# Patient Record
Sex: Male | Born: 1940 | Race: White | Hispanic: No | Marital: Married | Smoking: Former smoker
Health system: Southern US, Community
[De-identification: ages and names within clinical notes are randomized; demographics above are authoritative.]

## PROBLEM LIST (undated history)

## (undated) DIAGNOSIS — J330 Polyp of nasal cavity: Secondary | ICD-10-CM

## (undated) DIAGNOSIS — F419 Anxiety disorder, unspecified: Secondary | ICD-10-CM

## (undated) DIAGNOSIS — E785 Hyperlipidemia, unspecified: Secondary | ICD-10-CM

## (undated) DIAGNOSIS — J449 Chronic obstructive pulmonary disease, unspecified: Secondary | ICD-10-CM

## (undated) DIAGNOSIS — I2510 Atherosclerotic heart disease of native coronary artery without angina pectoris: Secondary | ICD-10-CM

## (undated) DIAGNOSIS — I739 Peripheral vascular disease, unspecified: Secondary | ICD-10-CM

## (undated) DIAGNOSIS — K219 Gastro-esophageal reflux disease without esophagitis: Secondary | ICD-10-CM

## (undated) DIAGNOSIS — I499 Cardiac arrhythmia, unspecified: Secondary | ICD-10-CM

## (undated) DIAGNOSIS — B029 Zoster without complications: Secondary | ICD-10-CM

## (undated) DIAGNOSIS — Z860101 Personal history of adenomatous and serrated colon polyps: Secondary | ICD-10-CM

## (undated) DIAGNOSIS — D509 Iron deficiency anemia, unspecified: Secondary | ICD-10-CM

## (undated) DIAGNOSIS — Z86010 Personal history of colonic polyps: Secondary | ICD-10-CM

## (undated) DIAGNOSIS — I6529 Occlusion and stenosis of unspecified carotid artery: Secondary | ICD-10-CM

## (undated) DIAGNOSIS — Z9981 Dependence on supplemental oxygen: Secondary | ICD-10-CM

## (undated) DIAGNOSIS — K922 Gastrointestinal hemorrhage, unspecified: Secondary | ICD-10-CM

## (undated) DIAGNOSIS — J920 Pleural plaque with presence of asbestos: Secondary | ICD-10-CM

## (undated) DIAGNOSIS — Z952 Presence of prosthetic heart valve: Secondary | ICD-10-CM

## (undated) DIAGNOSIS — Z87891 Personal history of nicotine dependence: Secondary | ICD-10-CM

## (undated) DIAGNOSIS — M1990 Unspecified osteoarthritis, unspecified site: Secondary | ICD-10-CM

## (undated) DIAGNOSIS — Z9289 Personal history of other medical treatment: Secondary | ICD-10-CM

## (undated) DIAGNOSIS — K5521 Angiodysplasia of colon with hemorrhage: Secondary | ICD-10-CM

## (undated) DIAGNOSIS — I10 Essential (primary) hypertension: Secondary | ICD-10-CM

## (undated) DIAGNOSIS — L719 Rosacea, unspecified: Secondary | ICD-10-CM

## (undated) HISTORY — DX: Polyp of nasal cavity: J33.0

## (undated) HISTORY — DX: Gastrointestinal hemorrhage, unspecified: K92.2

## (undated) HISTORY — DX: Peripheral vascular disease, unspecified: I73.9

## (undated) HISTORY — PX: CARDIAC CATHETERIZATION: SHX172

## (undated) HISTORY — DX: Gastro-esophageal reflux disease without esophagitis: K21.9

## (undated) HISTORY — DX: Chronic obstructive pulmonary disease, unspecified: J44.9

## (undated) HISTORY — DX: Zoster without complications: B02.9

## (undated) HISTORY — PX: TONSILLECTOMY: SUR1361

## (undated) HISTORY — DX: Occlusion and stenosis of unspecified carotid artery: I65.29

## (undated) HISTORY — DX: Cardiac arrhythmia, unspecified: I49.9

## (undated) HISTORY — DX: Unspecified osteoarthritis, unspecified site: M19.90

## (undated) HISTORY — DX: Essential (primary) hypertension: I10

## (undated) HISTORY — DX: Hyperlipidemia, unspecified: E78.5

## (undated) HISTORY — DX: Angiodysplasia of colon with hemorrhage: K55.21

## (undated) HISTORY — DX: Rosacea, unspecified: L71.9

## (undated) HISTORY — DX: Pleural plaque with presence of asbestos: J92.0

## (undated) HISTORY — DX: Iron deficiency anemia, unspecified: D50.9

## (undated) HISTORY — DX: Personal history of nicotine dependence: Z87.891

---

## 1998-08-02 ENCOUNTER — Ambulatory Visit (HOSPITAL_BASED_OUTPATIENT_CLINIC_OR_DEPARTMENT_OTHER): Admission: RE | Admit: 1998-08-02 | Discharge: 1998-08-02 | Payer: Self-pay | Admitting: Urology

## 1999-10-24 ENCOUNTER — Ambulatory Visit (HOSPITAL_COMMUNITY): Admission: RE | Admit: 1999-10-24 | Discharge: 1999-10-24 | Payer: Self-pay | Admitting: Cardiology

## 1999-12-05 ENCOUNTER — Ambulatory Visit (HOSPITAL_COMMUNITY): Admission: RE | Admit: 1999-12-05 | Discharge: 1999-12-05 | Payer: Self-pay | Admitting: *Deleted

## 1999-12-05 ENCOUNTER — Encounter (INDEPENDENT_AMBULATORY_CARE_PROVIDER_SITE_OTHER): Payer: Self-pay | Admitting: *Deleted

## 1999-12-05 ENCOUNTER — Encounter (INDEPENDENT_AMBULATORY_CARE_PROVIDER_SITE_OTHER): Payer: Self-pay

## 2001-11-15 ENCOUNTER — Ambulatory Visit: Admission: RE | Admit: 2001-11-15 | Discharge: 2001-11-15 | Payer: Self-pay | Admitting: Family Medicine

## 2002-06-09 ENCOUNTER — Ambulatory Visit (HOSPITAL_BASED_OUTPATIENT_CLINIC_OR_DEPARTMENT_OTHER): Admission: RE | Admit: 2002-06-09 | Discharge: 2002-06-09 | Payer: Self-pay | Admitting: *Deleted

## 2002-11-24 ENCOUNTER — Encounter: Payer: Self-pay | Admitting: Family Medicine

## 2002-11-24 ENCOUNTER — Encounter: Admission: RE | Admit: 2002-11-24 | Discharge: 2002-11-24 | Payer: Self-pay | Admitting: Family Medicine

## 2003-02-22 ENCOUNTER — Encounter (INDEPENDENT_AMBULATORY_CARE_PROVIDER_SITE_OTHER): Payer: Self-pay | Admitting: Specialist

## 2003-02-22 ENCOUNTER — Encounter (INDEPENDENT_AMBULATORY_CARE_PROVIDER_SITE_OTHER): Payer: Self-pay | Admitting: *Deleted

## 2003-02-22 ENCOUNTER — Ambulatory Visit (HOSPITAL_COMMUNITY): Admission: RE | Admit: 2003-02-22 | Discharge: 2003-02-22 | Payer: Self-pay | Admitting: *Deleted

## 2003-03-24 HISTORY — PX: ILIAC ARTERY STENT: SHX1786

## 2003-07-25 ENCOUNTER — Ambulatory Visit (HOSPITAL_COMMUNITY): Admission: RE | Admit: 2003-07-25 | Discharge: 2003-07-25 | Payer: Self-pay | Admitting: Vascular Surgery

## 2004-12-26 ENCOUNTER — Encounter: Admission: RE | Admit: 2004-12-26 | Discharge: 2004-12-26 | Payer: Self-pay | Admitting: Family Medicine

## 2005-11-27 ENCOUNTER — Ambulatory Visit: Payer: Self-pay | Admitting: Family Medicine

## 2005-12-04 ENCOUNTER — Encounter: Admission: RE | Admit: 2005-12-04 | Discharge: 2005-12-04 | Payer: Self-pay | Admitting: Family Medicine

## 2005-12-11 ENCOUNTER — Ambulatory Visit: Payer: Self-pay | Admitting: Family Medicine

## 2005-12-21 ENCOUNTER — Encounter: Admission: RE | Admit: 2005-12-21 | Discharge: 2005-12-21 | Payer: Self-pay | Admitting: Family Medicine

## 2006-02-26 ENCOUNTER — Ambulatory Visit: Payer: Self-pay | Admitting: Family Medicine

## 2006-02-26 LAB — CONVERTED CEMR LAB
ALT: 41 units/L — ABNORMAL HIGH (ref 0–40)
AST: 25 units/L (ref 0–37)
BUN: 10 mg/dL (ref 6–23)
CO2: 27 meq/L (ref 19–32)
Calcium: 9.8 mg/dL (ref 8.4–10.5)
Chloride: 101 meq/L (ref 96–112)
Chol/HDL Ratio, serum: 2.8
Cholesterol: 130 mg/dL (ref 0–200)
Creatinine, Ser: 0.8 mg/dL (ref 0.4–1.5)
GFR calc non Af Amer: 102 mL/min
Glomerular Filtration Rate, Af Am: 124 mL/min/{1.73_m2}
Glucose, Bld: 105 mg/dL — ABNORMAL HIGH (ref 70–99)
HDL: 46.1 mg/dL (ref >39.0)
LDL Cholesterol: 64 mg/dL (ref 0–99)
Potassium: 4.2 meq/L (ref 3.5–5.1)
Sodium: 135 meq/L (ref 135–145)
Triglyceride fasting, serum: 102 mg/dL (ref 0–149)
VLDL: 20 mg/dL (ref 0–40)

## 2006-04-16 ENCOUNTER — Ambulatory Visit: Payer: Self-pay | Admitting: Family Medicine

## 2006-04-16 LAB — CONVERTED CEMR LAB
HCT: 44.6 % (ref 39.0–52.0)
Hemoglobin: 15.4 g/dL (ref 13.0–17.0)
INR: 1 (ref 0.9–2.0)
MCHC: 34.4 g/dL (ref 30.0–36.0)
MCV: 95.2 fL (ref 78.0–100.0)
Platelets: 276 10*3/uL (ref 150–400)
Prothrombin Time: 12.5 s (ref 10.0–14.0)
RBC: 4.69 M/uL (ref 4.22–5.81)
RDW: 12.1 % (ref 11.5–14.6)
WBC: 9.1 10*3/uL (ref 4.5–10.5)

## 2006-05-25 ENCOUNTER — Ambulatory Visit: Payer: Self-pay | Admitting: Family Medicine

## 2006-06-02 ENCOUNTER — Ambulatory Visit: Payer: Self-pay | Admitting: Family Medicine

## 2006-06-08 ENCOUNTER — Ambulatory Visit: Payer: Self-pay | Admitting: Family Medicine

## 2006-06-25 ENCOUNTER — Encounter: Admission: RE | Admit: 2006-06-25 | Discharge: 2006-06-25 | Payer: Self-pay | Admitting: Family Medicine

## 2006-08-11 DIAGNOSIS — L719 Rosacea, unspecified: Secondary | ICD-10-CM

## 2006-08-11 DIAGNOSIS — J449 Chronic obstructive pulmonary disease, unspecified: Secondary | ICD-10-CM

## 2006-08-11 DIAGNOSIS — F528 Other sexual dysfunction not due to a substance or known physiological condition: Secondary | ICD-10-CM

## 2006-08-11 DIAGNOSIS — Z8719 Personal history of other diseases of the digestive system: Secondary | ICD-10-CM

## 2006-08-11 DIAGNOSIS — D126 Benign neoplasm of colon, unspecified: Secondary | ICD-10-CM

## 2006-08-11 DIAGNOSIS — F17210 Nicotine dependence, cigarettes, uncomplicated: Secondary | ICD-10-CM

## 2006-08-11 DIAGNOSIS — I10 Essential (primary) hypertension: Secondary | ICD-10-CM

## 2006-08-11 DIAGNOSIS — E785 Hyperlipidemia, unspecified: Secondary | ICD-10-CM | POA: Insufficient documentation

## 2006-08-11 HISTORY — DX: Chronic obstructive pulmonary disease, unspecified: J44.9

## 2006-10-01 ENCOUNTER — Telehealth (INDEPENDENT_AMBULATORY_CARE_PROVIDER_SITE_OTHER): Payer: Self-pay | Admitting: *Deleted

## 2006-10-01 ENCOUNTER — Ambulatory Visit: Payer: Self-pay | Admitting: Family Medicine

## 2006-10-01 LAB — CONVERTED CEMR LAB
ALT: 37 units/L (ref 0–53)
AST: 23 units/L (ref 0–37)
BUN: 9 mg/dL (ref 6–23)
CO2: 28 meq/L (ref 19–32)
Calcium: 10.1 mg/dL (ref 8.4–10.5)
Chloride: 99 meq/L (ref 96–112)
Cholesterol: 137 mg/dL (ref 0–200)
Creatinine, Ser: 0.6 mg/dL (ref 0.4–1.5)
GFR calc Af Amer: 172 mL/min
GFR calc non Af Amer: 142 mL/min
Glucose, Bld: 97 mg/dL (ref 70–99)
HDL: 55.1 mg/dL (ref >39.0)
LDL Cholesterol: 57 mg/dL (ref 0–99)
PSA: 0.92 ng/mL (ref 0.10–4.00)
Potassium: 4.2 meq/L (ref 3.5–5.1)
Sodium: 134 meq/L — ABNORMAL LOW (ref 135–145)
Total CHOL/HDL Ratio: 2.5
Triglycerides: 125 mg/dL (ref 0–149)
VLDL: 25 mg/dL (ref 0–40)

## 2006-11-05 ENCOUNTER — Ambulatory Visit: Payer: Self-pay | Admitting: Family Medicine

## 2006-12-17 ENCOUNTER — Ambulatory Visit: Payer: Self-pay | Admitting: Family Medicine

## 2007-01-28 ENCOUNTER — Telehealth (INDEPENDENT_AMBULATORY_CARE_PROVIDER_SITE_OTHER): Payer: Self-pay | Admitting: *Deleted

## 2007-01-28 ENCOUNTER — Encounter (INDEPENDENT_AMBULATORY_CARE_PROVIDER_SITE_OTHER): Payer: Self-pay | Admitting: *Deleted

## 2007-01-28 ENCOUNTER — Ambulatory Visit: Payer: Self-pay | Admitting: Family Medicine

## 2007-01-28 LAB — CONVERTED CEMR LAB
BUN: 13 mg/dL (ref 6–23)
CO2: 28 meq/L (ref 19–32)
Calcium: 9.7 mg/dL (ref 8.4–10.5)
Chloride: 98 meq/L (ref 96–112)
Creatinine, Ser: 0.7 mg/dL (ref 0.4–1.5)
GFR calc Af Amer: 144 mL/min
GFR calc non Af Amer: 119 mL/min
Glucose, Bld: 95 mg/dL (ref 70–99)
Potassium: 3.9 meq/L (ref 3.5–5.1)
Sodium: 133 meq/L — ABNORMAL LOW (ref 135–145)

## 2007-06-03 ENCOUNTER — Ambulatory Visit: Payer: Self-pay | Admitting: Family Medicine

## 2007-06-03 LAB — CONVERTED CEMR LAB
ALT: 38 units/L (ref 0–53)
AST: 25 units/L (ref 0–37)
BUN: 9 mg/dL (ref 6–23)
CO2: 27 meq/L (ref 19–32)
Calcium: 9.9 mg/dL (ref 8.4–10.5)
Chloride: 104 meq/L (ref 96–112)
Cholesterol: 126 mg/dL (ref 0–200)
Creatinine, Ser: 0.7 mg/dL (ref 0.4–1.5)
GFR calc Af Amer: 144 mL/min
GFR calc non Af Amer: 119 mL/min
Glucose, Bld: 94 mg/dL (ref 70–99)
HDL: 46.2 mg/dL (ref >39.0)
LDL Cholesterol: 62 mg/dL (ref 0–99)
Potassium: 4.4 meq/L (ref 3.5–5.1)
Sodium: 137 meq/L (ref 135–145)
Total CHOL/HDL Ratio: 2.7
Triglycerides: 90 mg/dL (ref 0–149)
VLDL: 18 mg/dL (ref 0–40)

## 2007-06-06 ENCOUNTER — Encounter (INDEPENDENT_AMBULATORY_CARE_PROVIDER_SITE_OTHER): Payer: Self-pay | Admitting: *Deleted

## 2007-06-22 ENCOUNTER — Ambulatory Visit: Payer: Self-pay | Admitting: Family Medicine

## 2007-06-22 DIAGNOSIS — M542 Cervicalgia: Secondary | ICD-10-CM

## 2007-06-24 ENCOUNTER — Encounter: Admission: RE | Admit: 2007-06-24 | Discharge: 2007-06-24 | Payer: Self-pay | Admitting: Family Medicine

## 2007-06-24 DIAGNOSIS — I7789 Other specified disorders of arteries and arterioles: Secondary | ICD-10-CM

## 2007-06-27 ENCOUNTER — Telehealth (INDEPENDENT_AMBULATORY_CARE_PROVIDER_SITE_OTHER): Payer: Self-pay | Admitting: *Deleted

## 2007-06-27 ENCOUNTER — Encounter (INDEPENDENT_AMBULATORY_CARE_PROVIDER_SITE_OTHER): Payer: Self-pay | Admitting: *Deleted

## 2007-07-04 ENCOUNTER — Telehealth (INDEPENDENT_AMBULATORY_CARE_PROVIDER_SITE_OTHER): Payer: Self-pay | Admitting: *Deleted

## 2007-07-06 ENCOUNTER — Ambulatory Visit: Payer: Self-pay

## 2007-07-06 ENCOUNTER — Encounter: Payer: Self-pay | Admitting: Family Medicine

## 2007-07-12 ENCOUNTER — Encounter: Payer: Self-pay | Admitting: Family Medicine

## 2007-07-14 ENCOUNTER — Encounter (INDEPENDENT_AMBULATORY_CARE_PROVIDER_SITE_OTHER): Payer: Self-pay | Admitting: *Deleted

## 2007-07-25 ENCOUNTER — Telehealth (INDEPENDENT_AMBULATORY_CARE_PROVIDER_SITE_OTHER): Payer: Self-pay | Admitting: *Deleted

## 2007-07-25 DIAGNOSIS — J330 Polyp of nasal cavity: Secondary | ICD-10-CM | POA: Insufficient documentation

## 2007-08-01 ENCOUNTER — Encounter: Payer: Self-pay | Admitting: Family Medicine

## 2007-08-17 ENCOUNTER — Encounter: Payer: Self-pay | Admitting: Family Medicine

## 2007-11-24 ENCOUNTER — Encounter: Payer: Self-pay | Admitting: Family Medicine

## 2007-11-25 ENCOUNTER — Ambulatory Visit: Payer: Self-pay | Admitting: Family Medicine

## 2007-11-28 LAB — CONVERTED CEMR LAB
ALT: 38 units/L (ref 0–53)
AST: 23 units/L (ref 0–37)
Albumin: 3.8 g/dL (ref 3.5–5.2)
Alkaline Phosphatase: 62 units/L (ref 39–117)
BUN: 10 mg/dL (ref 6–23)
Bilirubin, Direct: 0.1 mg/dL (ref 0.0–0.3)
CO2: 28 meq/L (ref 19–32)
Calcium: 9.6 mg/dL (ref 8.4–10.5)
Chloride: 105 meq/L (ref 96–112)
Cholesterol: 118 mg/dL (ref 0–200)
Creatinine, Ser: 0.7 mg/dL (ref 0.4–1.5)
GFR calc Af Amer: 144 mL/min
GFR calc non Af Amer: 119 mL/min
Glucose, Bld: 81 mg/dL (ref 70–99)
HDL: 43.4 mg/dL (ref >39.0)
LDL Cholesterol: 60 mg/dL (ref 0–99)
Potassium: 4.3 meq/L (ref 3.5–5.1)
Sodium: 136 meq/L (ref 135–145)
Total Bilirubin: 0.7 mg/dL (ref 0.3–1.2)
Total CHOL/HDL Ratio: 2.7
Total Protein: 7.4 g/dL (ref 6.0–8.3)
Triglycerides: 74 mg/dL (ref 0–149)
VLDL: 15 mg/dL (ref 0–40)

## 2007-11-29 ENCOUNTER — Encounter (INDEPENDENT_AMBULATORY_CARE_PROVIDER_SITE_OTHER): Payer: Self-pay | Admitting: *Deleted

## 2007-12-02 ENCOUNTER — Telehealth: Payer: Self-pay | Admitting: Family Medicine

## 2007-12-14 ENCOUNTER — Ambulatory Visit: Payer: Self-pay | Admitting: Family Medicine

## 2008-03-23 DIAGNOSIS — K922 Gastrointestinal hemorrhage, unspecified: Secondary | ICD-10-CM

## 2008-03-23 HISTORY — DX: Gastrointestinal hemorrhage, unspecified: K92.2

## 2008-05-25 ENCOUNTER — Ambulatory Visit: Payer: Self-pay | Admitting: Family Medicine

## 2008-05-25 ENCOUNTER — Encounter (INDEPENDENT_AMBULATORY_CARE_PROVIDER_SITE_OTHER): Payer: Self-pay | Admitting: *Deleted

## 2008-05-25 DIAGNOSIS — R0989 Other specified symptoms and signs involving the circulatory and respiratory systems: Secondary | ICD-10-CM

## 2008-06-01 ENCOUNTER — Telehealth (INDEPENDENT_AMBULATORY_CARE_PROVIDER_SITE_OTHER): Payer: Self-pay | Admitting: *Deleted

## 2008-06-01 ENCOUNTER — Encounter: Admission: RE | Admit: 2008-06-01 | Discharge: 2008-06-01 | Payer: Self-pay | Admitting: Family Medicine

## 2008-06-15 ENCOUNTER — Telehealth (INDEPENDENT_AMBULATORY_CARE_PROVIDER_SITE_OTHER): Payer: Self-pay | Admitting: *Deleted

## 2008-06-16 LAB — CONVERTED CEMR LAB
ALT: 40 units/L (ref 0–53)
AST: 23 units/L (ref 0–37)
Albumin: 3.9 g/dL (ref 3.5–5.2)
Alkaline Phosphatase: 68 units/L (ref 39–117)
BUN: 13 mg/dL (ref 6–23)
Basophils Absolute: 0 10*3/uL (ref 0.0–0.1)
Basophils Relative: 0.1 % (ref 0.0–3.0)
Bilirubin, Direct: 0.1 mg/dL (ref 0.0–0.3)
CO2: 27 meq/L (ref 19–32)
Calcium: 9.7 mg/dL (ref 8.4–10.5)
Chloride: 101 meq/L (ref 96–112)
Cholesterol: 126 mg/dL (ref 0–200)
Creatinine, Ser: 0.6 mg/dL (ref 0.4–1.5)
Eosinophils Absolute: 0.3 10*3/uL (ref 0.0–0.7)
Eosinophils Relative: 3.2 % (ref 0.0–5.0)
GFR calc Af Amer: 172 mL/min
GFR calc non Af Amer: 142 mL/min
Glucose, Bld: 89 mg/dL (ref 70–99)
HCT: 35.2 % — ABNORMAL LOW (ref 39.0–52.0)
HDL: 49.2 mg/dL (ref >39.0)
Hemoglobin: 11.4 g/dL — ABNORMAL LOW (ref 13.0–17.0)
LDL Cholesterol: 61 mg/dL (ref 0–99)
Lymphocytes Relative: 14.9 % (ref 12.0–46.0)
MCHC: 32.3 g/dL (ref 30.0–36.0)
MCV: 78.9 fL (ref 78.0–100.0)
Monocytes Absolute: 1.2 10*3/uL — ABNORMAL HIGH (ref 0.1–1.0)
Monocytes Relative: 11.9 % (ref 3.0–12.0)
Neutro Abs: 7.1 10*3/uL (ref 1.4–7.7)
Neutrophils Relative %: 69.9 % (ref 43.0–77.0)
PSA: 1.03 ng/mL (ref 0.10–4.00)
Platelets: 329 10*3/uL (ref 150–400)
Potassium: 4.4 meq/L (ref 3.5–5.1)
RBC: 4.46 M/uL (ref 4.22–5.81)
RDW: 18.3 % — ABNORMAL HIGH (ref 11.5–14.6)
Sodium: 135 meq/L (ref 135–145)
Total Bilirubin: 0.8 mg/dL (ref 0.3–1.2)
Total CHOL/HDL Ratio: 2.6
Total Protein: 7.6 g/dL (ref 6.0–8.3)
Triglycerides: 78 mg/dL (ref 0–149)
VLDL: 16 mg/dL (ref 0–40)
WBC: 10.1 10*3/uL (ref 4.5–10.5)

## 2008-06-19 ENCOUNTER — Encounter (INDEPENDENT_AMBULATORY_CARE_PROVIDER_SITE_OTHER): Payer: Self-pay | Admitting: *Deleted

## 2008-07-04 ENCOUNTER — Encounter: Payer: Self-pay | Admitting: Family Medicine

## 2008-07-04 ENCOUNTER — Ambulatory Visit: Payer: Self-pay

## 2008-07-12 ENCOUNTER — Encounter (INDEPENDENT_AMBULATORY_CARE_PROVIDER_SITE_OTHER): Payer: Self-pay | Admitting: *Deleted

## 2008-09-21 ENCOUNTER — Ambulatory Visit: Payer: Self-pay | Admitting: Family Medicine

## 2008-09-26 ENCOUNTER — Telehealth (INDEPENDENT_AMBULATORY_CARE_PROVIDER_SITE_OTHER): Payer: Self-pay | Admitting: *Deleted

## 2008-09-28 ENCOUNTER — Encounter: Payer: Self-pay | Admitting: Family Medicine

## 2008-09-30 LAB — CONVERTED CEMR LAB
ALT: 35 units/L (ref 0–53)
AST: 27 units/L (ref 0–37)
Albumin: 3.8 g/dL (ref 3.5–5.2)
Alkaline Phosphatase: 53 units/L (ref 39–117)
Basophils Absolute: 0 10*3/uL (ref 0.0–0.1)
Basophils Relative: 0 % (ref 0.0–3.0)
Bilirubin, Direct: 0 mg/dL (ref 0.0–0.3)
Cholesterol: 149 mg/dL (ref 0–200)
Eosinophils Absolute: 0.5 10*3/uL (ref 0.0–0.7)
Eosinophils Relative: 6.3 % — ABNORMAL HIGH (ref 0.0–5.0)
HCT: 36.1 % — ABNORMAL LOW (ref 39.0–52.0)
HDL: 49.4 mg/dL (ref >39.00)
Hemoglobin: 11.6 g/dL — ABNORMAL LOW (ref 13.0–17.0)
Iron: 18 ug/dL — ABNORMAL LOW (ref 42–165)
LDL Cholesterol: 81 mg/dL (ref 0–99)
Lymphocytes Relative: 17.2 % (ref 12.0–46.0)
Lymphs Abs: 1.5 10*3/uL (ref 0.7–4.0)
MCHC: 32 g/dL (ref 30.0–36.0)
MCV: 81.3 fL (ref 78.0–100.0)
Monocytes Absolute: 1.1 10*3/uL — ABNORMAL HIGH (ref 0.1–1.0)
Monocytes Relative: 13 % — ABNORMAL HIGH (ref 3.0–12.0)
Neutro Abs: 5.4 10*3/uL (ref 1.4–7.7)
Neutrophils Relative %: 63.5 % (ref 43.0–77.0)
Platelets: 318 10*3/uL (ref 150.0–400.0)
RBC: 4.44 M/uL (ref 4.22–5.81)
RDW: 16.9 % — ABNORMAL HIGH (ref 11.5–14.6)
Saturation Ratios: 3 % — ABNORMAL LOW (ref 20.0–50.0)
Total Bilirubin: 0.6 mg/dL (ref 0.3–1.2)
Total CHOL/HDL Ratio: 3
Total Protein: 7.8 g/dL (ref 6.0–8.3)
Transferrin: 424.6 mg/dL — ABNORMAL HIGH (ref 212.0–360.0)
Triglycerides: 91 mg/dL (ref 0.0–149.0)
VLDL: 18.2 mg/dL (ref 0.0–40.0)
Vitamin B-12: 411 pg/mL (ref 211–911)
WBC: 8.5 10*3/uL (ref 4.5–10.5)

## 2008-10-02 ENCOUNTER — Encounter (INDEPENDENT_AMBULATORY_CARE_PROVIDER_SITE_OTHER): Payer: Self-pay | Admitting: *Deleted

## 2008-10-09 ENCOUNTER — Encounter: Payer: Self-pay | Admitting: Family Medicine

## 2008-10-09 ENCOUNTER — Encounter (INDEPENDENT_AMBULATORY_CARE_PROVIDER_SITE_OTHER): Payer: Self-pay | Admitting: *Deleted

## 2008-10-09 ENCOUNTER — Ambulatory Visit (HOSPITAL_COMMUNITY): Admission: RE | Admit: 2008-10-09 | Discharge: 2008-10-09 | Payer: Self-pay | Admitting: *Deleted

## 2008-11-30 ENCOUNTER — Telehealth: Payer: Self-pay | Admitting: Family Medicine

## 2008-11-30 ENCOUNTER — Ambulatory Visit: Payer: Self-pay | Admitting: Family Medicine

## 2008-11-30 DIAGNOSIS — K219 Gastro-esophageal reflux disease without esophagitis: Secondary | ICD-10-CM

## 2008-11-30 HISTORY — DX: Gastro-esophageal reflux disease without esophagitis: K21.9

## 2008-12-01 ENCOUNTER — Telehealth: Payer: Self-pay | Admitting: Family Medicine

## 2008-12-01 ENCOUNTER — Ambulatory Visit: Payer: Self-pay | Admitting: Family Medicine

## 2008-12-01 DIAGNOSIS — D649 Anemia, unspecified: Secondary | ICD-10-CM

## 2008-12-03 ENCOUNTER — Ambulatory Visit: Payer: Self-pay | Admitting: Internal Medicine

## 2008-12-03 ENCOUNTER — Inpatient Hospital Stay (HOSPITAL_COMMUNITY): Admission: AD | Admit: 2008-12-03 | Discharge: 2008-12-06 | Payer: Self-pay | Admitting: Family Medicine

## 2008-12-03 ENCOUNTER — Ambulatory Visit: Payer: Self-pay | Admitting: Family Medicine

## 2008-12-03 ENCOUNTER — Encounter (INDEPENDENT_AMBULATORY_CARE_PROVIDER_SITE_OTHER): Payer: Self-pay | Admitting: *Deleted

## 2008-12-03 ENCOUNTER — Ambulatory Visit: Payer: Self-pay | Admitting: Cardiovascular Disease

## 2008-12-03 DIAGNOSIS — D500 Iron deficiency anemia secondary to blood loss (chronic): Secondary | ICD-10-CM

## 2008-12-03 DIAGNOSIS — K922 Gastrointestinal hemorrhage, unspecified: Secondary | ICD-10-CM

## 2008-12-05 ENCOUNTER — Encounter: Payer: Self-pay | Admitting: Internal Medicine

## 2008-12-07 ENCOUNTER — Encounter: Payer: Self-pay | Admitting: Internal Medicine

## 2008-12-14 ENCOUNTER — Ambulatory Visit: Payer: Self-pay | Admitting: Family Medicine

## 2008-12-18 ENCOUNTER — Telehealth: Payer: Self-pay | Admitting: Family Medicine

## 2008-12-18 LAB — CONVERTED CEMR LAB
Basophils Absolute: 0 10*3/uL (ref 0.0–0.1)
Basophils Relative: 0.1 % (ref 0.0–3.0)
Eosinophils Absolute: 0.4 10*3/uL (ref 0.0–0.7)
Eosinophils Relative: 4.3 % (ref 0.0–5.0)
HCT: 37.4 % — ABNORMAL LOW (ref 39.0–52.0)
Hemoglobin: 12.1 g/dL — ABNORMAL LOW (ref 13.0–17.0)
Lymphocytes Relative: 12.6 % (ref 12.0–46.0)
Lymphs Abs: 1.1 10*3/uL (ref 0.7–4.0)
MCHC: 32.3 g/dL (ref 30.0–36.0)
MCV: 82 fL (ref 78.0–100.0)
Monocytes Absolute: 1 10*3/uL (ref 0.1–1.0)
Monocytes Relative: 11.3 % (ref 3.0–12.0)
Neutro Abs: 6.1 10*3/uL (ref 1.4–7.7)
Neutrophils Relative %: 71.7 % (ref 43.0–77.0)
Platelets: 417 10*3/uL — ABNORMAL HIGH (ref 150.0–400.0)
RBC: 4.56 M/uL (ref 4.22–5.81)
RDW: 20.4 % — ABNORMAL HIGH (ref 11.5–14.6)
WBC: 8.6 10*3/uL (ref 4.5–10.5)

## 2008-12-21 ENCOUNTER — Ambulatory Visit: Payer: Self-pay | Admitting: Family Medicine

## 2008-12-26 LAB — CONVERTED CEMR LAB
Basophils Absolute: 0 10*3/uL (ref 0.0–0.1)
Basophils Relative: 0.1 % (ref 0.0–3.0)
Eosinophils Absolute: 0.5 10*3/uL (ref 0.0–0.7)
Eosinophils Relative: 5.5 % — ABNORMAL HIGH (ref 0.0–5.0)
HCT: 36.8 % — ABNORMAL LOW (ref 39.0–52.0)
Hemoglobin: 12 g/dL — ABNORMAL LOW (ref 13.0–17.0)
Lymphocytes Relative: 14 % (ref 12.0–46.0)
Lymphs Abs: 1.4 10*3/uL (ref 0.7–4.0)
MCHC: 32.8 g/dL (ref 30.0–36.0)
MCV: 84 fL (ref 78.0–100.0)
Monocytes Absolute: 0.9 10*3/uL (ref 0.1–1.0)
Monocytes Relative: 9.1 % (ref 3.0–12.0)
Neutro Abs: 7.1 10*3/uL (ref 1.4–7.7)
Neutrophils Relative %: 71.3 % (ref 43.0–77.0)
Platelets: 466 10*3/uL — ABNORMAL HIGH (ref 150.0–400.0)
RBC: 4.38 M/uL (ref 4.22–5.81)
RDW: 21.8 % — ABNORMAL HIGH (ref 11.5–14.6)
WBC: 9.9 10*3/uL (ref 4.5–10.5)

## 2009-01-04 ENCOUNTER — Ambulatory Visit: Payer: Self-pay | Admitting: Family Medicine

## 2009-01-04 LAB — CONVERTED CEMR LAB
Basophils Absolute: 0 10*3/uL (ref 0.0–0.1)
Basophils Relative: 0.1 % (ref 0.0–3.0)
Eosinophils Absolute: 0.6 10*3/uL (ref 0.0–0.7)
Eosinophils Relative: 7.9 % — ABNORMAL HIGH (ref 0.0–5.0)
HCT: 38.4 % — ABNORMAL LOW (ref 39.0–52.0)
Hemoglobin: 12.7 g/dL — ABNORMAL LOW (ref 13.0–17.0)
Lymphocytes Relative: 14 % (ref 12.0–46.0)
Lymphs Abs: 1.1 10*3/uL (ref 0.7–4.0)
MCHC: 33.1 g/dL (ref 30.0–36.0)
MCV: 87.5 fL (ref 78.0–100.0)
Monocytes Absolute: 0.7 10*3/uL (ref 0.1–1.0)
Monocytes Relative: 9 % (ref 3.0–12.0)
Neutro Abs: 5.6 10*3/uL (ref 1.4–7.7)
Neutrophils Relative %: 69 % (ref 43.0–77.0)
Platelets: 199 10*3/uL (ref 150.0–400.0)
RBC: 4.39 M/uL (ref 4.22–5.81)
RDW: 25.1 % — ABNORMAL HIGH (ref 11.5–14.6)
WBC: 8 10*3/uL (ref 4.5–10.5)

## 2009-01-18 ENCOUNTER — Ambulatory Visit: Payer: Self-pay | Admitting: Family Medicine

## 2009-01-21 ENCOUNTER — Encounter (INDEPENDENT_AMBULATORY_CARE_PROVIDER_SITE_OTHER): Payer: Self-pay | Admitting: *Deleted

## 2009-01-21 LAB — CONVERTED CEMR LAB
Basophils Absolute: 0 10*3/uL (ref 0.0–0.1)
Basophils Relative: 0 % (ref 0.0–3.0)
Eosinophils Absolute: 0.6 10*3/uL (ref 0.0–0.7)
Eosinophils Relative: 6 % — ABNORMAL HIGH (ref 0.0–5.0)
HCT: 38.6 % — ABNORMAL LOW (ref 39.0–52.0)
Hemoglobin: 13.3 g/dL (ref 13.0–17.0)
Lymphocytes Relative: 13 % (ref 12.0–46.0)
Lymphs Abs: 1.3 10*3/uL (ref 0.7–4.0)
MCHC: 34.5 g/dL (ref 30.0–36.0)
MCV: 91.2 fL (ref 78.0–100.0)
Monocytes Absolute: 0.9 10*3/uL (ref 0.1–1.0)
Monocytes Relative: 9.1 % (ref 3.0–12.0)
Neutro Abs: 7.2 10*3/uL (ref 1.4–7.7)
Neutrophils Relative %: 71.9 % (ref 43.0–77.0)
Platelets: 300 10*3/uL (ref 150.0–400.0)
RBC: 4.23 M/uL (ref 4.22–5.81)
RDW: 25 % — ABNORMAL HIGH (ref 11.5–14.6)
WBC: 10 10*3/uL (ref 4.5–10.5)

## 2009-01-25 ENCOUNTER — Ambulatory Visit: Payer: Self-pay | Admitting: Internal Medicine

## 2009-01-25 DIAGNOSIS — K31819 Angiodysplasia of stomach and duodenum without bleeding: Secondary | ICD-10-CM | POA: Insufficient documentation

## 2009-01-25 DIAGNOSIS — Z860100 Personal history of colon polyps, unspecified: Secondary | ICD-10-CM | POA: Insufficient documentation

## 2009-01-25 DIAGNOSIS — D509 Iron deficiency anemia, unspecified: Secondary | ICD-10-CM | POA: Insufficient documentation

## 2009-01-25 DIAGNOSIS — K5520 Angiodysplasia of colon without hemorrhage: Secondary | ICD-10-CM | POA: Insufficient documentation

## 2009-01-25 HISTORY — DX: Iron deficiency anemia, unspecified: D50.9

## 2009-02-22 ENCOUNTER — Ambulatory Visit: Payer: Self-pay | Admitting: Family Medicine

## 2009-02-25 LAB — CONVERTED CEMR LAB
Basophils Absolute: 0 10*3/uL (ref 0.0–0.1)
Basophils Relative: 0.1 % (ref 0.0–3.0)
Eosinophils Absolute: 0.5 10*3/uL (ref 0.0–0.7)
Eosinophils Relative: 4.9 % (ref 0.0–5.0)
HCT: 41.5 % (ref 39.0–52.0)
Hemoglobin: 13.9 g/dL (ref 13.0–17.0)
Lymphocytes Relative: 13.8 % (ref 12.0–46.0)
Lymphs Abs: 1.3 10*3/uL (ref 0.7–4.0)
MCHC: 33.6 g/dL (ref 30.0–36.0)
MCV: 97.3 fL (ref 78.0–100.0)
Monocytes Absolute: 1 10*3/uL (ref 0.1–1.0)
Monocytes Relative: 10.4 % (ref 3.0–12.0)
Neutro Abs: 6.8 10*3/uL (ref 1.4–7.7)
Neutrophils Relative %: 70.8 % (ref 43.0–77.0)
Platelets: 278 10*3/uL (ref 150.0–400.0)
RBC: 4.27 M/uL (ref 4.22–5.81)
RDW: 17.2 % — ABNORMAL HIGH (ref 11.5–14.6)
WBC: 9.6 10*3/uL (ref 4.5–10.5)

## 2009-04-05 ENCOUNTER — Encounter (INDEPENDENT_AMBULATORY_CARE_PROVIDER_SITE_OTHER): Payer: Self-pay | Admitting: *Deleted

## 2009-04-25 ENCOUNTER — Encounter (INDEPENDENT_AMBULATORY_CARE_PROVIDER_SITE_OTHER): Payer: Self-pay | Admitting: *Deleted

## 2009-04-26 ENCOUNTER — Ambulatory Visit: Payer: Self-pay | Admitting: Internal Medicine

## 2009-05-03 ENCOUNTER — Ambulatory Visit: Payer: Self-pay | Admitting: Internal Medicine

## 2009-05-03 LAB — HM COLONOSCOPY

## 2009-05-31 ENCOUNTER — Ambulatory Visit: Payer: Self-pay | Admitting: Family Medicine

## 2009-06-10 LAB — CONVERTED CEMR LAB
ALT: 51 units/L (ref 0–53)
AST: 32 units/L (ref 0–37)
Albumin: 4.2 g/dL (ref 3.5–5.2)
Alkaline Phosphatase: 64 units/L (ref 39–117)
BUN: 10 mg/dL (ref 6–23)
Basophils Absolute: 0 10*3/uL (ref 0.0–0.1)
Basophils Relative: 0.1 % (ref 0.0–3.0)
Bilirubin, Direct: 0.1 mg/dL (ref 0.0–0.3)
CO2: 28 meq/L (ref 19–32)
Calcium: 9.9 mg/dL (ref 8.4–10.5)
Chloride: 103 meq/L (ref 96–112)
Cholesterol: 128 mg/dL (ref 0–200)
Creatinine, Ser: 0.6 mg/dL (ref 0.4–1.5)
Eosinophils Absolute: 0.4 10*3/uL (ref 0.0–0.7)
Eosinophils Relative: 4.2 % (ref 0.0–5.0)
Ferritin: 61.6 ng/mL (ref 22.0–322.0)
Folate: >20 ng/mL
GFR calc non Af Amer: 141.25 mL/min (ref >60)
Glucose, Bld: 92 mg/dL (ref 70–99)
HCT: 44.7 % (ref 39.0–52.0)
HDL: 56.3 mg/dL (ref >39.00)
Hemoglobin: 15.2 g/dL (ref 13.0–17.0)
Iron: 122 ug/dL (ref 42–165)
LDL Cholesterol: 47 mg/dL (ref 0–99)
Lymphocytes Relative: 12.4 % (ref 12.0–46.0)
Lymphs Abs: 1.2 10*3/uL (ref 0.7–4.0)
MCHC: 33.9 g/dL (ref 30.0–36.0)
MCV: 99.1 fL (ref 78.0–100.0)
Monocytes Absolute: 1.1 10*3/uL — ABNORMAL HIGH (ref 0.1–1.0)
Monocytes Relative: 11.6 % (ref 3.0–12.0)
Neutro Abs: 6.7 10*3/uL (ref 1.4–7.7)
Neutrophils Relative %: 71.7 % (ref 43.0–77.0)
Platelets: 246 10*3/uL (ref 150.0–400.0)
Potassium: 4.4 meq/L (ref 3.5–5.1)
RBC: 4.52 M/uL (ref 4.22–5.81)
RDW: 12.5 % (ref 11.5–14.6)
Saturation Ratios: 27 % (ref 20.0–50.0)
Sodium: 134 meq/L — ABNORMAL LOW (ref 135–145)
Total Bilirubin: 0.7 mg/dL (ref 0.3–1.2)
Total CHOL/HDL Ratio: 2
Total Protein: 8.1 g/dL (ref 6.0–8.3)
Transferrin: 322.6 mg/dL (ref 212.0–360.0)
Triglycerides: 125 mg/dL (ref 0.0–149.0)
VLDL: 25 mg/dL (ref 0.0–40.0)
Vitamin B-12: 363 pg/mL (ref 211–911)
WBC: 9.4 10*3/uL (ref 4.5–10.5)

## 2009-07-18 ENCOUNTER — Encounter: Payer: Self-pay | Admitting: Family Medicine

## 2009-07-19 ENCOUNTER — Ambulatory Visit: Payer: Self-pay

## 2009-07-19 ENCOUNTER — Encounter: Payer: Self-pay | Admitting: Family Medicine

## 2009-07-23 ENCOUNTER — Telehealth (INDEPENDENT_AMBULATORY_CARE_PROVIDER_SITE_OTHER): Payer: Self-pay | Admitting: *Deleted

## 2009-11-29 ENCOUNTER — Ambulatory Visit: Payer: Self-pay | Admitting: Family Medicine

## 2009-12-05 LAB — CONVERTED CEMR LAB
ALT: 50 units/L (ref 0–53)
AST: 30 units/L (ref 0–37)
Albumin: 4.3 g/dL (ref 3.5–5.2)
Alkaline Phosphatase: 59 units/L (ref 39–117)
BUN: 12 mg/dL (ref 6–23)
Basophils Absolute: 0.1 10*3/uL (ref 0.0–0.1)
Basophils Relative: 0.6 % (ref 0.0–3.0)
Bilirubin, Direct: 0.1 mg/dL (ref 0.0–0.3)
CO2: 25 meq/L (ref 19–32)
Calcium: 10.5 mg/dL (ref 8.4–10.5)
Chloride: 100 meq/L (ref 96–112)
Cholesterol: 139 mg/dL (ref 0–200)
Creatinine, Ser: 0.6 mg/dL (ref 0.4–1.5)
Eosinophils Absolute: 0.4 10*3/uL (ref 0.0–0.7)
Eosinophils Relative: 3.2 % (ref 0.0–5.0)
Ferritin: 32 ng/mL (ref 22.0–322.0)
Folate: >20 ng/mL
GFR calc non Af Amer: 152.73 mL/min (ref >60)
Glucose, Bld: 85 mg/dL (ref 70–99)
HCT: 44 % (ref 39.0–52.0)
HDL: 47.6 mg/dL (ref >39.00)
Hemoglobin: 15.1 g/dL (ref 13.0–17.0)
Iron: 64 ug/dL (ref 42–165)
LDL Cholesterol: 67 mg/dL (ref 0–99)
Lymphocytes Relative: 11.8 % — ABNORMAL LOW (ref 12.0–46.0)
Lymphs Abs: 1.4 10*3/uL (ref 0.7–4.0)
MCHC: 34.3 g/dL (ref 30.0–36.0)
MCV: 99.7 fL (ref 78.0–100.0)
Monocytes Absolute: 1.2 10*3/uL — ABNORMAL HIGH (ref 0.1–1.0)
Monocytes Relative: 10.2 % (ref 3.0–12.0)
Neutro Abs: 8.8 10*3/uL — ABNORMAL HIGH (ref 1.4–7.7)
Neutrophils Relative %: 74.2 % (ref 43.0–77.0)
PSA: 0.97 ng/mL (ref 0.10–4.00)
Platelets: 273 10*3/uL (ref 150.0–400.0)
Potassium: 4.6 meq/L (ref 3.5–5.1)
RBC: 4.42 M/uL (ref 4.22–5.81)
RDW: 13.2 % (ref 11.5–14.6)
Saturation Ratios: 12.8 % — ABNORMAL LOW (ref 20.0–50.0)
Sodium: 135 meq/L (ref 135–145)
Total Bilirubin: 0.5 mg/dL (ref 0.3–1.2)
Total CHOL/HDL Ratio: 3
Total Protein: 7.4 g/dL (ref 6.0–8.3)
Transferrin: 355.8 mg/dL (ref 212.0–360.0)
Triglycerides: 124 mg/dL (ref 0.0–149.0)
VLDL: 24.8 mg/dL (ref 0.0–40.0)
Vitamin B-12: 467 pg/mL (ref 211–911)
WBC: 11.8 10*3/uL — ABNORMAL HIGH (ref 4.5–10.5)

## 2010-01-17 ENCOUNTER — Encounter: Payer: Self-pay | Admitting: Family Medicine

## 2010-01-17 ENCOUNTER — Telehealth (INDEPENDENT_AMBULATORY_CARE_PROVIDER_SITE_OTHER): Payer: Self-pay | Admitting: *Deleted

## 2010-01-17 ENCOUNTER — Ambulatory Visit: Payer: Self-pay | Admitting: Family Medicine

## 2010-01-17 ENCOUNTER — Encounter (INDEPENDENT_AMBULATORY_CARE_PROVIDER_SITE_OTHER): Payer: Self-pay | Admitting: *Deleted

## 2010-01-17 DIAGNOSIS — D7289 Other specified disorders of white blood cells: Secondary | ICD-10-CM

## 2010-01-17 DIAGNOSIS — K921 Melena: Secondary | ICD-10-CM

## 2010-01-20 LAB — CONVERTED CEMR LAB
Basophils Absolute: 0.1 10*3/uL (ref 0.0–0.1)
Eosinophils Absolute: 0.3 10*3/uL (ref 0.0–0.7)
Eosinophils Relative: 3.1 % (ref 0.0–5.0)
Hemoglobin: 15.3 g/dL (ref 13.0–17.0)
Lymphs Abs: 1.2 10*3/uL (ref 0.7–4.0)
Monocytes Absolute: 1.1 10*3/uL — ABNORMAL HIGH (ref 0.1–1.0)
Monocytes Relative: 11.5 % (ref 3.0–12.0)
Neutrophils Relative %: 72.9 % (ref 43.0–77.0)
Platelets: 253 10*3/uL (ref 150.0–400.0)
RBC: 4.4 M/uL (ref 4.22–5.81)
RDW: 13.7 % (ref 11.5–14.6)
WBC: 9.9 10*3/uL (ref 4.5–10.5)

## 2010-01-24 ENCOUNTER — Ambulatory Visit: Payer: Self-pay

## 2010-01-24 ENCOUNTER — Encounter: Payer: Self-pay | Admitting: Family Medicine

## 2010-02-27 ENCOUNTER — Telehealth (INDEPENDENT_AMBULATORY_CARE_PROVIDER_SITE_OTHER): Payer: Self-pay | Admitting: *Deleted

## 2010-03-11 ENCOUNTER — Encounter (INDEPENDENT_AMBULATORY_CARE_PROVIDER_SITE_OTHER): Payer: Self-pay | Admitting: *Deleted

## 2010-03-14 ENCOUNTER — Ambulatory Visit: Payer: Self-pay | Admitting: Internal Medicine

## 2010-03-14 ENCOUNTER — Telehealth: Payer: Self-pay | Admitting: Internal Medicine

## 2010-03-21 ENCOUNTER — Ambulatory Visit: Payer: Self-pay | Admitting: Internal Medicine

## 2010-03-23 HISTORY — PX: ESOPHAGOGASTRODUODENOSCOPY: SHX1529

## 2010-04-20 LAB — CONVERTED CEMR LAB
ALT: 32 units/L (ref 0–53)
AST: 29 units/L (ref 0–37)
Albumin: 3.9 g/dL (ref 3.5–5.2)
Alkaline Phosphatase: 54 units/L (ref 39–117)
BUN: 10 mg/dL (ref 6–23)
Basophils Absolute: 0 10*3/uL (ref 0.0–0.1)
Basophils Relative: 0.1 % (ref 0.0–3.0)
Bilirubin Urine: NEGATIVE
Bilirubin, Direct: 0 mg/dL (ref 0.0–0.3)
Blood in Urine, dipstick: NEGATIVE
CO2: 24 meq/L (ref 19–32)
Calcium: 9.5 mg/dL (ref 8.4–10.5)
Chloride: 105 meq/L (ref 96–112)
Cholesterol: 112 mg/dL (ref 0–200)
Creatinine, Ser: 0.7 mg/dL (ref 0.4–1.5)
Eosinophils Absolute: 0.2 10*3/uL (ref 0.0–0.7)
Eosinophils Relative: 2.2 % (ref 0.0–5.0)
GFR calc non Af Amer: 118.4 mL/min (ref >60)
Glucose, Bld: 107 mg/dL — ABNORMAL HIGH (ref 70–99)
Glucose, Urine, Semiquant: NEGATIVE
HCT: 21.7 % — CL (ref 39.0–52.0)
HDL: 39.8 mg/dL (ref >39.00)
Hemoglobin: 7 g/dL — CL (ref 13.0–17.0)
Ketones, urine, test strip: NEGATIVE
LDL Cholesterol: 54 mg/dL (ref 0–99)
Lymphocytes Relative: 11.2 % — ABNORMAL LOW (ref 12.0–46.0)
Lymphs Abs: 1.2 10*3/uL (ref 0.7–4.0)
MCHC: 32.4 g/dL (ref 30.0–36.0)
MCV: 74.5 fL — ABNORMAL LOW (ref 78.0–100.0)
Monocytes Absolute: 1.1 10*3/uL — ABNORMAL HIGH (ref 0.1–1.0)
Monocytes Relative: 10.7 % (ref 3.0–12.0)
Neutro Abs: 7.8 10*3/uL — ABNORMAL HIGH (ref 1.4–7.7)
Neutrophils Relative %: 75.8 % (ref 43.0–77.0)
Nitrite: NEGATIVE
PSA: 0.9 ng/mL (ref 0.10–4.00)
Platelets: 381 10*3/uL (ref 150.0–400.0)
Potassium: 4.3 meq/L (ref 3.5–5.1)
Protein, U semiquant: NEGATIVE
RBC: 2.91 M/uL — ABNORMAL LOW (ref 4.22–5.81)
RDW: 16.6 % — ABNORMAL HIGH (ref 11.5–14.6)
Sodium: 135 meq/L (ref 135–145)
Specific Gravity, Urine: <1.005
Total Bilirubin: 0.8 mg/dL (ref 0.3–1.2)
Total CHOL/HDL Ratio: 3
Total Protein: 7.3 g/dL (ref 6.0–8.3)
Triglycerides: 89 mg/dL (ref 0.0–149.0)
Urobilinogen, UA: NEGATIVE
VLDL: 17.8 mg/dL (ref 0.0–40.0)
WBC Urine, dipstick: NEGATIVE
WBC: 10.3 10*3/uL (ref 4.5–10.5)
pH: 7.5

## 2010-04-22 NOTE — Assessment & Plan Note (Signed)
Summary: 6 month ov//ph   Vital Signs:  Patient profile:   70 year old male Weight:      134.4 pounds Pulse rate:   88 / minute BP sitting:   110 / 62  (left arm) Cuff size:   regular  Vitals Entered By: Shonna Chock (May 31, 2009 8:04 AM) CC: 6 Month follow-up, refill all meds (local pharmacy) Comments REVIEWED MED LIST, PATIENT AGREED DOSE AND INSTRUCTION CORRECT    History of Present Illness:  Hypertension follow-up      This is a 70 year old man who presents for Hypertension follow-up.  The patient denies lightheadedness, urinary frequency, headaches, edema, impotence, rash, and fatigue.  The patient denies the following associated symptoms: chest pain, chest pressure, exercise intolerance, dyspnea, palpitations, syncope, leg edema, and pedal edema.  Compliance with medications (by patient report) has been near 100%.  The patient reports that dietary compliance has been good.  The patient reports exercising occasionally.  Adjunctive measures currently used by the patient include salt restriction.    Hyperlipidemia follow-up      The patient also presents for Hyperlipidemia follow-up.  The patient denies muscle aches, GI upset, abdominal pain, flushing, itching, constipation, diarrhea, and fatigue.  The patient denies the following symptoms: chest pain/pressure, exercise intolerance, dypsnea, palpitations, syncope, and pedal edema.  Compliance with medications (by patient report) has been near 100%.  Dietary compliance has been good.  The patient reports exercising occasionally.  Adjunctive measures currently used by the patient include fiber and ASA.    Pt had colonoscopy in February and is off dexilant now---repeat 1 year. Pt also knows he needs to quit smoking.  Preventive Screening-Counseling & Management  Alcohol-Tobacco     Alcohol drinks/day: 2     Alcohol type: beer     Smoking Status: current     Smoking Cessation Counseling: yes     Smoke Cessation Stage: ready  Packs/Day: 1.0     Year Started: 1957  Caffeine-Diet-Exercise     Caffeine use/day: 2     Does Patient Exercise: no  Current Medications (verified): 1)  Advair Diskus 250-50 Mcg/dose  Misc (Fluticasone-Salmeterol) .... Take One Puff Twice Daily 2)  Lisinopril-Hydrochlorothiazide 20-12.5 Mg  Tabs (Lisinopril-Hydrochlorothiazide) .... Take One Tablet Daily 3)  Amlodipine Besylate 10 Mg  Tabs (Amlodipine Besylate) .... Take One Tablet Daily 4)  Lipitor 40 Mg  Tabs (Atorvastatin Calcium) .... Take One Tablet Daily 5)  Baby Aspirin 81 Mg  Chew (Aspirin) .... One Tablet By Mouth Once Daily 6)  Viagra 100 Mg  Tabs (Sildenafil Citrate) .Marland Kitchen.. 1 By Mouth Once Daily Prn 7)  Centrum Silver  Tabs (Multiple Vitamins-Minerals) .... Once Daily 8)  Iron 325 (65 Fe) Mg Tabs (Ferrous Sulfate) .Marland Kitchen.. 1 By Mouth Three Times A Day 9)  Vitamin C 500 Mg Tabs (Ascorbic Acid) .Marland Kitchen.. 1 By Mouth Two Times A Day  Allergies (verified): No Known Drug Allergies  Past History:  Past medical, surgical, family and social histories (including risk factors) reviewed for relevance to current acute and chronic problems.  Past Medical History: Reviewed history from 01/25/2009 and no changes required. 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 iliac stent  colon surgery  Family History: Reviewed history from 01/25/2009 and no changes required. M--Lung disease Family History Uterine cancer F--colitis No FH of Colon Cancer:  Social History: Reviewed  history from 01/25/2009 and no changes required. Occupation: Nurse, adult Married with 4 children Current Smoker: 2ppd Alcohol use-yes: beer Drug use-no Illicit Drug Use - no  Review of Systems      See HPI  Physical Exam  General:  Well-developed,well-nourished,in no acute distress; alert,appropriate and cooperative throughout examination Lungs:  Normal respiratory effort, chest expands symmetrically. Lungs are clear to auscultation, no crackles or wheezes. Heart:  Grade  2 /6 systolic ejection murmur.   Extremities:  No clubbing, cyanosis, edema, or deformity noted with normal full range of motion of all joints.   Psych:  Oriented X3 and normally interactive.     Impression & Recommendations:  Problem # 1:  ANEMIA-IRON DEFICIENCY (ICD-280.9)  His updated medication list for this problem includes:    Iron 325 (65 Fe) Mg Tabs (Ferrous sulfate) .Marland Kitchen... 1 by mouth three times a day  Orders: Venipuncture ([address]) TLB-Lipid Panel (80061-LIPID) TLB-BMP (Basic Metabolic Panel-BMET) (80048-METABOL) TLB-Hepatic/Liver Function Pnl (80076-HEPATIC) TLB-IBC Pnl (Iron/FE;Transferrin) (83550-IBC) TLB-CBC Platelet - w/Differential (85025-CBCD) TLB-B12 + Folate Pnl (60454_09811-B14/NWG) TLB-Ferritin (82728-FER)  Problem # 2:  TOBACCO ABUSE, HX OF (ICD-V15.82)  Orders: Tobacco use cessation intermediate 3-10 minutes ([address])  Problem # 3:  HYPERTENSION (ICD-401.9)  His updated medication list for this problem includes:    Lisinopril-hydrochlorothiazide 20-12.5 Mg Tabs (Lisinopril-hydrochlorothiazide) .Marland Kitchen... Take one tablet daily    Amlodipine Besylate 10 Mg Tabs (Amlodipine besylate) .Marland Kitchen... Take one tablet daily  Orders: Venipuncture (30865) TLB-Lipid Panel (80061-LIPID) TLB-BMP (Basic Metabolic Panel-BMET) (80048-METABOL) TLB-Hepatic/Liver Function Pnl (80076-HEPATIC) TLB-IBC Pnl (Iron/FE;Transferrin) (83550-IBC) TLB-CBC Platelet - w/Differential (85025-CBCD) TLB-B12 +  Folate Pnl (78469_62952-W41/LKG) TLB-Ferritin (82728-FER) Tobacco use cessation intermediate 3-10 minutes (99406)  BP today: 110/62 Prior BP: 128/74 (01/25/2009)  Labs Reviewed: K+: 4.3 (11/30/2008) Creat: : 0.7 (11/30/2008)   Chol: 112 (11/30/2008)   HDL: 39.80 (11/30/2008)   LDL: 54 (11/30/2008)   TG: 89.0 (11/30/2008)  Problem # 4:  HYPERLIPIDEMIA (ICD-272.4)  His updated medication list for this problem includes:    Lipitor 40 Mg Tabs (Atorvastatin calcium) .Marland Kitchen... Take one tablet daily  Orders: Venipuncture (40102) TLB-Lipid Panel (80061-LIPID) TLB-BMP (Basic Metabolic Panel-BMET) (80048-METABOL) TLB-Hepatic/Liver Function Pnl (80076-HEPATIC) TLB-IBC Pnl (Iron/FE;Transferrin) (83550-IBC) TLB-CBC Platelet - w/Differential (85025-CBCD) TLB-B12 + Folate Pnl (72536_64403-K74/QVZ) TLB-Ferritin (82728-FER) Tobacco use cessation intermediate 3-10 minutes (56387)  Labs Reviewed: SGOT: 29 (11/30/2008)   SGPT: 32 (11/30/2008)   HDL:39.80 (11/30/2008), 49.40 (09/21/2008)  LDL:54 (11/30/2008), 81 (09/21/2008)  Chol:112 (11/30/2008), 149 (09/21/2008)  Trig:89.0 (11/30/2008), 91.0 (09/21/2008)  Complete Medication List: 1)  Advair Diskus 250-50 Mcg/dose Misc (Fluticasone-salmeterol) .... Take one puff twice daily 2)  Lisinopril-hydrochlorothiazide 20-12.5 Mg Tabs (Lisinopril-hydrochlorothiazide) .... Take one tablet daily 3)  Amlodipine Besylate 10 Mg Tabs (Amlodipine besylate) .... Take one tablet daily 4)  Lipitor 40 Mg Tabs (Atorvastatin calcium) .... Take one tablet daily 5)  Baby Aspirin 81 Mg Chew (Aspirin) .... One tablet by mouth once daily 6)  Viagra 100 Mg Tabs (Sildenafil citrate) .Marland Kitchen.. 1 by mouth once daily prn 7)  Centrum Silver Tabs (Multiple vitamins-minerals) .... Once daily 8)  Iron 325 (65 Fe) Mg Tabs (Ferrous sulfate) .Marland Kitchen.. 1 by mouth three times a day 9)  Vitamin C 500 Mg Tabs (Ascorbic acid) .Marland Kitchen.. 1 by mouth two times a day 10)  Chantix Continuing Month Pak 1 Mg Tabs  (Varenicline tartrate) .Marland KitchenMarland KitchenMarland Kitchen  As directed Prescriptions: CHANTIX CONTINUING MONTH PAK 1 MG TABS (VARENICLINE TARTRATE) as directed  #1 x 3   Entered and Authorized by:   Loreen Freud DO   Signed by:   Loreen Freud DO on 05/31/2009   Method used:   Print then Give to Patient   RxID:   [id number] LIPITOR 40 MG  TABS (ATORVASTATIN CALCIUM) TAKE ONE TABLET DAILY  #30 Tablet x 5   Entered and Authorized by:   Loreen Freud DO   Signed by:   Loreen Freud DO on 05/31/2009   Method used:   Electronically to        [id number]* (retail)       [address]       [place], [place]  [address]       Ph: [phone] or [phone]       Fax: [phone]   RxID:   [id number] AMLODIPINE BESYLATE 10 MG  TABS (AMLODIPINE BESYLATE) TAKE ONE TABLET DAILY  #30 Tablet x 5   Entered and Authorized by:   Loreen Freud DO   Signed by:   Loreen Freud DO on 05/31/2009   Method used:   Electronically to        [id number]* (retail)       [address]       [place], [place]  [address]       Ph: [phone] or [phone]       Fax: [phone]   RxID:   [id number] LISINOPRIL-HYDROCHLOROTHIAZIDE 20-12.5 MG  TABS (LISINOPRIL-HYDROCHLOROTHIAZIDE) TAKE ONE TABLET DAILY  #30 Tablet x 5   Entered and Authorized by:   Loreen Freud DO   Signed by:   Loreen Freud DO on 05/31/2009   Method used:   Electronically to        [id number]* (retail)       [address]       [place], [place]  [address]       Ph: [phone] or [phone]       Fax: [phone]   RxID:   [id number] ADVAIR DISKUS 250-50 MCG/DOSE  MISC (FLUTICASONE-SALMETEROL) TAKE ONE PUFF TWICE DAILY  #1 x 11   Entered and Authorized by:   Loreen Freud DO   Signed by:   Loreen Freud DO on 05/31/2009   Method used:   Electronically to        [id number]* (retail)       [address]        [place], [place]  [address]       Ph: [phone] or [phone]       Fax: [phone]   RxID:   [id number]   Last Colonoscopy:  DONE (05/03/2009 12:44:00 PM) Colonoscopy Result Date:  05/03/2009 Colonoscopy Result:  polyp- repeat 1 year  Appended Document: Orders Update     Clinical Lists Changes  Orders: Added new Service order of Prescription Created Electronically [phone]) - Signed

## 2010-04-22 NOTE — Progress Notes (Signed)
Summary: sample request   Phone Note Refill Request Message from:  Patient  Refills Requested: Medication #1:  VIAGRA 100 MG  TABS 1 by mouth once daily prn Pt wife called stating that insurance wanted $60 for 4 pills. Pt wife would like to know if pt can have some samples of meds or is there a way that he can receive a free supply. Last OV 01-17-10. Pls advise...........Marland KitchenFelecia Deloach CMA  February 27, 2010 3:31 PM    Follow-up for Phone Call        I don't believe there is pt assistance for viagra.  He can have  1 sample pack if we have any. Follow-up by: Loreen Freud DO,  February 27, 2010 4:44 PM  Additional Follow-up for Phone Call Additional follow up Details #1::        tried calling pt.Marland KitchenMarland KitchenNo ans, will try later.... Almeta Monas CMA Duncan Dull)  February 27, 2010 4:56 PM     Additional Follow-up for Phone Call Additional follow up Details #2::    samples left at check in. Mssg left on vm for pt to pick up..... Almeta Monas CMA Duncan Dull)  February 28, 2010 8:20 AM

## 2010-04-22 NOTE — Procedures (Signed)
Summary: Colonoscopy  Patient: [name] Note: All result statuses are Final unless otherwise noted.  Tests: (1) Colonoscopy (COL)   COL Colonoscopy           DONE     [facility]     [address]     [place], [place]  [address]           COLONOSCOPY PROCEDURE REPORT           PATIENT:  Ronald Bell, Ronald Bell  MR#:  [id number]     BIRTHDATE:  Mar 05, 1941, 68 yrs. old  GENDER:  male           ENDOSCOPIST:  Wilhemina Bonito. Eda Keys, MD     Referred by:  Office           PROCEDURE DATE:  05/03/2009     PROCEDURES:  1.Colonoscopy with snare polypectomy,     2. Colonoscopy with submucosal     injection           ASA CLASS:  Class II     INDICATIONS:  excision of known colonic polyps (bx proven adenoma     just distal to ICV 10-10)           MEDICATIONS:   Fentanyl 75 mcg IV, Versed 7 mg IV, Benadryl 25 mg     IV           DESCRIPTION OF PROCEDURE:   After the risks benefits and     alternatives of the procedure were thoroughly explained, informed     consent was obtained.  Digital rectal exam was performed and     revealed no abnormalities.   The LB [id number] endoscope     was introduced through the anus and advanced to the cecum, which     was identified by both the appendix and ileocecal valve, without     limitations.Time to cecum = 2:45 min.  The quality of the prep was     good, using MoviPrep.  The instrument was then slowly withdrawn     (time = 25:12 min) as the colon was fully examined.     <<PROCEDUREIMAGES>>           FINDINGS:  A 12mm sessile polyp (previously bx  and showing     adenoma) was found in the ascending colon just distal to the ICV.     The polyp was raised with normal sailine submucosal injection then     subsequently snared, then cauterized with monopolar cautery. The     polyp was difficult to remove (despite raising and stiff     minisnare) as it was linear and raod along the apex of a large     fold. The polyp was piecemealed the  significantly     cauterized.Retrieval of small fragments was unsuccessful.  This     was otherwise a normal examination of the colon.   Retroflexed     views in the rectum revealed no abnormalities.    The scope was     then withdrawn from the patient and the procedure completed.           COMPLICATIONS:  None           ENDOSCOPIC IMPRESSION:     1) Sessile polyp in the proximal ascending colon removed (see     above)     2) Otherwise normal examination           RECOMMENDATIONS:  1) Follow up colonoscopy in 1 year           ______________________________     Wilhemina Bonito. Eda Keys, MD           CC:  Lelon Perla, DO; The Patient           n.     eSIGNED:   Isebella Upshur N. Eda Keys at 05/03/2009 01:01 PM           Janine Ores, [id number]  Note: An exclamation mark (!) indicates a result that was not dispersed into the flowsheet. Document Creation Date: 05/03/2009 1:01 PM _______________________________________________________________________  (1) Order result status: Final Collection or observation date-time: 05/03/2009 12:44 Requested date-time:  Receipt date-time:  Reported date-time:  Referring Physician:   Ordering Physician: Fransico Setters [phone]) Specimen Source:  Source: Launa Grill Order Number: [id number] Lab site:

## 2010-04-22 NOTE — Letter (Signed)
Summary: Previsit letter  [facility]  [address] [place], [place] [address]   Phone: [phone]  Fax: [phone]       04/05/2009 MRN: [id number]  Ronald Bell [address] [place], [place]  [address]  Dear [name],  Welcome to the Gastroenterology Division at [facility].    You are scheduled to see a nurse for your pre-procedure visit on 04-26-09 at 8:30a.m. on the 3rd floor at [facility], [address]  We ask that you try to arrive at our office 15 minutes prior to your appointment time to allow for check-in.  Your nurse visit will consist of discussing your medical and surgical history, your immediate family medical history, and your medications.    Please bring a complete list of all your medications or, if you prefer, bring the medication bottles and we will list them.  We will need to be aware of both prescribed and over the counter drugs.  We will need to know exact dosage information as well.  If you are on blood thinners (Coumadin, Plavix, Aggrenox, Ticlid, etc.) please call our office today/prior to your appointment, as we need to consult with your physician about holding your medication.   Please be prepared to read and sign documents such as consent forms, a financial agreement, and acknowledgement forms.  If necessary, and with your consent, a friend or relative is welcome to sit-in on the nurse visit with you.  Please bring your insurance card so that we may make a copy of it.  If your insurance requires a referral to see a specialist, please bring your referral form from your primary care physician.  No co-pay is required for this nurse visit.     If you cannot keep your appointment, please call [phone] to cancel or reschedule prior to your appointment date.  This allows [place] the opportunity to schedule an appointment for another patient in need of care.    Thank you for choosing [facility] for your medical  needs.  We appreciate the opportunity to care for you.  Please visit [place] at our website  to learn more about our practice.                     Sincerely.                                                                                                                   The Gastroenterology Division

## 2010-04-22 NOTE — Miscellaneous (Signed)
Summary: SCREENING COLON--CH.  Clinical Lists Changes  Medications: Added new medication of MOVIPREP 100 GM  SOLR (PEG-KCL-NACL-NASULF-NA ASC-C) As directed. - Signed Rx of MOVIPREP 100 GM  SOLR (PEG-KCL-NACL-NASULF-NA ASC-C) As directed.;  #1 x 1;  Signed;  Entered by: Clide Cliff RN;  Authorized by: Hilarie Fredrickson MD;  Method used: Electronically to [id number]*, [address] [place], [place], [place]  [address], Ph: [phone] or [phone], Fax: [phone] Observations: Added new observation of ALLERGY REV: Done (04/26/2009 8:13)    Prescriptions: MOVIPREP 100 GM  SOLR (PEG-KCL-NACL-NASULF-NA ASC-C) As directed.  #1 x 1   Entered by:   Clide Cliff RN   Authorized by:   Hilarie Fredrickson MD   Signed by:   Clide Cliff RN on 04/26/2009   Method used:   Electronically to        [id number]* (retail)       [address]       [place], [place]  [address]       Ph: [phone] or [phone]       Fax: [phone]   RxID:   [id number]

## 2010-04-22 NOTE — Letter (Signed)
Summary: Previsit letter  [facility]  [address] [place], [place] [address]   Phone: [phone]  Fax: [phone]       01/17/2010 MRN: [id number]  Ronald Bell [address] [place], [place]  [address]  Dear Ronald Bell,  Welcome to the Gastroenterology Division at [facility].    You are scheduled to see a nurse for your pre-procedure visit on 03/14/10 at 8 am on the 3rd floor at [facility], [address].  We ask that you try to arrive at our office 15 minutes prior to your appointment time to allow for check-in.  Your nurse visit will consist of discussing your medical and surgical history, your immediate family medical history, and your medications.    Please bring a complete list of all your medications or, if you prefer, bring the medication bottles and we will list them.  We will need to be aware of both prescribed and over the counter drugs.  We will need to know exact dosage information as well.  If you are on blood thinners (Coumadin, Plavix, Aggrenox, Ticlid, etc.) please call our office today/prior to your appointment, as we need to consult with your physician about holding your medication.   Please be prepared to read and sign documents such as consent forms, a financial agreement, and acknowledgement forms.  If necessary, and with your consent, a friend or relative is welcome to sit-in on the nurse visit with you.  Please bring your insurance card so that we may make a copy of it.  If your insurance requires a referral to see a specialist, please bring your referral form from your primary care physician.  No co-pay is required for this nurse visit.     If you cannot keep your appointment, please call [phone] to cancel or reschedule prior to your appointment date.  This allows [place] the opportunity to schedule an appointment for another patient in need of care.    Thank you for choosing [facility] for your medical  needs.  We appreciate the opportunity to care for you.  Please visit [place] at our website  to learn more about our practice.                     Sincerely.                                                                                                                   The Gastroenterology Division

## 2010-04-22 NOTE — Assessment & Plan Note (Signed)
Summary: 6 month roa//lch   Vital Signs:  Patient profile:   70 year old male Height:      63.5 inches Weight:      137.6 pounds BMI:     24.08 Temp:     98.1 degrees F oral Pulse rate:   84 / minute Pulse rhythm:   regular BP sitting:   126 / 72  (left arm)  Vitals Entered By: Almeta Monas CMA Duncan Dull) (November 29, 2009 9:20 AM) CC: 6 MO F/U Pt has not taken Chantix yet   History of Present Illness:  Hyperlipidemia follow-up      This is a 70 year old man who presents for Hyperlipidemia follow-up.  The patient denies muscle aches, GI upset, abdominal pain, flushing, itching, constipation, diarrhea, and fatigue.  The patient denies the following symptoms: chest pain/pressure, exercise intolerance, dypsnea, palpitations, syncope, and pedal edema.  Compliance with medications (by patient report) has been near 100%.  Dietary compliance has been good.  Adjunctive measures currently used by the patient include ASA.    Hypertension follow-up      The patient also presents for Hypertension follow-up.  The patient denies lightheadedness, urinary frequency, headaches, edema, impotence, rash, and fatigue.  The patient denies the following associated symptoms: chest pain, chest pressure, exercise intolerance, dyspnea, palpitations, syncope, leg edema, and pedal edema.  Compliance with medications (by patient report) has been near 100%.  The patient reports that dietary compliance has been good.  Adjunctive measures currently used by the patient include salt restriction.    Preventive Screening-Counseling & Management  Alcohol-Tobacco     Alcohol drinks/day: 2     Alcohol type: beer     Smoking Status: current     Smoking Cessation Counseling: yes     Smoke Cessation Stage: ready     Packs/Day: 1.5     Year Started: 1957  Caffeine-Diet-Exercise     Caffeine use/day: 2     Does Patient Exercise: no  Current Medications (verified): 1)  Advair Diskus 250-50 Mcg/dose  Misc  (Fluticasone-Salmeterol) .... Take One Puff Twice Daily 2)  Lisinopril-Hydrochlorothiazide 20-12.5 Mg  Tabs (Lisinopril-Hydrochlorothiazide) .... Take One Tablet Daily 3)  Amlodipine Besylate 10 Mg  Tabs (Amlodipine Besylate) .... Take One Tablet Daily 4)  Lipitor 40 Mg  Tabs (Atorvastatin Calcium) .... Take One Tablet Daily 5)  Baby Aspirin 81 Mg  Chew (Aspirin) .... One Tablet By Mouth Once Daily 6)  Viagra 100 Mg  Tabs (Sildenafil Citrate) .Marland Kitchen.. 1 By Mouth Once Daily Prn 7)  Centrum Silver  Tabs (Multiple Vitamins-Minerals) .... Once Daily 8)  Iron 325 (65 Fe) Mg Tabs (Ferrous Sulfate) .Marland Kitchen.. 1 By Mouth Two Times A Day 9)  Vitamin C 250 Mg Tabs (Ascorbic Acid) .Marland Kitchen.. 1 By Mouth Two Times A Day 10)  Chantix Continuing Month Pak 1 Mg Tabs (Varenicline Tartrate) .... As Directed  Allergies (verified): No Known Drug Allergies  Past History:  Past medical, surgical, family and social histories (including risk factors) reviewed for relevance to current acute and chronic problems.  Past Medical History: Reviewed history from 01/25/2009 and no changes required. 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 iliac stent colon  surgery  Family History: Reviewed history from 01/25/2009 and no changes required. M--Lung disease Family History Uterine cancer F--colitis No FH of Colon Cancer:  Social History: Reviewed history from 01/25/2009 and no changes required. Occupation: Nurse, adult Married with 4 children Current  Smoker: 2ppd Alcohol use-yes: beer Drug use-no Illicit Drug Use - no Packs/Day:  1.5  Review of Systems      See HPI  Physical Exam  General:  Well-developed,well-nourished,in no acute distress; alert,appropriate and cooperative throughout examination Lungs:  Normal respiratory effort, chest expands symmetrically. Lungs are clear to auscultation, no crackles or wheezes. Heart:  normal rate and no murmur.   Extremities:  No clubbing, cyanosis, edema, or deformity noted with normal full range of motion of all joints.   Psych:  Oriented X3 and normally interactive.     Impression & Recommendations:  Problem # 1:  COPD (ICD-496)  His updated medication list for this problem includes:    Advair Diskus 250-50 Mcg/dose Misc (Fluticasone-salmeterol) .Marland Kitchen... Take one puff twice daily  Vaccines Reviewed: Pneumovax: Pneumovax (11/30/2008)   Flu Vax: Fluvax Non-MCR (12/14/2008)  Problem # 2:  HYPERTENSION (ICD-401.9)  His updated medication list for this problem includes:    Lisinopril-hydrochlorothiazide 20-12.5 Mg Tabs (Lisinopril-hydrochlorothiazide) .Marland Kitchen... Take one tablet daily    Amlodipine Besylate 10 Mg Tabs (Amlodipine besylate) .Marland Kitchen... Take one tablet daily  Orders: Venipuncture (11914) TLB-Lipid Panel (80061-LIPID) TLB-BMP (Basic Metabolic Panel-BMET) (80048-METABOL) TLB-CBC Platelet - w/Differential (85025-CBCD) TLB-B12 + Folate Pnl (78295_62130-Q65/HQI) TLB-Hepatic/Liver Function Pnl (80076-HEPATIC) TLB-IBC Pnl (Iron/FE;Transferrin) (83550-IBC) TLB-Ferritin (82728-FER) Specimen Handling (69629)  BP today: 126/72 Prior BP: 110/62 (05/31/2009)  Labs Reviewed: K+: 4.4 (05/31/2009) Creat: : 0.6 (05/31/2009)   Chol: 128 (05/31/2009)   HDL: 56.30 (05/31/2009)   LDL: 47 (05/31/2009)   TG: 125.0 (05/31/2009)  Problem # 3:  HYPERLIPIDEMIA (ICD-272.4)  His updated medication list for this problem includes:    Lipitor 40 Mg Tabs (Atorvastatin calcium) .Marland Kitchen... Take one tablet  daily  Orders: Venipuncture (52841) TLB-Lipid Panel (80061-LIPID) TLB-BMP (Basic Metabolic Panel-BMET) (80048-METABOL) TLB-CBC Platelet - w/Differential (85025-CBCD) TLB-B12 + Folate Pnl (32440_10272-Z36/UYQ) TLB-Hepatic/Liver Function Pnl (80076-HEPATIC) TLB-IBC Pnl (Iron/FE;Transferrin) (83550-IBC) TLB-Ferritin (82728-FER) Specimen Handling (03474)  Labs Reviewed: SGOT: 32 (05/31/2009)   SGPT: 51 (05/31/2009)   HDL:56.30 (05/31/2009), 39.80 (11/30/2008)  LDL:47 (05/31/2009), 54 (11/30/2008)  Chol:128 (05/31/2009), 112 (11/30/2008)  Trig:125.0 (05/31/2009), 89.0 (11/30/2008)  Problem # 4:  ANEMIA-IRON DEFICIENCY (ICD-280.9)  His updated medication list for this problem includes:    Iron 325 (65 Fe) Mg Tabs (Ferrous sulfate) .Marland Kitchen... 1 by mouth two times a day  Orders: Venipuncture (25956) TLB-Lipid Panel (80061-LIPID) TLB-BMP (Basic Metabolic Panel-BMET) (80048-METABOL) TLB-CBC Platelet - w/Differential (85025-CBCD) TLB-B12 + Folate Pnl (38756_43329-J18/ACZ) TLB-Hepatic/Liver Function Pnl (80076-HEPATIC) TLB-IBC Pnl (Iron/FE;Transferrin) (83550-IBC) TLB-Ferritin (82728-FER)  Hgb: 15.2 (05/31/2009)   Hct: 44.7 (05/31/2009)   Platelets: 246.0 (05/31/2009) RBC: 4.52 (05/31/2009)   RDW: 12.5 (05/31/2009)   WBC: 9.4 (05/31/2009) MCV: 99.1 (05/31/2009)   MCHC: 33.9 (05/31/2009) Ferritin: 61.6 (05/31/2009) Iron: 122 (05/31/2009)   % Sat: 27.0 (05/31/2009) B12: 363 (05/31/2009)   Folate: >20.0 ng/mL (05/31/2009)     Problem # 5:  TOBACCO ABUSE, HX OF (ICD-V15.82) pt given samples of nicorette mini lozenges and patch to consider pt s Orders: Venipuncture (66063) TLB-Lipid Panel (80061-LIPID) TLB-BMP (Basic Metabolic Panel-BMET) (80048-METABOL) TLB-CBC Platelet - w/Differential (85025-CBCD) TLB-B12 + Folate Pnl (01601_09323-F57/DUK) TLB-Hepatic/Liver Function Pnl (80076-HEPATIC) TLB-IBC Pnl (Iron/FE;Transferrin) (83550-IBC) TLB-Ferritin (82728-FER) Tobacco use cessation  intermediate 3-10 minutes (02542)  Complete Medication List: 1)  Advair  Diskus 250-50 Mcg/dose Misc (Fluticasone-salmeterol) .... Take one puff twice daily 2)  Lisinopril-hydrochlorothiazide 20-12.5 Mg Tabs (Lisinopril-hydrochlorothiazide) .... Take one tablet daily 3)  Amlodipine Besylate 10 Mg Tabs (Amlodipine besylate) .... Take one tablet daily 4)  Lipitor 40 Mg Tabs (Atorvastatin calcium) .... Take one tablet daily 5)  Baby Aspirin 81 Mg Chew (Aspirin) .... One tablet by mouth once daily 6)  Viagra 100 Mg Tabs (Sildenafil citrate) .Marland Kitchen.. 1 by mouth once daily prn 7)  Centrum Silver Tabs (Multiple vitamins-minerals) .... Once daily 8)  Iron 325 (65 Fe) Mg Tabs (Ferrous sulfate) .Marland Kitchen.. 1 by mouth two times a day 9)  Vitamin C 250 Mg Tabs (ascorbic Acid)  .Marland Kitchen.. 1 by mouth two times a day 10)  Chantix Continuing Month Pak 1 Mg Tabs (Varenicline tartrate) .... As directed  Other Orders: TLB-PSA (Prostate Specific Antigen) (84153-PSA)  Patient Instructions: 1)  return for physical 2)  Tobacco is very bad for your health and your loved ones ! You should stop smoking !  3)  Stop smoking tips: Choose a quit date. Cut down before the quit date. Decide what you will do as a substitute when you feel the urge to smoke(gum, toothpick, exercise).  Prescriptions: VIAGRA 100 MG  TABS (SILDENAFIL CITRATE) 1 by mouth once daily prn  #10 x 3   Entered and Authorized by:   Loreen Freud DO   Signed by:   Loreen Freud DO on 11/29/2009   Method used:   Electronically to        [id number] (retail)       [address]       [place], [place]  [address]       Ph: [phone] or [phone]       Fax: [phone]   RxID:   [id number] LIPITOR 40 MG  TABS (ATORVASTATIN CALCIUM) TAKE ONE TABLET DAILY  #30 Tablet x 5   Entered and Authorized by:   Loreen Freud DO   Signed by:   Loreen Freud DO on 11/29/2009   Method used:   Electronically to        [facility] [id number]* (retail)       [address]       [place], [place]  [address]       Ph: [phone] or [phone]       Fax: [phone]   RxID:   [id number] AMLODIPINE BESYLATE 10 MG  TABS (AMLODIPINE BESYLATE) TAKE ONE TABLET DAILY  #30 Tablet x 5   Entered and Authorized by:   Loreen Freud DO   Signed by:   Loreen Freud DO on 11/29/2009   Method used:   Electronically to        [facility]  [id number] (retail)       [address]       [place], [place]  [address]       Ph: [phone] or [phone]       Fax: [phone]   RxID:   [id number] LISINOPRIL-HYDROCHLOROTHIAZIDE 20-12.5 MG  TABS (LISINOPRIL-HYDROCHLOROTHIAZIDE) TAKE ONE TABLET DAILY  #30 Tablet x 5   Entered and Authorized by:   Loreen Freud DO   Signed by:   Loreen Freud DO on 11/29/2009   Method used:   Electronically to  [facility]  [address]* (retail)       [address]       [place], [place]  [address]       Ph: [phone] or [phone]       Fax: [phone]   RxID:   [id number] ADVAIR DISKUS 250-50 MCG/DOSE  MISC (FLUTICASONE-SALMETEROL) TAKE ONE PUFF TWICE DAILY  #1 x 5   Entered and Authorized by:   Loreen Freud DO   Signed by:   Loreen Freud DO on 11/29/2009   Method used:   Electronically to        [facility]* (retail)       [address]       [place], [place]  [address]       Ph: [phone] or [phone]       Fax: [phone]   RxID:   [id number]   Appended Document: 6 month roa//lch  Laboratory Results   Urine Tests   Date/Time Reported: November 29, 2009 10:21 AM   Routine Urinalysis   Color: yellow Appearance: Clear Glucose: negative   (Normal Range: Negative) Bilirubin: negative   (Normal Range: Negative) Ketone: negative   (Normal Range: Negative) Spec. Gravity: <1.005   (Normal Range: 1.003-1.035) Blood: negative    (Normal Range: Negative) pH: 7.0   (Normal Range: 5.0-8.0) Protein: negative   (Normal Range: Negative) Urobilinogen: negative   (Normal Range: 0-1) Nitrite: negative   (Normal Range: Negative) Leukocyte Esterace: negative   (Normal Range: Negative)    Comments: Floydene Flock  November 29, 2009 10:21 AM

## 2010-04-22 NOTE — Miscellaneous (Signed)
Summary: Orders Update  Clinical Lists Changes  Orders: Added new Test order of Carotid Duplex (Carotid Duplex) - Signed 

## 2010-04-22 NOTE — Letter (Signed)
Summary: Moviprep Instructions  [facility]  [address]   [place], [place] [address]   Phone: [phone]  Fax: [phone]       Ronald Bell    01-29-1941    MRN: [id number]        Procedure Day /Date: 05-03-09 Friday     Arrival Time: 10:30 a.m.      Procedure Time: 11:30 a.m.     Location of Procedure:                     x  [facility] (4th Floor)                        PREPARATION FOR COLONOSCOPY WITH MOVIPREP   Starting 5 days prior to your procedure 04-28-09 do not eat nuts, seeds, popcorn, corn, beans, peas,  salads, or any raw vegetables.  Do not take any fiber supplements (e.g. Metamucil, Citrucel, and Benefiber).  THE DAY BEFORE YOUR PROCEDURE         DATE:  05-02-09   DAY: Thursday  1.  Drink clear liquids the entire day-NO SOLID FOOD  2.  Do not drink anything colored red or purple.  Avoid juices with pulp.  No orange juice.  3.  Drink at least 64 oz. (8 glasses) of fluid/clear liquids during the day to prevent dehydration and help the prep work efficiently.  CLEAR LIQUIDS INCLUDE: Water Jello Ice Popsicles Tea (sugar ok, no milk/cream) Powdered fruit flavored drinks Coffee (sugar ok, no milk/cream) Gatorade Juice: apple, white grape, white cranberry  Lemonade Clear bullion, consomm, broth Carbonated beverages (any kind) Strained chicken noodle soup Hard Candy                             4.  In the morning, mix first dose of MoviPrep solution:    Empty 1 Pouch A and 1 Pouch B into the disposable container    Add lukewarm drinking water to the top line of the container. Mix to dissolve    Refrigerate (mixed solution should be used within 24 hrs)  5.  Begin drinking the prep at 5:00 p.m. The MoviPrep container is divided by 4 marks.   Every 15 minutes drink the solution down to the next mark (approximately 8 oz) until the full liter is complete.   6.  Follow completed prep with 16 oz of clear liquid of your  choice (Nothing red or purple).  Continue to drink clear liquids until bedtime.  7.  Before going to bed, mix second dose of MoviPrep solution:    Empty 1 Pouch A and 1 Pouch B into the disposable container    Add lukewarm drinking water to the top line of the container. Mix to dissolve    Refrigerate  THE DAY OF YOUR PROCEDURE      DATE:  05-03-09  DAY:  Friday  : Beginning at 6:30 a.m. (5 hours before procedure):         1. Every 15 minutes, drink the solution down to the next mark (approx 8 oz) until the full liter is complete.  2. Follow completed prep with 16 oz. of clear liquid of your choice.    3. You may drink clear liquids until  9:30 a.m.  (2 HOURS BEFORE PROCEDURE).   MEDICATION INSTRUCTIONS  Unless otherwise instructed, you should take regular prescription medications with a small sip of water  as early as possible the morning of your procedure.         OTHER INSTRUCTIONS  You will need a responsible adult at least 72 years of age to accompany you and drive you home.   This person must remain in the waiting room during your procedure.  Wear loose fitting clothing that is easily removed.  Leave jewelry and other valuables at home.  However, you may wish to bring a book to read or  an iPod/MP3 player to listen to music as you wait for your procedure to start.  Remove all body piercing jewelry and leave at home.  Total time from sign-in until discharge is approximately 2-3 hours.  You should go home directly after your procedure and rest.  You can resume normal activities the  day after your procedure.  The day of your procedure you should not:   Drive   Make legal decisions   Operate machinery   Drink alcohol   Return to work  You will receive specific instructions about eating, activities and medications before you leave.    The above instructions have been reviewed and explained to me by   Clide Cliff, RN_____________________    I  fully understand and can verbalize these instructions _____________________________ Date _________

## 2010-04-22 NOTE — Progress Notes (Signed)
Summary: Schedule colon  ---- Converted from flag ---- ---- 01/17/2010 10:28 AM, Hilarie Fredrickson MD wrote: Alexia Freestone, patinet was to have f/u colonoscopy 04-2010. However, Dr. Laury Axon found heme + stool. Please set him up for colonoscopy in [facility] at his nearest convenience. Movi prep.;  stop iron one week prior. thanks ------------------------------  Appended Document: Schedule colon left message on machine to call back   Appended Document: Schedule colon pt aware and colon and previsit scheduled   Clinical Lists Changes  Problems: Added new problem of HEMOCCULT POSITIVE STOOL (ICD-578.1)
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
## 2010-04-22 NOTE — Progress Notes (Signed)
Summary: Carotid Results   Phone Note Outgoing Call   Call placed by: Army Fossa CMA,  Jul 23, 2009 8:44 AM Summary of Call: Spoke with male and pts home, Left message for him to call us back:  stable right carotid disease moderate L CAD-----repeat 6 months Signed by Loreen Freud DO on 07/22/2009 at 5:18 PM  Follow-up for Phone Call        Pt is aware. Army Fossa CMA  Jul 23, 2009 4:00 PM

## 2010-04-24 NOTE — Miscellaneous (Signed)
Summary: [facility] PV  Clinical Lists Changes  Observations: Added new observation of NKA: T (03/14/2010 7:48)

## 2010-04-24 NOTE — Miscellaneous (Signed)
Summary: Moviprep RX  Clinical Lists Changes  Medications: Added new medication of MOVIPREP 100 GM  SOLR (PEG-KCL-NACL-NASULF-NA ASC-C) As per prep instructions. - Signed Rx of MOVIPREP 100 GM  SOLR (PEG-KCL-NACL-NASULF-NA ASC-C) As per prep instructions.;  #1 x 0;  Signed;  Entered by: Durwin Glaze RN;  Authorized by: Hilarie Fredrickson MD;  Method used: Electronically to [id number] [address], [place], [place]  [address], Ph: [phone], Fax: [phone]    Prescriptions: MOVIPREP 100 GM  SOLR (PEG-KCL-NACL-NASULF-NA ASC-C) As per prep instructions.  #1 x 0   Entered by:   Durwin Glaze RN   Authorized by:   Hilarie Fredrickson MD   Signed by:   Durwin Glaze RN on 03/14/2010   Method used:   Electronically to        [facility] [id number] (retail)       [address] [place], [place]  [address]       Ph: [phone]       Fax: [phone]   RxID:   [id number]

## 2010-04-24 NOTE — Progress Notes (Signed)
Summary: Rescheduled COL to ECL   ---- Converted from flag ---- ---- 03/14/2010 12:56 PM, Hilarie Fredrickson MD wrote: yes. Thank you!  ---- 03/14/2010 11:29 AM, Durwin Glaze RN wrote: Dr. Marina Goodell,  While you were out of the office, I did a previsit for Mr. Ronald Bell for a 1 year COLONOSCOPY  recall (polyps). The patient mentioned that Dr. Virginia Rochester recently sent him a letter recommending an EGD recall due to Barrett's. He requested to reschedule his colonoscopy for an ECL. I gathered and printed Dr. Wende Neighbors procedure and pathology reports to verify this and rescheduled him. I also copied the letter from Dr. Wende Neighbors office. I told the patient that I would notify you of this information and contact him with any changes and he thanked [place]. Is this okay? Thanks, Dr. Marina Goodell! ------------------------------

## 2010-04-24 NOTE — Letter (Signed)
Summary: [facility] Instructions  [facility]  [address] [place], [place] [address]   Phone: [phone]  Fax: [phone]       Ronald Bell    Oct 09, 1940    MRN: [id number]        Procedure Day Dorna Bloom:  Farrell Ours 05/02/10     Arrival Time:  7:30am     Procedure Time:  8:00am     Location of Procedure:                    [facility] Pares _  [facility] (4th Floor)                       PREPARATION FOR COLONOSCOPY WITH MOVIPREP   Starting 5 days prior to your procedure  SUNDAY 02/05  do not eat nuts, seeds, popcorn, corn, beans, peas,  salads, or any raw vegetables.  Do not take any fiber supplements (e.g. Metamucil, Citrucel, and Benefiber).  THE DAY BEFORE YOUR PROCEDURE         DATE: THURSDAY  12/29  1.  Drink clear liquids the entire day-NO SOLID FOOD  2.  Do not drink anything colored red or purple.  Avoid juices with pulp.  No orange juice.  3.  Drink at least 64 oz. (8 glasses) of fluid/clear liquids during the day to prevent dehydration and help the prep work efficiently.  CLEAR LIQUIDS INCLUDE: Water Jello Ice Popsicles Tea (sugar ok, no milk/cream) Powdered fruit flavored drinks Coffee (sugar ok, no milk/cream) Gatorade Juice: apple, white grape, white cranberry  Lemonade Clear bullion, consomm, broth Carbonated beverages (any kind) Strained chicken noodle soup Hard Candy                             4.  In the morning, mix first dose of MoviPrep solution:    Empty 1 Pouch A and 1 Pouch B into the disposable container    Add lukewarm drinking water to the top line of the container. Mix to dissolve    Refrigerate (mixed solution should be used within 24 hrs)  5.  Begin drinking the prep at 5:00 p.m. The MoviPrep container is divided by 4 marks.   Every 15 minutes drink the solution down to the next mark (approximately 8 oz) until the full liter is complete.   6.  Follow completed prep with 16 oz of clear liquid of your choice  (Nothing red or purple).  Continue to drink clear liquids until bedtime.  7.  Before going to bed, mix second dose of MoviPrep solution:    Empty 1 Pouch A and 1 Pouch B into the disposable container    Add lukewarm drinking water to the top line of the container. Mix to dissolve    Refrigerate  THE DAY OF YOUR PROCEDURE      DATE:  FRIDAY  12/30  Beginning at  3:00 a.m. (5 hours before procedure):         1. Every 15 minutes, drink the solution down to the next mark (approx 8 oz) until the full liter is complete.  2. Follow completed prep with 16 oz. of clear liquid of your choice.    3. You may drink clear liquids until 7:30am  (2 HOURS BEFORE PROCEDURE).   MEDICATION INSTRUCTIONS  Unless otherwise instructed, you should take regular prescription medications with a small sip of water   as early as  possible the morning of your procedure.   Additional medication instructions:  DO NOT TAKE LISINOPRIL/HCTZ ON MORNING OF PROCEDURE.         OTHER INSTRUCTIONS  You will need a responsible adult at least 72 years of age to accompany you and drive you home.   This person must remain in the waiting room during your procedure.  Wear loose fitting clothing that is easily removed.  Leave jewelry and other valuables at home.  However, you may wish to bring a book to read or  an iPod/MP3 player to listen to music as you wait for your procedure to start.  Remove all body piercing jewelry and leave at home.  Total time from sign-in until discharge is approximately 2-3 hours.  You should go home directly after your procedure and rest.  You can resume normal activities the  day after your procedure.  The day of your procedure you should not:   Drive   Make legal decisions   Operate machinery   Drink alcohol   Return to work  You will receive specific instructions about eating, activities and medications before you leave.    The above instructions have been reviewed  and explained to me by   Durwin Glaze RN  March 14, 2010 8:28 AM    I fully understand and can verbalize these instructions _____________________________ Date _________

## 2010-05-02 ENCOUNTER — Other Ambulatory Visit: Payer: Self-pay | Admitting: Internal Medicine

## 2010-05-02 ENCOUNTER — Encounter: Payer: Self-pay | Admitting: Internal Medicine

## 2010-05-02 ENCOUNTER — Encounter (AMBULATORY_SURGERY_CENTER): Payer: BC Managed Care – PPO | Admitting: Internal Medicine

## 2010-05-02 DIAGNOSIS — Z1211 Encounter for screening for malignant neoplasm of colon: Secondary | ICD-10-CM

## 2010-05-02 DIAGNOSIS — Z86010 Personal history of colonic polyps: Secondary | ICD-10-CM

## 2010-05-02 DIAGNOSIS — K5520 Angiodysplasia of colon without hemorrhage: Secondary | ICD-10-CM

## 2010-05-02 DIAGNOSIS — D126 Benign neoplasm of colon, unspecified: Secondary | ICD-10-CM

## 2010-05-02 DIAGNOSIS — K219 Gastro-esophageal reflux disease without esophagitis: Secondary | ICD-10-CM

## 2010-05-02 DIAGNOSIS — K2980 Duodenitis without bleeding: Secondary | ICD-10-CM

## 2010-05-05 LAB — HELICOBACTER PYLORI SCREEN-BIOPSY: UREASE: NEGATIVE

## 2010-05-06 ENCOUNTER — Encounter: Payer: Self-pay | Admitting: Internal Medicine

## 2010-05-08 NOTE — Procedures (Addendum)
Summary: Upper Endoscopy  Patient: Ronald Bell Note: All result statuses are Final unless otherwise noted.  Tests: (1) Upper Endoscopy (EGD)   EGD Upper Endoscopy       DONE     [facility]     [address]     [place], [place]  [address]           ENDOSCOPY PROCEDURE REPORT           PATIENT:  Ronald Bell, Ronald Bell  MR#:  [id number]     BIRTHDATE:  13-Apr-1940, 69 yrs. old  GENDER:  male           ENDOSCOPIST:  Wilhemina Bonito. Eda Keys, MD     Referred by:  Surveillance Program Recall,           PROCEDURE DATE:  05/02/2010     PROCEDURE:  EGD with biopsy, [address]     ASA CLASS:  Class II     INDICATIONS:  h/o Barrett's Esophagus ; Dr Virginia Rochester recall           MEDICATIONS:   There was residual sedation effect present from     prior procedure., Versed 1 mg IV     TOPICAL ANESTHETIC:  Exactacain Spray           DESCRIPTION OF PROCEDURE:   After the risks benefits and     alternatives of the procedure were thoroughly explained, informed     consent was obtained.  The [facility] [id number] endoscope was     introduced through the mouth and advanced to the second portion of     the duodenum, without limitations.  The instrument was slowly     withdrawn as the mucosa was fully examined.     <<PROCEDUREIMAGES>>           The esophagus and gastroesophageal junction were completely normal     in appearance. NO ENDOSCOPIC EVIDENCE OF BARRETT'S. The stomach     was entered and closely examined. The antrum, angularis, and     lesser curvature were well visualized, including a retroflexed     view of the cardia and fundus. The stomach wall was normally     distensable. The scope passed easily through the pylorus into the     duodenum.  Duodenitis was found in the duodenal bulb . CLO BX     taken    Retroflexed views revealed no abnormalities.    The scope     was then withdrawn from the patient and the procedure completed.           COMPLICATIONS:  None           ENDOSCOPIC  IMPRESSION:     1) Normal esophagus - NO BARRETTS     2) Normal stomach     3) Duodenitis in the duodenal bulb duodenum     4) GERD           RECOMMENDATIONS:     1) Rx CLO if positive     2) Prilosec OTC 20 mg daily           ______________________________     Wilhemina Bonito. Eda Keys, MD           CC:  Lelon Perla, DO; The Patient           n.     eSIGNED:   John N. Eda Keys at 05/02/2010 09:47 AM  Ronald Bell, Ronald Bell, [id number]  Note: An exclamation mark (!) indicates a result that was not dispersed into the flowsheet. Document Creation Date: 05/02/2010 9:47 AM _______________________________________________________________________  (1) Order result status: Final Collection or observation date-time: 05/02/2010 09:26 Requested date-time:  Receipt date-time:  Reported date-time:  Referring Physician:   Ordering Physician: Fransico Setters [phone]) Specimen Source:  Source: Launa Grill Order Number: [id number] Lab site:

## 2010-05-08 NOTE — Miscellaneous (Signed)
Summary: [facility]est  Clinical Lists Changes  Orders: Added new Test order of TLB-H Pylori Screen Gastric Biopsy (83013-CLOTEST) - Signed 

## 2010-05-14 NOTE — Letter (Signed)
Summary: Patient Notice- Polyp Results  [facility]  [address] [place], [place] [address]   Phone: [phone]  Fax: [phone]        May 06, 2010 MRN: [id number]    EWAN GRAU [address] [place], [place]  [address]    Dear Mr. Falter,  I am pleased to inform you that the colon polyp(s) removed during your recent colonoscopy was (were) found to be benign (no cancer detected) upon pathologic examination.  I recommend you have a repeat colonoscopy examination in ONE year to look for recurrent polyps, as having colon polyps increases your risk for having recurrent polyps or even colon cancer in the future.  Should you develop new or worsening symptoms of abdominal pain, bowel habit changes or bleeding from the rectum or bowels, please schedule an evaluation with either your primary care physician or with me.   Additional information/recommendations:  __ No further action with gastroenterology is needed at this time. Please      follow-up with your primary care physician for your other healthcare      needs.  Please call us if you are having persistent problems or have questions about your condition that have not been fully answered at this time.  Sincerely,  Hilarie Fredrickson MD  This letter has been electronically signed by your physician.  Appended Document: Patient Notice- Polyp Results Letter mailed  Appended Document: Patient Notice- Polyp Results Letter mailed

## 2010-05-14 NOTE — Procedures (Signed)
Summary: Colonoscopy   Colonoscopy  Procedure date:  05/02/2010  Findings:      Location:  [facility].   OLONOSCOPY PROCEDURE REPORT  PATIENT:  Ronald Bell, Ronald Bell  MR#:  [id number] BIRTHDATE:   08/10/1940, 69 yrs. old   GENDER:   male ENDOSCOPIST:   Wilhemina Bonito. Eda Keys, MD REF. BY: Surveillance Program Recall, PROCEDURE DATE:  05/02/2010 PROCEDURE:  Colonoscopy with snare polypectomy x 1                                 EXTENDED SERVICE FOR TECHNICAL DIFFICULTY AND TIME (>30MIN) ASA CLASS:   Class II INDICATIONS: history of pre-cancerous (adenomatous) colon polyps, surveillance and high-risk screening ;mult prior colonoscopies; last 2011 w/ piecemeal large polyp MEDICATIONS:    Fentanyl 75 mcg IV, Versed 7 mg IV  DESCRIPTION OF PROCEDURE:   After the risks benefits and alternatives of the procedure were thoroughly explained, informed consent was obtained.  No rectal exam performed. The LB 180AL [id number] endoscope was introduced through the anus and advanced to the cecum, which was identified by both the appendix and ileocecal valve, without limitations. Time to cecum = 2:28 min.  The quality of the prep was good, using Movi prep.  The instrument was then slowly withdrawn (time = 32:31 min) as the colon was fully examined. <<PROCEDUREIMAGES>>          <<OLD IMAGES>>  FINDINGS:  A 2cm linear sessile polyp was found at the distal ileocecal valve. This was residual from prior exam. Polyp was snared without cautery and removed piecemeal. This was technically difficult given configuration and location. Felt to have complete resection.Retrieval was successful. 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   Retroflexed views in the rectum revealed no abnormalities.    The scope was then withdrawn from the patient and the procedure completed.  COMPLICATIONS:   None ENDOSCOPIC IMPRESSION:  1) Sessile  polyp at the ileocecal valve - removed piecemeal 2) Cecal AVM  ) Otherwise nl colonoscopy   RECOMMENDATIONS:  1) Repeat Colonoscopy in 1 year.  _______________________________ Wilhemina Bonito. Eda Keys, MD  CC: Lelon Perla, DO;  The Patient    Appended Document: Colonoscopy recall ONE yr     Procedures Next Due Date:    Colonoscopy: 04/2011

## 2010-05-28 ENCOUNTER — Telehealth (INDEPENDENT_AMBULATORY_CARE_PROVIDER_SITE_OTHER): Payer: Self-pay | Admitting: *Deleted

## 2010-06-03 NOTE — Progress Notes (Signed)
Summary: refill  Phone Note Refill Request Message from:  Fax from Pharmacy on May 28, 2010 4:42 PM  Refills Requested: Medication #1:  LISINOPRIL-HYDROCHLOROTHIAZIDE 20-12.5 MG  TABS TAKE ONE TABLET DAILY  Medication #2:  AMLODIPINE BESYLATE 10 MG  TABS TAKE ONE TABLET DAILY  Medication #3:  ADVAIR DISKUS 250-50 MCG/DOSE  MISC TAKE ONE PUFF TWICE DAILY [facility] - fax [phone]  Initial call taken by: Okey Regal [place],  May 28, 2010 4:43 PM    Prescriptions: AMLODIPINE BESYLATE 10 MG  TABS (AMLODIPINE BESYLATE) TAKE ONE TABLET DAILY  #30 Tablet x 1   Entered by:   Almeta Monas CMA (AAMA)   Authorized by:   Loreen Freud DO   Signed by:   Almeta Monas CMA (AAMA) on 05/29/2010   Method used:   Faxed to ...       [facility] (retail)       [address]       [address]       [place], [place]  [address]  [place]       Ph: [phone]       Fax: [phone]   RxID:   [id number] ADVAIR DISKUS 250-50 MCG/DOSE  MISC (FLUTICASONE-SALMETEROL) TAKE ONE PUFF TWICE DAILY  #1 x 1   Entered by:   Almeta Monas CMA (AAMA)   Authorized by:   Loreen Freud DO   Signed by:   Almeta Monas CMA (AAMA) on 05/29/2010   Method used:   Faxed to ...       [facility] (retail)       [address]       [address]       [place], [place]  [address]  [place]       Ph: [phone]       Fax: [phone]   RxID:   [id number] LISINOPRIL-HYDROCHLOROTHIAZIDE 20-12.5 MG  TABS (LISINOPRIL-HYDROCHLOROTHIAZIDE) TAKE ONE TABLET DAILY  #30 Tablet x 1   Entered by:   Almeta Monas CMA (AAMA)   Authorized by:   Loreen Freud DO   Signed by:   Almeta Monas CMA (AAMA) on 05/29/2010   Method used:   Faxed to ...       [facility] (retail)       [address]       [address]       [place], [place]  [address]  [place]       Ph: [phone]       Fax: [phone]   RxID:   [id number]

## 2010-06-27 LAB — CBC
HCT: 34 % — ABNORMAL LOW (ref 39.0–52.0)
HCT: 20.6 % — ABNORMAL LOW (ref 39.0–52.0)
HCT: 25.7 % — ABNORMAL LOW (ref 39.0–52.0)
HCT: 35.4 % — ABNORMAL LOW (ref 39.0–52.0)
Hemoglobin: 11.5 g/dL — ABNORMAL LOW (ref 13.0–17.0)
Hemoglobin: 6.6 g/dL — CL (ref 13.0–17.0)
Hemoglobin: 8.5 g/dL — ABNORMAL LOW (ref 13.0–17.0)
MCHC: 32.4 g/dL (ref 30.0–36.0)
MCV: 81.7 fL (ref 78.0–100.0)
MCV: 74.4 fL — ABNORMAL LOW (ref 78.0–100.0)
MCV: 77.9 fL — ABNORMAL LOW (ref 78.0–100.0)
Platelets: 294 10*3/uL (ref 150–400)
Platelets: 363 10*3/uL (ref 150–400)
RBC: 4.16 MIL/uL — ABNORMAL LOW (ref 4.22–5.81)
RBC: 2.78 MIL/uL — ABNORMAL LOW (ref 4.22–5.81)
RBC: 4.33 MIL/uL (ref 4.22–5.81)
RDW: 20 % — ABNORMAL HIGH (ref 11.5–15.5)
RDW: 20.5 % — ABNORMAL HIGH (ref 11.5–15.5)
WBC: 10.6 10*3/uL — ABNORMAL HIGH (ref 4.0–10.5)
WBC: 12.1 10*3/uL — ABNORMAL HIGH (ref 4.0–10.5)

## 2010-06-27 LAB — HEPATIC FUNCTION PANEL
ALT: 22 U/L (ref 0–53)
Alkaline Phosphatase: 63 U/L (ref 39–117)
Bilirubin, Direct: <0.1 mg/dL (ref 0.0–0.3)
Total Bilirubin: 0.3 mg/dL (ref 0.3–1.2)

## 2010-06-27 LAB — BASIC METABOLIC PANEL
BUN: 12 mg/dL (ref 6–23)
CO2: 22 mEq/L (ref 19–32)
Chloride: 101 mEq/L (ref 96–112)
GFR calc Af Amer: >60 mL/min (ref >60)
GFR calc non Af Amer: >60 mL/min (ref >60)
Glucose, Bld: 205 mg/dL — ABNORMAL HIGH (ref 70–99)
Potassium: 3.5 mEq/L (ref 3.5–5.1)
Potassium: 4.4 mEq/L (ref 3.5–5.1)
Sodium: 133 mEq/L — ABNORMAL LOW (ref 135–145)
Sodium: 137 mEq/L (ref 135–145)

## 2010-06-27 LAB — PHOSPHORUS: Phosphorus: 4 mg/dL (ref 2.3–4.6)

## 2010-06-27 LAB — CARDIAC PANEL(CRET KIN+CKTOT+MB+TROPI)
CK, MB: 2 ng/mL (ref 0.3–4.0)
Relative Index: INVALID (ref 0.0–2.5)
Relative Index: INVALID (ref 0.0–2.5)
Total CK: 72 U/L (ref 7–232)
Total CK: 78 U/L (ref 7–232)
Troponin I: 0.02 ng/mL (ref 0.00–0.06)

## 2010-06-27 LAB — CROSSMATCH

## 2010-07-16 ENCOUNTER — Other Ambulatory Visit: Payer: Self-pay

## 2010-07-16 MED ORDER — ATORVASTATIN CALCIUM 40 MG PO TABS: 40.0000 mg | ORAL_TABLET | Freq: Every day | ORAL | Status: DC

## 2010-07-18 ENCOUNTER — Ambulatory Visit: Payer: PRIVATE HEALTH INSURANCE | Admitting: Family Medicine

## 2010-07-21 ENCOUNTER — Encounter: Payer: Self-pay | Admitting: Family Medicine

## 2010-07-25 ENCOUNTER — Encounter: Payer: Self-pay | Admitting: Family Medicine

## 2010-07-25 ENCOUNTER — Other Ambulatory Visit: Payer: Self-pay | Admitting: Cardiology

## 2010-07-25 ENCOUNTER — Ambulatory Visit (INDEPENDENT_AMBULATORY_CARE_PROVIDER_SITE_OTHER): Payer: BC Managed Care – PPO | Admitting: Family Medicine

## 2010-07-25 VITALS — BP 126/68 | HR 84 | Wt 137.0 lb

## 2010-07-25 DIAGNOSIS — E785 Hyperlipidemia, unspecified: Secondary | ICD-10-CM

## 2010-07-25 DIAGNOSIS — F528 Other sexual dysfunction not due to a substance or known physiological condition: Secondary | ICD-10-CM

## 2010-07-25 DIAGNOSIS — D649 Anemia, unspecified: Secondary | ICD-10-CM

## 2010-07-25 DIAGNOSIS — I6529 Occlusion and stenosis of unspecified carotid artery: Secondary | ICD-10-CM

## 2010-07-25 DIAGNOSIS — Z125 Encounter for screening for malignant neoplasm of prostate: Secondary | ICD-10-CM

## 2010-07-25 DIAGNOSIS — D509 Iron deficiency anemia, unspecified: Secondary | ICD-10-CM

## 2010-07-25 DIAGNOSIS — Z0000 Encounter for general adult medical examination without abnormal findings: Secondary | ICD-10-CM

## 2010-07-25 DIAGNOSIS — J449 Chronic obstructive pulmonary disease, unspecified: Secondary | ICD-10-CM

## 2010-07-25 DIAGNOSIS — N529 Male erectile dysfunction, unspecified: Secondary | ICD-10-CM

## 2010-07-25 DIAGNOSIS — I10 Essential (primary) hypertension: Secondary | ICD-10-CM

## 2010-07-25 LAB — CBC WITH DIFFERENTIAL/PLATELET
Basophils Absolute: 0 10*3/uL (ref 0.0–0.1)
Basophils Relative: 0.3 % (ref 0.0–3.0)
Eosinophils Absolute: 0.3 10*3/uL (ref 0.0–0.7)
Lymphocytes Relative: 12 % (ref 12.0–46.0)
MCHC: 33.4 g/dL (ref 30.0–36.0)
Neutrophils Relative %: 73 % (ref 43.0–77.0)
Platelets: 285 10*3/uL (ref 150.0–400.0)
RBC: 3.41 Mil/uL — ABNORMAL LOW (ref 4.22–5.81)

## 2010-07-25 LAB — BASIC METABOLIC PANEL
CO2: 28 mEq/L (ref 19–32)
Chloride: 99 mEq/L (ref 96–112)
Creatinine, Ser: 0.6 mg/dL (ref 0.4–1.5)
Potassium: 4.7 mEq/L (ref 3.5–5.1)

## 2010-07-25 LAB — LIPID PANEL
Cholesterol: 134 mg/dL (ref 0–200)
LDL Cholesterol: 65 mg/dL (ref 0–99)
VLDL: 19.8 mg/dL (ref 0.0–40.0)

## 2010-07-25 LAB — POCT URINALYSIS DIPSTICK
Bilirubin, UA: NEGATIVE
Ketones, UA: NEGATIVE
Leukocytes, UA: NEGATIVE
Protein, UA: NEGATIVE
Spec Grav, UA: 1.02

## 2010-07-25 LAB — PSA: PSA: 1.23 ng/mL (ref 0.10–4.00)

## 2010-07-25 LAB — HEPATIC FUNCTION PANEL
ALT: 45 U/L (ref 0–53)
AST: 28 U/L (ref 0–37)
Alkaline Phosphatase: 55 U/L (ref 39–117)
Bilirubin, Direct: 0 mg/dL (ref 0.0–0.3)
Total Bilirubin: 0.6 mg/dL (ref 0.3–1.2)

## 2010-07-25 MED ORDER — IRON 325 (65 FE) MG PO TABS: 1.0000 | ORAL_TABLET | Freq: Two times a day (BID) | ORAL | Status: DC

## 2010-07-25 MED ORDER — LISINOPRIL-HYDROCHLOROTHIAZIDE 20-12.5 MG PO TABS: 1.0000 | ORAL_TABLET | Freq: Every day | ORAL | Status: DC

## 2010-07-25 MED ORDER — FLUTICASONE-SALMETEROL 250-50 MCG/DOSE IN AEPB: 1.0000 | INHALATION_SPRAY | Freq: Two times a day (BID) | RESPIRATORY_TRACT | Status: DC

## 2010-07-25 MED ORDER — ATORVASTATIN CALCIUM 40 MG PO TABS: 40.0000 mg | ORAL_TABLET | Freq: Every day | ORAL | Status: DC

## 2010-07-25 MED ORDER — AMLODIPINE BESYLATE 10 MG PO TABS: 10.0000 mg | ORAL_TABLET | Freq: Every day | ORAL | Status: DC

## 2010-07-25 NOTE — Patient Instructions (Signed)

## 2010-07-25 NOTE — Progress Notes (Signed)
  Subjective:    Patient ID: Ronald Bell, male    DOB: 03/08/1941, 70 y.o.   MRN: [id number]  HPI Pt here for f/u cholesterol and HTN.  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  No other complaints.   Review of Systems as above   Objective:   Physical Exam  Constitutional: He is oriented to person, place, and time. He appears well-developed and well-nourished.  Neck: Normal range of motion. Neck supple. Carotid bruit is not present (b/l).  Cardiovascular: Normal rate, regular rhythm and normal heart sounds.   Pulmonary/Chest: Effort normal and breath sounds normal. No respiratory distress. He has no wheezes.  Musculoskeletal: He exhibits no edema.  Lymphadenopathy:    He has no cervical adenopathy.  Neurological: He is alert and oriented to person, place, and time.  Psychiatric: He has a normal mood and affect. His behavior is normal.          Assessment & Plan:

## 2010-07-28 ENCOUNTER — Ambulatory Visit (INDEPENDENT_AMBULATORY_CARE_PROVIDER_SITE_OTHER): Payer: BC Managed Care – PPO | Admitting: Cardiology

## 2010-07-28 DIAGNOSIS — G458 Other transient cerebral ischemic attacks and related syndromes: Secondary | ICD-10-CM

## 2010-07-28 DIAGNOSIS — I6529 Occlusion and stenosis of unspecified carotid artery: Secondary | ICD-10-CM

## 2010-07-30 ENCOUNTER — Telehealth: Payer: Self-pay

## 2010-07-30 ENCOUNTER — Encounter: Payer: Self-pay | Admitting: Family Medicine

## 2010-07-30 DIAGNOSIS — I771 Stricture of artery: Secondary | ICD-10-CM

## 2010-07-30 NOTE — Telephone Encounter (Signed)
Spoke with Clarice and left a message for the patient to give me a call back     KP

## 2010-07-30 NOTE — Telephone Encounter (Signed)
Message copied by Almeta Monas on Wed Jul 30, 2010  2:35 PM ------      Message from: Loreen Freud      Created: Mon Jul 28, 2010 10:58 PM       Is pt taking iron?  He is anemic again.

## 2010-08-04 NOTE — Progress Notes (Signed)
  Subjective:    Patient ID: Ronald Bell, male    DOB: [birth year], 70 y.o.   MRN: [id number]  HPI    Review of Systems     Objective:   Physical Exam  Constitutional: He is oriented to person, place, and time. He appears well-developed and well-nourished.  Cardiovascular: Normal rate and regular rhythm.   Pulmonary/Chest: Effort normal and breath sounds normal.  Musculoskeletal: He exhibits no edema.  Neurological: He is alert and oriented to person, place, and time.  Psychiatric: He has a normal mood and affect. His behavior is normal.          Assessment & Plan:

## 2010-08-04 NOTE — Telephone Encounter (Signed)
hbg low and pt is taking daily iron----  Check guiac cards Recheck cbcd end this week --beg next week

## 2010-08-04 NOTE — Assessment & Plan Note (Signed)
Check labs con't meds 

## 2010-08-04 NOTE — Telephone Encounter (Signed)
Carotid results---Worsening L subclavian art stenosis----refer to [facility]--- left message with wife to call back     KP

## 2010-08-04 NOTE — Telephone Encounter (Signed)
Pt states that he is currently taking 500 mg of iron daily.  Pt aware Referral put in.Marland Kitchen

## 2010-08-04 NOTE — Assessment & Plan Note (Signed)
Check labs con't iron  

## 2010-08-05 NOTE — Op Note (Signed)
NAME:  Ronald Bell, Ronald Bell NO.:  [id number]   MEDICAL RECORD NO.:  [id number]          PATIENT TYPE:  AMB   LOCATION:  ENDO                         FACILITY:  [facility]   PHYSICIAN:  Georgiana Spinner, M.D.    DATE OF BIRTH:  11-27-1940   DATE OF PROCEDURE:  DATE OF DISCHARGE:                               OPERATIVE REPORT   PROCEDURE:  Upper endoscopy.   INDICATIONS:  Abdominal discomfort, GERD.   ANESTHESIA:  Fentanyl 75 mcg, Versed 9 mg.   PROCEDURE:  With the patient mildly sedated in the left lateral  decubitus position the Pentax videoscopic endoscope was inserted in the  mouth and passed under direct vision through the esophagus which  appeared normal, into the stomach fundus, body, antrum appeared  relatively normal at first glance.  The duodenal bulb showed changes of  inflammation consistent with duodenitis, moderate and the second portion  of the duodenum appeared normal.  From this point, the endoscope was  slowly withdrawn taking circumferential views of the duodenal mucosa  until the endoscope had been pulled back into the stomach, placed in  retroflexion to view the stomach from below.  The endoscope was then  straightened and withdrawn taking circumferential views of the remaining  gastric and esophageal mucosa, stopping to take biopsies along the way.  One from the prepyloric area where the mucosa was somewhat prominent at  approximately 5 o'clock and also some from the proximal stomach.  The  endoscope was withdrawn.  The patient's vital signs and pulse oximeter  remained stable.  The patient tolerated the procedure well without  apparent complication.   FINDINGS:  Prominence of the prepyloric area at 5 o'clock, duodenitis  and biopsies taken for H. pylori.  Await biopsy reports.  The patient  will call me for results and follow-up with me as needed as an  outpatient.  Proceed to colonoscopy.           ______________________________  Georgiana Spinner,  M.D.     GMO/MEDQ  D:  10/09/2008  T:  10/10/2008  Job:  [id number]

## 2010-08-05 NOTE — Assessment & Plan Note (Signed)
[facility]                                 ON-CALL NOTE   Ronald Bell, Ronald Bell                    MRN:          [id number]  DATE:07/01/2007                            DOB:          1941-01-31    PHONE NUMBER:  [phone]   PRIMARY:  Dr. Laury Axon   SUBJECTIVE:  Clarice, his wife, is calling today stating that he is  having neck pain and needs a refill of Percocet.   ASSESSMENT AND PLAN:  Politely told the patient that we do not refill  narcotics after hours.  She was very upset and hung up phone during our  conversation.     Kerby Nora, MD  Electronically Signed    AB/MedQ  DD: 07/01/2007  DT: 07/01/2007  Job #: [id number]

## 2010-08-05 NOTE — Op Note (Signed)
NAME:  Ronald Bell, Ronald Bell NO.:  [id number]   MEDICAL RECORD NO.:  [id number]          PATIENT TYPE:  AMB   LOCATION:  ENDO                         FACILITY:  [facility]   PHYSICIAN:  Georgiana Spinner, M.D.    DATE OF BIRTH:  Aug 24, 1940   DATE OF PROCEDURE:  10/09/2008  DATE OF DISCHARGE:                               OPERATIVE REPORT   PROCEDURE:  Colonoscopy.   INDICATIONS:  Colon cancer screening.   ANESTHESIA:  None further given.   With the patient mildly sedated in the left lateral decubitus position a  rectal exam was performed which was unremarkable.  Subsequently, the  Pentax videoscopic pediatric colonoscope was inserted in the rectum,  passed under direct vision with pressure applied to the abdomen.  We  reached the cecum identified by the base of cecum and ileocecal valve.  There was a large AVM in the cecum which was photographed.  From this  point, the colonoscope was then slowly withdrawn taking circumferential  views of colonic mucosa stopping only in the transverse colon were polyp  was seen, photographed, and removed using hot biopsy forceps technique  setting of 20/150 blended current.  We next stopped in the descending  colon a short distance away where a second polyp was seen.  It too was  photographed and it too was removed using hot biopsy forceps technique.  The endoscope was withdrawn all the way to the rectum which appeared  normal on direct and retroflexed view.  The endoscope was straightened  and withdrawn.  The patient's vital signs and pulse oximeter remained  stable.  The patient tolerated the procedure well without apparent  complication.   FINDINGS:  Two small polyps as noted.  Prep was slightly suboptimal, and  it was difficult to suction the clear yellowish material as it tended to  pull, and when we suctioned, we suctioned mucosa but no gross lesions  seen otherwise.   PLAN:  Await biopsy report.  The patient will call me for results  and  follow-up with me as needed as an outpatient.           ______________________________  Georgiana Spinner, M.D.     GMO/MEDQ  D:  10/09/2008  T:  10/10/2008  Job:  [id number]   cc:   Lelon Perla, DO  [address] [place], [place] [address]

## 2010-08-05 NOTE — Telephone Encounter (Signed)
Spoke with Clarice (ok per Hippa) and she voiced understanding.Marland KitchenMarland KitchenMarland KitchenStool cards mailed  And appt was already scheduled per patient   KP

## 2010-08-08 NOTE — Procedures (Signed)
[place]  Patient:    Ronald Bell, Ronald Bell                    MRN: [id number] Proc. Date: 12/05/99 Adm. Date:  [id number] Attending:  Sabino Gasser CC:         [name]. [name], M.D.   Procedure Report  PROCEDURE:  Colonoscopy.  INDICATION:  Hemoccult positivity.  ANESTHESIA:  Demerol 80 mg and Versed 7 mg given intravenously in divided dose.  DESCRIPTION OF PROCEDURE:  With the patient mildly sedated in the left lateral decubitus position, a rectal examination was performed which was unremarkable. Subsequently, the Olympus videoscopic colonoscope was inserted in the rectum and passed under direct vision to the cecum.  The cecum identified by ileocecal valve and appendiceal orifice both of which were photographed.  From this point, the colonoscope was slowly withdrawn taking circumferential views of the entire colonic mucosa, stopping only at 20 cm from the anal verge.  At which point, two small polyps were seen.  They were flat, probably 5 mm in diameter, photographed, and removed using hot biopsy forceps technique setting with a 20/20 blended current.  The endoscope was then pulled back to the rectum which appeared normal on direct view and on retroflex view showed small polyps in the colon which were removed using hot biopsies forceps techniques.  No other pathology was noted.  The endoscope was straightened and withdrawn.  The patients vital signs and pulse oximeter remained stable.  The patient tolerated the procedure well without apparent complications.  FINDINGS:  Polyps at 20 cm and in the rectum seen only on retroflex view. Otherwise unremarkable colonoscopic examination to the cecum.  PLAN:  Endoscopy to evaluate further.  See clinical note for details. DD:  12/05/99 TD:  12/06/99 Job: [id number]

## 2010-08-08 NOTE — Op Note (Signed)
NAME:  Ronald Bell, Ronald Bell                     ACCOUNT NO.:  [id number]   MEDICAL RECORD NO.:  [id number]                   PATIENT TYPE:  AMB   LOCATION:  ENDO                                 FACILITY:  [facility]   PHYSICIAN:  Georgiana Spinner, M.D.                 DATE OF BIRTH:  1941-03-21   DATE OF PROCEDURE:  DATE OF DISCHARGE:                                 OPERATIVE REPORT   PROCEDURE:  Colonoscopy.   INDICATIONS:  Colon polyps.   ANESTHESIA:  Demerol 30, Versed 3 mg.   PROCEDURE:  With the patient mildly sedated in the left lateral decubitus  position, the Olympus video-scopic colposcope was inserted into the rectum  and passed under direct vision after a normal rectal exam to the cecum,  identified by ileocecal valve.  In the base of the cecum, where there was an  AVM seen and photographed only.  From this point, the colonoscope was slowly  withdrawn, taking circumferential views of colonic mucosa, stopping in the  rectum, which appeared normal on direct view and showed hemorrhoids on  retroflex view.  The colonoscope was straightened and withdrawn.  The  patient's vital signs and pulse oximeter remained stable.  The patient  tolerated the procedure without apparent complications.   FINDINGS:  AVM of cecum, photographed only.  Internal hemorrhoids, otherwise  an unremarkable colonoscopic examination.   PLAN:  See endoscopy note for further details of followup.                                               Georgiana Spinner, M.D.    GMO/MEDQ  D:  02/22/2003  T:  02/22/2003  Job:  [id number]

## 2010-08-08 NOTE — Op Note (Signed)
NAME:  Ronald Bell, Ronald Bell                     ACCOUNT NO.:  [id number]   MEDICAL RECORD NO.:  [id number]                   PATIENT TYPE:  AMB   LOCATION:  ENDO                                 FACILITY:  [facility]   PHYSICIAN:  Georgiana Spinner, M.D.                 DATE OF BIRTH:  08-10-1940   DATE OF PROCEDURE:  DATE OF DISCHARGE:                                 OPERATIVE REPORT   ADDENDUM:  I meant to Courtesy Copy the endoscopy and colonoscopy that were  just done and dictated and to Dr. Leanne Chang.                                               Georgiana Spinner, M.D.    GMO/MEDQ  D:  02/22/2003  T:  02/22/2003  Job:  [id number]

## 2010-08-08 NOTE — Op Note (Signed)
NAME:  Ronald Bell, Ronald Bell                     ACCOUNT NO.:  [id number]   MEDICAL RECORD NO.:  [id number]                   PATIENT TYPE:  AMB   LOCATION:  ENDO                                 FACILITY:  [facility]   PHYSICIAN:  Georgiana Spinner, M.D.                 DATE OF BIRTH:  [birth year]   DATE OF PROCEDURE:  DATE OF DISCHARGE:                                 OPERATIVE REPORT   PROCEDURE:  Upper endoscopy.   INDICATIONS:  GERD.   ANESTHESIA:  Demerol 70, Versed 7 mg.   PROCEDURE:  With the patient mildly sedated in the left lateral decubitus  position, the Olympus video-scopic endoscope was inserted into the mouth and  passed under direct vision through the esophagus, and the distal esophagus  was photographed and biopsied.  This may be a normal variant or Barrett.  We  entered into the stomach - fundus, body, antrum, duodenal bulb, second  portion of the duodenum all appeared normal.  From this point, the endoscope  was slowly withdrawn, taking circumferential views of the duodenal mucosa  until the endoscope had pulled back into the stomach and placed in  retroflexion to view the stomach from below.  The endoscope was straightened  and withdrawn taking circumferential views of the remaining gastric and  esophageal mucosa.  The patient's vital signs and pulse oximeter remained  stable.  The patient tolerated the procedure without apparent complications.   FINDINGS:  Area of distal esophagus biopsied, await biopsy report.  The  patient will call me for results and follow up with me as an outpatient.  Proceed to colonoscopy.                                               Georgiana Spinner, M.D.    GMO/MEDQ  D:  02/22/2003  T:  02/22/2003  Job:  [id number]

## 2010-08-08 NOTE — Cardiovascular Report (Signed)
[place]. [facility]  Patient:    Ronald Bell, Ronald Bell                    MRN: [id number] Proc. Date: 10/24/99 Adm. Date:  [id number] Attending:  Corliss Marcus CC:         Francisca December, M.D.             [name] [name], M.D.             Cardiac Catheterization Laboratory                        Cardiac Catheterization  PROCEDURES PERFORMED: 1. Left heart catheterization. 2. Coronary angiography. 3. Left ventriculogram. 4. Ascending aortogram. 5. Distal aortogram. 6. Selective renal arteriography. 7. Selective left common iliac arteriography.  INDICATIONS:  Mr. Ronald Bell is a 70 year old man who is referred for evaluation of abnormal ECG.  This prompted an exercise Cardiolite, which revealed partially reversible inferior defect, consistent with a previous infarction with peri-infarctional ischemia.  The patient is generally asymptomatic with respect to the cardiovascular system, denying both claudication and angina pectoris.  He states his legs will tire quickly.  On examination he has been found to have left carotid and supraclavicular bruit, abdominal bruit and bilateral femoral bruits.  He is also hypertensive. Therefore, assessment will be made for the degree of his peripheral vascular disease as well.  PROCEDURAL NOTE:  A 5-French catheter sheath was inserted percutaneously into the right femoral artery utilizing an anterior approach over a guiding J-wire.  A 110 cm pigtail catheter was used to perform measurements of pressure in the ascending aorta and left ventricle, as well as left ventriculography, ascending aortography and mid abdominal aortography.  Then 45 cc was injected at 13 cc/sec for the left ventriculogram; 45 cc at 16 cc/sec for the ascending aortogram, and 39 cc at 18 cc/sec for the abdominal aortogram.  Then 5-French #4 left and right Judkins catheters were used to visualize both coronary arteries in multiple LAO and RAO  projections.  The right Judkins catheter was used to measure pressures across a mild stenosis in the left subclavian, as well as perform selective hand injection left renal artery angiography, and measure pressures.  Finally, the 5-French catheter sheath was exchanged over a long guiding J-wire for a 6-French catheter sheath.  The 6-French LIMA catheter was used to visualize the left common iliac artery by hand injection.  At the completion of the procedure the catheter and sheath was removed and hemostasis was achieved by utilization of the Perclose percutaneous closure method.  There was good hemostasis.  The patient was then transferred to the recovery area in stable condition with intact distal pulses.  FLUOROSCOPY TIME:  11.4 minutes.  TOTAL CONTRAST UTILIZED:  250 cc Omnipaque.  HEMODYNAMICS: 1. Systemic arterial pressure:  170/50 with mean 72 mmHg.  There was no    systolic gradient across the aortic valve. 2. Left ventricular end-diastolic pressure:  10 mmHg pre- and    post-ventriculogram.  ANGIOGRAPHY:  VENTRICULOGRAM:  The left ventriculogram demonstrated normal global systolic function, with a calculated ejection fraction utilizing a single-plane cineangiographic method of 60%.  There was left and right coronary calcification seen.  There was no mitral regurgitation.  There were no regional wall motion abnormalities.  CORONARY FINDINGS:  There was a right-dominant coronary system present. 1. LEFT MAIN CORONARY:  Was normal. 2. LEFT ANTERIOR DESCENDING ARTERY (and its branches):  Had diffuse mid  vessel stenosis of at least 40%.  There was heavy calcification seen in    the mid portion.  There was no subtotal or critical stenoses seen.  A    single, large diagonal branch arose and is free of significant obstruction.    The ongoing LAD is relatively small and barely reaches the apex. 3. LEFT CIRCUMFLEX ARTERY (and its branches):  Minimally diseased; again,    there  is some calcification seen in this vessel.  It gives rise to a small    first and second marginal.  The third marginal is of moderate size.  The    fourth marginal is large and is on the true obtuse margin.  These vessels    contain no significant obstructions. 4. RIGHT CORONARY ARTERY (and its branches):  Minimally diseased.  The vessel    contains a mid portion stenosis of about 30-40%.  There is also a    proximal tubular 30% stenosis at the mid and proximal segments.  The vessel    gives rise to a large posterior descending artery and a large    posterolateral segment, as well as posterolateral branch.  No significant    obstructions are seen within these vessels.  ASCENDING AORTOGRAM:  Reveals heavy calcification at the origin of the innominate artery, the left carotid and the left subclavian.  There are 30% stenoses at the origin of the innominate, and at the left common carotid.  The subclavian has a 30-40% stenosis at its origin.  The aorta itself is not enlarged, but atherosclerotic changes can be seen throughout the wall of the vessel.  The distal aorta, again, is diffusely diseased and irregular; with significant atherosclerotic changes throughout.  It gives rise to both iliacs; which are also diffusely diseased, but without clear significant obstruction.  There did appear to be a significant obstruction of the left common iliac, but this was not borne out of selective angiography.  The right renal artery was widely patent.  The left renal artery had a 50% stenosis by quantitative analysis.  There was a 25 mm gradient across this stenosis.  Selective left common iliac injection revealed a 40% proximal stenosis.  The internal iliac is small and highly diseased, but without clear significant obstruction.  The right internal iliac does appear to have a 50-60% stenosis, just before the initiation of the femoral artery.  Also, the right internal iliac artery is completely  occluded, and is fed by collateral flow.  A large  collateral arises from the distal aorta.  FINAL IMPRESSION: 1. Diffuse atherosclerosis, moderately severe. 2. Intact left ventricular size, as well as systolic function.    Ejection fraction of 60%. 3. Diffuse calcific coronary atherosclerosis, without significant    physiologic obstruction. 4. Left subclavian, innominate and left common carotid with 30% stenoses. 5. There is 50% left renal artery stenosis.  Borderline renal or vascular    hypertension (controlled on single drug therapy). 6. Diffuse distal atherosclerosis, with borderline significant obstruction    in both iliac arteries. 7. Completely occluded right internal iliac artery.  PLAN/RECOMMENDATIONS:  At this point further endovascular treatment is not indicated.  The patient needs risk factor modification and is advised strongly to discontinue tobacco abuse.  He also should have his lipids treated aggressively to an LDL less than 100, and preferably at around 70.  Will enroll in our lipid clinic for follow up at this goal.  Would recommend nicotine patches and Zyban for treatment of nicotine addiction. DD:  10/24/99 TD:  10/24/99 Job: [id number]

## 2010-08-08 NOTE — Assessment & Plan Note (Signed)
[facility]                        [place] OFFICE NOTE   [place] Ronald Bell                  MRN:          [id number]  DATE:04/16/2006                            DOB:          04-Sep-1940    REASON FOR VISIT:  Nose-bleed.   Ronald Bell is a 70 year old male, who reports that he has had three  episodes of nose-bleeds.  One occurred spontaneously at work and two  other episodes have been at night.  He states that, when the fan is  blowing on him overnight, he has noted nose-bleeds.  He denies any pain,  congestion or any other areas of bleeding.  Denies any easy bruising.  The patient does take an 81 mg aspirin.  Last episode of nose-bleed was  this morning.   MEDICATIONS:  1. Advair 250/50.  2. Lisinopril/hydrochlorothiazide 25/12.5.  3. Amlodipine 10 mg.  4. Lipitor 40 mg.  5. Multivitamin.  6. Aspirin 81 mg.   OBJECTIVE:  Weight 135.8, pulse 72, blood pressure 142/68.  GENERAL:  We have a pleasant male, in no acute distress, answers  questions appropriately.  HEENT:  Auditory canal was unremarkable.  Tympanic membrane was clear.  Nasal mucosa was significant for being dry.  There was dry blood in the  left nostril.  No active bleeding site was noted.  Oropharynx was  unremarkable.   IMPRESSION:  The patient is a 70 year old with epistaxis.   PLAN:  1. Discussed with patient that the most likely reason for the      epistaxis is dry mucous membrane.  Recommended saline nasal spray      two or three times a day and in the evening.  Additionally      recommended a humidifier in his bedroom.  Also recommended the      patient to avoid direct exposure of dry, hot air blown on his face.  2. Advised patient, if he has recurrence, he needs to call us for      further recommendation.  At that point, we will refer to ENT for      possible cauterization.  3. I will obtain a CBC as well as a PT and INR.   The patient expressed  understanding and will follow up as scheduled for  his routine blood pressure and cholesterol check.     Leanne Chang, M.D.  Electronically Signed   LA/MedQ  DD: 04/16/2006  DT: 04/16/2006  Job #: [id number]

## 2010-08-08 NOTE — H&P (Signed)
. [facility]  Patient:    Ronald Bell                    MRN: [id number] Adm. Date:  [id number] Attending:  Corliss Marcus Dictator:   Rozell Searing, P.A. CC:         Dr. Louellen Bell, Ronald Bell  Ronald Bell, M.D.   History and Physical  PRIMARY CARE Ronald Bell:  Dr. Louellen Bell, Ronald Bell.  DATE OF BIRTH:  April 20, 1940.  HISTORY OF PRESENT ILLNESS:  Ronald Bell is a very pleasant 70 year old male with a long, heavy history of tobacco use, moderately heavy ETOH use, and hypertension under treatment for three to four years.  He is referred for screening stress Cardiolyte secondary to abnormal EKG by primary care revealing atrial fibrillation.  His follow-up Cardiolyte revealed ejection fraction decreased at 42% with global hypokinesis and findings suspicious with inferior wall ischemia.  He maintained sinus rhythm throughout the course of the test.  The patient specifically denies problems with chest pain nor tightness, just episodic "twinges" lasting only a few seconds in left anterior chest and are not accompanied by other symptoms.  He does have baseline DOE with mowing about half the lawn, which has been present over the last few years, and which he attributes to his tobacco use.  No other specific complaints.  He is asymptomatic with his paroxysmal atrial fibrillation.  Because of baseline history of PAF as well as evidence of possible inferior wall ischemia, the patient was counseled to undergo and has accepted plans for cardiac catheterization with possible intervention if indicated and able.  CARDIAC RISK FACTORS:  Age, positive family history of CAD, male sex, long and heavy use of tobacco smoking.  PAST MEDICAL HISTORY: 1. Hypertension for three to four years. 2. Tobacco use/COPD. 3. OB-positive stool by guaiac, follow up with Ronald Bell in the near future for    a possible colonoscopy. 4.  Paroxysmal versus lone atrial fibrillation discovered serendipitously    during CPX.  The patient denies a history of diabetes mellitus, cancer, nor thyroid disease.  Negative arthritis.  PAST SURGICAL HISTORY: 1. Circumcision 1-1/2 years ago. 2. History of tonsillectomy as a child.  ALLERGIES:  No known drug allergies.  Okay with seafood, shellfish, and iodine products.  MEDICATIONS: 1. Norvasc 10 mg p.o. q.d. (increased from 5 mg q.d. about two months    earlier). 2. Aspirin, enteric-coated, 81 mg a day. 3. Centrum once daily. 4. Vitamin C alternating with vitamin E every other day.  HABITS/SOCIAL HISTORY:  Tobacco:  Smokes two packs per day for 40 years. ETOH:  Drinks three beers every evening and about two six-packs on the weekend.  Caffeine:  Five cups of coffee a day.  The patient works at [employer] as an Art gallery manager; the company makes Office [employer].  He is married for seven years (second marriage), has four children by prior marriage, one of whom (daughter) died of a heart attack at age 37.  His two surviving daughters and son are alive and well.  FAMILY HISTORY:  Father deceased at age 47 of kidney problems.  Mother deceased at age 62 of emphysema.  One brother with hypertension.  One sister okay.  REVIEW OF SYSTEMS:  Wears glasses.  Has upper and lower full false teeth. Denies problems with dysphagia to food or fluid.  Negative hearing problems. Does wear eyeglasses.  Denies dysuria nor hematuria.  Negative constipation, diarrhea, nor  black or red blood p.r.  Was noted with guaiac-positive stool at primary cares office and is scheduled for follow-up GI evaluation by Ronald Bell. Negative pedal edema.  No orthopnea, PND, nor pedal edema.  PHYSICAL EXAMINATION:  VITAL SIGNS:  Blood pressure is 148/76 with heart rate 97 and regular, respiratory rate 16, temperature 97.0.  Height 5 feet 4 inches, weight 132 pounds.  GENERAL:  He is a slender 70 year old male in no  apparent distress, well-groomed and pleasantly conversive.  His wife accompanies him today.  HEENT:  Brisk bilateral carotid upstroke.  Right carotid bruit as well as right supraclavicular bruit.  No bruits appreciated left side.  CHEST:  Lung sounds clear with equal bilateral excursions.  Negative CVA tenderness.  CARDIAC:  Regular rate and rhythm with question of soft gallop.  Normal S1 and S2.  No rub.  ABDOMEN:  Soft, nontender.  There are normal bowel sounds.  Does have bilateral lower quadrant bruit as well as bilateral femoral bruit.  No masses nor organomegaly appreciated.  No tenderness nor guarding to palpation.  EXTREMITIES:  +2/4 bilateral radial, femoral, right and left dorsalis pedis, right posterior tibial.  Unable to appreciate left posterior tibial pulse. Negative pedal edema.  NEUROLOGIC:  Cranial nerves II-XII grossly intact. Alert and oriented x 3.  GENITAL/RECTAL:  Deferred.  LABORATORY DATA:  Sodium 138, potassium 4.2, chloride 97, CO2 of 30, BUN of 15, creatinine 0.9, and glucose 130.  Hemoglobin is 16.9 with WBC of 10.7 and platelets of 287.  Protime is 11.8 with INR of 1.02 and PTT of 30.  Cardiolyte from October 16, 1999 (myocardial perfusion scan) revealed global hypokinesis with an EF of 42%.  No regional wall motion abnormalities.  Suggestion of inferior wall ischemia.  Chest x-ray from October 03, 1999, revealed COPD, no active disease.  EKG from date of Cardiolyte revealed NSR at 91 beats per minute with inferior/lateral ST depression/flattening.  IMPRESSION: 1. History of paroxysmal/lone atrial fibrillation and follow-up myocardial    perfusion scan suggestive of inferior ischemia in this 70 year old male    with a long history of tobacco use and moderately heavy ETOH use.  His    daughter did die of a heart attack at age 14.  His clinical exam is    suggestive of peripheral vascular disease with right carotid bruit as well     as suggestion of renal  and femoral peripheral vascular disease by positive    bruits. 2. Tobacco use.  Interested in smoking cessation.  Has tried Zyban in the    past with some limited success. 3. Hypertension.  Good control on current medical regimen.  PLAN: 1. Patient has been counseled to undergo and accepted plans for coronary    angiography with possible percutaneous intervention if indicated and able.    The risks, contraindications, complications, benefits, and alternatives of    the procedure were discussed in detail with patient and his wife.  Mr.    Whilden indicates his questions and concerns have been addressed and is    agreeable to  proceed. DD:  10/24/99 TD:  10/24/99 Job: [id number]

## 2010-08-08 NOTE — Op Note (Signed)
NAME:  CHIN, WACHTER                     ACCOUNT NO.:  [id number]   MEDICAL RECORD NO.:  [id number]                   PATIENT TYPE:  OIB   LOCATION:  2899                                 FACILITY:  [facility]   PHYSICIAN:  Larina Earthly, M.D.                 DATE OF BIRTH:  [birth year]   DATE OF PROCEDURE:  07/25/2003  DATE OF DISCHARGE:                                 OPERATIVE REPORT   PREOPERATIVE DIAGNOSIS:  Limiting claudication of left leg.   POSTOPERATIVE DIAGNOSIS:  Limiting claudication of left leg.   PROCEDURES:  1. Aortogram with bilateral lower extremity runoff.  2. Left common iliac artery angioplasty and Genesis 7 x 18 mm stent     placement.   SURGEON:  Larina Earthly, M.D.   ANESTHESIA:  1% lidocaine local.   COMPLICATIONS:  None.   DISPOSITION:  To holding area stable.   PROCEDURE IN DETAIL:  The patient was taken to the peripheral vascular  catheterization lab and placed in the supine position, where the area of  both groins was prepped and draped in the usual sterile fashion.  The  patient had a diminished left femoral pulse.  This was identified with the  Doppler.  Using local anesthesia and a single wall puncture, the right  common femoral artery was entered and a guidewire was passed up to the iliac  system.  A Wholey wire would not pass the mid-iliac area.  A 5 French sheath  was passed over the wire and a hand injection was undertaken.  This revealed  subtotal occlusion of the common iliac artery on the left.  The [facility] wire  was removed and an angled Glidewire was used to manipulate through the  severe stenosis and into the suprarenal aorta.  The patient was given 4000  units of intravenous heparin.  The pigtail catheter was then passed over the  guidewire and the guidewire was removed.  AP projection was undertaken, and  this revealed irregular aortoiliac segments bilaterally with no evidence of  flow-limiting stenosis in the aorta.  Renal arteries  were single and widely  patent bilaterally.  The pigtail catheter was then pulled down to the area  above the aortic bifurcation, and again AP projection was undertaken.  Again  this revealed moderate stenosis of the right iliac system.  There was a very  large lumbar collateral on the right that filled the right hypogastric  artery, and this gave collaterals to the left iliac system due to the  subtotal occlusion of the left common iliac artery.  Runoff was then  obtained with bolus chase technique.  The right leg had a patent superficial  femoral artery throughout its course with minimal irregularity.  The  popliteal artery was widely patent, and there was normal three-vessel runoff  on the right.  A decision was made to proceed with angioplasty and stenting  of the common femoral artery lesion on  the left.  The 5 French sheath was  exchanged for a long 6 [place] sheath.  The 6 French sheath was passed to the  level of stenosis.  Next a Genesis 7 x 18 mm stent was positioned at the  level of the focal stenosis.  This was dilated to 10 atmospheres.  Repeat  injection revealed no persistent stenosis through this area with excellent  results.  Runoff was then obtained through the left femoral sheath on the  left.  This again revealed a widely patent superficial femoral artery on the  left with some irregularity in the proximal thigh.  The popliteal artery was  widely patent and again there was three-vessel runoff on the left.  The  patient tolerated the procedure without immediate complication, the sheath  was pulled, and the patient was planned to be discharged later on the  afternoon of the procedure.   FINDINGS:  1. Moderate irregularity in the aortoiliac segments bilaterally.  2. 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  3. Patent superficial femoral artery, popliteal artery, and three-vessel     runoff bilaterally.  4. Successful Palmaz  Genesis balloon angioplasty and stenting of left common     iliac artery stenosis.                                               Larina Earthly, M.D.    TFE/MEDQ  D:  07/25/2003  T:  07/25/2003  Job:  [id number]

## 2010-08-08 NOTE — Procedures (Signed)
[place]. [facility]  Patient:    Ronald Bell, Ronald Bell                    MRN: [id number] Proc. Date: 12/05/99 Adm. Date:  [id number] Attending:  Sabino Gasser CC:         Dr. _______________   Procedure Report  PROCEDURE:  Upper endoscopy with biopsy.  INDICATIONS:  Hemoccult positive stools.  ANESTHESIA:  Versed 1 mg.  PROCEDURE:  With patient mildly sedated in the left lateral decubitus position, the Olympus videoscopic endoscope was inserted in the mouth and passed under direct vision through the esophagus.  Distal esophagus was approached and showed areas of Barretts esophagus with short segments of islands of reddish tissue proximal to the gastroesophageal junction.  These were photographed at first.  We then entered into the stomach.  Fundus and body appeared normal and were photographed.  The antrum, however had a patch of what appeared to be intestinal metaplasia type tissue seen just adjacent to the pylorus.  This was photographed and then biopsied.  We entered into the duodenal bulb and diffuse duodenitis was noted as manifested by erythematous, edematous folds in the duodenal bulb.  These were photographed.  We advanced to the second portion of duodenum, which appeared normal and was photographed. From this point, the endoscope was slowly withdrawn, taking circumferential views of the entire duodenal mucosa until the endoscope had been pulled back into the stomach, placed on retroflexion to view the stomach from below, which appeared normal.  The endoscope was then straightened and pulled back from distal to proximal stomach taking circumferential views of the entire gastric and subsequently esophageal mucosa, which otherwise appeared normal.  Biopsies were taken off the distal esophagus to rule out Barretts.  Patients vital signs and pulse oximeter remained stable.  The patient tolerated the procedure well without apparent  complications.  FINDINGS:  Changes of probable Barretts esophagus with short segments of Barretts seen, photographed and biopsied.  Duodenitis with what appeared to be intestinal metaplasia possibly consistent with H. pylori.  Will await biopsy report and treat according to results.  Will have patient follow up with me as an outpatient. DD:  12/05/99 TD:  12/06/99 Job: [id number]

## 2010-08-15 ENCOUNTER — Other Ambulatory Visit (INDEPENDENT_AMBULATORY_CARE_PROVIDER_SITE_OTHER): Payer: BC Managed Care – PPO

## 2010-08-15 ENCOUNTER — Other Ambulatory Visit: Payer: Self-pay

## 2010-08-15 DIAGNOSIS — Z1211 Encounter for screening for malignant neoplasm of colon: Secondary | ICD-10-CM

## 2010-08-15 DIAGNOSIS — I10 Essential (primary) hypertension: Secondary | ICD-10-CM

## 2010-08-15 LAB — HEMOCCULT GUIAC POC 1CARD (OFFICE)
Card #2 Fecal Occult Blod, POC: POSITIVE
Card #3 Fecal Occult Blood, POC: POSITIVE
Fecal Occult Blood, POC: POSITIVE

## 2010-08-15 MED ORDER — LISINOPRIL-HYDROCHLOROTHIAZIDE 20-12.5 MG PO TABS: 1.0000 | ORAL_TABLET | Freq: Every day | ORAL | Status: DC

## 2010-08-19 ENCOUNTER — Encounter: Payer: BC Managed Care – PPO | Admitting: Vascular Surgery

## 2010-08-20 ENCOUNTER — Telehealth: Payer: Self-pay | Admitting: *Deleted

## 2010-08-20 NOTE — Telephone Encounter (Signed)
Discuss with patient  

## 2010-08-20 NOTE — Telephone Encounter (Addendum)
Left message to call office   Message copied by [facility] on Wed Aug 20, 2010  9:22 AM ------      Message from: Lelon Perla      Created: Tue Aug 19, 2010 10:08 AM       Need f/u GI.---  Heme + stool and anemia

## 2010-08-25 ENCOUNTER — Telehealth: Payer: Self-pay | Admitting: Internal Medicine

## 2010-08-25 DIAGNOSIS — K921 Melena: Secondary | ICD-10-CM

## 2010-08-25 DIAGNOSIS — D509 Iron deficiency anemia, unspecified: Secondary | ICD-10-CM

## 2010-08-25 DIAGNOSIS — D649 Anemia, unspecified: Secondary | ICD-10-CM

## 2010-08-25 DIAGNOSIS — E538 Deficiency of other specified B group vitamins: Secondary | ICD-10-CM

## 2010-08-25 NOTE — Telephone Encounter (Signed)
Pt states that he went to see his medical doctor 2 weeks ago and he had blood in his stool again. Pt states he had a colon in Feb. Pt wants to know what he needs to do know. Please advise.

## 2010-08-26 ENCOUNTER — Encounter (INDEPENDENT_AMBULATORY_CARE_PROVIDER_SITE_OTHER): Payer: BC Managed Care – PPO | Admitting: Vascular Surgery

## 2010-08-26 DIAGNOSIS — I771 Stricture of artery: Secondary | ICD-10-CM

## 2010-08-26 NOTE — Telephone Encounter (Signed)
Pt aware of Dr. Lamar Sprinkles recommendations. Pt scheduled to see Dr. Marina Goodell 7/16@10am . Pt states he will come this Friday 08/29/10 for labs. He will also go to the lab prior to the 7/16 appt. Pt verbalized understanding.

## 2010-08-26 NOTE — Telephone Encounter (Signed)
1. Make sure that he is on iron bid 2. Check ferritin, B12, and flote level now 3. Office visit with me in 6 weeks. Repeat cbc just prior

## 2010-08-27 NOTE — Consult Note (Signed)
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  He is well-known to me from a prior left common iliac artery angioplasty and stenting in 2005.  Fortunately, he has had a very good durable result this procedure and continues to have no recurrent claudication.  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  His most recent  carotid follow up on 07/28/2010 suggested left subclavian occlusion versus severe stenosis. His duplex criteria for his internal carotid arteries was predicted 40%- 59% stenoses bilaterally.  He specifically denies any amaurosis fugax, transient attack or stroke.  He does not have any dizziness.  He does report some fatigue in his left arm with a great deal of use but also has what sounds like either cervical disk disease or arthritis in his shoulder causing some numbness as well.  He has undergone prior cardiac catheterization but does not have any history of heart failure or myocardial infarction. He has hypertension, elevated cholesterol and COPD.  SOCIAL HISTORY:  He is married with 4 children.  He does smoke 1-1/2 packs of cigarettes per day and does have 2-3 cans of beer per day.  FAMILY HISTORY:  Positive for atherosclerosis in a daughter.  REVIEW OF SYSTEMS:  No weight loss or gain, weighs 137 pounds.  He is 5 feet 3 inches tall. CARDIAC:  Shortness of breath with exertion. GI:  Black stools secondary to iron. MUSCULOSKELETAL:  Arthritis. Review of systems otherwise negative.  PHYSICAL EXAMINATION:  A well-developed, well-nourished white male appearing stated age in no acute stress.  Blood pressure is 162/56 right arm, 73/43 left arm, heart rate is 84, respirations 18, oxygen saturation is 95% in room air.  HEENT:  Normal.  He has a  harsh right and soft left carotid bruit.  I do not palpate a radial or ulnar pulse on the right.  He has 2-3+ radial pulse on the right and absent on the left.  Heart is regular rate and rhythm.  He does have palpable posterior tibial pulses bilaterally.  Abdomen:  Soft, nontender.  No masses noted.  Musculoskeletal:  No major deformity or cyanosis. Neurologic:  No focal weakness or paresthesias.  Skin:  Without ulcers or rashes.  I did have his duplex for evaluation and discussed the significance of his carotid occlusive disease with him.  We did do a carotid Duplex ourselves back in 2007.  This showed no differential in arm pressures. The mid did show occlusive signal in the vertebral artery on the left. I discussed the significance of all this with the patient.  I  have explained that we would recommend every 6 month carotid duplex as being done in the [place] office to rule out any progressive occlusion in his carotid arteries. I also explained the significance of his second subclavian occlusive disease . Since he does not have any significance of symptoms with this I would not recommend any treatment.  He understands and will notify us should he develop any new difficulties.    Larina Earthly, M.D. Electronically Signed  TFE/MEDQ  D:  08/26/2010  T:  08/27/2010  Job:  [id number]  cc:   Lelon Perla, DO

## 2010-08-29 ENCOUNTER — Telehealth: Payer: Self-pay

## 2010-08-29 ENCOUNTER — Other Ambulatory Visit (INDEPENDENT_AMBULATORY_CARE_PROVIDER_SITE_OTHER): Payer: BC Managed Care – PPO

## 2010-08-29 DIAGNOSIS — K921 Melena: Secondary | ICD-10-CM

## 2010-08-29 DIAGNOSIS — D509 Iron deficiency anemia, unspecified: Secondary | ICD-10-CM

## 2010-08-29 DIAGNOSIS — D649 Anemia, unspecified: Secondary | ICD-10-CM

## 2010-08-29 DIAGNOSIS — E538 Deficiency of other specified B group vitamins: Secondary | ICD-10-CM

## 2010-08-29 LAB — CBC WITH DIFFERENTIAL/PLATELET
Basophils Absolute: 0.1 10*3/uL (ref 0.0–0.1)
Eosinophils Absolute: 0.3 10*3/uL (ref 0.0–0.7)
HCT: 37 % — ABNORMAL LOW (ref 39.0–52.0)
Hemoglobin: 12.5 g/dL — ABNORMAL LOW (ref 13.0–17.0)
Lymphs Abs: 1.1 10*3/uL (ref 0.7–4.0)
MCHC: 33.8 g/dL (ref 30.0–36.0)
Monocytes Relative: 11.2 % (ref 3.0–12.0)
Neutro Abs: 6 10*3/uL (ref 1.4–7.7)
RDW: 13.3 % (ref 11.5–14.6)

## 2010-08-29 NOTE — Telephone Encounter (Signed)
Message copied by Michele Mcalpine on Fri Aug 29, 2010  2:38 PM ------      Message from: Hilarie Fredrickson      Created: Fri Aug 29, 2010  2:11 PM       Let pt know that labs are ok. Keep follow up for 6 weeks with cbc just prior

## 2010-08-29 NOTE — Telephone Encounter (Signed)
Pt aware of results per Dr. Marina Goodell. He knows to keep scheduled appt.

## 2010-10-01 ENCOUNTER — Encounter: Payer: Self-pay | Admitting: *Deleted

## 2010-10-06 ENCOUNTER — Ambulatory Visit (INDEPENDENT_AMBULATORY_CARE_PROVIDER_SITE_OTHER): Payer: BC Managed Care – PPO | Admitting: Internal Medicine

## 2010-10-06 ENCOUNTER — Encounter: Payer: Self-pay | Admitting: Internal Medicine

## 2010-10-06 ENCOUNTER — Telehealth: Payer: Self-pay

## 2010-10-06 ENCOUNTER — Other Ambulatory Visit (INDEPENDENT_AMBULATORY_CARE_PROVIDER_SITE_OTHER): Payer: BC Managed Care – PPO

## 2010-10-06 DIAGNOSIS — D649 Anemia, unspecified: Secondary | ICD-10-CM

## 2010-10-06 DIAGNOSIS — R195 Other fecal abnormalities: Secondary | ICD-10-CM

## 2010-10-06 DIAGNOSIS — K219 Gastro-esophageal reflux disease without esophagitis: Secondary | ICD-10-CM

## 2010-10-06 DIAGNOSIS — Z86010 Personal history of colonic polyps: Secondary | ICD-10-CM

## 2010-10-06 DIAGNOSIS — K5521 Angiodysplasia of colon with hemorrhage: Secondary | ICD-10-CM

## 2010-10-06 LAB — CBC WITH DIFFERENTIAL/PLATELET
Basophils Absolute: 0.1 10*3/uL (ref 0.0–0.1)
Eosinophils Absolute: 0.2 10*3/uL (ref 0.0–0.7)
HCT: 39.5 % (ref 39.0–52.0)
Lymphs Abs: 1.1 10*3/uL (ref 0.7–4.0)
Monocytes Relative: 9.3 % (ref 3.0–12.0)
Platelets: 282 10*3/uL (ref 150.0–400.0)
RDW: 13.1 % (ref 11.5–14.6)

## 2010-10-06 NOTE — Patient Instructions (Signed)
Follow up as needed

## 2010-10-06 NOTE — Telephone Encounter (Signed)
Message copied by Michele Mcalpine on Mon Oct 06, 2010  3:28 PM ------      Message from: Hilarie Fredrickson      Created: Mon Oct 06, 2010  1:54 PM       Please let the patient know that his blood counts are now normal. He should continue on iron twice daily indefinitely

## 2010-10-06 NOTE — Progress Notes (Signed)
HISTORY OF PRESENT ILLNESS:  Ronald Bell is a 70 y.o. 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 Also history of adenomatous colon polyps. 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 Most recent colonoscopy and upper endoscopy in February 2012. Colonoscopy revealed nonbleeding cecal AVMs as well as a right colon polyp which was removed and found to be adenomatous. Because this was sessile and removed piecemeal, followup in one year recommended. His upper endoscopy was normal. He has had no GI complaints. He denies rectal bleeding or melena. Stools are chronically dark due to iron. In May and underwent routine evaluation it was found to be anemic with a hemoglobin of 11.9. Elevated MCV at 104.5. Stool Hemoccult returned positive. He is referred for Hemoccult positive stool and anemia. I recommended ferritin, folate, and B12 levels. These were normal. We increased his iron therapy to twice a day. Repeat hemoglobin on 08/29/2010 is 12.5.  REVIEW OF SYSTEMS:  All non-GI ROS negative.  Past Medical History  Diagnosis Date  . Other symptoms involving cardiovascular system   . Polyp of nasal cavity   . Other specified disorders of arteries and arterioles   . Cervicalgia   . Routine general medical examination at a health care facility   . Rosacea   . Benign neoplasm of colon   . Personal history of tobacco use, presenting hazards to health   . Personal history of other diseases of digestive system   . Psychosexual dysfunction with inhibited sexual excitement   . Chronic airway obstruction, not elsewhere classified   . Unspecified essential hypertension   . Other and unspecified hyperlipidemia   . GERD (gastroesophageal reflux disease)   . Anemia   . Barrett esophagus     Past Surgical History  Procedure Date  . Colon surgery     . Iliac artery stent     Social History Ronald Bell  reports that he has been smoking.  He does not have any smokeless tobacco history on file. He reports that he drinks alcohol. He reports that he does not use illicit drugs.  family history includes Colitis in his father; Lung disease in his mother; and Uterine cancer in an unspecified family member.  There is no history of Colon cancer.  No Known Allergies     PHYSICAL EXAMINATION: Vital signs: BP 122/60  Pulse 64  Ht 5\' 3"  (1.6 m)  Wt 132 lb (59.875 kg)  BMI 23.38 kg/m2 General: Well-developed, well-nourished, no acute distress HEENT: Sclerae are anicteric, conjunctiva pink. Oral mucosa intact. Bilateral cataracts Lungs: Clear Heart: Regular Abdomen: soft, nontender, nondistended, no obvious ascites, no peritoneal signs, normal bowel sounds. No organomegaly. Extremities: No edema Psychiatric: alert and oriented x3. Cooperative    ASSESSMENT:  #1. Hemoccult-positive stool. Most likely due to known AVMs #2. Macrocytic anemia. No evidence for iron deficiency, or B12 or folate deficiencies. #3. 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 History of adenomatous colon polyps. Last colonoscopy February 2012 #5. GERD without Barrett's.   PLAN:  #1. Continue iron twice a day #2. Followup CBC today. If still anemic with elevated MCV, would consider hematology opinion #3. Continue PPI for GERD #4. Keep plans for colonoscopy February 2013

## 2010-10-06 NOTE — Telephone Encounter (Signed)
Pt aware of results and Dr. Perry's recommendations. 

## 2010-10-20 ENCOUNTER — Other Ambulatory Visit: Payer: Self-pay | Admitting: Family Medicine

## 2010-10-20 MED ORDER — VARENICLINE TARTRATE 1 MG PO TABS: 1.0000 mg | ORAL_TABLET | ORAL | Status: DC

## 2010-10-20 NOTE — Telephone Encounter (Signed)
Last OV-07/25/10 Last filled- 05/31/09 #1, 3 rfs.

## 2010-11-17 ENCOUNTER — Telehealth: Payer: Self-pay | Admitting: Family Medicine

## 2010-11-17 ENCOUNTER — Other Ambulatory Visit: Payer: Self-pay | Admitting: Family Medicine

## 2010-11-17 MED ORDER — VARENICLINE TARTRATE 1 MG PO TABS: 1.0000 mg | ORAL_TABLET | ORAL | Status: DC

## 2010-11-17 NOTE — Telephone Encounter (Signed)
Just send cont pack of chantix 1 pack 2 refills  As directed

## 2010-11-17 NOTE — Telephone Encounter (Signed)
Faxed.   KP 

## 2010-11-17 NOTE — Telephone Encounter (Signed)
Please advise on directions of second step of Chantix      KP

## 2010-11-17 NOTE — Telephone Encounter (Signed)
Duplicate request.    KP 

## 2010-12-12 ENCOUNTER — Other Ambulatory Visit: Payer: Self-pay | Admitting: Family Medicine

## 2010-12-12 MED ORDER — VARENICLINE TARTRATE 1 MG PO TABS: 1.0000 mg | ORAL_TABLET | ORAL | Status: DC

## 2010-12-12 NOTE — Telephone Encounter (Signed)
Last seen 07/25/10 and filled 11/17/10 please advise     KP

## 2011-01-05 ENCOUNTER — Other Ambulatory Visit: Payer: Self-pay | Admitting: Family Medicine

## 2011-01-05 MED ORDER — PANTOPRAZOLE SODIUM 40 MG PO TBEC: 40.0000 mg | DELAYED_RELEASE_TABLET | Freq: Every day | ORAL | Status: DC

## 2011-01-05 NOTE — Telephone Encounter (Signed)
protonix 40 mg #30  1 po qd ---11 refills 

## 2011-01-05 NOTE — Telephone Encounter (Signed)
Discuss with patient, Rx sent. 

## 2011-01-05 NOTE — Telephone Encounter (Signed)
Patient takes prescription  otc  prilosec he wants to know if he can take  pantorprazole 40  --[facility] - [address] - which would be less expensive

## 2011-01-14 ENCOUNTER — Other Ambulatory Visit: Payer: Self-pay | Admitting: Family Medicine

## 2011-01-14 MED ORDER — VARENICLINE TARTRATE 1 MG PO TABS: 1.0000 mg | ORAL_TABLET | ORAL | Status: DC

## 2011-01-14 NOTE — Telephone Encounter (Signed)
Faxed     Kp 

## 2011-01-23 ENCOUNTER — Encounter: Payer: Self-pay | Admitting: Family Medicine

## 2011-01-23 ENCOUNTER — Ambulatory Visit (INDEPENDENT_AMBULATORY_CARE_PROVIDER_SITE_OTHER): Payer: BC Managed Care – PPO | Admitting: Family Medicine

## 2011-01-23 DIAGNOSIS — Z125 Encounter for screening for malignant neoplasm of prostate: Secondary | ICD-10-CM

## 2011-01-23 DIAGNOSIS — I10 Essential (primary) hypertension: Secondary | ICD-10-CM

## 2011-01-23 DIAGNOSIS — I771 Stricture of artery: Secondary | ICD-10-CM

## 2011-01-23 DIAGNOSIS — Z0000 Encounter for general adult medical examination without abnormal findings: Secondary | ICD-10-CM

## 2011-01-23 DIAGNOSIS — F17200 Nicotine dependence, unspecified, uncomplicated: Secondary | ICD-10-CM

## 2011-01-23 DIAGNOSIS — K219 Gastro-esophageal reflux disease without esophagitis: Secondary | ICD-10-CM

## 2011-01-23 DIAGNOSIS — Z23 Encounter for immunization: Secondary | ICD-10-CM

## 2011-01-23 DIAGNOSIS — J449 Chronic obstructive pulmonary disease, unspecified: Secondary | ICD-10-CM

## 2011-01-23 DIAGNOSIS — E785 Hyperlipidemia, unspecified: Secondary | ICD-10-CM

## 2011-01-23 DIAGNOSIS — Z720 Tobacco use: Secondary | ICD-10-CM

## 2011-01-23 DIAGNOSIS — N529 Male erectile dysfunction, unspecified: Secondary | ICD-10-CM

## 2011-01-23 DIAGNOSIS — I6529 Occlusion and stenosis of unspecified carotid artery: Secondary | ICD-10-CM

## 2011-01-23 LAB — HEPATIC FUNCTION PANEL
ALT: 52 U/L (ref 0–53)
AST: 33 U/L (ref 0–37)
Albumin: 4.4 g/dL (ref 3.5–5.2)
Bilirubin, Direct: 0.1 mg/dL (ref 0.0–0.3)
Total Bilirubin: 0.4 mg/dL (ref 0.3–1.2)

## 2011-01-23 LAB — CBC WITH DIFFERENTIAL/PLATELET
Basophils Absolute: 0.1 10*3/uL (ref 0.0–0.1)
Eosinophils Relative: 5.2 % — ABNORMAL HIGH (ref 0.0–5.0)
Monocytes Relative: 12.4 % — ABNORMAL HIGH (ref 3.0–12.0)
Neutrophils Relative %: 67.5 % (ref 43.0–77.0)
Platelets: 306 10*3/uL (ref 150.0–400.0)
WBC: 8.4 10*3/uL (ref 4.5–10.5)

## 2011-01-23 LAB — BASIC METABOLIC PANEL
BUN: 16 mg/dL (ref 6–23)
Calcium: 9.6 mg/dL (ref 8.4–10.5)
Creatinine, Ser: 0.7 mg/dL (ref 0.4–1.5)
GFR: 128.18 mL/min (ref >60.00)

## 2011-01-23 LAB — LIPID PANEL
Cholesterol: 146 mg/dL (ref 0–200)
HDL: 54.4 mg/dL (ref >39.00)

## 2011-01-23 LAB — PSA: PSA: 0.97 ng/mL (ref 0.10–4.00)

## 2011-01-23 MED ORDER — PANTOPRAZOLE SODIUM 40 MG PO TBEC: 40.0000 mg | DELAYED_RELEASE_TABLET | Freq: Every day | ORAL | Status: DC

## 2011-01-23 MED ORDER — VARENICLINE TARTRATE 1 MG PO TABS: 1.0000 mg | ORAL_TABLET | ORAL | Status: DC

## 2011-01-23 MED ORDER — AMLODIPINE BESYLATE 10 MG PO TABS: 10.0000 mg | ORAL_TABLET | Freq: Every day | ORAL | Status: DC

## 2011-01-23 MED ORDER — SILDENAFIL CITRATE 100 MG PO TABS: 100.0000 mg | ORAL_TABLET | Freq: Every day | ORAL | Status: DC | PRN

## 2011-01-23 MED ORDER — ZOSTER VACCINE LIVE 19400 UNT/0.65ML ~~LOC~~ SOLR: 0.6500 mL | Freq: Once | SUBCUTANEOUS | Status: AC

## 2011-01-23 MED ORDER — FLUTICASONE-SALMETEROL 250-50 MCG/DOSE IN AEPB: 1.0000 | INHALATION_SPRAY | Freq: Two times a day (BID) | RESPIRATORY_TRACT | Status: DC

## 2011-01-23 MED ORDER — ATORVASTATIN CALCIUM 40 MG PO TABS: 40.0000 mg | ORAL_TABLET | Freq: Every day | ORAL | Status: DC

## 2011-01-23 MED ORDER — LISINOPRIL-HYDROCHLOROTHIAZIDE 20-12.5 MG PO TABS: 1.0000 | ORAL_TABLET | Freq: Every day | ORAL | Status: DC

## 2011-01-23 NOTE — Progress Notes (Signed)
Addended by: Lelon Perla on: 01/23/2011 02:11 PM   Modules accepted: Orders

## 2011-01-23 NOTE — Progress Notes (Signed)
Subjective:    Ronald Bell is a 70 y.o. male who presents for Medicare Annual/Subsequent preventive examination.   Preventive Screening-Counseling & Management  Tobacco History  Smoking status  . Current Everyday Smoker  Smokeless tobacco  . Not on file    Problems Prior to Visit 1.   Current Problems (verified) Patient Active Problem List  Diagnoses  . Benign Neoplasm of Colon  . HYPERLIPIDEMIA  . ANEMIA, SECONDARY TO BLOOD LOSS  . ANEMIA-IRON DEFICIENCY  . UNSPECIFIED ANEMIA  . OTHER SPECIFIED DISEASE OF WHITE BLOOD CELLS  . ERECTILE DYSFUNCTION  . HYPERTENSION  . OTHER SPECIFIED DISORDERS OF ARTERIES&ARTERIOLES  . POLYP OF NASAL CAVITY  . COPD  . GERD  . ANGIODYSPLASIA-DUODENUM-STOMACH  . ANGIODYSPLASIA-INTESTINE  . HEMOCCULT POSITIVE STOOL  . GI BLEEDING  . ROSACEA  . NECK PAIN, LEFT  . CAROTID BRUITS, BILATERAL  . PERSONAL HX COLONIC POLYPS  . BARRETT'S ESOPHAGUS, HX OF  . TOBACCO ABUSE, HX OF    Medications Prior to Visit Current Outpatient Prescriptions on File Prior to Visit  Medication Sig Dispense Refill  . amLODipine (NORVASC) 10 MG tablet Take 1 tablet (10 mg total) by mouth daily.  90 tablet  3  . Ascorbic Acid (VITAMIN C) 250 MG tablet Take 500 mg by mouth daily.       Marland Kitchen aspirin 81 MG EC tablet Take 81 mg by mouth daily.        Marland Kitchen atorvastatin (LIPITOR) 40 MG tablet Take 1 tablet (40 mg total) by mouth daily.  90 tablet  3  . Ferrous Sulfate (IRON) 325 (65 FE) MG TABS Take 1 tablet by mouth 2 (two) times daily.  90 each  3  . Fluticasone-Salmeterol (ADVAIR DISKUS) 250-50 MCG/DOSE AEPB Inhale 1 puff into the lungs 2 (two) times daily.  60 each  5  . lisinopril-hydrochlorothiazide (PRINZIDE,ZESTORETIC) 20-12.5 MG per tablet Take 1 tablet by mouth daily.  30 tablet  3  . Multiple Vitamins-Minerals (CENTRUM SILVER PO) Take by mouth daily.        . pantoprazole (PROTONIX) 40 MG tablet Take 1 tablet (40 mg total) by mouth daily.  30 tablet  11    . sildenafil (VIAGRA) 100 MG tablet Take 100 mg by mouth daily as needed.        . varenicline (CHANTIX CONTINUING MONTH PAK) 1 MG tablet Take 1 tablet (1 mg total) by mouth as directed.  30 tablet  0    Current Medications (verified) Current Outpatient Prescriptions  Medication Sig Dispense Refill  . amLODipine (NORVASC) 10 MG tablet Take 1 tablet (10 mg total) by mouth daily.  90 tablet  3  . Ascorbic Acid (VITAMIN C) 250 MG tablet Take 500 mg by mouth daily.       Marland Kitchen aspirin 81 MG EC tablet Take 81 mg by mouth daily.        Marland Kitchen atorvastatin (LIPITOR) 40 MG tablet Take 1 tablet (40 mg total) by mouth daily.  90 tablet  3  . Ferrous Sulfate (IRON) 325 (65 FE) MG TABS Take 1 tablet by mouth 2 (two) times daily.  90 each  3  . Fluticasone-Salmeterol (ADVAIR DISKUS) 250-50 MCG/DOSE AEPB Inhale 1 puff into the lungs 2 (two) times daily.  60 each  5  . lisinopril-hydrochlorothiazide (PRINZIDE,ZESTORETIC) 20-12.5 MG per tablet Take 1 tablet by mouth daily.  30 tablet  3  . Multiple Vitamins-Minerals (CENTRUM SILVER PO) Take by mouth daily.        Marland Kitchen  pantoprazole (PROTONIX) 40 MG tablet Take 1 tablet (40 mg total) by mouth daily.  30 tablet  11  . sildenafil (VIAGRA) 100 MG tablet Take 100 mg by mouth daily as needed.        . varenicline (CHANTIX CONTINUING MONTH PAK) 1 MG tablet Take 1 tablet (1 mg total) by mouth as directed.  30 tablet  0     Allergies (verified) Review of patient's allergies indicates no known allergies.   PAST HISTORY  Family History Family History  Problem Relation Age of Onset  . Lung disease Mother   . Uterine cancer    . Colitis Father   . Colon cancer Neg Hx     Social History History  Substance Use Topics  . Smoking status: Current Everyday Smoker  . Smokeless tobacco: Not on file  . Alcohol Use: Yes    Are there smokers in your home (other than you)?  Yes  Risk Factors Current exercise habits: walks alot at work and yard work  Dietary issues  discussed: NA   Cardiac risk factors: advanced age (older than 47 for men, 61 for women), dyslipidemia, hypertension, male gender, sedentary lifestyle and smoking/ tobacco exposure.  Depression Screen (Note: if answer to either of the following is "Yes", a more complete depression screening is indicated)   Q1: Over the past two weeks, have you felt down, depressed or hopeless? No  Q2: Over the past two weeks, have you felt little interest or pleasure in doing things? No  Have you lost interest or pleasure in daily life? No  Do you often feel hopeless? No  Do you cry easily over simple problems? No  Activities of Daily Living In your present state of health, do you have any difficulty performing the following activities?:  Driving? No Managing money?  No Feeding yourself? No Getting from bed to chair? No Climbing a flight of stairs? No Preparing food and eating?: No Bathing or showering? No Getting dressed: No Getting to the toilet? No Using the toilet:No Moving around from place to place: No In the past year have you fallen or had a near fall?:No   Are you sexually active?  Yes  Do you have more than one partner?  No  Hearing Difficulties: No Do you often ask people to speak up or repeat themselves? No Do you experience ringing or noises in your ears? No Do you have difficulty understanding soft or whispered voices? No   Do you feel that you have a problem with memory? No  Do you often misplace items? No  Do you feel safe at home?  No  Cognitive Testing  Alert? Yes  Normal Appearance?Yes  Oriented to person? Yes  Place? Yes   Time? Yes  Recall of three objects?  Yes  Can perform simple calculations? Yes  Displays appropriate judgment?Yes  Can read the correct time from a watch face?Yes   Advanced Directives have been discussed with the patient? Yes   List the Names of Other Physician/Practitioners you currently use: 1.  Cardio--[facility] 2. Vascular--[facility] 3  GI--[facility] 4. opth--[facility]  Indicate any recent Medical Services you may have received from other than [facility] providers in the past year (date may be approximate).  Immunization History  Administered Date(s) Administered  . Influenza Whole 01/28/2007, 12/14/2007, 12/14/2008, 01/17/2010  . Pneumococcal Polysaccharide 12/18/2003, 11/30/2008  . Td 03/23/2002    Screening Tests Health Maintenance  Topic Date Due  . Zostavax  08/15/1998  . Influenza Vaccine  12/22/2010  . Tetanus/tdap  03/23/2012  . Colonoscopy  05/04/2019  . Pneumococcal Polysaccharide Vaccine Age 74 And Over  Completed    All answers were reviewed with the patient and necessary referrals were made:  Loreen Freud, DO   01/23/2011   History reviewed: allergies, current medications, past family history, past medical history, past social history, past surgical history and problem list  Review of Systems  Review of Systems  Constitutional: Negative for activity change, appetite change and fatigue.  HENT: Negative for hearing loss, congestion, tinnitus and ear discharge.  dentist--dentures Eyes: Negative for visual disturbance (see optho q2y -- vision corrected to 20/20 with glasses).  Respiratory: Negative for cough, chest tightness and shortness of breath.   Cardiovascular: Negative for chest pain, palpitations and leg swelling.  Gastrointestinal: Negative for abdominal pain, diarrhea, constipation and abdominal distention.  Genitourinary: Negative for urgency, frequency, decreased urine volume and difficulty urinating.  Musculoskeletal: Negative for back pain, arthralgias and gait problem.  Skin: Negative for color change, pallor and rash.  Neurological: Negative for dizziness, light-headedness, numbness and headaches.  Hematological: Negative for adenopathy. Does not bruise/bleed easily.  Psychiatric/Behavioral: Negative for suicidal ideas, confusion, sleep disturbance, self-injury, dysphoric mood, decreased  concentration and agitation.  Pt is able to read and write and can do all ADLs No risk for falling No abuse/ violence in home     Objective:     Vision by Snellen chart: opth Blood pressure 136/72, pulse 102, temperature 97.8 F (36.6 C), temperature source Oral, height 5\' 3"  (1.6 m), weight 140 lb 3.2 oz (63.594 kg), SpO2 95.00%. Body mass index is 24.84 kg/(m^2).  BP 136/72  Pulse 102  Temp(Src) 97.8 F (36.6 C) (Oral)  Ht 5\' 3"  (1.6 m)  Wt 140 lb 3.2 oz (63.594 kg)  BMI 24.84 kg/m2  SpO2 95% General appearance: alert, cooperative, appears stated age and no distress Head: Normocephalic, without obvious abnormality, atraumatic Eyes: negative findings: lids and lashes normal, conjunctivae and sclerae normal and pupils equal, round, reactive to light and accomodation Ears: normal TM's and external ear canals both ears Nose: Nares normal. Septum midline. Mucosa normal. No drainage or sinus tenderness. Throat: lips, mucosa, and tongue normal; teeth and gums normal Neck: no adenopathy, no JVD, supple, symmetrical, trachea midline, thyroid not enlarged, symmetric, no tenderness/mass/nodules and + b/l carotid bruit Back: symmetric, no curvature. ROM normal. No CVA tenderness. Lungs: clear to auscultation bilaterally Chest wall: no tenderness Heart: S1, S2 normal and murmur Abdomen: soft, non-tender; bowel sounds normal; no masses,  no organomegaly Male genitalia: penis: no lesions or discharge. testes: no masses or tenderness. no hernias----heme + brown stool but pt is on iron Rectal: prostate enlarged, smooth, nontender Extremities: extremities normal, atraumatic, no cyanosis or edema Pulses: 2+ and symmetric Skin: Skin color, texture, turgor normal. No rashes or lesions Lymph nodes: Cervical, supraclavicular, and axillary nodes normal. Neurologic: Alert and oriented X 3, normal strength and tone. Normal symmetric reflexes. Normal coordination and gait Psych--no anxiety, no  depression     Assessment:    CPE  Hyperlipidemia-- stable HTN stabls Carotid stenosis---per cardio and cvts--carotid dopplers q71m Subclavian stenosis-as above tob abuse---on chantix --pt aware he is to stop smoking ---he has been on chantix 2 months---down to 1 pack from 2   Plan:     During the course of the visit the patient was educated and counseled about appropriate screening and preventive services including:    Pneumococcal vaccine   Influenza vaccine  Td vaccine  Screening electrocardiogram  Prostate cancer screening  Colorectal cancer screening  Nutrition counseling   Smoking cessation counseling  Advanced directives: has an advanced directive - a copy HAS NOT been provided.  Diet review for nutrition referral? Yes ____  Not Indicated __xto__   Patient Instructions (the written plan) was given to the patient.  Medicare Attestation I have personally reviewed: The patient's medical and social history Their use of alcohol, tobacco or illicit drugs Their current medications and supplements The patient's functional ability including ADLs,fall risks, home safety risks, cognitive, and hearing and visual impairment Diet and physical activities Evidence for depression or mood disorders  The patient's weight, height, BMI, and visual acuity have been recorded in the chart.  I have made referrals, counseling, and provided education to the patient based on review of the above and I have provided the patient with a written personalized care plan for preventive services.     Loreen Freud, DO   01/23/2011

## 2011-01-23 NOTE — Progress Notes (Signed)
Addended by: Arnette Norris on: 01/23/2011 01:46 PM   Modules accepted: Orders

## 2011-01-23 NOTE — Patient Instructions (Signed)
Preventative Care for Adults, Male A healthy lifestyle and preventative care can promote health and wellness. Preventative health guidelines for men include the following key practices:  A routine yearly physical is a good way to check with your caregiver about your health and preventative screening. It is a chance to share any concerns and updates on your health, and to receive a thorough exam.   Visit your dentist for a routine exam and preventative care every 6 months. Brush your teeth twice a day and floss once a day. Good oral hygiene prevents tooth decay and gum disease.   The frequency of eye exams is based on your age, health, family medical history, use of contact lenses, and other factors. Follow your caregiver's recommendations for frequency of eye exams.   Eat a healthy diet. Foods like vegetables, fruits, whole grains, low-fat dairy products, and lean protein foods contain the nutrients you need without too many calories. Decrease your intake of foods high in solid fats, added sugars, and salt. Eat the right amount of calories for you.Get information about a proper diet from your caregiver, if necessary.   Regular physical exercise is one of the most important things you can do for your health. Most adults should get at least 150 minutes of moderate-intensity exercise (any activity that increases your heart rate and causes you to sweat) each week. In addition, most adults need muscle-strengthening exercises on 2 or more days a week.   Maintain a healthy weight. The body mass index (BMI) is a screening tool to identify possible weight problems. It provides an estimate of body fat based on height and weight. Your caregiver can help determine your BMI, and can help you achieve or maintain a healthy weight.For adults 20 years and older:   A BMI below 18.5 is considered underweight.   A BMI of 18.5 to 24.9 is normal.   A BMI of 25 to 29.9 is considered overweight.   A BMI of 30 and  above is considered obese.   Maintain normal blood lipids and cholesterol levels by exercising and minimizing your intake of saturated fat. Eat a balanced diet with plenty of fruit and vegetables. Blood tests for lipids and cholesterol should begin at age 20 and be repeated every 5 years. If your lipid or cholesterol levels are high, you are over 50, or you are a high risk for heart disease, you may need your cholesterol levels checked more frequently.Ongoing high lipid and cholesterol levels should be treated with medicines if diet and exercise are not effective.   If you smoke, find out from your caregiver how to quit. If you do not use tobacco, do not start.   If you choose to drink alcohol, do not exceed 2 drinks per day. One drink is considered to be 12 ounces (355 mL) of beer, 5 ounces (148 mL) of wine, or 1.5 ounces (44 mL) of liquor.   Avoid use of street drugs. Do not share needles with anyone. Ask for help if you need support or instructions about stopping the use of drugs.   High blood pressure causes heart disease and increases the risk of stroke. Your blood pressure should be checked at least every 1 to 2 years. Ongoing high blood pressure should be treated with medicines, if weight loss and exercise are not effective.   If you are 45 to 72 years old, ask your caregiver if you should take aspirin to prevent heart disease.   Diabetes screening involves taking a blood   sample to check your fasting blood sugar level. This should be done once every 3 years, after age 45, if you are within normal weight and without risk factors for diabetes. Testing should be considered at a younger age or be carried out more frequently if you are overweight and have at least 1 risk factor for diabetes.   Colorectal cancer can be detected and often prevented. Most routine colorectal cancer screening begins at the age of 50 and continues through age 75. However, your caregiver may recommend screening at an  earlier age if you have risk factors for colon cancer. On a yearly basis, your caregiver may provide home test kits to check for hidden blood in the stool. Use of a small camera at the end of a tube, to directly examine the colon (sigmoidoscopy or colonoscopy), can detect the earliest forms of colorectal cancer. Talk to your caregiver about this at age 50, when routine screening begins. Direct examination of the colon should be repeated every 5 to 10 years through age 75, unless early forms of pre-cancerous polyps or small growths are found.   Practice safe sex. Use condoms and avoid high-risk sexual practices to reduce the spread of sexually transmitted infections (STIs). STIs include gonorrhea, chlamydia, syphilis, trichomonas, herpes, HPV, and human immunodeficiency virus (HIV). Herpes, HIV, and HPV are viral illnesses that have no cure. They can result in disability, cancer, and death.   A one-time screening for abdominal aortic aneurysm (AAA) and surgical repair of large AAAs by sound wave imaging (ultrasonography) is recommended for ages 65 to 75 years who are current or former smokers.   Healthy men should no longer receive prostate-specific antigen (PSA) blood tests as part of routine cancer screening. Consult with your caregiver about prostate cancer screening.   Use sunscreen with skin protection factor (SPF) of 30 or more. Apply sunscreen liberally and repeatedly throughout the day. You should seek shade when your shadow is shorter than you. Protect yourself by wearing long sleeves, pants, a wide-brimmed hat, and sunglasses year round, whenever you are outdoors.   Once a month, do a whole body skin exam, using a mirror to look at the skin on your back. Notify your caregiver of new moles, moles that have irregular borders, moles that are larger than a pencil eraser, or moles that have changed in shape or color.   Stay current with required immunizations.   Influenza. You need a dose every  fall (or winter). The composition of the flu vaccine changes each year, so being vaccinated once is not enough.   Pneumococcal polysaccharide. You need 1 to 2 doses if you smoke cigarettes or if you have certain chronic medical conditions. You need 1 dose at age 65 (or older) if you have never been vaccinated.   Tetanus, diphtheria, pertussis (Tdap, Td). Get 1 dose of Tdap vaccine if you are younger than age 65 years, are over 65 and have contact with an infant, are a healthcare worker, or simply want to be protected from whooping cough. After that, you need a Td booster dose every 10 years. Consult your caregiver if you have not had at least 3 tetanus and diphtheria-containing shots sometime in your life or have a deep or dirty wound.   HPV. This vaccine is recommended for males 13 through 72 years of age. This vaccine may be given to men 22 through 72 years of age who have not completed the 3 dose series. It is recommended for men through age 26   who have sex with men or whose immune system is weakened because of HIV infection, other illness, or medications. The vaccine is given in 3 doses over 6 months.   Measles, mumps, rubella (MMR). You need at least 1 dose of MMR if you were born in 1957 or later. You may also need a 2nd dose.   Meningococcal. If you are age 19 to 21 years and a first-year college student living in a residence hall, or have one of several medical conditions, you need to get vaccinated against meningococcal disease. You may also need additional booster doses.   Zoster (shingles). If you are age 60 years or older, you should get this vaccine.   Varicella (chickenpox). If you have never had chickenpox or you were vaccinated but received only 1 dose, talk to your caregiver to find out if you need this vaccine.   Hepatitis A. You need this vaccine if you have a specific risk factor for hepatitis A virus infection, or you simply wish to be protected from this disease. The vaccine is  usually given as 2 doses, 6 to 18 months apart.   Hepatitis B. You need this vaccine if you have a specific risk factor for hepatitis B virus infection or you simply wish to be protected from this disease. The vaccine is given in 3 doses, usually over 6 months.  Preventative Service / Frequency Ages 19 to 39  Blood pressure check.** / Every 1 to 2 years.   Lipid and cholesterol check.**/ Every 5 years beginning at age 20.   Skin self-exam. / Monthly.   Influenza immunization.** / Every year.   Pneumococcal polysaccharide immunization.** / 1 to 2 doses if you smoke cigarettes or if you have certain chronic medical conditions.   Tetanus, diphtheria, pertussis (Tdap,Td) immunization. / A one-time dose of Tdap vaccine. After that, you need a Td booster dose every 10 years.   HPV immunization. / 3 doses over 6 months, if 26 and younger.   Measles, mumps, rubella (MMR) immunization. / You need at least 1 dose of MMR if you were born in 1957 or later. You may also need a 2nd dose.   Meningococcal immunization. / 1 dose if you are age 19 to 21 years and a first-year college student living in a residence hall, or have one of several medical conditions, you need to get vaccinated against meningococcal disease. You may also need additional booster doses.   Varicella immunization. **/ Consult your caregiver.   Hepatitis A immunization. ** / Consult your caregiver. 2 doses, 6 to 18 months apart.   Hepatitis B immunization.** / Consult your caregiver. 3 doses usually over 6 months.  Ages 40 to 64  Blood pressure check.** / Every 1 to 2 years.   Lipid and cholesterol check.**/ Every 5 years beginning at age 20.   Fecal occult blood test (FOBT) of stool. / Every year beginning at age 50 and continuing until age 75. You may not have to do this test if you get colonoscopy every 10 years.   Flexible sigmoidoscopy** or colonoscopy.** / Every 5 years for a flexible sigmoidoscopy or every 10 years for  a colonoscopy beginning at age 50 and continuing until age 75.   Skin self-exam. / Monthly.   Influenza immunization.** / Every year.   Pneumococcal polysaccharide immunization.** / 1 to 2 doses if you smoke cigarettes or if you have certain chronic medical conditions.   Tetanus, diphtheria, pertussis (Tdap/Td) immunization.** / A one-time dose of   Tdap vaccine. After that, you need a Td booster dose every 10 years.   Measles, mumps, rubella (MMR) immunization. / You need at least 1 dose of MMR if you were born in 1957 or later. You may also need a 2nd dose.   Varicella immunization. **/ Consult your caregiver.   Meningococcal immunization.** / Consult your caregiver.   Hepatitis A immunization. ** / Consult your caregiver. 2 doses, 6 to 18 months apart.   Hepatitis B immunization.** / Consult your caregiver. 3 doses, usually over 6 months.  Ages 65 and over  Blood pressure check.** / Every 1 to 2 years.   Lipid and cholesterol check.**/ Every 5 years beginning at age 20.   Fecal occult blood test (FOBT) of stool. / Every year beginning at age 50 and continuing until age 75. You may not have to do this test if you get colonoscopy every 10 years.   Flexible sigmoidoscopy** or colonoscopy.** / Every 5 years for a flexible sigmoidoscopy or every 10 years for a colonoscopy beginning at age 50 and continuing until age 75.   Abdominal aortic aneurysm (AAA) screening.** / A one-time screening for ages 65 to 75 years who are current or former smokers.   Skin self-exam. / Monthly.   Influenza immunization.** / Every year.   Pneumococcal polysaccharide immunization.** / 1 dose at age 65 (or older) if you have never been vaccinated.   Tetanus, diphtheria, pertussis (Tdap, Td) immunization. / A one-time dose of Tdap vaccine if you are over 65 and have contact with an infant, are a healthcare worker, or simply want to be protected from whooping cough. After that, you need a Td booster dose  every 10 years.   Varicella immunization. **/ Consult your caregiver.   Meningococcal immunization.** / Consult your caregiver.   Hepatitis A immunization. ** / Consult your caregiver. 2 doses, 6 to 18 months apart.   Hepatitis B immunization.** / Check with your caregiver. 3 doses, usually over 6 months.  **Family history and personal history of risk and conditions may change your caregiver's recommendations. Document Released: 05/05/2001 Document Revised: 11/19/2010 Document Reviewed: 08/04/2010 [employer] Patient Information 2012 [employer]. 

## 2011-02-09 ENCOUNTER — Other Ambulatory Visit: Payer: Self-pay | Admitting: Family Medicine

## 2011-02-09 DIAGNOSIS — E785 Hyperlipidemia, unspecified: Secondary | ICD-10-CM

## 2011-02-09 MED ORDER — ATORVASTATIN CALCIUM 40 MG PO TABS: 40.0000 mg | ORAL_TABLET | Freq: Every day | ORAL | Status: DC

## 2011-02-09 NOTE — Telephone Encounter (Signed)
Faxed.   KP 

## 2011-02-20 ENCOUNTER — Other Ambulatory Visit: Payer: Self-pay | Admitting: Cardiology

## 2011-02-20 DIAGNOSIS — I6529 Occlusion and stenosis of unspecified carotid artery: Secondary | ICD-10-CM

## 2011-02-23 ENCOUNTER — Encounter: Payer: Medicare Other | Admitting: Cardiology

## 2011-02-27 ENCOUNTER — Encounter (INDEPENDENT_AMBULATORY_CARE_PROVIDER_SITE_OTHER): Payer: BC Managed Care – PPO | Admitting: Cardiology

## 2011-02-27 DIAGNOSIS — I6529 Occlusion and stenosis of unspecified carotid artery: Secondary | ICD-10-CM

## 2011-03-02 ENCOUNTER — Encounter: Payer: Self-pay | Admitting: Internal Medicine

## 2011-03-27 ENCOUNTER — Ambulatory Visit (AMBULATORY_SURGERY_CENTER): Payer: BC Managed Care – PPO | Admitting: *Deleted

## 2011-03-27 DIAGNOSIS — Z860100 Personal history of colon polyps, unspecified: Secondary | ICD-10-CM

## 2011-03-27 DIAGNOSIS — Z1211 Encounter for screening for malignant neoplasm of colon: Secondary | ICD-10-CM

## 2011-03-27 MED ORDER — PEG-KCL-NACL-NASULF-NA ASC-C 100 G PO SOLR: ORAL | Status: DC

## 2011-04-03 ENCOUNTER — Encounter: Payer: Self-pay | Admitting: Internal Medicine

## 2011-04-03 ENCOUNTER — Ambulatory Visit (AMBULATORY_SURGERY_CENTER): Payer: BC Managed Care – PPO | Admitting: Internal Medicine

## 2011-04-03 VITALS — BP 139/63 | HR 89 | Temp 97.1°F | Resp 12 | Ht 63.6 in | Wt 132.0 lb

## 2011-04-03 DIAGNOSIS — D126 Benign neoplasm of colon, unspecified: Secondary | ICD-10-CM

## 2011-04-03 DIAGNOSIS — I2510 Atherosclerotic heart disease of native coronary artery without angina pectoris: Secondary | ICD-10-CM | POA: Diagnosis not present

## 2011-04-03 DIAGNOSIS — K5520 Angiodysplasia of colon without hemorrhage: Secondary | ICD-10-CM

## 2011-04-03 DIAGNOSIS — Z86010 Personal history of colonic polyps: Secondary | ICD-10-CM

## 2011-04-03 DIAGNOSIS — I739 Peripheral vascular disease, unspecified: Secondary | ICD-10-CM | POA: Diagnosis not present

## 2011-04-03 DIAGNOSIS — Z1211 Encounter for screening for malignant neoplasm of colon: Secondary | ICD-10-CM | POA: Diagnosis not present

## 2011-04-03 DIAGNOSIS — J449 Chronic obstructive pulmonary disease, unspecified: Secondary | ICD-10-CM | POA: Diagnosis not present

## 2011-04-03 DIAGNOSIS — I10 Essential (primary) hypertension: Secondary | ICD-10-CM | POA: Diagnosis not present

## 2011-04-03 DIAGNOSIS — D649 Anemia, unspecified: Secondary | ICD-10-CM | POA: Diagnosis not present

## 2011-04-03 MED ORDER — SODIUM CHLORIDE 0.9 % IV SOLN: 500.0000 mL | INTRAVENOUS | Status: DC

## 2011-04-03 NOTE — Patient Instructions (Addendum)
  Please read the handout given to you by your recovery room nurse.  Your results will be mailed to your home within 2 weeks.  You need to have another colonoscopy in 2 yrs.  Resume your routine medications today. If you have any questions, please call [phone]. Thank-you.

## 2011-04-03 NOTE — Progress Notes (Signed)
Patient did not have preoperative order for IV antibiotic SSI prophylaxis. (G8918)  Patient did not experience any of the following events: a burn prior to discharge; a fall within the facility; wrong site/side/patient/procedure/implant event; or a hospital transfer or hospital admission upon discharge from the facility. (G8907)  

## 2011-04-03 NOTE — Op Note (Signed)
[facility] [address] [place], [place]  [address]  COLONOSCOPY PROCEDURE REPORT  PATIENT:  Ronald Bell, Ronald Bell  MR#:  [id number] BIRTHDATE:  10/15/1940, 70 yrs. old  GENDER:  male ENDOSCOPIST:  Wilhemina Bonito. Eda Keys, MD REF. BY:  Surveillance Program Recall, PROCEDURE DATE:  04/03/2011 PROCEDURE:  Colonoscopy with snare polypectomy x 3 ASA CLASS:  Class II INDICATIONS:  history of pre-cancerous (adenomatous) colon polyps, surveillance and high-risk screening ; multiple prior exams; last 04-2010 piecemeal polyp MEDICATIONS:   MAC sedation, administered by CRNA, propofol (Diprivan) 250 mg IV  DESCRIPTION OF PROCEDURE:   After the risks benefits and alternatives of the procedure were thoroughly explained, informed consent was obtained.  Digital rectal exam was performed and revealed no abnormalities.   The [id number] endoscope was introduced through the anus and advanced to the cecum, which was identified by both the appendix and ileocecal valve, without limitations.  The quality of the prep was adequate, using MoviPrep.  The instrument was then slowly withdrawn as the colon was fully examined. <<PROCEDUREIMAGES>>  FINDINGS:  Three polyps were found - 6mm sessile residual in the ascending colon, and two 3mm transverse colon. Polyps were snared without cautery. Retrieval was successful. sessile area cauterized. 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 Otherwise normal colonoscopy without other polyps, masses, vascular ectasias, or inflammatory changes.  Retroflexed views in the rectum revealed no abnormalities.  The time to cecum = 2:20  minutes. The scope was then withdrawn in  16:33  minutes from the cecum and the procedure completed.  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  ______________________________ Wilhemina Bonito. Eda Keys, MD  CC:  Lelon Perla, DO;  The Patient  n. eSIGNED:   John N. Eda Keys at 04/03/2011 09:58 AM  Janine Ores, [id number]

## 2011-04-06 ENCOUNTER — Telehealth: Payer: Self-pay | Admitting: *Deleted

## 2011-04-06 NOTE — Telephone Encounter (Signed)

## 2011-04-07 ENCOUNTER — Encounter: Payer: Self-pay | Admitting: Internal Medicine

## 2011-04-27 ENCOUNTER — Telehealth: Payer: Self-pay | Admitting: *Deleted

## 2011-04-27 NOTE — Telephone Encounter (Signed)
[facility] Triage Call Report Triage Record Num: [id number] Operator: Durward Mallard DiMatteis Patient Name: Ronald Bell Call Date & Time: 04/27/2011 10:19:06AM Patient Phone: [phone] PCP: Lelon Perla Patient Gender: Male PCP Fax : [phone] Patient DOB: 08-Jul-1940 Practice Name: [facility] Reason for Call: Caller: [name]/Patient; PCP: Lelon Perla.; CB#: [phone]; Call regarding Cold Symptoms; started 04/24/11; sx include dry non prod cough, runny nose; no fever; taking Mucinex DM; triaged per URI Guideline; Homecare d/t runny nose and cough; will comply

## 2011-04-27 NOTE — Telephone Encounter (Signed)
Would need ov if no relief

## 2011-04-28 NOTE — Telephone Encounter (Signed)
Discussed with patient and he stated he had taken the Mucinex and he still feels bad, he asked about an Abx but I advise he would need an OV, he stated he would call tomorrow morning if he still felt bad for and apt.     KP

## 2011-05-01 ENCOUNTER — Ambulatory Visit (INDEPENDENT_AMBULATORY_CARE_PROVIDER_SITE_OTHER): Payer: Medicare Other | Admitting: Family Medicine

## 2011-05-01 ENCOUNTER — Encounter: Payer: Self-pay | Admitting: Family Medicine

## 2011-05-01 VITALS — BP 122/66 | HR 99 | Temp 97.8°F | Wt 135.8 lb

## 2011-05-01 DIAGNOSIS — J441 Chronic obstructive pulmonary disease with (acute) exacerbation: Secondary | ICD-10-CM

## 2011-05-01 DIAGNOSIS — J40 Bronchitis, not specified as acute or chronic: Secondary | ICD-10-CM | POA: Diagnosis not present

## 2011-05-01 MED ORDER — IPRATROPIUM-ALBUTEROL 20-100 MCG/ACT IN AERS: 1.0000 | INHALATION_SPRAY | Freq: Four times a day (QID) | RESPIRATORY_TRACT | Status: DC | PRN

## 2011-05-01 MED ORDER — IPRATROPIUM BROMIDE 0.02 % IN SOLN
0.5000 mg | Freq: Once | RESPIRATORY_TRACT | Status: AC
  Administered 2011-05-01: 0.5 mg via RESPIRATORY_TRACT

## 2011-05-01 MED ORDER — PREDNISONE 10 MG PO TABS: ORAL_TABLET | ORAL | Status: DC

## 2011-05-01 MED ORDER — CLARITHROMYCIN ER 500 MG PO TB24: ORAL_TABLET | ORAL | Status: DC

## 2011-05-01 MED ORDER — ALBUTEROL SULFATE (5 MG/ML) 0.5% IN NEBU
2.5000 mg | INHALATION_SOLUTION | Freq: Once | RESPIRATORY_TRACT | Status: AC
  Administered 2011-05-01: 2.5 mg via RESPIRATORY_TRACT

## 2011-05-01 MED ORDER — METHYLPREDNISOLONE ACETATE PF 80 MG/ML IJ SUSP
80.0000 mg | Freq: Once | INTRAMUSCULAR | Status: AC
  Administered 2011-05-01: 80 mg via INTRAMUSCULAR

## 2011-05-01 NOTE — Progress Notes (Signed)
  Subjective:     Ronald Bell is a 71 y.o. male here for evaluation of a cough. Onset of symptoms was 1 week ago. Symptoms have been gradually worsening since that time. The cough is productive and is aggravated by exercise and reclining position. Associated symptoms include: chills, shortness of breath, sputum production and wheezing. Patient does not have a history of asthma. Patient does have a history of environmental allergens. Patient has not traveled recently. Patient does have a history of smoking. Patient has not had a previous chest x-ray. Patient has not had a PPD done.  The following portions of the patient's history were reviewed and updated as appropriate: allergies, current medications, past family history, past medical history, past social history, past surgical history and problem list.  Review of Systems Pertinent items are noted in HPI.    Objective:    Oxygen saturation 96% on room air BP 122/66  Pulse 99  Temp(Src) 97.8 F (36.6 C) (Oral)  Wt 135 lb 12.8 oz (61.598 kg)  SpO2 96% General appearance: alert, cooperative, appears stated age and moderate distress Ears: normal TM's and external ear canals both ears Nose: clear discharge, turbinates red, swollen, edematous, no sinus tenderness Throat: lips, mucosa, and tongue normal; teeth and gums normal Neck: mild anterior cervical adenopathy, supple, symmetrical, trachea midline and thyroid not enlarged, symmetric, no tenderness/mass/nodules Lungs: diminished breath sounds bilaterally and wheezes bilaterally Heart: S1, S2 normal    Assessment:    Acute Bronchitis and COPD with exacerbation    Plan:    Antibiotics per medication orders. Antitussives per medication orders. Avoid exposure to tobacco smoke and fumes. B-agonist inhaler. Call if shortness of breath worsens, blood in sputum, change in character of cough, development of fever or chills, inability to maintain nutrition and hydration. Avoid exposure  to tobacco smoke and fumes. Steroid inhaler as ordered. Smoking cessation.

## 2011-05-01 NOTE — Patient Instructions (Signed)
Bronchitis Bronchitis is the body's way of reacting to injury and/or infection (inflammation) of the bronchi. Bronchi are the air tubes that extend from the windpipe into the lungs. If the inflammation becomes severe, it may cause shortness of breath. CAUSES  Inflammation may be caused by:  A virus.   Germs (bacteria).   Dust.   Allergens.   Pollutants and many other irritants.  The cells lining the bronchial tree are covered with tiny hairs (cilia). These constantly beat upward, away from the lungs, toward the mouth. This keeps the lungs free of pollutants. When these cells become too irritated and are unable to do their job, mucus begins to develop. This causes the characteristic cough of bronchitis. The cough clears the lungs when the cilia are unable to do their job. Without either of these protective mechanisms, the mucus would settle in the lungs. Then you would develop pneumonia. Smoking is a common cause of bronchitis and can contribute to pneumonia. Stopping this habit is the single most important thing you can do to help yourself. TREATMENT   Your caregiver may prescribe an antibiotic if the cough is caused by bacteria. Also, medicines that open up your airways make it easier to breathe. Your caregiver may also recommend or prescribe an expectorant. It will loosen the mucus to be coughed up. Only take over-the-counter or prescription medicines for pain, discomfort, or fever as directed by your caregiver.   Removing whatever causes the problem (smoking, for example) is critical to preventing the problem from getting worse.   Cough suppressants may be prescribed for relief of cough symptoms.   Inhaled medicines may be prescribed to help with symptoms now and to help prevent problems from returning.   For those with recurrent (chronic) bronchitis, there may be a need for steroid medicines.  SEEK IMMEDIATE MEDICAL CARE IF:   During treatment, you develop more pus-like mucus  (purulent sputum).   You have a fever.   Your baby is older than 3 months with a rectal temperature of 102 F (38.9 C) or higher.   Your baby is 67 months old or younger with a rectal temperature of 100.4 F (38 C) or higher.   You become progressively more ill.   You have increased difficulty breathing, wheezing, or shortness of breath.  It is necessary to seek immediate medical care if you are elderly or sick from any other disease. MAKE SURE YOU:   Understand these instructions.   Will watch your condition.   Will get help right away if you are not doing well or get worse.  Document Released: 03/09/2005 Document Revised: 11/19/2010 Document Reviewed: 01/17/2008 [facility] Patient Information 2012 [place], [place].  Chronic Obstructive Pulmonary Disease Chronic obstructive pulmonary disease (COPD) is a condition in which airflow from the lungs is restricted. The lungs can never return to normal, but there are measures you can take which will improve them and make you feel better. CAUSES   Smoking.   Exposure to secondhand smoke.   Breathing in irritants (pollution, cigarette smoke, strong smells, aerosol sprays, paint fumes).   History of lung infections.  TREATMENT  Treatment focuses on making you comfortable (supportive care). Your caregiver may prescribe medications (inhaled or pills) to help improve your breathing. HOME CARE INSTRUCTIONS   If you smoke, stop smoking.   Avoid exposure to smoke, chemicals, and fumes that aggravate your breathing.   Take antibiotic medicines as directed by your caregiver.   Avoid medicines that dry up your system and slow  down the elimination of secretions (antihistamines and cough syrups). This decreases respiratory capacity and may lead to infections.   Drink enough water and fluids to keep your urine clear or pale yellow. This loosens secretions.   Use humidifiers at home and at your bedside if they do not make breathing difficult.     Receive all protective vaccines your caregiver suggests, especially pneumococcal and influenza.   Use home oxygen as suggested.   Stay active. Exercise and physical activity will help maintain your ability to do things you want to do.   Eat a healthy diet.  SEEK MEDICAL CARE IF:   You develop pus-like mucus (sputum).   Breathing is more labored or exercise becomes difficult to do.   You are running out of the medicine you take for your breathing.  SEEK IMMEDIATE MEDICAL CARE IF:   You have a rapid heart rate.   You have agitation, confusion, tremors, or are in a stupor (family members may need to observe this).   It becomes difficult to breathe.   You develop chest pain.   You have a fever.  MAKE SURE YOU:   Understand these instructions.   Will watch your condition.   Will get help right away if you are not doing well or get worse.  Document Released: 12/17/2004 Document Revised: 11/19/2010 Document Reviewed: 05/09/2010 [facility] Patient Information 2012 [place], [place].

## 2011-06-12 DIAGNOSIS — H2510 Age-related nuclear cataract, unspecified eye: Secondary | ICD-10-CM | POA: Diagnosis not present

## 2011-07-24 ENCOUNTER — Encounter: Payer: Self-pay | Admitting: Family Medicine

## 2011-07-24 ENCOUNTER — Ambulatory Visit (INDEPENDENT_AMBULATORY_CARE_PROVIDER_SITE_OTHER): Payer: Medicare Other | Admitting: Family Medicine

## 2011-07-24 VITALS — BP 120/60 | HR 97 | Temp 98.1°F | Wt 139.2 lb

## 2011-07-24 DIAGNOSIS — I10 Essential (primary) hypertension: Secondary | ICD-10-CM | POA: Diagnosis not present

## 2011-07-24 DIAGNOSIS — Z87891 Personal history of nicotine dependence: Secondary | ICD-10-CM

## 2011-07-24 DIAGNOSIS — E785 Hyperlipidemia, unspecified: Secondary | ICD-10-CM

## 2011-07-24 DIAGNOSIS — K219 Gastro-esophageal reflux disease without esophagitis: Secondary | ICD-10-CM

## 2011-07-24 DIAGNOSIS — Z1289 Encounter for screening for malignant neoplasm of other sites: Secondary | ICD-10-CM

## 2011-07-24 DIAGNOSIS — J449 Chronic obstructive pulmonary disease, unspecified: Secondary | ICD-10-CM | POA: Diagnosis not present

## 2011-07-24 DIAGNOSIS — D509 Iron deficiency anemia, unspecified: Secondary | ICD-10-CM

## 2011-07-24 LAB — CBC WITH DIFFERENTIAL/PLATELET
Basophils Absolute: 0.1 10*3/uL (ref 0.0–0.1)
Eosinophils Absolute: 0.3 10*3/uL (ref 0.0–0.7)
Lymphocytes Relative: 9.2 % — ABNORMAL LOW (ref 12.0–46.0)
MCHC: 33.2 g/dL (ref 30.0–36.0)
Neutro Abs: 8.4 10*3/uL — ABNORMAL HIGH (ref 1.4–7.7)
Neutrophils Relative %: 76.1 % (ref 43.0–77.0)
Platelets: 284 10*3/uL (ref 150.0–400.0)
RDW: 14.6 % (ref 11.5–14.6)

## 2011-07-24 LAB — LIPID PANEL
HDL: 54.3 mg/dL (ref >39.00)
VLDL: 21.2 mg/dL (ref 0.0–40.0)

## 2011-07-24 LAB — HEPATIC FUNCTION PANEL
Albumin: 4.2 g/dL (ref 3.5–5.2)
Alkaline Phosphatase: 62 U/L (ref 39–117)
Total Bilirubin: 0.6 mg/dL (ref 0.3–1.2)

## 2011-07-24 LAB — BASIC METABOLIC PANEL
BUN: 12 mg/dL (ref 6–23)
CO2: 26 mEq/L (ref 19–32)
Calcium: 9.7 mg/dL (ref 8.4–10.5)
Creatinine, Ser: 0.5 mg/dL (ref 0.4–1.5)
Glucose, Bld: 96 mg/dL (ref 70–99)

## 2011-07-24 MED ORDER — AMLODIPINE BESYLATE 10 MG PO TABS: 10.0000 mg | ORAL_TABLET | Freq: Every day | ORAL | Status: DC

## 2011-07-24 MED ORDER — LISINOPRIL-HYDROCHLOROTHIAZIDE 20-12.5 MG PO TABS: 1.0000 | ORAL_TABLET | Freq: Every day | ORAL | Status: DC

## 2011-07-24 MED ORDER — FLUTICASONE-SALMETEROL 250-50 MCG/DOSE IN AEPB: 1.0000 | INHALATION_SPRAY | Freq: Two times a day (BID) | RESPIRATORY_TRACT | Status: DC

## 2011-07-24 MED ORDER — PANTOPRAZOLE SODIUM 40 MG PO TBEC: 40.0000 mg | DELAYED_RELEASE_TABLET | Freq: Every day | ORAL | Status: DC

## 2011-07-24 MED ORDER — NICOTINE 10 MG IN INHA: 1.0000 | RESPIRATORY_TRACT | Status: AC | PRN

## 2011-07-24 NOTE — Assessment & Plan Note (Signed)
Check PFTs next visit con't advair and combivent

## 2011-07-24 NOTE — Assessment & Plan Note (Signed)
Pt cutting down slowly May try inhaler

## 2011-07-24 NOTE — Progress Notes (Signed)
  Subjective:    Patient here for follow-up of elevated blood pressure.  He is not exercising and is adherent to a low-salt diet.  Blood pressure is not checked  at home. Cardiac symptoms: none. Patient denies: chest pain, chest pressure/discomfort, claudication, dyspnea, exertional chest pressure/discomfort, fatigue, irregular heart beat, lower extremity edema, near-syncope, orthopnea, palpitations, paroxysmal nocturnal dyspnea, syncope and tachypnea. Cardiovascular risk factors: advanced age (older than 106 for men, 65 for women), dyslipidemia, hypertension, male gender and smoking/ tobacco exposure. Use of agents associated with hypertension: none. History of target organ damage: none.  The following portions of the patient's history were reviewed and updated as appropriate: allergies, current medications, past family history, past medical history, past social history, past surgical history and problem list.  Review of Systems Pertinent items are noted in HPI.     Objective:    BP 120/60  Pulse 97  Temp(Src) 98.1 F (36.7 C) (Oral)  Wt 139 lb 3.2 oz (63.141 kg)  SpO2 96% General appearance: alert, cooperative, appears stated age and no distress Lungs: clear to auscultation bilaterally Heart: S1, S2 normal Extremities: extremities normal, atraumatic, no cyanosis or edema    Assessment:    Hypertension, normal blood pressure . Evidence of target organ damage: none.   hyperlipidemia---check labs Hx GI bleed----pt called requesting guaic cards last week----all heme positive---pt will f/u GI Plan:    Medication: no change. Dietary sodium restriction. Regular aerobic exercise. Check blood pressures 2-3 times weekly and record. Follow up: 6 months and as needed.

## 2011-07-24 NOTE — Assessment & Plan Note (Signed)
Stable con't meds 

## 2011-07-24 NOTE — Assessment & Plan Note (Signed)
Check labs con't meds 

## 2011-07-24 NOTE — Patient Instructions (Signed)

## 2011-07-24 NOTE — Progress Notes (Signed)
I thought so--- he asked staff for the cards and they just gave it to them.  Thanks  Miranda Frese

## 2011-07-24 NOTE — Assessment & Plan Note (Signed)
con't iron per GI

## 2011-07-24 NOTE — Assessment & Plan Note (Signed)
-  cont protonix

## 2011-08-06 ENCOUNTER — Ambulatory Visit (INDEPENDENT_AMBULATORY_CARE_PROVIDER_SITE_OTHER): Payer: Medicare Other | Admitting: Family Medicine

## 2011-08-06 ENCOUNTER — Encounter: Payer: Self-pay | Admitting: Family Medicine

## 2011-08-06 VITALS — BP 144/72 | HR 107 | Temp 98.2°F | Wt 136.4 lb

## 2011-08-06 DIAGNOSIS — T148XXA Other injury of unspecified body region, initial encounter: Secondary | ICD-10-CM | POA: Diagnosis not present

## 2011-08-06 DIAGNOSIS — L0390 Cellulitis, unspecified: Secondary | ICD-10-CM

## 2011-08-06 DIAGNOSIS — L0291 Cutaneous abscess, unspecified: Secondary | ICD-10-CM | POA: Diagnosis not present

## 2011-08-06 DIAGNOSIS — T148 Other injury of unspecified body region: Secondary | ICD-10-CM | POA: Diagnosis not present

## 2011-08-06 DIAGNOSIS — W57XXXA Bitten or stung by nonvenomous insect and other nonvenomous arthropods, initial encounter: Secondary | ICD-10-CM | POA: Diagnosis not present

## 2011-08-06 MED ORDER — CEPHALEXIN 500 MG PO CAPS: 500.0000 mg | ORAL_CAPSULE | Freq: Two times a day (BID) | ORAL | Status: DC

## 2011-08-06 MED ORDER — PREDNISONE 10 MG PO TABS: ORAL_TABLET | ORAL | Status: DC

## 2011-08-06 MED ORDER — METHYLPREDNISOLONE ACETATE 80 MG/ML IJ SUSP
80.0000 mg | Freq: Once | INTRAMUSCULAR | Status: AC
  Administered 2011-08-06: 80 mg via INTRAMUSCULAR

## 2011-08-06 NOTE — Progress Notes (Signed)
  Subjective:     Ronald Bell is a 71 y.o. male who presents for evaluation of a rash involving the ear and face. Rash started 4 days ago. Lesions are pink, and raised in texture. Rash has changed over time. Rash is painful and is pruritic. Associated symptoms: none. Patient denies: abdominal pain, arthralgia, congestion, cough, crankiness, decrease in appetite, decrease in energy level, fever, headache, irritability, myalgia, nausea, sore throat and vomiting. Patient has not had contacts with similar rash. Patient has had new exposures (soaps, lotions, laundry detergents, foods, medications, plants, insects or animals).  Pt thinks he was bit by insect in L ear.  The following portions of the patient's history were reviewed and updated as appropriate: allergies, current medications, past family history, past medical history, past social history, past surgical history and problem list.  Review of Systems Pertinent items are noted in HPI.    Objective:    BP 144/72  Pulse 107  Temp(Src) 98.2 F (36.8 C) (Oral)  Wt 136 lb 6.4 oz (61.871 kg)  SpO2 92% General:  alert, cooperative, appears stated age and no distress  Skin:  papules noted on face and L ear     Assessment:    bites, insect --- infected   Plan:    Medications: antibiotics: keflex and steroids: pred taper and depo medrol. Written patient instruction given. Follow up in a few days. prn

## 2011-08-06 NOTE — Patient Instructions (Signed)
Insect Bite Mosquitoes, flies, fleas, bedbugs, and many other insects can bite. Insect bites are different from insect stings. A sting is when venom is injected into the skin. Some insect bites can transmit infectious diseases. SYMPTOMS  Insect bites usually turn red, swell, and itch for 2 to 4 days. They often go away on their own. TREATMENT  Your caregiver may prescribe antibiotic medicines if a bacterial infection develops in the bite. HOME CARE INSTRUCTIONS  Do not scratch the bite area.   Keep the bite area clean and dry. Wash the bite area thoroughly with soap and water.   Put ice or cool compresses on the bite area.   Put ice in a plastic bag.   Place a towel between your skin and the bag.   Leave the ice on for 20 minutes, 4 times a day for the first 2 to 3 days, or as directed.   You may apply a baking soda paste, cortisone cream, or calamine lotion to the bite area as directed by your caregiver. This can help reduce itching and swelling.   Only take over-the-counter or prescription medicines as directed by your caregiver.   If you are given antibiotics, take them as directed. Finish them even if you start to feel better.  You may need a tetanus shot if:  You cannot remember when you had your last tetanus shot.   You have never had a tetanus shot.   The injury broke your skin.  If you get a tetanus shot, your arm may swell, get red, and feel warm to the touch. This is common and not a problem. If you need a tetanus shot and you choose not to have one, there is a rare chance of getting tetanus. Sickness from tetanus can be serious. SEEK IMMEDIATE MEDICAL CARE IF:   You have increased pain, redness, or swelling in the bite area.   You see a red line on the skin coming from the bite.   You have a fever.   You have joint pain.   You have a headache or neck pain.   You have unusual weakness.   You have a rash.   You have chest pain or shortness of breath.   You  have abdominal pain, nausea, or vomiting.   You feel unusually tired or sleepy.  MAKE SURE YOU:   Understand these instructions.   Will watch your condition.   Will get help right away if you are not doing well or get worse.  Document Released: 04/16/2004 Document Revised: 02/26/2011 Document Reviewed: 10/08/2010 Exit[employer] Patient Information 2012 [employer]. 

## 2011-08-10 ENCOUNTER — Ambulatory Visit (INDEPENDENT_AMBULATORY_CARE_PROVIDER_SITE_OTHER): Payer: Medicare Other | Admitting: Family Medicine

## 2011-08-10 ENCOUNTER — Encounter: Payer: Self-pay | Admitting: Family Medicine

## 2011-08-10 VITALS — BP 130/60 | HR 104 | Temp 98.1°F | Wt 136.0 lb

## 2011-08-10 DIAGNOSIS — L0201 Cutaneous abscess of face: Secondary | ICD-10-CM | POA: Diagnosis not present

## 2011-08-10 DIAGNOSIS — L03211 Cellulitis of face: Secondary | ICD-10-CM | POA: Diagnosis not present

## 2011-08-10 DIAGNOSIS — M1990 Unspecified osteoarthritis, unspecified site: Secondary | ICD-10-CM | POA: Diagnosis not present

## 2011-08-10 DIAGNOSIS — B028 Zoster with other complications: Secondary | ICD-10-CM | POA: Diagnosis not present

## 2011-08-10 MED ORDER — HYDROCODONE-ACETAMINOPHEN 5-500 MG PO TABS: 1.0000 | ORAL_TABLET | Freq: Three times a day (TID) | ORAL | Status: DC | PRN

## 2011-08-10 MED ORDER — DOXYCYCLINE HYCLATE 100 MG PO TABS: 100.0000 mg | ORAL_TABLET | Freq: Two times a day (BID) | ORAL | Status: AC

## 2011-08-10 MED ORDER — MOMETASONE FUROATE 0.1 % EX CREA: TOPICAL_CREAM | Freq: Every day | CUTANEOUS | Status: DC

## 2011-08-10 NOTE — Patient Instructions (Signed)

## 2011-08-10 NOTE — Progress Notes (Signed)
  Subjective:    Patient ID: Ronald Bell, male    DOB: 28-Oct-1940, 71 y.o.   MRN: [id number]  HPI Pt here f/u cellulitis/ insect bite L side face.  He is on abx and steroids and then area started "turning black" and swelling more.  He has several areas of crusting over and oozing.  He feels no pain.     Review of Systems As above    Objective:   Physical Exam  Constitutional: He appears well-developed and well-nourished.  Skin:       L side of face and neck---several areas of crusting over and oozing  + swelling around ear   Psychiatric: He has a normal mood and affect. His behavior is normal.          Assessment & Plan:  Cellulitis--- pt has no pain where rash is and never did---he has some pain in am and pm in bed                    Pt had though he was bit by something--? Brown recluse                   Pt never had blisters and rash goes to far for shingles ---- he was here too late to start antivirals anyway --he had rash 4 days before coming in                    Change to doxy---refer to derm asap

## 2011-08-12 ENCOUNTER — Other Ambulatory Visit: Payer: Self-pay | Admitting: Family Medicine

## 2011-08-12 DIAGNOSIS — I10 Essential (primary) hypertension: Secondary | ICD-10-CM

## 2011-08-12 MED ORDER — AMLODIPINE BESYLATE 10 MG PO TABS: 10.0000 mg | ORAL_TABLET | Freq: Every day | ORAL | Status: DC

## 2011-08-12 MED ORDER — LISINOPRIL-HYDROCHLOROTHIAZIDE 20-12.5 MG PO TABS: 1.0000 | ORAL_TABLET | Freq: Every day | ORAL | Status: DC

## 2011-08-12 NOTE — Telephone Encounter (Signed)
Rx faxed.    KP 

## 2011-08-13 LAB — WOUND CULTURE
Gram Stain: NONE SEEN
Gram Stain: NONE SEEN
Organism ID, Bacteria: NO GROWTH

## 2011-08-18 ENCOUNTER — Other Ambulatory Visit: Payer: Self-pay | Admitting: Family Medicine

## 2011-08-18 NOTE — Telephone Encounter (Signed)
Last seen and filled 08/10/11 # 20. Please advise    KP

## 2011-08-19 ENCOUNTER — Other Ambulatory Visit: Payer: Self-pay | Admitting: Family Medicine

## 2011-08-19 MED ORDER — HYDROCODONE-ACETAMINOPHEN 5-500 MG PO TABS: 1.0000 | ORAL_TABLET | Freq: Three times a day (TID) | ORAL | Status: DC | PRN

## 2011-08-19 NOTE — Telephone Encounter (Signed)
Pt called back stating he has 1 more vicodin left and he would like [place] to call his rx into [facility].

## 2011-08-19 NOTE — Telephone Encounter (Signed)
I called [facility] pharmacy & they confirmed they never got the rx that was sent in on 5.28.13. I re-sent rx to [facility].

## 2011-08-19 NOTE — Telephone Encounter (Signed)
Ok to send another presription of Vicodin #20 with zero refills to new pharmacy?

## 2011-08-19 NOTE — Telephone Encounter (Signed)
Please send prescription to a new Drug Store Per wife [facility] can not find prescription, so she now wants it sent to [facility] which is there normal Drug Store Can calrice back at [phone]

## 2011-08-24 ENCOUNTER — Ambulatory Visit (INDEPENDENT_AMBULATORY_CARE_PROVIDER_SITE_OTHER): Payer: Medicare Other | Admitting: Family Medicine

## 2011-08-24 ENCOUNTER — Encounter: Payer: Self-pay | Admitting: Family Medicine

## 2011-08-24 VITALS — BP 134/86 | HR 102 | Temp 98.0°F | Wt 130.6 lb

## 2011-08-24 DIAGNOSIS — L0201 Cutaneous abscess of face: Secondary | ICD-10-CM

## 2011-08-24 DIAGNOSIS — L03211 Cellulitis of face: Secondary | ICD-10-CM | POA: Diagnosis not present

## 2011-08-24 DIAGNOSIS — B029 Zoster without complications: Secondary | ICD-10-CM | POA: Diagnosis not present

## 2011-08-24 DIAGNOSIS — W57XXXA Bitten or stung by nonvenomous insect and other nonvenomous arthropods, initial encounter: Secondary | ICD-10-CM

## 2011-08-24 MED ORDER — PREDNISONE 10 MG PO TABS: ORAL_TABLET | ORAL | Status: DC

## 2011-08-24 MED ORDER — METHYLPREDNISOLONE ACETATE 80 MG/ML IJ SUSP
80.0000 mg | Freq: Once | INTRAMUSCULAR | Status: AC
  Administered 2011-08-24: 80 mg via INTRAMUSCULAR

## 2011-08-24 MED ORDER — HYDROCODONE-ACETAMINOPHEN 10-325 MG PO TABS: 1.0000 | ORAL_TABLET | Freq: Three times a day (TID) | ORAL | Status: DC | PRN

## 2011-08-24 MED ORDER — MOMETASONE FUROATE 0.1 % EX CREA: TOPICAL_CREAM | Freq: Every day | CUTANEOUS | Status: DC

## 2011-08-24 NOTE — Patient Instructions (Signed)

## 2011-08-24 NOTE — Progress Notes (Signed)
  Subjective:     Ronald Bell is a 71 y.o. male who presents for evaluation of a rash involving the face. Rash started several days ago. Lesions are pink, and blistering in texture. Rash has changed over time. Rash is painful. Associated symptoms: none. Patient denies: abdominal pain, arthralgia, congestion, cough, crankiness, decrease in appetite, decrease in energy level, fever, headache, irritability, myalgia, nausea, sore throat and vomiting. Patient has not had contacts with similar rash. Patient has not had new exposures (soaps, lotions, laundry detergents, foods, medications, plants, insects or animals).  The following portions of the patient's history were reviewed and updated as appropriate: allergies, current medications, past family history, past medical history, past social history, past surgical history and problem list.  Review of Systems Pertinent items are noted in HPI.    Objective:    BP 134/86  Pulse 102  Temp(Src) 98 F (36.7 C) (Oral)  Wt 130 lb 9.6 oz (59.24 kg)  SpO2 96% General:  alert, cooperative and appears stated age  Skin:  vesicles noted on face     Assessment:    shingles    Plan:    Medications: steroids: depo medrol and pred taper and finish valtrex per derm. Verbal patient instruction given. Follow up in several days.

## 2011-08-25 ENCOUNTER — Telehealth: Payer: Self-pay | Admitting: Family Medicine

## 2011-08-25 MED ORDER — GABAPENTIN 100 MG PO CAPS: ORAL_CAPSULE | ORAL | Status: DC

## 2011-08-25 NOTE — Telephone Encounter (Signed)
Discussed with patient and he voiced understanding. Rx faxed     KP 

## 2011-08-25 NOTE — Telephone Encounter (Signed)
Please advise      KP 

## 2011-08-25 NOTE — Telephone Encounter (Signed)
Patient called and stated the Hydrocodone-Acetaminophen (Tab) NORCO 10-325 MG Only works for a few hours and he is requesting something stronger, please callat [phone]

## 2011-08-25 NOTE — Telephone Encounter (Signed)
Add gabepentin 100 mg  1 po qhs for 3-4 days then he can take it bid--- after few days increase to tid ----- we can increase it from there if not working----#60

## 2011-08-28 ENCOUNTER — Other Ambulatory Visit: Payer: Self-pay

## 2011-08-28 DIAGNOSIS — B029 Zoster without complications: Secondary | ICD-10-CM

## 2011-08-28 MED ORDER — HYDROCODONE-ACETAMINOPHEN 10-325 MG PO TABS: 1.0000 | ORAL_TABLET | Freq: Three times a day (TID) | ORAL | Status: DC | PRN

## 2011-08-28 NOTE — Telephone Encounter (Signed)
Refill faxed.      KP

## 2011-08-31 ENCOUNTER — Telehealth: Payer: Self-pay | Admitting: Family Medicine

## 2011-08-31 MED ORDER — GABAPENTIN 100 MG PO CAPS: ORAL_CAPSULE | ORAL | Status: DC

## 2011-08-31 NOTE — Telephone Encounter (Signed)
Patient was already told to take a stool softner on Friday, Do you have any other suggestions. Please advise     KP

## 2011-08-31 NOTE — Telephone Encounter (Signed)
Discussed with patient and he voiced understanding and agreed to take a stool softener, miralax and will increase fiber and drink plenty of water. He also wanted to know if you would put him on a stronger pain medication because he said the hydrocodone is not working

## 2011-08-31 NOTE — Telephone Encounter (Signed)
Discussed with patient and he voiced understanding, New Rx faxed         KP

## 2011-08-31 NOTE — Telephone Encounter (Signed)
would like to speak wt/someone regarding medications he is currently on. Patient states hs is on Valacyclovir for shingles and 650mg  of iron daily and is having terrible stools that are requiring him to take a laxative and his stools are still painful. please call at [phone]

## 2011-08-31 NOTE — Telephone Encounter (Signed)
Increase to 2 tid

## 2011-08-31 NOTE — Telephone Encounter (Signed)
con't stool softener Use miralax daily Increase fiber in diet  F/u GI if none of these work

## 2011-08-31 NOTE — Telephone Encounter (Signed)
That norco is already very strong---- I started him on neurontin--- what dose is he up to on that.  We can increase that.

## 2011-08-31 NOTE — Telephone Encounter (Signed)
He is taking 2 pills daily of the Gabapentin. Please advise     KP

## 2011-09-03 ENCOUNTER — Other Ambulatory Visit: Payer: Self-pay | Admitting: Family Medicine

## 2011-09-03 NOTE — Telephone Encounter (Signed)
Last seen 08/24/11 and filled 08/28/11 # 30. Please advise    KP

## 2011-09-03 NOTE — Telephone Encounter (Signed)
refill hydeocodone/apap 10/325 tab #30 Take one tablet every 8-hours as needed for pain  Last fill 6.10.13 Last ov 6.3.13

## 2011-09-03 NOTE — Telephone Encounter (Signed)
It was just filled

## 2011-09-04 ENCOUNTER — Telehealth: Payer: Self-pay | Admitting: Family Medicine

## 2011-09-04 ENCOUNTER — Telehealth: Payer: Self-pay | Admitting: Internal Medicine

## 2011-09-04 DIAGNOSIS — B029 Zoster without complications: Secondary | ICD-10-CM

## 2011-09-04 MED ORDER — HYDROCODONE-ACETAMINOPHEN 10-325 MG PO TABS: 1.0000 | ORAL_TABLET | Freq: Three times a day (TID) | ORAL | Status: DC | PRN

## 2011-09-04 NOTE — Telephone Encounter (Signed)
Requested Hydrocodone Last filled 08/28/11 # 30. Please advise, he also stated the Gabapentin is helping as well and he has also complete the Prednisone yesterday, he is improving but still having some pain in his back.    KP

## 2011-09-04 NOTE — Telephone Encounter (Signed)
Rx faxed and patient has been made aware letter printed to excuse patient from his flight on June 10th and mailed to the patient.     KP

## 2011-09-04 NOTE — Telephone Encounter (Signed)
Pt states he needs more hydrocodone. He would like Kim to call him back.

## 2011-09-04 NOTE — Telephone Encounter (Signed)
Spoke with pt and he states that the medicine he is taking for his shingles along with his iron tablets are making him constipated. Pt has been taking miralax and a stool softener. Pt wants to know if it is ok for him to continue taking the miralax and stool softener. Discussed with pt that it is ok for him to take them both. Pt aware.

## 2011-09-04 NOTE — Telephone Encounter (Signed)
Ok to refill x 1  

## 2011-09-08 ENCOUNTER — Telehealth: Payer: Self-pay | Admitting: Family Medicine

## 2011-09-08 DIAGNOSIS — B029 Zoster without complications: Secondary | ICD-10-CM

## 2011-09-08 NOTE — Telephone Encounter (Signed)
Patient stated that he has had to increase both medications, he said he is taking hydrocodone every 5 hours and Gabapentin 2 tablets every 4 hours. He stated the rash was going away, but the pain is the same, it has not increased but if he is not taking the medication the pan come back. He stated he was told by someone at work that they used a Derma-patch, he is unclear of which patch but will call tomorrow to let me know.     KP

## 2011-09-08 NOTE — Telephone Encounter (Signed)
Increase gabepentin to 300 mg 2 po tid  lidoderm patch  1 patch to intact skin---- for up to 12 h a day.   #20  Pt can not put on open sores.

## 2011-09-08 NOTE — Telephone Encounter (Signed)
Pt states he has questions for Selena Batten about his hydrocodone and gabapentin. He also has questions about using a derma patch for Shingles. Best # [phone]

## 2011-09-09 MED ORDER — LIDOCAINE 5 % EX PTCH: MEDICATED_PATCH | CUTANEOUS | Status: DC

## 2011-09-09 MED ORDER — GABAPENTIN 300 MG PO CAPS: 600.0000 mg | ORAL_CAPSULE | Freq: Three times a day (TID) | ORAL | Status: DC

## 2011-09-09 NOTE — Telephone Encounter (Signed)
msg left for patient to call back. Rx faxed     KP

## 2011-09-10 MED ORDER — HYDROCODONE-ACETAMINOPHEN 10-325 MG PO TABS: 1.0000 | ORAL_TABLET | Freq: Three times a day (TID) | ORAL | Status: DC | PRN

## 2011-09-10 NOTE — Telephone Encounter (Signed)
Patient aware and he voiced understanding, he will scheduled a follow up prn.     KP

## 2011-09-10 NOTE — Telephone Encounter (Signed)
Discussed with patient and he voiced understanding    KP 

## 2011-09-10 NOTE — Telephone Encounter (Signed)
Hydrocodone refill requested last seen 08/24/11 and filled 09/04/11 #30. Please advise    KP

## 2011-09-10 NOTE — Telephone Encounter (Signed)
Refill #60 ---if he still has severe pain---ov

## 2011-09-14 ENCOUNTER — Other Ambulatory Visit: Payer: Self-pay | Admitting: Family Medicine

## 2011-09-21 ENCOUNTER — Telehealth: Payer: Self-pay | Admitting: Family Medicine

## 2011-09-21 DIAGNOSIS — B029 Zoster without complications: Secondary | ICD-10-CM

## 2011-09-21 MED ORDER — HYDROCODONE-ACETAMINOPHEN 10-325 MG PO TABS: 1.0000 | ORAL_TABLET | ORAL | Status: DC | PRN

## 2011-09-21 MED ORDER — HYDROCODONE-ACETAMINOPHEN 10-325 MG PO TABS: 1.0000 | ORAL_TABLET | Freq: Three times a day (TID) | ORAL | Status: DC | PRN

## 2011-09-21 NOTE — Telephone Encounter (Signed)
Patient made aware and will follow up in 1 week if no improvement.   Rx faxed   KP

## 2011-09-21 NOTE — Telephone Encounter (Signed)
Pt incated that he has been taking HYDROcodone-acetaminophen (NORCO) 10-325 MG every 5 hours instead of every 8 as Rx due to the level of pain. Pt is requesting a refill and would like to know if strength can be increased on med. Last filled 09-10-11 #60, last OV 08-24-11. Pt notes he only has a few days left .Please advise    Pt uses [facility]

## 2011-09-21 NOTE — Telephone Encounter (Signed)
He has a high dose already---- can do q4 h---- but he needs ov if pain does not start to improve Refill x1

## 2011-09-21 NOTE — Telephone Encounter (Signed)
Pt has question about his pain medication for shingles.

## 2011-09-28 ENCOUNTER — Ambulatory Visit (INDEPENDENT_AMBULATORY_CARE_PROVIDER_SITE_OTHER): Payer: Medicare Other | Admitting: Family Medicine

## 2011-09-28 ENCOUNTER — Encounter: Payer: Self-pay | Admitting: Family Medicine

## 2011-09-28 VITALS — BP 130/62 | HR 98 | Temp 97.9°F | Wt 130.8 lb

## 2011-09-28 DIAGNOSIS — B029 Zoster without complications: Secondary | ICD-10-CM | POA: Diagnosis not present

## 2011-09-28 DIAGNOSIS — B0229 Other postherpetic nervous system involvement: Secondary | ICD-10-CM

## 2011-09-28 MED ORDER — PREDNISONE 10 MG PO TABS: ORAL_TABLET | ORAL | Status: DC

## 2011-09-28 MED ORDER — DICLOFENAC SODIUM 1 % TD GEL: 1.0000 "application " | Freq: Four times a day (QID) | TRANSDERMAL | Status: DC

## 2011-09-28 NOTE — Progress Notes (Signed)
  Subjective:    Patient ID: Ronald Bell, male    DOB: 05-Feb-1941, 71 y.o.   MRN: [id number]  HPI Pt here f/u shingles.    Pt is much better but is still having pain in neck , esp in am.   Review of Systems as above   Objective:   Physical Exam  Constitutional: He appears well-developed and well-nourished.  Skin: Skin is warm and dry. No rash noted.  Psychiatric: He has a normal mood and affect. His behavior is normal. Judgment and thought content normal.          Assessment & Plan:

## 2011-09-28 NOTE — Assessment & Plan Note (Signed)
Cont neurontin con't pain med Add voltaren gel rto prn

## 2011-10-05 ENCOUNTER — Other Ambulatory Visit: Payer: Self-pay | Admitting: Family Medicine

## 2011-10-05 DIAGNOSIS — B029 Zoster without complications: Secondary | ICD-10-CM

## 2011-10-05 MED ORDER — HYDROCODONE-ACETAMINOPHEN 10-325 MG PO TABS: 1.0000 | ORAL_TABLET | ORAL | Status: DC | PRN

## 2011-10-05 NOTE — Telephone Encounter (Signed)
Refill hydrocodone apap 10/325 tab #60, take one tablet every 8 hours as needed for pain, last fill 06.20.13 Last ov 7.8.13

## 2011-10-05 NOTE — Telephone Encounter (Signed)
Last seen and filled 09/28/11 . Please advise    KP

## 2011-10-05 NOTE — Addendum Note (Signed)
Addended by: Arnette Norris on: 10/05/2011 05:16 PM   Modules accepted: Orders

## 2011-10-06 ENCOUNTER — Other Ambulatory Visit: Payer: Self-pay | Admitting: Family Medicine

## 2011-10-06 MED ORDER — GABAPENTIN 300 MG PO CAPS: 600.0000 mg | ORAL_CAPSULE | Freq: Three times a day (TID) | ORAL | Status: DC

## 2011-10-06 NOTE — Telephone Encounter (Signed)
Ok to refill x1---- 5 refills 

## 2011-10-06 NOTE — Telephone Encounter (Signed)
Please advise if refill is ok.     KP

## 2011-10-06 NOTE — Addendum Note (Signed)
Addended by: Arnette Norris on: 10/06/2011 05:32 PM   Modules accepted: Orders

## 2011-10-15 ENCOUNTER — Other Ambulatory Visit: Payer: Self-pay | Admitting: Family Medicine

## 2011-10-15 ENCOUNTER — Telehealth: Payer: Self-pay | Admitting: Family Medicine

## 2011-10-15 DIAGNOSIS — B0229 Other postherpetic nervous system involvement: Secondary | ICD-10-CM

## 2011-10-15 DIAGNOSIS — B029 Zoster without complications: Secondary | ICD-10-CM

## 2011-10-15 MED ORDER — HYDROCODONE-ACETAMINOPHEN 10-325 MG PO TABS: 1.0000 | ORAL_TABLET | ORAL | Status: DC | PRN

## 2011-10-15 NOTE — Telephone Encounter (Signed)
Refill: Hydrocodone/apap 10-325 tab. Take 1 tablet every 4 hours as needed for pain. Qty 60. Last fill 10-05-11

## 2011-10-15 NOTE — Telephone Encounter (Signed)
Last seen 09/28/11 and filled 10/05/11 # 60 taking every 4 hours prn. Please advise   KP

## 2011-10-15 NOTE — Telephone Encounter (Signed)
Refill x1--- pt needs pain management to see if a block can be done

## 2011-10-15 NOTE — Telephone Encounter (Signed)
Rx faxed.    KP 

## 2011-10-22 ENCOUNTER — Telehealth: Payer: Self-pay | Admitting: Family Medicine

## 2011-10-22 DIAGNOSIS — B029 Zoster without complications: Secondary | ICD-10-CM

## 2011-10-22 MED ORDER — DICLOFENAC SODIUM 1 % TD GEL: 1.0000 "application " | Freq: Four times a day (QID) | TRANSDERMAL | Status: DC

## 2011-10-22 MED ORDER — HYDROCODONE-ACETAMINOPHEN 10-325 MG PO TABS: 1.0000 | ORAL_TABLET | ORAL | Status: DC | PRN

## 2011-10-22 NOTE — Telephone Encounter (Signed)
Patient was last seen 09/28/11. Please advise    KP

## 2011-10-22 NOTE — Telephone Encounter (Signed)
Refill both x1 Also pt wife apparently was not happy with pain referral even though pt was.  They are waiting for him to call them back for appointment.

## 2011-10-22 NOTE — Telephone Encounter (Signed)
Refill: Voltaren 1% gel. Apply to the affected external area four times a day. Qty 100. Last fill 10-16-11 Hydrocodone/apap 10-325 tab. Take 1 tablet every 4 hours as needed for pain. Qty 60. Last fill 10-15-11

## 2011-10-30 ENCOUNTER — Telehealth: Payer: Self-pay | Admitting: Family Medicine

## 2011-10-30 DIAGNOSIS — B029 Zoster without complications: Secondary | ICD-10-CM

## 2011-10-30 MED ORDER — HYDROCODONE-ACETAMINOPHEN 10-325 MG PO TABS: 1.0000 | ORAL_TABLET | ORAL | Status: DC | PRN

## 2011-10-30 NOTE — Telephone Encounter (Signed)
Refill x1 and I want to see him next week.  He should not still be in pain

## 2011-10-30 NOTE — Telephone Encounter (Signed)
Discussed with patient Rx faxed and he will come in on Thursday at 2pm.    KP

## 2011-10-30 NOTE — Telephone Encounter (Signed)
Refill: Hydrocodone/apap 10-325 tab. Take 1 tablet every 4 hours as needed for pain. Qty 60. Last fill 10-22-11

## 2011-10-30 NOTE — Telephone Encounter (Signed)
Please advise      KP 

## 2011-11-02 ENCOUNTER — Other Ambulatory Visit: Payer: Self-pay | Admitting: Family Medicine

## 2011-11-02 MED ORDER — GABAPENTIN 300 MG PO CAPS: 600.0000 mg | ORAL_CAPSULE | Freq: Three times a day (TID) | ORAL | Status: DC

## 2011-11-02 NOTE — Telephone Encounter (Signed)
Refill Gabapentin (Cap) NEURONTIN 300 MG Take 2 capsules (600 mg total) by mouth 3 (three) times daily. #150 - last fill 7.16.13  Last ov 7.8.13 follow up shingles

## 2011-11-05 ENCOUNTER — Encounter: Payer: Self-pay | Admitting: Family Medicine

## 2011-11-05 ENCOUNTER — Ambulatory Visit (INDEPENDENT_AMBULATORY_CARE_PROVIDER_SITE_OTHER): Payer: Medicare Other | Admitting: Family Medicine

## 2011-11-05 VITALS — BP 130/62 | HR 89 | Temp 98.3°F | Wt 133.6 lb

## 2011-11-05 DIAGNOSIS — B0229 Other postherpetic nervous system involvement: Secondary | ICD-10-CM

## 2011-11-05 NOTE — Patient Instructions (Signed)
Postherpetic Neuralgia  Shingles is a painful disease. It is caused by the herpes zoster virus. This is the same virus which also causes chickenpox. It can affect the torso, limbs, or the face. For most people, shingles is a condition of rather sudden onset. Pain usually lasts about 1 month.  In older patients, or patients with poor immune systems, a painful, long-standing (chronic) condition called postherpetic neuralgia can develop. This condition rarely happens before age 50. But at least 50% of people over 50 become affected following an attack of shingles. There is a natural tendency for this condition to improve over time with no treatment. Less than 5% of patients have pain that lasts for more than 1 year.  DIAGNOSIS   Herpes is usually easily diagnosed on physical exam. Pain sometimes follows when the skin sores (lesions) have disappeared. It is called postherpetic neuralgia. That name simply means the pain that follows herpes.  TREATMENT    Treating this condition may be difficult. Usually one of the tricyclic antidepressants, often amitriptyline, is the first line of treatment. There is evidence that the sooner these medications are given, the more likely they are to reduce pain.   Conventional analgesics, regional nerve blocks, and anticonvulsants have little benefit in most cases when used alone. Other tricyclic anti-depressants are used as a second option if the first antidepressant is unsuccessful.   Anticonvulsants, including carbamazepine, have been found to provide some added benefit when used with a tricyclic anti-depressant. This is especially for the stabbing type of pain similar to that of trigeminal neuralgia.   Chronic opioid therapy. This is a strong narcotic pain medication. It is used to treat pain that is resistant to other measures. The issues of dependency and tolerance can be reduced with closely managed care.   Some cream treatments are applied locally to the affected area. They  can help when used with other treatments. Their use may be difficult in the case of postherpetic trigeminal neuralgia. This is involved with the face. So the substances can irritate the eye and the skin around the eye. Examples of creams used include Capsaicin and lidocaine creams.   For shingles, antiviral therapies along with analgesics are recommended. Studies of the effect of anti-viral agents such as acyclovir on shingles have been done. They show improved rates of healing and decreased severity of sudden (acute) pain. Some observations suggest that nerve blocks during shingles infection will:   Reduce pain.   Shorten the acute episode.   Prevent the emergence of postherpetic neuralgia.  Viral medications used include Acyclovir (Zovirax), Valacyclovir, Famciclovir and a lysine diet.  Document Released: 05/30/2002 Document Revised: 02/26/2011 Document Reviewed: 03/09/2005  [employer] Patient Information 2012 [employer].

## 2011-11-05 NOTE — Progress Notes (Signed)
  Subjective:    Patient ID: Ronald Bell, male    DOB: 1940-10-25, 71 y.o.   MRN: [id number]  HPI Pt here f/u shingles.  Pain is getting better --he take 3 "pain pills" a day and he feels like it gets better daily. No new complaints. Review of Systems As above    Objective:   Physical Exam  Constitutional: He is oriented to person, place, and time. He appears well-developed and well-nourished. No distress.  Neurological: He is alert and oriented to person, place, and time.  Skin: Skin is warm and dry. No rash noted. No erythema.  Psychiatric: He has a normal mood and affect. His behavior is normal. Judgment and thought content normal.          Assessment & Plan:

## 2011-11-05 NOTE — Assessment & Plan Note (Signed)
Improving con't meds Will wait another couple of weeks and then consider neurology if no better--pt agreed

## 2011-11-09 ENCOUNTER — Ambulatory Visit: Payer: PRIVATE HEALTH INSURANCE | Admitting: Family Medicine

## 2011-11-09 ENCOUNTER — Telehealth: Payer: Self-pay | Admitting: Family Medicine

## 2011-11-09 DIAGNOSIS — B029 Zoster without complications: Secondary | ICD-10-CM

## 2011-11-09 NOTE — Telephone Encounter (Signed)
Last seen 11/05/11 and filled 10/30/11 #60. Please advise    KP

## 2011-11-09 NOTE — Telephone Encounter (Signed)
When pt was here last he said he was doing a lot better.  i was hoping he would not need it so soon.

## 2011-11-09 NOTE — Telephone Encounter (Signed)
Refill x one but if he is still needing th meds then the pain is really no better

## 2011-11-09 NOTE — Telephone Encounter (Signed)
Spoke with patient and he stated he had enough for 3 more days and his wife called the refill. He is taking the medication 3 times a day and the pain has gotten better but when the pain medication wears off he is back in 7/10 pain.

## 2011-11-09 NOTE — Telephone Encounter (Signed)
Refill: Hydrocodone/apap 10-325mg  tab. Take 1 tablet every 4 hours as needed for pain. Qty 60. Last fill 10-31-11

## 2011-11-10 MED ORDER — HYDROCODONE-ACETAMINOPHEN 10-325 MG PO TABS: 1.0000 | ORAL_TABLET | ORAL | Status: DC | PRN

## 2011-11-18 ENCOUNTER — Telehealth: Payer: Self-pay | Admitting: Family Medicine

## 2011-11-18 NOTE — Telephone Encounter (Signed)
It was just refilled --- too soon

## 2011-11-18 NOTE — Telephone Encounter (Signed)
Refill Hydrocodone-Acetaminophen (Tab) 10-325 MG Take 1 tablet by mouth every 4 (four) hours as needed for pain #60 LAST FILL 8.20.13   NOTE last phone note for refill of this medication copied below Loreen Freud, DO 11/09/2011 5:30 PM Signed  Refill x one but if he is still needing th meds then the pain is really no better  Last ov 8.15.13 medication follow up

## 2011-11-18 NOTE — Telephone Encounter (Signed)
Last OV 11-05-11 last refill noted 11-10-11 #60 no refills directions Q4hrs/PRN

## 2011-11-25 ENCOUNTER — Other Ambulatory Visit: Payer: Self-pay

## 2011-11-25 DIAGNOSIS — B029 Zoster without complications: Secondary | ICD-10-CM

## 2011-11-25 MED ORDER — HYDROCODONE-ACETAMINOPHEN 10-325 MG PO TABS: 1.0000 | ORAL_TABLET | ORAL | Status: DC | PRN

## 2011-11-25 NOTE — Telephone Encounter (Signed)
Last seen 11/05/11 and filled 11/10/11 # 60. Please advise     KP

## 2011-12-07 ENCOUNTER — Other Ambulatory Visit: Payer: Self-pay | Admitting: Family Medicine

## 2011-12-07 DIAGNOSIS — B029 Zoster without complications: Secondary | ICD-10-CM

## 2011-12-07 NOTE — Telephone Encounter (Signed)
Last seen 11/05/11 and filled 11/25/11 #60. Please advise    KP

## 2011-12-07 NOTE — Telephone Encounter (Signed)
Hydrocodone/ APAP 10/325 tablet Qty:60 Last refill: 11/25/11 Take one tablet every 4 hours as needed for pain

## 2011-12-07 NOTE — Telephone Encounter (Signed)
im very concerned that he still needs pain meds so many months out--- refill x 1 but I really need him to make it last a month--- if he can not we need to discuss options

## 2011-12-08 MED ORDER — HYDROCODONE-ACETAMINOPHEN 10-325 MG PO TABS: 1.0000 | ORAL_TABLET | ORAL | Status: DC | PRN

## 2011-12-08 NOTE — Telephone Encounter (Signed)
Wife aware Rx being faxed and agreed to follow up as needed.     KP

## 2011-12-30 ENCOUNTER — Other Ambulatory Visit: Payer: Self-pay | Admitting: Family Medicine

## 2011-12-30 DIAGNOSIS — B029 Zoster without complications: Secondary | ICD-10-CM

## 2011-12-30 NOTE — Telephone Encounter (Signed)
Refill x1 

## 2011-12-30 NOTE — Telephone Encounter (Signed)
Please advise      KP 

## 2011-12-30 NOTE — Telephone Encounter (Signed)
refill Hydrocodone APAP 10/325 tab #60  take one tablet every 4 hours as needed for pain--last fill 9.17.13, last ov 8.15.13 f/u Meds MGMT--NOTE CPE scheduled for 11.22.13

## 2011-12-31 MED ORDER — HYDROCODONE-ACETAMINOPHEN 10-325 MG PO TABS: 1.0000 | ORAL_TABLET | ORAL | Status: DC | PRN

## 2012-01-12 ENCOUNTER — Other Ambulatory Visit: Payer: Self-pay | Admitting: Family Medicine

## 2012-01-12 DIAGNOSIS — B029 Zoster without complications: Secondary | ICD-10-CM

## 2012-01-12 NOTE — Telephone Encounter (Signed)
Last filled 12/31/11 # 60. Please advise      KP

## 2012-01-12 NOTE — Telephone Encounter (Signed)
refill Hydrocodone APAP 10/325 tab #60  take one tablet every 4 hours as needed for pain--last fill 10.10.13, last ov 8.15.13 f/u Meds MGMT--NOTE CPE scheduled for 11.22.13

## 2012-01-12 NOTE — Telephone Encounter (Signed)
Refill #30; this should be taken as needed only.He has been taking 5 pills / day. There is a significant risk related to his age. This can be discussed with Dr. Laury Axon upon her return.

## 2012-01-13 MED ORDER — HYDROCODONE-ACETAMINOPHEN 10-325 MG PO TABS: 1.0000 | ORAL_TABLET | ORAL | Status: AC | PRN

## 2012-01-13 NOTE — Telephone Encounter (Signed)
Wife has been made aware the Rx will be faxed.     KP

## 2012-01-22 DIAGNOSIS — I10 Essential (primary) hypertension: Secondary | ICD-10-CM | POA: Diagnosis not present

## 2012-01-22 DIAGNOSIS — I2510 Atherosclerotic heart disease of native coronary artery without angina pectoris: Secondary | ICD-10-CM | POA: Diagnosis not present

## 2012-01-22 DIAGNOSIS — F17200 Nicotine dependence, unspecified, uncomplicated: Secondary | ICD-10-CM | POA: Diagnosis not present

## 2012-01-22 DIAGNOSIS — R011 Cardiac murmur, unspecified: Secondary | ICD-10-CM | POA: Diagnosis not present

## 2012-01-22 DIAGNOSIS — E7800 Pure hypercholesterolemia, unspecified: Secondary | ICD-10-CM | POA: Diagnosis not present

## 2012-01-26 ENCOUNTER — Other Ambulatory Visit: Payer: Self-pay | Admitting: Family Medicine

## 2012-01-26 DIAGNOSIS — B029 Zoster without complications: Secondary | ICD-10-CM

## 2012-01-26 MED ORDER — HYDROCODONE-ACETAMINOPHEN 10-325 MG PO TABS: 1.0000 | ORAL_TABLET | ORAL | Status: AC | PRN

## 2012-01-26 NOTE — Telephone Encounter (Signed)
refill Hydrocodone APAP 10/325 tab #30  take one tablet every 4 hours as needed for pain--last fill 10.10.13, last ov 8.15.13 f/u Meds MGMT--NOTE CPE scheduled for 11.22.13

## 2012-01-26 NOTE — Telephone Encounter (Signed)
Rx faxed.    KP 

## 2012-01-26 NOTE — Telephone Encounter (Signed)
Refill x1 

## 2012-01-26 NOTE — Telephone Encounter (Signed)
Please advise      KP 

## 2012-01-29 DIAGNOSIS — I10 Essential (primary) hypertension: Secondary | ICD-10-CM | POA: Diagnosis not present

## 2012-01-29 DIAGNOSIS — R011 Cardiac murmur, unspecified: Secondary | ICD-10-CM | POA: Diagnosis not present

## 2012-01-29 DIAGNOSIS — I2510 Atherosclerotic heart disease of native coronary artery without angina pectoris: Secondary | ICD-10-CM | POA: Diagnosis not present

## 2012-01-29 DIAGNOSIS — F17200 Nicotine dependence, unspecified, uncomplicated: Secondary | ICD-10-CM | POA: Diagnosis not present

## 2012-01-29 DIAGNOSIS — E7800 Pure hypercholesterolemia, unspecified: Secondary | ICD-10-CM | POA: Diagnosis not present

## 2012-02-04 ENCOUNTER — Other Ambulatory Visit: Payer: Self-pay | Admitting: Family Medicine

## 2012-02-04 ENCOUNTER — Telehealth: Payer: Self-pay | Admitting: Family Medicine

## 2012-02-04 NOTE — Telephone Encounter (Signed)
Too soon

## 2012-02-04 NOTE — Telephone Encounter (Signed)
Last seen 11/05/11 and filled 01/26/12 # 60. Please advise     KP

## 2012-02-04 NOTE — Telephone Encounter (Signed)
Refill: Hydrocodone/apap 10/325 mg tab. Take 1 tablet by mouth every 4 hours as needed for pain. Qty 60. Last fill 01-26-12

## 2012-02-08 ENCOUNTER — Telehealth: Payer: Self-pay | Admitting: Family Medicine

## 2012-02-08 NOTE — Telephone Encounter (Signed)
Too soon

## 2012-02-08 NOTE — Telephone Encounter (Signed)
Last seen 11/05/11 and filled 01/26/12 # 60. Please advise     KP 

## 2012-02-08 NOTE — Telephone Encounter (Signed)
Refill: Hydrocodone/apap 10/325 mg tab. Take 1 tablet by mouth every 4 hours as needed for pain. Qty 60. Last fill 01-26-12 °

## 2012-02-10 ENCOUNTER — Telehealth: Payer: Self-pay | Admitting: Family Medicine

## 2012-02-10 NOTE — Telephone Encounter (Signed)
msg left to call the office     KP 

## 2012-02-10 NOTE — Telephone Encounter (Signed)
Spoke with the patient and he stated he is only taking 1 pill in the afternoon. He not having so much pain, but he still gets a burning sensation periodically, he is not out of meds, he has 10 left and thinks his wife requested the Rx for him. He is not needing the medication and does have a follow up on Friday.     KP

## 2012-02-10 NOTE — Telephone Encounter (Signed)
Way too soon.  He should not still have pain.   If he is still having so much pain we really need to discuss the possibility of a specialist.

## 2012-02-10 NOTE — Telephone Encounter (Signed)
Refill: Hydrocodone/apap 10/325 tab. Take 1 tablet by mouth every 4 hours as needed for pain. Qty 60. Last fill 01-26-12

## 2012-02-10 NOTE — Telephone Encounter (Signed)
Last seen 11/05/11 and filled 01/26/12 # 60. Please advise     KP 

## 2012-02-11 NOTE — Telephone Encounter (Signed)
Will d/w pt on Friday.

## 2012-02-12 ENCOUNTER — Ambulatory Visit (INDEPENDENT_AMBULATORY_CARE_PROVIDER_SITE_OTHER): Payer: Medicare Other | Admitting: Family Medicine

## 2012-02-12 ENCOUNTER — Encounter: Payer: Self-pay | Admitting: Family Medicine

## 2012-02-12 VITALS — BP 140/60 | HR 95 | Temp 97.8°F | Ht 62.5 in | Wt 136.8 lb

## 2012-02-12 DIAGNOSIS — R350 Frequency of micturition: Secondary | ICD-10-CM

## 2012-02-12 DIAGNOSIS — Z23 Encounter for immunization: Secondary | ICD-10-CM | POA: Diagnosis not present

## 2012-02-12 DIAGNOSIS — Z0000 Encounter for general adult medical examination without abnormal findings: Secondary | ICD-10-CM | POA: Diagnosis not present

## 2012-02-12 DIAGNOSIS — Z87891 Personal history of nicotine dependence: Secondary | ICD-10-CM

## 2012-02-12 DIAGNOSIS — I10 Essential (primary) hypertension: Secondary | ICD-10-CM

## 2012-02-12 DIAGNOSIS — E785 Hyperlipidemia, unspecified: Secondary | ICD-10-CM | POA: Diagnosis not present

## 2012-02-12 DIAGNOSIS — Z125 Encounter for screening for malignant neoplasm of prostate: Secondary | ICD-10-CM

## 2012-02-12 DIAGNOSIS — D500 Iron deficiency anemia secondary to blood loss (chronic): Secondary | ICD-10-CM | POA: Diagnosis not present

## 2012-02-12 DIAGNOSIS — R0989 Other specified symptoms and signs involving the circulatory and respiratory systems: Secondary | ICD-10-CM | POA: Diagnosis not present

## 2012-02-12 DIAGNOSIS — J449 Chronic obstructive pulmonary disease, unspecified: Secondary | ICD-10-CM

## 2012-02-12 DIAGNOSIS — B0229 Other postherpetic nervous system involvement: Secondary | ICD-10-CM

## 2012-02-12 DIAGNOSIS — J4489 Other specified chronic obstructive pulmonary disease: Secondary | ICD-10-CM

## 2012-02-12 LAB — POCT URINALYSIS DIPSTICK
Bilirubin, UA: NEGATIVE
Blood, UA: NEGATIVE
Glucose, UA: NEGATIVE
Spec Grav, UA: <=1.005
pH, UA: 6

## 2012-02-12 LAB — LIPID PANEL
Cholesterol: 134 mg/dL (ref 0–200)
HDL: 41.4 mg/dL (ref >39.00)
Total CHOL/HDL Ratio: 3
Triglycerides: 121 mg/dL (ref 0.0–149.0)

## 2012-02-12 LAB — BASIC METABOLIC PANEL
BUN: 11 mg/dL (ref 6–23)
CO2: 28 mEq/L (ref 19–32)
Chloride: 96 mEq/L (ref 96–112)
Creatinine, Ser: 0.7 mg/dL (ref 0.4–1.5)
Potassium: 4.7 mEq/L (ref 3.5–5.1)

## 2012-02-12 LAB — HEPATIC FUNCTION PANEL
ALT: 40 U/L (ref 0–53)
Bilirubin, Direct: 0.1 mg/dL (ref 0.0–0.3)
Total Bilirubin: 0.4 mg/dL (ref 0.3–1.2)
Total Protein: 7.2 g/dL (ref 6.0–8.3)

## 2012-02-12 LAB — CBC WITH DIFFERENTIAL/PLATELET
Basophils Relative: 0.9 % (ref 0.0–3.0)
Eosinophils Absolute: 0.3 10*3/uL (ref 0.0–0.7)
Eosinophils Relative: 3.4 % (ref 0.0–5.0)
HCT: 38.9 % — ABNORMAL LOW (ref 39.0–52.0)
Lymphs Abs: 1 10*3/uL (ref 0.7–4.0)
MCHC: 32.9 g/dL (ref 30.0–36.0)
MCV: 100.2 fl — ABNORMAL HIGH (ref 78.0–100.0)
Monocytes Absolute: 1 10*3/uL (ref 0.1–1.0)
Neutrophils Relative %: 74.3 % (ref 43.0–77.0)
Platelets: 310 10*3/uL (ref 150.0–400.0)

## 2012-02-12 MED ORDER — BUDESONIDE-FORMOTEROL FUMARATE 160-4.5 MCG/ACT IN AERO: 2.0000 | INHALATION_SPRAY | Freq: Two times a day (BID) | RESPIRATORY_TRACT | Status: DC

## 2012-02-12 MED ORDER — ATORVASTATIN CALCIUM 40 MG PO TABS: ORAL_TABLET | ORAL | Status: DC

## 2012-02-12 MED ORDER — HYDROCODONE-ACETAMINOPHEN 10-325 MG PO TABS: 1.0000 | ORAL_TABLET | Freq: Three times a day (TID) | ORAL | Status: DC | PRN

## 2012-02-12 MED ORDER — LISINOPRIL-HYDROCHLOROTHIAZIDE 20-12.5 MG PO TABS: 1.0000 | ORAL_TABLET | Freq: Every day | ORAL | Status: DC

## 2012-02-12 NOTE — Assessment & Plan Note (Signed)
con't gabepentin and pt stilll needs 1 norco a day

## 2012-02-12 NOTE — Assessment & Plan Note (Signed)
Pt would like to try smoke away

## 2012-02-12 NOTE — Patient Instructions (Addendum)
Preventive Care for Adults, Male A healthy lifestyle and preventive care can promote health and wellness. Preventive health guidelines for men include the following key practices:  A routine yearly physical is a good way to check with your caregiver about your health and preventative screening. It is a chance to share any concerns and updates on your health, and to receive a thorough exam.  Visit your dentist for a routine exam and preventative care every 6 months. Brush your teeth twice a day and floss once a day. Good oral hygiene prevents tooth decay and gum disease.  The frequency of eye exams is based on your age, health, family medical history, use of contact lenses, and other factors. Follow your caregiver's recommendations for frequency of eye exams.  Eat a healthy diet. Foods like vegetables, fruits, whole grains, low-fat dairy products, and lean protein foods contain the nutrients you need without too many calories. Decrease your intake of foods high in solid fats, added sugars, and salt. Eat the right amount of calories for you.Get information about a proper diet from your caregiver, if necessary.  Regular physical exercise is one of the most important things you can do for your health. Most adults should get at least 150 minutes of moderate-intensity exercise (any activity that increases your heart rate and causes you to sweat) each week. In addition, most adults need muscle-strengthening exercises on 2 or more days a week.  Maintain a healthy weight. The body mass index (BMI) is a screening tool to identify possible weight problems. It provides an estimate of body fat based on height and weight. Your caregiver can help determine your BMI, and can help you achieve or maintain a healthy weight.For adults 20 years and older:  A BMI below 18.5 is considered underweight.  A BMI of 18.5 to 24.9 is normal.  A BMI of 25 to 29.9 is considered overweight.  A BMI of 30 and above is  considered obese.  Maintain normal blood lipids and cholesterol levels by exercising and minimizing your intake of saturated fat. Eat a balanced diet with plenty of fruit and vegetables. Blood tests for lipids and cholesterol should begin at age 20 and be repeated every 5 years. If your lipid or cholesterol levels are high, you are over 50, or you are a high risk for heart disease, you may need your cholesterol levels checked more frequently.Ongoing high lipid and cholesterol levels should be treated with medicines if diet and exercise are not effective.  If you smoke, find out from your caregiver how to quit. If you do not use tobacco, do not start.  If you choose to drink alcohol, do not exceed 2 drinks per day. One drink is considered to be 12 ounces (355 mL) of beer, 5 ounces (148 mL) of wine, or 1.5 ounces (44 mL) of liquor.  Avoid use of street drugs. Do not share needles with anyone. Ask for help if you need support or instructions about stopping the use of drugs.  High blood pressure causes heart disease and increases the risk of stroke. Your blood pressure should be checked at least every 1 to 2 years. Ongoing high blood pressure should be treated with medicines, if weight loss and exercise are not effective.  If you are 45 to 73 years old, ask your caregiver if you should take aspirin to prevent heart disease.  Diabetes screening involves taking a blood sample to check your fasting blood sugar level. This should be done once every 3 years,   after age 45, if you are within normal weight and without risk factors for diabetes. Testing should be considered at a younger age or be carried out more frequently if you are overweight and have at least 1 risk factor for diabetes.  Colorectal cancer can be detected and often prevented. Most routine colorectal cancer screening begins at the age of 50 and continues through age 75. However, your caregiver may recommend screening at an earlier age if you  have risk factors for colon cancer. On a yearly basis, your caregiver may provide home test kits to check for hidden blood in the stool. Use of a small camera at the end of a tube, to directly examine the colon (sigmoidoscopy or colonoscopy), can detect the earliest forms of colorectal cancer. Talk to your caregiver about this at age 50, when routine screening begins. Direct examination of the colon should be repeated every 5 to 10 years through age 75, unless early forms of pre-cancerous polyps or small growths are found.  Hepatitis C blood testing is recommended for all people born from 1945 through 1965 and any individual with known risks for hepatitis C.  Practice safe sex. Use condoms and avoid high-risk sexual practices to reduce the spread of sexually transmitted infections (STIs). STIs include gonorrhea, chlamydia, syphilis, trichomonas, herpes, HPV, and human immunodeficiency virus (HIV). Herpes, HIV, and HPV are viral illnesses that have no cure. They can result in disability, cancer, and death.  A one-time screening for abdominal aortic aneurysm (AAA) and surgical repair of large AAAs by sound wave imaging (ultrasonography) is recommended for ages 65 to 75 years who are current or former smokers.  Healthy men should no longer receive prostate-specific antigen (PSA) blood tests as part of routine cancer screening. Consult with your caregiver about prostate cancer screening.  Testicular cancer screening is not recommended for adult males who have no symptoms. Screening includes self-exam, caregiver exam, and other screening tests. Consult with your caregiver about any symptoms you have or any concerns you have about testicular cancer.  Use sunscreen with skin protection factor (SPF) of 30 or more. Apply sunscreen liberally and repeatedly throughout the day. You should seek shade when your shadow is shorter than you. Protect yourself by wearing long sleeves, pants, a wide-brimmed hat, and  sunglasses year round, whenever you are outdoors.  Once a month, do a whole body skin exam, using a mirror to look at the skin on your back. Notify your caregiver of new moles, moles that have irregular borders, moles that are larger than a pencil eraser, or moles that have changed in shape or color.  Stay current with required immunizations.  Influenza. You need a dose every fall (or winter). The composition of the flu vaccine changes each year, so being vaccinated once is not enough.  Pneumococcal polysaccharide. You need 1 to 2 doses if you smoke cigarettes or if you have certain chronic medical conditions. You need 1 dose at age 65 (or older) if you have never been vaccinated.  Tetanus, diphtheria, pertussis (Tdap, Td). Get 1 dose of Tdap vaccine if you are younger than age 65 years, are over 65 and have contact with an infant, are a healthcare worker, or simply want to be protected from whooping cough. After that, you need a Td booster dose every 10 years. Consult your caregiver if you have not had at least 3 tetanus and diphtheria-containing shots sometime in your life or have a deep or dirty wound.  HPV. This vaccine is recommended   for males 13 through 73 years of age. This vaccine may be given to men 22 through 73 years of age who have not completed the 3 dose series. It is recommended for men through age 26 who have sex with men or whose immune system is weakened because of HIV infection, other illness, or medications. The vaccine is given in 3 doses over 6 months.  Measles, mumps, rubella (MMR). You need at least 1 dose of MMR if you were born in 1957 or later. You may also need a 2nd dose.  Meningococcal. If you are age 19 to 21 years and a first-year college student living in a residence hall, or have one of several medical conditions, you need to get vaccinated against meningococcal disease. You may also need additional booster doses.  Zoster (shingles). If you are age 60 years or  older, you should get this vaccine.  Varicella (chickenpox). If you have never had chickenpox or you were vaccinated but received only 1 dose, talk to your caregiver to find out if you need this vaccine.  Hepatitis A. You need this vaccine if you have a specific risk factor for hepatitis A virus infection, or you simply wish to be protected from this disease. The vaccine is usually given as 2 doses, 6 to 18 months apart.  Hepatitis B. You need this vaccine if you have a specific risk factor for hepatitis B virus infection or you simply wish to be protected from this disease. The vaccine is given in 3 doses, usually over 6 months. Preventative Service / Frequency Ages 19 to 39  Blood pressure check.** / Every 1 to 2 years.  Lipid and cholesterol check.** / Every 5 years beginning at age 20.  Hepatitis C blood test.** / For any individual with known risks for hepatitis C.  Skin self-exam. / Monthly.  Influenza immunization.** / Every year.  Pneumococcal polysaccharide immunization.** / 1 to 2 doses if you smoke cigarettes or if you have certain chronic medical conditions.  Tetanus, diphtheria, pertussis (Tdap,Td) immunization. / A one-time dose of Tdap vaccine. After that, you need a Td booster dose every 10 years.  HPV immunization. / 3 doses over 6 months, if 26 and younger.  Measles, mumps, rubella (MMR) immunization. / You need at least 1 dose of MMR if you were born in 1957 or later. You may also need a 2nd dose.  Meningococcal immunization. / 1 dose if you are age 19 to 21 years and a first-year college student living in a residence hall, or have one of several medical conditions, you need to get vaccinated against meningococcal disease. You may also need additional booster doses.  Varicella immunization.** / Consult your caregiver.  Hepatitis A immunization.** / Consult your caregiver. 2 doses, 6 to 18 months apart.  Hepatitis B immunization.** / Consult your caregiver. 3 doses  usually over 6 months. Ages 40 to 64  Blood pressure check.** / Every 1 to 2 years.  Lipid and cholesterol check.** / Every 5 years beginning at age 20.  Fecal occult blood test (FOBT) of stool. / Every year beginning at age 50 and continuing until age 75. You may not have to do this test if you get colonoscopy every 10 years.  Flexible sigmoidoscopy** or colonoscopy.** / Every 5 years for a flexible sigmoidoscopy or every 10 years for a colonoscopy beginning at age 50 and continuing until age 75.  Hepatitis C blood test.** / For all people born from 1945 through 1965 and any   individual with known risks for hepatitis C.  Skin self-exam. / Monthly.  Influenza immunization.** / Every year.  Pneumococcal polysaccharide immunization.** / 1 to 2 doses if you smoke cigarettes or if you have certain chronic medical conditions.  Tetanus, diphtheria, pertussis (Tdap/Td) immunization.** / A one-time dose of Tdap vaccine. After that, you need a Td booster dose every 10 years.  Measles, mumps, rubella (MMR) immunization. / You need at least 1 dose of MMR if you were born in 1957 or later. You may also need a 2nd dose.  Varicella immunization.**/ Consult your caregiver.  Meningococcal immunization.** / Consult your caregiver.  Hepatitis A immunization.** / Consult your caregiver. 2 doses, 6 to 18 months apart.  Hepatitis B immunization.** / Consult your caregiver. 3 doses, usually over 6 months. Ages 65 and over  Blood pressure check.** / Every 1 to 2 years.  Lipid and cholesterol check.**/ Every 5 years beginning at age 20.  Fecal occult blood test (FOBT) of stool. / Every year beginning at age 50 and continuing until age 75. You may not have to do this test if you get colonoscopy every 10 years.  Flexible sigmoidoscopy** or colonoscopy.** / Every 5 years for a flexible sigmoidoscopy or every 10 years for a colonoscopy beginning at age 50 and continuing until age 75.  Hepatitis C blood  test.** / For all people born from 1945 through 1965 and any individual with known risks for hepatitis C.  Abdominal aortic aneurysm (AAA) screening.** / A one-time screening for ages 65 to 75 years who are current or former smokers.  Skin self-exam. / Monthly.  Influenza immunization.** / Every year.  Pneumococcal polysaccharide immunization.** / 1 dose at age 65 (or older) if you have never been vaccinated.  Tetanus, diphtheria, pertussis (Tdap, Td) immunization. / A one-time dose of Tdap vaccine if you are over 65 and have contact with an infant, are a healthcare worker, or simply want to be protected from whooping cough. After that, you need a Td booster dose every 10 years.  Varicella immunization. ** / Consult your caregiver.  Meningococcal immunization.** / Consult your caregiver.  Hepatitis A immunization. ** / Consult your caregiver. 2 doses, 6 to 18 months apart.  Hepatitis B immunization.** / Check with your caregiver. 3 doses, usually over 6 months. **Family history and personal history of risk and conditions may change your caregiver's recommendations. Document Released: 05/05/2001 Document Revised: 06/01/2011 Document Reviewed: 08/04/2010 [employer] Patient Information 2013 [employer].  

## 2012-02-12 NOTE — Assessment & Plan Note (Signed)
Check labs today con't meds 

## 2012-02-12 NOTE — Progress Notes (Signed)
Subjective:    Ronald Bell is a 71 y.o. male who presents for Medicare Annual/Subsequent preventive examination.   Preventive Screening-Counseling & Management  Tobacco History  Smoking status  . Current Every Day Smoker -- 1.0 packs/day for 55 years  . Types: Cigarettes  Smokeless tobacco  . Not on file    Problems Prior to Visit 1.   Current Problems (verified) Patient Active Problem List  Diagnosis  . Benign Neoplasm of Colon  . HYPERLIPIDEMIA  . ANEMIA, SECONDARY TO BLOOD LOSS  . ANEMIA-IRON DEFICIENCY  . UNSPECIFIED ANEMIA  . OTHER SPECIFIED DISEASE OF WHITE BLOOD CELLS  . ERECTILE DYSFUNCTION  . HYPERTENSION  . OTHER SPECIFIED DISORDERS OF ARTERIES&ARTERIOLES  . POLYP OF NASAL CAVITY  . COPD  . GERD  . ANGIODYSPLASIA-DUODENUM-STOMACH  . ANGIODYSPLASIA-INTESTINE  . HEMOCCULT POSITIVE STOOL  . GI BLEEDING  . ROSACEA  . NECK PAIN, LEFT  . CAROTID BRUITS, BILATERAL  . PERSONAL HX COLONIC POLYPS  . BARRETT'S ESOPHAGUS, HX OF  . TOBACCO ABUSE, HX OF  . Postherpetic neuralgia    Medications Prior to Visit Current Outpatient Prescriptions on File Prior to Visit  Medication Sig Dispense Refill  . amLODipine (NORVASC) 10 MG tablet Take 1 tablet (10 mg total) by mouth daily.  30 tablet  5  . Ascorbic Acid (VITAMIN C) 250 MG tablet Take 500 mg by mouth daily.       Marland Kitchen aspirin 81 MG EC tablet Take 81 mg by mouth daily.        . COMBIVENT RESPIMAT 20-100 MCG/ACT AERS respimat INHALE 1 PUFF BY MOUTH INTO THE LUNGS FOUR TIMES A DAY IF NEEDED.  1 Inhaler  1  . diclofenac sodium (VOLTAREN) 1 % GEL Apply 1 application topically 4 (four) times daily.  100 g  0  . Ferrous Sulfate (IRON) 325 (65 FE) MG TABS Take 1 tablet by mouth 2 (two) times daily.  90 each  3  . gabapentin (NEURONTIN) 300 MG capsule Take 2 capsules (600 mg total) by mouth 3 (three) times daily.  150 capsule  5  . Multiple Vitamins-Minerals (CENTRUM SILVER PO) Take by mouth daily.        .  pantoprazole (PROTONIX) 40 MG tablet Take 1 tablet (40 mg total) by mouth daily.  30 tablet  11  . sildenafil (VIAGRA) 100 MG tablet Take 1 tablet (100 mg total) by mouth daily as needed.  10 tablet  0  . [DISCONTINUED] atorvastatin (LIPITOR) 40 MG tablet TAKE 1 TABLET BY MOUTH DAILY.  90 tablet  1  . [DISCONTINUED] lisinopril-hydrochlorothiazide (PRINZIDE,ZESTORETIC) 20-12.5 MG per tablet Take 1 tablet by mouth daily.  30 tablet  5  . budesonide-formoterol (SYMBICORT) 160-4.5 MCG/ACT inhaler Inhale 2 puffs into the lungs 2 (two) times daily.  1 Inhaler  11    Current Medications (verified) Current Outpatient Prescriptions  Medication Sig Dispense Refill  . amLODipine (NORVASC) 10 MG tablet Take 1 tablet (10 mg total) by mouth daily.  30 tablet  5  . Ascorbic Acid (VITAMIN C) 250 MG tablet Take 500 mg by mouth daily.       Marland Kitchen aspirin 81 MG EC tablet Take 81 mg by mouth daily.        Marland Kitchen atorvastatin (LIPITOR) 40 MG tablet 1 po qhs  90 tablet  3  . COMBIVENT RESPIMAT 20-100 MCG/ACT AERS respimat INHALE 1 PUFF BY MOUTH INTO THE LUNGS FOUR TIMES A DAY IF NEEDED.  1 Inhaler  1  .  diclofenac sodium (VOLTAREN) 1 % GEL Apply 1 application topically 4 (four) times daily.  100 g  0  . Ferrous Sulfate (IRON) 325 (65 FE) MG TABS Take 1 tablet by mouth 2 (two) times daily.  90 each  3  . gabapentin (NEURONTIN) 300 MG capsule Take 2 capsules (600 mg total) by mouth 3 (three) times daily.  150 capsule  5  . lisinopril-hydrochlorothiazide (PRINZIDE,ZESTORETIC) 20-12.5 MG per tablet Take 1 tablet by mouth daily.  90 tablet  3  . Multiple Vitamins-Minerals (CENTRUM SILVER PO) Take by mouth daily.        . pantoprazole (PROTONIX) 40 MG tablet Take 1 tablet (40 mg total) by mouth daily.  30 tablet  11  . sildenafil (VIAGRA) 100 MG tablet Take 1 tablet (100 mg total) by mouth daily as needed.  10 tablet  0  . [DISCONTINUED] atorvastatin (LIPITOR) 40 MG tablet TAKE 1 TABLET BY MOUTH DAILY.  90 tablet  1  .  [DISCONTINUED] lisinopril-hydrochlorothiazide (PRINZIDE,ZESTORETIC) 20-12.5 MG per tablet Take 1 tablet by mouth daily.  30 tablet  5  . budesonide-formoterol (SYMBICORT) 160-4.5 MCG/ACT inhaler Inhale 2 puffs into the lungs 2 (two) times daily.  1 Inhaler  11  . HYDROcodone-acetaminophen (NORCO) 10-325 MG per tablet Take 1 tablet by mouth every 8 (eight) hours as needed for pain.  30 tablet  0     Allergies (verified) Review of patient's allergies indicates no known allergies.   PAST HISTORY  Family History Family History  Problem Relation Age of Onset  . Lung disease Mother     pulm fibrosis  . Uterine cancer    . Colitis Father   . Colon cancer Neg Hx   . Heart disease Brother   . Heart disease Daughter     cad    Social History History  Substance Use Topics  . Smoking status: Current Every Day Smoker -- 1.0 packs/day for 55 years    Types: Cigarettes  . Smokeless tobacco: Not on file  . Alcohol Use: Yes    Are there smokers in your home (other than you)?  Yes  Risk Factors Current exercise habits: The patient does not participate in regular exercise at present.  Dietary issues discussed: na   Cardiac risk factors: advanced age (older than 74 for men, 94 for women), dyslipidemia, hypertension, male gender, sedentary lifestyle and smoking/ tobacco exposure.  Depression Screen (Note: if answer to either of the following is "Yes", a more complete depression screening is indicated)   Q1: Over the past two weeks, have you felt down, depressed or hopeless? No  Q2: Over the past two weeks, have you felt little interest or pleasure in doing things? No  Have you lost interest or pleasure in daily life? No  Do you often feel hopeless? No  Do you cry easily over simple problems? No  Activities of Daily Living In your present state of health, do you have any difficulty performing the following activities?:  Driving? No Managing money?  No Feeding yourself? No Getting from  bed to chair? No Climbing a flight of stairs? No Preparing food and eating?: No Bathing or showering? No Getting dressed: No Getting to the toilet? No Using the toilet:No Moving around from place to place: No In the past year have you fallen or had a near fall?:No   Are you sexually active?  Yes  Do you have more than one partner?  No  Hearing Difficulties: No Do you often ask people  to speak up or repeat themselves? No Do you experience ringing or noises in your ears? No Do you have difficulty understanding soft or whispered voices? No   Do you feel that you have a problem with memory? No  Do you often misplace items? No  Do you feel safe at home?  Yes  Cognitive Testing  Alert? Yes  Normal Appearance?Yes  Oriented to person? Yes  Place? Yes   Time? Yes  Recall of three objects?  Yes  Can perform simple calculations? Yes  Displays appropriate judgment?Yes  Can read the correct time from a watch face?Yes   Advanced Directives have been discussed with the patient? Yes   List the Names of Other Physician/Practitioners you currently use: 1.  GI---[facility]  2.  Cardio-- [facility] 3.  opht--[facility]   Indicate any recent Medical Services you may have received from other than [facility] providers in the past year (date may be approximate).  Immunization History  Administered Date(s) Administered  . Influenza Split 01/23/2011  . Influenza Whole 01/28/2007, 12/14/2007, 12/14/2008, 01/17/2010  . Pneumococcal Polysaccharide 12/18/2003, 11/30/2008  . Td 03/23/2002  . Zoster 01/23/2011    Screening Tests Health Maintenance  Topic Date Due  . Influenza Vaccine  11/22/2011  . Tetanus/tdap  03/23/2012  . Colonoscopy  04/02/2013  . Pneumococcal Polysaccharide Vaccine Age 31 And Over  Completed  . Zostavax  Completed    All answers were reviewed with the patient and necessary referrals were made:  Loreen Freud, DO   02/12/2012   History reviewed:  He  has a past medical history of  Other symptoms involving cardiovascular system; Polyp of nasal cavity; Other specified disorders of arteries and arterioles; Cervicalgia; Routine general medical examination at a health care facility; Rosacea; Benign neoplasm of colon; Personal history of tobacco use, presenting hazards to health; Personal history of other diseases of digestive system; Psychosexual dysfunction with inhibited sexual excitement; Chronic airway obstruction, not elsewhere classified; Unspecified essential hypertension; Other and unspecified hyperlipidemia; GERD (gastroesophageal reflux disease); Anemia; Barrett esophagus; Arthritis; Blood transfusion (2010); and Adenomatous colon polyp. He  does not have any pertinent problems on file. He  has past surgical history that includes Colon surgery and Iliac artery stent. His family history includes Colitis in his father; Heart disease in his brother and daughter; Lung disease in his mother; and Uterine cancer in an unspecified family member.  There is no history of Colon cancer. He  reports that he has been smoking Cigarettes.  He has a 55 pack-year smoking history. He does not have any smokeless tobacco history on file. He reports that he drinks alcohol. He reports that he does not use illicit drugs. He has a current medication list which includes the following prescription(s): amlodipine, vitamin c, aspirin, atorvastatin, combivent respimat, diclofenac sodium, iron, gabapentin, lisinopril-hydrochlorothiazide, multiple vitamins-minerals, pantoprazole, sildenafil, budesonide-formoterol, and hydrocodone-acetaminophen. Current Outpatient Prescriptions on File Prior to Visit  Medication Sig Dispense Refill  . amLODipine (NORVASC) 10 MG tablet Take 1 tablet (10 mg total) by mouth daily.  30 tablet  5  . Ascorbic Acid (VITAMIN C) 250 MG tablet Take 500 mg by mouth daily.       Marland Kitchen aspirin 81 MG EC tablet Take 81 mg by mouth daily.        . COMBIVENT RESPIMAT 20-100 MCG/ACT AERS respimat  INHALE 1 PUFF BY MOUTH INTO THE LUNGS FOUR TIMES A DAY IF NEEDED.  1 Inhaler  1  . diclofenac sodium (VOLTAREN) 1 % GEL Apply 1  application topically 4 (four) times daily.  100 g  0  . Ferrous Sulfate (IRON) 325 (65 FE) MG TABS Take 1 tablet by mouth 2 (two) times daily.  90 each  3  . gabapentin (NEURONTIN) 300 MG capsule Take 2 capsules (600 mg total) by mouth 3 (three) times daily.  150 capsule  5  . Multiple Vitamins-Minerals (CENTRUM SILVER PO) Take by mouth daily.        . pantoprazole (PROTONIX) 40 MG tablet Take 1 tablet (40 mg total) by mouth daily.  30 tablet  11  . sildenafil (VIAGRA) 100 MG tablet Take 1 tablet (100 mg total) by mouth daily as needed.  10 tablet  0  . [DISCONTINUED] atorvastatin (LIPITOR) 40 MG tablet TAKE 1 TABLET BY MOUTH DAILY.  90 tablet  1  . [DISCONTINUED] lisinopril-hydrochlorothiazide (PRINZIDE,ZESTORETIC) 20-12.5 MG per tablet Take 1 tablet by mouth daily.  30 tablet  5  . budesonide-formoterol (SYMBICORT) 160-4.5 MCG/ACT inhaler Inhale 2 puffs into the lungs 2 (two) times daily.  1 Inhaler  11   He  has no known allergies.  Review of Systems  Review of Systems  Constitutional: Negative for activity change, appetite change and fatigue.  HENT: Negative for hearing loss, congestion, tinnitus and ear discharge.   Eyes: Negative for visual disturbance (see optho q1y -- vision corrected to 20/20 with glasses).  Respiratory: Negative for cough, chest tightness and shortness of breath.   Cardiovascular: Negative for chest pain, palpitations and leg swelling.  Gastrointestinal: Negative for abdominal pain, diarrhea, constipation and abdominal distention.  Genitourinary: Negative for urgency, frequency, decreased urine volume and difficulty urinating.  Musculoskeletal: Negative for back pain, arthralgias and gait problem.  Skin: Negative for color change, pallor and rash.  Neurological: Negative for dizziness, light-headedness, numbness and headaches.    Hematological: Negative for adenopathy. Does not bruise/bleed easily.  Psychiatric/Behavioral: Negative for suicidal ideas, confusion, sleep disturbance, self-injury, dysphoric mood, decreased concentration and agitation.  Pt is able to read and write and can do all ADLs No risk for falling No abuse/ violence in home      Objective:     Vision by Snellen chart: opth ,Blood pressure 140/60, pulse 95, temperature 97.8 F (36.6 C), temperature source Oral, height 5' 2.5" (1.588 m), weight 136 lb 12.8 oz (62.052 kg), SpO2 95.00%. Body mass index is 24.62 kg/(m^2).  BP 140/60  Pulse 95  Temp 97.8 F (36.6 C) (Oral)  Ht 5' 2.5" (1.588 m)  Wt 136 lb 12.8 oz (62.052 kg)  BMI 24.62 kg/m2  SpO2 95% General appearance: alert, cooperative, appears stated age and no distress Head: Normocephalic, without obvious abnormality, atraumatic Eyes: conjunctivae/corneas clear. PERRL, EOM's intact. Fundi benign. Ears: normal TM's and external ear canals both ears Nose: Nares normal. Septum midline. Mucosa normal. No drainage or sinus tenderness. Throat: lips, mucosa, and tongue normal; teeth and gums normal Neck: no adenopathy, no JVD, supple, symmetrical, trachea midline, thyroid not enlarged, symmetric, no tenderness/mass/nodules and b/l carotid bruits Back: symmetric, no curvature. ROM normal. No CVA tenderness. Lungs: clear to auscultation bilaterally Chest wall: no tenderness Heart: regular rate and rhythm, S1, S2 normal, no murmur, click, rub or gallop Abdomen: soft, non-tender; bowel sounds normal; no masses,  no organomegaly Male genitalia: normal Rectal: normal tone, normal prostate, no masses or tenderness and guiac + stool Extremities: extremities normal, atraumatic, no cyanosis or edema Pulses: 2+ and symmetric Skin: Skin color, texture, turgor normal. No rashes or lesions Lymph nodes: Cervical, supraclavicular, and axillary  nodes normal. Neurologic: Alert and oriented X 3, normal  strength and tone. Normal symmetric reflexes. Normal coordination and gait Psych-- no depression, no anxiety     Assessment:     cpe     Plan:     During the course of the visit the patient was educated and counseled about appropriate screening and preventive services including:     Diet review for nutrition referral? Yes ____  Not Indicated _x___   Patient Instructions (the written plan) was given to the patient.  Medicare Attestation I have personally reviewed: The patient's medical and social history Their use of alcohol, tobacco or illicit drugs Their current medications and supplements The patient's functional ability including ADLs,fall risks, home safety risks, cognitive, and hearing and visual impairment Diet and physical activities Evidence for depression or mood disorders  The patient's weight, height, BMI, and visual acuity have been recorded in the chart.  I have made referrals, counseling, and provided education to the patient based on review of the above and I have provided the patient with a written personalized care plan for preventive services.     Loreen Freud, DO   02/12/2012

## 2012-02-12 NOTE — Assessment & Plan Note (Signed)
Carotid US due in Dec F/u Dr Arbie Cookey

## 2012-02-12 NOTE — Assessment & Plan Note (Signed)
Change to symbicort secondary to ins  pfts today

## 2012-02-12 NOTE — Assessment & Plan Note (Signed)
Stable Stop smoking con't meds

## 2012-02-12 NOTE — Assessment & Plan Note (Signed)
con't iron  F/u GI prn

## 2012-02-23 ENCOUNTER — Telehealth: Payer: Self-pay

## 2012-02-23 NOTE — Telephone Encounter (Signed)
Ronald Bell, let the patient know that I reviewed his hemoglobin levels AS HE REQUESTED. No changes, stay on iron daily indefinitely. His doctor is going to recheck his blood counts in 6 months. I agree. Convert this to a phone note. Thanks ----- Message ----- From: Ronald Bell, Ronald Bell Sent: 02/22/2012 4:14 PM To: Ronald Fredrickson, MD Patient would like Dr.[facility] to call and follow up on his Hemoglobin levels.    Spoke with pt and he is aware.

## 2012-03-02 ENCOUNTER — Other Ambulatory Visit: Payer: Self-pay | Admitting: Family Medicine

## 2012-03-07 ENCOUNTER — Telehealth: Payer: Self-pay | Admitting: Family Medicine

## 2012-03-07 MED ORDER — HYDROCODONE-ACETAMINOPHEN 10-325 MG PO TABS: 1.0000 | ORAL_TABLET | Freq: Three times a day (TID) | ORAL | Status: DC | PRN

## 2012-03-07 NOTE — Telephone Encounter (Signed)
Last seen and filled 02/12/12. Please advise      KP

## 2012-03-07 NOTE — Telephone Encounter (Signed)
Refill x1,  Need agreement signed and urine

## 2012-03-07 NOTE — Telephone Encounter (Signed)
Refill: Hydrocodone/apap 10-325 mg tab. Take 1 tablet by mouth every 8 hours as needed for pain. Qty 30. Last fill 02-12-12

## 2012-03-07 NOTE — Telephone Encounter (Signed)
Patient aware that he will need to come in and sign agreement.     KP

## 2012-03-30 ENCOUNTER — Telehealth: Payer: Self-pay | Admitting: Family Medicine

## 2012-03-30 NOTE — Telephone Encounter (Signed)
Last seen 02/12/12 and filled 03/07/12 # 30. Please advise     KP

## 2012-03-30 NOTE — Telephone Encounter (Signed)
Refill: Hydrocodone/apap 10-325mg . Take 1 tablet by mouth every 8 hours as needed for pain. Qty 30. Last fill 02-12-12

## 2012-03-31 MED ORDER — HYDROCODONE-ACETAMINOPHEN 10-325 MG PO TABS: 1.0000 | ORAL_TABLET | Freq: Three times a day (TID) | ORAL | Status: DC | PRN

## 2012-03-31 NOTE — Telephone Encounter (Signed)
Refill x1 

## 2012-03-31 NOTE — Telephone Encounter (Signed)
Rx faxed.    KP 

## 2012-04-08 ENCOUNTER — Encounter (INDEPENDENT_AMBULATORY_CARE_PROVIDER_SITE_OTHER): Payer: PRIVATE HEALTH INSURANCE

## 2012-04-08 DIAGNOSIS — I6529 Occlusion and stenosis of unspecified carotid artery: Secondary | ICD-10-CM

## 2012-04-13 ENCOUNTER — Other Ambulatory Visit: Payer: Self-pay | Admitting: Family Medicine

## 2012-04-13 DIAGNOSIS — I6529 Occlusion and stenosis of unspecified carotid artery: Secondary | ICD-10-CM

## 2012-04-15 ENCOUNTER — Other Ambulatory Visit: Payer: Self-pay | Admitting: *Deleted

## 2012-04-15 DIAGNOSIS — I6529 Occlusion and stenosis of unspecified carotid artery: Secondary | ICD-10-CM

## 2012-04-21 ENCOUNTER — Encounter: Payer: Self-pay | Admitting: Vascular Surgery

## 2012-04-22 ENCOUNTER — Other Ambulatory Visit: Payer: PRIVATE HEALTH INSURANCE

## 2012-04-22 ENCOUNTER — Ambulatory Visit (INDEPENDENT_AMBULATORY_CARE_PROVIDER_SITE_OTHER): Payer: PRIVATE HEALTH INSURANCE | Admitting: Vascular Surgery

## 2012-04-22 ENCOUNTER — Encounter: Payer: PRIVATE HEALTH INSURANCE | Admitting: Vascular Surgery

## 2012-04-22 ENCOUNTER — Other Ambulatory Visit (INDEPENDENT_AMBULATORY_CARE_PROVIDER_SITE_OTHER): Payer: PRIVATE HEALTH INSURANCE | Admitting: *Deleted

## 2012-04-22 ENCOUNTER — Encounter: Payer: Self-pay | Admitting: Vascular Surgery

## 2012-04-22 ENCOUNTER — Other Ambulatory Visit: Payer: Self-pay | Admitting: *Deleted

## 2012-04-22 VITALS — BP 64/42 | HR 83 | Ht 62.5 in | Wt 134.0 lb

## 2012-04-22 DIAGNOSIS — I6529 Occlusion and stenosis of unspecified carotid artery: Secondary | ICD-10-CM | POA: Diagnosis not present

## 2012-04-22 DIAGNOSIS — Z48812 Encounter for surgical aftercare following surgery on the circulatory system: Secondary | ICD-10-CM

## 2012-04-22 DIAGNOSIS — I739 Peripheral vascular disease, unspecified: Secondary | ICD-10-CM

## 2012-04-22 HISTORY — DX: Occlusion and stenosis of unspecified carotid artery: I65.29

## 2012-04-22 NOTE — Progress Notes (Signed)
[facility] [place]  New Carotid Patient  Referred by:  Lelon Perla, DO [phone] [facility] [address] [place], [place] [address]  Reason for referral: L ICA carotid stenosis  History of Present Illness  Ronald Bell is a 72 y.o. (08-Apr-1940) male who presents with chief complaint: abnormal carotid study.  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  Recent duplex demonstrated: RICA 40-59% stenosis, LICA 60-79% stenosis.  Patient has no history of TIA or stroke symptom.  The patient has never had amaurosis fugax or monocular blindness.  The patient has never had facial drooping or hemiplegia.  The patient has never had receptive or expressive aphasia.   The patient's risks factors for carotid disease include: hyperlipidemia, smoking, and hypertension.  Pt actively smokes currently.  Also patient is computer program with minimal intermittent left hand numbness.  He denies any problems completing his ADL with his left hand.  Past Medical History  Diagnosis Date  . Other symptoms involving cardiovascular system   . Polyp of nasal cavity   . Other specified disorders of arteries and arterioles   . Cervicalgia   . Routine general medical examination at a health care facility   . Rosacea   . Benign neoplasm of colon   . Personal history of tobacco use, presenting hazards to health   . Personal history of other diseases of digestive system   . Psychosexual dysfunction with inhibited sexual excitement   . Chronic airway obstruction, not elsewhere classified   . Unspecified essential hypertension   . Other and unspecified hyperlipidemia   . GERD (gastroesophageal reflux disease)   . Anemia   . Barrett esophagus   . Arthritis   . Blood transfusion 2010    4 units  . Adenomatous colon polyp   . Peripheral vascular disease   . Irregular heartbeat   . Shingles     Past Surgical History  Procedure Date  .  Colon surgery   . Iliac artery stent     History   Social History  . Marital Status: Married    Spouse Name: N/A    Number of Children: 4  . Years of Education: N/A   Occupational History  . Nurse, adult    Social History Main Topics  . Smoking status: Current Every Day Smoker -- 1.0 packs/day for 55 years    Types: Cigarettes  . Smokeless tobacco: Never Used     Comment: pt states he knows he needs to quit  . Alcohol Use: No  . Drug Use: No  . Sexually Active: Yes -- Male partner(s)   Other Topics Concern  . Not on file   Social History Narrative  . No narrative on file    Family History  Problem Relation Age of Onset  . Lung disease Mother     pulm fibrosis  . Uterine cancer    . Colitis Father   . Colon cancer Neg Hx   . Heart disease Brother   . Hypertension Brother   . Heart disease Daughter     cad    Current Outpatient Prescriptions on File Prior to Visit  Medication Sig Dispense Refill  . amLODipine (NORVASC) 10 MG tablet TAKE 1 TABLET (10 MG TOTAL) BY MOUTH DAILY.  30 tablet  5  . Ascorbic Acid (VITAMIN C) 250 MG tablet Take 500 mg by mouth daily.       Marland Kitchen aspirin 81 MG EC tablet  Take 81 mg by mouth daily.        Marland Kitchen atorvastatin (LIPITOR) 40 MG tablet 1 po qhs  90 tablet  3  . budesonide-formoterol (SYMBICORT) 160-4.5 MCG/ACT inhaler Inhale 2 puffs into the lungs 2 (two) times daily.  1 Inhaler  11  . Ferrous Sulfate (IRON) 325 (65 FE) MG TABS Take 1 tablet by mouth 2 (two) times daily.  90 each  3  . gabapentin (NEURONTIN) 300 MG capsule Take 2 capsules (600 mg total) by mouth 3 (three) times daily.  150 capsule  5  . HYDROcodone-acetaminophen (NORCO) 10-325 MG per tablet Take 1 tablet by mouth every 8 (eight) hours as needed for pain.  30 tablet  0  . lisinopril-hydrochlorothiazide (PRINZIDE,ZESTORETIC) 20-12.5 MG per tablet TAKE 1 TABLET BY MOUTH DAILY.  30 tablet  5  . Multiple Vitamins-Minerals (CENTRUM SILVER PO) Take by mouth daily.         . pantoprazole (PROTONIX) 40 MG tablet Take 1 tablet (40 mg total) by mouth daily.  30 tablet  11  . COMBIVENT RESPIMAT 20-100 MCG/ACT AERS respimat INHALE 1 PUFF BY MOUTH INTO THE LUNGS FOUR TIMES A DAY IF NEEDED.  1 Inhaler  1  . diclofenac sodium (VOLTAREN) 1 % GEL Apply 1 application topically 4 (four) times daily.  100 g  0  . sildenafil (VIAGRA) 100 MG tablet Take 1 tablet (100 mg total) by mouth daily as needed.  10 tablet  0    No Known Allergies  REVIEW OF SYSTEMS:  (Positives checked otherwise negative)  CARDIOVASCULAR:  [ ]  chest pain, [ ]  chest pressure, [ ]  palpitations, [ ]  shortness of breath when laying flat, [x]  shortness of breath with exertion,   [ ]  pain in feet when walking, [ ]  pain in feet when laying flat, [ ]  history of blood clot in veins (DVT), [ ]  history of phlebitis, [ ]  swelling in legs, [ ]  varicose veins  PULMONARY:  [ ]  productive cough, [ ]  asthma, [ ]  wheezing  NEUROLOGIC:  [ ]  weakness in arms or legs, [x]  numbness in arms or legs, [ ]  difficulty speaking or slurred speech, [ ]  temporary loss of vision in one eye, [ ]  dizziness  HEMATOLOGIC:  [ ]  bleeding problems, [ ]  problems with blood clotting too easily  MUSCULOSKEL:  [ ]  joint pain, [ ]  joint swelling  GASTROINTEST:  [ ]   Vomiting blood, [x]   Blood in stool     GENITOURINARY:  [ ]   Burning with urination, [ ]   Blood in urine  PSYCHIATRIC:  [ ]  history of major depression  INTEGUMENTARY:  [ ]  rashes, [ ]  ulcers  CONSTITUTIONAL:  [ ]  fever, [ ]  chills  Physical Examination  Filed Vitals:   04/22/12 0856 04/22/12 0859  BP: 125/51 64/42  Pulse: 83   Height: 5' 2.5" (1.588 m)   Weight: 134 lb (60.782 kg)   SpO2: 99%    Body mass index is 24.12 kg/(m^2).  General: A&O x 3, WDWN  Head: /AT  Ear/Nose/Throat: Hearing grossly intact, nares w/o erythema or drainage, oropharynx w/o Erythema/Exudate  Eyes: PERRLA, EOMI  Neck: Supple, no nuchal rigidity, no palpable  LAD  Pulmonary: Sym exp, good air movt, CTAB, no rales, rhonchi, & wheezing  Cardiac: RRR, Nl S1, S2, no rubs or gallops, + SEM  Vascular: Vessel Right Left  Radial Palpable Not Palpable  Ulnar Faintly Palpable Not Palpable  Brachial Palpable Not Palpable  Carotid Palpable, without bruit  Palpable, without bruit  Aorta Not palpable N/A  Femoral Palpable Palpable  Popliteal Not palpable Not palpable  PT Palpable Not Palpable  DP Palpable Faintly Palpable   Gastrointestinal: soft, NTND, -G/R, - HSM, - masses, - CVAT B  Musculoskeletal: M/S 5/5 throughout , Extremities without ischemic changes   Neurologic: CN 2-12 intact , Pain and light touch intact in extremities , Motor exam as listed above  Psychiatric: Judgment intact, Mood & affect appropriate for pt's clinical situation  Dermatologic: See M/S exam for extremity exam, no rashes otherwise noted  Lymph : No Cervical, Axillary, or Inguinal lymphadenopathy   Non-Invasive Vascular Imaging  L CAROTID DUPLEX (Date: 04/22/12):   L ICA stenosis: 40-59%  L VA: not visualized  Outside Studies/Documentation 5 pages of outside documents were reviewed including: outside clinic chart and carotid duplex.  Medical Decision Making  ARTEMIS LOYAL is a 72 y.o. male who presents with: asx R ICA stenosis 40-59%, asx L ICA stenosis <80%, likely L SCA stenosis vs occlusion   Based on the patient's vascular studies and examination, I have offered the patient: continued 6 month surveillance with B carotid duplex   There is no clinically significant advantage to L CEA in an asx pt with stenosis <80%.     I had recommended considering a CTA Neck to evaluate the great vessels coming off the aortic arch but the patient is not interest in such at this time.  I don't think intervention on the L SCA is needed as he has minimal sx and he has no difficulties completing ADL.  I discussed in depth with the patient the nature of  atherosclerosis, and emphasized the importance of maximal medical management including strict control of blood pressure, blood glucose, and lipid levels, obtaining regular exercise, antiplatelet agents, and cessation of smoking.    I emphasized smoking cessation, and he is going to try to quit on his own.  This patient has previously seen Dr. Arbie Cookey and would like to resume his care with him, so we will arrange this for him.  Additional to the B carotid duplex, he needs an aortoiliac duplex and ABI at his next visit to interrogate the L CIA stent.  The patient is aware that without maximal medical management the underlying atherosclerotic disease process will progress, limiting the benefit of any interventions.  Thank you for allowing [place] to participate in this patient's care.  Leonides Sake, MD [facility] [place] Office: [phone] Pager: [phone]  04/22/2012, 9:24 AM

## 2012-04-29 ENCOUNTER — Other Ambulatory Visit: Payer: Self-pay | Admitting: Family Medicine

## 2012-04-29 MED ORDER — HYDROCODONE-ACETAMINOPHEN 10-325 MG PO TABS: 1.0000 | ORAL_TABLET | Freq: Three times a day (TID) | ORAL | Status: DC | PRN

## 2012-04-29 NOTE — Telephone Encounter (Signed)
Last seen 02/12/12 and filled 03/31/12 # 30. Please advise     KP

## 2012-04-29 NOTE — Telephone Encounter (Signed)
REFILL  Hydrocodone-Acetaminophen (Tab) 10-325 MG Take 1 tablet by mouth every 8 (eight) hours as needed for pain. #30 last fill 1.9.14

## 2012-04-29 NOTE — Telephone Encounter (Signed)
Refill #20.

## 2012-05-09 ENCOUNTER — Encounter: Payer: Self-pay | Admitting: Family Medicine

## 2012-05-30 ENCOUNTER — Telehealth: Payer: Self-pay

## 2012-05-30 MED ORDER — HYDROCODONE-ACETAMINOPHEN 10-325 MG PO TABS: 1.0000 | ORAL_TABLET | Freq: Three times a day (TID) | ORAL | Status: DC | PRN

## 2012-05-30 NOTE — Telephone Encounter (Signed)
Last seen 02/12/12 and filled 04/29/12 # 20. Please advise     KP

## 2012-05-30 NOTE — Telephone Encounter (Signed)
Refill x1 

## 2012-06-24 ENCOUNTER — Other Ambulatory Visit: Payer: Self-pay | Admitting: Family Medicine

## 2012-06-27 ENCOUNTER — Telehealth: Payer: Self-pay | Admitting: Family Medicine

## 2012-06-27 MED ORDER — IPRATROPIUM-ALBUTEROL 20-100 MCG/ACT IN AERS: 1.0000 | INHALATION_SPRAY | Freq: Four times a day (QID) | RESPIRATORY_TRACT | Status: DC | PRN

## 2012-06-27 NOTE — Telephone Encounter (Signed)
Refill: Combivent repimat inhaler. Inhale 1 puff by mouth into the lungs four times a day if needed. Last fill 05-26-12

## 2012-06-28 ENCOUNTER — Telehealth: Payer: Self-pay | Admitting: Family Medicine

## 2012-06-28 MED ORDER — HYDROCODONE-ACETAMINOPHEN 10-325 MG PO TABS: 1.0000 | ORAL_TABLET | Freq: Three times a day (TID) | ORAL | Status: DC | PRN

## 2012-06-28 NOTE — Telephone Encounter (Signed)
Refill: Hydrocodone/apap 10/325 tab. Take 1 tablet by mouth every 8 hours as needed for pain. Qty 20. Last fill 06-02-12

## 2012-06-28 NOTE — Telephone Encounter (Signed)
Last seen 02/12/12 and filled 06/02/12 #20. Please advise    KP

## 2012-06-28 NOTE — Telephone Encounter (Signed)
Refill x1 

## 2012-06-30 ENCOUNTER — Telehealth: Payer: Self-pay | Admitting: Family Medicine

## 2012-06-30 NOTE — Telephone Encounter (Signed)
URI concerns and he wants and ABX. Apt scheduled for tomorrow     KP

## 2012-06-30 NOTE — Telephone Encounter (Signed)
Patient is requesting to SW Kim about chest congestion. The mucinex he is taking isn't working.

## 2012-07-01 ENCOUNTER — Ambulatory Visit (INDEPENDENT_AMBULATORY_CARE_PROVIDER_SITE_OTHER): Payer: PRIVATE HEALTH INSURANCE | Admitting: Family Medicine

## 2012-07-01 ENCOUNTER — Encounter: Payer: Self-pay | Admitting: Family Medicine

## 2012-07-01 VITALS — BP 118/60 | HR 94 | Temp 98.3°F | Wt 128.2 lb

## 2012-07-01 DIAGNOSIS — J209 Acute bronchitis, unspecified: Secondary | ICD-10-CM

## 2012-07-01 MED ORDER — AZITHROMYCIN 250 MG PO TABS: ORAL_TABLET | ORAL | Status: DC

## 2012-07-01 MED ORDER — PREDNISONE 10 MG PO TABS: ORAL_TABLET | ORAL | Status: DC

## 2012-07-01 MED ORDER — GUAIFENESIN-CODEINE 100-10 MG/5ML PO SYRP: ORAL_SOLUTION | ORAL | Status: DC

## 2012-07-01 NOTE — Patient Instructions (Signed)

## 2012-07-01 NOTE — Progress Notes (Signed)
  Subjective:     Ronald Bell is a 72 y.o. male here for evaluation of a cough. Onset of symptoms was 1 week ago. Symptoms have been gradually worsening since that time. The cough is productive and is aggravated by cold air, infection, pollens and reclining position. Associated symptoms include: shortness of breath and sputum production. Patient does have a history of copd. Patient does have a history of environmental allergens. Patient has not traveled recently. Patient does have a history of smoking. Patient has not had a previous chest x-ray. Patient has not had a PPD done.  The following portions of the patient's history were reviewed and updated as appropriate: allergies, current medications, past family history, past medical history, past social history, past surgical history and problem list.  Review of Systems Pertinent items are noted in HPI.    Objective:    Oxygen saturation 96% on room air BP 118/60  Pulse 94  Temp(Src) 98.3 F (36.8 C) (Oral)  Wt 128 lb 3.2 oz (58.151 kg)  BMI 23.06 kg/m2  SpO2 96% General appearance: alert, cooperative, appears stated age and no distress Ears: normal TM's and external ear canals both ears Nose: Nares normal. Septum midline. Mucosa normal. No drainage or sinus tenderness. Throat: lips, mucosa, and tongue normal; teeth and gums normal Neck: no adenopathy, supple, symmetrical, trachea midline and thyroid not enlarged, symmetric, no tenderness/mass/nodules Lungs: rhonchi anterior - bilateral Heart: S1, S2 normal    Assessment:    Acute Bronchitis    Plan:    Antibiotics per medication orders. Antitussives per medication orders. Avoid exposure to tobacco smoke and fumes. B-agonist inhaler. Call if shortness of breath worsens, blood in sputum, change in character of cough, development of fever or chills, inability to maintain nutrition and hydration. Avoid exposure to tobacco smoke and fumes. Steroid inhaler as ordered. Smoking  cessation.

## 2012-07-29 ENCOUNTER — Encounter: Payer: Self-pay | Admitting: Family Medicine

## 2012-07-29 ENCOUNTER — Ambulatory Visit (INDEPENDENT_AMBULATORY_CARE_PROVIDER_SITE_OTHER): Payer: PRIVATE HEALTH INSURANCE | Admitting: Family Medicine

## 2012-07-29 VITALS — BP 120/60 | HR 100 | Temp 98.4°F | Wt 127.4 lb

## 2012-07-29 DIAGNOSIS — F17200 Nicotine dependence, unspecified, uncomplicated: Secondary | ICD-10-CM

## 2012-07-29 DIAGNOSIS — J209 Acute bronchitis, unspecified: Secondary | ICD-10-CM

## 2012-07-29 DIAGNOSIS — J441 Chronic obstructive pulmonary disease with (acute) exacerbation: Secondary | ICD-10-CM

## 2012-07-29 MED ORDER — FLUTICASONE-SALMETEROL 250-50 MCG/DOSE IN AEPB
1.0000 | INHALATION_SPRAY | Freq: Two times a day (BID) | RESPIRATORY_TRACT | Status: DC
Start: 2012-07-29 — End: 2012-08-12

## 2012-07-29 MED ORDER — IPRATROPIUM BROMIDE 0.02 % IN SOLN
0.5000 mg | Freq: Once | RESPIRATORY_TRACT | Status: AC
  Administered 2012-07-29: 0.5 mg via RESPIRATORY_TRACT

## 2012-07-29 MED ORDER — AZITHROMYCIN 250 MG PO TABS: ORAL_TABLET | ORAL | Status: DC

## 2012-07-29 MED ORDER — PREDNISONE 10 MG PO TABS: ORAL_TABLET | ORAL | Status: DC

## 2012-07-29 MED ORDER — VARENICLINE TARTRATE 0.5 MG X 11 & 1 MG X 42 PO MISC: ORAL | Status: DC

## 2012-07-29 MED ORDER — ALBUTEROL SULFATE (5 MG/ML) 0.5% IN NEBU
2.5000 mg | INHALATION_SOLUTION | Freq: Once | RESPIRATORY_TRACT | Status: AC
  Administered 2012-07-29: 2.5 mg via RESPIRATORY_TRACT

## 2012-07-29 NOTE — Patient Instructions (Signed)

## 2012-07-31 ENCOUNTER — Encounter: Payer: Self-pay | Admitting: Family Medicine

## 2012-07-31 NOTE — Progress Notes (Signed)
  Subjective:     Ronald Bell is a 72 y.o. male here for evaluation of a cough. Onset of symptoms was several days ago.  He improved after last visit and then got sick again.   Symptoms have been gradually worsening since that time. The cough is productive and is aggravated by exercise, infection and reclining position. Associated symptoms include: postnasal drip, shortness of breath, sputum production and wheezing. Patient does not have a history of asthma but does have copd. Patient does not have a history of environmental allergens. Patient has not traveled recently. Patient does have a history of smoking. Patient has had a previous chest x-ray. Patient has not had a PPD done.  The following portions of the patient's history were reviewed and updated as appropriate: allergies, current medications, past family history, past medical history, past social history, past surgical history and problem list.  Review of Systems Pertinent items are noted in HPI.    Objective:    Oxygen saturation 96% on room air BP 120/60  Pulse 100  Temp(Src) 98.4 F (36.9 C) (Oral)  Wt 127 lb 6.4 oz (57.788 kg)  BMI 22.92 kg/m2  SpO2 96% General appearance: alert, cooperative, appears stated age and no distress Ears: normal TM's and external ear canals both ears Nose: green discharge, mild congestion, turbinates swollen, sinus tenderness bilateral Throat: abnormal findings: mild oropharyngeal erythema and pnd Neck: mild anterior cervical adenopathy, supple, symmetrical, trachea midline and thyroid not enlarged, symmetric, no tenderness/mass/nodules Lungs: clear to auscultation bilaterally Heart: S1, S2 normal    Assessment:    Acute Bronchitis and COPD with exacerbation    Plan:    Antibiotics per medication orders. Antitussives per medication orders. Avoid exposure to tobacco smoke and fumes. B-agonist inhaler. Call if shortness of breath worsens, blood in sputum, change in character of cough,  development of fever or chills, inability to maintain nutrition and hydration. Avoid exposure to tobacco smoke and fumes. Steroid inhaler as ordered. Smoking cessation. see AVS and meds and orders

## 2012-08-01 ENCOUNTER — Telehealth: Payer: Self-pay | Admitting: *Deleted

## 2012-08-01 NOTE — Telephone Encounter (Signed)
Left message to call office to advise Pt insurance will not cover the advair any more will place sample of new med up front for him to try.

## 2012-08-01 NOTE — Telephone Encounter (Signed)
PT must have tried and failed the following med: (symbicort, spriva, tudorza) before advair can be approved..Please advise

## 2012-08-01 NOTE — Telephone Encounter (Signed)
He was on symbicort

## 2012-08-01 NOTE — Telephone Encounter (Signed)
tudorza 1 puff bid----- he can try a sample

## 2012-08-01 NOTE — Telephone Encounter (Signed)
Per insurance Pt would have to fail at least two of the list med in order to approved advair.Please advise

## 2012-08-02 MED ORDER — ACLIDINIUM BROMIDE 400 MCG/ACT IN AEPB: 1.0000 | INHALATION_SPRAY | Freq: Two times a day (BID) | RESPIRATORY_TRACT | Status: DC

## 2012-08-02 NOTE — Telephone Encounter (Signed)
Discuss with patient sample placed up front for pick up.

## 2012-08-03 ENCOUNTER — Other Ambulatory Visit: Payer: Self-pay | Admitting: Family Medicine

## 2012-08-03 NOTE — Telephone Encounter (Signed)
Last seen 07/29/12 and filled 07/01/12. Please advise     KP

## 2012-08-12 ENCOUNTER — Encounter: Payer: Self-pay | Admitting: Family Medicine

## 2012-08-12 ENCOUNTER — Ambulatory Visit (INDEPENDENT_AMBULATORY_CARE_PROVIDER_SITE_OTHER): Payer: PRIVATE HEALTH INSURANCE | Admitting: Family Medicine

## 2012-08-12 VITALS — BP 132/60 | HR 103 | Temp 98.1°F | Wt 128.0 lb

## 2012-08-12 DIAGNOSIS — I10 Essential (primary) hypertension: Secondary | ICD-10-CM | POA: Diagnosis not present

## 2012-08-12 DIAGNOSIS — E785 Hyperlipidemia, unspecified: Secondary | ICD-10-CM

## 2012-08-12 DIAGNOSIS — J449 Chronic obstructive pulmonary disease, unspecified: Secondary | ICD-10-CM

## 2012-08-12 DIAGNOSIS — Z87891 Personal history of nicotine dependence: Secondary | ICD-10-CM | POA: Diagnosis not present

## 2012-08-12 LAB — BASIC METABOLIC PANEL
GFR: 119.13 mL/min (ref >60.00)
Glucose, Bld: 96 mg/dL (ref 70–99)
Potassium: 4.3 mEq/L (ref 3.5–5.1)
Sodium: 131 mEq/L — ABNORMAL LOW (ref 135–145)

## 2012-08-12 LAB — HEPATIC FUNCTION PANEL
AST: 27 U/L (ref 0–37)
Albumin: 3.8 g/dL (ref 3.5–5.2)
Alkaline Phosphatase: 49 U/L (ref 39–117)
Bilirubin, Direct: 0 mg/dL (ref 0.0–0.3)
Total Bilirubin: 0.3 mg/dL (ref 0.3–1.2)

## 2012-08-12 LAB — LIPID PANEL
LDL Cholesterol: 64 mg/dL (ref 0–99)
Total CHOL/HDL Ratio: 3
VLDL: 23.6 mg/dL (ref 0.0–40.0)

## 2012-08-12 MED ORDER — ACLIDINIUM BROMIDE 400 MCG/ACT IN AEPB: 1.0000 | INHALATION_SPRAY | Freq: Two times a day (BID) | RESPIRATORY_TRACT | Status: DC

## 2012-08-12 NOTE — Patient Instructions (Signed)

## 2012-08-12 NOTE — Assessment & Plan Note (Signed)
Cont tudorza

## 2012-08-12 NOTE — Assessment & Plan Note (Signed)
Pt will be starting chantix

## 2012-08-12 NOTE — Assessment & Plan Note (Signed)
Check labs con't meds 

## 2012-08-12 NOTE — Progress Notes (Signed)
  Subjective:    Patient here for follow-up of elevated blood pressure.  He is not exercising and is adherent to a low-salt diet.  Blood pressure is well controlled at home. Cardiac symptoms: none. Patient denies: chest pain, chest pressure/discomfort, claudication, exertional chest pressure/discomfort, fatigue, irregular heart beat, lower extremity edema, near-syncope, orthopnea, palpitations, paroxysmal nocturnal dyspnea, syncope and tachypnea. Cardiovascular risk factors: advanced age (older than 96 for men, 26 for women), dyslipidemia, hypertension, male gender, sedentary lifestyle and smoking/ tobacco exposure. Use of agents associated with hypertension: none. History of target organ damage: none. Pt is also here labs and f/u from copd exacerbation.  Pt is doing much better  The following portions of the patient's history were reviewed and updated as appropriate: allergies, current medications, past family history, past medical history, past social history, past surgical history and problem list.  Review of Systems Pertinent items are noted in HPI.     Objective:    BP 132/60  Pulse 103  Temp(Src) 98.1 F (36.7 C) (Oral)  Wt 128 lb (58.06 kg)  BMI 23.02 kg/m2  SpO2 95% General appearance: alert, cooperative, appears stated age and no distress Nose: Nares normal. Septum midline. Mucosa normal. No drainage or sinus tenderness. Throat: lips, mucosa, and tongue normal; teeth and gums normal Neck: no adenopathy, no carotid bruit, no JVD, supple, symmetrical, trachea midline and thyroid not enlarged, symmetric, no tenderness/mass/nodules Lungs: clear to auscultation bilaterally Heart: S1, S2 normal and + murmur Extremities: extremities normal, atraumatic, no cyanosis or edema    Assessment:    Hypertension, normal blood pressure . Evidence of target organ damage: none.    Plan:    Medication: no change. Dietary sodium restriction. Regular aerobic exercise. Check blood pressures  2-3 times weekly and record. Follow up: 6 months and as needed.

## 2012-08-17 ENCOUNTER — Other Ambulatory Visit: Payer: Self-pay | Admitting: General Practice

## 2012-08-17 DIAGNOSIS — J449 Chronic obstructive pulmonary disease, unspecified: Secondary | ICD-10-CM

## 2012-08-17 MED ORDER — ACLIDINIUM BROMIDE 400 MCG/ACT IN AEPB: 1.0000 | INHALATION_SPRAY | Freq: Two times a day (BID) | RESPIRATORY_TRACT | Status: DC

## 2012-08-19 ENCOUNTER — Other Ambulatory Visit: Payer: Self-pay | Admitting: General Practice

## 2012-08-19 DIAGNOSIS — J449 Chronic obstructive pulmonary disease, unspecified: Secondary | ICD-10-CM

## 2012-08-19 MED ORDER — ACLIDINIUM BROMIDE 400 MCG/ACT IN AEPB: 1.0000 | INHALATION_SPRAY | Freq: Two times a day (BID) | RESPIRATORY_TRACT | Status: DC

## 2012-08-19 NOTE — Telephone Encounter (Signed)
Pharmacy stated they never received first refill. Called in with them today.

## 2012-09-14 ENCOUNTER — Other Ambulatory Visit: Payer: Self-pay | Admitting: Family Medicine

## 2012-10-17 ENCOUNTER — Encounter: Payer: Self-pay | Admitting: Vascular Surgery

## 2012-10-17 ENCOUNTER — Other Ambulatory Visit: Payer: Self-pay | Admitting: *Deleted

## 2012-10-17 MED ORDER — ATORVASTATIN CALCIUM 40 MG PO TABS: 40.0000 mg | ORAL_TABLET | Freq: Every day | ORAL | Status: DC

## 2012-10-17 NOTE — Telephone Encounter (Signed)
Rx has been filled 90 day supply with 1 refill of Atorvastatin 40 mg.

## 2012-10-18 ENCOUNTER — Ambulatory Visit: Payer: PRIVATE HEALTH INSURANCE | Admitting: Vascular Surgery

## 2012-10-18 ENCOUNTER — Other Ambulatory Visit (INDEPENDENT_AMBULATORY_CARE_PROVIDER_SITE_OTHER): Payer: Medicare Other | Admitting: *Deleted

## 2012-10-18 ENCOUNTER — Encounter: Payer: Self-pay | Admitting: Vascular Surgery

## 2012-10-18 ENCOUNTER — Encounter (INDEPENDENT_AMBULATORY_CARE_PROVIDER_SITE_OTHER): Payer: Medicare Other | Admitting: *Deleted

## 2012-10-18 ENCOUNTER — Ambulatory Visit (INDEPENDENT_AMBULATORY_CARE_PROVIDER_SITE_OTHER): Payer: Medicare Other | Admitting: Vascular Surgery

## 2012-10-18 ENCOUNTER — Other Ambulatory Visit: Payer: Medicare Other

## 2012-10-18 DIAGNOSIS — I739 Peripheral vascular disease, unspecified: Secondary | ICD-10-CM

## 2012-10-18 DIAGNOSIS — I6529 Occlusion and stenosis of unspecified carotid artery: Secondary | ICD-10-CM | POA: Diagnosis not present

## 2012-10-18 DIAGNOSIS — Z48812 Encounter for surgical aftercare following surgery on the circulatory system: Secondary | ICD-10-CM

## 2012-10-18 HISTORY — DX: Peripheral vascular disease, unspecified: I73.9

## 2012-10-18 NOTE — Progress Notes (Signed)
The patient has today for followup of his diffuse peripheral vascular occlusive disease. He has multiple areas of stenosis but actually looks quite good today and reports that he is walking without difficulty. He specifically denies any new neurologic deficits. He had undergone left common iliac artery angioplasty for severe limiting claudication by myself in 2005 he denies any claudication type symptoms currently.  Past Medical History  Diagnosis Date  . Other symptoms involving cardiovascular system   . Polyp of nasal cavity   . Other specified disorders of arteries and arterioles   . Cervicalgia   . Routine general medical examination at a health care facility   . Rosacea   . Benign neoplasm of colon   . Personal history of tobacco use, presenting hazards to health   . Personal history of other diseases of digestive system   . Psychosexual dysfunction with inhibited sexual excitement   . Chronic airway obstruction, not elsewhere classified   . Unspecified essential hypertension   . Other and unspecified hyperlipidemia   . GERD (gastroesophageal reflux disease)   . Anemia   . Barrett esophagus   . Arthritis   . Blood transfusion 2010    4 units  . Adenomatous colon polyp   . Peripheral vascular disease   . Irregular heartbeat   . Shingles   . Carotid artery occlusion     History  Substance Use Topics  . Smoking status: Current Every Day Smoker -- 0.75 packs/day for 55 years    Types: Cigarettes  . Smokeless tobacco: Never Used     Comment: pt states he knows he needs to quit  . Alcohol Use: No    Family History  Problem Relation Age of Onset  . Lung disease Mother     pulm fibrosis  . Uterine cancer    . Colitis Father   . Colon cancer Neg Hx   . Heart disease Brother   . Hypertension Brother   . Heart disease Daughter     cad  . Hypertension Son     No Known Allergies  Current outpatient prescriptions:Aclidinium Bromide (TUDORZA PRESSAIR) 400 MCG/ACT AEPB,  Inhale 1 puff into the lungs 2 (two) times daily., Disp: 1 each, Rfl: 5;  amLODipine (NORVASC) 10 MG tablet, TAKE 1 TABLET (10 MG TOTAL) BY MOUTH DAILY., Disp: 30 tablet, Rfl: 5;  Ascorbic Acid (VITAMIN C) 250 MG tablet, Take 500 mg by mouth daily. , Disp: , Rfl: ;  aspirin 81 MG EC tablet, Take 81 mg by mouth daily.  , Disp: , Rfl:  atorvastatin (LIPITOR) 40 MG tablet, Take 1 tablet (40 mg total) by mouth daily., Disp: 90 tablet, Rfl: 1;  DOCUSATE SODIUM PO, Take 1 tablet by mouth 2 (two) times daily. , Disp: , Rfl: ;  Ferrous Sulfate (IRON) 325 (65 FE) MG TABS, Take 1 tablet by mouth 2 (two) times daily., Disp: 90 each, Rfl: 3 Ipratropium-Albuterol (COMBIVENT RESPIMAT) 20-100 MCG/ACT AERS respimat, Inhale 1 puff into the lungs 4 (four) times daily as needed for wheezing., Disp: 1 Inhaler, Rfl: 3;  lisinopril-hydrochlorothiazide (PRINZIDE,ZESTORETIC) 20-12.5 MG per tablet, TAKE 1 TABLET BY MOUTH DAILY., Disp: 30 tablet, Rfl: 5;  Multiple Vitamins-Minerals (CENTRUM SILVER PO), Take by mouth daily.  , Disp: , Rfl:  varenicline (CHANTIX STARTING MONTH PAK) 0.5 MG X 11 & 1 MG X 42 tablet, Take one 0.5 mg tablet by mouth once daily for 3 days, then increase to one 0.5 mg tablet twice daily for 4 days, then increase  to one 1 mg tablet twice daily., Disp: 53 tablet, Rfl: 0;  pantoprazole (PROTONIX) 40 MG tablet, Take 1 tablet (40 mg total) by mouth daily., Disp: 30 tablet, Rfl: 11 sildenafil (VIAGRA) 100 MG tablet, Take 1 tablet (100 mg total) by mouth daily as needed., Disp: 10 tablet, Rfl: 0  BP 102/70  Pulse 67  Resp 16  Ht 5' 2.5" (1.588 m)  Wt 126 lb (57.153 kg)  BMI 22.66 kg/m2  SpO2 97%  Body mass index is 22.66 kg/(m^2).       Physical exam well-developed thin gentleman in no acute distress Neurologically he is grossly intact Pulse status he has a 2+ right radial and absent left radial pulse He has 2+ femoral and 2+ dorsalis pedis pulses bilaterally Chest is clear without wheezes Heart  regular rate and rhythm Abdomen soft nontender no masses noted  Noninvasive studies revealed the patency of his left common iliac artery stent. There is some elevated velocities at the proximal portion of this. He has normal ankle indices bilaterally triphasic waveforms bilaterally. He underwent repeat carotid duplex and that shows diffuse disease with no change from this prior studies. He does have moderate internal carotid artery stenosis bilaterally and left subclavian occlusion  Impression and plan diffuse peripheral vascular occlusive disease. He has no evidence of critical stenosis in his carotid arteries. He has no evidence of significant recurrent diseases iliac systems. He will continue his usual walking program. He'll notify should he develop any difficulties nonetheless we'll be seen again in one year with continued ultrasound surveillance

## 2012-10-25 ENCOUNTER — Other Ambulatory Visit: Payer: Self-pay | Admitting: *Deleted

## 2012-12-07 ENCOUNTER — Other Ambulatory Visit: Payer: Self-pay | Admitting: Family Medicine

## 2012-12-07 MED ORDER — VARENICLINE TARTRATE 1 MG PO TABS: 1.0000 mg | ORAL_TABLET | Freq: Two times a day (BID) | ORAL | Status: DC

## 2013-02-08 ENCOUNTER — Other Ambulatory Visit: Payer: Self-pay | Admitting: Family Medicine

## 2013-02-14 ENCOUNTER — Telehealth: Payer: Self-pay

## 2013-02-14 NOTE — Telephone Encounter (Signed)
Medication List and allergies: reviewed and updated  90 day supply/mail order: na Local prescriptions: [facility]  Immunizations due: admin flu vaccine upon arrival  A/P:   No changes to FH or PSH Tdap--last 2004--due--advised patient CCS--03/2011--Dr Perry--adenomatous polyps--due 03/2013 PSA--01/2012--1.25  To Discuss with Provider: Not at this time

## 2013-02-17 ENCOUNTER — Ambulatory Visit (INDEPENDENT_AMBULATORY_CARE_PROVIDER_SITE_OTHER): Payer: Medicare Other | Admitting: Family Medicine

## 2013-02-17 ENCOUNTER — Encounter: Payer: Self-pay | Admitting: Family Medicine

## 2013-02-17 VITALS — BP 130/62 | HR 89 | Temp 97.0°F | Ht 63.0 in | Wt 133.2 lb

## 2013-02-17 DIAGNOSIS — D500 Iron deficiency anemia secondary to blood loss (chronic): Secondary | ICD-10-CM

## 2013-02-17 DIAGNOSIS — J069 Acute upper respiratory infection, unspecified: Secondary | ICD-10-CM | POA: Insufficient documentation

## 2013-02-17 DIAGNOSIS — Z23 Encounter for immunization: Secondary | ICD-10-CM

## 2013-02-17 DIAGNOSIS — N400 Enlarged prostate without lower urinary tract symptoms: Secondary | ICD-10-CM

## 2013-02-17 DIAGNOSIS — E785 Hyperlipidemia, unspecified: Secondary | ICD-10-CM

## 2013-02-17 DIAGNOSIS — I10 Essential (primary) hypertension: Secondary | ICD-10-CM | POA: Diagnosis not present

## 2013-02-17 DIAGNOSIS — I6529 Occlusion and stenosis of unspecified carotid artery: Secondary | ICD-10-CM | POA: Diagnosis not present

## 2013-02-17 DIAGNOSIS — Z87891 Personal history of nicotine dependence: Secondary | ICD-10-CM

## 2013-02-17 DIAGNOSIS — Z0000 Encounter for general adult medical examination without abnormal findings: Secondary | ICD-10-CM | POA: Diagnosis not present

## 2013-02-17 DIAGNOSIS — J449 Chronic obstructive pulmonary disease, unspecified: Secondary | ICD-10-CM

## 2013-02-17 LAB — POCT URINALYSIS DIPSTICK
Glucose, UA: NEGATIVE
Leukocytes, UA: NEGATIVE
Nitrite, UA: NEGATIVE
Protein, UA: NEGATIVE
Urobilinogen, UA: 0.2

## 2013-02-17 LAB — LIPID PANEL
Cholesterol: 153 mg/dL (ref 0–200)
LDL Cholesterol: 86 mg/dL (ref 0–99)
Total CHOL/HDL Ratio: 4

## 2013-02-17 LAB — BASIC METABOLIC PANEL
BUN: 13 mg/dL (ref 6–23)
GFR: 134.6 mL/min (ref >60.00)
Glucose, Bld: 84 mg/dL (ref 70–99)
Potassium: 4.1 mEq/L (ref 3.5–5.1)
Sodium: 132 mEq/L — ABNORMAL LOW (ref 135–145)

## 2013-02-17 LAB — CBC WITH DIFFERENTIAL/PLATELET
Eosinophils Relative: 2 % (ref 0.0–5.0)
HCT: 41 % (ref 39.0–52.0)
Hemoglobin: 13.9 g/dL (ref 13.0–17.0)
Lymphocytes Relative: 8 % — ABNORMAL LOW (ref 12.0–46.0)
Lymphs Abs: 1.1 10*3/uL (ref 0.7–4.0)
Monocytes Relative: 11.6 % (ref 3.0–12.0)
Neutro Abs: 10.6 10*3/uL — ABNORMAL HIGH (ref 1.4–7.7)
Platelets: 261 10*3/uL (ref 150.0–400.0)
WBC: 13.6 10*3/uL — ABNORMAL HIGH (ref 4.5–10.5)

## 2013-02-17 LAB — HEPATIC FUNCTION PANEL
ALT: 30 U/L (ref 0–53)
AST: 21 U/L (ref 0–37)
Alkaline Phosphatase: 62 U/L (ref 39–117)
Bilirubin, Direct: 0.1 mg/dL (ref 0.0–0.3)
Total Bilirubin: 0.5 mg/dL (ref 0.3–1.2)
Total Protein: 7 g/dL (ref 6.0–8.3)

## 2013-02-17 LAB — PSA: PSA: 1.12 ng/mL (ref 0.10–4.00)

## 2013-02-17 MED ORDER — AMLODIPINE BESYLATE 10 MG PO TABS: ORAL_TABLET | ORAL | Status: DC

## 2013-02-17 MED ORDER — ATORVASTATIN CALCIUM 40 MG PO TABS: 40.0000 mg | ORAL_TABLET | Freq: Every day | ORAL | Status: DC

## 2013-02-17 MED ORDER — ACLIDINIUM BROMIDE 400 MCG/ACT IN AEPB: INHALATION_SPRAY | RESPIRATORY_TRACT | Status: DC

## 2013-02-17 MED ORDER — TETANUS-DIPHTH-ACELL PERTUSSIS 5-2.5-18.5 LF-MCG/0.5 IM SUSP: 0.5000 mL | Freq: Once | INTRAMUSCULAR | Status: DC

## 2013-02-17 MED ORDER — AZITHROMYCIN 250 MG PO TABS: ORAL_TABLET | ORAL | Status: DC

## 2013-02-17 MED ORDER — LISINOPRIL-HYDROCHLOROTHIAZIDE 20-12.5 MG PO TABS: ORAL_TABLET | ORAL | Status: DC

## 2013-02-17 MED ORDER — IPRATROPIUM-ALBUTEROL 20-100 MCG/ACT IN AERS: 1.0000 | INHALATION_SPRAY | Freq: Four times a day (QID) | RESPIRATORY_TRACT | Status: DC | PRN

## 2013-02-17 NOTE — Assessment & Plan Note (Signed)
Per vascular  

## 2013-02-17 NOTE — Assessment & Plan Note (Signed)
Stable con't meds 

## 2013-02-17 NOTE — Assessment & Plan Note (Signed)
con't inhalers stable 

## 2013-02-17 NOTE — Progress Notes (Signed)
Pre visit review using our clinic review tool, if applicable. No additional management support is needed unless otherwise documented below in the visit note. 

## 2013-02-17 NOTE — Assessment & Plan Note (Signed)
Check labs con't meds 

## 2013-02-17 NOTE — Assessment & Plan Note (Signed)
Pt has appointment for colon soon

## 2013-02-17 NOTE — Assessment & Plan Note (Signed)
zithromax con't inhalers

## 2013-02-17 NOTE — Progress Notes (Signed)
Subjective:    Ronald Bell is a 71 y.o. male who presents for Medicare Annual/Subsequent preventive examination.   Preventive Screening-Counseling & Management  Tobacco History  Smoking status  . Current Every Day Smoker -- 0.75 packs/day for 55 years  . Types: Cigarettes  Smokeless tobacco  . Never Used    Comment: pt states he knows he needs to quit    Problems Prior to Visit 1. cough  Current Problems (verified) Patient Active Problem List   Diagnosis Date Noted  . PVD (peripheral vascular disease) 10/18/2012  . Occlusion and stenosis of carotid artery without mention of cerebral infarction 04/22/2012  . Postherpetic neuralgia 09/28/2011  . OTHER SPECIFIED DISEASE OF WHITE BLOOD CELLS 01/17/2010  . HEMOCCULT POSITIVE STOOL 01/17/2010  . ANEMIA-IRON DEFICIENCY 01/25/2009  . [facility] 01/25/2009  . ANGIODYSPLASIA-INTESTINE 01/25/2009  . PERSONAL HX COLONIC POLYPS 01/25/2009  . ANEMIA, SECONDARY TO BLOOD LOSS 12/03/2008  . GI BLEEDING 12/03/2008  . UNSPECIFIED ANEMIA 12/01/2008  . GERD 11/30/2008  . CAROTID BRUITS, BILATERAL 05/25/2008  . POLYP OF NASAL CAVITY 07/25/2007  . OTHER SPECIFIED DISORDERS OF ARTERIES&ARTERIOLES 06/24/2007  . NECK PAIN, LEFT 06/22/2007  . Benign Neoplasm of Colon 08/11/2006  . HYPERLIPIDEMIA 08/11/2006  . ERECTILE DYSFUNCTION 08/11/2006  . HYPERTENSION 08/11/2006  . COPD 08/11/2006  . 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 BARRETT'S ESOPHAGUS, HX OF 08/11/2006  . TOBACCO ABUSE, HX OF 08/11/2006    Medications Prior to Visit Current Outpatient Prescriptions on File Prior to Visit  Medication Sig Dispense Refill  . amLODipine (NORVASC) 10 MG tablet TAKE 1 TABLET (10 MG TOTAL) BY MOUTH DAILY.  30 tablet  5  . Ascorbic Acid (VITAMIN C) 250 MG tablet Take 500 mg by mouth daily.       Marland Kitchen aspirin 81 MG EC tablet Take 81 mg by mouth daily.        Marland Kitchen atorvastatin (LIPITOR) 40 MG tablet Take 1 tablet (40 mg total) by mouth daily.   90 tablet  1  . DOCUSATE SODIUM PO Take 1 tablet by mouth 2 (two) times daily.       . Ferrous Sulfate (IRON) 325 (65 FE) MG TABS Take 1 tablet by mouth 2 (two) times daily.  90 each  3  . Ipratropium-Albuterol (COMBIVENT RESPIMAT) 20-100 MCG/ACT AERS respimat Inhale 1 puff into the lungs 4 (four) times daily as needed for wheezing.  1 Inhaler  3  . lisinopril-hydrochlorothiazide (PRINZIDE,ZESTORETIC) 20-12.5 MG per tablet TAKE 1 TABLET BY MOUTH DAILY.  30 tablet  5  . Multiple Vitamins-Minerals (CENTRUM SILVER PO) Take by mouth daily.        . pantoprazole (PROTONIX) 40 MG tablet Take 1 tablet (40 mg total) by mouth daily.  30 tablet  11  . TUDORZA PRESSAIR 400 MCG/ACT AEPB INHALE 1 PUFF BY MOUTH 2 TIMES A DAY.  1 each  5   No current facility-administered medications on file prior to visit.    Current Medications (verified) Current Outpatient Prescriptions  Medication Sig Dispense Refill  . amLODipine (NORVASC) 10 MG tablet TAKE 1 TABLET (10 MG TOTAL) BY MOUTH DAILY.  30 tablet  5  . Ascorbic Acid (VITAMIN C) 250 MG tablet Take 500 mg by mouth daily.       Marland Kitchen aspirin 81 MG EC tablet Take 81 mg by mouth daily.        Marland Kitchen atorvastatin (LIPITOR) 40 MG tablet Take 1 tablet (40 mg total) by mouth daily.  90 tablet  1  .  DOCUSATE SODIUM PO Take 1 tablet by mouth 2 (two) times daily.       . Ferrous Sulfate (IRON) 325 (65 FE) MG TABS Take 1 tablet by mouth 2 (two) times daily.  90 each  3  . Ipratropium-Albuterol (COMBIVENT RESPIMAT) 20-100 MCG/ACT AERS respimat Inhale 1 puff into the lungs 4 (four) times daily as needed for wheezing.  1 Inhaler  3  . lisinopril-hydrochlorothiazide (PRINZIDE,ZESTORETIC) 20-12.5 MG per tablet TAKE 1 TABLET BY MOUTH DAILY.  30 tablet  5  . Multiple Vitamins-Minerals (CENTRUM SILVER PO) Take by mouth daily.        . pantoprazole (PROTONIX) 40 MG tablet Take 1 tablet (40 mg total) by mouth daily.  30 tablet  11  . TUDORZA PRESSAIR 400 MCG/ACT AEPB INHALE 1 PUFF BY MOUTH  2 TIMES A DAY.  1 each  5   No current facility-administered medications for this visit.     Allergies (verified) Review of patient's allergies indicates no known allergies.   PAST HISTORY  Family History Family History  Problem Relation Age of Onset  . Lung disease Mother     pulm fibrosis  . Uterine cancer    . Colitis Father   . Colon cancer Neg Hx   . Heart disease Brother   . Hypertension Brother   . Heart disease Daughter     cad  . Hypertension Son     Social History History  Substance Use Topics  . Smoking status: Current Every Day Smoker -- 0.75 packs/day for 55 years    Types: Cigarettes  . Smokeless tobacco: Never Used     Comment: pt states he knows he needs to quit  . Alcohol Use: No    Are there smokers in your home (other than you)?  No  Risk Factors Current exercise habits: walking  Dietary issues discussed: na   Cardiac risk factors: advanced age (older than 59 for men, 73 for women), dyslipidemia, hypertension, male gender and smoking/ tobacco exposure.  Depression Screen (Note: if answer to either of the following is "Yes", a more complete depression screening is indicated)   Q1: Over the past two weeks, have you felt down, depressed or hopeless? No  Q2: Over the past two weeks, have you felt little interest or pleasure in doing things? No  Have you lost interest or pleasure in daily life? No  Do you often feel hopeless? No  Do you cry easily over simple problems? No  Activities of Daily Living In your present state of health, do you have any difficulty performing the following activities?:  Driving? No Managing money?  No Feeding yourself? No Getting from bed to chair? No Climbing a flight of stairs? No Preparing food and eating?: No Bathing or showering? No Getting dressed: No Getting to the toilet? No Using the toilet:No Moving around from place to place: No In the past year have you fallen or had a near fall?:No   Are you  sexually active?  Yes  Do you have more than one partner?  No  Hearing Difficulties: No Do you often ask people to speak up or repeat themselves? No Do you experience ringing or noises in your ears? No Do you have difficulty understanding soft or whispered voices? No   Do you feel that you have a problem with memory? No  Do you often misplace items? No  Do you feel safe at home?  No  Cognitive Testing  Alert? Yes  Normal Appearance?Yes  Oriented  to person? Yes  Place? Yes   Time? Yes  Recall of three objects?  Yes  Can perform simple calculations? Yes  Displays appropriate judgment?Yes  Can read the correct time from a watch face?Yes   Advanced Directives have been discussed with the patient? Yes   List the Names of Other Physician/Practitioners you currently use: 1.  Card--[facility] 2   Vascular-- [facility]  oph-- [facility]   4. GI -- [facility] Ice any recent Medical Services you may have received from other than [facility] providers in the past year (date may be approximate).  Immunization History  Administered Date(s) Administered  . Influenza Split 01/23/2011, 02/12/2012  . Influenza Whole 01/28/2007, 12/14/2007, 12/14/2008, 01/17/2010  . Influenza,inj,Quad PF,36+ Mos 02/17/2013  . Pneumococcal Polysaccharide-23 12/18/2003, 11/30/2008  . Td 03/23/2002  . Zoster 01/23/2011    Screening Tests Health Maintenance  Topic Date Due  . Tetanus/tdap  03/23/2012  . Colonoscopy  04/02/2013  . Influenza Vaccine  10/21/2013  . Pneumococcal Polysaccharide Vaccine Age 18 And Over  Completed  . Zostavax  Completed    All answers were reviewed with the patient and necessary referrals were made:  Loreen Freud, DO   02/17/2013   History reviewed:  He  has a past medical history of Other symptoms involving cardiovascular system; Polyp of nasal cavity; Other specified disorders of arteries and arterioles; Cervicalgia; Routine general medical examination at a health care facility;  Rosacea; Benign neoplasm of colon; Personal history of tobacco use, presenting hazards to health; Personal history of other diseases of digestive system; Psychosexual dysfunction with inhibited sexual excitement; Chronic airway obstruction, not elsewhere classified; Unspecified essential hypertension; Other and unspecified hyperlipidemia; GERD (gastroesophageal reflux disease); Anemia; Barrett esophagus; Arthritis; Blood transfusion (2010); Adenomatous colon polyp; Peripheral vascular disease; Irregular heartbeat; Shingles; and Carotid artery occlusion. He  does not have any pertinent problems on file. He  has past surgical history that includes Colon surgery and Iliac artery stent (2005). His family history includes Colitis in his father; Heart disease in his brother and daughter; Hypertension in his brother and son; Lung disease in his mother; Uterine cancer in an other family member. There is no history of Colon cancer. He  reports that he has been smoking Cigarettes.  He has a 41.25 pack-year smoking history. He has never used smokeless tobacco. He reports that he does not drink alcohol or use illicit drugs. He has a current medication list which includes the following prescription(s): amlodipine, vitamin c, aspirin, atorvastatin, docusate sodium, iron, ipratropium-albuterol, lisinopril-hydrochlorothiazide, multiple vitamins-minerals, pantoprazole, and tudorza pressair. Current Outpatient Prescriptions on File Prior to Visit  Medication Sig Dispense Refill  . amLODipine (NORVASC) 10 MG tablet TAKE 1 TABLET (10 MG TOTAL) BY MOUTH DAILY.  30 tablet  5  . Ascorbic Acid (VITAMIN C) 250 MG tablet Take 500 mg by mouth daily.       Marland Kitchen aspirin 81 MG EC tablet Take 81 mg by mouth daily.        Marland Kitchen atorvastatin (LIPITOR) 40 MG tablet Take 1 tablet (40 mg total) by mouth daily.  90 tablet  1  . DOCUSATE SODIUM PO Take 1 tablet by mouth 2 (two) times daily.       . Ferrous Sulfate (IRON) 325 (65 FE) MG TABS Take 1  tablet by mouth 2 (two) times daily.  90 each  3  . Ipratropium-Albuterol (COMBIVENT RESPIMAT) 20-100 MCG/ACT AERS respimat Inhale 1 puff into the lungs 4 (four) times daily as needed for wheezing.  1 Inhaler  3  . lisinopril-hydrochlorothiazide (PRINZIDE,ZESTORETIC) 20-12.5 MG per tablet TAKE 1 TABLET BY MOUTH DAILY.  30 tablet  5  . Multiple Vitamins-Minerals (CENTRUM SILVER PO) Take by mouth daily.        . pantoprazole (PROTONIX) 40 MG tablet Take 1 tablet (40 mg total) by mouth daily.  30 tablet  11  . TUDORZA PRESSAIR 400 MCG/ACT AEPB INHALE 1 PUFF BY MOUTH 2 TIMES A DAY.  1 each  5   No current facility-administered medications on file prior to visit.   He has No Known Allergies.  Review of Systems  Review of Systems  Constitutional: Negative for activity change, appetite change and fatigue.  HENT: Negative for hearing loss, congestion, tinnitus and ear discharge.   Eyes: Negative for visual disturbance (see optho q1y -- vision corrected to 20/20 with glasses).  Respiratory: + dry cough,  Cardiovascular: Negative for chest pain, palpitations and leg swelling.  Gastrointestinal: Negative for abdominal pain, diarrhea, constipation and abdominal distention.  Genitourinary: Negative for urgency, frequency, decreased urine volume and difficulty urinating.  Musculoskeletal: Negative for back pain, arthralgias and gait problem.  Skin: Negative for color change, pallor and rash.  Neurological: Negative for dizziness, light-headedness, numbness and headaches.  Hematological: Negative for adenopathy. Does not bruise/bleed easily.  Psychiatric/Behavioral: Negative for suicidal ideas, confusion, sleep disturbance, self-injury, dysphoric mood, decreased concentration and agitation.  Pt is able to read and write and can do all ADLs No risk for falling No abuse/ violence in home     Objective:     Vision by Snellen chart: opth Blood pressure 130/62, pulse 89, temperature 97 F (36.1 C),  temperature source Tympanic, height 5\' 3"  (1.6 m), weight 133 lb 3.2 oz (60.419 kg), SpO2 95.00%. Body mass index is 23.6 kg/(m^2).  BP 130/62  Pulse 89  Temp(Src) 97 F (36.1 C) (Tympanic)  Ht 5\' 3"  (1.6 m)  Wt 133 lb 3.2 oz (60.419 kg)  BMI 23.60 kg/m2  SpO2 95% General appearance: alert, cooperative, appears stated age and no distress Head: Normocephalic, without obvious abnormality, atraumatic Eyes: negative findings: lids and lashes normal and pupils equal, round, reactive to light and accomodation Ears: normal TM's and external ear canals both ears Nose: Nares normal. Septum midline. Mucosa normal. No drainage or sinus tenderness. Throat: lips, mucosa, and tongue normal; teeth and gums normal Neck: no adenopathy, no carotid bruit, no JVD, supple, symmetrical, trachea midline and thyroid not enlarged, symmetric, no tenderness/mass/nodules Back: symmetric, no curvature. ROM normal. No CVA tenderness. Lungs: clear to auscultation bilaterally Chest wall: no tenderness Heart: S1, S2 normal  + murmur Abdomen: soft, non-tender; bowel sounds normal; no masses,  no organomegaly Male genitalia: normal, penis: no lesions or discharge. testes: no masses or tenderness. no hernias Rectal: + enlarged prostate Extremities: extremities normal, atraumatic, no cyanosis or edema Pulses: 2+ and symmetric Skin: Skin color, texture, turgor normal. No rashes or lesions Lymph nodes: Cervical, supraclavicular, and axillary nodes normal. Neurologic: Alert and oriented X 3, normal strength and tone. Normal symmetric reflexes. Normal coordination and gait Psych-- no anxiety, no depression      Assessment:     cpe     Plan:     During the course of the visit the patient was educated and counseled about appropriate screening and preventive services including:    Pneumococcal vaccine   Influenza vaccine  Td vaccine  Prostate cancer screening  Colorectal cancer screening  Glaucoma  screening  Smoking cessation counseling  Advanced directives:  has an advanced directive - a copy HAS NOT been provided.  Diet review for nutrition referral? Yes ____  Not Indicated __x__   Patient Instructions (the written plan) was given to the patient.  Medicare Attestation I have personally reviewed: The patient's medical and social history Their use of alcohol, tobacco or illicit drugs Their current medications and supplements The patient's functional ability including ADLs,fall risks, home safety risks, cognitive, and hearing and visual impairment Diet and physical activities Evidence for depression or mood disorders  The patient's weight, height, BMI, and visual acuity have been recorded in the chart.  I have made referrals, counseling, and provided education to the patient based on review of the above and I have provided the patient with a written personalized care plan for preventive services.     Loreen Freud, DO   02/17/2013

## 2013-02-17 NOTE — Assessment & Plan Note (Signed)
Pt using e cig

## 2013-02-17 NOTE — Patient Instructions (Signed)
 Preventive Care for Adults, Male A healthy lifestyle and preventive care can promote health and wellness. Preventive health guidelines for men include the following key practices:  A routine yearly physical is a good way to check with your caregiver about your health and preventative screening. It is a chance to share any concerns and updates on your health, and to receive a thorough exam.  Visit your dentist for a routine exam and preventative care every 6 months. Brush your teeth twice a day and floss once a day. Good oral hygiene prevents tooth decay and gum disease.  The frequency of eye exams is based on your age, health, family medical history, use of contact lenses, and other factors. Follow your caregiver's recommendations for frequency of eye exams.  Eat a healthy diet. Foods like vegetables, fruits, whole grains, low-fat dairy products, and lean protein foods contain the nutrients you need without too many calories. Decrease your intake of foods high in solid fats, added sugars, and salt. Eat the right amount of calories for you.Get information about a proper diet from your caregiver, if necessary.  Regular physical exercise is one of the most important things you can do for your health. Most adults should get at least 150 minutes of moderate-intensity exercise (any activity that increases your heart rate and causes you to sweat) each week. In addition, most adults need muscle-strengthening exercises on 2 or more days a week.  Maintain a healthy weight. The body mass index (BMI) is a screening tool to identify possible weight problems. It provides an estimate of body fat based on height and weight. Your caregiver can help determine your BMI, and can help you achieve or maintain a healthy weight.For adults 20 years and older:  A BMI below 18.5 is considered underweight.  A BMI of 18.5 to 24.9 is normal.  A BMI of 25 to 29.9 is considered overweight.  A BMI of 30 and above is  considered obese.  Maintain normal blood lipids and cholesterol levels by exercising and minimizing your intake of saturated fat. Eat a balanced diet with plenty of fruit and vegetables. Blood tests for lipids and cholesterol should begin at age 20 and be repeated every 5 years. If your lipid or cholesterol levels are high, you are over 50, or you are a high risk for heart disease, you may need your cholesterol levels checked more frequently.Ongoing high lipid and cholesterol levels should be treated with medicines if diet and exercise are not effective.  If you smoke, find out from your caregiver how to quit. If you do not use tobacco, do not start.  Lung cancer screening is recommended for adults aged 55 80 years who are at high risk for developing lung cancer because of a history of smoking. Yearly low-dose computed tomography (CT) is recommended for people who have at least a 30-pack-year history of smoking and are a current smoker or have quit within the past 15 years. A pack year of smoking is smoking an average of 1 pack of cigarettes a day for 1 year (for example: 1 pack a day for 30 years or 2 packs a day for 15 years). Yearly screening should continue until the smoker has stopped smoking for at least 15 years. Yearly screening should also be stopped for people who develop a health problem that would prevent them from having lung cancer treatment.  If you choose to drink alcohol, do not exceed 2 drinks per day. One drink is considered to be 12   ounces (355 mL) of beer, 5 ounces (148 mL) of wine, or 1.5 ounces (44 mL) of liquor.  Avoid use of street drugs. Do not share needles with anyone. Ask for help if you need support or instructions about stopping the use of drugs.  High blood pressure causes heart disease and increases the risk of stroke. Your blood pressure should be checked at least every 1 to 2 years. Ongoing high blood pressure should be treated with medicines, if weight loss and  exercise are not effective.  If you are 45 to 74 years old, ask your caregiver if you should take aspirin to prevent heart disease.  Diabetes screening involves taking a blood sample to check your fasting blood sugar level. This should be done once every 3 years, after age 45, if you are within normal weight and without risk factors for diabetes. Testing should be considered at a younger age or be carried out more frequently if you are overweight and have at least 1 risk factor for diabetes.  Colorectal cancer can be detected and often prevented. Most routine colorectal cancer screening begins at the age of 50 and continues through age 75. However, your caregiver may recommend screening at an earlier age if you have risk factors for colon cancer. On a yearly basis, your caregiver may provide home test kits to check for hidden blood in the stool. Use of a small camera at the end of a tube, to directly examine the colon (sigmoidoscopy or colonoscopy), can detect the earliest forms of colorectal cancer. Talk to your caregiver about this at age 50, when routine screening begins. Direct examination of the colon should be repeated every 5 to 10 years through age 75, unless early forms of pre-cancerous polyps or small growths are found.  Hepatitis C blood testing is recommended for all people born from 1945 through 1965 and any individual with known risks for hepatitis C.  Practice safe sex. Use condoms and avoid high-risk sexual practices to reduce the spread of sexually transmitted infections (STIs). STIs include gonorrhea, chlamydia, syphilis, trichomonas, herpes, HPV, and human immunodeficiency virus (HIV). Herpes, HIV, and HPV are viral illnesses that have no cure. They can result in disability, cancer, and death.  A one-time screening for abdominal aortic aneurysm (AAA) and surgical repair of large AAAs by sound wave imaging (ultrasonography) is recommended for ages 65 to 75 years who are current or  former smokers.  Healthy men should no longer receive prostate-specific antigen (PSA) blood tests as part of routine cancer screening. Consult with your caregiver about prostate cancer screening.  Testicular cancer screening is not recommended for adult males who have no symptoms. Screening includes self-exam, caregiver exam, and other screening tests. Consult with your caregiver about any symptoms you have or any concerns you have about testicular cancer.  Use sunscreen. Apply sunscreen liberally and repeatedly throughout the day. You should seek shade when your shadow is shorter than you. Protect yourself by wearing long sleeves, pants, a wide-brimmed hat, and sunglasses year round, whenever you are outdoors.  Once a month, do a whole body skin exam, using a mirror to look at the skin on your back. Notify your caregiver of new moles, moles that have irregular borders, moles that are larger than a pencil eraser, or moles that have changed in shape or color.  Stay current with required immunizations.  Influenza vaccine. All adults should be immunized every year.  Tetanus, diphtheria, and acellular pertussis (Td, Tdap) vaccine. An adult who has not   previously received Tdap or who does not know his vaccine status should receive 1 dose of Tdap. This initial dose should be followed by tetanus and diphtheria toxoids (Td) booster doses every 10 years. Adults with an unknown or incomplete history of completing a 3-dose immunization series with Td-containing vaccines should begin or complete a primary immunization series including a Tdap dose. Adults should receive a Td booster every 10 years.  Varicella vaccine. An adult without evidence of immunity to varicella should receive 2 doses or a second dose if he has previously received 1 dose.  Human papillomavirus (HPV) vaccine. Males aged 13 21 years who have not received the vaccine previously should receive the 3-dose series. Males aged 22 26 years may be  immunized. Immunization is recommended through the age of 26 years for any male who has sex with males and did not get any or all doses earlier. Immunization is recommended for any person with an immunocompromised condition through the age of 26 years if he did not get any or all doses earlier. During the 3-dose series, the second dose should be obtained 4 8 weeks after the first dose. The third dose should be obtained 24 weeks after the first dose and 16 weeks after the second dose.  Zoster vaccine. One dose is recommended for adults aged 60 years or older unless certain conditions are present.  Measles, mumps, and rubella (MMR) vaccine. Adults born before 1957 generally are considered immune to measles and mumps. Adults born in 1957 or later should have 1 or more doses of MMR vaccine unless there is a contraindication to the vaccine or there is laboratory evidence of immunity to each of the three diseases. A routine second dose of MMR vaccine should be obtained at least 28 days after the first dose for students attending postsecondary schools, health care workers, or international travelers. People who received inactivated measles vaccine or an unknown type of measles vaccine during 1963 1967 should receive 2 doses of MMR vaccine. People who received inactivated mumps vaccine or an unknown type of mumps vaccine before 1979 and are at high risk for mumps infection should consider immunization with 2 doses of MMR vaccine. Unvaccinated health care workers born before 1957 who lack laboratory evidence of measles, mumps, or rubella immunity or laboratory confirmation of disease should consider measles and mumps immunization with 2 doses of MMR vaccine or rubella immunization with 1 dose of MMR vaccine.  Pneumococcal 13-valent conjugate (PCV13) vaccine. When indicated, a person who is uncertain of his immunization history and has no record of immunization should receive the PCV13 vaccine. An adult aged 19 years or  older who has certain medical conditions and has not been previously immunized should receive 1 dose of PCV13 vaccine. This PCV13 should be followed with a dose of pneumococcal polysaccharide (PPSV23) vaccine. The PPSV23 vaccine dose should be obtained at least 8 weeks after the dose of PCV13 vaccine. An adult aged 19 years or older who has certain medical conditions and previously received 1 or more doses of PPSV23 vaccine should receive 1 dose of PCV13. The PCV13 vaccine dose should be obtained 1 or more years after the last PPSV23 vaccine dose.  Pneumococcal polysaccharide (PPSV23) vaccine. When PCV13 is also indicated, PCV13 should be obtained first. All adults aged 65 years and older should be immunized. An adult younger than age 65 years who has certain medical conditions should be immunized. Any person who resides in a nursing home or long-term care facility should be   immunized. An adult smoker should be immunized. People with an immunocompromised condition and certain other conditions should receive both PCV13 and PPSV23 vaccines. People with human immunodeficiency virus (HIV) infection should be immunized as soon as possible after diagnosis. Immunization during chemotherapy or radiation therapy should be avoided. Routine use of PPSV23 vaccine is not recommended for American Indians, [place] Natives, or people younger than 65 years unless there are medical conditions that require PPSV23 vaccine. When indicated, people who have unknown immunization and have no record of immunization should receive PPSV23 vaccine. One-time revaccination 5 years after the first dose of PPSV23 is recommended for people aged 19 64 years who have chronic kidney failure, nephrotic syndrome, asplenia, or immunocompromised conditions. People who received 1 2 doses of PPSV23 before age 65 years should receive another dose of PPSV23 vaccine at age 65 years or later if at least 5 years have passed since the previous dose. Doses of  PPSV23 are not needed for people immunized with PPSV23 at or after age 65 years.  Meningococcal vaccine. Adults with asplenia or persistent complement component deficiencies should receive 2 doses of quadrivalent meningococcal conjugate (MenACWY-D) vaccine. The doses should be obtained at least 2 months apart. Microbiologists working with certain meningococcal bacteria, military recruits, people at risk during an outbreak, and people who travel to or live in countries with a high rate of meningitis should be immunized. A first-year college student up through age 21 years who is living in a residence hall should receive a dose if he did not receive a dose on or after his 16th birthday. Adults who have certain high-risk conditions should receive one or more doses of vaccine.  Hepatitis A vaccine. Adults who wish to be protected from this disease, have certain high-risk conditions, work with hepatitis A-infected animals, work in hepatitis A research labs, or travel to or work in countries with a high rate of hepatitis A should be immunized. Adults who were previously unvaccinated and who anticipate close contact with an international adoptee during the first 60 days after arrival in the [place] from a country with a high rate of hepatitis A should be immunized.  Hepatitis B vaccine. Adults who wish to be protected from this disease, have certain high-risk conditions, may be exposed to blood or other infectious body fluids, are household contacts or sex partners of hepatitis B positive people, are clients or workers in certain care facilities, or travel to or work in countries with a high rate of hepatitis B should be immunized.  Haemophilus influenzae type b (Hib) vaccine. A previously unvaccinated person with asplenia or sickle cell disease or having a scheduled splenectomy should receive 1 dose of Hib vaccine. Regardless of previous immunization, a recipient of a hematopoietic stem cell transplant  should receive a 3-dose series 6 12 months after his successful transplant. Hib vaccine is not recommended for adults with HIV infection. Preventive Service / Frequency Ages 19 to 39  Blood pressure check.** / Every 1 to 2 years.  Lipid and cholesterol check.** / Every 5 years beginning at age 20.  Hepatitis C blood test.** / For any individual with known risks for hepatitis C.  Skin self-exam. / Monthly.  Influenza vaccine. / Every year.  Tetanus, diphtheria, and acellular pertussis (Tdap, Td) vaccine.** / Consult your caregiver. 1 dose of Td every 10 years.  Varicella vaccine.** / Consult your caregiver.  HPV vaccine. / 3 doses over 6 months, if 26 and younger.  Measles, mumps, rubella (MMR) vaccine.** /   You need at least 1 dose of MMR if you were born in 1957 or later. You may also need a 2nd dose.  Pneumococcal 13-valent conjugate (PCV13) vaccine.** / Consult your caregiver.  Pneumococcal polysaccharide (PPSV23) vaccine.** / 1 to 2 doses if you smoke cigarettes or if you have certain conditions.  Meningococcal vaccine.** / 1 dose if you are age 19 to 21 years and a first-year college student living in a residence hall, or have one of several medical conditions, you need to get vaccinated against meningococcal disease. You may also need additional booster doses.  Hepatitis A vaccine.** / Consult your caregiver.  Hepatitis B vaccine.** / Consult your caregiver.  Haemophilus influenzae type b (Hib) vaccine.** / Consult your caregiver. Ages 40 to 64  Blood pressure check.** / Every 1 to 2 years.  Lipid and cholesterol check.** / Every 5 years beginning at age 20.  Lung cancer screening. / Every year if you are aged 55 80 years and have a 30-pack-year history of smoking and currently smoke or have quit within the past 15 years. Yearly screening is stopped once you have quit smoking for at least 15 years or develop a health problem that would prevent you from having lung cancer  treatment.  Fecal occult blood test (FOBT) of stool. / Every year beginning at age 50 and continuing until age 75. You may not have to do this test if you get colonoscopy every 10 years.  Flexible sigmoidoscopy** or colonoscopy.** / Every 5 years for a flexible sigmoidoscopy or every 10 years for a colonoscopy beginning at age 50 and continuing until age 75.  Hepatitis C blood test.** / For all people born from 1945 through 1965 and any individual with known risks for hepatitis C.  Skin self-exam. / Monthly.  Influenza vaccine. / Every year.  Tetanus, diphtheria, and acellular pertussis (Tdap/Td) vaccine.** / Consult your caregiver. 1 dose of Td every 10 years.  Varicella vaccine.** / Consult your caregiver.  Zoster vaccine.** / 1 dose for adults aged 60 years or older.  Measles, mumps, rubella (MMR) vaccine.** / You need at least 1 dose of MMR if you were born in 1957 or later. You may also need a 2nd dose.  Pneumococcal 13-valent conjugate (PCV13) vaccine.** / Consult your caregiver.  Pneumococcal polysaccharide (PPSV23) vaccine.** / 1 to 2 doses if you smoke cigarettes or if you have certain conditions.  Meningococcal vaccine.** / Consult your caregiver.  Hepatitis A vaccine.** / Consult your caregiver.  Hepatitis B vaccine.** / Consult your caregiver.  Haemophilus influenzae type b (Hib) vaccine.** / Consult your caregiver. Ages 65 and over  Blood pressure check.** / Every 1 to 2 years.  Lipid and cholesterol check.**/ Every 5 years beginning at age 20.  Lung cancer screening. / Every year if you are aged 55 80 years and have a 30-pack-year history of smoking and currently smoke or have quit within the past 15 years. Yearly screening is stopped once you have quit smoking for at least 15 years or develop a health problem that would prevent you from having lung cancer treatment.  Fecal occult blood test (FOBT) of stool. / Every year beginning at age 50 and continuing until  age 75. You may not have to do this test if you get colonoscopy every 10 years.  Flexible sigmoidoscopy** or colonoscopy.** / Every 5 years for a flexible sigmoidoscopy or every 10 years for a colonoscopy beginning at age 50 and continuing until age 75.  Hepatitis C blood   test.** / For all people born from 1945 through 1965 and any individual with known risks for hepatitis C.  Abdominal aortic aneurysm (AAA) screening.** / A one-time screening for ages 65 to 75 years who are current or former smokers.  Skin self-exam. / Monthly.  Influenza vaccine. / Every year.  Tetanus, diphtheria, and acellular pertussis (Tdap/Td) vaccine.** / 1 dose of Td every 10 years.  Varicella vaccine.** / Consult your caregiver.  Zoster vaccine.** / 1 dose for adults aged 60 years or older.  Pneumococcal 13-valent conjugate (PCV13) vaccine.** / Consult your caregiver.  Pneumococcal polysaccharide (PPSV23) vaccine.** / 1 dose for all adults aged 65 years and older.  Meningococcal vaccine.** / Consult your caregiver.  Hepatitis A vaccine.** / Consult your caregiver.  Hepatitis B vaccine.** / Consult your caregiver.  Haemophilus influenzae type b (Hib) vaccine.** / Consult your caregiver. **Family history and personal history of risk and conditions may change your caregiver's recommendations. Document Released: 05/05/2001 Document Revised: 07/04/2012 Document Reviewed: 08/04/2010 [employer] Patient Information 2014 [employer].  

## 2013-02-22 ENCOUNTER — Other Ambulatory Visit: Payer: Self-pay

## 2013-02-22 DIAGNOSIS — D7289 Other specified disorders of white blood cells: Secondary | ICD-10-CM

## 2013-02-27 ENCOUNTER — Encounter: Payer: Self-pay | Admitting: Family Medicine

## 2013-02-27 ENCOUNTER — Ambulatory Visit (INDEPENDENT_AMBULATORY_CARE_PROVIDER_SITE_OTHER): Payer: Medicare Other | Admitting: Family Medicine

## 2013-02-27 VITALS — BP 138/60 | HR 88 | Temp 97.9°F | Wt 133.0 lb

## 2013-02-27 DIAGNOSIS — J209 Acute bronchitis, unspecified: Secondary | ICD-10-CM

## 2013-02-27 MED ORDER — PREDNISONE 10 MG PO TABS: ORAL_TABLET | ORAL | Status: DC

## 2013-02-27 MED ORDER — AZITHROMYCIN 250 MG PO TABS: ORAL_TABLET | ORAL | Status: DC

## 2013-02-27 MED ORDER — IPRATROPIUM-ALBUTEROL 0.5-2.5 (3) MG/3ML IN SOLN
3.0000 mL | Freq: Once | RESPIRATORY_TRACT | Status: AC
  Administered 2013-02-27: 3 mL via RESPIRATORY_TRACT

## 2013-02-27 NOTE — Progress Notes (Signed)
Pre visit review using our clinic review tool, if applicable. No additional management support is needed unless otherwise documented below in the visit note. 

## 2013-02-27 NOTE — Patient Instructions (Signed)

## 2013-02-27 NOTE — Progress Notes (Signed)
  Subjective:     Ronald Bell is a 72 y.o. male here for evaluation of a cough. Onset of symptoms was several days ago. Symptoms have been gradually worsening since that time. The cough is barky and productive and is aggravated by cold air, infection and reclining position. Associated symptoms include: shortness of breath, sputum production and wheezing. Patient does not have a history of asthma but does have copd. Patient does not have a history of environmental allergens. Patient has not traveled recently. Patient does have a history of smoking. Patient has not had a previous chest x-ray. Patient has not had a PPD done.  The following portions of the patient's history were reviewed and updated as appropriate: allergies, current medications, past family history, past medical history, past social history, past surgical history and problem list.  Review of Systems Pertinent items are noted in HPI.    Objective:    Oxygen saturation 95% on room air BP 138/60  Pulse 88  Temp(Src) 97.9 F (36.6 C) (Oral)  Wt 133 lb (60.328 kg)  SpO2 95% General appearance: alert, cooperative, appears stated age and no distress Ears: normal TM's and external ear canals both ears Nose: green discharge, moderate congestion, turbinates red, swollen, sinus tenderness bilateral Throat: lips, mucosa, and tongue normal; teeth and gums normal Neck: moderate anterior cervical adenopathy and thyroid not enlarged, symmetric, no tenderness/mass/nodules Lungs: diminished breath sounds bilaterally Heart: S1, S2 normal    Assessment:    Acute Bronchitis and COPD with exacerbation    Plan:    Antibiotics per medication orders. Antitussives per medication orders. Avoid exposure to tobacco smoke and fumes. B-agonist inhaler. Call if shortness of breath worsens, blood in sputum, change in character of cough, development of fever or chills, inability to maintain nutrition and hydration. Avoid exposure to tobacco  smoke and fumes. Steroid inhaler as ordered.

## 2013-03-03 ENCOUNTER — Ambulatory Visit
Admission: RE | Admit: 2013-03-03 | Discharge: 2013-03-03 | Disposition: A | Discharge status: Home / Self Care | Payer: Medicare Other | Source: Ambulatory Visit | Attending: Family Medicine | Admitting: Family Medicine

## 2013-03-03 DIAGNOSIS — R091 Pleurisy: Secondary | ICD-10-CM | POA: Diagnosis not present

## 2013-03-03 DIAGNOSIS — J209 Acute bronchitis, unspecified: Secondary | ICD-10-CM

## 2013-03-06 ENCOUNTER — Other Ambulatory Visit: Payer: Self-pay | Admitting: Family Medicine

## 2013-03-06 ENCOUNTER — Other Ambulatory Visit: Payer: Self-pay

## 2013-03-06 DIAGNOSIS — R911 Solitary pulmonary nodule: Secondary | ICD-10-CM

## 2013-03-08 ENCOUNTER — Other Ambulatory Visit: Payer: No Typology Code available for payment source

## 2013-03-10 ENCOUNTER — Ambulatory Visit
Admission: RE | Admit: 2013-03-10 | Discharge: 2013-03-10 | Disposition: A | Discharge status: Home / Self Care | Payer: Medicare Other | Source: Ambulatory Visit | Attending: Family Medicine | Admitting: Family Medicine

## 2013-03-10 DIAGNOSIS — R911 Solitary pulmonary nodule: Secondary | ICD-10-CM

## 2013-03-10 DIAGNOSIS — J438 Other emphysema: Secondary | ICD-10-CM | POA: Diagnosis not present

## 2013-03-10 MED ORDER — IOHEXOL 300 MG/ML  SOLN
75.0000 mL | Freq: Once | INTRAMUSCULAR | Status: AC | PRN
  Administered 2013-03-10: 75 mL via INTRAVENOUS

## 2013-03-13 ENCOUNTER — Other Ambulatory Visit: Payer: Self-pay

## 2013-03-13 DIAGNOSIS — Z77090 Contact with and (suspected) exposure to asbestos: Secondary | ICD-10-CM

## 2013-03-13 DIAGNOSIS — J948 Other specified pleural conditions: Secondary | ICD-10-CM

## 2013-03-13 DIAGNOSIS — J441 Chronic obstructive pulmonary disease with (acute) exacerbation: Secondary | ICD-10-CM

## 2013-03-27 ENCOUNTER — Encounter: Payer: Self-pay | Admitting: Internal Medicine

## 2013-03-27 ENCOUNTER — Ambulatory Visit (INDEPENDENT_AMBULATORY_CARE_PROVIDER_SITE_OTHER): Payer: Medicare Other | Admitting: Internal Medicine

## 2013-03-27 VITALS — BP 124/58 | HR 78 | Temp 98.2°F | Ht 63.0 in | Wt 129.0 lb

## 2013-03-27 DIAGNOSIS — F17200 Nicotine dependence, unspecified, uncomplicated: Secondary | ICD-10-CM | POA: Diagnosis not present

## 2013-03-27 DIAGNOSIS — R059 Cough, unspecified: Secondary | ICD-10-CM

## 2013-03-27 DIAGNOSIS — R091 Pleurisy: Secondary | ICD-10-CM

## 2013-03-27 DIAGNOSIS — R05 Cough: Secondary | ICD-10-CM

## 2013-03-27 DIAGNOSIS — J920 Pleural plaque with presence of asbestos: Secondary | ICD-10-CM

## 2013-03-27 DIAGNOSIS — I10 Essential (primary) hypertension: Secondary | ICD-10-CM | POA: Diagnosis not present

## 2013-03-27 DIAGNOSIS — T65891A Toxic effect of other specified substances, accidental (unintentional), initial encounter: Secondary | ICD-10-CM

## 2013-03-27 HISTORY — DX: Pleural plaque with presence of asbestos: J92.0

## 2013-03-27 MED ORDER — OLMESARTAN MEDOXOMIL-HCTZ 20-12.5 MG PO TABS: 1.0000 | ORAL_TABLET | Freq: Every day | ORAL | Status: DC

## 2013-03-27 NOTE — Assessment & Plan Note (Signed)
ACE inhibitors are problematic in  pts with airway complaints because  even experienced pulmonologists can't always distinguish ace effects from copd/asthma.  By themselves they don't actually cause a problem, much like oxygen can't by itself start a fire, but they certainly serve as a powerful catalyst or enhancer for any "fire"  or inflammatory process in the upper airway, be it caused by an ET  tube or more commonly reflux (especially in the obese or pts with known GERD or who are on biphoshonates).    In the era of ARB near equivalency until we have a better handle on the reversibility of the airway problem, it just makes sense to avoid ACEI  entirely in the short run and then decide later, having established a level of airway control using a reasonable limited regimen, whether to add back ace but even then being very careful to observe the pt for worsening airway control and number of meds used/ needed to control symptoms.    For now d/c lisinopril and start benicar 20/12.5 one daily , the only way to sort this out

## 2013-03-27 NOTE — Assessment & Plan Note (Addendum)
The most common causes of chronic cough in immunocompetent adults include the following: upper airway cough syndrome (UACS), previously referred to as postnasal drip syndrome (PNDS), which is caused by variety of rhinosinus conditions; (2) asthma; (3) GERD; (4) chronic bronchitis from cigarette smoking or other inhaled environmental irritants; (5) nonasthmatic eosinophilic bronchitis; and (6) bronchiectasis.   These conditions, singly or in combination, have accounted for up to 94% of the causes of chronic cough in prospective studies.   Other conditions have constituted no >6% of the causes in prospective studies These have included bronchogenic carcinoma, chronic interstitial pneumonia, sarcoidosis, left ventricular failure, ACEI-induced cough, and aspiration from a condition associated with pharyngeal dysfunction.    Chronic cough is often simultaneously caused by more than one condition. A single cause has been found from 38 to 82% of the time, multiple causes from 18 to 62%. Multiply caused cough has been the result of three diseases up to 42% of the time.   This fits best with CB from smoking and ACEi exposure (especially then noct choking sensations though could be noct gerd also) > ideally needs off cigs > acei avoidance but unlikely to comply with stopping smoking so will start by stopping acei x 4 weeks then ov with pfts to regroup.

## 2013-03-27 NOTE — Progress Notes (Addendum)
   Subjective:    Patient ID: SAATHVIK EVERY, male    DOB: November 24, 1940  MRN: [id number]  HPI  67 yowm smoker from [place] with variable cough starting Fall on Nov 2014 referred 03/27/2013 by Dr Etter Sjogren for abn CT c/w asbestosis   03/27/2013 1st [facility] office visit/ [name] still active smoker cc recurrent episodes of sever refractory cough this episode started x 3 weeks prior to OV   but comes and goes since early fall 2014 assoc with doe x  walking garbage to street x 3 years better up to several hours after combivent on tudorza bid miant as well.  Cough is variably prod of thick yellow mucus but min amt, mostly prod in am and dry rest of the day.  Has noct choking spells just about every night p lies down better if sits up and takes combivent - they feel almost  like something strangling him.   No obvious other  patterns day to day or daytime variabilty or assoc  cp or chest tightness, subjective wheeze overt sinus or hb symptoms. No unusual exp hx or h/o childhood pna/ asthma or knowledge of premature birth.  Sleeping ok without nocturnal  or early am exacerbation  of respiratory  c/o's or need for noct saba. Also denies any obvious fluctuation of symptoms with weather or environmental changes or other aggravating or alleviating factors except as outlined above   Current Medications, Allergies, Complete Past Medical History, Past Surgical History, Family History, and Social History were reviewed in [employer] record.  .        Review of Systems  Constitutional: Negative for fever and unexpected weight change.  HENT: Positive for congestion. Negative for dental problem, ear pain, nosebleeds, postnasal drip, rhinorrhea, sinus pressure, sneezing, sore throat and trouble swallowing.   Eyes: Negative for redness and itching.  Respiratory: Positive for cough and shortness of breath. Negative for chest tightness and wheezing.   Cardiovascular: Negative for palpitations  and leg swelling.  Gastrointestinal: Negative for nausea and vomiting.  Genitourinary: Negative for dysuria.  Musculoskeletal: Negative for joint swelling.  Skin: Negative for rash.  Neurological: Negative for headaches.  Hematological: Does not bruise/bleed easily.  Psychiatric/Behavioral: Negative for dysphoric mood. The patient is not nervous/anxious.        Objective:   Physical Exam    amb wm nad with classic voice fatigue.  Wt Readings from Last 3 Encounters:  03/27/13 129 lb (58.514 kg)  02/27/13 133 lb (60.328 kg)  02/17/13 133 lb 3.2 oz (60.419 kg)      HEENT mild turbinate edema.  Oropharynx no thrush or excess pnd or cobblestoning.  No JVD or cervical adenopathy. Mild accessory muscle hypertrophy. Trachea midline, nl thryroid. Chest was hyperinflated by percussion with diminished breath sounds and moderate increased exp time without wheeze. Hoover sign positive at mid inspiration. Regular rate and rhythm without murmur gallop or rub or increase P2 or edema.  Abd: no hsm, nl excursion. Ext warm without cyanosis or clubbing.      CT chest 03/10/13 1. No dominant pulmonary nodule or mass. 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  This likely accounted for the progressive plain film abnormality.  2. Centrilobular emphysema with lung nodules up to 7 mm. Per  consensus criteria, followup at 3- 6 months with chest CT is  recommended.     Assessment & Plan:

## 2013-03-27 NOTE — Patient Instructions (Addendum)
Stop lisinopril Benicar 20/12.5 one daily  Pantoprazole (protonix) 40 mg   Take 30-60 min before first meal of the day and Pepcid ac 20 mg one bedtime until return to office    The key is to stop smoking completely before smoking completely stops you!   Please schedule a follow up office visit in 4 weeks, sooner if needed with pfts

## 2013-03-27 NOTE — Assessment & Plan Note (Signed)

## 2013-03-27 NOTE — Assessment & Plan Note (Signed)
Discussed in detail all the  indications, usual  risks and alternatives  relative to the benefits with patient who agrees to proceed with   recall CT 09/11/13 (6 m per Radiology guidelines since also has nodule)

## 2013-03-28 ENCOUNTER — Encounter: Payer: Self-pay | Admitting: Internal Medicine

## 2013-03-30 ENCOUNTER — Telehealth: Payer: Self-pay | Admitting: Internal Medicine

## 2013-03-30 MED ORDER — BENZONATATE 200 MG PO CAPS: 200.0000 mg | ORAL_CAPSULE | ORAL | Status: DC | PRN

## 2013-03-30 NOTE — Telephone Encounter (Signed)
Pt saw MW 03/27/13: Patient Instructions      Stop lisinopril Benicar 20/12.5 one daily   Pantoprazole (protonix) 40 mg   Take 30-60 min before first meal of the day and Pepcid ac 20 mg one bedtime until return to office   The key is to stop smoking completely before smoking completely stops you!  Please schedule a follow up office visit in 4 weeks, sooner if needed with pfts    ----  Spoke with pt. He reports he has a deep dry cough and it makes him feel SOB. Per pt he stopped the lisinopril and began the benicar. He did begin taking the protonix and pepcid as well. He is asking to have something called in for his cough. Please advise MW thanks  No Known Allergies

## 2013-03-30 NOTE — Telephone Encounter (Signed)
Spoke with pt and aware of recs. Rx sent in. Nothing further needed

## 2013-03-30 NOTE — Telephone Encounter (Signed)
mucinex dm 1200 mg every 12 hours is the best  otc  We can call in tessalon 200 every 4 hours prn #30 and f/u in office if this is not adequate (narcotics can't be called in and would be the next in line to try)

## 2013-03-30 NOTE — Telephone Encounter (Signed)
Called spoke with the pharm. Clarified RX. Nothing further needed

## 2013-04-03 ENCOUNTER — Telehealth: Payer: Self-pay | Admitting: Internal Medicine

## 2013-04-03 NOTE — Telephone Encounter (Signed)
Called and spoke with pt and he stated that he is not able to get rid of this deep non productive cough.  He stated that he  Has has this cough  X 2-3 wks.  He was seen on 03/27/2013 by MW .  Pt denies any other symptoms.  Requesting that something be called in for him.  MW please advise. Thanks  No Known Allergies   Current Outpatient Prescriptions on File Prior to Visit  Medication Sig Dispense Refill  . Aclidinium Bromide (TUDORZA PRESSAIR) 400 MCG/ACT AEPB INHALE 1 PUFF BY MOUTH 2 TIMES A DAY.  1 each  5  . amLODipine (NORVASC) 10 MG tablet TAKE 1 TABLET (10 MG TOTAL) BY MOUTH DAILY.  90 tablet  3  . Ascorbic Acid (VITAMIN C) 250 MG tablet Take 500 mg by mouth daily.       Marland Kitchen aspirin 81 MG EC tablet Take 81 mg by mouth daily.        Marland Kitchen atorvastatin (LIPITOR) 40 MG tablet Take 1 tablet (40 mg total) by mouth daily.  90 tablet  3  . benzonatate (TESSALON) 200 MG capsule Take 1 capsule (200 mg total) by mouth every 4 (four) hours as needed for cough.  30 capsule  0  . dextromethorphan-guaiFENesin (MUCINEX DM) 30-600 MG per 12 hr tablet Take 2 tablets by mouth 2 (two) times daily as needed for cough.      . DOCUSATE SODIUM PO Take 1 tablet by mouth 2 (two) times daily.       . Ferrous Sulfate (IRON) 325 (65 FE) MG TABS Take 1 tablet by mouth 2 (two) times daily.  90 each  3  . Ipratropium-Albuterol (COMBIVENT RESPIMAT) 20-100 MCG/ACT AERS respimat Inhale 1 puff into the lungs 4 (four) times daily as needed for wheezing.  1 Inhaler  3  . Multiple Vitamins-Minerals (CENTRUM SILVER PO) Take by mouth daily.        Marland Kitchen olmesartan-hydrochlorothiazide (BENICAR HCT) 20-12.5 MG per tablet Take 1 tablet by mouth daily.      . pantoprazole (PROTONIX) 40 MG tablet Take 1 tablet (40 mg total) by mouth daily.  30 tablet  11   No current facility-administered medications on file prior to visit.

## 2013-04-03 NOTE — Telephone Encounter (Signed)
Called and spoke with pt and he is aware of appt with MW tomorrow .

## 2013-04-03 NOTE — Telephone Encounter (Signed)
Needs ov with all meds in hand see me or Tammy Np but nothing to suggest over the phone

## 2013-04-04 ENCOUNTER — Ambulatory Visit (INDEPENDENT_AMBULATORY_CARE_PROVIDER_SITE_OTHER): Payer: Medicare Other | Admitting: Internal Medicine

## 2013-04-04 ENCOUNTER — Encounter: Payer: Self-pay | Admitting: Internal Medicine

## 2013-04-04 VITALS — BP 128/60 | HR 69 | Temp 97.3°F | Ht 63.0 in | Wt 130.0 lb

## 2013-04-04 DIAGNOSIS — R059 Cough, unspecified: Secondary | ICD-10-CM | POA: Diagnosis not present

## 2013-04-04 DIAGNOSIS — R05 Cough: Secondary | ICD-10-CM

## 2013-04-04 DIAGNOSIS — I10 Essential (primary) hypertension: Secondary | ICD-10-CM

## 2013-04-04 MED ORDER — PREDNISONE 10 MG PO TABS: ORAL_TABLET | ORAL | Status: DC

## 2013-04-04 MED ORDER — TRAMADOL HCL 50 MG PO TABS: ORAL_TABLET | ORAL | Status: DC

## 2013-04-04 NOTE — Assessment & Plan Note (Signed)
Adequate control on present rx, reviewed > no change in rx needed   

## 2013-04-04 NOTE — Assessment & Plan Note (Signed)
-   trial off acei 03/27/2013 >>>  Way too early to get any benefit from acei elimination and in meantime appears to be caught in a cyclical cough pattern  Rec: eliminate cyclical cough / continue to hold acei/ f/u as prev planned  See instructions for specific recommendations which were reviewed directly with the patient who was given a copy with highlighter outlining the key components.

## 2013-04-04 NOTE — Progress Notes (Signed)
Subjective:    Patient ID: Ronald Bell, male    DOB: [birth year]  MRN: [id number]  HPI  67 yowm smoker from [place] with variable cough starting Fall on Nov 2014 referred 03/27/2013 by Dr Ronald Bell for abn CT c/w asbestosis   03/27/2013 1st [facility] office visit/ Ronald Bell still active smoker cc recurrent episodes of severe refractory cough this episode started x 3 weeks prior to OV   but comes and goes since early fall 2014 assoc with doe x  walking garbage to street x 3 years better up to several hours after combivent on tudorza bid miant as well.  Cough is variably prod of thick yellow mucus but min amt, mostly prod in am and dry rest of the day.  Has noct choking spells just about every night p lies down better if sits up and takes combivent - they feel almost  like something strangling him.  rec Stop lisinopril Benicar 20/12.5 one daily  Pantoprazole (protonix) 40 mg   Take 30-60 min before first meal of the day and Pepcid ac 20 mg one bedtime until return to office   The key is to stop smoking completely before smoking completely stops you!    04/04/2013  Acute  ov/Ronald Bell re: refractory cough  Chief Complaint  Patient presents with  . Acute Visit    Pt c/o increased cough since last visit. Cough is non prod and worse at night and early am. He states that he gets SOB with coughing spells and is using combivent 5 times daily. He states had episode of syncope last night and EMS was called.    really not sob unless coughing. No excess mucus, present 24/7   No obvious other patterns in  day to day or daytime variabilty or assoc chronic cough or cp or chest tightness, subjective wheeze overt sinus or hb symptoms. No unusual exp hx or h/o childhood pna/ asthma or knowledge of premature birth.  Sleeping ok without nocturnal  or early am exacerbation  of respiratory  c/o's or need for noct saba. Also denies any obvious fluctuation of symptoms with weather or environmental changes or other  aggravating or alleviating factors except as outlined above   Current Medications, Allergies, Complete Past Medical History, Past Surgical History, Family History, and Social History were reviewed in [employer] record.  ROS  The following are not active complaints unless bolded sore throat, dysphagia, dental problems, itching, sneezing,  nasal congestion or excess/ purulent secretions, ear ache,   fever, chills, sweats, unintended wt loss, pleuritic or exertional cp, hemoptysis,  orthopnea pnd or leg swelling, presyncope, palpitations, heartburn, abdominal pain, anorexia, nausea, vomiting, diarrhea  or change in bowel or urinary habits, change in stools or urine, dysuria,hematuria,  rash, arthralgias, visual complaints, headache, numbness weakness or ataxia or problems with walking or coordination,  change in mood/affect or memory.       .                Objective:   Physical Exam    amb wm nad with classic voice fatigue.  04/04/2013        130  Wt Readings from Last 3 Encounters:  03/27/13 129 lb (58.514 kg)  02/27/13 133 lb (60.328 kg)  02/17/13 133 lb 3.2 oz (60.419 kg)      HEENT mild turbinate edema.  Oropharynx no thrush or excess pnd or cobblestoning.  No JVD or cervical adenopathy. Mild accessory muscle hypertrophy. Trachea midline, nl thryroid.  Chest was hyperinflated by percussion with diminished breath sounds and moderate increased exp time without wheeze. Hoover sign positive at mid inspiration. Regular rate and rhythm without murmur gallop or rub or increase P2 or edema.  Abd: no hsm, nl excursion. Ext warm without cyanosis or clubbing.      CT chest 03/10/13 1. No dominant pulmonary nodule or mass. 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  This likely accounted for the progressive plain film abnormality.  2. Centrilobular emphysema with lung nodules up to 7 mm. Per  consensus criteria,  followup at 3- 6 months with chest CT is  recommended.     Assessment & Plan:

## 2013-04-04 NOTE — Patient Instructions (Addendum)
Stop tudorza  Prednisone 10 mg take  4 each am x 2 days,   2 each am x 2 days,  1 each am x 2 days and stop   mucinex dm 1200 twice daily as long as you feel the urge to cough and supplement it with Tramadol 50 up to 2 to every 4 hours- once the cough is gone back off of it.  Prednisone 10 mg take  4 each am x 2 days,   2 each am x 2 days,  1 each am x 2 days and stop   Pantoprazole (protonix) 40 mg   Take 30-60 min before first meal of the day and Pepcid 20 mg one bedtime until return to office - this is the best way to tell whether stomach acid is contributing to your problem.    GERD (REFLUX)  is an extremely common cause of respiratory symptoms, many times with no significant heartburn at all.    It can be treated with medication, but also with lifestyle changes including avoidance of late meals, excessive alcohol, smoking cessation, and avoid fatty foods, chocolate, peppermint, colas, red wine, and acidic juices such as orange juice.  NO MINT OR MENTHOL PRODUCTS SO NO COUGH DROPS  USE SUGARLESS CANDY INSTEAD (jolley ranchers or Stover's)  NO OIL BASED VITAMINS - use powdered substitutes.    Keep previous appt but call in meantime if not improving

## 2013-04-07 ENCOUNTER — Institutional Professional Consult (permissible substitution): Payer: Medicare Other | Admitting: Emergency Medicine

## 2013-04-13 ENCOUNTER — Telehealth: Payer: Self-pay | Admitting: Internal Medicine

## 2013-04-13 MED ORDER — OLMESARTAN MEDOXOMIL-HCTZ 20-12.5 MG PO TABS: 1.0000 | ORAL_TABLET | Freq: Every day | ORAL | Status: DC

## 2013-04-13 NOTE — Telephone Encounter (Signed)
Spoke with pt. Requesting sample of his benicar HCT. I have left upfront for pick up. Nothing further needed

## 2013-04-27 ENCOUNTER — Other Ambulatory Visit: Payer: Self-pay | Admitting: Internal Medicine

## 2013-04-27 DIAGNOSIS — J449 Chronic obstructive pulmonary disease, unspecified: Secondary | ICD-10-CM

## 2013-04-28 ENCOUNTER — Ambulatory Visit (INDEPENDENT_AMBULATORY_CARE_PROVIDER_SITE_OTHER): Payer: Medicare Other | Admitting: Internal Medicine

## 2013-04-28 ENCOUNTER — Encounter: Payer: Self-pay | Admitting: Internal Medicine

## 2013-04-28 VITALS — BP 104/70 | HR 86 | Temp 97.6°F | Ht 62.5 in | Wt 128.0 lb

## 2013-04-28 DIAGNOSIS — J449 Chronic obstructive pulmonary disease, unspecified: Secondary | ICD-10-CM

## 2013-04-28 DIAGNOSIS — F17200 Nicotine dependence, unspecified, uncomplicated: Secondary | ICD-10-CM

## 2013-04-28 DIAGNOSIS — R05 Cough: Secondary | ICD-10-CM

## 2013-04-28 DIAGNOSIS — R091 Pleurisy: Secondary | ICD-10-CM

## 2013-04-28 DIAGNOSIS — R059 Cough, unspecified: Secondary | ICD-10-CM

## 2013-04-28 DIAGNOSIS — I10 Essential (primary) hypertension: Secondary | ICD-10-CM

## 2013-04-28 DIAGNOSIS — J920 Pleural plaque with presence of asbestos: Secondary | ICD-10-CM

## 2013-04-28 DIAGNOSIS — T65891A Toxic effect of other specified substances, accidental (unintentional), initial encounter: Secondary | ICD-10-CM

## 2013-04-28 LAB — PULMONARY FUNCTION TEST
DL/VA % pred: 54 %
DL/VA: 2.19 ml/min/mmHg/L
DLCO unc % pred: 45 %
DLCO unc: 10.15 ml/min/mmHg
FEF 25-75 Post: 0.43 L/sec
FEF 25-75 Pre: 0.32 L/sec
FEF2575-%Change-Post: 32 %
FEF2575-%PRED-PRE: 20 %
FEF2575-%Pred-Post: 26 %
FEV1-%Change-Post: 14 %
FEV1-%Pred-Post: 46 %
FEV1-%Pred-Pre: 40 %
FEV1-POST: 1.02 L
FEV1-Pre: 0.88 L
FEV1FVC-%CHANGE-POST: 5 %
FEV1FVC-%Pred-Pre: 60 %
FEV6-%Change-Post: 8 %
FEV6-%Pred-Post: 71 %
FEV6-%Pred-Pre: 66 %
FEV6-POST: 2.03 L
FEV6-PRE: 1.88 L
FEV6FVC-%CHANGE-POST: 0 %
FEV6FVC-%Pred-Post: 102 %
FEV6FVC-%Pred-Pre: 102 %
FVC-%Change-Post: 8 %
FVC-%PRED-POST: 71 %
FVC-%PRED-PRE: 65 %
FVC-PRE: 2 L
FVC-Post: 2.17 L
POST FEV6/FVC RATIO: 94 %
PRE FEV6/FVC RATIO: 94 %
Post FEV1/FVC ratio: 47 %
Pre FEV1/FVC ratio: 44 %
RV % pred: 159 %
RV: 3.4 L
TLC % PRED: 104 %
TLC: 5.8 L

## 2013-04-28 MED ORDER — FLUTICASONE FUROATE-VILANTEROL 100-25 MCG/INH IN AEPB: 1.0000 | INHALATION_SPRAY | Freq: Every morning | RESPIRATORY_TRACT | Status: DC

## 2013-04-28 MED ORDER — VALSARTAN-HYDROCHLOROTHIAZIDE 160-12.5 MG PO TABS: 1.0000 | ORAL_TABLET | Freq: Every day | ORAL | Status: DC

## 2013-04-28 NOTE — Patient Instructions (Addendum)
Finish Barrister's clerk and then start diovan 160 - 12.5 one daily   Ok to stop pepcid to see what effect this has on your cough and if worse restart, if no change in symptoms x sev weeks then  stop protonix too to see if really need it  Start breo open it once  each am (x2 drags to be sure you get it all)  Only use your combivent  as a rescue medication to be used if you can't catch your breath by resting or doing a relaxed purse lip breathing pattern.  - The less you use it, the better it will work when you need it. - Ok to use up to 2 puffs  every 4 hours if you must but call for immediate appointment if use goes up over your usual need - Don't leave home without it !!  (think of it like the spare tire for your car)   Please schedule a follow up office visit in 6 weeks, call sooner if needed

## 2013-04-28 NOTE — Progress Notes (Signed)
Subjective:    Patient ID: Ronald Bell, male    DOB: 07-28-1940  MRN: [id number]  Brief patient profile:  45 yowm [place]oker from [place] with variable cough starting Fall on Nov 2014 referred 03/27/2013 by Dr Etter Sjogren for abn CT c/w asbestosis  History of Present Illness  03/27/2013 1st [facility] office visit/ [name] still active smoker cc recurrent episodes of severe refractory cough this episode started x 3 weeks prior to OV   but comes and goes since early fall 2014 assoc with doe x  walking garbage to street x 3 years better up to several hours after combivent on tudorza bid miant as well.  Cough is variably prod of thick yellow mucus but min amt, mostly prod in am and dry rest of the day.  Has noct choking spells just about every night p lies down better if sits up and takes combivent - they feel almost  like something strangling him.  rec Stop lisinopril Benicar 20/12.5 one daily  Pantoprazole (protonix) 40 mg   Take 30-60 min before first meal of the day and Pepcid ac 20 mg one bedtime until return to office   The key is to stop smoking completely before smoking completely stops you!    04/04/2013  Acute  ov/[name] re: refractory cough  Chief Complaint  Patient presents with  . Acute Visit    Pt c/o increased cough since last visit. Cough is non prod and worse at night and early am. He states that he gets SOB with coughing spells and is using combivent 5 times daily. He states had episode of syncope last night and EMS was called.    really not sob unless coughing. No excess mucus, present 24/7  rec Stop tudorza Prednisone 10 mg take  4 each am x 2 days,   2 each am x 2 days,  1 each am x 2 days and stop  mucinex dm 1200 twice daily as long as you feel the urge to cough and supplement it with Tramadol 50 up to 2 to every 4 hours- once the cough is gone back off of it. Prednisone 10 mg take  4 each am x 2 days,   2 each am x 2 days,  1 each am x 2 days and stop  Pantoprazole (protonix)  40 mg   Take 30-60 min before first meal of the day and Pepcid 20 mg one bedtime until return to office - this is the best way to tell whether stomach acid is contributing to your problem.   GERD diet   04/28/2013 f/u ov/[name] re: GOLD III COPD / cough much better off ACEi Chief Complaint  Patient presents with  . Follow-up    Pt states breathing is slightly improved and cough is much improved. He is using combivent approx 4 times per day.   doe x min activity typically using combivent   4 x 24 h including occ wakes up and uses it about 3 am      No obvious other patterns in  day to day or daytime variabilty or assoc excess mucus or    cp or chest tightness, subjective wheeze overt sinus or hb symptoms. No unusual exp hx or h/o childhood pna/ asthma or knowledge of premature birth.  Sleeping ok without nocturnal  or early am exacerbation  of respiratory  c/o's or need for noct saba. Also denies any obvious fluctuation of symptoms with weather or environmental changes or other aggravating or alleviating  factors except as outlined above   Current Medications, Allergies, Complete Past Medical History, Past Surgical History, Family History, and Social History were reviewed in [employer] record.  ROS  The following are not active complaints unless bolded sore throat, dysphagia, dental problems, itching, sneezing,  nasal congestion or excess/ purulent secretions, ear ache,   fever, chills, sweats, unintended wt loss, pleuritic or exertional cp, hemoptysis,  orthopnea pnd or leg swelling, presyncope, palpitations, heartburn, abdominal pain, anorexia, nausea, vomiting, diarrhea  or change in bowel or urinary habits, change in stools or urine, dysuria,hematuria,  rash, arthralgias, visual complaints, headache, numbness weakness or ataxia or problems with walking or coordination,  change in mood/affect or memory.       .                Objective:   Physical Exam    amb wm  nad    04/04/2013        130  Vs 128 04/28/2013  Wt Readings from Last 3 Encounters:  03/27/13 129 lb (58.514 kg)  02/27/13 133 lb (60.328 kg)  02/17/13 133 lb 3.2 oz (60.419 kg)      HEENT mild turbinate edema.  Oropharynx no thrush or excess pnd or cobblestoning.  No JVD or cervical adenopathy. Mild accessory muscle hypertrophy. Trachea midline, nl thryroid. Chest was hyperinflated by percussion with diminished breath sounds and moderate increased exp time without wheeze. Hoover sign positive at mid inspiration. Regular rate and rhythm without murmur gallop or rub or increase P2 or edema.  Abd: no hsm, nl excursion. Ext warm without cyanosis or clubbing.      CT chest 03/10/13 1. No dominant pulmonary nodule or mass. 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  This likely accounted for the progressive plain film abnormality.  2. Centrilobular emphysema with lung nodules up to 7 mm. Per  consensus criteria, followup at 3- 6 months with chest CT is  recommended.     Assessment & Plan:

## 2013-04-28 NOTE — Progress Notes (Signed)
PFT done today. 

## 2013-04-29 NOTE — Assessment & Plan Note (Signed)
-   trial off acei 03/27/2013 >>> resolved 04/28/13

## 2013-04-29 NOTE — Assessment & Plan Note (Signed)

## 2013-04-29 NOTE — Assessment & Plan Note (Signed)
-   F/u CT in tickle file for recall CT 09/11/13 (6 m per Radiology guidelines since also has nodule)  Given asbestosis likely present the finding of MPN is also very worrisome in that any of these lesions could evolve to a malignancy but clealry not a surgical candidate  Discussed in detail all the  indications, usual  risks and alternatives  relative to the benefits with patient who agrees to proceed with conservative f/u and try to reduce the main controllable risk factor (smoking)

## 2013-04-29 NOTE — Assessment & Plan Note (Signed)
-   PFT's 04/28/2013  FEV1 0.88 (40%) with ratio 44 and 14% better p B2 dlco 45 corrects to 83  I had an extended discussion with the patient today lasting 15 to 20 minutes of a 25 minute visit on the following issues:  He has severe copd and still smoking but not willing to commit to quit at this point.  Overusing combivent  rec trial of breo one daily (other option anoro) for Class C/D symptoms     Each maintenance medication was reviewed in detail including most importantly the difference between maintenance and as needed and under what circumstances the prns are to be used.  Please see instructions for details which were reviewed in writing and the patient given a copy.

## 2013-04-29 NOTE — Assessment & Plan Note (Addendum)
Trial off acei due to cough 03/27/2013 > resolved 04/28/13 so rec maintain off acei indefinitely   Ok control on diovan160/12.5

## 2013-05-09 ENCOUNTER — Other Ambulatory Visit: Payer: Self-pay | Admitting: Vascular Surgery

## 2013-05-09 DIAGNOSIS — I6529 Occlusion and stenosis of unspecified carotid artery: Secondary | ICD-10-CM

## 2013-06-01 ENCOUNTER — Other Ambulatory Visit: Payer: Self-pay | Admitting: Family Medicine

## 2013-06-05 ENCOUNTER — Telehealth: Payer: Self-pay | Admitting: Internal Medicine

## 2013-06-05 MED ORDER — FLUTICASONE FUROATE-VILANTEROL 100-25 MCG/INH IN AEPB: 1.0000 | INHALATION_SPRAY | Freq: Every morning | RESPIRATORY_TRACT | Status: DC

## 2013-06-05 NOTE — Telephone Encounter (Signed)
lmtcb x1 

## 2013-06-05 NOTE — Telephone Encounter (Signed)
PT requesting sample of breo. This has been left for pick up

## 2013-06-06 NOTE — Telephone Encounter (Signed)
Called and spoke with pt. He wanted to make sure sample was left for pick up. Nothing further needed

## 2013-06-06 NOTE — Telephone Encounter (Signed)
Pt returned call & asks to be reached at [phone] ext. [phone].  Satira Anis

## 2013-06-06 NOTE — Telephone Encounter (Signed)
lmtcb x2 w/ spouse

## 2013-06-09 ENCOUNTER — Encounter: Payer: Self-pay | Admitting: Internal Medicine

## 2013-06-09 ENCOUNTER — Ambulatory Visit (INDEPENDENT_AMBULATORY_CARE_PROVIDER_SITE_OTHER): Payer: Medicare Other | Admitting: Internal Medicine

## 2013-06-09 VITALS — BP 130/58 | HR 78 | Temp 97.6°F | Ht 63.0 in | Wt 133.0 lb

## 2013-06-09 DIAGNOSIS — I6529 Occlusion and stenosis of unspecified carotid artery: Secondary | ICD-10-CM

## 2013-06-09 DIAGNOSIS — R091 Pleurisy: Secondary | ICD-10-CM

## 2013-06-09 DIAGNOSIS — R059 Cough, unspecified: Secondary | ICD-10-CM | POA: Diagnosis not present

## 2013-06-09 DIAGNOSIS — T65891A Toxic effect of other specified substances, accidental (unintentional), initial encounter: Secondary | ICD-10-CM

## 2013-06-09 DIAGNOSIS — J920 Pleural plaque with presence of asbestos: Secondary | ICD-10-CM

## 2013-06-09 DIAGNOSIS — J449 Chronic obstructive pulmonary disease, unspecified: Secondary | ICD-10-CM | POA: Diagnosis not present

## 2013-06-09 DIAGNOSIS — R05 Cough: Secondary | ICD-10-CM

## 2013-06-09 DIAGNOSIS — F17200 Nicotine dependence, unspecified, uncomplicated: Secondary | ICD-10-CM | POA: Diagnosis not present

## 2013-06-09 MED ORDER — FLUTICASONE FUROATE-VILANTEROL 100-25 MCG/INH IN AEPB: 1.0000 | INHALATION_SPRAY | Freq: Every morning | RESPIRATORY_TRACT | Status: DC

## 2013-06-09 NOTE — Patient Instructions (Addendum)
No change in medications  The key is to stop smoking completely before smoking completely stops you - it's not too late!!  Follow up in last week in June 2015 - we will call in meantime for CT 09/11/13

## 2013-06-09 NOTE — Assessment & Plan Note (Signed)
-   trial off acei 03/27/2013 >>> resolved 04/28/13   Therefore rec maintain off cigs

## 2013-06-09 NOTE — Progress Notes (Signed)
Subjective:    Patient ID: Ronald Bell, male    DOB: 02/26/1941  MRN: [id number]  Brief patient profile:  59 yowm smoker from [place] with variable cough starting Fall on Nov 2014 referred 03/27/2013 by Dr Etter Sjogren for abn CT c/w asbestosis with GOLD III copd with min  reversibility by pfts 04/28/13 and ? acei cough   History of Present Illness  03/27/2013 1st [facility] office visit/ Ronald Bell still active smoker cc recurrent episodes of severe refractory cough this episode started x 3 weeks prior to OV   but comes and goes since early fall 2014 assoc with doe x  walking garbage to street x 3 years better up to several hours after combivent on tudorza bid miant as well.  Cough is variably prod of thick yellow mucus but min amt, mostly prod in am and dry rest of the day.  Has noct choking spells just about every night p lies down better if sits up and takes combivent - they feel almost  like something strangling him.  rec Stop lisinopril Benicar 20/12.5 one daily  Pantoprazole (protonix) 40 mg   Take 30-60 min before first meal of the day and Pepcid ac 20 mg one bedtime until return to office   The key is to stop smoking completely before smoking completely stops you!    04/04/2013  Acute  ov/Ronald Bell re: refractory cough  Chief Complaint  Patient presents with  . Acute Visit    Pt c/o increased cough since last visit. Cough is non prod and worse at night and early am. He states that he gets SOB with coughing spells and is using combivent 5 times daily. He states had episode of syncope last night and EMS was called.    really not sob unless coughing. No excess mucus, present 24/7  rec Stop tudorza Prednisone 10 mg take  4 each am x 2 days,   2 each am x 2 days,  1 each am x 2 days and stop  mucinex dm 1200 twice daily as long as you feel the urge to cough and supplement it with Tramadol 50 up to 2 to every 4 hours- once the cough is gone back off of it. Prednisone 10 mg take  4 each am x 2 days,    2 each am x 2 days,  1 each am x 2 days and stop  Pantoprazole (protonix) 40 mg   Take 30-60 min before first meal of the day and Pepcid 20 mg one bedtime until return to office - this is the best way to tell whether stomach acid is contributing to your problem.   GERD diet   04/28/2013 f/u ov/Ronald Bell re: GOLD III COPD / cough much better off ACEi Chief Complaint  Patient presents with  . Follow-up    Pt states breathing is slightly improved and cough is much improved. He is using combivent approx 4 times per day.   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 Follow-up    Pt states breathing is much improved with taking breo. He feels he has more energy. He is using rescue inhaler much less- maybe 4 x per wk.      Not limited by breathing from desired activities    No obvious other patterns in  day to day or daytime variabilty or assoc excess mucus or    cp or chest tightness, subjective wheeze overt sinus or hb symptoms. No unusual exp hx or h/o childhood pna/ asthma or knowledge of premature birth.  Sleeping ok without nocturnal  or early am exacerbation  of respiratory  c/o's or need for noct saba. Also denies any obvious fluctuation of symptoms with weather or environmental changes or other aggravating or alleviating factors except as outlined above   Current Medications, Allergies, Complete Past Medical History, Past Surgical History, Family History, and Social  History were reviewed in [employer] record.  ROS  The following are not active complaints unless bolded sore throat, dysphagia, dental problems, itching, sneezing,  nasal congestion or excess/ purulent secretions, ear ache,   fever, chills, sweats, unintended wt loss, pleuritic or exertional cp, hemoptysis,  orthopnea pnd or leg swelling, presyncope, palpitations, heartburn, abdominal pain, anorexia, nausea, vomiting, diarrhea  or change in bowel or urinary habits, change in stools or urine, dysuria,hematuria,  rash, arthralgias, visual complaints, headache, numbness weakness or ataxia or problems with walking or coordination,  change in mood/affect or memory.       .                Objective:   Physical Exam    amb wm nad    Wt Readings from Last 3 Encounters:  06/09/13 133 lb (60.328 kg)  04/28/13 128 lb (58.06 kg)  04/04/13 130 lb (58.968 kg)     HEENT mild turbinate edema.  Oropharynx no thrush or excess pnd or cobblestoning.  No JVD or cervical adenopathy. Mild accessory muscle hypertrophy. Trachea midline, nl thryroid. Chest was hyperinflated by percussion with diminished breath sounds and moderate increased exp time without wheeze. Hoover sign positive at mid inspiration. Regular rate and rhythm without murmur gallop or rub or increase P2 or edema.  Abd: no hsm, nl excursion. Ext warm without cyanosis or clubbing.      CT chest 03/10/13 1. No dominant pulmonary nodule or mass. 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  This likely accounted for the progressive plain film abnormality.  2. Centrilobular emphysema with lung nodules up to 7 mm. Per  consensus criteria, followup at 3- 6 months with chest CT is  recommended.     Assessment & Plan:

## 2013-06-09 NOTE — Assessment & Plan Note (Signed)
-   PFT's 04/28/2013  FEV1 0.88 (40%) with ratio 44 and 14% better p B2 dlco 45 corrects to 83 - Trial of breo 04/28/2013 > improved  Adequate control on present rx despite smoking , reviewed > no change in rx needed but need to quit now

## 2013-06-09 NOTE — Assessment & Plan Note (Signed)
>   3 m  I reviewed the Flethcher curve with patient that basically indicates  if you quit smoking when your best day FEV1 is still well preserved it is highly unlikely you will progress to severe disease and informed the patient there was no medication on the market that has proven to change the curve or the likelihood of progression.  Therefore stopping smoking and maintaining abstinence is the most important aspect of care, not choice of inhalers or for that matter, doctors.   

## 2013-06-09 NOTE — Assessment & Plan Note (Signed)
-   F/u CT in tickle file for recall CT 09/11/13 (6 m per Radiology guidelines since also has nodule)

## 2013-08-17 ENCOUNTER — Ambulatory Visit: Payer: No Typology Code available for payment source | Admitting: Family Medicine

## 2013-08-21 ENCOUNTER — Encounter: Payer: Self-pay | Admitting: Family Medicine

## 2013-08-21 ENCOUNTER — Telehealth: Payer: Self-pay | Admitting: Family Medicine

## 2013-08-21 ENCOUNTER — Ambulatory Visit (INDEPENDENT_AMBULATORY_CARE_PROVIDER_SITE_OTHER): Payer: Medicare Other | Admitting: Family Medicine

## 2013-08-21 VITALS — BP 140/60 | HR 90 | Temp 98.3°F | Wt 130.0 lb

## 2013-08-21 DIAGNOSIS — M19019 Primary osteoarthritis, unspecified shoulder: Secondary | ICD-10-CM

## 2013-08-21 DIAGNOSIS — K219 Gastro-esophageal reflux disease without esophagitis: Secondary | ICD-10-CM

## 2013-08-21 DIAGNOSIS — L259 Unspecified contact dermatitis, unspecified cause: Secondary | ICD-10-CM | POA: Diagnosis not present

## 2013-08-21 DIAGNOSIS — I10 Essential (primary) hypertension: Secondary | ICD-10-CM | POA: Diagnosis not present

## 2013-08-21 DIAGNOSIS — J449 Chronic obstructive pulmonary disease, unspecified: Secondary | ICD-10-CM

## 2013-08-21 DIAGNOSIS — E785 Hyperlipidemia, unspecified: Secondary | ICD-10-CM

## 2013-08-21 DIAGNOSIS — I6529 Occlusion and stenosis of unspecified carotid artery: Secondary | ICD-10-CM

## 2013-08-21 DIAGNOSIS — L309 Dermatitis, unspecified: Secondary | ICD-10-CM

## 2013-08-21 LAB — BASIC METABOLIC PANEL
BUN: 12 mg/dL (ref 6–23)
CO2: 26 mEq/L (ref 19–32)
Calcium: 9.8 mg/dL (ref 8.4–10.5)
Chloride: 100 mEq/L (ref 96–112)
Creatinine, Ser: 0.7 mg/dL (ref 0.4–1.5)
GFR: 109.59 mL/min (ref >60.00)
GLUCOSE: 85 mg/dL (ref 70–99)
POTASSIUM: 4.2 meq/L (ref 3.5–5.1)
SODIUM: 135 meq/L (ref 135–145)

## 2013-08-21 LAB — LIPID PANEL
Cholesterol: 145 mg/dL (ref 0–200)
HDL: 48.1 mg/dL (ref >39.00)
LDL CALC: 76 mg/dL (ref 0–99)
Total CHOL/HDL Ratio: 3
Triglycerides: 107 mg/dL (ref 0.0–149.0)
VLDL: 21.4 mg/dL (ref 0.0–40.0)

## 2013-08-21 LAB — HEPATIC FUNCTION PANEL
ALT: 35 U/L (ref 0–53)
AST: 24 U/L (ref 0–37)
Albumin: 4.1 g/dL (ref 3.5–5.2)
Alkaline Phosphatase: 69 U/L (ref 39–117)
BILIRUBIN DIRECT: 0 mg/dL (ref 0.0–0.3)
Total Bilirubin: 0.6 mg/dL (ref 0.2–1.2)
Total Protein: 7.3 g/dL (ref 6.0–8.3)

## 2013-08-21 MED ORDER — DICLOFENAC SODIUM 1 % TD GEL: 2.0000 g | Freq: Four times a day (QID) | TRANSDERMAL | Status: DC

## 2013-08-21 MED ORDER — VALSARTAN-HYDROCHLOROTHIAZIDE 160-12.5 MG PO TABS: 1.0000 | ORAL_TABLET | Freq: Every day | ORAL | Status: DC

## 2013-08-21 MED ORDER — IPRATROPIUM-ALBUTEROL 20-100 MCG/ACT IN AERS: 1.0000 | INHALATION_SPRAY | Freq: Four times a day (QID) | RESPIRATORY_TRACT | Status: DC | PRN

## 2013-08-21 MED ORDER — ATORVASTATIN CALCIUM 40 MG PO TABS: 40.0000 mg | ORAL_TABLET | Freq: Every day | ORAL | Status: DC

## 2013-08-21 MED ORDER — FLUTICASONE FUROATE-VILANTEROL 100-25 MCG/INH IN AEPB: 1.0000 | INHALATION_SPRAY | Freq: Every morning | RESPIRATORY_TRACT | Status: DC

## 2013-08-21 MED ORDER — AMLODIPINE BESYLATE 10 MG PO TABS: ORAL_TABLET | ORAL | Status: DC

## 2013-08-21 MED ORDER — MOMETASONE FUROATE 0.1 % EX CREA: 1.0000 "application " | TOPICAL_CREAM | Freq: Every day | CUTANEOUS | Status: DC

## 2013-08-21 MED ORDER — PANTOPRAZOLE SODIUM 40 MG PO TBEC: DELAYED_RELEASE_TABLET | ORAL | Status: DC

## 2013-08-21 NOTE — Progress Notes (Signed)
Pre visit review using our clinic review tool, if applicable. No additional management support is needed unless otherwise documented below in the visit note. 

## 2013-08-21 NOTE — Patient Instructions (Signed)
Smoking Cessation Quitting smoking is important to your health and has many advantages. However, it is not always easy to quit since nicotine is a very addictive drug. Often times, people try 3 times or more before being able to quit. This document explains the best ways for you to prepare to quit smoking. Quitting takes hard work and a lot of effort, but you can do it. ADVANTAGES OF QUITTING SMOKING  You will live longer, feel better, and live better.  Your body will feel the impact of quitting smoking almost immediately.  Within 20 minutes, blood pressure decreases. Your pulse returns to its normal level.  After 8 hours, carbon monoxide levels in the blood return to normal. Your oxygen level increases.  After 24 hours, the chance of having a heart attack starts to decrease. Your breath, hair, and body stop smelling like smoke.  After 48 hours, damaged nerve endings begin to recover. Your sense of taste and smell improve.  After 72 hours, the body is virtually free of nicotine. Your bronchial tubes relax and breathing becomes easier.  After 2 to 12 weeks, lungs can hold more air. Exercise becomes easier and circulation improves.  The risk of having a heart attack, stroke, cancer, or lung disease is greatly reduced.  After 1 year, the risk of coronary heart disease is cut in half.  After 5 years, the risk of stroke falls to the same as a nonsmoker.  After 10 years, the risk of lung cancer is cut in half and the risk of other cancers decreases significantly.  After 15 years, the risk of coronary heart disease drops, usually to the level of a nonsmoker.  If you are pregnant, quitting smoking will improve your chances of having a healthy baby.  The people you live with, especially any children, will be healthier.  You will have extra money to spend on things other than cigarettes. QUESTIONS TO THINK ABOUT BEFORE ATTEMPTING TO QUIT You may want to talk about your answers with your  caregiver.  Why do you want to quit?  If you tried to quit in the past, what helped and what did not?  What will be the most difficult situations for you after you quit? How will you plan to handle them?  Who can help you through the tough times? Your family? Friends? A caregiver?  What pleasures do you get from smoking? What ways can you still get pleasure if you quit? Here are some questions to ask your caregiver:  How can you help me to be successful at quitting?  What medicine do you think would be best for me and how should I take it?  What should I do if I need more help?  What is smoking withdrawal like? How can I get information on withdrawal? GET READY  Set a quit date.  Change your environment by getting rid of all cigarettes, ashtrays, matches, and lighters in your home, car, or work. Do not let people smoke in your home.  Review your past attempts to quit. Think about what worked and what did not. GET SUPPORT AND ENCOURAGEMENT You have a better chance of being successful if you have help. You can get support in many ways.  Tell your family, friends, and co-workers that you are going to quit and need their support. Ask them not to smoke around you.  Get individual, group, or telephone counseling and support. Programs are available at local hospitals and health centers. Call your local health department for   information about programs in your area.  Spiritual beliefs and practices may help some smokers quit.  Download a "quit meter" on your computer to keep track of quit statistics, such as how long you have gone without smoking, cigarettes not smoked, and money saved.  Get a self-help book about quitting smoking and staying off of tobacco. LEARN NEW SKILLS AND BEHAVIORS  Distract yourself from urges to smoke. Talk to someone, go for a walk, or occupy your time with a task.  Change your normal routine. Take a different route to work. Drink tea instead of coffee.  Eat breakfast in a different place.  Reduce your stress. Take a hot bath, exercise, or read a book.  Plan something enjoyable to do every day. Reward yourself for not smoking.  Explore interactive web-based programs that specialize in helping you quit. GET MEDICINE AND USE IT CORRECTLY Medicines can help you stop smoking and decrease the urge to smoke. Combining medicine with the above behavioral methods and support can greatly increase your chances of successfully quitting smoking.  Nicotine replacement therapy helps deliver nicotine to your body without the negative effects and risks of smoking. Nicotine replacement therapy includes nicotine gum, lozenges, inhalers, nasal sprays, and skin patches. Some may be available over-the-counter and others require a prescription.  Antidepressant medicine helps people abstain from smoking, but how this works is unknown. This medicine is available by prescription.  Nicotinic receptor partial agonist medicine simulates the effect of nicotine in your brain. This medicine is available by prescription. Ask your caregiver for advice about which medicines to use and how to use them based on your health history. Your caregiver will tell you what side effects to look out for if you choose to be on a medicine or therapy. Carefully read the information on the package. Do not use any other product containing nicotine while using a nicotine replacement product.  RELAPSE OR DIFFICULT SITUATIONS Most relapses occur within the first 3 months after quitting. Do not be discouraged if you start smoking again. Remember, most people try several times before finally quitting. You may have symptoms of withdrawal because your body is used to nicotine. You may crave cigarettes, be irritable, feel very hungry, cough often, get headaches, or have difficulty concentrating. The withdrawal symptoms are only temporary. They are strongest when you first quit, but they will go away within  10 14 days. To reduce the chances of relapse, try to:  Avoid drinking alcohol. Drinking lowers your chances of successfully quitting.  Reduce the amount of caffeine you consume. Once you quit smoking, the amount of caffeine in your body increases and can give you symptoms, such as a rapid heartbeat, sweating, and anxiety.  Avoid smokers because they can make you want to smoke.  Do not let weight gain distract you. Many smokers will gain weight when they quit, usually less than 10 pounds. Eat a healthy diet and stay active. You can always lose the weight gained after you quit.  Find ways to improve your mood other than smoking. FOR MORE INFORMATION  www.smokefree.gov  Document Released: 03/03/2001 Document Revised: 09/08/2011 Document Reviewed: 06/18/2011 Exit[employer] Patient Information 2014 [employer].  

## 2013-08-21 NOTE — Progress Notes (Signed)
  Subjective:    Patient here for follow-up of elevated blood pressure.  He is exercising and is adherent to a low-salt diet.  Blood pressure is well controlled at home. Cardiac symptoms: none. Patient denies: chest pain, chest pressure/discomfort, claudication, exertional chest pressure/discomfort, fatigue, irregular heart beat, lower extremity edema, near-syncope, palpitations and syncope. Cardiovascular risk factors: advanced age (older than 40 for men, 50 for women), dyslipidemia, hypertension, male gender and smoking/ tobacco exposure. Use of agents associated with hypertension: none. History of target organ damage: none.   Pt dx with asbestosis by pulmonary  The following portions of the patient's history were reviewed and updated as appropriate: allergies, current medications, past family history, past medical history, past social history, past surgical history and problem list.  Review of Systems Pertinent items are noted in HPI.     Objective:    BP 140/60  Pulse 90  Temp(Src) 98.3 F (36.8 C) (Oral)  Wt 130 lb (58.968 kg)  SpO2 93% General appearance: alert, cooperative, appears stated age and no distress Throat: lips, mucosa, and tongue normal; teeth and gums normal Neck: no adenopathy, supple, symmetrical, trachea midline and thyroid not enlarged, symmetric, no tenderness/mass/nodules Lungs: diminished breath sounds bilaterally Heart: S1, S2 normal Extremities: extremities normal, atraumatic, no cyanosis or edema    Assessment:    Hypertension, normal blood pressure . Evidence of target organ damage: none.    Plan:    Medication: no change. Dietary sodium restriction. Regular aerobic exercise. Check blood pressures 2-3 times weekly and record. Follow up: 6 months and as needed. ---for cpe  1. Eczema  - mometasone (ELOCON) 0.1 % cream; Apply 1 application topically daily.  Dispense: 45 g; Refill: 3  2. Osteoarthritis, shoulder  - diclofenac sodium (VOLTAREN) 1 %  GEL; Apply 2 g topically 4 (four) times daily.  Dispense: 100 g; Refill: 5  3. HTN (hypertension) stable - amLODipine (NORVASC) 10 MG tablet; TAKE 1 TABLET (10 MG TOTAL) BY MOUTH DAILY.  Dispense: 90 tablet; Refill: 3 - Basic metabolic panel - valsartan-hydrochlorothiazide (DIOVAN HCT) 160-12.5 MG per tablet; Take 1 tablet by mouth daily.  Dispense: 90 tablet; Refill: 3  4. Other and unspecified hyperlipidemia Check labs - atorvastatin (LIPITOR) 40 MG tablet; Take 1 tablet (40 mg total) by mouth daily.  Dispense: 90 tablet; Refill: 3 - Hepatic function panel - Lipid panel  5. COPD (chronic obstructive pulmonary disease) Per pulm - Ipratropium-Albuterol (COMBIVENT RESPIMAT) 20-100 MCG/ACT AERS respimat; Inhale 1 puff into the lungs 4 (four) times daily as needed for wheezing.  Dispense: 1 Inhaler; Refill: 3 - Fluticasone Furoate-Vilanterol (BREO ELLIPTA) 100-25 MCG/INH AEPB; Inhale 1 puff into the lungs every morning.  Dispense: 28 each; Refill: 11  6. GERD (gastroesophageal reflux disease) Refill meds--- f/u GI for colonoscopy - pantoprazole (PROTONIX) 40 MG tablet; TAKE ONE TABLET BY MOUTH ONCE DAILY  Dispense: 90 tablet; Refill: 3

## 2013-08-21 NOTE — Telephone Encounter (Signed)
Relevant patient education mailed to patient.  

## 2013-08-28 ENCOUNTER — Encounter: Payer: Self-pay | Admitting: Internal Medicine

## 2013-08-28 ENCOUNTER — Ambulatory Visit (INDEPENDENT_AMBULATORY_CARE_PROVIDER_SITE_OTHER): Payer: Medicare Other | Admitting: Internal Medicine

## 2013-08-28 VITALS — BP 136/50 | HR 88 | Ht 63.0 in | Wt 130.0 lb

## 2013-08-28 DIAGNOSIS — Z86010 Personal history of colonic polyps: Secondary | ICD-10-CM | POA: Diagnosis not present

## 2013-08-28 DIAGNOSIS — I6529 Occlusion and stenosis of unspecified carotid artery: Secondary | ICD-10-CM | POA: Diagnosis not present

## 2013-08-28 DIAGNOSIS — K5520 Angiodysplasia of colon without hemorrhage: Secondary | ICD-10-CM | POA: Diagnosis not present

## 2013-08-28 DIAGNOSIS — K219 Gastro-esophageal reflux disease without esophagitis: Secondary | ICD-10-CM

## 2013-08-28 DIAGNOSIS — Z1211 Encounter for screening for malignant neoplasm of colon: Secondary | ICD-10-CM

## 2013-08-28 DIAGNOSIS — D509 Iron deficiency anemia, unspecified: Secondary | ICD-10-CM | POA: Diagnosis not present

## 2013-08-28 MED ORDER — MOVIPREP 100 G PO SOLR: 1.0000 | Freq: Once | ORAL | Status: DC

## 2013-08-28 NOTE — Patient Instructions (Signed)

## 2013-08-28 NOTE — Progress Notes (Signed)
HISTORY OF PRESENT ILLNESS:  Ronald Bell is a 73 y.o. male with multiple significant medical problems who presents today regarding followup colonoscopy. 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 Last upper endoscopy February 2012. Last colonoscopy January 2013 with 3 colon polyps including residual adenoma from prior piecemeal polypectomy. Cecal AVMs noted. Followup in 2 years recommended. Patient reports that his chronic medical problems are stable. He recently retired and celebrated his 73th birthday. GI review of systems is negative. He does continue on chronic iron therapy as requested. Hemoglobin from November 2014 was normal at 13.9  REVIEW OF SYSTEMS:  All non-GI ROS negative except for arthritis  Past Medical History  Diagnosis Date  . Other symptoms involving cardiovascular system   . Polyp of nasal cavity   . Other specified disorders of arteries and arterioles   . Cervicalgia   . Routine general medical examination at a health care facility   . Rosacea   . Benign neoplasm of colon   . Personal history of tobacco use, presenting hazards to health   . Personal history of other diseases of digestive system   . Psychosexual dysfunction with inhibited sexual excitement   . Chronic airway obstruction, not elsewhere classified   . Unspecified essential hypertension   . Other and unspecified hyperlipidemia   . GERD (gastroesophageal reflux disease)   . Anemia   . Arthritis   . Blood transfusion 2010    4 units  . Adenomatous colon polyp   . Peripheral vascular disease   . Irregular heartbeat   . Shingles   . Carotid artery occlusion   . Angiodysplasia of intestine with hemorrhage     Past Surgical History  Procedure Laterality Date  . Colon surgery    . Iliac artery stent  2005  . Esophagogastroduodenoscopy  2012    normal    Social History Ronald Bell  reports  that he has been smoking Cigarettes.  He has a 41.25 pack-year smoking history. He has never used smokeless tobacco. He reports that he does not drink alcohol or use illicit drugs.  family history includes Colitis in his father; Heart disease in his brother and daughter; Hypertension in his brother and son; Lung disease in his mother; Uterine cancer in an other family member. There is no history of Colon cancer.  No Known Allergies     PHYSICAL EXAMINATION: Vital signs: BP 136/50  Pulse 88  Ht 5\' 3"  (1.6 m)  Wt 130 lb (58.968 kg)  BMI 23.03 kg/m2  Constitutional: generally well-appearing, no acute distress Psychiatric: alert and oriented x3, cooperative Eyes: extraocular movements intact, anicteric, conjunctiva pink Mouth: oral pharynx moist, no lesions Neck: supple no lymphadenopathy Cardiovascular: heart regular rate and rhythm, no murmur Lungs: clear to auscultation bilaterally Abdomen: soft, nontender, nondistended, no obvious ascites, no peritoneal signs, normal bowel sounds, no organomegaly Rectal: Deferred until colonoscopy Extremities: no lower extremity edema bilaterally Skin: no lesions on visible extremities Neuro: No focal deficits.   ASSESSMENT:  #1. History of adenomatous colon polyps. Due for surveillance #2. Gastrointestinal AVMs #3. 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 GERD #5. Multiple medical problems. Stable   PLAN:  #1. Continue iron #2. Continue PPI #3. Surveillance colonoscopy.The nature of the procedure, as well as the risks, benefits, and alternatives were carefully and thoroughly reviewed with the patient. Ample time for discussion and questions allowed. The patient understood, was satisfied, and agreed  to proceed. Movi prep prescribed. Patient instructed on its use

## 2013-09-06 ENCOUNTER — Telehealth: Payer: Self-pay | Admitting: Internal Medicine

## 2013-09-06 DIAGNOSIS — R911 Solitary pulmonary nodule: Secondary | ICD-10-CM

## 2013-09-06 DIAGNOSIS — J449 Chronic obstructive pulmonary disease, unspecified: Secondary | ICD-10-CM

## 2013-09-06 DIAGNOSIS — J61 Pneumoconiosis due to asbestos and other mineral fibers: Secondary | ICD-10-CM

## 2013-09-06 NOTE — Telephone Encounter (Signed)
Pt aware PCC's will call w/ appt for CT scan. appt scheduled to see MW 09/20/13. Nothing further needed

## 2013-09-06 NOTE — Telephone Encounter (Signed)
Per OV 06/09/13: Patient Instructions      No change in medications The key is to stop smoking completely before smoking completely stops you - it's not too late!! Follow up in last week in June 2015 - we will call in meantime for CT 09/11/13    ---  Please advise if CT needs to be with or w/o contrast MW? thanks

## 2013-09-06 NOTE — Telephone Encounter (Signed)
No contrast f/u asbestos/ nodule

## 2013-09-08 ENCOUNTER — Encounter: Payer: Self-pay | Admitting: Internal Medicine

## 2013-09-08 ENCOUNTER — Ambulatory Visit (INDEPENDENT_AMBULATORY_CARE_PROVIDER_SITE_OTHER)
Admission: RE | Admit: 2013-09-08 | Discharge: 2013-09-08 | Disposition: A | Discharge status: Home / Self Care | Payer: Medicare Other | Source: Ambulatory Visit | Attending: Internal Medicine | Admitting: Internal Medicine

## 2013-09-08 DIAGNOSIS — J61 Pneumoconiosis due to asbestos and other mineral fibers: Secondary | ICD-10-CM

## 2013-09-08 DIAGNOSIS — J438 Other emphysema: Secondary | ICD-10-CM | POA: Diagnosis not present

## 2013-09-08 DIAGNOSIS — J449 Chronic obstructive pulmonary disease, unspecified: Secondary | ICD-10-CM

## 2013-09-08 DIAGNOSIS — R911 Solitary pulmonary nodule: Secondary | ICD-10-CM | POA: Diagnosis not present

## 2013-09-08 DIAGNOSIS — J4489 Other specified chronic obstructive pulmonary disease: Secondary | ICD-10-CM

## 2013-09-11 NOTE — Progress Notes (Signed)
Quick Note:  Spoke with pt and notified of results per Dr. Wert. Pt verbalized understanding and denied any questions.  ______ 

## 2013-09-20 ENCOUNTER — Ambulatory Visit: Payer: Medicare Other | Admitting: Internal Medicine

## 2013-09-20 HISTORY — PX: COLONOSCOPY: SHX174

## 2013-10-23 ENCOUNTER — Ambulatory Visit (AMBULATORY_SURGERY_CENTER): Payer: Medicare Other | Admitting: Internal Medicine

## 2013-10-23 ENCOUNTER — Encounter: Payer: Self-pay | Admitting: Internal Medicine

## 2013-10-23 VITALS — BP 128/44 | HR 74 | Temp 97.5°F | Resp 21 | Ht 63.0 in | Wt 130.0 lb

## 2013-10-23 DIAGNOSIS — J449 Chronic obstructive pulmonary disease, unspecified: Secondary | ICD-10-CM | POA: Diagnosis not present

## 2013-10-23 DIAGNOSIS — D126 Benign neoplasm of colon, unspecified: Secondary | ICD-10-CM | POA: Diagnosis not present

## 2013-10-23 DIAGNOSIS — Z86010 Personal history of colonic polyps: Secondary | ICD-10-CM

## 2013-10-23 DIAGNOSIS — D649 Anemia, unspecified: Secondary | ICD-10-CM | POA: Diagnosis not present

## 2013-10-23 DIAGNOSIS — K5520 Angiodysplasia of colon without hemorrhage: Secondary | ICD-10-CM

## 2013-10-23 MED ORDER — SODIUM CHLORIDE 0.9 % IV SOLN: 500.0000 mL | INTRAVENOUS | Status: DC

## 2013-10-23 NOTE — Op Note (Signed)
[facility]  [address] [place], [id number]   COLONOSCOPY PROCEDURE REPORT  PATIENT: Ronald Bell, Ronald Bell  MR#: [id number] BIRTHDATE: Oct 18, 1940 , 73  yrs. old GENDER: Male ENDOSCOPIST: Eustace Quail, MD REFERRED QQ:[id number] Program Recall PROCEDURE DATE:  10/23/2013 PROCEDURE:   Colonoscopy with snare polypectomy x 3 First Screening Colonoscopy - Avg.  risk and is 50 yrs.  old or older - No.  Prior Negative Screening - Now for repeat screening. N/A  History of Adenoma - Now for follow-up colonoscopy & has been > or = to 3 yrs.  Yes hx of adenoma.  Has been 3 or more years since last colonoscopy.  Polyps Removed Today? Yes. ASA CLASS:   Class III INDICATIONS:Patient's personal history of adenomatous colon polyps. Prior exams piecemeal polypectomy 2012,2013 MEDICATIONS: MAC sedation, administered by CRNA and propofol (Diprivan) 200mg  IV  DESCRIPTION OF PROCEDURE:   After the risks benefits and alternatives of the procedure were thoroughly explained, informed consent was obtained.  A digital rectal exam revealed no abnormalities of the rectum.   The LB [id number]  endoscope was introduced through the anus and advanced to the cecum, which was identified by both the appendix and ileocecal valve. No adverse events experienced.   The quality of the prep was excellent, using MoviPrep  The instrument was then slowly withdrawn as the colon was fully examined.  COLON FINDINGS: Three diminutive polyps were found at the cecum, transverse, and descending colon.  A polypectomy was performed with a cold snare.  The resection was complete and the polyp tissue was completely retrieved.   Angiodysplastic lesions in  the cecum. Moderate sigmoid diverticulosis .   The colon mucosa was otherwise normal.  Retroflexed views revealed internal hemorrhoids. The time to cecum=1 min 38 sec. ithdrawal time=12 min 15 sec. The scope was withdrawn and the procedure  completed. COMPLICATIONS: There were no complications.  ENDOSCOPIC IMPRESSION: 1.   Three diminutive polyps were found in the colon; polypectomy was performed with a cold snare 2.   Angiodysplastic lesions in the cecum 3.   Moderate diverticulosis was noted in the sigmoid colon  RECOMMENDATIONS: 1. Return to the care of your primary provider.  GI follow up as needed   eSigned:  Eustace Quail, MD 10/23/2013 9:08 AM   cc: Rosalita Chessman, DO and The Patient

## 2013-10-23 NOTE — Progress Notes (Signed)
Called to room to assist during endoscopic procedure.  Patient ID and intended procedure confirmed with present staff. Received instructions for my participation in the procedure from the performing physician.  

## 2013-10-23 NOTE — Progress Notes (Signed)
A/ox3, pleased with MAC, report to RN 

## 2013-10-23 NOTE — Patient Instructions (Signed)
YOU HAD AN ENDOSCOPIC PROCEDURE TODAY AT THE [facility]: Refer to the procedure report that was given to you for any specific questions about what was found during the examination.  If the procedure report does not answer your questions, please call your gastroenterologist to clarify.  If you requested that your care partner not be given the details of your procedure findings, then the procedure report has been included in a sealed envelope for you to review at your convenience later.  YOU SHOULD EXPECT: Some feelings of bloating in the abdomen. Passage of more gas than usual.  Walking can help get rid of the air that was put into your GI tract during the procedure and reduce the bloating. If you had a lower endoscopy (such as a colonoscopy or flexible sigmoidoscopy) you may notice spotting of blood in your stool or on the toilet paper. If you underwent a bowel prep for your procedure, then you may not have a normal bowel movement for a few days.  DIET: Your first meal following the procedure should be a light meal and then it is ok to progress to your normal diet.  A half-sandwich or bowl of soup is an example of a good first meal.  Heavy or fried foods are harder to digest and may make you feel nauseous or bloated.  Likewise meals heavy in dairy and vegetables can cause extra gas to form and this can also increase the bloating.  Drink plenty of fluids but you should avoid alcoholic beverages for 24 hours.  ACTIVITY: Your care partner should take you home directly after the procedure.  You should plan to take it easy, moving slowly for the rest of the day.  You can resume normal activity the day after the procedure however you should NOT DRIVE or use heavy machinery for 24 hours (because of the sedation medicines used during the test).    SYMPTOMS TO REPORT IMMEDIATELY: A gastroenterologist can be reached at any hour.  During normal business hours, 8:30 AM to 5:00 PM Monday through Friday,  call [phone].  After hours and on weekends, please call the GI answering service at [phone] who will take a message and have the physician on call contact you.   Following lower endoscopy (colonoscopy or flexible sigmoidoscopy):  Excessive amounts of blood in the stool  Significant tenderness or worsening of abdominal pains  Swelling of the abdomen that is new, acute  Fever of 100F or higher    FOLLOW UP: If any biopsies were taken you will be contacted by phone or by letter within the next 1-3 weeks.  Call your gastroenterologist if you have not heard about the biopsies in 3 weeks.  Our staff will call the home number listed on your records the next business day following your procedure to check on you and address any questions or concerns that you may have at that time regarding the information given to you following your procedure. This is a courtesy call and so if there is no answer at the home number and we have not heard from you through the emergency physician on call, we will assume that you have returned to your regular daily activities without incident.  SIGNATURES/CONFIDENTIALITY: You and/or your care partner have signed paperwork which will be entered into your electronic medical record.  These signatures attest to the fact that that the information above on your After Visit Summary has been reviewed and is understood.  Full responsibility of the confidentiality   of this discharge information lies with you and/or your care-partner.  Polyp, diverticulosis and high fiber diet information given.  GI follow-up as needed.

## 2013-10-24 ENCOUNTER — Ambulatory Visit: Payer: PRIVATE HEALTH INSURANCE | Admitting: Vascular Surgery

## 2013-10-24 ENCOUNTER — Telehealth: Payer: Self-pay | Admitting: *Deleted

## 2013-10-24 ENCOUNTER — Other Ambulatory Visit (HOSPITAL_COMMUNITY): Payer: Medicare Other

## 2013-10-24 NOTE — Telephone Encounter (Signed)
  Follow up Call-  Call back number 10/23/2013 04/03/2011  Post procedure Call Back phone  # [phone] ext [phone]  may leave a message  Permission to leave phone message Yes -     Patient questions:  Do you have a fever, pain , or abdominal swelling? No. Pain Score  0 *  Have you tolerated food without any problems? Yes.    Have you been able to return to your normal activities? Yes.    Do you have any questions about your discharge instructions: Diet   No. Medications  No. Follow up visit  No.  Do you have questions or concerns about your Care? No.  Actions: * If pain score is 4 or above: No action needed, pain <4.

## 2013-10-26 ENCOUNTER — Encounter: Payer: Self-pay | Admitting: Internal Medicine

## 2013-11-06 ENCOUNTER — Encounter: Payer: Self-pay | Admitting: Vascular Surgery

## 2013-11-07 ENCOUNTER — Encounter: Payer: Self-pay | Admitting: Vascular Surgery

## 2013-11-07 ENCOUNTER — Ambulatory Visit (HOSPITAL_COMMUNITY)
Admission: RE | Admit: 2013-11-07 | Discharge: 2013-11-07 | Disposition: A | Discharge status: Home / Self Care | Payer: Medicare Other | Source: Ambulatory Visit | Attending: Vascular Surgery | Admitting: Vascular Surgery

## 2013-11-07 ENCOUNTER — Ambulatory Visit (INDEPENDENT_AMBULATORY_CARE_PROVIDER_SITE_OTHER): Payer: Medicare Other | Admitting: Vascular Surgery

## 2013-11-07 VITALS — BP 92/58 | HR 74 | Ht 63.0 in | Wt 135.5 lb

## 2013-11-07 DIAGNOSIS — I6529 Occlusion and stenosis of unspecified carotid artery: Secondary | ICD-10-CM | POA: Diagnosis not present

## 2013-11-07 NOTE — Progress Notes (Signed)
    Established Carotid Patient  History of Present Illness  Ronald Bell is a 73 y.o. ([birth year]) male who is here for one year followup of bilateral carotid stenosis and peripheral vascular disease. He is status post left iliac common iliac stent placement in 2005.   Patient has no prior history of TIA or stroke symptom.  He denies amaurosis fugax or monocular blindness, facial drooping, hemiplegia and receptive or expressive aphasia.    The patient's PMH, PSH, SH, FamHx, Med, and Allergies are unchanged from 10/18/2012  On ROS today: he denies any chest pain, shortness of breath at rest, intermittent claudication, rest pain, and non-healing wounds of the lower extremities. He does admit to shortness of breath with exertion, history of DVTand blood in the stool. He is seeing a gastroenterologist for the melena.   Physical Examination  Filed Vitals:   11/07/13 1408 11/07/13 1410  BP: 155/42 92/58  Pulse: 74   Height: 5\' 3"  (1.6 m)   Weight: 135 lb 8 oz (61.462 kg)   SpO2: 95%    Body mass index is 24.01 kg/(m^2).  General: A&O x 3, WDWN male in NAD  Pulmonary: Sym exp, good air movt, CTAB, no rales, rhonchi, & wheezing   Cardiac: RRR, Nl S1, S2, murmur heard Vascular: Vessel Right Left  Radial Palpable Palpable, weak  Carotid  with bruit with bruit  Aorta Not palpable N/A  Femoral Palpable Palpable  Popliteal Palpable P4alpable  PT Not palpable Not palpable  DP Palpable Palpable   Gastrointestinal: soft, NTND, -masses, -AAA  Musculoskeletal: M/S 5/5 throughout; Extremities without ischemic changes.   Neurologic: Sensation intact.  Motor exam as listed above  Non-Invasive Vascular Imaging  CAROTID DUPLEX (Date: 11/07/13):   R ICA stenosis: <40%  R VA:  patent and antegrade  L ICA stenosis: <40%  L VA:  Not identified  Medical Decision Making  COLVIN BLATT is a 73 y.o. male who presents with: asymptomatic bilateral ICA stenosis <40%; s/p left  common iliac stent placement 2005   Follow up in one year with bilateral carotid duplex.   He has palpable dorsalis pedis pulses bilaterally with no complaints of claudication. He will follow-up as needed.   Discussed in depth with the patient the nature of atherosclerosis, and emphasized the importance of maximal medical management including strict control of blood pressure, blood glucose, and lipid levels, antiplatelet agents, obtaining regular exercise, and cessation of smoking.    The patient is aware that without maximal medical management the underlying atherosclerotic disease process will progress, limiting the benefit of any interventions.  He is currently on a statin and aspirin.   Virgina Jock, PA-C [facility] Office: [phone] Pager: [phone]  11/07/2013, 2:51 PM  This patient was seen in conjunction with Dr. Donnetta Hutching   I have examined the patient, reviewed and agree with above.  Slayter Moorhouse, MD 11/07/2013 5:08 PM

## 2013-11-08 NOTE — Addendum Note (Signed)
Addended by: Mena Goes on: 11/08/2013 09:42 AM   Modules accepted: Orders

## 2013-11-13 ENCOUNTER — Telehealth: Payer: Self-pay | Admitting: Vascular Surgery

## 2013-11-13 ENCOUNTER — Encounter: Payer: Self-pay | Admitting: Family

## 2013-11-13 ENCOUNTER — Telehealth: Payer: Self-pay

## 2013-11-13 DIAGNOSIS — I739 Peripheral vascular disease, unspecified: Secondary | ICD-10-CM

## 2013-11-13 DIAGNOSIS — M79652 Pain in left thigh: Secondary | ICD-10-CM

## 2013-11-13 DIAGNOSIS — Z95828 Presence of other vascular implants and grafts: Secondary | ICD-10-CM

## 2013-11-13 NOTE — Telephone Encounter (Signed)
notified patient of appt. on 11-14-13 at 11:30 for vascular lab and 12:30 with suzanne

## 2013-11-13 NOTE — Telephone Encounter (Signed)
Phone call from pt.  Reported a 3 day history of "aching in the top of the left leg"; reported he notices this when he 1st gets up in the morning.  Describes it as feeling like a "hard cramp".  Reported that this will ease-up when he rests approx. 30 minutes.  Stated the pain is the worst first thing in the AM, but does bother him throughout the day, with walking.  Denies any numbness/tingling, or discoloration of left LE.  Denies rest pain.  Denies open sores.  Advised will schedule appt. For further evaluation.

## 2013-11-14 ENCOUNTER — Ambulatory Visit (INDEPENDENT_AMBULATORY_CARE_PROVIDER_SITE_OTHER): Payer: Medicare Other | Admitting: Family

## 2013-11-14 ENCOUNTER — Ambulatory Visit (HOSPITAL_COMMUNITY)
Admission: RE | Admit: 2013-11-14 | Discharge: 2013-11-14 | Disposition: A | Discharge status: Home / Self Care | Payer: Medicare Other | Source: Ambulatory Visit | Attending: Family | Admitting: Family

## 2013-11-14 ENCOUNTER — Encounter: Payer: Self-pay | Admitting: Family

## 2013-11-14 VITALS — BP 71/51 | HR 83 | Resp 16 | Ht 63.5 in | Wt 136.0 lb

## 2013-11-14 DIAGNOSIS — M79609 Pain in unspecified limb: Secondary | ICD-10-CM | POA: Diagnosis not present

## 2013-11-14 DIAGNOSIS — I6529 Occlusion and stenosis of unspecified carotid artery: Secondary | ICD-10-CM

## 2013-11-14 DIAGNOSIS — I739 Peripheral vascular disease, unspecified: Secondary | ICD-10-CM

## 2013-11-14 DIAGNOSIS — I723 Aneurysm of iliac artery: Secondary | ICD-10-CM

## 2013-11-14 DIAGNOSIS — M79652 Pain in left thigh: Secondary | ICD-10-CM | POA: Insufficient documentation

## 2013-11-14 DIAGNOSIS — Z9889 Other specified postprocedural states: Secondary | ICD-10-CM | POA: Insufficient documentation

## 2013-11-14 DIAGNOSIS — I771 Stricture of artery: Secondary | ICD-10-CM | POA: Insufficient documentation

## 2013-11-14 DIAGNOSIS — Z95828 Presence of other vascular implants and grafts: Secondary | ICD-10-CM

## 2013-11-14 NOTE — Progress Notes (Signed)
[facility] HISTORY AND PHYSICAL -PAD  History of Present Illness Ronald Bell is a 73 y.o. male patient of Dr. Donnetta Hutching who is being followed for bilateral carotid stenosis and peripheral vascular disease. He is status post left common iliac common iliac stent placement in 2005.  He returns today with c/o anterior thigh aching on getting out of bed every morning, started about 5 days ago, resolves with walking, denies non healing wounds, denies calves or buttocks pain. The pain was not as bad this morning. He denies an injury or strain.  The patient reports New Medical or Surgical History: colonoscopy last month.  Pt reports occasional tingling in left fingers; also states he has a known heart murmur.  Pt Diabetic: No Pt smoker: smoker  (15 cigarettes/day, started at age 79)  Pt meds include: Statin :Yes ASA: Yes Other anticoagulants/antiplatelets: no  Past Medical History  Diagnosis Date  . Other symptoms involving cardiovascular system   . Polyp of nasal cavity   . Other specified disorders of arteries and arterioles   . Cervicalgia   . Routine general medical examination at a health care facility   . Rosacea   . Benign neoplasm of colon   . Personal history of tobacco use, presenting hazards to health   . Personal history of other diseases of digestive system   . Psychosexual dysfunction with inhibited sexual excitement   . Chronic airway obstruction, not elsewhere classified   . Unspecified essential hypertension   . Other and unspecified hyperlipidemia   . GERD (gastroesophageal reflux disease)   . Anemia   . Arthritis   . Blood transfusion 2010    4 units  . Adenomatous colon polyp   . Peripheral vascular disease   . Irregular heartbeat   . Shingles   . Carotid artery occlusion   . Angiodysplasia of intestine with hemorrhage     Social History History  Substance Use Topics  . Smoking status: Current Every Day Smoker -- 0.75  packs/day for 55 years    Types: Cigarettes  . Smokeless tobacco: Never Used     Comment: pt states he knows he needs to quit. Also uses e-cig.  Marland Kitchen Alcohol Use: No    Family History Family History  Problem Relation Age of Onset  . Lung disease Mother     pulm fibrosis  . Uterine cancer    . Colitis Father   . Colon cancer Neg Hx   . Heart disease Brother   . Hypertension Brother   . Hyperlipidemia Brother   . Heart disease Daughter     cad  . Hypertension Son     Past Surgical History  Procedure Laterality Date  . Colon surgery    . Iliac artery stent  2005  . Esophagogastroduodenoscopy  2012    normal    No Known Allergies  Current Outpatient Prescriptions  Medication Sig Dispense Refill  . amLODipine (NORVASC) 10 MG tablet TAKE 1 TABLET (10 MG TOTAL) BY MOUTH DAILY.  90 tablet  3  . Ascorbic Acid (VITAMIN C) 250 MG tablet Take 500 mg by mouth daily.       Marland Kitchen aspirin 81 MG EC tablet Take 81 mg by mouth daily.        Marland Kitchen atorvastatin (LIPITOR) 40 MG tablet Take 1 tablet (40 mg total) by mouth daily.  90 tablet  3  . diclofenac sodium (VOLTAREN) 1 % GEL Apply 2 g topically 4 (four) times daily.  100 g  5  . DOCUSATE SODIUM PO Take 1 tablet by mouth 2 (two) times daily.       . Ferrous Sulfate (IRON) 325 (65 FE) MG TABS Take 1 tablet by mouth 2 (two) times daily.  90 each  3  . Fluticasone Furoate-Vilanterol (BREO ELLIPTA) 100-25 MCG/INH AEPB Inhale 1 puff into the lungs daily.      . Ipratropium-Albuterol (COMBIVENT RESPIMAT) 20-100 MCG/ACT AERS respimat Inhale 1 puff into the lungs 4 (four) times daily as needed for wheezing.  1 Inhaler  3  . mometasone (ELOCON) 0.1 % cream Apply 1 application topically daily.  45 g  3  . Multiple Vitamins-Minerals (CENTRUM SILVER PO) Take by mouth daily.        . pantoprazole (PROTONIX) 40 MG tablet TAKE ONE TABLET BY MOUTH ONCE DAILY  90 tablet  3  . valsartan-hydrochlorothiazide (DIOVAN HCT) 160-12.5 MG per tablet Take 1 tablet by mouth  daily.  90 tablet  3   No current facility-administered medications for this visit.    ROS: See HPI for pertinent positives and negatives.   Physical Examination  Filed Vitals:   11/14/13 1158 11/14/13 1200  BP: 149/62 71/51  Pulse: 82 83  Resp:  16  Height:  5' 3.5" (1.613 m)  Weight:  136 lb (61.689 kg)  SpO2:  95%   Body mass index is 23.71 kg/(m^2).  General: A&O x 3, WDWN. Gait: normal Eyes: Pupils equal Pulmonary: CTAB, without wheezes , rales or rhonchi. Cardiac: regular Rythm , with murmur.         Carotid Bruits Right Left   Transmitted cardiac murmur Transmitted cardiac murmur  Aorta is not palpable. Radial pulses: right is 3+, left is not palpable, left brachial is 2+                           VASCULAR EXAM: Extremities without ischemic changes  without Gangrene; without open wounds.                                                                                                          LE Pulses Right Left       FEMORAL  2+ palpable  2+ palpable        POPLITEAL  not palpable   not palpable       POSTERIOR TIBIAL  not palpable   not palpable        DORSALIS PEDIS      ANTERIOR TIBIAL 2+ palpable  2+ palpable    Abdomen: soft, NT, no masses. Skin: no rashes, no ulcers noted. Musculoskeletal: no muscle wasting or atrophy.  Neurologic: A&O X 3; Appropriate Affect ; SENSATION: normal; MOTOR FUNCTION:  moving all extremities equally, motor strength 4/5 throughout. Speech is fluent/normal. CN 2-12 intact, is slightly hard of hearing.    Non-Invasive Vascular Imaging: DATE: 11/14/2013 ILIAC ARTERY STENT EVALUATION    INDICATION: Peripheral Vascular Disease    PREVIOUS INTERVENTION(S): Left CIA stent placed 2005.    DUPLEX EXAM:  RIGHT  LEFT   Peak Systolic Velocity (cm/s) Ratio (if abnormal) Waveform  Peak Systolic Velocity (cm/s) Ratio (if abnormal) Waveform     Aorta - Distal 74  T     Artery - Proximal to Stent 223  T     Stent -  Proximal 306  T     Stent - Mid 175  B     Stent - Distal 147  B     Artery - Distal to Stent 169/147/230  B/B/B  N/A Today's ABI / TBI N/A  1.01 Previous ABI / TBI (10/18/2012 ) 0.97    Waveform:    M - Monophasic       B - Biphasic       T - Triphasic  If Ankle Brachial Index (ABI) or Toe Brachial Index (TBI) performed, please see complete report     ADDITIONAL FINDINGS:     IMPRESSION: Left common iliac artery is patent with heterogeneous plaque present at the proximal stent suggestive of greater than 50% stenosis.    Compared to the previous exam:  Similar velocities when compared to previous study on 10/18/2012.    ASSESSMENT: Ronald Bell is a 73 y.o. male who is status post left common iliac common iliac stent placement in 2005.  He returns today with c/o anterior thigh aching on getting out of bed every morning, started about 5 days ago, resolves with walking, denies non healing wounds, denies calves or buttocks pain. The pain was not as bad this morning. Left common iliac artery is patent with heterogeneous plaque present at the proximal stent suggestive of greater than 50% stenosis. Similar velocities when compared to previous study on 10/18/2012. Since his left thigh pain started 5 days ago, resolves with walking instead of worsening with walking, and since both pedal pulses are 2+ palpable, his thigh pain is not due to arterial insufficiency.   PLAN:  Pt was counseled re smoking cessation. I discussed in depth with the patient the nature of atherosclerosis, and emphasized the importance of maximal medical management including strict control of blood pressure, blood glucose, and lipid levels, obtaining regular exercise, and cessation of smoking.  The patient is aware that without maximal medical management the underlying atherosclerotic disease process will progress, limiting the benefit of any interventions.  Based on the patient's vascular studies and examination,  and after discussing with Dr. Donnetta Hutching, pt will return to clinic in 1 year for carotid Duplex, ABI's,  and left iliac artery stent Duplex.  The patient was given information about PAD including signs, symptoms, treatment, what symptoms should prompt the patient to seek immediate medical care, and risk reduction measures to take.  Clemon Chambers, RN, MSN, FNP-C [facility] Phone: [phone]  Clinic MD: Early  11/14/2013 11:56 AM

## 2013-11-14 NOTE — Patient Instructions (Addendum)
Peripheral Vascular Disease Peripheral Vascular Disease (PVD), also called Peripheral Arterial Disease (PAD), is a circulation problem caused by cholesterol (atherosclerotic plaque) deposits in the arteries. PVD commonly occurs in the lower extremities (legs) but it can occur in other areas of the body, such as your arms. The cholesterol buildup in the arteries reduces blood flow which can cause pain and other serious problems. The presence of PVD can place a person at risk for Coronary Artery Disease (CAD).  CAUSES  Causes of PVD can be many. It is usually associated with more than one risk factor such as:   High Cholesterol.  Smoking.  Diabetes.  Lack of exercise or inactivity.  High blood pressure (hypertension).  Obesity.  Family history. SYMPTOMS   When the lower extremities are affected, patients with PVD may experience:  Leg pain with exertion or physical activity. This is called INTERMITTENT CLAUDICATION. This may present as cramping or numbness with physical activity. The location of the pain is associated with the level of blockage. For example, blockage at the abdominal level (distal abdominal aorta) may result in buttock or hip pain. Lower leg arterial blockage may result in calf pain.  As PVD becomes more severe, pain can develop with less physical activity.  In people with severe PVD, leg pain may occur at rest.  Other PVD signs and symptoms:  Leg numbness or weakness.  Coldness in the affected leg or foot, especially when compared to the other leg.  A change in leg color.  Patients with significant PVD are more prone to ulcers or sores on toes, feet or legs. These may take longer to heal or may reoccur. The ulcers or sores can become infected.  If signs and symptoms of PVD are ignored, gangrene may occur. This can result in the loss of toes or loss of an entire limb.  Not all leg pain is related to PVD. Other medical conditions can cause leg pain such  as:  Blood clots (embolism) or Deep Vein Thrombosis.  Inflammation of the blood vessels (vasculitis).  Spinal stenosis. DIAGNOSIS  Diagnosis of PVD can involve several different types of tests. These can include:  Pulse Volume Recording Method (PVR). This test is simple, painless and does not involve the use of X-rays. PVR involves measuring and comparing the blood pressure in the arms and legs. An ABI (Ankle-Brachial Index) is calculated. The normal ratio of blood pressures is 1. As this number becomes smaller, it indicates more severe disease.  < 0.95 - indicates significant narrowing in one or more leg vessels.  <0.8 - there will usually be pain in the foot, leg or buttock with exercise.  <0.4 - will usually have pain in the legs at rest.  <0.25 - usually indicates limb threatening PVD.  Doppler detection of pulses in the legs. This test is painless and checks to see if you have a pulses in your legs/feet.  A dye or contrast material (a substance that highlights the blood vessels so they show up on x-ray) may be given to help your caregiver better see the arteries for the following tests. The dye is eliminated from your body by the kidney's. Your caregiver may order blood work to check your kidney function and other laboratory values before the following tests are performed:  Magnetic Resonance Angiography (MRA). An MRA is a picture study of the blood vessels and arteries. The MRA machine uses a large magnet to produce images of the blood vessels.  Computed Tomography Angiography (CTA). A CTA   is a specialized x-ray that looks at how the blood flows in your blood vessels. An IV may be inserted into your arm so contrast dye can be injected.  Angiogram. Is a procedure that uses x-rays to look at your blood vessels. This procedure is minimally invasive, meaning a small incision (cut) is made in your groin. A small tube (catheter) is then inserted into the artery of your groin. The catheter  is guided to the blood vessel or artery your caregiver wants to examine. Contrast dye is injected into the catheter. X-rays are then taken of the blood vessel or artery. After the images are obtained, the catheter is taken out. TREATMENT  Treatment of PVD involves many interventions which may include:  Lifestyle changes:  Quitting smoking.  Exercise.  Following a low fat, low cholesterol diet.  Control of diabetes.  Foot care is very important to the PVD patient. Good foot care can help prevent infection.  Medication:  Cholesterol-lowering medicine.  Blood pressure medicine.  Anti-platelet drugs.  Certain medicines may reduce symptoms of Intermittent Claudication.  Interventional/Surgical options:  Angioplasty. An Angioplasty is a procedure that inflates a balloon in the blocked artery. This opens the blocked artery to improve blood flow.  Stent Implant. A wire mesh tube (stent) is placed in the artery. The stent expands and stays in place, allowing the artery to remain open.  Peripheral Bypass Surgery. This is a surgical procedure that reroutes the blood around a blocked artery to help improve blood flow. This type of procedure may be performed if Angioplasty or stent implants are not an option. SEEK IMMEDIATE MEDICAL CARE IF:   You develop pain or numbness in your arms or legs.  Your arm or leg turns cold, becomes blue in color.  You develop redness, warmth, swelling and pain in your arms or legs. MAKE SURE YOU:   Understand these instructions.  Will watch your condition.  Will get help right away if you are not doing well or get worse. Document Released: 04/16/2004 Document Revised: 06/01/2011 Document Reviewed: 03/13/2008 ExitCare Patient Information 2015 [employer]. This information is not intended to replace advice given to you by your health care provider. Make sure you discuss any questions you have with your health care provider.   Stroke  Prevention Some medical conditions and behaviors are associated with an increased chance of having a stroke. You may prevent a stroke by making healthy choices and managing medical conditions. HOW CAN I REDUCE MY RISK OF HAVING A STROKE?   Stay physically active. Get at least 30 minutes of activity on most or all days.  Do not smoke. It may also be helpful to avoid exposure to secondhand smoke.  Limit alcohol use. Moderate alcohol use is considered to be:  No more than 2 drinks per day for men.  No more than 1 drink per day for nonpregnant women.  Eat healthy foods. This involves:  Eating 5 or more servings of fruits and vegetables a day.  Making dietary changes that address high blood pressure (hypertension), high cholesterol, diabetes, or obesity.  Manage your cholesterol levels.  Making food choices that are high in fiber and low in saturated fat, trans fat, and cholesterol may control cholesterol levels.  Take any prescribed medicines to control cholesterol as directed by your health care provider.  Manage your diabetes.  Controlling your carbohydrate and sugar intake is recommended to manage diabetes.  Take any prescribed medicines to control diabetes as directed by your health care provider.    Control your hypertension.  Making food choices that are low in salt (sodium), saturated fat, trans fat, and cholesterol is recommended to manage hypertension.  Take any prescribed medicines to control hypertension as directed by your health care provider.  Maintain a healthy weight.  Reducing calorie intake and making food choices that are low in sodium, saturated fat, trans fat, and cholesterol are recommended to manage weight.  Stop drug abuse.  Avoid taking birth control pills.  Talk to your health care provider about the risks of taking birth control pills if you are over 35 years old, smoke, get migraines, or have ever had a blood clot.  Get evaluated for sleep  disorders (sleep apnea).  Talk to your health care provider about getting a sleep evaluation if you snore a lot or have excessive sleepiness.  Take medicines only as directed by your health care provider.  For some people, aspirin or blood thinners (anticoagulants) are helpful in reducing the risk of forming abnormal blood clots that can lead to stroke. If you have the irregular heart rhythm of atrial fibrillation, you should be on a blood thinner unless there is a good reason you cannot take them.  Understand all your medicine instructions.  Make sure that other conditions (such as anemia or atherosclerosis) are addressed. SEEK IMMEDIATE MEDICAL CARE IF:   You have sudden weakness or numbness of the face, arm, or leg, especially on one side of the body.  Your face or eyelid droops to one side.  You have sudden confusion.  You have trouble speaking (aphasia) or understanding.  You have sudden trouble seeing in one or both eyes.  You have sudden trouble walking.  You have dizziness.  You have a loss of balance or coordination.  You have a sudden, severe headache with no known cause.  You have new chest pain or an irregular heartbeat. Any of these symptoms may represent a serious problem that is an emergency. Do not wait to see if the symptoms will go away. Get medical help at once. Call your local emergency services (911 in [place] Do not drive yourself to the hospital. Document Released: 04/16/2004 Document Revised: 07/24/2013 Document Reviewed: 09/09/2012 ExitCare Patient Information 2015 [employer]. This information is not intended to replace advice given to you by your health care provider. Make sure you discuss any questions you have with your health care provider.   Smoking Cessation Quitting smoking is important to your health and has many advantages. However, it is not always easy to quit since nicotine is a very addictive drug. Oftentimes, people try 3 times or more  before being able to quit. This document explains the best ways for you to prepare to quit smoking. Quitting takes hard work and a lot of effort, but you can do it. ADVANTAGES OF QUITTING SMOKING  You will live longer, feel better, and live better.  Your body will feel the impact of quitting smoking almost immediately.  Within 20 minutes, blood pressure decreases. Your pulse returns to its normal level.  After 8 hours, carbon monoxide levels in the blood return to normal. Your oxygen level increases.  After 24 hours, the chance of having a heart attack starts to decrease. Your breath, hair, and body stop smelling like smoke.  After 48 hours, damaged nerve endings begin to recover. Your sense of taste and smell improve.  After 72 hours, the body is virtually free of nicotine. Your bronchial tubes relax and breathing becomes easier.  After 2 to 12   weeks, lungs can hold more air. Exercise becomes easier and circulation improves.  The risk of having a heart attack, stroke, cancer, or lung disease is greatly reduced.  After 1 year, the risk of coronary heart disease is cut in half.  After 5 years, the risk of stroke falls to the same as a nonsmoker.  After 10 years, the risk of lung cancer is cut in half and the risk of other cancers decreases significantly.  After 15 years, the risk of coronary heart disease drops, usually to the level of a nonsmoker.  If you are pregnant, quitting smoking will improve your chances of having a healthy baby.  The people you live with, especially any children, will be healthier.  You will have extra money to spend on things other than cigarettes. QUESTIONS TO THINK ABOUT BEFORE ATTEMPTING TO QUIT You may want to talk about your answers with your health care provider.  Why do you want to quit?  If you tried to quit in the past, what helped and what did not?  What will be the most difficult situations for you after you quit? How will you plan to  handle them?  Who can help you through the tough times? Your family? Friends? A health care provider?  What pleasures do you get from smoking? What ways can you still get pleasure if you quit? Here are some questions to ask your health care provider:  How can you help me to be successful at quitting?  What medicine do you think would be best for me and how should I take it?  What should I do if I need more help?  What is smoking withdrawal like? How can I get information on withdrawal? GET READY  Set a quit date.  Change your environment by getting rid of all cigarettes, ashtrays, matches, and lighters in your home, car, or work. Do not let people smoke in your home.  Review your past attempts to quit. Think about what worked and what did not. GET SUPPORT AND ENCOURAGEMENT You have a better chance of being successful if you have help. You can get support in many ways.  Tell your family, friends, and coworkers that you are going to quit and need their support. Ask them not to smoke around you.  Get individual, group, or telephone counseling and support. Programs are available at local hospitals and health centers. Call your local health department for information about programs in your area.  Spiritual beliefs and practices may help some smokers quit.  Download a "quit meter" on your computer to keep track of quit statistics, such as how long you have gone without smoking, cigarettes not smoked, and money saved.  Get a self-help book about quitting smoking and staying off tobacco. LEARN NEW SKILLS AND BEHAVIORS  Distract yourself from urges to smoke. Talk to someone, go for a walk, or occupy your time with a task.  Change your normal routine. Take a different route to work. Drink tea instead of coffee. Eat breakfast in a different place.  Reduce your stress. Take a hot bath, exercise, or read a book.  Plan something enjoyable to do every day. Reward yourself for not  smoking.  Explore interactive web-based programs that specialize in helping you quit. GET MEDICINE AND USE IT CORRECTLY Medicines can help you stop smoking and decrease the urge to smoke. Combining medicine with the above behavioral methods and support can greatly increase your chances of successfully quitting smoking.  Nicotine replacement therapy   helps deliver nicotine to your body without the negative effects and risks of smoking. Nicotine replacement therapy includes nicotine gum, lozenges, inhalers, nasal sprays, and skin patches. Some may be available over-the-counter and others require a prescription.  Antidepressant medicine helps people abstain from smoking, but how this works is unknown. This medicine is available by prescription.  Nicotinic receptor partial agonist medicine simulates the effect of nicotine in your brain. This medicine is available by prescription. Ask your health care provider for advice about which medicines to use and how to use them based on your health history. Your health care provider will tell you what side effects to look out for if you choose to be on a medicine or therapy. Carefully read the information on the package. Do not use any other product containing nicotine while using a nicotine replacement product.  RELAPSE OR DIFFICULT SITUATIONS Most relapses occur within the first 3 months after quitting. Do not be discouraged if you start smoking again. Remember, most people try several times before finally quitting. You may have symptoms of withdrawal because your body is used to nicotine. You may crave cigarettes, be irritable, feel very hungry, cough often, get headaches, or have difficulty concentrating. The withdrawal symptoms are only temporary. They are strongest when you first quit, but they will go away within 10-14 days. To reduce the chances of relapse, try to:  Avoid drinking alcohol. Drinking lowers your chances of successfully quitting.  Reduce the  amount of caffeine you consume. Once you quit smoking, the amount of caffeine in your body increases and can give you symptoms, such as a rapid heartbeat, sweating, and anxiety.  Avoid smokers because they can make you want to smoke.  Do not let weight gain distract you. Many smokers will gain weight when they quit, usually less than 10 pounds. Eat a healthy diet and stay active. You can always lose the weight gained after you quit.  Find ways to improve your mood other than smoking. FOR MORE INFORMATION  www.smokefree.gov  Document Released: 03/03/2001 Document Revised: 07/24/2013 Document Reviewed: 06/18/2011 ExitCare Patient Information 2015 [employer]. This information is not intended to replace advice given to you by your health care provider. Make sure you discuss any questions you have with your health care provider.  

## 2013-11-23 ENCOUNTER — Encounter: Payer: Self-pay | Admitting: Internal Medicine

## 2013-12-01 ENCOUNTER — Telehealth: Payer: Self-pay | Admitting: Internal Medicine

## 2013-12-01 MED ORDER — FLUTICASONE FUROATE-VILANTEROL 100-25 MCG/INH IN AEPB: 1.0000 | INHALATION_SPRAY | Freq: Every day | RESPIRATORY_TRACT | Status: DC

## 2013-12-01 NOTE — Telephone Encounter (Signed)
Spoke with the pt He states that he is applying for pt assistance for Breo  Requesting 90 days supply rx and 1 sample to pick up  I have left both of these up front  Nothing further needed

## 2013-12-25 DIAGNOSIS — Z23 Encounter for immunization: Secondary | ICD-10-CM | POA: Diagnosis not present

## 2014-02-01 ENCOUNTER — Telehealth: Payer: Self-pay | Admitting: Family Medicine

## 2014-02-01 MED ORDER — TRAMADOL HCL 50 MG PO TABS: 50.0000 mg | ORAL_TABLET | Freq: Four times a day (QID) | ORAL | Status: DC | PRN

## 2014-02-01 NOTE — Telephone Encounter (Signed)
Caller name: Chisum, Habenicht Relation to pt: self Call back number: [phone]   Reason for call:    Pt in need of of clinical advice regarding he's arithies requesting a rx. Pt does not want to schedule an OV he would like to discuss he's concerns please call [phone]

## 2014-02-01 NOTE — Telephone Encounter (Signed)
spoke with patient and he voiced understanding. He will pick up the med's tomorrow.      KP

## 2014-02-01 NOTE — Telephone Encounter (Signed)
C/O Arthritis in the left upper left leg (thigh) and he is having a throbbing pain the pain is constant ache, he is using the Voltaren and taking Tylenol Arthritis 650, he says it helps but the pain is still there, he wanted to know if you could send in something a little stronger. Please advise      KP

## 2014-02-01 NOTE — Telephone Encounter (Signed)
Ultram 50 mg 1 po q6h prn #30

## 2014-02-05 ENCOUNTER — Telehealth: Payer: Self-pay | Admitting: Family Medicine

## 2014-02-05 MED ORDER — HYDROCODONE-ACETAMINOPHEN 5-325 MG PO TABS: 1.0000 | ORAL_TABLET | Freq: Four times a day (QID) | ORAL | Status: DC | PRN

## 2014-02-05 NOTE — Telephone Encounter (Signed)
Please advise on alternate medication.      KP

## 2014-02-05 NOTE — Telephone Encounter (Signed)
vicodin 5 / 325  #30  1 po q6h prn

## 2014-02-05 NOTE — Telephone Encounter (Signed)
Caller name: Dhiren Relation to pt: self Call back number: [phone] Pharmacy: [facility] on [address]  Reason for call:    Patient called in stating that he wanted to talk to Maudie Mercury and states that his arithitis is still bothering him. He says that tramadol is not working.

## 2014-02-05 NOTE — Telephone Encounter (Signed)
Patient aware Hydrocodone ready for pick up. He is scheduled for an Eval on Friday.      KP

## 2014-02-06 ENCOUNTER — Ambulatory Visit: Payer: Medicare Other | Admitting: Family Medicine

## 2014-02-07 NOTE — Telephone Encounter (Signed)
Patient is requesting a stronger medication for his knee pain, wife is declining a sooner apt and when I asked to speak with the patient she said he was alseep, she said he needs to rest and can not come in any sooner than Friday. I made her aware if he is in that much pain he would come in, she said we been seeing him for a long time and we need to do this for him, she said the pain starts at the knee and is going into his thigh. I again advised her to have him come in and she said no he could not come in but she wants him to have something stronger. Please advise.       KP

## 2014-02-07 NOTE — Telephone Encounter (Signed)
Pt has hydrocodone and Tramadol available for pain relief.  If his pain is not controlled, he requires office visit for evaluation.  He can be seen here based on PA availability, orthopedic UC, or ER.  No additional pain meds will be given w/o evaluation.

## 2014-02-07 NOTE — Telephone Encounter (Signed)
Wife declined and she said he will see [place] Friday and h/u.     KP

## 2014-02-07 NOTE — Telephone Encounter (Signed)
Patient wife called in stating that patient knee pain is worse. I offered earlier appt, she declined. She states that patient has doubled up on hydrocodone and this is not helping. She would like something stronger called in. Best # [phone]

## 2014-02-09 ENCOUNTER — Encounter: Payer: Self-pay | Admitting: Family Medicine

## 2014-02-09 ENCOUNTER — Ambulatory Visit (HOSPITAL_BASED_OUTPATIENT_CLINIC_OR_DEPARTMENT_OTHER)
Admission: RE | Admit: 2014-02-09 | Discharge: 2014-02-09 | Disposition: A | Discharge status: Home / Self Care | Payer: Medicare Other | Source: Ambulatory Visit | Attending: Family Medicine | Admitting: Family Medicine

## 2014-02-09 ENCOUNTER — Telehealth: Payer: Self-pay | Admitting: Family Medicine

## 2014-02-09 ENCOUNTER — Ambulatory Visit (INDEPENDENT_AMBULATORY_CARE_PROVIDER_SITE_OTHER): Payer: Medicare Other | Admitting: Family Medicine

## 2014-02-09 VITALS — BP 110/48 | HR 90 | Temp 98.0°F | Wt 133.2 lb

## 2014-02-09 DIAGNOSIS — R05 Cough: Secondary | ICD-10-CM

## 2014-02-09 DIAGNOSIS — M79662 Pain in left lower leg: Secondary | ICD-10-CM | POA: Diagnosis not present

## 2014-02-09 DIAGNOSIS — R5383 Other fatigue: Secondary | ICD-10-CM

## 2014-02-09 DIAGNOSIS — J929 Pleural plaque without asbestos: Secondary | ICD-10-CM | POA: Diagnosis not present

## 2014-02-09 DIAGNOSIS — I6529 Occlusion and stenosis of unspecified carotid artery: Secondary | ICD-10-CM

## 2014-02-09 DIAGNOSIS — R059 Cough, unspecified: Secondary | ICD-10-CM

## 2014-02-09 DIAGNOSIS — M25562 Pain in left knee: Secondary | ICD-10-CM

## 2014-02-09 DIAGNOSIS — M79652 Pain in left thigh: Secondary | ICD-10-CM | POA: Insufficient documentation

## 2014-02-09 DIAGNOSIS — M25569 Pain in unspecified knee: Secondary | ICD-10-CM | POA: Diagnosis not present

## 2014-02-09 LAB — CBC WITH DIFFERENTIAL/PLATELET
BASOS PCT: 0 % (ref 0–1)
Basophils Absolute: 0 10*3/uL (ref 0.0–0.1)
Eosinophils Absolute: 0.1 10*3/uL (ref 0.0–0.7)
Eosinophils Relative: 1 % (ref 0–5)
HEMATOCRIT: 43.3 % (ref 39.0–52.0)
HEMOGLOBIN: 15.5 g/dL (ref 13.0–17.0)
LYMPHS PCT: 9 % — AB (ref 12–46)
Lymphs Abs: 1 10*3/uL (ref 0.7–4.0)
MCH: 34.4 pg — ABNORMAL HIGH (ref 26.0–34.0)
MCHC: 35.8 g/dL (ref 30.0–36.0)
MCV: 96.2 fL (ref 78.0–100.0)
MONO ABS: 1.1 10*3/uL — AB (ref 0.1–1.0)
MONOS PCT: 10 % (ref 3–12)
MPV: 9.7 fL (ref 9.4–12.4)
NEUTROS ABS: 9.1 10*3/uL — AB (ref 1.7–7.7)
Neutrophils Relative %: 80 % — ABNORMAL HIGH (ref 43–77)
Platelets: 323 10*3/uL (ref 150–400)
RBC: 4.5 MIL/uL (ref 4.22–5.81)
RDW: 13.4 % (ref 11.5–15.5)
WBC: 11.4 10*3/uL — AB (ref 4.0–10.5)

## 2014-02-09 LAB — HEPATIC FUNCTION PANEL
ALT: 31 U/L (ref 0–53)
AST: 26 U/L (ref 0–37)
Albumin: 4.7 g/dL (ref 3.5–5.2)
Alkaline Phosphatase: 57 U/L (ref 39–117)
BILIRUBIN DIRECT: 0.1 mg/dL (ref 0.0–0.3)
BILIRUBIN TOTAL: 0.3 mg/dL (ref 0.2–1.2)
Indirect Bilirubin: 0.2 mg/dL (ref 0.2–1.2)
Total Protein: 7.6 g/dL (ref 6.0–8.3)

## 2014-02-09 LAB — BASIC METABOLIC PANEL
BUN: 18 mg/dL (ref 6–23)
CO2: 25 mEq/L (ref 19–32)
Calcium: 10 mg/dL (ref 8.4–10.5)
Chloride: 95 mEq/L — ABNORMAL LOW (ref 96–112)
Creat: 0.7 mg/dL (ref 0.50–1.35)
GLUCOSE: 99 mg/dL (ref 70–99)
POTASSIUM: 4.4 meq/L (ref 3.5–5.3)
SODIUM: 132 meq/L — AB (ref 135–145)

## 2014-02-09 LAB — POCT HEMOGLOBIN: HEMOGLOBIN: 15.3 g/dL (ref 14.1–18.1)

## 2014-02-09 MED ORDER — KETOROLAC TROMETHAMINE 60 MG/2ML IM SOLN
60.0000 mg | Freq: Once | INTRAMUSCULAR | Status: AC
  Administered 2014-02-09: 60 mg via INTRAMUSCULAR

## 2014-02-09 MED ORDER — HYDROCODONE-ACETAMINOPHEN 7.5-325 MG PO TABS: 1.0000 | ORAL_TABLET | Freq: Four times a day (QID) | ORAL | Status: DC | PRN

## 2014-02-09 MED ORDER — HYDROCODONE-ACETAMINOPHEN 7.5-300 MG PO TABS: ORAL_TABLET | ORAL | Status: DC

## 2014-02-09 NOTE — Progress Notes (Signed)
Pre visit review using our clinic review tool, if applicable. No additional management support is needed unless otherwise documented below in the visit note. 

## 2014-02-09 NOTE — Patient Instructions (Signed)

## 2014-02-09 NOTE — Progress Notes (Signed)
   Subjective:    Patient ID: Ronald Bell, male    DOB: [birth year], 73 y.o.   MRN: [id number]  HPI Pt here c/o pain in knee x1 week that is severe.  No known injury.  Pt saw vascular and doppler done-- neg.  Pain went away and then came back again.   No sob, no cp, no calf pain or swelling.       Review of Systems    as above Objective:   Physical Exam BP 110/48 mmHg  Pulse 90  Temp(Src) 98 F (36.7 C) (Oral)  Wt 133 lb 3.2 oz (60.419 kg)  SpO2 92% General appearance: alert, cooperative, appears stated age and no distress Ears: normal TM's and external ear canals both ears Nose: Nares normal. Septum midline. Mucosa normal. No drainage or sinus tenderness. Throat: lips, mucosa, and tongue normal; teeth and gums normal Neck: no adenopathy, supple, symmetrical, trachea midline and thyroid not enlarged, symmetric, no tenderness/mass/nodules Lungs: diminished breath sounds bilaterally Heart: S1, S2 normal Extremities: extremities normal, atraumatic, no cyanosis or edema--no calf pain        Assessment & Plan:    1. Knee pain, acute, left  - ketorolac (TORADOL) injection 60 mg; Inject 2 mLs (60 mg total) into the muscle once. - DG Knee 1-2 Views Left; Future - Hydrocodone-Acetaminophen 7.5-300 MG TABS; 1 po q6h prn pain  Dispense: 60 each; Refill: 0  2. Pain in joint, lower leg, left  - US Venous Img Lower Unilateral Left; Future=---- neg for DVT Rad tech came up to give results --neg for DVT His wife was not with him in radiology and he did not act like he was in pain in radiology-- no inc sob  3 Cough Check xray - DG Chest 2 View; Future - Basic metabolic panel - CBC with Differential - Hepatic function panel

## 2014-02-09 NOTE — Telephone Encounter (Signed)
Patient Information:  Caller Name: Clarice  Phone: [phone]  Patient: Ronald Bell, Ronald Bell  Gender: Male  DOB: 01/07/1941  Age: 73 Years  PCP: Rosalita Chessman.  Office Follow Up:  Does the office need to follow up with this patient?: No  Instructions For The Office: N/A  RN Note:  Pt. has scheduled appt. at 14:30 today(02/09/14). Wife is upset that they cannot come over now. Husband is taking her Hydrocodone because his is not strong enough.  Symptoms  Reason For Call & Symptoms: Pt. is having pain in his Lt. groin and Lt. knee. No swelling.No redness. No fever. No signs of UTI or hernia. No flank pain. On a scale of 1-10, he is a 10. Pt. with a hx of arthritis. No hx of blood clots. No calf pain.  Reviewed Health History In EMR: Yes  Reviewed Medications In EMR: Yes  Reviewed Allergies In EMR: Yes  Reviewed Surgeries / Procedures: Yes  Date of Onset of Symptoms: 01/26/2014  Treatments Tried: Tramadol and Hydrocodone  Treatments Tried Worked: No  Guideline(s) Used:  Leg Pain  Disposition Per Guideline:   Go to Office Now  Reason For Disposition Reached:   Severe pain (e.g., excruciating, unable to do any normal activities)  Advice Given:  Call Back If:  You become worse.  Patient Will Follow Care Advice:  YES  Appointment Scheduled:  02/09/2014 14:30:00 Appointment Scheduled Provider:  Rosalita Chessman.

## 2014-02-10 ENCOUNTER — Other Ambulatory Visit: Payer: Self-pay | Admitting: Family Medicine

## 2014-02-10 DIAGNOSIS — M25562 Pain in left knee: Secondary | ICD-10-CM

## 2014-02-12 ENCOUNTER — Telehealth: Payer: Self-pay

## 2014-02-12 DIAGNOSIS — Z79891 Long term (current) use of opiate analgesic: Secondary | ICD-10-CM | POA: Diagnosis not present

## 2014-02-12 NOTE — Telephone Encounter (Signed)
[facility]  Triage Call Report Triage Record Num: [id number] Operator: Evorn Gong Patient Name: Ronald Bell Call Date & Time: 02/10/2014 4:16:58PM Patient Phone: [phone] PCP: Rosalita Chessman Patient Gender: Male PCP Fax : [phone] Patient DOB: 1940/04/21 Practice Name: [facility]  Reason for Call: Caller: Marrio/Patient; PCP: Rosalita Chessman.; CB#: [phone]; Call regarding Calling to get results of Xrays taken 02/09/2014. ; Pt calling and states that he was supposed to talk to Dr. Etter Sjogren today for Xray results. EMR reviewed and called Dr. Etter Sjogren who spoke with pt.and gave results  Protocol(s) Used: PCP Calls, No Triage (Adult) Recommended Outcome per Protocol: Call Provider Immediately Reason for Outcome: [1] Caller requests to speak ONLY to PCP AND [2] urgent question  Care Advice: ~

## 2014-02-13 DIAGNOSIS — M25562 Pain in left knee: Secondary | ICD-10-CM | POA: Diagnosis not present

## 2014-02-21 ENCOUNTER — Encounter: Payer: Medicare Other | Admitting: Family Medicine

## 2014-02-22 DIAGNOSIS — M1712 Unilateral primary osteoarthritis, left knee: Secondary | ICD-10-CM | POA: Diagnosis not present

## 2014-02-23 DIAGNOSIS — M25562 Pain in left knee: Secondary | ICD-10-CM | POA: Diagnosis not present

## 2014-02-28 ENCOUNTER — Telehealth: Payer: Self-pay | Admitting: Internal Medicine

## 2014-02-28 NOTE — Telephone Encounter (Signed)
The form was on the fax machine up front  I have placed in Dr Gustavus Bryant lookat for review  Please advise, thanks!

## 2014-02-28 NOTE — Telephone Encounter (Signed)
Pt aware that forms have been filled out and faxed and of recs.  Nothing further needed at this time.

## 2014-02-28 NOTE — Telephone Encounter (Signed)
Spoke with pt, states that Dr. Para March had faxed over a sx clearance for pt's knee sx which is pending scheduling date until he gets clearance from [facility].  I looked and do not see any documentation of any sx clearance on patient.  Magda Paganini or Dr. [name] do you have this form?  Thanks!

## 2014-02-28 NOTE — Telephone Encounter (Signed)
Needs to quit smoking ideally x 2 weeks before elective surgery/ sent recs to Dr Laureen Abrahams office by fax

## 2014-03-01 ENCOUNTER — Telehealth: Payer: Self-pay | Admitting: *Deleted

## 2014-03-01 NOTE — Telephone Encounter (Signed)
Received surgical clearance form via fax from [facility]. Form forwarded to Dr. Etter Sjogren along with 02/09/2014 office note. JG//CMA

## 2014-03-02 ENCOUNTER — Other Ambulatory Visit: Payer: Self-pay | Admitting: Physician Assistant

## 2014-03-02 ENCOUNTER — Other Ambulatory Visit: Payer: Self-pay | Admitting: Internal Medicine

## 2014-03-02 DIAGNOSIS — R918 Other nonspecific abnormal finding of lung field: Secondary | ICD-10-CM | POA: Insufficient documentation

## 2014-03-02 DIAGNOSIS — J920 Pleural plaque with presence of asbestos: Secondary | ICD-10-CM

## 2014-03-05 ENCOUNTER — Encounter: Payer: Self-pay | Admitting: Family Medicine

## 2014-03-05 ENCOUNTER — Ambulatory Visit (INDEPENDENT_AMBULATORY_CARE_PROVIDER_SITE_OTHER): Payer: Medicare Other | Admitting: Family Medicine

## 2014-03-05 VITALS — BP 120/58 | HR 91 | Temp 97.7°F | Wt 131.4 lb

## 2014-03-05 DIAGNOSIS — I10 Essential (primary) hypertension: Secondary | ICD-10-CM

## 2014-03-05 DIAGNOSIS — I6529 Occlusion and stenosis of unspecified carotid artery: Secondary | ICD-10-CM | POA: Diagnosis not present

## 2014-03-05 DIAGNOSIS — Z87438 Personal history of other diseases of male genital organs: Secondary | ICD-10-CM | POA: Diagnosis not present

## 2014-03-05 DIAGNOSIS — I6523 Occlusion and stenosis of bilateral carotid arteries: Secondary | ICD-10-CM | POA: Diagnosis not present

## 2014-03-05 DIAGNOSIS — Z01810 Encounter for preprocedural cardiovascular examination: Secondary | ICD-10-CM

## 2014-03-05 DIAGNOSIS — E785 Hyperlipidemia, unspecified: Secondary | ICD-10-CM | POA: Diagnosis not present

## 2014-03-05 LAB — POCT URINALYSIS DIPSTICK
Bilirubin, UA: NEGATIVE
Blood, UA: NEGATIVE
GLUCOSE UA: NEGATIVE
Ketones, UA: NEGATIVE
Leukocytes, UA: NEGATIVE
NITRITE UA: NEGATIVE
Protein, UA: NEGATIVE
Spec Grav, UA: <=1.005
UROBILINOGEN UA: 0.2
pH, UA: 7

## 2014-03-05 NOTE — Progress Notes (Addendum)
Subjective:    Ronald Bell is a 73 y.o. male who presents to the office today for a preoperative consultation at the request of surgeon Dr Percell Miller who plans on performing Left knee scope on TBA.   This consultation is requested for the specific conditions prompting preoperative evaluation (i.e. because of potential affect on operative risk): carotid stenosis, htn, hyperlipidemia, . Planned anesthesia: general. The patient has the following known anesthesia issues: none. Patients bleeding risk: recent abnormal bleeding (angiodysplasia). Patient does not have objections to receiving blood products if needed.  The following portions of the patient's history were reviewed and updated as appropriate:  He  has a past medical history of Other symptoms involving cardiovascular system; Polyp of nasal cavity; Other specified disorders of arteries and arterioles; Cervicalgia; Routine general medical examination at a health care facility; Rosacea; Benign neoplasm of colon; Personal history of tobacco use, presenting hazards to health; Personal history of other diseases of digestive system; Psychosexual dysfunction with inhibited sexual excitement; Chronic airway obstruction, not elsewhere classified; Unspecified essential hypertension; Other and unspecified hyperlipidemia; GERD (gastroesophageal reflux disease); Anemia; Arthritis; Blood transfusion (2010); Adenomatous colon polyp; Peripheral vascular disease; Irregular heartbeat; Shingles; Carotid artery occlusion; and Angiodysplasia of intestine with hemorrhage. He  does not have any pertinent problems on file. He  has past surgical history that includes Colon surgery; Iliac artery stent (Left, 2005); Esophagogastroduodenoscopy (2012); and Colonoscopy (July 2015). His family history includes Colitis in his father; Heart disease in his brother and daughter; Hyperlipidemia in his brother; Hypertension in his brother and son; Lung disease in his mother; Uterine  cancer in an other family member. There is no history of Colon cancer. He  reports that he has been smoking Cigarettes and E-cigarettes.  He has a 41.25 pack-year smoking history. He has never used smokeless tobacco. He reports that he does not drink alcohol or use illicit drugs. He has a current medication list which includes the following prescription(s): amlodipine, vitamin c, aspirin, atorvastatin, diclofenac sodium, docusate sodium, iron, fluticasone furoate-vilanterol, ipratropium-albuterol, mometasone, multiple vitamins-minerals, oxycodone-acetaminophen, pantoprazole, and valsartan-hydrochlorothiazide. Current Outpatient Prescriptions on File Prior to Visit  Medication Sig Dispense Refill  . amLODipine (NORVASC) 10 MG tablet TAKE 1 TABLET (10 MG TOTAL) BY MOUTH DAILY. 90 tablet 3  . Ascorbic Acid (VITAMIN C) 250 MG tablet Take 500 mg by mouth daily.     Marland Kitchen aspirin 81 MG EC tablet Take 81 mg by mouth daily.      Marland Kitchen atorvastatin (LIPITOR) 40 MG tablet Take 1 tablet (40 mg total) by mouth daily. 90 tablet 3  . diclofenac sodium (VOLTAREN) 1 % GEL Apply 2 g topically 4 (four) times daily. 100 g 5  . DOCUSATE SODIUM PO Take 1 tablet by mouth 2 (two) times daily.     . Ferrous Sulfate (IRON) 325 (65 FE) MG TABS Take 1 tablet by mouth 2 (two) times daily. 90 each 3  . Fluticasone Furoate-Vilanterol (BREO ELLIPTA) 100-25 MCG/INH AEPB Inhale 1 puff into the lungs daily. 3 each 3  . Ipratropium-Albuterol (COMBIVENT RESPIMAT) 20-100 MCG/ACT AERS respimat Inhale 1 puff into the lungs 4 (four) times daily as needed for wheezing. 1 Inhaler 3  . mometasone (ELOCON) 0.1 % cream Apply 1 application topically daily. 45 g 3  . Multiple Vitamins-Minerals (CENTRUM SILVER PO) Take by mouth daily.      . pantoprazole (PROTONIX) 40 MG tablet TAKE ONE TABLET BY MOUTH ONCE DAILY 90 tablet 3  . valsartan-hydrochlorothiazide (DIOVAN HCT) 160-12.5 MG per  tablet Take 1 tablet by mouth daily. 90 tablet 3   No current  facility-administered medications on file prior to visit.   He has No Known Allergies..  Review of Systems Pertinent items are noted in HPI.    Objective:    BP 120/58 mmHg  Pulse 91  Temp(Src) 97.7 F (36.5 C) (Oral)  Wt 131 lb 6.4 oz (59.603 kg)  SpO2 89% General appearance: alert, cooperative, appears stated age and no distress Eyes: negative findings: lids and lashes normal, conjunctivae and sclerae normal and pupils equal, round, reactive to light and accomodation Ears: normal TM's and external ear canals both ears Nose: Nares normal. Septum midline. Mucosa normal. No drainage or sinus tenderness. Throat: lips, mucosa, and tongue normal; teeth and gums normal Neck: no adenopathy, no JVD, supple, symmetrical, trachea midline, thyroid not enlarged, symmetric, no tenderness/mass/nodules and   + b/l carotid bruit Lungs: clear to auscultation bilaterally Heart: S1, S2 normal-- + murmur Abdomen: soft, non-tender; bowel sounds normal; no masses,  no organomegaly Extremities: extremities normal, atraumatic, no cyanosis or edema--+ R knee pain, swelling Pulses: 2+ and symmetric Skin: Skin color, texture, turgor normal. No rashes or lesions Lymph nodes: Cervical, supraclavicular, and axillary nodes normal.   Cardiographics ECG: normal sinus rhythm, no blocks or conduction defects, no ischemic changes Echocardiogram: not done  Imaging Chest x-ray:  CLINICAL DATA: Cough, left groin pain  EXAM: CHEST 2 VIEW  COMPARISON: CT 09/08/2013, radiograph 03/03/2013  FINDINGS: Normal cardiac silhouette. There is extensive pleural plaques within the left and right lung not changed from comparison CT. There is central bronchovascular thickening is also unchanged. Lungs are hyperinflated. Calcification aorta noted.  IMPRESSION: 1. No acute cardiopulmonary process. 2. Hyperinflated lungs with chronic bronchitic markings. 3. Extensive pleural plaques noted.   Electronically  Signed  By: Suzy Bouchard M.D.  On: 02/09/2014 17:38  Lab Review  Office Visit on 02/09/2014  Component Date Value  . Hemoglobin 02/09/2014 15.3   . Total Bilirubin 02/09/2014 0.3   . Bilirubin, Direct 02/09/2014 0.1   . Indirect Bilirubin 02/09/2014 0.2   . Alkaline Phosphatase 02/09/2014 57   . AST 02/09/2014 26   . ALT 02/09/2014 31   . Total Protein 02/09/2014 7.6   . Albumin 02/09/2014 4.7   . WBC 02/09/2014 11.4*  . RBC 02/09/2014 4.50   . Hemoglobin 02/09/2014 15.5   . HCT 02/09/2014 43.3   . MCV 02/09/2014 96.2   . [employer] 02/09/2014 34.4*  . MCHC 02/09/2014 35.8   . RDW 02/09/2014 13.4   . Platelets 02/09/2014 323   . MPV 02/09/2014 9.7   . Neutrophils Relative % 02/09/2014 80*  . Neutro Abs 02/09/2014 9.1*  . Lymphocytes Relative 02/09/2014 9*  . Lymphs Abs 02/09/2014 1.0   . Monocytes Relative 02/09/2014 10   . Monocytes Absolute 02/09/2014 1.1*  . Eosinophils Relative 02/09/2014 1   . Eosinophils Absolute 02/09/2014 0.1   . Basophils Relative 02/09/2014 0   . Basophils Absolute 02/09/2014 0.0   . Smear Review 02/09/2014 Criteria for review not met   . Sodium 02/09/2014 132*  . Potassium 02/09/2014 4.4   . Chloride 02/09/2014 95*  . CO2 02/09/2014 25   . Glucose, Bld 02/09/2014 99   . BUN 02/09/2014 18   . Creat 02/09/2014 0.70   . Calcium 02/09/2014 10.0       Assessment:      73 y.o. male with planned surgery as above.   Known risk factors for perioperative complications: age, carotid  stenosis, CAD, copd   Difficulty with intubation is not anticipated.      Plan:    1. Preoperative workup as follows ECG, hemoglobin, hematocrit, electrolytes, creatinine, glucose, liver function studies.-- cardiac clearance, pulmonary recommendations sent  2. Change in medication regimen before surgery: per surgical team. 3. Prophylaxis for cardiac events with perioperative beta-blockers: per cardiology. 4. Invasive hemodynamic monitoring perioperatively: at the  discretion of anesthesiologist. 5. Deep vein thrombosis prophylaxis postoperatively:regimen to be chosen by surgical team. 6. Surveillance for postoperative MI with ECG immediately postoperatively and on postoperative days 1 and 2 AND troponin levels 24 hours postoperatively and on day 4 or hospital discharge (whichever comes first): at the discretion of anesthesiologist. 7. Other measures: triad hospitalist consult prn

## 2014-03-05 NOTE — Progress Notes (Signed)
Pre visit review using our clinic review tool, if applicable. No additional management support is needed unless otherwise documented below in the visit note. 

## 2014-03-06 ENCOUNTER — Ambulatory Visit (INDEPENDENT_AMBULATORY_CARE_PROVIDER_SITE_OTHER): Payer: Medicare Other | Admitting: Cardiology

## 2014-03-06 ENCOUNTER — Telehealth: Payer: Self-pay | Admitting: Cardiology

## 2014-03-06 ENCOUNTER — Encounter: Payer: Self-pay | Admitting: Cardiology

## 2014-03-06 VITALS — BP 130/50 | HR 86 | Ht 63.5 in | Wt 132.0 lb

## 2014-03-06 DIAGNOSIS — M25562 Pain in left knee: Secondary | ICD-10-CM | POA: Diagnosis not present

## 2014-03-06 DIAGNOSIS — J449 Chronic obstructive pulmonary disease, unspecified: Secondary | ICD-10-CM

## 2014-03-06 DIAGNOSIS — Z01810 Encounter for preprocedural cardiovascular examination: Secondary | ICD-10-CM

## 2014-03-06 DIAGNOSIS — I6529 Occlusion and stenosis of unspecified carotid artery: Secondary | ICD-10-CM | POA: Diagnosis not present

## 2014-03-06 DIAGNOSIS — I739 Peripheral vascular disease, unspecified: Secondary | ICD-10-CM

## 2014-03-06 DIAGNOSIS — R011 Cardiac murmur, unspecified: Secondary | ICD-10-CM

## 2014-03-06 LAB — CBC WITH DIFFERENTIAL/PLATELET
Basophils Absolute: 0.1 10*3/uL (ref 0.0–0.1)
Basophils Relative: 0.5 % (ref 0.0–3.0)
EOS PCT: 3.5 % (ref 0.0–5.0)
Eosinophils Absolute: 0.4 10*3/uL (ref 0.0–0.7)
HEMATOCRIT: 43.8 % (ref 39.0–52.0)
Hemoglobin: 14.5 g/dL (ref 13.0–17.0)
LYMPHS ABS: 1 10*3/uL (ref 0.7–4.0)
Lymphocytes Relative: 9 % — ABNORMAL LOW (ref 12.0–46.0)
MCHC: 33.1 g/dL (ref 30.0–36.0)
MCV: 101.3 fl — AB (ref 78.0–100.0)
MONO ABS: 0.9 10*3/uL (ref 0.1–1.0)
Monocytes Relative: 8.3 % (ref 3.0–12.0)
Neutro Abs: 8.7 10*3/uL — ABNORMAL HIGH (ref 1.4–7.7)
Neutrophils Relative %: 78.7 % — ABNORMAL HIGH (ref 43.0–77.0)
Platelets: 267 10*3/uL (ref 150.0–400.0)
RBC: 4.33 Mil/uL (ref 4.22–5.81)
RDW: 13.8 % (ref 11.5–15.5)
WBC: 11.1 10*3/uL — AB (ref 4.0–10.5)

## 2014-03-06 LAB — HEPATIC FUNCTION PANEL
ALBUMIN: 4.8 g/dL (ref 3.5–5.2)
ALT: 26 U/L (ref 0–53)
AST: 18 U/L (ref 0–37)
Alkaline Phosphatase: 49 U/L (ref 39–117)
Bilirubin, Direct: 0 mg/dL (ref 0.0–0.3)
TOTAL PROTEIN: 8.1 g/dL (ref 6.0–8.3)
Total Bilirubin: 0.4 mg/dL (ref 0.2–1.2)

## 2014-03-06 LAB — LIPID PANEL
Cholesterol: 157 mg/dL (ref 0–200)
HDL: 44.8 mg/dL (ref >39.00)
LDL Cholesterol: 76 mg/dL (ref 0–99)
NonHDL: 112.2
TRIGLYCERIDES: 179 mg/dL — AB (ref 0.0–149.0)
Total CHOL/HDL Ratio: 4
VLDL: 35.8 mg/dL (ref 0.0–40.0)

## 2014-03-06 LAB — BASIC METABOLIC PANEL
BUN: 17 mg/dL (ref 6–23)
CHLORIDE: 105 meq/L (ref 96–112)
CO2: 24 mEq/L (ref 19–32)
Calcium: 10.4 mg/dL (ref 8.4–10.5)
Creatinine, Ser: 0.7 mg/dL (ref 0.4–1.5)
GFR: 112.94 mL/min (ref >60.00)
Glucose, Bld: 96 mg/dL (ref 70–99)
POTASSIUM: 4.6 meq/L (ref 3.5–5.1)
Sodium: 139 mEq/L (ref 135–145)

## 2014-03-06 LAB — MICROALBUMIN / CREATININE URINE RATIO
Creatinine,U: 11.4 mg/dL
MICROALB/CREAT RATIO: 9.7 mg/g (ref 0.0–30.0)
Microalb, Ur: 1.1 mg/dL (ref 0.0–1.9)

## 2014-03-06 NOTE — Telephone Encounter (Signed)
Incoming records received on 12.15.15 from [place] for today's appointment with Dr. Marlou Porch: Given to the chart prep team:djc

## 2014-03-06 NOTE — Progress Notes (Addendum)
[facility]. [address], [address] [place], [place]  [address] Phone: [phone] Fax:  [phone]  Date:  03/06/2014   ID:  Ronald Bell, Ronald Bell [birth year], MRN [id number]  PCP:  [name], DO   History of Present Illness: Ronald Bell is a 73 y.o. male here for preoperative risk stratification prior to left knee arthroscopically by Dr. Percell Miller. Has moderate aortic stenosis. Has a family history of heart disease with his brother and daughter. He carries diagnoses of hypertension and hyperlipidemia. Smoker. Chest x-ray demonstrates no acute findings. Hyperinflated lungs. Extensive pleural plaques noted. Coronary artery calcifications noted on CT scan 09/08/13 as well as aortic atherosclerosis.  Overall he is doing well without any increasing anginal symptoms, no shortness of breath other than baseline COPD, no syncope, no orthopnea. Weight has been stable.  He has been battling left knee pain.  Had prior nuclear stress test less than 5 years ago.  No ischemia reported.   Wt Readings from Last 3 Encounters:  03/06/14 132 lb (59.875 kg)  03/05/14 131 lb 6.4 oz (59.603 kg)  02/09/14 133 lb 3.2 oz (60.419 kg)     Past Medical History  Diagnosis Date  . Other symptoms involving cardiovascular system   . Polyp of nasal cavity   . Other specified disorders of arteries and arterioles   . Cervicalgia   . Routine general medical examination at a health care facility   . Rosacea   . Benign neoplasm of colon   . Personal history of tobacco use, presenting hazards to health   . Personal history of other diseases of digestive system   . Psychosexual dysfunction with inhibited sexual excitement   . Chronic airway obstruction, not elsewhere classified   . Unspecified essential hypertension   . Other and unspecified hyperlipidemia   . GERD (gastroesophageal reflux disease)   . Anemia   . Arthritis   . Blood transfusion 2010    4 units  . Adenomatous colon polyp   .  Peripheral vascular disease   . Irregular heartbeat   . Shingles   . Carotid artery occlusion   . Angiodysplasia of intestine with hemorrhage     Past Surgical History  Procedure Laterality Date  . Colon surgery    . Iliac artery stent Left 2005    CIA  . Esophagogastroduodenoscopy  2012    normal  . Colonoscopy  July 2015    Dr. Henrene Pastor    Current Outpatient Prescriptions  Medication Sig Dispense Refill  . amLODipine (NORVASC) 10 MG tablet TAKE 1 TABLET (10 MG TOTAL) BY MOUTH DAILY. 90 tablet 3  . Ascorbic Acid (VITAMIN C) 250 MG tablet Take 500 mg by mouth daily.     Marland Kitchen aspirin 81 MG EC tablet Take 81 mg by mouth daily.      Marland Kitchen atorvastatin (LIPITOR) 40 MG tablet Take 1 tablet (40 mg total) by mouth daily. 90 tablet 3  . diclofenac sodium (VOLTAREN) 1 % GEL Apply 2 g topically 4 (four) times daily. 100 g 5  . DOCUSATE SODIUM PO Take 1 tablet by mouth 2 (two) times daily.     . Ferrous Sulfate (IRON) 325 (65 FE) MG TABS Take 1 tablet by mouth 2 (two) times daily. 90 each 3  . Fluticasone Furoate-Vilanterol (BREO ELLIPTA) 100-25 MCG/INH AEPB Inhale 1 puff into the lungs daily. 3 each 3  . Ipratropium-Albuterol (COMBIVENT RESPIMAT) 20-100 MCG/ACT AERS respimat Inhale 1 puff into the lungs 4 (four)  times daily as needed for wheezing. 1 Inhaler 3  . mometasone (ELOCON) 0.1 % cream Apply 1 application topically daily. 45 g 3  . Multiple Vitamins-Minerals (CENTRUM SILVER PO) Take by mouth daily.      . pantoprazole (PROTONIX) 40 MG tablet TAKE ONE TABLET BY MOUTH ONCE DAILY 90 tablet 3  . valsartan-hydrochlorothiazide (DIOVAN HCT) 160-12.5 MG per tablet Take 1 tablet by mouth daily. 90 tablet 3   No current facility-administered medications for this visit.    Allergies:   No Known Allergies  Social History:  The patient  reports that he has been smoking Cigarettes and E-cigarettes.  He has a 41.25 pack-year smoking history. He has never used smokeless tobacco. He reports that he does  not drink alcohol or use illicit drugs.   Family History  Problem Relation Age of Onset  . Lung disease Mother     pulm fibrosis  . Uterine cancer    . Colitis Father   . Colon cancer Neg Hx   . Heart disease Brother   . Hypertension Brother   . Hyperlipidemia Brother   . Heart disease Daughter     cad  . Hypertension Son     ROS:  Please see the history of present illness.   Positive for knee pain, left. He denies any syncope, bleeding, orthopnea, PND, anginal symptoms. He has baseline shortness of breath with COPD. This has not increased recently. No strokelike symptoms, Dr. Donnetta Hutching.   All other systems reviewed and negative.   PHYSICAL EXAM: VS:  BP 130/50 mmHg  Pulse 86  Ht 5' 3.5" (1.613 m)  Wt 132 lb (59.875 kg)  BMI 23.01 kg/m2  SpO2 90% Well nourished, well developed, in no acute distressUsing a cane HEENT: normal, [place]/AT, EOMI Neck: no JVD, normal carotid upstroke, Bilat bruit, loud Cardiac:  normal S1, S2; RRR; 3/6 systolic right upper sternal border murmur Lungs:  Diminished breath sounds bilaterally however clear to auscultation bilaterally, no wheezing, rhonchi or rales Abd: soft, nontender, no hepatomegaly, no bruits Ext: no edema, 2+ distal pulses Skin: warm and dry GU: deferred Neuro: no focal abnormalities noted, AAO x 3  EKG:    03/05/14-sinus rhythm, 84, no other significant abnormalities noted. Echocardiogram: November/2013-normal ejection fraction 65% with moderate aortic stenosis 3.4 m/s, 28 mmHg mean gradient. Mild aortic valve regurgitation. Nuclear stress test 02/2011-low risk, no ischemia  Cardiac catheterization 2001- showed diffuse 40% stenosis of LAD, heavily calcified in midportion, circumflex calcification, right coronary artery 30-45% in the mid and proximal segment, subclavian 30-40% stenosis left as well as calcification of the ascending arch. Left internal iliac 50-60% stenosis, right internal iliac occluded, fed by collateral flow. 50% left  renal artery stenosis.  ASSESSMENT AND PLAN:  1. Preoperative risk stratification prior to general anesthesia for knee arthroscopy- since he has had a nuclear stress test less than 5 years ago and is having no advancing anginal symptoms, no high-risk symptoms such as syncope or heart failure and does not appear to have severe aortic stenosis by medical exam, he may proceed with knee surgery on Thursday with mild to moderate overall cardiovascular risk. He does have peripheral vascular disease, carotid artery disease. Aortic valve murmur appreciated. We will be checking echocardiogram however this does not need to be done prior to surgery. Please let us know if we can be of any assistance perioperatively. 2. Carotid artery disease-Dr. Donnetta Hutching. Doing well. 3. Essential hypertension-currently well controlled. Medications reviewed. 4. Hyperlipidemia-excellent use of statin with his peripheral  vascular disease. 5. COPD-stable. 6. Aortic stenosis-we will repeat echocardiogram. It does not appear to sound severe based upon physical exam. Previously described as moderate with peak velocity 3.4 m/s and mean gradient of 20 mmHg with normal ejection fraction of 65%. This was in November 2013. 7. Six-month follow-up.  Signed, Candee Furbish, MD [facility]  03/06/2014 10:45 AM

## 2014-03-06 NOTE — Patient Instructions (Signed)
The current medical regimen is effective;  continue present plan and medications.  Your physician has requested that you have an echocardiogram. Echocardiography is a painless test that uses sound waves to create images of your heart. It provides your doctor with information about the size and shape of your heart and how well your heart's chambers and valves are working. This procedure takes approximately one hour. There are no restrictions for this procedure.  You have been cleared for surgery.  Follow up in 6 months with Dr. Marlou Porch.  You will receive a letter in the mail 2 months before you are due.  Please call us when you receive this letter to schedule your follow up appointment.  Thank you for choosing [facility]!!

## 2014-03-07 ENCOUNTER — Encounter (HOSPITAL_COMMUNITY): Payer: Self-pay | Admitting: *Deleted

## 2014-03-07 MED ORDER — CHLORHEXIDINE GLUCONATE 4 % EX LIQD
60.0000 mL | Freq: Once | CUTANEOUS | Status: DC
  Filled 2014-03-07: qty 60

## 2014-03-07 MED ORDER — CEFAZOLIN SODIUM-DEXTROSE 2-3 GM-% IV SOLR
2.0000 g | INTRAVENOUS | Status: AC
  Administered 2014-03-08: 2 g via INTRAVENOUS
  Filled 2014-03-07: qty 50

## 2014-03-07 MED ORDER — LACTATED RINGERS IV SOLN: INTRAVENOUS | Status: DC

## 2014-03-07 NOTE — H&P (Signed)
[facility] [address] [place], [place] [address] [phone] A Division of [facility]  Ninetta Lights, M.D.   Robert A. Noemi Chapel, M.D.   Faythe Casa, M.D.   Johnny Bridge, M.D.   Almedia Balls, M.D Ernesta Amble. Percell Miller, M.D.  Joseph Pierini, M.D.  Lanier Prude, M.D.    Verner Chol, M.D. Mary L. Fenton Malling, PA-C  Kirstin A. Shepperson, PA-C  Josh Hampton, PA-C [place], [place]   RE: Ronald Bell, Ronald Bell                                [phone]      DOB: 12/20/1940 PROGRESS NOTE: 02-13-14 Quandarius is a 73 year-old male who presents for left knee pain.  Relatively acute onset three weeks ago.  Very localized medially.  He gets a knot and swelling on the medial side.  Worse with vertical load pivoting squats.  No specific injury.  He has had some mild off and on symptoms medially in the past.  Tolerable until three weeks ago.   Medical history is reviewed, updated and included in the chart.  Followed by Dr. Etter Sjogren in [name].  Significant history of COPD.  Not on oxygen, but short of breath even with minimal activity.    EXAMINATION: General exam is outlined and included in the chart.  Obvious COPD.  No distal edema.  Antalgic gait, left.  Left knee trace effusion.  Point tender medial joint line.  Positive McMurray's.  Full motion.  Stable ligaments.  Neurovascularly intact distally.  Right knee has no joint line tenderness.  Negative log roll of both hips.  Negative straight leg raise, both sides.    X-RAYS: Four view standing x-rays show some mild changes medial compartment, both knees, nothing extreme. No fractures.    IMPRESSION: Degenerative medial meniscus tear, left knee.  PLAN: I am not sure how good an operative candidate he is based on his COPD.  We are going to try an intraarticular Cortisone injection to see how he does.  If things don't improve he is to call and I will get an MRI to  look at what he has.  Follow up with him after that.  He will let me know.  If we end up having to treat his meniscus tear we are going to have to get medical clearance. Of note, arthroscopic treatment of his left shoulder by me back in 2008.  This continues to do well with no issues.    PROCEDURE NOTE: The patient's clinical condition is marked by substantial pain and/or significant functional disability.  Other conservative therapy has not provided relief, is contraindicated, or not appropriate.  There is a reasonable likelihood that injection will significantly improve the patient's pain and/or functional disability. After appropriate consent and under sterile technique intraarticular injection of the left knee with Depo-Medrol/Marcaine.  Tolerated this well.  Wait to hear from him.       Ninetta Lights, M.D. Electronically verified by Ninetta Lights, M.D. DFM:jjh Cc: Dr. [name], fax: [phone] D 11-25-15[facility] [address] [place], [place] [address] [phone] A Division of [facility]  Ninetta Lights, M.D.   Robert A. Noemi Chapel, M.D.   Faythe Casa, M.D.   Johnny Bridge, M.D.   Almedia Balls, M.D Ernesta Amble. Percell Miller, M.D.  Joseph Pierini, M.D.  Lanier Prude, M.D.    Verner Chol, M.D. Mary L. Fenton Malling, PA-C  Kirstin A. Shepperson, PA-C  Josh Hume, PA-C [place], [place]   RE: [place], [place]   [id number]      DOB: 1941/02/27 PROGRESS NOTE: 02-23-14 This is a 73 year old male here to discuss left knee MRI results. He had acute onset of medial left knee pain a little over a month ago without a specific injury. We ordered a left knee MRI which showed a posterior horn medial meniscus tear, all other compartments have mild degenerative changes. We will proceed with left knee arthroscopy with medial meniscectomy and chondroplasty. He has significant COPD and we will need clearance from  his primary care physician prior to surgery. Paperwork has been completed. Discussed risks benefits and possible complications as well as rehab/recovery time. All questions answered.  Ninetta Lights, M.D.  Electronically verified by Ninetta Lights, M.D. DFM(MLA):kah D 02-23-14 T 02-27-14  T 02-19-14

## 2014-03-07 NOTE — Progress Notes (Signed)
Reviewed notes from Dr Marlou Porch from 12/15-dr fitzgerald says he needs to be done main or due to moderate aortic stenosis Dr Jamison Oka office called

## 2014-03-08 ENCOUNTER — Ambulatory Visit (HOSPITAL_COMMUNITY)
Admission: RE | Admit: 2014-03-08 | Discharge: 2014-03-08 | Disposition: A | Discharge status: Home / Self Care | Payer: Medicare Other | Source: Ambulatory Visit | Attending: Orthopedic Surgery | Admitting: Orthopedic Surgery

## 2014-03-08 ENCOUNTER — Ambulatory Visit (HOSPITAL_COMMUNITY): Payer: Medicare Other | Admitting: Certified Registered"

## 2014-03-08 ENCOUNTER — Encounter (HOSPITAL_COMMUNITY)
Admission: RE | Disposition: A | Discharge status: Home / Self Care | Payer: Self-pay | Source: Ambulatory Visit | Attending: Orthopedic Surgery

## 2014-03-08 ENCOUNTER — Encounter (HOSPITAL_COMMUNITY): Payer: Self-pay | Admitting: *Deleted

## 2014-03-08 DIAGNOSIS — X58XXXA Exposure to other specified factors, initial encounter: Secondary | ICD-10-CM | POA: Diagnosis not present

## 2014-03-08 DIAGNOSIS — I739 Peripheral vascular disease, unspecified: Secondary | ICD-10-CM | POA: Diagnosis not present

## 2014-03-08 DIAGNOSIS — S83209A Unspecified tear of unspecified meniscus, current injury, unspecified knee, initial encounter: Secondary | ICD-10-CM | POA: Diagnosis not present

## 2014-03-08 DIAGNOSIS — M1990 Unspecified osteoarthritis, unspecified site: Secondary | ICD-10-CM | POA: Insufficient documentation

## 2014-03-08 DIAGNOSIS — F17210 Nicotine dependence, cigarettes, uncomplicated: Secondary | ICD-10-CM | POA: Insufficient documentation

## 2014-03-08 DIAGNOSIS — M2242 Chondromalacia patellae, left knee: Secondary | ICD-10-CM | POA: Diagnosis not present

## 2014-03-08 DIAGNOSIS — S83242A Other tear of medial meniscus, current injury, left knee, initial encounter: Secondary | ICD-10-CM | POA: Diagnosis not present

## 2014-03-08 DIAGNOSIS — K219 Gastro-esophageal reflux disease without esophagitis: Secondary | ICD-10-CM | POA: Insufficient documentation

## 2014-03-08 DIAGNOSIS — J449 Chronic obstructive pulmonary disease, unspecified: Secondary | ICD-10-CM | POA: Diagnosis not present

## 2014-03-08 DIAGNOSIS — G8918 Other acute postprocedural pain: Secondary | ICD-10-CM | POA: Diagnosis not present

## 2014-03-08 DIAGNOSIS — I10 Essential (primary) hypertension: Secondary | ICD-10-CM | POA: Diagnosis not present

## 2014-03-08 DIAGNOSIS — Y9389 Activity, other specified: Secondary | ICD-10-CM | POA: Diagnosis not present

## 2014-03-08 HISTORY — PX: KNEE ARTHROSCOPY WITH MEDIAL MENISECTOMY: SHX5651

## 2014-03-08 SURGERY — ARTHROSCOPY, KNEE, WITH MEDIAL MENISCECTOMY
Anesthesia: General | Site: Knee | Laterality: Left

## 2014-03-08 MED ORDER — LACTATED RINGERS IV SOLN
INTRAVENOUS | Status: DC
  Administered 2014-03-08: 10:00:00 via INTRAVENOUS

## 2014-03-08 MED ORDER — FENTANYL CITRATE 0.05 MG/ML IJ SOLN: 100.0000 ug | Freq: Once | INTRAMUSCULAR | Status: DC

## 2014-03-08 MED ORDER — MIDAZOLAM HCL 5 MG/5ML IJ SOLN
INTRAMUSCULAR | Status: DC | PRN
  Administered 2014-03-08: 1 mg via INTRAVENOUS

## 2014-03-08 MED ORDER — PROPOFOL 10 MG/ML IV BOLUS
INTRAVENOUS | Status: DC | PRN
  Administered 2014-03-08: 70 mg via INTRAVENOUS

## 2014-03-08 MED ORDER — METOCLOPRAMIDE HCL 5 MG PO TABS
5.0000 mg | ORAL_TABLET | Freq: Three times a day (TID) | ORAL | Status: DC | PRN
Start: 2014-03-08 — End: 2014-03-08
  Filled 2014-03-08: qty 2

## 2014-03-08 MED ORDER — METHYLPREDNISOLONE ACETATE 40 MG/ML IJ SUSP
INTRAMUSCULAR | Status: AC
  Filled 2014-03-08: qty 1

## 2014-03-08 MED ORDER — LIDOCAINE-EPINEPHRINE (PF) 1.5 %-1:200000 IJ SOLN
INTRAMUSCULAR | Status: DC | PRN
  Administered 2014-03-08: 20 mL

## 2014-03-08 MED ORDER — HYDROMORPHONE HCL 1 MG/ML IJ SOLN: 0.5000 mg | INTRAMUSCULAR | Status: DC | PRN

## 2014-03-08 MED ORDER — ONDANSETRON HCL 4 MG PO TABS
4.0000 mg | ORAL_TABLET | Freq: Four times a day (QID) | ORAL | Status: DC | PRN
  Filled 2014-03-08: qty 1

## 2014-03-08 MED ORDER — PROMETHAZINE HCL 25 MG/ML IJ SOLN: 6.2500 mg | INTRAMUSCULAR | Status: DC | PRN

## 2014-03-08 MED ORDER — BUPIVACAINE-EPINEPHRINE 0.5% -1:200000 IJ SOLN
INTRAMUSCULAR | Status: DC | PRN
  Administered 2014-03-08: 20 mL

## 2014-03-08 MED ORDER — ALBUTEROL SULFATE (2.5 MG/3ML) 0.083% IN NEBU
INHALATION_SOLUTION | RESPIRATORY_TRACT | Status: AC
  Filled 2014-03-08: qty 3

## 2014-03-08 MED ORDER — FENTANYL CITRATE 0.05 MG/ML IJ SOLN: 25.0000 ug | INTRAMUSCULAR | Status: DC | PRN

## 2014-03-08 MED ORDER — MIDAZOLAM HCL 2 MG/2ML IJ SOLN: 2.0000 mg | Freq: Once | INTRAMUSCULAR | Status: DC

## 2014-03-08 MED ORDER — LACTATED RINGERS IV SOLN
INTRAVENOUS | Status: DC | PRN
  Administered 2014-03-08 (×2): via INTRAVENOUS

## 2014-03-08 MED ORDER — LIDOCAINE HCL (CARDIAC) 20 MG/ML IV SOLN
INTRAVENOUS | Status: DC | PRN
  Administered 2014-03-08: 40 mg via INTRAVENOUS

## 2014-03-08 MED ORDER — METOCLOPRAMIDE HCL 5 MG/ML IJ SOLN
5.0000 mg | Freq: Three times a day (TID) | INTRAMUSCULAR | Status: DC | PRN
Start: 2014-03-08 — End: 2014-03-08
  Filled 2014-03-08: qty 2

## 2014-03-08 MED ORDER — FENTANYL CITRATE 0.05 MG/ML IJ SOLN
INTRAMUSCULAR | Status: AC
  Administered 2014-03-08: 50 ug
  Filled 2014-03-08: qty 2

## 2014-03-08 MED ORDER — ONDANSETRON HCL 4 MG/2ML IJ SOLN
4.0000 mg | Freq: Four times a day (QID) | INTRAMUSCULAR | Status: DC | PRN
  Filled 2014-03-08: qty 2

## 2014-03-08 MED ORDER — MIDAZOLAM HCL 2 MG/2ML IJ SOLN
INTRAMUSCULAR | Status: AC
  Administered 2014-03-08: 1 mg
  Filled 2014-03-08: qty 2

## 2014-03-08 MED ORDER — FENTANYL CITRATE 0.05 MG/ML IJ SOLN
INTRAMUSCULAR | Status: AC
  Filled 2014-03-08: qty 5

## 2014-03-08 MED ORDER — PROPOFOL 10 MG/ML IV BOLUS
INTRAVENOUS | Status: AC
  Filled 2014-03-08: qty 20

## 2014-03-08 MED ORDER — EPHEDRINE SULFATE 50 MG/ML IJ SOLN
INTRAMUSCULAR | Status: DC | PRN
  Administered 2014-03-08: 10 mg via INTRAVENOUS
  Administered 2014-03-08: 5 mg via INTRAVENOUS
  Administered 2014-03-08: 10 mg via INTRAVENOUS

## 2014-03-08 MED ORDER — MIDAZOLAM HCL 2 MG/2ML IJ SOLN
INTRAMUSCULAR | Status: AC
  Filled 2014-03-08: qty 2

## 2014-03-08 MED ORDER — DEXTROSE 5 % IV SOLN
10.0000 mg | INTRAVENOUS | Status: DC | PRN
  Administered 2014-03-08: 60 ug/min via INTRAVENOUS
  Administered 2014-03-08: 80 ug/min via INTRAVENOUS
  Administered 2014-03-08: 40 ug/min via INTRAVENOUS

## 2014-03-08 MED ORDER — ALBUMIN HUMAN 5 % IV SOLN
INTRAVENOUS | Status: DC | PRN
  Administered 2014-03-08: 12:00:00 via INTRAVENOUS

## 2014-03-08 MED ORDER — SODIUM CHLORIDE 0.9 % IR SOLN
Status: DC | PRN
  Administered 2014-03-08: 3000 mL

## 2014-03-08 MED ORDER — OXYCODONE-ACETAMINOPHEN 5-325 MG PO TABS: 1.0000 | ORAL_TABLET | ORAL | Status: DC | PRN

## 2014-03-08 MED ORDER — METHYLPREDNISOLONE ACETATE 40 MG/ML IJ SUSP
INTRAMUSCULAR | Status: DC | PRN
  Administered 2014-03-08: 40 mg via INTRA_ARTICULAR

## 2014-03-08 SURGICAL SUPPLY — 60 items
BANDAGE ELASTIC 6 VELCRO ST LF (GAUZE/BANDAGES/DRESSINGS) ×2 IMPLANT
BANDAGE ESMARK 6X9 LF (GAUZE/BANDAGES/DRESSINGS) IMPLANT
BLADE GREAT WHITE 4.2 (BLADE) ×2 IMPLANT
BLADE GREAT WHITE 4.2MM (BLADE) ×1
BLADE SURG 11 STRL SS (BLADE) IMPLANT
BLADE SURG ROTATE 9660 (MISCELLANEOUS) IMPLANT
BNDG CMPR 9X6 STRL LF SNTH (GAUZE/BANDAGES/DRESSINGS)
BNDG ESMARK 6X9 LF (GAUZE/BANDAGES/DRESSINGS)
BOOTCOVER CLEANROOM LRG (PROTECTIVE WEAR) ×6 IMPLANT
COVER SURGICAL LIGHT HANDLE (MISCELLANEOUS) ×3 IMPLANT
CUFF TOURNIQUET SINGLE 34IN LL (TOURNIQUET CUFF) IMPLANT
CUTTER MENISCUS 3.5MM 6/BX (BLADE) ×3 IMPLANT
DRAPE ARTHROSCOPY W/POUCH 114 (DRAPES) ×3 IMPLANT
DRAPE U-SHAPE 47X51 STRL (DRAPES) ×3 IMPLANT
DRSG PAD ABDOMINAL 8X10 ST (GAUZE/BANDAGES/DRESSINGS) ×2 IMPLANT
FACESHIELD WRAPAROUND (MASK) ×3 IMPLANT
FACESHIELD WRAPAROUND OR TEAM (MASK) ×1 IMPLANT
GAUZE SPONGE 4X4 12PLY STRL (GAUZE/BANDAGES/DRESSINGS) IMPLANT
GAUZE XEROFORM 1X8 LF (GAUZE/BANDAGES/DRESSINGS) ×2 IMPLANT
GLOVE BIO SURGEON STRL SZ 6.5 (GLOVE) ×2 IMPLANT
GLOVE BIO SURGEON STRL SZ8 (GLOVE) ×6 IMPLANT
GLOVE BIO SURGEONS STRL SZ 6.5 (GLOVE) ×1
GLOVE BIOGEL PI IND STRL 7.0 (GLOVE) ×1 IMPLANT
GLOVE BIOGEL PI IND STRL 8 (GLOVE) ×1 IMPLANT
GLOVE BIOGEL PI IND STRL 8.5 (GLOVE) ×1 IMPLANT
GLOVE BIOGEL PI INDICATOR 7.0 (GLOVE) ×2
GLOVE BIOGEL PI INDICATOR 8 (GLOVE) ×2
GLOVE BIOGEL PI INDICATOR 8.5 (GLOVE) ×2
GLOVE ORTHO TXT STRL SZ7.5 (GLOVE) ×6 IMPLANT
GOWN STRL REUS W/ TWL LRG LVL3 (GOWN DISPOSABLE) ×2 IMPLANT
GOWN STRL REUS W/ TWL XL LVL3 (GOWN DISPOSABLE) ×1 IMPLANT
GOWN STRL REUS W/TWL 2XL LVL3 (GOWN DISPOSABLE) ×3 IMPLANT
GOWN STRL REUS W/TWL LRG LVL3 (GOWN DISPOSABLE) ×6
GOWN STRL REUS W/TWL XL LVL3 (GOWN DISPOSABLE) ×3
KIT ROOM TURNOVER OR (KITS) ×3 IMPLANT
MANIFOLD NEPTUNE II (INSTRUMENTS) IMPLANT
NDL 18GX1X1/2 (RX/OR ONLY) (NEEDLE) IMPLANT
NDL SPNL 18GX3.5 QUINCKE PK (NEEDLE) IMPLANT
NEEDLE 18GX1X1/2 (RX/OR ONLY) (NEEDLE) IMPLANT
NEEDLE 22X1 1/2 (OR ONLY) (NEEDLE) ×3 IMPLANT
NEEDLE SPNL 18GX3.5 QUINCKE PK (NEEDLE) IMPLANT
NS IRRIG 1000ML POUR BTL (IV SOLUTION) IMPLANT
PACK ARTHROSCOPY DSU (CUSTOM PROCEDURE TRAY) ×3 IMPLANT
PAD ARMBOARD 7.5X6 YLW CONV (MISCELLANEOUS) ×6 IMPLANT
PAD CAST 4YDX4 CTTN HI CHSV (CAST SUPPLIES) IMPLANT
PADDING CAST COTTON 4X4 STRL (CAST SUPPLIES) ×3
PADDING CAST COTTON 6X4 STRL (CAST SUPPLIES) IMPLANT
SET ARTHROSCOPY TUBING (MISCELLANEOUS) ×3
SET ARTHROSCOPY TUBING LN (MISCELLANEOUS) ×1 IMPLANT
SPONGE GAUZE 4X4 12PLY STER LF (GAUZE/BANDAGES/DRESSINGS) ×2 IMPLANT
SPONGE LAP 4X18 X RAY DECT (DISPOSABLE) ×3 IMPLANT
SUT ETHILON 2 0 FS 18 (SUTURE) IMPLANT
SUT ETHILON 3 0 PS 1 (SUTURE) IMPLANT
SYR 20ML ECCENTRIC (SYRINGE) IMPLANT
SYR CONTROL 10ML LL (SYRINGE) IMPLANT
TOWEL OR 17X24 6PK STRL BLUE (TOWEL DISPOSABLE) ×3 IMPLANT
TOWEL OR 17X26 10 PK STRL BLUE (TOWEL DISPOSABLE) ×3 IMPLANT
TUBE CONNECTING 12'X1/4 (SUCTIONS) ×1
TUBE CONNECTING 12X1/4 (SUCTIONS) ×2 IMPLANT
WATER STERILE IRR 1000ML POUR (IV SOLUTION) ×3 IMPLANT

## 2014-03-08 NOTE — Anesthesia Procedure Notes (Signed)
Anesthesia Regional Block:  Knee block  Pre-Anesthetic Checklist: ,, timeout performed, Correct Patient, Correct Site, Correct Laterality, Correct Procedure, Correct Position, site marked, Risks and benefits discussed, Surgical consent,  Pre-op evaluation,  Post-op pain management  Laterality: Lower and Left  Prep: chloraprep       Needles:       Needle Gauge: 22 and 22 G    Additional Needles: Knee block Narrative:  Start time: 03/08/2014 11:10 AM End time: 03/08/2014 11:20 AM Injection made incrementally with aspirations every 5 mL.  Performed by: Personally  Anesthesiologist: Kasheena Sambrano  Additional Notes: Two inferior portals injected, 10 cc Lidocaine 1.5% each portal . Tolerated well

## 2014-03-08 NOTE — Progress Notes (Signed)
Dr Tamala Julian at bedside.  States pt may be discharged with sats as low as 80%.

## 2014-03-08 NOTE — Anesthesia Preprocedure Evaluation (Addendum)
Anesthesia Evaluation   Patient awake    Reviewed: Allergy & Precautions, H&P , NPO status , Patient's Chart, lab work & pertinent test results  Airway Mallampati: II   Neck ROM: Full  Mouth opening: Limited Mouth Opening  Dental  (+) Teeth Intact, Edentulous Upper   Pulmonary COPDCurrent Smoker,  breath sounds clear to auscultation  + wheezing      Cardiovascular hypertension, + Peripheral Vascular Disease + Valvular Problems/Murmurs AS Rate:Normal + Systolic murmurs Moderate AS, carotid artery disease,pvd   Neuro/Psych    GI/Hepatic Neg liver ROS, GERD-  ,  Endo/Other    Renal/GU negative Renal ROS     Musculoskeletal  (+) Arthritis -,   Abdominal   Peds  Hematology   Anesthesia Other Findings   Reproductive/Obstetrics                            Anesthesia Physical Anesthesia Plan  ASA: III  Anesthesia Plan: General   Post-op Pain Management:    Induction: Intravenous  Airway Management Planned: LMA  Additional Equipment:   Intra-op Plan:   Post-operative Plan: Extubation in OR  Informed Consent: I have reviewed the patients History and Physical, chart, labs and discussed the procedure including the risks, benefits and alternatives for the proposed anesthesia with the patient or authorized representative who has indicated his/her understanding and acceptance.   Dental advisory given  Plan Discussed with: CRNA and Surgeon  Anesthesia Plan Comments:         Anesthesia Quick Evaluation

## 2014-03-08 NOTE — Anesthesia Postprocedure Evaluation (Signed)
  Anesthesia Post-op Note  Patient: Ronald Bell  Procedure(s) Performed: Procedure(s): LEFT KNEE SCOPE WITH MEDIAL MENISECTOMY AND CHONDROPLASTY (Left)  Patient Location: PACU  Anesthesia Type:General  Level of Consciousness: awake, alert , oriented and patient cooperative  Airway and Oxygen Therapy: Patient Spontanous Breathing  Post-op Pain: mild  Post-op Assessment: Post-op Vital signs reviewed, Patient's Cardiovascular Status Stable, Respiratory Function Stable, Patent Airway, No signs of Nausea or vomiting and Pain level controlled  Post-op Vital Signs: stable  Last Vitals:  Filed Vitals:   03/08/14 1138  BP:   Pulse: 72  Temp:   Resp: 17    Complications: No apparent anesthesia complications

## 2014-03-08 NOTE — Discharge Instructions (Signed)
Arthroscopic Procedure, Knee, Care After Refer to this sheet in the next few weeks. These discharge instructions provide you with general information on caring for yourself after you leave the hospital. Your health care provider may also give you specific instructions. Your treatment has been planned according to the most current medical practices available, but unavoidable complications sometimes occur. If you have any problems or questions after discharge, please call your health care provider. HOME CARE INSTRUCTIONS   Weight bearing as tolerated.  Remove bandages and apply band-aids in 3 days.  May shower in 3 days, but do not soak incisions.  May apply ice for up to 20 minutes at a time for pain and swelling.  Follow up appointment in one week.   It is normal to be sore for a couple days after surgery. See your health care provider if this seems to be getting worse rather than better.  Only take over-the-counter or prescription medicines for pain, discomfort, or fever as directed by your health care provider.  Take showers rather than baths, or as directed by your health care provider.  Change bandages (dressings) if necessary or as directed.  You may resume normal diet and activities as directed or allowed.  Avoid lifting and driving until you are directed otherwise.  Make an appointment to see your health care provider for stitches (suture) or staple removal as directed.  You may put ice on the area.  Put the ice in a plastic bag. Place a towel between your skin and the bag.  Leave the ice on for 15-20 minutes, three to four times per day for the first 2 days.  Elevate the knee above the level of your heart to reduce swelling, and avoid dangling the leg.  Do 10-15 ankle pumps (pointing your toes toward you and then away from you) two to three times daily.  If you are given compression stockings to wear after surgery, use them for as long as your surgeon tells you (around 10-14  days).  Avoid smoking and exposure to secondhand smoke. SEEK MEDICAL CARE IF:   You have increased bleeding from your wounds.  You see redness or swelling or you have increasing pain in your wounds.  You have pus coming from your wound.  You have a fever or persistent symptoms for more than 2-3 days.  You notice a bad smell coming from the wound or dressing.  You have severe pain with any motion of your knee. SEEK IMMEDIATE MEDICAL CARE IF:   You develop a rash.  You have difficulty breathing.  You develop any reaction or side effects to medicines taken.  You develop pain in the calves or back of the knee.  You develop chest pain, shortness of breath, or difficulty breathing.  You develop numbness or tingling in the leg or foot. MAKE SURE YOU:   Understand these instructions.  Will watch your condition.  Will get help right away if you are not doing well or you get worse. Document Released: 09/26/2004 Document Revised: 03/14/2013 Document Reviewed: 08/04/2012 [facility] Patient Information 2015 [place], [place]. This information is not intended to replace advice given to you by your health care provider. Make sure you discuss any questions you have with your health care provider.   What to eat:  For your first meals, you should eat lightly; only small meals initially.  If you do not have nausea, you may eat larger meals.  Avoid spicy, greasy and heavy food.    General Anesthesia, Adult,  Care After  Refer to this sheet in the next few weeks. These instructions provide you with information on caring for yourself after your procedure. Your health care provider may also give you more specific instructions. Your treatment has been planned according to current medical practices, but problems sometimes occur. Call your health care provider if you have any problems or questions after your procedure.  WHAT TO EXPECT AFTER THE PROCEDURE  After the procedure, it is typical to  experience:  Sleepiness.  Nausea and vomiting. HOME CARE INSTRUCTIONS  For the first 24 hours after general anesthesia:  Have a responsible person with you.  Do not drive a car. If you are alone, do not take public transportation.  Do not drink alcohol.  Do not take medicine that has not been prescribed by your health care provider.  Do not sign important papers or make important decisions.  You may resume a normal diet and activities as directed by your health care provider.  Change bandages (dressings) as directed.  If you have questions or problems that seem related to general anesthesia, call the hospital and ask for the anesthetist or anesthesiologist on call. SEEK MEDICAL CARE IF:  You have nausea and vomiting that continue the day after anesthesia.  You develop a rash. SEEK IMMEDIATE MEDICAL CARE IF:  You have difficulty breathing.  You have chest pain.  You have any allergic problems. Document Released: 06/15/2000 Document Revised: 11/09/2012 Document Reviewed: 09/22/2012  [facility] Patient Information 2014 [place], [place].

## 2014-03-08 NOTE — Transfer of Care (Signed)
Immediate Anesthesia Transfer of Care Note  Patient: Ronald Bell  Procedure(s) Performed: Procedure(s): LEFT KNEE SCOPE WITH MEDIAL MENISECTOMY AND CHONDROPLASTY (Left)  Patient Location: PACU  Anesthesia Type:General  Level of Consciousness: awake, oriented and patient cooperative  Airway & Oxygen Therapy: Patient Spontanous Breathing and Patient connected to nasal cannula oxygen  Post-op Assessment: Report given to PACU RN, Post -op Vital signs reviewed and stable and Patient moving all extremities  Post vital signs: Reviewed and stable  Complications: No apparent anesthesia complications

## 2014-03-08 NOTE — Progress Notes (Signed)
Albuterlol tx given per Dr Tamala Julian order

## 2014-03-08 NOTE — Progress Notes (Signed)
o2 sats drop to low 80's on RA.  Will cont to monitor.

## 2014-03-08 NOTE — Interval H&P Note (Signed)
History and Physical Interval Note:  03/08/2014 7:20 AM  Ronald Bell  has presented today for surgery, with the diagnosis of [place] MENISCUS/CURRENT INJURY LEFT KNEE  The various methods of treatment have been discussed with the patient and family. After consideration of risks, benefits and other options for treatment, the patient has consented to  Procedure(s): LEFT KNEE SCOPE WITH MEDIAL MENISECTOMY (Left) as a surgical intervention .  The patient's history has been reviewed, patient examined, no change in status, stable for surgery.  I have reviewed the patient's chart and labs.  Questions were answered to the patient's satisfaction.     Mikka Kissner F

## 2014-03-09 ENCOUNTER — Encounter (HOSPITAL_COMMUNITY): Payer: Self-pay | Admitting: Orthopedic Surgery

## 2014-03-09 NOTE — Op Note (Signed)
NAME:  Ronald Bell, Ronald Bell NO.:  [id number]  MEDICAL RECORD NO.:  [id number]  LOCATION:  [facility]                         FACILITY:  [facility]  PHYSICIAN:  Ninetta Lights, M.D. DATE OF BIRTH:  08/02/1940  DATE OF PROCEDURE:  03/08/2014 DATE OF DISCHARGE:  03/08/2014                              OPERATIVE REPORT   PREOPERATIVE DIAGNOSIS:  Left knee medial meniscus tear.  POSTOPERATIVE DIAGNOSIS:  Left knee medial meniscus tear with some focal grade 2 and 3 changes medial femoral condyle.  PROCEDURE:  Left knee exam under anesthesia, arthroscopy.  Partial medial meniscectomy.  Chondroplasty medial femoral condyle.  SURGEON:  Ninetta Lights, MD  ASSISTANT:  Doran Stabler, PA  ANESTHESIA:  General.  BLOOD LOSS:  Minimal.  SPECIMENS:  None.  CULTURES:  None.  COMPLICATIONS:  None.  DRESSINGS:  Soft compressive.  TOURNIQUET:  Not employed.  DESCRIPTION OF PROCEDURE:  The patient was brought to the operating room, placed on the operating table in supine position.  After adequate anesthesia had been obtained, leg holder applied.  Leg prepped and draped in usual sterile fashion.  Two portals, one each medial and lateral parapatellar.  Arthroscope introduced, knee distended and inspected.  Good patellar tracking.  Fair amount of reactive synovitis debrided.  For the most part articular cartilage looked great throughout the knee with just mild diffuse changes.  One focal area grade 3 medial femoral condyle Juxtaposed to the displaced meniscal tear.  Extensive tearing posterior half medial meniscus.  Taken all the way after rim tapered in main meniscus.  Debrided the condyle.  Entire knee examined and no other significant findings were appreciated.  Instruments were fully removed.  Portals were closed with nylon.  Knee injected with Depo-Medrol and Marcaine.  Anesthesia reversed after sterile compressive dressing applied.  Tolerated the surgery well.  No  complications.     Ninetta Lights, M.D.     DFM/MEDQ  D:  03/08/2014  T:  03/09/2014  Job:  [id number]

## 2014-03-13 ENCOUNTER — Other Ambulatory Visit: Payer: Self-pay

## 2014-03-13 MED ORDER — FENOFIBRATE 160 MG PO TABS
160.0000 mg | ORAL_TABLET | Freq: Every day | ORAL | Status: DC
Start: 2014-03-13 — End: 2014-06-21

## 2014-03-27 ENCOUNTER — Encounter: Payer: Medicare Other | Admitting: Family Medicine

## 2014-04-06 ENCOUNTER — Ambulatory Visit (HOSPITAL_COMMUNITY): Payer: Medicare Other | Attending: Internal Medicine

## 2014-04-06 DIAGNOSIS — R011 Cardiac murmur, unspecified: Secondary | ICD-10-CM | POA: Insufficient documentation

## 2014-04-06 NOTE — Progress Notes (Signed)
2D Echo completed. 04/06/2014

## 2014-04-16 ENCOUNTER — Encounter (HOSPITAL_COMMUNITY): Payer: Self-pay | Admitting: *Deleted

## 2014-04-16 ENCOUNTER — Emergency Department (HOSPITAL_COMMUNITY): Payer: Medicare Other

## 2014-04-16 ENCOUNTER — Inpatient Hospital Stay (HOSPITAL_COMMUNITY)
Admission: EM | Admit: 2014-04-16 | Discharge: 2014-04-17 | DRG: 190 | Disposition: A | Discharge status: Home / Self Care | Payer: Medicare Other | Attending: Internal Medicine | Admitting: Internal Medicine

## 2014-04-16 DIAGNOSIS — E785 Hyperlipidemia, unspecified: Secondary | ICD-10-CM | POA: Diagnosis present

## 2014-04-16 DIAGNOSIS — K219 Gastro-esophageal reflux disease without esophagitis: Secondary | ICD-10-CM | POA: Diagnosis present

## 2014-04-16 DIAGNOSIS — J449 Chronic obstructive pulmonary disease, unspecified: Secondary | ICD-10-CM | POA: Diagnosis present

## 2014-04-16 DIAGNOSIS — Z8249 Family history of ischemic heart disease and other diseases of the circulatory system: Secondary | ICD-10-CM | POA: Diagnosis not present

## 2014-04-16 DIAGNOSIS — I10 Essential (primary) hypertension: Secondary | ICD-10-CM | POA: Diagnosis present

## 2014-04-16 DIAGNOSIS — R0602 Shortness of breath: Secondary | ICD-10-CM | POA: Diagnosis not present

## 2014-04-16 DIAGNOSIS — F17210 Nicotine dependence, cigarettes, uncomplicated: Secondary | ICD-10-CM | POA: Diagnosis present

## 2014-04-16 DIAGNOSIS — J441 Chronic obstructive pulmonary disease with (acute) exacerbation: Principal | ICD-10-CM | POA: Diagnosis present

## 2014-04-16 DIAGNOSIS — J9601 Acute respiratory failure with hypoxia: Secondary | ICD-10-CM | POA: Diagnosis present

## 2014-04-16 DIAGNOSIS — J929 Pleural plaque without asbestos: Secondary | ICD-10-CM | POA: Diagnosis not present

## 2014-04-16 DIAGNOSIS — Z801 Family history of malignant neoplasm of trachea, bronchus and lung: Secondary | ICD-10-CM | POA: Diagnosis not present

## 2014-04-16 DIAGNOSIS — Z7982 Long term (current) use of aspirin: Secondary | ICD-10-CM

## 2014-04-16 DIAGNOSIS — J439 Emphysema, unspecified: Secondary | ICD-10-CM | POA: Diagnosis not present

## 2014-04-16 LAB — CBC WITH DIFFERENTIAL/PLATELET
BASOS PCT: 1 % (ref 0–1)
Basophils Absolute: 0.2 10*3/uL — ABNORMAL HIGH (ref 0.0–0.1)
EOS PCT: 4 % (ref 0–5)
Eosinophils Absolute: 0.8 10*3/uL — ABNORMAL HIGH (ref 0.0–0.7)
HEMATOCRIT: 39.5 % (ref 39.0–52.0)
Hemoglobin: 13.3 g/dL (ref 13.0–17.0)
LYMPHS ABS: 3.4 10*3/uL (ref 0.7–4.0)
Lymphocytes Relative: 18 % (ref 12–46)
MCH: 33.7 pg (ref 26.0–34.0)
MCHC: 33.7 g/dL (ref 30.0–36.0)
MCV: 100 fL (ref 78.0–100.0)
MONO ABS: 1.5 10*3/uL — AB (ref 0.1–1.0)
MONOS PCT: 8 % (ref 3–12)
Neutro Abs: 13.2 10*3/uL — ABNORMAL HIGH (ref 1.7–7.7)
Neutrophils Relative %: 69 % (ref 43–77)
PLATELETS: 350 10*3/uL (ref 150–400)
RBC: 3.95 MIL/uL — ABNORMAL LOW (ref 4.22–5.81)
RDW: 13.2 % (ref 11.5–15.5)
WBC: 19.1 10*3/uL — AB (ref 4.0–10.5)

## 2014-04-16 LAB — BASIC METABOLIC PANEL
Anion gap: 7 (ref 5–15)
BUN: 18 mg/dL (ref 6–23)
CO2: 26 mmol/L (ref 19–32)
CREATININE: 0.95 mg/dL (ref 0.50–1.35)
Calcium: 9.4 mg/dL (ref 8.4–10.5)
Chloride: 100 mmol/L (ref 96–112)
GFR calc Af Amer: >90 mL/min (ref >90)
GFR calc non Af Amer: 79 mL/min — ABNORMAL LOW (ref >90)
GLUCOSE: 210 mg/dL — AB (ref 70–99)
Potassium: 4.7 mmol/L (ref 3.5–5.1)
Sodium: 133 mmol/L — ABNORMAL LOW (ref 135–145)

## 2014-04-16 LAB — TROPONIN I: Troponin I: <0.03 ng/mL (ref <0.031)

## 2014-04-16 LAB — GLUCOSE, CAPILLARY: GLUCOSE-CAPILLARY: 135 mg/dL — AB (ref 70–99)

## 2014-04-16 LAB — MRSA PCR SCREENING: MRSA by PCR: NEGATIVE

## 2014-04-16 MED ORDER — ALUM & MAG HYDROXIDE-SIMETH 200-200-20 MG/5ML PO SUSP
30.0000 mL | Freq: Four times a day (QID) | ORAL | Status: DC | PRN
  Filled 2014-04-16: qty 30

## 2014-04-16 MED ORDER — IPRATROPIUM-ALBUTEROL 0.5-2.5 (3) MG/3ML IN SOLN
3.0000 mL | Freq: Three times a day (TID) | RESPIRATORY_TRACT | Status: DC
  Administered 2014-04-17: 3 mL via RESPIRATORY_TRACT
  Filled 2014-04-16: qty 3

## 2014-04-16 MED ORDER — VALSARTAN-HYDROCHLOROTHIAZIDE 160-12.5 MG PO TABS: 1.0000 | ORAL_TABLET | Freq: Every day | ORAL | Status: DC

## 2014-04-16 MED ORDER — DOCUSATE SODIUM 100 MG PO CAPS
100.0000 mg | ORAL_CAPSULE | Freq: Two times a day (BID) | ORAL | Status: DC
  Administered 2014-04-16 – 2014-04-17 (×3): 100 mg via ORAL
  Filled 2014-04-16 (×5): qty 1

## 2014-04-16 MED ORDER — PANTOPRAZOLE SODIUM 40 MG PO TBEC
40.0000 mg | DELAYED_RELEASE_TABLET | Freq: Every day | ORAL | Status: DC
  Administered 2014-04-16 – 2014-04-17 (×2): 40 mg via ORAL
  Filled 2014-04-16 (×2): qty 1

## 2014-04-16 MED ORDER — ACETAMINOPHEN 325 MG PO TABS: 650.0000 mg | ORAL_TABLET | Freq: Four times a day (QID) | ORAL | Status: DC | PRN

## 2014-04-16 MED ORDER — OXYCODONE HCL 5 MG PO TABS: 5.0000 mg | ORAL_TABLET | ORAL | Status: DC | PRN

## 2014-04-16 MED ORDER — ACETAMINOPHEN 650 MG RE SUPP: 650.0000 mg | Freq: Four times a day (QID) | RECTAL | Status: DC | PRN

## 2014-04-16 MED ORDER — ALBUTEROL (5 MG/ML) CONTINUOUS INHALATION SOLN
10.0000 mg/h | INHALATION_SOLUTION | RESPIRATORY_TRACT | Status: DC
Start: 2014-04-16 — End: 2014-04-17
  Administered 2014-04-16: 10 mg/h via RESPIRATORY_TRACT

## 2014-04-16 MED ORDER — ENOXAPARIN SODIUM 30 MG/0.3ML ~~LOC~~ SOLN
30.0000 mg | SUBCUTANEOUS | Status: DC
  Filled 2014-04-16: qty 0.3

## 2014-04-16 MED ORDER — ATORVASTATIN CALCIUM 40 MG PO TABS
40.0000 mg | ORAL_TABLET | Freq: Every day | ORAL | Status: DC
  Administered 2014-04-16: 40 mg via ORAL
  Filled 2014-04-16 (×2): qty 1

## 2014-04-16 MED ORDER — ZOLPIDEM TARTRATE 5 MG PO TABS
5.0000 mg | ORAL_TABLET | Freq: Once | ORAL | Status: AC
  Administered 2014-04-16: 5 mg via ORAL
  Filled 2014-04-16: qty 1

## 2014-04-16 MED ORDER — IPRATROPIUM-ALBUTEROL 0.5-2.5 (3) MG/3ML IN SOLN
3.0000 mL | RESPIRATORY_TRACT | Status: DC
  Administered 2014-04-16 (×3): 3 mL via RESPIRATORY_TRACT
  Filled 2014-04-16 (×4): qty 3

## 2014-04-16 MED ORDER — ONDANSETRON HCL 4 MG/2ML IJ SOLN: 4.0000 mg | Freq: Four times a day (QID) | INTRAMUSCULAR | Status: DC | PRN

## 2014-04-16 MED ORDER — FENOFIBRATE 160 MG PO TABS
160.0000 mg | ORAL_TABLET | Freq: Every day | ORAL | Status: DC
  Administered 2014-04-16 – 2014-04-17 (×2): 160 mg via ORAL
  Filled 2014-04-16 (×2): qty 1

## 2014-04-16 MED ORDER — IRBESARTAN 150 MG PO TABS
150.0000 mg | ORAL_TABLET | Freq: Every day | ORAL | Status: DC
  Administered 2014-04-16 – 2014-04-17 (×2): 150 mg via ORAL
  Filled 2014-04-16 (×2): qty 1

## 2014-04-16 MED ORDER — HYDROMORPHONE HCL 1 MG/ML IJ SOLN: 0.5000 mg | INTRAMUSCULAR | Status: DC | PRN

## 2014-04-16 MED ORDER — ONDANSETRON HCL 4 MG PO TABS: 4.0000 mg | ORAL_TABLET | Freq: Four times a day (QID) | ORAL | Status: DC | PRN

## 2014-04-16 MED ORDER — ASPIRIN 81 MG PO TBEC
81.0000 mg | DELAYED_RELEASE_TABLET | Freq: Every day | ORAL | Status: DC
Start: 2014-04-16 — End: 2014-04-17
  Administered 2014-04-16 – 2014-04-17 (×2): 81 mg via ORAL
  Filled 2014-04-16 (×2): qty 1

## 2014-04-16 MED ORDER — HYDROCHLOROTHIAZIDE 12.5 MG PO CAPS
12.5000 mg | ORAL_CAPSULE | Freq: Every day | ORAL | Status: DC
  Administered 2014-04-16 – 2014-04-17 (×2): 12.5 mg via ORAL
  Filled 2014-04-16 (×2): qty 1

## 2014-04-16 MED ORDER — ALBUTEROL (5 MG/ML) CONTINUOUS INHALATION SOLN
INHALATION_SOLUTION | RESPIRATORY_TRACT | Status: AC
  Administered 2014-04-16: 10 mg/h via RESPIRATORY_TRACT
  Filled 2014-04-16: qty 20

## 2014-04-16 MED ORDER — ENOXAPARIN SODIUM 40 MG/0.4ML ~~LOC~~ SOLN
40.0000 mg | SUBCUTANEOUS | Status: DC
  Administered 2014-04-16: 40 mg via SUBCUTANEOUS
  Filled 2014-04-16 (×2): qty 0.4

## 2014-04-16 MED ORDER — FLUTICASONE FUROATE-VILANTEROL 100-25 MCG/INH IN AEPB: 1.0000 | INHALATION_SPRAY | Freq: Every day | RESPIRATORY_TRACT | Status: DC

## 2014-04-16 MED ORDER — CETYLPYRIDINIUM CHLORIDE 0.05 % MT LIQD
7.0000 mL | Freq: Two times a day (BID) | OROMUCOSAL | Status: DC
  Administered 2014-04-16 – 2014-04-17 (×3): 7 mL via OROMUCOSAL

## 2014-04-16 MED ORDER — AZITHROMYCIN 500 MG PO TABS
500.0000 mg | ORAL_TABLET | Freq: Every day | ORAL | Status: DC
  Administered 2014-04-16 – 2014-04-17 (×2): 500 mg via ORAL
  Filled 2014-04-16 (×2): qty 1

## 2014-04-16 MED ORDER — PREDNISONE 20 MG PO TABS
40.0000 mg | ORAL_TABLET | Freq: Every day | ORAL | Status: DC
  Administered 2014-04-16 – 2014-04-17 (×2): 40 mg via ORAL
  Filled 2014-04-16 (×3): qty 2

## 2014-04-16 NOTE — ED Notes (Signed)
Attempted to give report to admitting unit.

## 2014-04-16 NOTE — ED Provider Notes (Signed)
CSN: [id number]     Arrival date & time 04/16/14  0344 History  This chart was scribed for Ronald Drape, MD by Chester Holstein, ED Scribe. This patient was seen in room A09C/A09C and the patient's care was started at 3:53 AM.     Chief Complaint  Patient presents with  . Shortness of Breath     Patient is a 74 y.o. male presenting with shortness of breath. The history is provided by the patient and the EMS personnel. No language interpreter was used.  Shortness of Breath Associated symptoms: no chest pain    HPI Comments: Ronald Bell is a 74 y.o. male brought in by ambulance, with PMHx of COPD, PVD, irregular heartbeat,, who presents to the Emergency Department complaining of SOB with onset at 8 PM. EMS notes wheezing in right lobe. En route to ED he received 10mg  of albuterol, .5 of atrovent, and 125 of solumedrol. EMS states pt felt better after first albuterol treatment.  Pt denies Lasix use and PSH of intubation. Pt denies chest pain.  No fevers, slight increased cough.  Patient reports otherwise he feels better with the hired with breathing.  Patient noted to be tripod position on the bed and using maximal accessory muscles when trying to breathe.   Past Medical History  Diagnosis Date  . Other symptoms involving cardiovascular system   . Polyp of nasal cavity   . Other specified disorders of arteries and arterioles   . Cervicalgia   . Routine general medical examination at a health care facility   . Rosacea   . Benign neoplasm of colon   . Personal history of tobacco use, presenting hazards to health   . Personal history of other diseases of digestive system   . Psychosexual dysfunction with inhibited sexual excitement   . Chronic airway obstruction, not elsewhere classified   . Unspecified essential hypertension   . Other and unspecified hyperlipidemia   . GERD (gastroesophageal reflux disease)   . Anemia   . Arthritis   . Blood transfusion 2010    4 units  .  Adenomatous colon polyp   . Peripheral vascular disease   . Irregular heartbeat   . Shingles   . Carotid artery occlusion   . Angiodysplasia of intestine with hemorrhage    Past Surgical History  Procedure Laterality Date  . Colon surgery    . Iliac artery stent Left 2005    CIA  . Esophagogastroduodenoscopy  2012    normal  . Colonoscopy  July 2015    Dr. Henrene Pastor  . Tonsillectomy    . Cardiac catheterization  2001  . Knee arthroscopy with medial menisectomy Left 03/08/2014    Procedure: LEFT KNEE SCOPE WITH MEDIAL MENISECTOMY AND CHONDROPLASTY;  Surgeon: Ninetta Lights, MD;  Location: [facility];  Service: Orthopedics;  Laterality: Left;   Family History  Problem Relation Age of Onset  . Lung disease Mother     pulm fibrosis  . Uterine cancer    . Colitis Father   . Colon cancer Neg Hx   . Heart disease Brother   . Hypertension Brother   . Hyperlipidemia Brother   . Heart disease Daughter     cad  . Hypertension Son    History  Substance Use Topics  . Smoking status: Current Every Day Smoker -- 0.75 packs/day for 55 years    Types: Cigarettes, E-cigarettes  . Smokeless tobacco: Never Used     Comment: pt states he knows  he needs to quit. Also uses e-cig.  Marland Kitchen Alcohol Use: 2.4 oz/week    4 Glasses of wine per week     Comment: occasional    Review of Systems  Respiratory: Positive for shortness of breath.   Cardiovascular: Negative for chest pain.  All other systems reviewed and are negative.     Allergies  Review of patient's allergies indicates no known allergies.  Home Medications   Prior to Admission medications   Medication Sig Start Date End Date Taking? Authorizing Provider  amLODipine (NORVASC) 10 MG tablet TAKE 1 TABLET (10 MG TOTAL) BY MOUTH DAILY. 08/21/13   Rosalita Chessman, DO  Ascorbic Acid (VITAMIN C) 250 MG tablet Take 250 mg by mouth daily.     Historical Provider, MD  aspirin 81 MG EC tablet Take 81 mg by mouth daily.      Historical Provider, MD   atorvastatin (LIPITOR) 40 MG tablet Take 1 tablet (40 mg total) by mouth daily. 08/21/13   Rosalita Chessman, DO  DOCUSATE SODIUM PO Take 1 tablet by mouth 2 (two) times daily.     Historical Provider, MD  fenofibrate 160 MG tablet Take 1 tablet (160 mg total) by mouth daily. 03/13/14   Rosalita Chessman, DO  Ferrous Sulfate (IRON) 325 (65 FE) MG TABS Take 1 tablet by mouth 2 (two) times daily. 07/25/10   Alferd Apa Lowne, DO  Fluticasone Furoate-Vilanterol (BREO ELLIPTA) 100-25 MCG/INH AEPB Inhale 1 puff into the lungs daily. 12/01/13   Tanda Rockers, MD  Ipratropium-Albuterol (COMBIVENT RESPIMAT) 20-100 MCG/ACT AERS respimat Inhale 1 puff into the lungs 4 (four) times daily as needed for wheezing. 08/21/13   Rosalita Chessman, DO  Multiple Vitamins-Minerals (CENTRUM SILVER PO) Take 1 tablet by mouth daily.     Historical Provider, MD  oxyCODONE-acetaminophen (PERCOCET/ROXICET) 5-325 MG per tablet Take 2 tablets by mouth 2 (two) times daily.    Historical Provider, MD  pantoprazole (PROTONIX) 40 MG tablet TAKE ONE TABLET BY MOUTH ONCE DAILY 08/21/13   Rosalita Chessman, DO  valsartan-hydrochlorothiazide (DIOVAN HCT) 160-12.5 MG per tablet Take 1 tablet by mouth daily. 08/21/13   Alferd Apa Lowne, DO   SpO2 97% Physical Exam  Constitutional: He is oriented to person, place, and time. He appears well-developed and well-nourished. He appears distressed.  HENT:  Head: Normocephalic and atraumatic.  Nose: Nose normal.  Mouth/Throat: Oropharynx is clear and moist.  Eyes: Conjunctivae and EOM are normal. Pupils are equal, round, and reactive to light.  Neck: Normal range of motion. Neck supple. No JVD present. No tracheal deviation present. No thyromegaly present.  Cardiovascular: Normal rate, regular rhythm, normal heart sounds and intact distal pulses.  Exam reveals no gallop and no friction rub.   No murmur heard. Pulmonary/Chest: Breath sounds normal. No stridor. He is in respiratory distress. He has no wheezes. He has  no rales. He exhibits no tenderness.  Abdominal: Soft. Bowel sounds are normal. He exhibits no distension and no mass. There is no tenderness. There is no rebound and no guarding.  Musculoskeletal: Normal range of motion. He exhibits no edema or tenderness.  Lymphadenopathy:    He has no cervical adenopathy.  Neurological: He is alert and oriented to person, place, and time. He displays normal reflexes. He exhibits normal muscle tone. Coordination normal.  Skin: Skin is warm and dry. No rash noted. No erythema. No pallor.  Psychiatric: He has a normal mood and affect. His behavior is normal. Judgment and  thought content normal.  Nursing note and vitals reviewed.   ED Course  Procedures (including critical care time) DIAGNOSTIC STUDIES: Oxygen Saturation is 97% on oxygen, normal by my interpretation.    COORDINATION OF CARE: 3:57 AM Discussed treatment plan with patient at beside, the patient agrees with the plan and has no further questions at this time.   Labs Review Labs Reviewed  BASIC METABOLIC PANEL - Abnormal; Notable for the following:    Sodium 133 (*)    Glucose, Bld 210 (*)    GFR calc non Af Amer 79 (*)    All other components within normal limits  CBC WITH DIFFERENTIAL/PLATELET - Abnormal; Notable for the following:    WBC 19.1 (*)    RBC 3.95 (*)    Neutro Abs 13.2 (*)    Monocytes Absolute 1.5 (*)    Eosinophils Absolute 0.8 (*)    Basophils Absolute 0.2 (*)    All other components within normal limits  TROPONIN I    Imaging Review Dg Chest Port 1 View  04/16/2014   CLINICAL DATA:  Shortness of breath.  History of emphysema.  EXAM: PORTABLE CHEST - 1 VIEW  COMPARISON:  02/09/2014  FINDINGS: Normal heart size and pulmonary vascularity. Emphysematous changes and scattered fibrosis in the lungs. Bilateral calcified pleural plaques. This suggest possible asbestos exposure. No focal consolidation in the lungs. No blunting of costophrenic angles. No pneumothorax.  Calcified and tortuous aorta.  IMPRESSION: Emphysematous changes in the lungs. Calcified pleural plaques. No evidence of active disease.   Electronically Signed   By: Lucienne Capers M.D.   On: 04/16/2014 05:05     EKG Interpretation None     CRITICAL CARE Performed by: Ronald Bell Total critical care time: 60 min Critical care time was exclusive of separately billable procedures and treating other patients. Critical care was necessary to treat or prevent imminent or life-threatening deterioration. Critical care was time spent personally by me on the following activities: development of treatment plan with patient and/or surrogate as well as nursing, discussions with consultants, evaluation of patient's response to treatment, examination of patient, obtaining history from patient or surrogate, ordering and performing treatments and interventions, ordering and review of laboratory studies, ordering and review of radiographic studies, pulse oximetry and re-evaluation of patient's condition.  MDM   Final diagnoses:  COPD exacerbation    I personally performed the services described in this documentation, which was scribed in my presence. The recorded information has been reviewed and is accurate.  74 year old male, history of COPD with acute onset of COPD exacerbation.  Patient with significant respiratory distress started on BiPAP with improvement.  Patient to receive hour-long neb.  Labs and x-ray pending, patient will need admission to the hospital.    Ronald Drape, MD 04/16/14 [phone]

## 2014-04-16 NOTE — Progress Notes (Signed)
UR Completed.  [phone]  

## 2014-04-16 NOTE — H&P (Signed)
[facility] Admission History and Physical       Ronald Bell WVP:[id number] DOB: 18-Jun-1940 DOA: 04/16/2014  Referring physician: EDP PCP: [name], DO  Specialists:   Chief Complaint: SOB   HPI: Ronald Bell is a 73 y.o. male with a history of COPD , and HTN who presents to the ED with complaints of increased SOB and chest Tightness and Wheezing since 8 pm.  He denies chest pain, fevers or chills.   He denies cough.  He was hypoxic in the ED and placed on BiPAP.        Review of Systems:  Constitutional: No Weight Loss, No Weight Gain, Night Sweats, Fevers, Chills, Dizziness, Fatigue, or Generalized Weakness HEENT: No Headaches, Difficulty Swallowing,Tooth/Dental Problems,Sore Throat,  No Sneezing, Rhinitis, Ear Ache, Nasal Congestion, or Post Nasal Drip,  Cardio-vascular:  No Chest pain, Orthopnea, PND, Edema in Lower Extremities, Anasarca, Dizziness, Palpitations  Resp: +Dyspnea, No DOE, No Productive Cough, +Non-Productive Cough, No Hemoptysis, +Wheezing.    GI: No Heartburn, Indigestion, Abdominal Pain, Nausea, Vomiting, Diarrhea, Hematemesis, Hematochezia, Melena, Change in Bowel Habits,  Loss of Appetite  GU: No Dysuria, Change in Color of Urine, No Urgency or Frequency, No Flank pain.  Musculoskeletal: No Joint Pain or Swelling, No Decreased Range of Motion, No Back Pain.  Neurologic: No Syncope, No Seizures, Muscle Weakness, Paresthesia, Vision Disturbance or Loss, No Diplopia, No Vertigo, No Difficulty Walking,  Skin: No Rash or Lesions. Psych: No Change in Mood or Affect, No Depression or Anxiety, No Memory loss, No Confusion, or Hallucinations   Past Medical History  Diagnosis Date  . Other symptoms involving cardiovascular system   . Polyp of nasal cavity   . Other specified disorders of arteries and arterioles   . Cervicalgia   . Routine general medical examination at a health care facility   . Rosacea   . Benign neoplasm of colon   .  Personal history of tobacco use, presenting hazards to health   . Personal history of other diseases of digestive system   . Psychosexual dysfunction with inhibited sexual excitement   . Chronic airway obstruction, not elsewhere classified   . Unspecified essential hypertension   . Other and unspecified hyperlipidemia   . GERD (gastroesophageal reflux disease)   . Anemia   . Arthritis   . Blood transfusion 2010    4 units  . Adenomatous colon polyp   . Peripheral vascular disease   . Irregular heartbeat   . Shingles   . Carotid artery occlusion   . Angiodysplasia of intestine with hemorrhage       Past Surgical History  Procedure Laterality Date  . Colon surgery    . Iliac artery stent Left 2005    CIA  . Esophagogastroduodenoscopy  2012    normal  . Colonoscopy  July 2015    Dr. Henrene Pastor  . Tonsillectomy    . Cardiac catheterization  2001  . Knee arthroscopy with medial menisectomy Left 03/08/2014    Procedure: LEFT KNEE SCOPE WITH MEDIAL MENISECTOMY AND CHONDROPLASTY;  Surgeon: Ninetta Lights, MD;  Location: [facility];  Service: Orthopedics;  Laterality: Left;       Prior to Admission medications   Medication Sig Start Date End Date Taking? Authorizing Provider  amLODipine (NORVASC) 10 MG tablet TAKE 1 TABLET (10 MG TOTAL) BY MOUTH DAILY. 08/21/13  Yes Rosalita Chessman, DO  Ascorbic Acid (VITAMIN C) 250 MG tablet Take 500 mg by mouth daily.  Yes Historical Provider, MD  aspirin 81 MG EC tablet Take 81 mg by mouth daily.     Yes Historical Provider, MD  atorvastatin (LIPITOR) 40 MG tablet Take 1 tablet (40 mg total) by mouth daily. 08/21/13  Yes Yvonne R Lowne, DO  DOCUSATE SODIUM PO Take 1 tablet by mouth 2 (two) times daily.    Yes Historical Provider, MD  fenofibrate 160 MG tablet Take 1 tablet (160 mg total) by mouth daily. 03/13/14  Yes Rosalita Chessman, DO  Ferrous Sulfate (IRON) 325 (65 FE) MG TABS Take 1 tablet by mouth 2 (two) times daily. 07/25/10  Yes Yvonne R Lowne, DO    Fluticasone Furoate-Vilanterol (BREO ELLIPTA) 100-25 MCG/INH AEPB Inhale 1 puff into the lungs daily. 12/01/13  Yes Tanda Rockers, MD  Ipratropium-Albuterol (COMBIVENT RESPIMAT) 20-100 MCG/ACT AERS respimat Inhale 1 puff into the lungs 4 (four) times daily as needed for wheezing. 08/21/13  Yes Rosalita Chessman, DO  Multiple Vitamins-Minerals (CENTRUM SILVER PO) Take 1 tablet by mouth daily.    Yes Historical Provider, MD  pantoprazole (PROTONIX) 40 MG tablet TAKE ONE TABLET BY MOUTH ONCE DAILY 08/21/13  Yes Yvonne R Lowne, DO  valsartan-hydrochlorothiazide (DIOVAN HCT) 160-12.5 MG per tablet Take 1 tablet by mouth daily. 08/21/13  Yes Rosalita Chessman, DO  oxyCODONE-acetaminophen (PERCOCET/ROXICET) 5-325 MG per tablet Take 2 tablets by mouth 2 (two) times daily.    Historical Provider, MD      No Known Allergies   Social History:  reports that he has been smoking Cigarettes and E-cigarettes.  He has a 41.25 pack-year smoking history. He has never used smokeless tobacco. He reports that he drinks about 2.4 oz of alcohol per week. He reports that he does not use illicit drugs.     Family History  Problem Relation Age of Onset  . Lung disease Mother     pulm fibrosis  . Uterine cancer    . Colitis Father   . Colon cancer Neg Hx   . Heart disease Brother   . Hypertension Brother   . Hyperlipidemia Brother   . Heart disease Daughter     cad  . Hypertension Son        Physical Exam:  GEN:  Pleasant Elderly Obese  74 y.o. Caucasian male examined  and in no acute distress; cooperative with exam Filed Vitals:   04/16/14 0408 04/16/14 0426 04/16/14 0430 04/16/14 0500  BP: 99/47  105/43 103/39  Pulse: 103  107   Temp:      TempSrc:      Resp: 32  20 23  Height:      Weight:      SpO2: 100% 100% 100%    Blood pressure 103/39, pulse 107, temperature 98.1 F (36.7 C), temperature source Oral, resp. rate 23, height 5\' 3"  (1.6 m), weight 60.782 kg (134 lb), SpO2 100 %. PSYCH: He is alert and  oriented x4; does not appear anxious does not appear depressed; affect is normal HEENT: Normocephalic and Atraumatic, Mucous membranes pink; PERRLA; EOM intact; Fundi:  Benign;  No scleral icterus, Nares: Patent, Oropharynx: Clear, Fair Dentition,    Neck:  FROM, No Cervical Lymphadenopathy nor Thyromegaly or Carotid Bruit; No JVD; Breasts:: Not examined CHEST WALL: No tenderness CHEST: Decreased Breath Sounds , No Wheezing, No Rales, No Rhonchi,   HEART: Regular rate and rhythm; no murmurs rubs or gallops BACK: No kyphosis or scoliosis; No CVA tenderness ABDOMEN: Positive Bowel Sounds, Obese, Soft Non-Tender; No  Masses, No Organomegaly Rectal Exam: Not done EXTREMITIES: No Cyanosis, Clubbing, or Edema; No Ulcerations. Genitalia: not examined PULSES: 2+ and symmetric SKIN: Normal hydration no rash or ulceration CNS:  Alert and Oriented x 4, No Focal Deficits  Vascular: pulses palpable throughout    Labs on Admission:  Basic Metabolic Panel:  Recent Labs Lab 04/16/14 0430  NA 133*  K 4.7  CL 100  CO2 26  GLUCOSE 210*  BUN 18  CREATININE 0.95  CALCIUM 9.4   Liver Function Tests: No results for input(s): AST, ALT, ALKPHOS, BILITOT, PROT, ALBUMIN in the last 168 hours. No results for input(s): LIPASE, AMYLASE in the last 168 hours. No results for input(s): AMMONIA in the last 168 hours. CBC:  Recent Labs Lab 04/16/14 0430  WBC 19.1*  NEUTROABS 13.2*  HGB 13.3  HCT 39.5  MCV 100.0  PLT 350   Cardiac Enzymes:  Recent Labs Lab 04/16/14 0430  TROPONINI <0.03    BNP (last 3 results) No results for input(s): PROBNP in the last 8760 hours. CBG: No results for input(s): GLUCAP in the last 168 hours.  Radiological Exams on Admission: Dg Chest Port 1 View  04/16/2014   CLINICAL DATA:  Shortness of breath.  History of emphysema.  EXAM: PORTABLE CHEST - 1 VIEW  COMPARISON:  02/09/2014  FINDINGS: Normal heart size and pulmonary vascularity. Emphysematous changes and  scattered fibrosis in the lungs. Bilateral calcified pleural plaques. This suggest possible asbestos exposure. No focal consolidation in the lungs. No blunting of costophrenic angles. No pneumothorax. Calcified and tortuous aorta.  IMPRESSION: Emphysematous changes in the lungs. Calcified pleural plaques. No evidence of active disease.   Electronically Signed   By: Lucienne Capers M.D.   On: 04/16/2014 05:05     EKG: Independently reviewed.    Assessment/Plan:   73 y.o. male with  Principal Problem:   1.   COPD exacerbation/COPD GOLD III with min reversibilty          Active Problems:   2.    Acute Respiratory Failure    On BiPAP   Titrate PRN     2.   Essential hypertension   PRN IV Hydralazine      3.   Hyperlipidemia   Continue Atorvastatin       4.   DVT Prophylaxis   Lovenox    Code Status: FULL CODE   Family Communication:    No Family Present Disposition Plan:     Inpatient    Time spent: 16 [place] [address] [facility] Pager [phone]   If [place] Please Contact the Day Rounding Team MD for [facility]  If 7PM-7AM, Please Contact Night-Floor Coverage  [email or web address] Password [facility] 04/16/2014, 6:06 AM

## 2014-04-16 NOTE — Progress Notes (Signed)
Patient admitted after midnight. Chart reviewed. Patient examined.  Off bipap. Comfortable and talkative on 2 l [place]. Will resume home meds, continue HHN, add pred starting tomorrow. Start diet, increase activity.  Transfer to [employer]. Needs to quit smoking. Has asbestosis and smokes 1ppd still.   Doree Barthel, MD [facility] [email or web address] password [facility]

## 2014-04-16 NOTE — ED Notes (Signed)
Pt. Has a hx of COPD and has SOB, but noticed increased SOB around 8pm last night. Wheezing in right lobe and received 10mg  of albuterol, .5 of atrovent, and 125 of solumedrol.

## 2014-04-16 NOTE — Progress Notes (Signed)
Inpatient Diabetes Program Recommendations  AACE/ADA: New Consensus Statement on Inpatient Glycemic Control (2013)  Target Ranges:  Prepandial:   less than 140 mg/dL      Peak postprandial:   less than 180 mg/dL (1-2 hours)      Critically ill patients:  140 - 180 mg/dL   Results for Ronald Bell, Ronald Bell (MRN [id number]) as of 04/16/2014 08:12  Ref. Range 04/16/2014 04:30  Glucose Latest Range: 70-99 mg/dL 210 (H)   Diabetes history: No Outpatient Diabetes medications: NA Current orders for Inpatient glycemic control: None  Inpatient Diabetes Program Recommendations Correction (SSI): Patient does not have a documented history of diabetes. Noted lab glucose of 210 mg/dl this morning at 4:30 am. Please consider ordering CBG with Novolog correction scale ACHS. HgbA1C: Please consider ordering an A1C to evaluate glycemic control over the past 2-3 months.  Thanks, Barnie Alderman, RN, MSN, CCRN, CDE Diabetes Coordinator Inpatient Diabetes Program [phone] (Team Pager) [phone] ([facility] office) [phone] [facility] [place] office)

## 2014-04-17 DIAGNOSIS — I10 Essential (primary) hypertension: Secondary | ICD-10-CM

## 2014-04-17 LAB — CBC
HEMATOCRIT: 35.6 % — AB (ref 39.0–52.0)
Hemoglobin: 12.2 g/dL — ABNORMAL LOW (ref 13.0–17.0)
MCH: 33.1 pg (ref 26.0–34.0)
MCHC: 34.3 g/dL (ref 30.0–36.0)
MCV: 96.5 fL (ref 78.0–100.0)
Platelets: 264 10*3/uL (ref 150–400)
RBC: 3.69 MIL/uL — AB (ref 4.22–5.81)
RDW: 13.3 % (ref 11.5–15.5)
WBC: 20.6 10*3/uL — ABNORMAL HIGH (ref 4.0–10.5)

## 2014-04-17 LAB — BASIC METABOLIC PANEL
ANION GAP: 7 (ref 5–15)
BUN: 18 mg/dL (ref 6–23)
CALCIUM: 9.1 mg/dL (ref 8.4–10.5)
CO2: 26 mmol/L (ref 19–32)
CREATININE: 0.75 mg/dL (ref 0.50–1.35)
Chloride: 99 mmol/L (ref 96–112)
GFR, EST NON AFRICAN AMERICAN: 88 mL/min — AB (ref >90)
Glucose, Bld: 99 mg/dL (ref 70–99)
Potassium: 4 mmol/L (ref 3.5–5.1)
SODIUM: 132 mmol/L — AB (ref 135–145)

## 2014-04-17 MED ORDER — AZITHROMYCIN 500 MG PO TABS: 500.0000 mg | ORAL_TABLET | Freq: Every day | ORAL | Status: DC

## 2014-04-17 MED ORDER — PREDNISONE 10 MG PO TABS: ORAL_TABLET | ORAL | Status: DC

## 2014-04-17 NOTE — Progress Notes (Signed)
Pt discharge instructions and prescriptions given, pt and family verbalized understanding.  VSS. Denies pain. Pt left floor via wheelchair accompanied by staff and family.

## 2014-04-17 NOTE — Discharge Summary (Signed)
Physician Discharge Summary  BRANDOL CORP HWE:[id number] DOB: 1940/04/22 DOA: 04/16/2014  PCP: [name], DO  Admit date: 04/16/2014 Discharge date: 04/17/2014  Time spent: greater than 30 minutes  Discharge Diagnoses:  Principal Problem:   COPD exacerbation Active Problems: Acute respiratory failure   Hyperlipidemia   Essential hypertension   Discharge Condition: stable  Filed Weights   04/16/14 0735 04/16/14 0937 04/16/14 2030  Weight: 59.3 kg (130 lb 11.7 oz) 59.3 kg (130 lb 11.7 oz) 59.24 kg (130 lb 9.6 oz)    History of present illness:  HPI: Ronald Bell is a 74 y.o. male with a history of COPD , and HTN who presents to the ED with complaints of increased SOB and chest Tightness and Wheezing since 8 pm. He denies chest pain, fevers or chills. He denies cough. He was hypoxic in the ED and placed on BiPAP.   Hospital Course:  Admitted to stepdown unit. Started on steroids, bronchodilators, antibiotics. Able to come off bipap quickly.  Breathing improved quickly. Breathing near baseline at discharge.  Ambulating. Normal vital signs and clear lung sounds  Procedures:  BiPAP  Consultations:  none  Discharge Exam: Filed Vitals:   04/17/14 1004  BP: 120/65  Pulse: 90  Temp: 98.2 F (36.8 C)  Resp: 16    General: comofortable Cardiovascular: RRR Respiratory: CTA  Discharge Instructions   Discharge Instructions    Diet - low sodium heart healthy    Complete by:  As directed      Increase activity slowly    Complete by:  As directed           Current Discharge Medication List    START taking these medications   Details  azithromycin (ZITHROMAX) 500 MG tablet Take 1 tablet (500 mg total) by mouth daily. Qty: 4 tablet, Refills: 0    predniSONE (DELTASONE) 10 MG tablet 3 tablets for 2 days, then decrease by 1 tablet every 2 days until off. Qty: 12 tablet, Refills: 0      CONTINUE these medications which have NOT CHANGED    Details  amLODipine (NORVASC) 10 MG tablet TAKE 1 TABLET (10 MG TOTAL) BY MOUTH DAILY. Qty: 90 tablet, Refills: 3   Associated Diagnoses: HTN (hypertension)    Ascorbic Acid (VITAMIN C) 250 MG tablet Take 500 mg by mouth daily.     aspirin 81 MG EC tablet Take 81 mg by mouth daily.      atorvastatin (LIPITOR) 40 MG tablet Take 1 tablet (40 mg total) by mouth daily. Qty: 90 tablet, Refills: 3   Associated Diagnoses: Other and unspecified hyperlipidemia    DOCUSATE SODIUM PO Take 1 tablet by mouth 2 (two) times daily.     fenofibrate 160 MG tablet Take 1 tablet (160 mg total) by mouth daily. Qty: 30 tablet, Refills: 2    Ferrous Sulfate (IRON) 325 (65 FE) MG TABS Take 1 tablet by mouth 2 (two) times daily. Qty: 90 each, Refills: 3   Associated Diagnoses: Anemia    Fluticasone Furoate-Vilanterol (BREO ELLIPTA) 100-25 MCG/INH AEPB Inhale 1 puff into the lungs daily. Qty: 3 each, Refills: 3    Ipratropium-Albuterol (COMBIVENT RESPIMAT) 20-100 MCG/ACT AERS respimat Inhale 1 puff into the lungs 4 (four) times daily as needed for wheezing. Qty: 1 Inhaler, Refills: 3   Associated Diagnoses: COPD (chronic obstructive pulmonary disease)    Multiple Vitamins-Minerals (CENTRUM SILVER PO) Take 1 tablet by mouth daily.     pantoprazole (PROTONIX) 40 MG tablet TAKE ONE  TABLET BY MOUTH ONCE DAILY Qty: 90 tablet, Refills: 3   Associated Diagnoses: GERD (gastroesophageal reflux disease)    valsartan-hydrochlorothiazide (DIOVAN HCT) 160-12.5 MG per tablet Take 1 tablet by mouth daily. Qty: 90 tablet, Refills: 3   Associated Diagnoses: HTN (hypertension)      STOP taking these medications     oxyCODONE-acetaminophen (PERCOCET/ROXICET) 5-325 MG per tablet        No Known Allergies Follow-up Information    Follow up with [name], DO.   Specialty:  Family Medicine   Why:  as previously scheduled   Contact information:   [place]  [phone]        The results of significant diagnostics from this hospitalization (including imaging, microbiology, ancillary and laboratory) are listed below for reference.    Significant Diagnostic Studies: Dg Chest Port 1 View  04/16/2014   CLINICAL DATA:  Shortness of breath.  History of emphysema.  EXAM: PORTABLE CHEST - 1 VIEW  COMPARISON:  02/09/2014  FINDINGS: Normal heart size and pulmonary vascularity. Emphysematous changes and scattered fibrosis in the lungs. Bilateral calcified pleural plaques. This suggest possible asbestos exposure. No focal consolidation in the lungs. No blunting of costophrenic angles. No pneumothorax. Calcified and tortuous aorta.  IMPRESSION: Emphysematous changes in the lungs. Calcified pleural plaques. No evidence of active disease.   Electronically Signed   By: Lucienne Capers M.D.   On: 04/16/2014 05:05    Microbiology: Recent Results (from the past 240 hour(s))  MRSA PCR Screening     Status: None   Collection Time: 04/16/14  8:08 AM  Result Value Ref Range Status   MRSA by PCR NEGATIVE NEGATIVE Final    Comment:        The GeneXpert MRSA Assay (FDA approved for NASAL specimens only), is one component of a comprehensive MRSA colonization surveillance program. It is not intended to diagnose MRSA infection nor to guide or monitor treatment for MRSA infections.      Labs: Basic Metabolic Panel:  Recent Labs Lab 04/16/14 0430 04/17/14 0615  NA 133* 132*  K 4.7 4.0  CL 100 99  CO2 26 26  GLUCOSE 210* 99  BUN 18 18  CREATININE 0.95 0.75  CALCIUM 9.4 9.1   Liver Function Tests: No results for input(s): AST, ALT, ALKPHOS, BILITOT, PROT, ALBUMIN in the last 168 hours. No results for input(s): LIPASE, AMYLASE in the last 168 hours. No results for input(s): AMMONIA in the last 168 hours. CBC:  Recent Labs Lab 04/16/14 0430 04/17/14 0615  WBC 19.1* 20.6*  NEUTROABS 13.2*  --   HGB 13.3 12.2*  HCT 39.5 35.6*  MCV  100.0 96.5  PLT 350 264   Cardiac Enzymes:  Recent Labs Lab 04/16/14 0430  TROPONINI <0.03   BNP: BNP (last 3 results) No results for input(s): PROBNP in the last 8760 hours. CBG:  Recent Labs Lab 04/16/14 0803  GLUCAP 135*       Signed:  [place]  [facility] 04/17/2014, 10:38 AM

## 2014-04-20 ENCOUNTER — Ambulatory Visit (INDEPENDENT_AMBULATORY_CARE_PROVIDER_SITE_OTHER)
Admission: RE | Admit: 2014-04-20 | Discharge: 2014-04-20 | Disposition: A | Discharge status: Home / Self Care | Payer: Medicare Other | Source: Ambulatory Visit | Attending: Internal Medicine | Admitting: Internal Medicine

## 2014-04-20 DIAGNOSIS — R918 Other nonspecific abnormal finding of lung field: Secondary | ICD-10-CM | POA: Diagnosis not present

## 2014-04-20 DIAGNOSIS — J929 Pleural plaque without asbestos: Secondary | ICD-10-CM | POA: Diagnosis not present

## 2014-04-25 ENCOUNTER — Encounter: Payer: Self-pay | Admitting: Cardiology

## 2014-04-25 ENCOUNTER — Telehealth: Payer: Self-pay

## 2014-04-25 ENCOUNTER — Telehealth: Payer: Self-pay | Admitting: Internal Medicine

## 2014-04-25 ENCOUNTER — Ambulatory Visit (INDEPENDENT_AMBULATORY_CARE_PROVIDER_SITE_OTHER): Payer: Medicare Other | Admitting: Cardiology

## 2014-04-25 ENCOUNTER — Encounter: Payer: Self-pay | Admitting: Internal Medicine

## 2014-04-25 VITALS — BP 132/50 | HR 92 | Ht 63.0 in | Wt 131.0 lb

## 2014-04-25 DIAGNOSIS — I739 Peripheral vascular disease, unspecified: Secondary | ICD-10-CM | POA: Diagnosis not present

## 2014-04-25 DIAGNOSIS — I350 Nonrheumatic aortic (valve) stenosis: Secondary | ICD-10-CM | POA: Diagnosis not present

## 2014-04-25 DIAGNOSIS — R918 Other nonspecific abnormal finding of lung field: Secondary | ICD-10-CM

## 2014-04-25 DIAGNOSIS — J920 Pleural plaque with presence of asbestos: Secondary | ICD-10-CM

## 2014-04-25 NOTE — Telephone Encounter (Signed)
Sorry I must have punched the wrong button  Ct is unchanged, radiology recs repeat in 12 m and I agree

## 2014-04-25 NOTE — Patient Instructions (Signed)
The current medical regimen is effective;  continue present plan and medications.  You have been referred to Dr Darylene Price to discuss treatment of your severe Aortic Stenosis and possible TAVR.  Follow up in 6 months with Dr. Marlou Porch.  You will receive a letter in the mail 2 months before you are due.  Please call us when you receive this letter to schedule your follow up appointment.  Thank you for choosing [facility]!!

## 2014-04-25 NOTE — Progress Notes (Signed)
[facility] [address] [place], [place]  [address] Phone: [phone] Fax:  [phone]  Date:  04/25/2014   ID:  Ronald Bell, Ronald Bell 11-07-1940, MRN [id number]  PCP:  [name], DO   History of Present Illness: Ronald Bell is a 74 y.o. male here for aortic stenosis follow up. Recently underwent knee replacement. While at home, developed severe respiratory issues and was hospitalized for a day and a half he states. He is feeling much better. Recent echocardiogram however demonstrated severe aortic stenosis as described below.  Dr. [name]. Smoking cessation. COPD. He has successfully quit smoking since that experience.   - LVEF 55-60%, normal wall thickness, borderline dilated LV, normal wall motion, diastolic dysfunction, likely elevated LV filling pressure, there is severe aortic stenosis, peak and mean gradients of 60 mmHG and 35 mmHg, respectively. AVA is around 0.8 cm2. Mild AI, severely dilated LA  Has a family history of heart disease with his brother and daughter. He carries diagnoses of hypertension and hyperlipidemia. Former Smoker quit in January 2016.  Hyperinflated lungs. Extensive pleural plaques noted. Coronary artery calcifications noted on CT scan 09/08/13 as well as aortic atherosclerosis.  Prior nuclear stress test 5 years ago showed no ischemia.  Wt Readings from Last 3 Encounters:  04/25/14 131 lb (59.421 kg)  04/16/14 130 lb 9.6 oz (59.24 kg)  03/08/14 132 lb (59.875 kg)     Past Medical History  Diagnosis Date  . Other symptoms involving cardiovascular system   . Polyp of nasal cavity   . Other specified disorders of arteries and arterioles   . Cervicalgia   . Routine general medical examination at a health care facility   . Rosacea   . Benign neoplasm of colon   . Personal history of tobacco use, presenting hazards to health   . Personal history of other diseases of digestive system   . Psychosexual dysfunction with  inhibited sexual excitement   . Chronic airway obstruction, not elsewhere classified   . Unspecified essential hypertension   . Other and unspecified hyperlipidemia   . GERD (gastroesophageal reflux disease)   . Anemia   . Arthritis   . Blood transfusion 2010    4 units  . Adenomatous colon polyp   . Peripheral vascular disease   . Irregular heartbeat   . Shingles   . Carotid artery occlusion   . Angiodysplasia of intestine with hemorrhage     Past Surgical History  Procedure Laterality Date  . Colon surgery    . Iliac artery stent Left 2005    CIA  . Esophagogastroduodenoscopy  2012    normal  . Colonoscopy  July 2015    Dr. Henrene Pastor  . Tonsillectomy    . Cardiac catheterization  2001  . Knee arthroscopy with medial menisectomy Left 03/08/2014    Procedure: LEFT KNEE SCOPE WITH MEDIAL MENISECTOMY AND CHONDROPLASTY;  Surgeon: Ninetta Lights, MD;  Location: [facility];  Service: Orthopedics;  Laterality: Left;    Current Outpatient Prescriptions  Medication Sig Dispense Refill  . amLODipine (NORVASC) 10 MG tablet TAKE 1 TABLET (10 MG TOTAL) BY MOUTH DAILY. 90 tablet 3  . Ascorbic Acid (VITAMIN C) 250 MG tablet Take 500 mg by mouth daily.     Marland Kitchen aspirin 81 MG EC tablet Take 81 mg by mouth daily.      Marland Kitchen atorvastatin (LIPITOR) 40 MG tablet Take 1 tablet (40 mg total) by mouth daily. 90 tablet 3  .  DOCUSATE SODIUM PO Take 1 tablet by mouth 2 (two) times daily.     . fenofibrate 160 MG tablet Take 1 tablet (160 mg total) by mouth daily. 30 tablet 2  . Ferrous Sulfate (IRON) 325 (65 FE) MG TABS Take 1 tablet by mouth 2 (two) times daily. 90 each 3  . Fluticasone Furoate-Vilanterol (BREO ELLIPTA) 100-25 MCG/INH AEPB Inhale 1 puff into the lungs daily. 3 each 3  . Ipratropium-Albuterol (COMBIVENT RESPIMAT) 20-100 MCG/ACT AERS respimat Inhale 1 puff into the lungs 4 (four) times daily as needed for wheezing. 1 Inhaler 3  . Multiple Vitamins-Minerals (CENTRUM SILVER PO) Take 1 tablet by mouth  daily.     . pantoprazole (PROTONIX) 40 MG tablet TAKE ONE TABLET BY MOUTH ONCE DAILY 90 tablet 3  . valsartan-hydrochlorothiazide (DIOVAN HCT) 160-12.5 MG per tablet Take 1 tablet by mouth daily. 90 tablet 3   No current facility-administered medications for this visit.    Allergies:   No Known Allergies  Social History:  The patient  reports that he has quit smoking. His smoking use included Cigarettes and E-cigarettes. He has a 41.25 pack-year smoking history. He has never used smokeless tobacco. He reports that he drinks about 2.4 oz of alcohol per week. He reports that he does not use illicit drugs.   Family History  Problem Relation Age of Onset  . Lung disease Mother     pulm fibrosis  . Uterine cancer    . Colitis Father   . Colon cancer Neg Hx   . Heart disease Brother   . Hypertension Brother   . Hyperlipidemia Brother   . Heart disease Daughter     cad  . Hypertension Son     ROS:  Please see the history of present illness.   Positive for knee pain, left. He denies any syncope, bleeding, orthopnea, PND, anginal symptoms. He has baseline shortness of breath with COPD. This has not increased recently. No strokelike symptoms, Dr. Donnetta Hutching.   All other systems reviewed and negative.   PHYSICAL EXAM: VS:  BP 132/50 mmHg  Pulse 92  Ht 5\' 3"  (1.6 m)  Wt 131 lb (59.421 kg)  BMI 23.21 kg/m2 Well nourished, well developed, in no acute distressUsing a cane HEENT: normal, [place]/AT, EOMI Neck: no JVD, normal carotid upstroke, Bilat bruit, loud Cardiac:  normal S1, S2; RRR; 3/6 systolic right upper sternal border murmur Lungs:  Diminished breath sounds bilaterally however clear to auscultation bilaterally, no wheezing, rhonchi or rales Abd: soft, nontender, no hepatomegaly, no bruits Ext: no edema, 2+ distal pulses Skin: warm and dry GU: deferred Neuro: no focal abnormalities noted, AAO x 3  EKG:    03/05/14-sinus rhythm, 84, no other significant abnormalities  noted. Echocardiogram: 04/06/14 - LVEF 55-60%, normal wall thickness, borderline dilated LV, normal wall motion, diastolic dysfunction, likely elevated LV filling pressure, there is severe aortic stenosis, peak and mean gradients of 60 mmHG and 35 mmHg, respectively. AVA is around 0.8 cm2. Mild AI, severely dilated LA  November/2013-normal ejection fraction 65% with moderate aortic stenosis 3.4 m/s, 28 mmHg mean gradient. Mild aortic valve regurgitation. Nuclear stress test 02/2011-low risk, no ischemia   Cardiac catheterization 2001- showed diffuse 40% stenosis of LAD, heavily calcified in midportion, circumflex calcification, right coronary artery 30-45% in the mid and proximal segment, subclavian 30-40% stenosis left as well as calcification of the ascending arch. Left internal iliac 50-60% stenosis, right internal iliac occluded, fed by collateral flow. 50% left renal artery stenosis.  ASSESSMENT AND PLAN:  1. Severe aortic stenosis-I will refer to Dr. Roxy Manns to evaluate. With his COPD, could he be a candidate for TAVR. He does have fairly significant peripheral vascular disease however. Please see catheterization report from 2001 showing completely occluded right internal iliac artery, left common iliac revealed 40% proximal stenosis. Diffuse atherosclerotic changes throughout distal aorta. I think that this would prohibit potential femoral artery approach.? Transapical. Obviously, if any procedure is decided upon, he will need another diagnostic coronary angiogram. He could have advancement of his coronary artery disease which previously was described as calcified but non-flow limiting. His recent hospitalization was secondary to respiratory difficulty. 2. Carotid artery disease-Dr. Donnetta Hutching. Doing well. 3. Essential hypertension-currently well controlled. Medications reviewed. 4. Hyperlipidemia-excellent use of statin with his peripheral vascular disease. 5. COPD-stable. Seeing Dr. [name]  soon. 6. Six-month follow-up.  Signed, Candee Furbish, MD [facility]  04/25/2014 11:01 AM

## 2014-04-25 NOTE — Telephone Encounter (Signed)
Called and spoke with pt and he is requesting his CT results from last Friday.  MW please advise. thanks

## 2014-04-25 NOTE — Telephone Encounter (Signed)
Admit date: 04/16/2014 Discharge date: 04/17/2014  Reason for admission:  COPD exacerbation  Spoke with patient who states that he is doing better.  No complaints at this time.  He has an CPE appointment scheduled for 05/10/14 and asked that Dr. Etter Sjogren see him then as he has multiple appointment with other providers throughout this month.

## 2014-04-26 NOTE — Telephone Encounter (Signed)
Called and spoke to pt. Informed pt of the results and recs per MW. Order placed for 1 year f/u CT. Pt verbalized understanding and denied any further questions or concerns at this time.

## 2014-04-26 NOTE — Telephone Encounter (Signed)
noted 

## 2014-05-10 ENCOUNTER — Encounter: Payer: Self-pay | Admitting: Family Medicine

## 2014-05-10 ENCOUNTER — Ambulatory Visit (INDEPENDENT_AMBULATORY_CARE_PROVIDER_SITE_OTHER): Payer: Medicare Other | Admitting: Family Medicine

## 2014-05-10 VITALS — BP 130/54 | HR 81 | Temp 97.4°F | Ht 63.0 in | Wt 135.0 lb

## 2014-05-10 DIAGNOSIS — N400 Enlarged prostate without lower urinary tract symptoms: Secondary | ICD-10-CM

## 2014-05-10 DIAGNOSIS — Z0000 Encounter for general adult medical examination without abnormal findings: Secondary | ICD-10-CM | POA: Diagnosis not present

## 2014-05-10 DIAGNOSIS — I10 Essential (primary) hypertension: Secondary | ICD-10-CM

## 2014-05-10 DIAGNOSIS — I350 Nonrheumatic aortic (valve) stenosis: Secondary | ICD-10-CM

## 2014-05-10 DIAGNOSIS — E785 Hyperlipidemia, unspecified: Secondary | ICD-10-CM

## 2014-05-10 DIAGNOSIS — J449 Chronic obstructive pulmonary disease, unspecified: Secondary | ICD-10-CM | POA: Diagnosis not present

## 2014-05-10 DIAGNOSIS — Z23 Encounter for immunization: Secondary | ICD-10-CM | POA: Diagnosis not present

## 2014-05-10 LAB — CBC WITH DIFFERENTIAL/PLATELET
BASOS ABS: 0.1 10*3/uL (ref 0.0–0.1)
Basophils Relative: 0.6 % (ref 0.0–3.0)
Eosinophils Absolute: 0.5 10*3/uL (ref 0.0–0.7)
Eosinophils Relative: 5.4 % — ABNORMAL HIGH (ref 0.0–5.0)
HCT: 36.2 % — ABNORMAL LOW (ref 39.0–52.0)
Hemoglobin: 12.2 g/dL — ABNORMAL LOW (ref 13.0–17.0)
LYMPHS ABS: 1 10*3/uL (ref 0.7–4.0)
Lymphocytes Relative: 10 % — ABNORMAL LOW (ref 12.0–46.0)
MCHC: 33.7 g/dL (ref 30.0–36.0)
MCV: 99.4 fl (ref 78.0–100.0)
Monocytes Absolute: 0.9 10*3/uL (ref 0.1–1.0)
Monocytes Relative: 9.5 % (ref 3.0–12.0)
NEUTROS PCT: 74.5 % (ref 43.0–77.0)
Neutro Abs: 7.4 10*3/uL (ref 1.4–7.7)
Platelets: 312 10*3/uL (ref 150.0–400.0)
RBC: 3.65 Mil/uL — ABNORMAL LOW (ref 4.22–5.81)
RDW: 14.3 % (ref 11.5–15.5)
WBC: 10 10*3/uL (ref 4.0–10.5)

## 2014-05-10 LAB — POCT URINALYSIS DIPSTICK
Bilirubin, UA: NEGATIVE
Glucose, UA: NEGATIVE
Ketones, UA: NEGATIVE
Leukocytes, UA: NEGATIVE
NITRITE UA: NEGATIVE
PH UA: 6
Protein, UA: NEGATIVE
RBC UA: NEGATIVE
Spec Grav, UA: 1.015
Urobilinogen, UA: NEGATIVE

## 2014-05-10 LAB — BASIC METABOLIC PANEL
BUN: 19 mg/dL (ref 6–23)
CHLORIDE: 99 meq/L (ref 96–112)
CO2: 28 meq/L (ref 19–32)
Calcium: 9.8 mg/dL (ref 8.4–10.5)
Creatinine, Ser: 0.99 mg/dL (ref 0.40–1.50)
GFR: 78.17 mL/min (ref >60.00)
Glucose, Bld: 95 mg/dL (ref 70–99)
Potassium: 4.5 mEq/L (ref 3.5–5.1)
SODIUM: 131 meq/L — AB (ref 135–145)

## 2014-05-10 LAB — PSA: PSA: 1.47 ng/mL (ref 0.10–4.00)

## 2014-05-10 LAB — HEPATIC FUNCTION PANEL
ALK PHOS: 48 U/L (ref 39–117)
ALT: 37 U/L (ref 0–53)
AST: 28 U/L (ref 0–37)
Albumin: 4.4 g/dL (ref 3.5–5.2)
Bilirubin, Direct: 0.1 mg/dL (ref 0.0–0.3)
Total Bilirubin: 0.4 mg/dL (ref 0.2–1.2)
Total Protein: 7 g/dL (ref 6.0–8.3)

## 2014-05-10 LAB — LIPID PANEL
Cholesterol: 154 mg/dL (ref 0–200)
HDL: 55.4 mg/dL (ref >39.00)
LDL CALC: 74 mg/dL (ref 0–99)
NONHDL: 98.6
TRIGLYCERIDES: 125 mg/dL (ref 0.0–149.0)
Total CHOL/HDL Ratio: 3
VLDL: 25 mg/dL (ref 0.0–40.0)

## 2014-05-10 MED ORDER — IPRATROPIUM-ALBUTEROL 20-100 MCG/ACT IN AERS: 1.0000 | INHALATION_SPRAY | Freq: Four times a day (QID) | RESPIRATORY_TRACT | Status: DC | PRN

## 2014-05-10 NOTE — Progress Notes (Signed)
Pre visit review using our clinic review tool, if applicable. No additional management support is needed unless otherwise documented below in the visit note. 

## 2014-05-10 NOTE — Progress Notes (Signed)
Subjective:    Ronald Bell is a 74 y.o. male who presents for Medicare Annual/Subsequent preventive examination.   Preventive Screening-Counseling & Management  Tobacco History  Smoking status  . Former Smoker -- 0.75 packs/day for 55 years  . Types: Cigarettes, E-cigarettes  . Quit date: 04/17/2014  Smokeless tobacco  . Never Used    Comment: pt states he knows he needs to quit. Also uses e-cig.    Problems Prior to Visit 1. Nothing new  Current Problems (verified) Patient Active Problem List   Diagnosis Date Noted  . COPD exacerbation 04/16/2014  . Multiple pulmonary nodules 03/02/2014  . 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 Pain of left thigh 11/14/2013  . Cough 03/27/2013  . Pleural plaque with presence of asbestos 03/27/2013  . PVD (peripheral vascular disease) 10/18/2012  . Occlusion and stenosis of carotid artery without mention of cerebral infarction 04/22/2012  . Postherpetic neuralgia 09/28/2011  . OTHER SPECIFIED DISEASE OF WHITE BLOOD CELLS 01/17/2010  . HEMOCCULT POSITIVE STOOL 01/17/2010  . ANEMIA-IRON DEFICIENCY 01/25/2009  . [facility] 01/25/2009  . ANGIODYSPLASIA-INTESTINE 01/25/2009  . PERSONAL HX COLONIC POLYPS 01/25/2009  . ANEMIA, SECONDARY TO BLOOD LOSS 12/03/2008  . GI BLEEDING 12/03/2008  . UNSPECIFIED ANEMIA 12/01/2008  . GERD 11/30/2008  . CAROTID BRUITS, BILATERAL 05/25/2008  . POLYP OF NASAL CAVITY 07/25/2007  . OTHER SPECIFIED DISORDERS OF ARTERIES&ARTERIOLES 06/24/2007  . NECK PAIN, LEFT 06/22/2007  . Benign Neoplasm of Colon 08/11/2006  . Hyperlipidemia 08/11/2006  . ERECTILE DYSFUNCTION 08/11/2006  . Essential hypertension 08/11/2006  . COPD GOLD III with min reversibilty  08/11/2006  . 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 BARRETT'S ESOPHAGUS, HX OF 08/11/2006  . Smoker 08/11/2006    Medications Prior to Visit Current Outpatient Prescriptions on File Prior to Visit  Medication Sig Dispense Refill  . amLODipine  (NORVASC) 10 MG tablet TAKE 1 TABLET (10 MG TOTAL) BY MOUTH DAILY. 90 tablet 3  . Ascorbic Acid (VITAMIN C) 250 MG tablet Take 500 mg by mouth daily.     Marland Kitchen aspirin 81 MG EC tablet Take 81 mg by mouth daily.      Marland Kitchen atorvastatin (LIPITOR) 40 MG tablet Take 1 tablet (40 mg total) by mouth daily. 90 tablet 3  . DOCUSATE SODIUM PO Take 1 tablet by mouth 2 (two) times daily.     . fenofibrate 160 MG tablet Take 1 tablet (160 mg total) by mouth daily. 30 tablet 2  . Ferrous Sulfate (IRON) 325 (65 FE) MG TABS Take 1 tablet by mouth 2 (two) times daily. 90 each 3  . Fluticasone Furoate-Vilanterol (BREO ELLIPTA) 100-25 MCG/INH AEPB Inhale 1 puff into the lungs daily. 3 each 3  . Ipratropium-Albuterol (COMBIVENT RESPIMAT) 20-100 MCG/ACT AERS respimat Inhale 1 puff into the lungs 4 (four) times daily as needed for wheezing. 1 Inhaler 3  . Multiple Vitamins-Minerals (CENTRUM SILVER PO) Take 1 tablet by mouth daily.     . pantoprazole (PROTONIX) 40 MG tablet TAKE ONE TABLET BY MOUTH ONCE DAILY 90 tablet 3  . valsartan-hydrochlorothiazide (DIOVAN HCT) 160-12.5 MG per tablet Take 1 tablet by mouth daily. 90 tablet 3   No current facility-administered medications on file prior to visit.    Current Medications (verified) Current Outpatient Prescriptions  Medication Sig Dispense Refill  . amLODipine (NORVASC) 10 MG tablet TAKE 1 TABLET (10 MG TOTAL) BY MOUTH DAILY. 90 tablet 3  . Ascorbic Acid (VITAMIN C) 250 MG tablet Take 500 mg by mouth daily.     Marland Kitchen  aspirin 81 MG EC tablet Take 81 mg by mouth daily.      Marland Kitchen atorvastatin (LIPITOR) 40 MG tablet Take 1 tablet (40 mg total) by mouth daily. 90 tablet 3  . DOCUSATE SODIUM PO Take 1 tablet by mouth 2 (two) times daily.     . fenofibrate 160 MG tablet Take 1 tablet (160 mg total) by mouth daily. 30 tablet 2  . Ferrous Sulfate (IRON) 325 (65 FE) MG TABS Take 1 tablet by mouth 2 (two) times daily. 90 each 3  . Fluticasone Furoate-Vilanterol (BREO ELLIPTA) 100-25  MCG/INH AEPB Inhale 1 puff into the lungs daily. 3 each 3  . Ipratropium-Albuterol (COMBIVENT RESPIMAT) 20-100 MCG/ACT AERS respimat Inhale 1 puff into the lungs 4 (four) times daily as needed for wheezing. 1 Inhaler 3  . Multiple Vitamins-Minerals (CENTRUM SILVER PO) Take 1 tablet by mouth daily.     . pantoprazole (PROTONIX) 40 MG tablet TAKE ONE TABLET BY MOUTH ONCE DAILY 90 tablet 3  . valsartan-hydrochlorothiazide (DIOVAN HCT) 160-12.5 MG per tablet Take 1 tablet by mouth daily. 90 tablet 3   No current facility-administered medications for this visit.     Allergies (verified) Review of patient's allergies indicates no known allergies.   PAST HISTORY  Family History Family History  Problem Relation Age of Onset  . Lung disease Mother     pulm fibrosis  . Uterine cancer    . Colitis Father   . Colon cancer Neg Hx   . Heart disease Brother   . Hypertension Brother   . Hyperlipidemia Brother   . Heart disease Daughter     cad  . Hypertension Son     Social History History  Substance Use Topics  . Smoking status: Former Smoker -- 0.75 packs/day for 55 years    Types: Cigarettes, E-cigarettes    Quit date: 04/17/2014  . Smokeless tobacco: Never Used     Comment: pt states he knows he needs to quit. Also uses e-cig.  Marland Kitchen Alcohol Use: 2.4 oz/week    4 Glasses of wine per week     Comment: occasional    Are there smokers in your home (other than you)?  No  Risk Factors Current exercise habits: The patient does not participate in regular exercise at present.  Dietary issues discussed: na   Cardiac risk factors: advanced age (older than 41 for men, 58 for women), dyslipidemia, hypertension, male gender, sedentary lifestyle and smoking/ tobacco exposure.  Depression Screen (Note: if answer to either of the following is "Yes", a more complete depression screening is indicated)   Q1: Over the past two weeks, have you felt down, depressed or hopeless? No  Q2: Over the past  two weeks, have you felt little interest or pleasure in doing things? No  Have you lost interest or pleasure in daily life? No  Do you often feel hopeless? No  Do you cry easily over simple problems? Yes  Activities of Daily Living In your present state of health, do you have any difficulty performing the following activities?:  Driving? No Managing money?  No Feeding yourself? No Getting from bed to chair? No Climbing a flight of stairs? No Preparing food and eating?: No Bathing or showering? No Getting dressed: No Getting to the toilet? No Using the toilet:No Moving around from place to place: No In the past year have you fallen or had a near fall?:No   Are you sexually active?  Yes  Do you have more than  one partner?  No  Hearing Difficulties: No Do you often ask people to speak up or repeat themselves? No Do you experience ringing or noises in your ears? No Do you have difficulty understanding soft or whispered voices? No   Do you feel that you have a problem with memory? No  Do you often misplace items? No  Do you feel safe at home?  Yes  Cognitive Testing  Alert? Yes  Normal Appearance?Yes  Oriented to person? Yes  Place? Yes   Time? Yes  Recall of three objects?  Yes  Can perform simple calculations? Yes  Displays appropriate judgment?Yes  Can read the correct time from a watch face?Yes   Advanced Directives have been discussed with the patient? Yes   List the Names of Other Physician/Practitioners you currently use: 1. Ortho-- murphy 2. Cardio-- skeins 3.  Thoracic--- owen, early 4.  oph--[facility] in [place] 5. pulm-- wert 6. GI--[facility]  Indicate any recent Medical Services you may have received from other than [facility] providers in the past year (date may be approximate).  Immunization History  Administered Date(s) Administered  . Influenza Split 01/23/2011, 02/12/2012  . Influenza Whole 01/28/2007, 12/14/2007, 12/14/2008, 01/17/2010  .  Influenza,inj,Quad PF,36+ Mos 02/17/2013  . Influenza-Unspecified 01/09/2014  . Pneumococcal Polysaccharide-23 12/18/2003, 11/30/2008  . Td 03/23/2002  . Tdap 03/03/2013  . Zoster 01/23/2011    Screening Tests Health Maintenance  Topic Date Due  . INFLUENZA VACCINE  10/22/2014  . COLONOSCOPY  10/24/2015  . TETANUS/TDAP  03/04/2023  . PNEUMOCOCCAL POLYSACCHARIDE VACCINE AGE 19 AND OVER  Completed  . ZOSTAVAX  Completed    All answers were reviewed with the patient and necessary referrals were made:  [name], DO   05/10/2014   History reviewed:  He  has a past medical history of Other symptoms involving cardiovascular system; Polyp of nasal cavity; Other specified disorders of arteries and arterioles; Cervicalgia; Routine general medical examination at a health care facility; Rosacea; Benign neoplasm of colon; Personal history of tobacco use, presenting hazards to health; Personal history of other diseases of digestive system; Psychosexual dysfunction with inhibited sexual excitement; Chronic airway obstruction, not elsewhere classified; Unspecified essential hypertension; Other and unspecified hyperlipidemia; GERD (gastroesophageal reflux disease); Anemia; Arthritis; Blood transfusion (2010); Adenomatous colon polyp; Peripheral vascular disease; Irregular heartbeat; Shingles; Carotid artery occlusion; and Angiodysplasia of intestine with hemorrhage. He  does not have any pertinent problems on file. He  has past surgical history that includes Colon surgery; Iliac artery stent (Left, 2005); Esophagogastroduodenoscopy (2012); Colonoscopy (July 2015); Tonsillectomy; Cardiac catheterization (2001); and Knee arthroscopy with medial menisectomy (Left, 03/08/2014). His family history includes Colitis in his father; Heart disease in his brother and daughter; Hyperlipidemia in his brother; Hypertension in his brother and son; Lung disease in his mother; Uterine cancer in an other family member.  There is no history of Colon cancer. He  reports that he quit smoking about 3 weeks ago. His smoking use included Cigarettes and E-cigarettes. He has a 41.25 pack-year smoking history. He has never used smokeless tobacco. He reports that he drinks about 2.4 oz of alcohol per week. He reports that he does not use illicit drugs. He has a current medication list which includes the following prescription(s): amlodipine, vitamin c, aspirin, atorvastatin, docusate sodium, fenofibrate, iron, fluticasone furoate-vilanterol, ipratropium-albuterol, multiple vitamins-minerals, pantoprazole, and valsartan-hydrochlorothiazide. Current Outpatient Prescriptions on File Prior to Visit  Medication Sig Dispense Refill  . amLODipine (NORVASC) 10 MG tablet TAKE 1 TABLET (10 MG TOTAL)  BY MOUTH DAILY. 90 tablet 3  . Ascorbic Acid (VITAMIN C) 250 MG tablet Take 500 mg by mouth daily.     Marland Kitchen aspirin 81 MG EC tablet Take 81 mg by mouth daily.      Marland Kitchen atorvastatin (LIPITOR) 40 MG tablet Take 1 tablet (40 mg total) by mouth daily. 90 tablet 3  . DOCUSATE SODIUM PO Take 1 tablet by mouth 2 (two) times daily.     . fenofibrate 160 MG tablet Take 1 tablet (160 mg total) by mouth daily. 30 tablet 2  . Ferrous Sulfate (IRON) 325 (65 FE) MG TABS Take 1 tablet by mouth 2 (two) times daily. 90 each 3  . Fluticasone Furoate-Vilanterol (BREO ELLIPTA) 100-25 MCG/INH AEPB Inhale 1 puff into the lungs daily. 3 each 3  . Ipratropium-Albuterol (COMBIVENT RESPIMAT) 20-100 MCG/ACT AERS respimat Inhale 1 puff into the lungs 4 (four) times daily as needed for wheezing. 1 Inhaler 3  . Multiple Vitamins-Minerals (CENTRUM SILVER PO) Take 1 tablet by mouth daily.     . pantoprazole (PROTONIX) 40 MG tablet TAKE ONE TABLET BY MOUTH ONCE DAILY 90 tablet 3  . valsartan-hydrochlorothiazide (DIOVAN HCT) 160-12.5 MG per tablet Take 1 tablet by mouth daily. 90 tablet 3   No current facility-administered medications on file prior to visit.   He has No  Known Allergies.  Review of Systems  Review of Systems  Constitutional: Negative for activity change, appetite change and fatigue.  HENT: Negative for hearing loss, congestion, tinnitus and ear discharge.   Eyes: Negative for visual disturbance (see optho q1y -- vision corrected to 20/20 with glasses).  Respiratory: Negative for cough, chest tightness and shortness of breath.   Cardiovascular: Negative for chest pain, palpitations and leg swelling.  Gastrointestinal: Negative for abdominal pain, diarrhea, constipation and abdominal distention.  Genitourinary: Negative for urgency, frequency, decreased urine volume and difficulty urinating.  Musculoskeletal: Negative for back pain, arthralgias and gait problem.  Skin: Negative for color change, pallor and rash.  Neurological: Negative for dizziness, light-headedness, numbness and headaches.  Hematological: Negative for adenopathy. Does not bruise/bleed easily.  Psychiatric/Behavioral: Negative for suicidal ideas, confusion, sleep disturbance, self-injury, dysphoric mood, decreased concentration and agitation.  Pt is able to read and write and can do all ADLs No risk for falling No abuse/ violence in home      Objective:     Vision by Snellen chart: opth Blood pressure 130/54, pulse 81, temperature 97.4 F (36.3 C), temperature source Oral, height 5\' 3"  (1.6 m), weight 135 lb (61.236 kg), SpO2 94 %. Body mass index is 23.92 kg/(m^2).  BP 130/54 mmHg  Pulse 81  Temp(Src) 97.4 F (36.3 C) (Oral)  Ht 5\' 3"  (1.6 m)  Wt 135 lb (61.236 kg)  BMI 23.92 kg/m2  SpO2 94% General appearance: alert, cooperative, appears stated age and no distress Head: Normocephalic, without obvious abnormality, atraumatic Eyes: conjunctivae/corneas clear. PERRL, EOM's intact. Fundi benign. Ears: normal TM's and external ear canals both ears Nose: Nares normal. Septum midline. Mucosa normal. No drainage or sinus tenderness. Throat: lips, mucosa, and  tongue normal; teeth and gums normal Neck: no adenopathy, no carotid bruit, no JVD, supple, symmetrical, trachea midline and thyroid not enlarged, symmetric, no tenderness/mass/nodules Back: symmetric, no curvature. ROM normal. No CVA tenderness. Lungs: clear to auscultation bilaterally Chest wall: no tenderness Heart: regular rate and rhythm, S1, S2 normal, no murmur, click, rub or gallop Abdomen: soft, non-tender; bowel sounds normal; no masses,  no organomegaly Male genitalia: normal, penis:  no lesions or discharge. testes: no masses or tenderness. no hernias Rectal: normal tone, normal prostate, no masses or tenderness and heme +  sool===pt on iron Extremities: extremities normal, atraumatic, no cyanosis or edema, no edema, redness or tenderness in the calves or thighs and varicose veins noted Pulses: 2+ and symmetric Skin: Skin color, texture, turgor normal. No rashes or lesions Lymph nodes: Cervical, supraclavicular, and axillary nodes normal. Neurologic: Alert and oriented X 3, normal strength and tone. Normal symmetric reflexes. Normal coordination and gait Psych--no depression, no anxiety      Assessment:     cpe      Plan:     During the course of the visit the patient was educated and counseled about appropriate screening and preventive services including:    Pneumococcal vaccine   Prostate cancer screening  Colorectal cancer screening  Diabetes screening  Glaucoma screening  Advanced directives: has an advanced directive - a copy HAS NOT been provided.  Diet review for nutrition referral? Yes ____  Not Indicated _x___   Patient Instructions (the written plan) was given to the patient.  Medicare Attestation I have personally reviewed: The patient's medical and social history Their use of alcohol, tobacco or illicit drugs Their current medications and supplements The patient's functional ability including ADLs,fall risks, home safety risks, cognitive, and  hearing and visual impairment Diet and physical activities Evidence for depression or mood disorders  The patient's weight, height, BMI, and visual acuity have been recorded in the chart.  I have made referrals, counseling, and provided education to the patient based on review of the above and I have provided the patient with a written personalized care plan for preventive services.    1. Chronic obstructive pulmonary disease, unspecified COPD, unspecified chronic bronchitis type   - Ipratropium-Albuterol (COMBIVENT RESPIMAT) 20-100 MCG/ACT AERS respimat; Inhale 1 puff into the lungs 4 (four) times daily as needed for wheezing.  Dispense: 1 Inhaler; Refill: 3  2. Preventative health care    3. Essential hypertension  stable - Basic metabolic panel - CBC with Differential/Platelet - POCT urinalysis dipstick  4. Hyperlipidemia Cont Lipitor and fenofibrate - Hepatic function panel - Lipid panel  5. BPH (benign prostatic hypertrophy)   - PSA - POCT urinalysis dipstick  6. Aortic stenosis, severe    7. Routine history and physical examination of adult   8. Need for pneumococcal vaccination  - Pneumococcal conjugate vaccine 13-valent  [name], DO   05/10/2014

## 2014-05-10 NOTE — Patient Instructions (Signed)
Preventive Care for Adults A healthy lifestyle and preventive care can promote health and wellness. Preventive health guidelines for men include the following key practices:  A routine yearly physical is a good way to check with your health care provider about your health and preventative screening. It is a chance to share any concerns and updates on your health and to receive a thorough exam.  Visit your dentist for a routine exam and preventative care every 6 months. Brush your teeth twice a day and floss once a day. Good oral hygiene prevents tooth decay and gum disease.  The frequency of eye exams is based on your age, health, family medical history, use of contact lenses, and other factors. Follow your health care provider's recommendations for frequency of eye exams.  Eat a healthy diet. Foods such as vegetables, fruits, whole grains, low-fat dairy products, and lean protein foods contain the nutrients you need without too many calories. Decrease your intake of foods high in solid fats, added sugars, and salt. Eat the right amount of calories for you.Get information about a proper diet from your health care provider, if necessary.  Regular physical exercise is one of the most important things you can do for your health. Most adults should get at least 150 minutes of moderate-intensity exercise (any activity that increases your heart rate and causes you to sweat) each week. In addition, most adults need muscle-strengthening exercises on 2 or more days a week.  Maintain a healthy weight. The body mass index (BMI) is a screening tool to identify possible weight problems. It provides an estimate of body fat based on height and weight. Your health care provider can find your BMI and can help you achieve or maintain a healthy weight.For adults 20 years and older:  A BMI below 18.5 is considered underweight.  A BMI of 18.5 to 24.9 is normal.  A BMI of 25 to 29.9 is considered overweight.  A BMI  of 30 and above is considered obese.  Maintain normal blood lipids and cholesterol levels by exercising and minimizing your intake of saturated fat. Eat a balanced diet with plenty of fruit and vegetables. Blood tests for lipids and cholesterol should begin at age 50 and be repeated every 5 years. If your lipid or cholesterol levels are high, you are over 50, or you are at high risk for heart disease, you may need your cholesterol levels checked more frequently.Ongoing high lipid and cholesterol levels should be treated with medicines if diet and exercise are not working.  If you smoke, find out from your health care provider how to quit. If you do not use tobacco, do not start.  Lung cancer screening is recommended for adults aged 73-80 years who are at high risk for developing lung cancer because of a history of smoking. A yearly low-dose CT scan of the lungs is recommended for people who have at least a 30-pack-year history of smoking and are a current smoker or have quit within the past 15 years. A pack year of smoking is smoking an average of 1 pack of cigarettes a day for 1 year (for example: 1 pack a day for 30 years or 2 packs a day for 15 years). Yearly screening should continue until the smoker has stopped smoking for at least 15 years. Yearly screening should be stopped for people who develop a health problem that would prevent them from having lung cancer treatment.  If you choose to drink alcohol, do not have more than  2 drinks per day. One drink is considered to be 12 ounces (355 mL) of beer, 5 ounces (148 mL) of wine, or 1.5 ounces (44 mL) of liquor.  Avoid use of street drugs. Do not share needles with anyone. Ask for help if you need support or instructions about stopping the use of drugs.  High blood pressure causes heart disease and increases the risk of stroke. Your blood pressure should be checked at least every 1-2 years. Ongoing high blood pressure should be treated with  medicines, if weight loss and exercise are not effective.  If you are 52-40 years old, ask your health care provider if you should take aspirin to prevent heart disease.  Diabetes screening involves taking a blood sample to check your fasting blood sugar level. This should be done once every 3 years, after age 22, if you are within normal weight and without risk factors for diabetes. Testing should be considered at a younger age or be carried out more frequently if you are overweight and have at least 1 risk factor for diabetes.  Colorectal cancer can be detected and often prevented. Most routine colorectal cancer screening begins at the age of 23 and continues through age 23. However, your health care provider may recommend screening at an earlier age if you have risk factors for colon cancer. On a yearly basis, your health care provider may provide home test kits to check for hidden blood in the stool. Use of a small camera at the end of a tube to directly examine the colon (sigmoidoscopy or colonoscopy) can detect the earliest forms of colorectal cancer. Talk to your health care provider about this at age 20, when routine screening begins. Direct exam of the colon should be repeated every 5-10 years through age 78, unless early forms of precancerous polyps or small growths are found.  People who are at an increased risk for hepatitis B should be screened for this virus. You are considered at high risk for hepatitis B if:  You were born in a country where hepatitis B occurs often. Talk with your health care provider about which countries are considered high risk.  Your parents were born in a high-risk country and you have not received a shot to protect against hepatitis B (hepatitis B vaccine).  You have HIV or AIDS.  You use needles to inject street drugs.  You live with, or have sex with, someone who has hepatitis B.  You are a man who has sex with other men (MSM).  You get hemodialysis  treatment.  You take certain medicines for conditions such as cancer, organ transplantation, and autoimmune conditions.  Hepatitis C blood testing is recommended for all people born from [birth year] through 1965 and any individual with known risks for hepatitis C.  Practice safe sex. Use condoms and avoid high-risk sexual practices to reduce the spread of sexually transmitted infections (STIs). STIs include gonorrhea, chlamydia, syphilis, trichomonas, herpes, HPV, and human immunodeficiency virus (HIV). Herpes, HIV, and HPV are viral illnesses that have no cure. They can result in disability, cancer, and death.  If you are at risk of being infected with HIV, it is recommended that you take a prescription medicine daily to prevent HIV infection. This is called preexposure prophylaxis (PrEP). You are considered at risk if:  You are a man who has sex with other men (MSM) and have other risk factors.  You are a heterosexual man, are sexually active, and are at increased risk for HIV infection.  You take drugs by injection.  You are sexually active with a partner who has HIV.  Talk with your health care provider about whether you are at high risk of being infected with HIV. If you choose to begin PrEP, you should first be tested for HIV. You should then be tested every 3 months for as long as you are taking PrEP.  A one-time screening for abdominal aortic aneurysm (AAA) and surgical repair of large AAAs by ultrasound are recommended for men ages 32 to 67 years who are current or former smokers.  Healthy men should no longer receive prostate-specific antigen (PSA) blood tests as part of routine cancer screening. Talk with your health care provider about prostate cancer screening.  Testicular cancer screening is not recommended for adult males who have no symptoms. Screening includes self-exam, a health care provider exam, and other screening tests. Consult with your health care provider about any symptoms  you have or any concerns you have about testicular cancer.  Use sunscreen. Apply sunscreen liberally and repeatedly throughout the day. You should seek shade when your shadow is shorter than you. Protect yourself by wearing long sleeves, pants, a wide-brimmed hat, and sunglasses year round, whenever you are outdoors.  Once a month, do a whole-body skin exam, using a mirror to look at the skin on your back. Tell your health care provider about new moles, moles that have irregular borders, moles that are larger than a pencil eraser, or moles that have changed in shape or color.  Stay current with required vaccines (immunizations).  Influenza vaccine. All adults should be immunized every year.  Tetanus, diphtheria, and acellular pertussis (Td, Tdap) vaccine. An adult who has not previously received Tdap or who does not know his vaccine status should receive 1 dose of Tdap. This initial dose should be followed by tetanus and diphtheria toxoids (Td) booster doses every 10 years. Adults with an unknown or incomplete history of completing a 3-dose immunization series with Td-containing vaccines should begin or complete a primary immunization series including a Tdap dose. Adults should receive a Td booster every 10 years.  Varicella vaccine. An adult without evidence of immunity to varicella should receive 2 doses or a second dose if he has previously received 1 dose.  Human papillomavirus (HPV) vaccine. Males aged 68-21 years who have not received the vaccine previously should receive the 3-dose series. Males aged 22-26 years may be immunized. Immunization is recommended through the age of 6 years for any male who has sex with males and did not get any or all doses earlier. Immunization is recommended for any person with an immunocompromised condition through the age of 49 years if he did not get any or all doses earlier. During the 3-dose series, the second dose should be obtained 4-8 weeks after the first  dose. The third dose should be obtained 24 weeks after the first dose and 16 weeks after the second dose.  Zoster vaccine. One dose is recommended for adults aged 50 years or older unless certain conditions are present.  Measles, mumps, and rubella (MMR) vaccine. Adults born before [birth year] generally are considered immune to measles and mumps. Adults born in [birth year] or later should have 1 or more doses of MMR vaccine unless there is a contraindication to the vaccine or there is laboratory evidence of immunity to each of the three diseases. A routine second dose of MMR vaccine should be obtained at least 28 days after the first dose for students attending postsecondary  schools, health care workers, or international travelers. People who received inactivated measles vaccine or an unknown type of measles vaccine during 1963-1967 should receive 2 doses of MMR vaccine. People who received inactivated mumps vaccine or an unknown type of mumps vaccine before 1979 and are at high risk for mumps infection should consider immunization with 2 doses of MMR vaccine. Unvaccinated health care workers born before 1957 who lack laboratory evidence of measles, mumps, or rubella immunity or laboratory confirmation of disease should consider measles and mumps immunization with 2 doses of MMR vaccine or rubella immunization with 1 dose of MMR vaccine.  Pneumococcal 13-valent conjugate (PCV13) vaccine. When indicated, a person who is uncertain of his immunization history and has no record of immunization should receive the PCV13 vaccine. An adult aged 19 years or older who has certain medical conditions and has not been previously immunized should receive 1 dose of PCV13 vaccine. This PCV13 should be followed with a dose of pneumococcal polysaccharide (PPSV23) vaccine. The PPSV23 vaccine dose should be obtained at least 8 weeks after the dose of PCV13 vaccine. An adult aged 19 years or older who has certain medical conditions and  previously received 1 or more doses of PPSV23 vaccine should receive 1 dose of PCV13. The PCV13 vaccine dose should be obtained 1 or more years after the last PPSV23 vaccine dose.  Pneumococcal polysaccharide (PPSV23) vaccine. When PCV13 is also indicated, PCV13 should be obtained first. All adults aged 65 years and older should be immunized. An adult younger than age 65 years who has certain medical conditions should be immunized. Any person who resides in a nursing home or long-term care facility should be immunized. An adult smoker should be immunized. People with an immunocompromised condition and certain other conditions should receive both PCV13 and PPSV23 vaccines. People with human immunodeficiency virus (HIV) infection should be immunized as soon as possible after diagnosis. Immunization during chemotherapy or radiation therapy should be avoided. Routine use of PPSV23 vaccine is not recommended for American Indians, [place] Natives, or people younger than 65 years unless there are medical conditions that require PPSV23 vaccine. When indicated, people who have unknown immunization and have no record of immunization should receive PPSV23 vaccine. One-time revaccination 5 years after the first dose of PPSV23 is recommended for people aged 19-64 years who have chronic kidney failure, nephrotic syndrome, asplenia, or immunocompromised conditions. People who received 1-2 doses of PPSV23 before age 65 years should receive another dose of PPSV23 vaccine at age 65 years or later if at least 5 years have passed since the previous dose. Doses of PPSV23 are not needed for people immunized with PPSV23 at or after age 65 years.  Meningococcal vaccine. Adults with asplenia or persistent complement component deficiencies should receive 2 doses of quadrivalent meningococcal conjugate (MenACWY-D) vaccine. The doses should be obtained at least 2 months apart. Microbiologists working with certain meningococcal bacteria,  military recruits, people at risk during an outbreak, and people who travel to or live in countries with a high rate of meningitis should be immunized. A first-year college student up through age 21 years who is living in a residence hall should receive a dose if he did not receive a dose on or after his 16th birthday. Adults who have certain high-risk conditions should receive one or more doses of vaccine.  Hepatitis A vaccine. Adults who wish to be protected from this disease, have certain high-risk conditions, work with hepatitis A-infected animals, work in hepatitis A research labs, or   travel to or work in countries with a high rate of hepatitis A should be immunized. Adults who were previously unvaccinated and who anticipate close contact with an international adoptee during the first 60 days after arrival in the [place] from a country with a high rate of hepatitis A should be immunized.  Hepatitis B vaccine. Adults should be immunized if they wish to be protected from this disease, have certain high-risk conditions, may be exposed to blood or other infectious body fluids, are household contacts or sex partners of hepatitis B positive people, are clients or workers in certain care facilities, or travel to or work in countries with a high rate of hepatitis B.  Haemophilus influenzae type b (Hib) vaccine. A previously unvaccinated person with asplenia or sickle cell disease or having a scheduled splenectomy should receive 1 dose of Hib vaccine. Regardless of previous immunization, a recipient of a hematopoietic stem cell transplant should receive a 3-dose series 6-12 months after his successful transplant. Hib vaccine is not recommended for adults with HIV infection. Preventive Service / Frequency Ages 45 to 66  Blood pressure check.** / Every 1 to 2 years.  Lipid and cholesterol check.** / Every 5 years beginning at age 72.  Hepatitis C blood test.** / For any individual with known risks for  hepatitis C.  Skin self-exam. / Monthly.  Influenza vaccine. / Every year.  Tetanus, diphtheria, and acellular pertussis (Tdap, Td) vaccine.** / Consult your health care provider. 1 dose of Td every 10 years.  Varicella vaccine.** / Consult your health care provider.  HPV vaccine. / 3 doses over 6 months, if 73 or younger.  Measles, mumps, rubella (MMR) vaccine.** / You need at least 1 dose of MMR if you were born in 1957 or later. You may also need a second dose.  Pneumococcal 13-valent conjugate (PCV13) vaccine.** / Consult your health care provider.  Pneumococcal polysaccharide (PPSV23) vaccine.** / 1 to 2 doses if you smoke cigarettes or if you have certain conditions.  Meningococcal vaccine.** / 1 dose if you are age 100 to 81 years and a Market researcher living in a residence hall, or have one of several medical conditions. You may also need additional booster doses.  Hepatitis A vaccine.** / Consult your health care provider.  Hepatitis B vaccine.** / Consult your health care provider.  Haemophilus influenzae type b (Hib) vaccine.** / Consult your health care provider. Ages 14 to 70  Blood pressure check.** / Every 1 to 2 years.  Lipid and cholesterol check.** / Every 5 years beginning at age 27.  Lung cancer screening. / Every year if you are aged 59-80 years and have a 30-pack-year history of smoking and currently smoke or have quit within the past 15 years. Yearly screening is stopped once you have quit smoking for at least 15 years or develop a health problem that would prevent you from having lung cancer treatment.  Fecal occult blood test (FOBT) of stool. / Every year beginning at age 55 and continuing until age 67. You may not have to do this test if you get a colonoscopy every 10 years.  Flexible sigmoidoscopy** or colonoscopy.** / Every 5 years for a flexible sigmoidoscopy or every 10 years for a colonoscopy beginning at age 26 and continuing until age  31.  Hepatitis C blood test.** / For all people born from [birth year] through 1967 and any individual with known risks for hepatitis C.  Skin self-exam. / Monthly.  Influenza vaccine. / Every  year.  Tetanus, diphtheria, and acellular pertussis (Tdap/Td) vaccine.** / Consult your health care provider. 1 dose of Td every 10 years.  Varicella vaccine.** / Consult your health care provider.  Zoster vaccine.** / 1 dose for adults aged 53 years or older.  Measles, mumps, rubella (MMR) vaccine.** / You need at least 1 dose of MMR if you were born in 1959 or later. You may also need a second dose.  Pneumococcal 13-valent conjugate (PCV13) vaccine.** / Consult your health care provider.  Pneumococcal polysaccharide (PPSV23) vaccine.** / 1 to 2 doses if you smoke cigarettes or if you have certain conditions.  Meningococcal vaccine.** / Consult your health care provider.  Hepatitis A vaccine.** / Consult your health care provider.  Hepatitis B vaccine.** / Consult your health care provider.  Haemophilus influenzae type b (Hib) vaccine.** / Consult your health care provider. Ages 77 and over  Blood pressure check.** / Every 1 to 2 years.  Lipid and cholesterol check.**/ Every 5 years beginning at age 85.  Lung cancer screening. / Every year if you are aged 55-80 years and have a 30-pack-year history of smoking and currently smoke or have quit within the past 15 years. Yearly screening is stopped once you have quit smoking for at least 15 years or develop a health problem that would prevent you from having lung cancer treatment.  Fecal occult blood test (FOBT) of stool. / Every year beginning at age 33 and continuing until age 11. You may not have to do this test if you get a colonoscopy every 10 years.  Flexible sigmoidoscopy** or colonoscopy.** / Every 5 years for a flexible sigmoidoscopy or every 10 years for a colonoscopy beginning at age 28 and continuing until age 73.  Hepatitis C blood  test.** / For all people born from [birth year] through 1967 and any individual with known risks for hepatitis C.  Abdominal aortic aneurysm (AAA) screening.** / A one-time screening for ages 50 to 27 years who are current or former smokers.  Skin self-exam. / Monthly.  Influenza vaccine. / Every year.  Tetanus, diphtheria, and acellular pertussis (Tdap/Td) vaccine.** / 1 dose of Td every 10 years.  Varicella vaccine.** / Consult your health care provider.  Zoster vaccine.** / 1 dose for adults aged 34 years or older.  Pneumococcal 13-valent conjugate (PCV13) vaccine.** / Consult your health care provider.  Pneumococcal polysaccharide (PPSV23) vaccine.** / 1 dose for all adults aged 63 years and older.  Meningococcal vaccine.** / Consult your health care provider.  Hepatitis A vaccine.** / Consult your health care provider.  Hepatitis B vaccine.** / Consult your health care provider.  Haemophilus influenzae type b (Hib) vaccine.** / Consult your health care provider. **Family history and personal history of risk and conditions may change your health care provider's recommendations. Document Released: 05/05/2001 Document Revised: 03/14/2013 Document Reviewed: 08/04/2010 [facility] Patient Information 2015 [place], [place]. This information is not intended to replace advice given to you by your health care provider. Make sure you discuss any questions you have with your health care provider.

## 2014-05-31 ENCOUNTER — Emergency Department (HOSPITAL_COMMUNITY): Payer: Medicare Other

## 2014-05-31 ENCOUNTER — Inpatient Hospital Stay (HOSPITAL_COMMUNITY)
Admission: EM | Admit: 2014-05-31 | Discharge: 2014-06-02 | DRG: 190 | Disposition: A | Discharge status: Home / Self Care | Payer: Medicare Other | Attending: Internal Medicine | Admitting: Internal Medicine

## 2014-05-31 DIAGNOSIS — R0602 Shortness of breath: Secondary | ICD-10-CM | POA: Diagnosis not present

## 2014-05-31 DIAGNOSIS — K5521 Angiodysplasia of colon with hemorrhage: Secondary | ICD-10-CM | POA: Diagnosis not present

## 2014-05-31 DIAGNOSIS — D539 Nutritional anemia, unspecified: Secondary | ICD-10-CM

## 2014-05-31 DIAGNOSIS — K219 Gastro-esophageal reflux disease without esophagitis: Secondary | ICD-10-CM | POA: Diagnosis present

## 2014-05-31 DIAGNOSIS — J441 Chronic obstructive pulmonary disease with (acute) exacerbation: Principal | ICD-10-CM | POA: Diagnosis present

## 2014-05-31 DIAGNOSIS — E785 Hyperlipidemia, unspecified: Secondary | ICD-10-CM | POA: Diagnosis present

## 2014-05-31 DIAGNOSIS — I499 Cardiac arrhythmia, unspecified: Secondary | ICD-10-CM | POA: Diagnosis present

## 2014-05-31 DIAGNOSIS — Z87891 Personal history of nicotine dependence: Secondary | ICD-10-CM

## 2014-05-31 DIAGNOSIS — B029 Zoster without complications: Secondary | ICD-10-CM | POA: Diagnosis not present

## 2014-05-31 DIAGNOSIS — I10 Essential (primary) hypertension: Secondary | ICD-10-CM | POA: Diagnosis present

## 2014-05-31 DIAGNOSIS — M1990 Unspecified osteoarthritis, unspecified site: Secondary | ICD-10-CM | POA: Diagnosis present

## 2014-05-31 DIAGNOSIS — Z7982 Long term (current) use of aspirin: Secondary | ICD-10-CM

## 2014-05-31 DIAGNOSIS — I739 Peripheral vascular disease, unspecified: Secondary | ICD-10-CM | POA: Diagnosis present

## 2014-05-31 MED ORDER — IPRATROPIUM-ALBUTEROL 0.5-2.5 (3) MG/3ML IN SOLN
3.0000 mL | Freq: Once | RESPIRATORY_TRACT | Status: AC
  Administered 2014-05-31: 3 mL via RESPIRATORY_TRACT
  Filled 2014-05-31: qty 3

## 2014-05-31 MED ORDER — IPRATROPIUM-ALBUTEROL 0.5-2.5 (3) MG/3ML IN SOLN
3.0000 mL | Freq: Once | RESPIRATORY_TRACT | Status: AC
  Administered 2014-06-01: 3 mL via RESPIRATORY_TRACT
  Filled 2014-05-31: qty 3

## 2014-05-31 NOTE — ED Notes (Signed)
Portable x-ray at the bedside.  

## 2014-05-31 NOTE — ED Provider Notes (Signed)
CSN: [id number]     Arrival date & time 05/31/14  2255 History  This chart was scribed for Delora Fuel, MD by Molli Posey, ED Scribe. This patient was seen in room A04C/A04C and the patient's care was started 11:02 PM.    Chief Complaint  Patient presents with  . Shortness of Breath   The history is provided by the patient. No language interpreter was used.   HPI Comments: Ronald Bell is a 74 y.o. male with a history of chronic airway obstruction who presents to the Emergency Department complaining of SOB that started this morning. Pt states that his SOB worsened at [place] when he went to bed and says he "couldn't breath." He says that his SOB worsens when he lays down or with exertion. Pt reports an associated unproductive cough as well. He says that he used Breo inhaler in the morning for his symptoms. Per EMS, pt was administered solu-medrol and albuterol en route and state that his initial oxygen saturation was 88%. Pt reports he was in ED 2 months ago for a similar episode. Pt states he quit smoking 3 months ago. He denies CP, chest heaviness or tightness, nausea, vomiting or fever at this time.    Past Medical History  Diagnosis Date  . Other symptoms involving cardiovascular system   . Polyp of nasal cavity   . Other specified disorders of arteries and arterioles   . Cervicalgia   . Routine general medical examination at a health care facility   . Rosacea   . Benign neoplasm of colon   . Personal history of tobacco use, presenting hazards to health   . Personal history of other diseases of digestive system   . Psychosexual dysfunction with inhibited sexual excitement   . Chronic airway obstruction, not elsewhere classified   . Unspecified essential hypertension   . Other and unspecified hyperlipidemia   . GERD (gastroesophageal reflux disease)   . Anemia   . Arthritis   . Blood transfusion 2010    4 units  . Adenomatous colon polyp   . Peripheral vascular  disease   . Irregular heartbeat   . Shingles   . Carotid artery occlusion   . Angiodysplasia of intestine with hemorrhage    Past Surgical History  Procedure Laterality Date  . Colon surgery    . Iliac artery stent Left 2005    CIA  . Esophagogastroduodenoscopy  2012    normal  . Colonoscopy  July 2015    Dr. Henrene Pastor  . Tonsillectomy    . Cardiac catheterization  2001  . Knee arthroscopy with medial menisectomy Left 03/08/2014    Procedure: LEFT KNEE SCOPE WITH MEDIAL MENISECTOMY AND CHONDROPLASTY;  Surgeon: Ninetta Lights, MD;  Location: [facility];  Service: Orthopedics;  Laterality: Left;   Family History  Problem Relation Age of Onset  . Lung disease Mother     pulm fibrosis  . Uterine cancer    . Colitis Father   . Colon cancer Neg Hx   . Heart disease Brother   . Hypertension Brother   . Hyperlipidemia Brother   . Heart disease Daughter     cad  . Hypertension Son    History  Substance Use Topics  . Smoking status: Former Smoker -- 0.75 packs/day for 55 years    Types: Cigarettes, E-cigarettes    Quit date: 04/17/2014  . Smokeless tobacco: Never Used     Comment: pt states he knows he needs to quit.  Also uses e-cig.  Marland Kitchen Alcohol Use: 2.4 oz/week    4 Glasses of wine per week     Comment: occasional    Review of Systems  Constitutional: Negative for fever.  Respiratory: Positive for cough and shortness of breath. Negative for chest tightness.   Cardiovascular: Negative for chest pain.  Gastrointestinal: Negative for nausea and vomiting.  All other systems reviewed and are negative.     Allergies  Review of patient's allergies indicates no known allergies.  Home Medications   Prior to Admission medications   Medication Sig Start Date End Date Taking? Authorizing Provider  amLODipine (NORVASC) 10 MG tablet TAKE 1 TABLET (10 MG TOTAL) BY MOUTH DAILY. 08/21/13   Rosalita Chessman, DO  Ascorbic Acid (VITAMIN C) 250 MG tablet Take 500 mg by mouth daily.      Historical Provider, MD  aspirin 81 MG EC tablet Take 81 mg by mouth daily.      Historical Provider, MD  atorvastatin (LIPITOR) 40 MG tablet Take 1 tablet (40 mg total) by mouth daily. 08/21/13   Rosalita Chessman, DO  DOCUSATE SODIUM PO Take 1 tablet by mouth 2 (two) times daily.     Historical Provider, MD  fenofibrate 160 MG tablet Take 1 tablet (160 mg total) by mouth daily. 03/13/14   Rosalita Chessman, DO  Ferrous Sulfate (IRON) 325 (65 FE) MG TABS Take 1 tablet by mouth 2 (two) times daily. 07/25/10   Alferd Apa Lowne, DO  Fluticasone Furoate-Vilanterol (BREO ELLIPTA) 100-25 MCG/INH AEPB Inhale 1 puff into the lungs daily. 12/01/13   Tanda Rockers, MD  Ipratropium-Albuterol (COMBIVENT RESPIMAT) 20-100 MCG/ACT AERS respimat Inhale 1 puff into the lungs 4 (four) times daily as needed for wheezing. 05/10/14   Rosalita Chessman, DO  Multiple Vitamins-Minerals (CENTRUM SILVER PO) Take 1 tablet by mouth daily.     Historical Provider, MD  pantoprazole (PROTONIX) 40 MG tablet TAKE ONE TABLET BY MOUTH ONCE DAILY 08/21/13   Rosalita Chessman, DO  valsartan-hydrochlorothiazide (DIOVAN HCT) 160-12.5 MG per tablet Take 1 tablet by mouth daily. 08/21/13   Yvonne R Lowne, DO   BP 105/42 mmHg  Pulse 97  Temp(Src) 98 F (36.7 C) (Oral)  Resp 30  Ht 5\' 3"  (1.6 m)  Wt 134 lb 7.7 oz (61 kg)  BMI 23.83 kg/m2  SpO2 96% Physical Exam  Constitutional: He is oriented to person, place, and time. He appears well-developed and well-nourished.  Mild respiratory distress. Using accessory muscles for respiration but able to speak in complete sentences.   HENT:  Head: Normocephalic and atraumatic.  Eyes: Pupils are equal, round, and reactive to light. Right eye exhibits no discharge. Left eye exhibits no discharge.  Neck: Normal range of motion. Neck supple. No JVD present.  Cardiovascular: Normal rate, regular rhythm and normal heart sounds.   No murmur heard. Pulmonary/Chest: Effort normal. He has wheezes. He has no rales. He  exhibits no tenderness.  Distant breath sounds. Diffuse expiratory wheezes.   Abdominal: Soft. Bowel sounds are normal. He exhibits no distension and no mass. There is no tenderness.  Musculoskeletal: Normal range of motion. He exhibits no edema.  Lymphadenopathy:    He has no cervical adenopathy.  Neurological: He is alert and oriented to person, place, and time. No cranial nerve deficit. Coordination normal.  Skin: Skin is warm and dry. No rash noted.  Psychiatric: He has a normal mood and affect. His behavior is normal. Thought content normal.  Nursing  note and vitals reviewed.   ED Course  Procedures   DIAGNOSTIC STUDIES: Oxygen Saturation is 94% on non-rebreather, normal by my interpretation.    COORDINATION OF CARE: 11:10 PM Discussed treatment plan with pt at bedside and pt agreed to plan.   Labs Review Results for orders placed or performed during the hospital encounter of 38/75/64  Basic metabolic panel  Result Value Ref Range   Sodium 135 135 - 145 mmol/L   Potassium 3.8 3.5 - 5.1 mmol/L   Chloride 102 96 - 112 mmol/L   CO2 24 19 - 32 mmol/L   Glucose, Bld 183 (H) 70 - 99 mg/dL   BUN 27 (H) 6 - 23 mg/dL   Creatinine, Ser 1.31 0.50 - 1.35 mg/dL   Calcium 9.4 8.4 - 10.5 mg/dL   GFR calc non Af Amer 52 (L) >90 mL/min   GFR calc Af Amer 60 (L) >90 mL/min   Anion gap 9 5 - 15  CBC with Differential  Result Value Ref Range   WBC 8.9 4.0 - 10.5 K/uL   RBC 2.85 (L) 4.22 - 5.81 MIL/uL   Hemoglobin 9.9 (L) 13.0 - 17.0 g/dL   HCT 29.7 (L) 39.0 - 52.0 %   MCV 104.2 (H) 78.0 - 100.0 fL   MCH 34.7 (H) 26.0 - 34.0 pg   MCHC 33.3 30.0 - 36.0 g/dL   RDW 16.2 (H) 11.5 - 15.5 %   Platelets 288 150 - 400 K/uL   Neutrophils Relative % 78 (H) 43 - 77 %   Neutro Abs 6.9 1.7 - 7.7 K/uL   Lymphocytes Relative 10 (L) 12 - 46 %   Lymphs Abs 0.9 0.7 - 4.0 K/uL   Monocytes Relative 7 3 - 12 %   Monocytes Absolute 0.6 0.1 - 1.0 K/uL   Eosinophils Relative 4 0 - 5 %   Eosinophils  Absolute 0.3 0.0 - 0.7 K/uL   Basophils Relative 1 0 - 1 %   Basophils Absolute 0.0 0.0 - 0.1 K/uL  Troponin I  Result Value Ref Range   Troponin I <0.03 <0.031 ng/mL  Brain natriuretic peptide  Result Value Ref Range   B Natriuretic Peptide 75.7 0.0 - 100.0 pg/mL  CBC  Result Value Ref Range   WBC 11.4 (H) 4.0 - 10.5 K/uL   RBC 3.07 (L) 4.22 - 5.81 MIL/uL   Hemoglobin 10.5 (L) 13.0 - 17.0 g/dL   HCT 31.4 (L) 39.0 - 52.0 %   MCV 102.3 (H) 78.0 - 100.0 fL   MCH 34.2 (H) 26.0 - 34.0 pg   MCHC 33.4 30.0 - 36.0 g/dL   RDW 16.3 (H) 11.5 - 15.5 %   Platelets 320 150 - 400 K/uL  TSH  Result Value Ref Range   TSH 1.206 0.350 - 4.500 uIU/mL  Troponin I  Result Value Ref Range   Troponin I <0.03 <0.031 ng/mL  Comprehensive metabolic panel  Result Value Ref Range   Sodium 135 135 - 145 mmol/L   Potassium 4.5 3.5 - 5.1 mmol/L   Chloride 100 96 - 112 mmol/L   CO2 26 19 - 32 mmol/L   Glucose, Bld 158 (H) 70 - 99 mg/dL   BUN 24 (H) 6 - 23 mg/dL   Creatinine, Ser 1.17 0.50 - 1.35 mg/dL   Calcium 9.6 8.4 - 10.5 mg/dL   Total Protein 7.3 6.0 - 8.3 g/dL   Albumin 4.2 3.5 - 5.2 g/dL   AST 26 0 - 37 U/L  ALT 21 0 - 53 U/L   Alkaline Phosphatase 32 (L) 39 - 117 U/L   Total Bilirubin 0.4 0.3 - 1.2 mg/dL   GFR calc non Af Amer 59 (L) >90 mL/min   GFR calc Af Amer 69 (L) >90 mL/min   Anion gap 9 5 - 15    Imaging Review Dg Chest Port 1 View  05/31/2014   CLINICAL DATA:  Shortness of breath since 7 p.m. Patient was here in January for the same thing. History of hypertension. Quit smoking in January.  EXAM: PORTABLE CHEST - 1 VIEW  COMPARISON:  Chest 04/16/2014.  CT chest 04/20/2014.  FINDINGS: Pulmonary hyperinflation. Calcified pleural plaques. Slight fibrosis in the lungs. No focal airspace disease or consolidation. Normal heart size and pulmonary vascularity. Calcification of the aorta. No blunting of costophrenic angles. No pneumothorax.  IMPRESSION: Emphysematous changes in the lungs and  fibrosis. Calcified pleural plaques. No evidence of active pulmonary disease.   Electronically Signed   By: Lucienne Capers M.D.   On: 05/31/2014 23:45     EKG Interpretation   Date/Time:  Thursday May 31 2014 23:07:52 EST Ventricular Rate:  103 PR Interval:  191 QRS Duration: 84 QT Interval:  343 QTC Calculation: 449 R Axis:   74 Text Interpretation:  Sinus tachycardia Borderline ST depression, diffuse  leads When compared with ECG of 12/03/2008, No significant change was found  Confirmed by [facility]  MD, Shemica Meath ([id number]) on 05/31/2014 11:10:22 PM      CRITICAL CARE Performed by: CBSWH,QPRFF Total critical care time: 35 minutes Critical care time was exclusive of separately billable procedures and treating other patients. Critical care was necessary to treat or prevent imminent or life-threatening deterioration. Critical care was time spent personally by me on the following activities: development of treatment plan with patient and/or surrogate as well as nursing, discussions with consultants, evaluation of patient's response to treatment, examination of patient, obtaining history from patient or surrogate, ordering and performing treatments and interventions, ordering and review of laboratory studies, ordering and review of radiographic studies, pulse oximetry and re-evaluation of patient's condition.  MDM   Final diagnoses:  Shortness of breath  COPD exacerbation  Macrocytic anemia    COPD exacerbation. He had received steroids and albuterol with ipratropium and the impotence coming to the ED. He is given a second nebulizer treatment in the ED following which show there is decreased in amount of wheezing but is still dyspneic at rest. He is given a third nebulizer treatment with little additional change. He is still too dyspneic at rest to safely go home. Old records reviewed and he has have prior hospitalizations for COPD exacerbations. Case is discussed with Dr.Khan [facility] who agrees to admit the patient. Of note, patient does have worsening anemia which is now macrocytic and this will need to be investigated.    I personally performed the services described in this documentation, which was scribed in my presence. The recorded information has been reviewed and is accurate.       Delora Fuel, MD 63/84/66 [id number]

## 2014-05-31 NOTE — ED Notes (Signed)
Per EMS, patient is from home with complaints of shortness of breath since tonight at 7pm when he went to lay down to sleep. Patient tripoding on EMS arrival, 88% on room air so nonrebreather was applied. albeuterol 5mg , atrovent 0.5mg  and 125mg  of solumedrol given by ems. 20g present in right forearm, VS with EMS: BP 140/62, P 110, O2 97% while receiving treatment.

## 2014-05-31 NOTE — ED Notes (Signed)
Respiratory at the bedside

## 2014-06-01 ENCOUNTER — Other Ambulatory Visit: Payer: Self-pay

## 2014-06-01 ENCOUNTER — Encounter (HOSPITAL_COMMUNITY): Payer: Self-pay | Admitting: Emergency Medicine

## 2014-06-01 DIAGNOSIS — K5521 Angiodysplasia of colon with hemorrhage: Secondary | ICD-10-CM | POA: Diagnosis present

## 2014-06-01 DIAGNOSIS — J441 Chronic obstructive pulmonary disease with (acute) exacerbation: Principal | ICD-10-CM

## 2014-06-01 DIAGNOSIS — Z7982 Long term (current) use of aspirin: Secondary | ICD-10-CM | POA: Diagnosis not present

## 2014-06-01 DIAGNOSIS — E785 Hyperlipidemia, unspecified: Secondary | ICD-10-CM

## 2014-06-01 DIAGNOSIS — B029 Zoster without complications: Secondary | ICD-10-CM | POA: Diagnosis present

## 2014-06-01 DIAGNOSIS — R0602 Shortness of breath: Secondary | ICD-10-CM | POA: Diagnosis not present

## 2014-06-01 DIAGNOSIS — I10 Essential (primary) hypertension: Secondary | ICD-10-CM | POA: Diagnosis present

## 2014-06-01 DIAGNOSIS — K219 Gastro-esophageal reflux disease without esophagitis: Secondary | ICD-10-CM | POA: Diagnosis not present

## 2014-06-01 DIAGNOSIS — D539 Nutritional anemia, unspecified: Secondary | ICD-10-CM | POA: Diagnosis present

## 2014-06-01 DIAGNOSIS — I739 Peripheral vascular disease, unspecified: Secondary | ICD-10-CM | POA: Diagnosis present

## 2014-06-01 DIAGNOSIS — M1990 Unspecified osteoarthritis, unspecified site: Secondary | ICD-10-CM | POA: Diagnosis present

## 2014-06-01 DIAGNOSIS — Z87891 Personal history of nicotine dependence: Secondary | ICD-10-CM | POA: Diagnosis not present

## 2014-06-01 DIAGNOSIS — I499 Cardiac arrhythmia, unspecified: Secondary | ICD-10-CM | POA: Diagnosis present

## 2014-06-01 LAB — CBC WITH DIFFERENTIAL/PLATELET
Basophils Absolute: 0 10*3/uL (ref 0.0–0.1)
Basophils Relative: 1 % (ref 0–1)
EOS PCT: 4 % (ref 0–5)
Eosinophils Absolute: 0.3 10*3/uL (ref 0.0–0.7)
HCT: 29.7 % — ABNORMAL LOW (ref 39.0–52.0)
Hemoglobin: 9.9 g/dL — ABNORMAL LOW (ref 13.0–17.0)
LYMPHS ABS: 0.9 10*3/uL (ref 0.7–4.0)
LYMPHS PCT: 10 % — AB (ref 12–46)
MCH: 34.7 pg — ABNORMAL HIGH (ref 26.0–34.0)
MCHC: 33.3 g/dL (ref 30.0–36.0)
MCV: 104.2 fL — AB (ref 78.0–100.0)
Monocytes Absolute: 0.6 10*3/uL (ref 0.1–1.0)
Monocytes Relative: 7 % (ref 3–12)
Neutro Abs: 6.9 10*3/uL (ref 1.7–7.7)
Neutrophils Relative %: 78 % — ABNORMAL HIGH (ref 43–77)
Platelets: 288 10*3/uL (ref 150–400)
RBC: 2.85 MIL/uL — ABNORMAL LOW (ref 4.22–5.81)
RDW: 16.2 % — ABNORMAL HIGH (ref 11.5–15.5)
WBC: 8.9 10*3/uL (ref 4.0–10.5)

## 2014-06-01 LAB — BASIC METABOLIC PANEL
ANION GAP: 9 (ref 5–15)
BUN: 27 mg/dL — ABNORMAL HIGH (ref 6–23)
CALCIUM: 9.4 mg/dL (ref 8.4–10.5)
CHLORIDE: 102 mmol/L (ref 96–112)
CO2: 24 mmol/L (ref 19–32)
CREATININE: 1.31 mg/dL (ref 0.50–1.35)
GFR calc non Af Amer: 52 mL/min — ABNORMAL LOW (ref >90)
GFR, EST AFRICAN AMERICAN: 60 mL/min — AB (ref >90)
Glucose, Bld: 183 mg/dL — ABNORMAL HIGH (ref 70–99)
Potassium: 3.8 mmol/L (ref 3.5–5.1)
Sodium: 135 mmol/L (ref 135–145)

## 2014-06-01 LAB — COMPREHENSIVE METABOLIC PANEL
ALBUMIN: 4.2 g/dL (ref 3.5–5.2)
ALT: 21 U/L (ref 0–53)
AST: 26 U/L (ref 0–37)
Alkaline Phosphatase: 32 U/L — ABNORMAL LOW (ref 39–117)
Anion gap: 9 (ref 5–15)
BILIRUBIN TOTAL: 0.4 mg/dL (ref 0.3–1.2)
BUN: 24 mg/dL — AB (ref 6–23)
CALCIUM: 9.6 mg/dL (ref 8.4–10.5)
CHLORIDE: 100 mmol/L (ref 96–112)
CO2: 26 mmol/L (ref 19–32)
Creatinine, Ser: 1.17 mg/dL (ref 0.50–1.35)
GFR, EST AFRICAN AMERICAN: 69 mL/min — AB (ref >90)
GFR, EST NON AFRICAN AMERICAN: 59 mL/min — AB (ref >90)
Glucose, Bld: 158 mg/dL — ABNORMAL HIGH (ref 70–99)
Potassium: 4.5 mmol/L (ref 3.5–5.1)
Sodium: 135 mmol/L (ref 135–145)
TOTAL PROTEIN: 7.3 g/dL (ref 6.0–8.3)

## 2014-06-01 LAB — CBC
HCT: 31.4 % — ABNORMAL LOW (ref 39.0–52.0)
Hemoglobin: 10.5 g/dL — ABNORMAL LOW (ref 13.0–17.0)
MCH: 34.2 pg — AB (ref 26.0–34.0)
MCHC: 33.4 g/dL (ref 30.0–36.0)
MCV: 102.3 fL — ABNORMAL HIGH (ref 78.0–100.0)
PLATELETS: 320 10*3/uL (ref 150–400)
RBC: 3.07 MIL/uL — ABNORMAL LOW (ref 4.22–5.81)
RDW: 16.3 % — AB (ref 11.5–15.5)
WBC: 11.4 10*3/uL — AB (ref 4.0–10.5)

## 2014-06-01 LAB — TROPONIN I: TROPONIN I: 0.06 ng/mL — AB (ref <0.031)

## 2014-06-01 LAB — TSH: TSH: 1.206 u[IU]/mL (ref 0.350–4.500)

## 2014-06-01 LAB — IRON AND TIBC
IRON: 136 ug/dL — AB (ref 42–165)
Saturation Ratios: 25 % (ref 20–55)
TIBC: 554 ug/dL — AB (ref 215–435)
UIBC: 418 ug/dL — AB (ref 125–400)

## 2014-06-01 LAB — BRAIN NATRIURETIC PEPTIDE: B NATRIURETIC PEPTIDE 5: 75.7 pg/mL (ref 0.0–100.0)

## 2014-06-01 LAB — FERRITIN: FERRITIN: 40 ng/mL (ref 22–322)

## 2014-06-01 MED ORDER — ONDANSETRON HCL 4 MG/2ML IJ SOLN: 4.0000 mg | Freq: Four times a day (QID) | INTRAMUSCULAR | Status: DC | PRN

## 2014-06-01 MED ORDER — ASPIRIN EC 325 MG PO TBEC
325.0000 mg | DELAYED_RELEASE_TABLET | Freq: Every day | ORAL | Status: DC
  Administered 2014-06-01 – 2014-06-02 (×2): 325 mg via ORAL
  Filled 2014-06-01 (×2): qty 1

## 2014-06-01 MED ORDER — SODIUM CHLORIDE 0.9 % IJ SOLN
3.0000 mL | Freq: Two times a day (BID) | INTRAMUSCULAR | Status: DC
Start: 2014-06-01 — End: 2014-06-01

## 2014-06-01 MED ORDER — ASPIRIN 81 MG PO TBEC: 81.0000 mg | DELAYED_RELEASE_TABLET | Freq: Every day | ORAL | Status: DC

## 2014-06-01 MED ORDER — ALBUTEROL SULFATE (2.5 MG/3ML) 0.083% IN NEBU
2.5000 mg | INHALATION_SOLUTION | RESPIRATORY_TRACT | Status: DC | PRN
Start: 2014-06-01 — End: 2014-06-02

## 2014-06-01 MED ORDER — HEPARIN SODIUM (PORCINE) 5000 UNIT/ML IJ SOLN
5000.0000 [IU] | Freq: Three times a day (TID) | INTRAMUSCULAR | Status: DC
  Administered 2014-06-01 – 2014-06-02 (×4): 5000 [IU] via SUBCUTANEOUS
  Filled 2014-06-01 (×5): qty 1

## 2014-06-01 MED ORDER — DEXTROSE 5 % IV SOLN
250.0000 mg | INTRAVENOUS | Status: DC
  Administered 2014-06-01 – 2014-06-02 (×2): 250 mg via INTRAVENOUS
  Filled 2014-06-01 (×2): qty 250

## 2014-06-01 MED ORDER — METHYLPREDNISOLONE SODIUM SUCC 125 MG IJ SOLR
60.0000 mg | Freq: Four times a day (QID) | INTRAMUSCULAR | Status: DC
  Administered 2014-06-01 – 2014-06-02 (×5): 60 mg via INTRAVENOUS
  Filled 2014-06-01 (×2): qty 0.96
  Filled 2014-06-01: qty 2
  Filled 2014-06-01 (×3): qty 0.96
  Filled 2014-06-01: qty 2
  Filled 2014-06-01: qty 0.96

## 2014-06-01 MED ORDER — IPRATROPIUM BROMIDE 0.02 % IN SOLN
RESPIRATORY_TRACT | Status: AC
  Filled 2014-06-01: qty 2.5

## 2014-06-01 MED ORDER — AMLODIPINE BESYLATE 10 MG PO TABS
10.0000 mg | ORAL_TABLET | Freq: Every day | ORAL | Status: DC
  Administered 2014-06-01 – 2014-06-02 (×2): 10 mg via ORAL
  Filled 2014-06-01 (×2): qty 1

## 2014-06-01 MED ORDER — VITAMIN C 500 MG PO TABS
500.0000 mg | ORAL_TABLET | Freq: Every day | ORAL | Status: DC
  Administered 2014-06-01 – 2014-06-02 (×2): 500 mg via ORAL
  Filled 2014-06-01 (×2): qty 1

## 2014-06-01 MED ORDER — GUAIFENESIN ER 600 MG PO TB12
600.0000 mg | ORAL_TABLET | Freq: Two times a day (BID) | ORAL | Status: DC
  Administered 2014-06-01 – 2014-06-02 (×3): 600 mg via ORAL
  Filled 2014-06-01 (×4): qty 1

## 2014-06-01 MED ORDER — ADULT MULTIVITAMIN W/MINERALS CH: 1.0000 | ORAL_TABLET | Freq: Every day | ORAL | Status: DC

## 2014-06-01 MED ORDER — DOCUSATE SODIUM 100 MG PO CAPS
100.0000 mg | ORAL_CAPSULE | Freq: Two times a day (BID) | ORAL | Status: DC
  Administered 2014-06-01 – 2014-06-02 (×3): 100 mg via ORAL
  Filled 2014-06-01 (×4): qty 1

## 2014-06-01 MED ORDER — CEFTRIAXONE SODIUM IN DEXTROSE 20 MG/ML IV SOLN
1.0000 g | INTRAVENOUS | Status: DC
  Administered 2014-06-01 – 2014-06-02 (×2): 1 g via INTRAVENOUS
  Filled 2014-06-01 (×2): qty 50

## 2014-06-01 MED ORDER — SODIUM CHLORIDE 0.9 % IJ SOLN: 3.0000 mL | INTRAMUSCULAR | Status: DC | PRN

## 2014-06-01 MED ORDER — IRBESARTAN 150 MG PO TABS
150.0000 mg | ORAL_TABLET | Freq: Every day | ORAL | Status: DC
  Administered 2014-06-01 – 2014-06-02 (×2): 150 mg via ORAL
  Filled 2014-06-01 (×2): qty 1

## 2014-06-01 MED ORDER — ONDANSETRON HCL 4 MG PO TABS: 4.0000 mg | ORAL_TABLET | Freq: Four times a day (QID) | ORAL | Status: DC | PRN

## 2014-06-01 MED ORDER — IPRATROPIUM-ALBUTEROL 0.5-2.5 (3) MG/3ML IN SOLN
RESPIRATORY_TRACT | Status: AC
  Administered 2014-06-01: 3 mL
  Filled 2014-06-01: qty 3

## 2014-06-01 MED ORDER — IPRATROPIUM BROMIDE 0.02 % IN SOLN: 0.5000 mg | Freq: Four times a day (QID) | RESPIRATORY_TRACT | Status: DC

## 2014-06-01 MED ORDER — FOLIC ACID 1 MG PO TABS
1.0000 mg | ORAL_TABLET | Freq: Every day | ORAL | Status: DC
  Administered 2014-06-01 – 2014-06-02 (×2): 1 mg via ORAL
  Filled 2014-06-01 (×2): qty 1

## 2014-06-01 MED ORDER — ACETAMINOPHEN 325 MG PO TABS: 650.0000 mg | ORAL_TABLET | Freq: Four times a day (QID) | ORAL | Status: DC | PRN

## 2014-06-01 MED ORDER — ACETAMINOPHEN 650 MG RE SUPP: 650.0000 mg | Freq: Four times a day (QID) | RECTAL | Status: DC | PRN

## 2014-06-01 MED ORDER — ALBUTEROL SULFATE (2.5 MG/3ML) 0.083% IN NEBU: 2.5000 mg | INHALATION_SOLUTION | Freq: Four times a day (QID) | RESPIRATORY_TRACT | Status: DC

## 2014-06-01 MED ORDER — IPRATROPIUM-ALBUTEROL 0.5-2.5 (3) MG/3ML IN SOLN: 3.0000 mL | Freq: Four times a day (QID) | RESPIRATORY_TRACT | Status: DC

## 2014-06-01 MED ORDER — PANTOPRAZOLE SODIUM 40 MG PO TBEC
40.0000 mg | DELAYED_RELEASE_TABLET | Freq: Every day | ORAL | Status: DC
  Administered 2014-06-01 – 2014-06-02 (×2): 40 mg via ORAL
  Filled 2014-06-01 (×2): qty 1

## 2014-06-01 MED ORDER — IPRATROPIUM-ALBUTEROL 0.5-2.5 (3) MG/3ML IN SOLN
3.0000 mL | Freq: Four times a day (QID) | RESPIRATORY_TRACT | Status: DC
  Administered 2014-06-01 – 2014-06-02 (×5): 3 mL via RESPIRATORY_TRACT
  Filled 2014-06-01 (×4): qty 3

## 2014-06-01 MED ORDER — VITAMIN B-1 100 MG PO TABS
100.0000 mg | ORAL_TABLET | Freq: Every day | ORAL | Status: DC
  Administered 2014-06-01 – 2014-06-02 (×2): 100 mg via ORAL
  Filled 2014-06-01 (×2): qty 1

## 2014-06-01 MED ORDER — SODIUM CHLORIDE 0.9 % IJ SOLN
3.0000 mL | Freq: Two times a day (BID) | INTRAMUSCULAR | Status: DC
  Administered 2014-06-01 – 2014-06-02 (×4): 3 mL via INTRAVENOUS

## 2014-06-01 MED ORDER — FLUTICASONE FUROATE-VILANTEROL 100-25 MCG/INH IN AEPB: 1.0000 | INHALATION_SPRAY | Freq: Every day | RESPIRATORY_TRACT | Status: DC

## 2014-06-01 MED ORDER — FERROUS SULFATE 325 (65 FE) MG PO TABS
325.0000 mg | ORAL_TABLET | Freq: Two times a day (BID) | ORAL | Status: DC
  Administered 2014-06-01 – 2014-06-02 (×4): 325 mg via ORAL
  Filled 2014-06-01 (×5): qty 1

## 2014-06-01 MED ORDER — ATORVASTATIN CALCIUM 40 MG PO TABS
40.0000 mg | ORAL_TABLET | Freq: Every day | ORAL | Status: DC
  Administered 2014-06-01 – 2014-06-02 (×2): 40 mg via ORAL
  Filled 2014-06-01 (×2): qty 1

## 2014-06-01 MED ORDER — SODIUM CHLORIDE 0.9 % IV SOLN: 250.0000 mL | INTRAVENOUS | Status: DC | PRN

## 2014-06-01 MED ORDER — VALSARTAN-HYDROCHLOROTHIAZIDE 160-12.5 MG PO TABS: 1.0000 | ORAL_TABLET | Freq: Every day | ORAL | Status: DC

## 2014-06-01 MED ORDER — FENOFIBRATE 160 MG PO TABS
160.0000 mg | ORAL_TABLET | Freq: Every day | ORAL | Status: DC
  Administered 2014-06-01 – 2014-06-02 (×2): 160 mg via ORAL
  Filled 2014-06-01 (×2): qty 1

## 2014-06-01 MED ORDER — HYDROCHLOROTHIAZIDE 12.5 MG PO CAPS
12.5000 mg | ORAL_CAPSULE | Freq: Every day | ORAL | Status: DC
  Administered 2014-06-01 – 2014-06-02 (×2): 12.5 mg via ORAL
  Filled 2014-06-01 (×2): qty 1

## 2014-06-01 MED ORDER — ADULT MULTIVITAMIN W/MINERALS CH
1.0000 | ORAL_TABLET | Freq: Every day | ORAL | Status: DC
  Administered 2014-06-01 – 2014-06-02 (×2): 1 via ORAL
  Filled 2014-06-01 (×2): qty 1

## 2014-06-01 NOTE — Progress Notes (Signed)
Patient arrived to unit from ED via stretcher. Patient alert, oriented and ambulatory with stand by assistance. Admission weight, vitals and assessment completed. Patient oriented to unit, fall and safety plan reviewed. Patient currently resting comfortably, call light within reach. Will continue to monitor. Blood pressure 114/57, pulse 98, temperature 97.5 F (36.4 C), temperature source Oral, resp. rate 22, height 5\' 3"  (1.6 m), weight 61.19 kg (134 lb 14.4 oz), SpO2 95 %. Tresa Endo

## 2014-06-01 NOTE — Progress Notes (Signed)
Pt seen and examined, pls see h&P this am for details 73/M admitted with COPD exacerbation Improving, continue Abx, solumedrol and nebs  Domenic Polite, MD [phone]

## 2014-06-01 NOTE — Care Management Note (Unsigned)
    Page 1 of 1   06/01/2014     4:32:55 PM CARE MANAGEMENT NOTE 06/01/2014  Patient:  GAD, AYMOND   Account Number:  [id number]  Date Initiated:  06/01/2014  Documentation initiated by:  Estreya Clay  Subjective/Objective Assessment:   Pt adm on 05/31/14 with COPD exacerbation.  PTA, pt resides at home with spouse.     Action/Plan:   Will follow for dc needs as pt progresses.   Anticipated DC Date:  06/04/2014   Anticipated DC Plan:  [place]  CM consult      Choice offered to / List presented to:             Status of service:  In process, will continue to follow Medicare Important Message given?   (If response is "NO", the following Medicare IM given date fields will be blank) Date Medicare IM given:   Medicare IM given by:   Date Additional Medicare IM given:   Additional Medicare IM given by:    Discharge Disposition:    Per UR Regulation:  Reviewed for med. necessity/level of care/duration of stay  If discussed at [place] of Stay Meetings, dates discussed:    Comments:

## 2014-06-01 NOTE — Consult Note (Signed)
Met with the patient and his wife earlier today to discuss the benefits of [facility] Care Management disease and care management program for chronic diseases.  Patient and wife declined services at this time but,  wanted a brochure and contact information and this was given.  For questions, please contact Natividad Brood, RN, BSN, [facility] Liaison at [phone].

## 2014-06-01 NOTE — Progress Notes (Signed)
74yo male c/o SOB since 1900 while lying down, to begin IV ABX for COPD.  Will start Rocephin 1g IV Q24H and monitor CBC and Cx.  Wynona Neat, PharmD, BCPS 06/01/2014 3:53 AM

## 2014-06-01 NOTE — H&P (Signed)
[facility] History and Physical  Ronald Bell ZOX:[id number] DOB: October 18, 1940 DOA: 05/31/2014  Referring physician: Delora Fuel, MD PCP: [name], DO   Chief Complaint: Shortness of Breath  HPI: Ronald Bell is a 74 y.o. male presents with increased shortness of breath. Patient states that yesterday he was out in his yard doing raking and little bit of yard work. He staets this morning he woke up and has been having difficulty with his breathing. He has cough and little sputum production. He has no chest pain or tightness. He states he has needed his inhalers more frequently. He states he is not on home oxygen. He states he quit smoking in January. In the ED he was noted to have increased WOB and also was noted to have saturations of 88%. In addition he states he was admitted about 3 months ago for the same issues.   Review of Systems:  Constitutional:  No weight loss, night sweats, Fevers, chills, +fatigue.  HEENT:  No headaches, No sneezing, itching, ear ache, nasal congestion, post nasal drip,  Cardio-vascular:  No chest pain, Orthopnea, PND, swelling in lower extremities  GI:  No heartburn, indigestion, abdominal pain, nausea, vomiting, diarrhea  Resp:  ++shortness of breath with exertion and at rest. +non-productive cough, No coughing up of blood.+wheezing  Skin:  no rash or lesions.  GU:  no dysuria, change in color of urine, no urgency or frequency Musculoskeletal:  No joint pain or swelling. No decreased range of motion Psych:  No change in mood or affect. No depression or anxiety  Past Medical History  Diagnosis Date  . Other symptoms involving cardiovascular system   . Polyp of nasal cavity   . Other specified disorders of arteries and arterioles   . Cervicalgia   . Routine general medical examination at a health care facility   . Rosacea   . Benign neoplasm of colon   . Personal history of tobacco use, presenting hazards to health   .  Personal history of other diseases of digestive system   . Psychosexual dysfunction with inhibited sexual excitement   . Chronic airway obstruction, not elsewhere classified   . Unspecified essential hypertension   . Other and unspecified hyperlipidemia   . GERD (gastroesophageal reflux disease)   . Anemia   . Arthritis   . Blood transfusion 2010    4 units  . Adenomatous colon polyp   . Peripheral vascular disease   . Irregular heartbeat   . Shingles   . Carotid artery occlusion   . Angiodysplasia of intestine with hemorrhage    Past Surgical History  Procedure Laterality Date  . Colon surgery    . Iliac artery stent Left 2005    CIA  . Esophagogastroduodenoscopy  2012    normal  . Colonoscopy  July 2015    Dr. Henrene Pastor  . Tonsillectomy    . Cardiac catheterization  2001  . Knee arthroscopy with medial menisectomy Left 03/08/2014    Procedure: LEFT KNEE SCOPE WITH MEDIAL MENISECTOMY AND CHONDROPLASTY;  Surgeon: Ninetta Lights, MD;  Location: [facility];  Service: Orthopedics;  Laterality: Left;   Social History:  reports that he quit smoking about 6 weeks ago. His smoking use included Cigarettes and E-cigarettes. He has a 41.25 pack-year smoking history. He has never used smokeless tobacco. He reports that he drinks about 2.4 oz of alcohol per week. He reports that he does not use illicit drugs.  No Known Allergies  Family History  Problem Relation Age of Onset  . Lung disease Mother     pulm fibrosis  . Uterine cancer    . Colitis Father   . Colon cancer Neg Hx   . Heart disease Brother   . Hypertension Brother   . Hyperlipidemia Brother   . Heart disease Daughter     cad  . Hypertension Son      Prior to Admission medications   Medication Sig Start Date End Date Taking? Authorizing Provider  amLODipine (NORVASC) 10 MG tablet TAKE 1 TABLET (10 MG TOTAL) BY MOUTH DAILY. 08/21/13  Yes Rosalita Chessman, DO  Ascorbic Acid (VITAMIN C) 250 MG tablet Take 500 mg by mouth daily.     Yes Historical Provider, MD  aspirin 81 MG EC tablet Take 81 mg by mouth daily.     Yes Historical Provider, MD  atorvastatin (LIPITOR) 40 MG tablet Take 1 tablet (40 mg total) by mouth daily. 08/21/13  Yes Alferd Apa Lowne, DO  docusate sodium (COLACE) 100 MG capsule Take 100 mg by mouth 2 (two) times daily.   Yes Historical Provider, MD  fenofibrate 160 MG tablet Take 1 tablet (160 mg total) by mouth daily. 03/13/14  Yes Rosalita Chessman, DO  Ferrous Sulfate (IRON) 325 (65 FE) MG TABS Take 1 tablet by mouth 2 (two) times daily. 07/25/10  Yes Yvonne R Lowne, DO  Fluticasone Furoate-Vilanterol (BREO ELLIPTA) 100-25 MCG/INH AEPB Inhale 1 puff into the lungs daily. 12/01/13  Yes Tanda Rockers, MD  Ipratropium-Albuterol (COMBIVENT RESPIMAT) 20-100 MCG/ACT AERS respimat Inhale 1 puff into the lungs 4 (four) times daily as needed for wheezing. 05/10/14  Yes Rosalita Chessman, DO  Multiple Vitamin (MULTIVITAMIN WITH MINERALS) TABS tablet Take 1 tablet by mouth daily.   Yes Historical Provider, MD  pantoprazole (PROTONIX) 40 MG tablet TAKE ONE TABLET BY MOUTH ONCE DAILY 08/21/13  Yes Yvonne R Lowne, DO  valsartan-hydrochlorothiazide (DIOVAN HCT) 160-12.5 MG per tablet Take 1 tablet by mouth daily. 08/21/13  Yes Yvonne R Lowne, DO  VOLTAREN 1 % GEL Apply 2 g topically 4 (four) times daily as needed (for arthritis).  05/12/14  Yes Historical Provider, MD  DOCUSATE SODIUM PO Take 1 tablet by mouth 2 (two) times daily.     Historical Provider, MD  Multiple Vitamins-Minerals (CENTRUM SILVER PO) Take 1 tablet by mouth daily.     Historical Provider, MD   Physical Exam: Filed Vitals:   05/31/14 2330 06/01/14 0003 06/01/14 0038 06/01/14 0048  BP: 113/36  104/43 105/42  Pulse: 98  104 97  Temp:      TempSrc:      Resp: 22  24 30   Height:      Weight:  61 kg (134 lb 7.7 oz)    SpO2: 100%  97% 96%    Wt Readings from Last 3 Encounters:  06/01/14 61 kg (134 lb 7.7 oz)  05/10/14 61.236 kg (135 lb)  04/25/14 59.421 kg  (131 lb)    General:  Appears calm and comfortable Eyes: PERRL, normal lids, irises & conjunctiva ENT: grossly normal hearing, lips & tongue Neck: no LAD, masses or thyromegaly Cardiovascular: RRR, no m/r/g. No LE edema. Respiratory: Normal respiratory effort. diminished with some ronchi noted Abdomen: soft, ntnd Skin: no rash or induration seen on limited exam Musculoskeletal: grossly normal tone BUE/BLE Psychiatric: grossly normal mood and affect Neurologic: grossly non-focal.          Labs on Admission:  Basic Metabolic Panel: No  results for input(s): NA, K, CL, CO2, GLUCOSE, BUN, CREATININE, CALCIUM, MG, PHOS in the last 168 hours. Liver Function Tests: No results for input(s): AST, ALT, ALKPHOS, BILITOT, PROT, ALBUMIN in the last 168 hours. No results for input(s): LIPASE, AMYLASE in the last 168 hours. No results for input(s): AMMONIA in the last 168 hours. CBC:  Recent Labs Lab 05/31/14 2351  WBC 8.9  NEUTROABS 6.9  HGB 9.9*  HCT 29.7*  MCV 104.2*  PLT 288   Cardiac Enzymes: No results for input(s): CKTOTAL, CKMB, CKMBINDEX, TROPONINI in the last 168 hours.  BNP (last 3 results) No results for input(s): BNP in the last 8760 hours.  ProBNP (last 3 results) No results for input(s): PROBNP in the last 8760 hours.  CBG: No results for input(s): GLUCAP in the last 168 hours.  Radiological Exams on Admission: Dg Chest Port 1 View  05/31/2014   CLINICAL DATA:  Shortness of breath since 7 p.m. Patient was here in January for the same thing. History of hypertension. Quit smoking in January.  EXAM: PORTABLE CHEST - 1 VIEW  COMPARISON:  Chest 04/16/2014.  CT chest 04/20/2014.  FINDINGS: Pulmonary hyperinflation. Calcified pleural plaques. Slight fibrosis in the lungs. No focal airspace disease or consolidation. Normal heart size and pulmonary vascularity. Calcification of the aorta. No blunting of costophrenic angles. No pneumothorax.  IMPRESSION: Emphysematous changes  in the lungs and fibrosis. Calcified pleural plaques. No evidence of active pulmonary disease.   Electronically Signed   By: Lucienne Capers M.D.   On: 05/31/2014 23:45      Assessment/Plan Active Problems:   Hyperlipidemia   GERD   COPD exacerbation   1. COPD exacerbation -aggressive bronchodilators -will start on solumedrol IV Q6 -also started on rocephin and azithromycin for possible bronchitis -consider adding theophylline if not improved -will start on oxygen therapy and assess prior to discharge  2. GERD -continue with PPI  3. Hyperlipidemia -will continue with statins  4. Anemia -check iron studies     Code Status: Full Code (must indicate code status--if unknown or must be presumed, indicate so) DVT Prophylaxis:heparin Family Communication: None (indicate person spoken with, if applicable, with phone number if by telephone) Disposition Plan: Home (indicate anticipated LOS)  Time spent: 9min  Parrish Bonn A [facility] Pager [phone]

## 2014-06-01 NOTE — ED Notes (Signed)
hospitalist at the bedside 

## 2014-06-02 DIAGNOSIS — R0602 Shortness of breath: Secondary | ICD-10-CM | POA: Insufficient documentation

## 2014-06-02 LAB — HEMOGLOBIN A1C
Hgb A1c MFr Bld: 5.3 % (ref 4.8–5.6)
MEAN PLASMA GLUCOSE: 105 mg/dL

## 2014-06-02 LAB — GLUCOSE, CAPILLARY
GLUCOSE-CAPILLARY: 140 mg/dL — AB (ref 70–99)
GLUCOSE-CAPILLARY: 158 mg/dL — AB (ref 70–99)

## 2014-06-02 MED ORDER — LEVOFLOXACIN 500 MG PO TABS: 500.0000 mg | ORAL_TABLET | Freq: Every day | ORAL | Status: DC

## 2014-06-02 MED ORDER — GUAIFENESIN ER 600 MG PO TB12: 600.0000 mg | ORAL_TABLET | Freq: Two times a day (BID) | ORAL | Status: DC | PRN

## 2014-06-02 MED ORDER — PREDNISONE 20 MG PO TABS: 20.0000 mg | ORAL_TABLET | Freq: Every day | ORAL | Status: DC

## 2014-06-02 NOTE — Progress Notes (Signed)
Patient alert and oriented, denies pain, dizziness or shortness of breath. V/S stable, void in the urinal. Iv and tele d/c. D/C instruction given patient verbalized understanding. Patient d/c home per order.

## 2014-06-02 NOTE — Discharge Summary (Signed)
Physician Discharge Summary  Ronald Bell EPP:[id number] DOB: 01/19/1941 DOA: 05/31/2014  PCP: [name], DO  Admit date: 05/31/2014 Discharge date: 06/02/2014  Time spent: 45 minutes  Recommendations for Outpatient Follow-up:  1. Dr.Wert in 1-2weeks 2. PCP Dr.Lowne in 1 week, needs outpatient workup for this  Discharge Diagnoses:    COPD exacerbation   COPD   Hyperlipidemia   GERD   Hypertension   Anemia   Discharge Condition: stable  Diet recommendation: heart healthy  Filed Weights   05/31/14 2310 06/01/14 0003 06/01/14 0337  Weight: 61.236 kg (135 lb) 61 kg (134 lb 7.7 oz) 61.19 kg (134 lb 14.4 oz)    History of present illness:  73/M with COPD presented to the ER with cough, congestion, wheezing and shortness of breath x1day. He quit smoking in january  Hospital Course:  1. COPD exacerbation -improved, continue bronchodilators -treated with IV rocephin and azithromycin, solumedrol. -much improved, weaned off O2. -discharged home on prednisone taper and levaquin  2. GERD -continue with PPI  3. Hyperlipidemia -continue with statin  4. Anemia -panel consistent with chronic disease   Discharge Exam: Filed Vitals:   06/02/14 1026  BP: 123/42  Pulse: 87  Temp: 97.8 F (36.6 C)  Resp:     General: AAOx3 Cardiovascular: S1S2/RRR Respiratory: improved air movement  Discharge Instructions   Discharge Instructions    Diet - low sodium heart healthy    Complete by:  As directed      Increase activity slowly    Complete by:  As directed           Current Discharge Medication List    START taking these medications   Details  guaiFENesin (MUCINEX) 600 MG 12 hr tablet Take 1 tablet (600 mg total) by mouth 2 (two) times daily as needed for cough or to loosen phlegm. Qty: 10 tablet, Refills: 0    levofloxacin (LEVAQUIN) 500 MG tablet Take 1 tablet (500 mg total) by mouth daily. For 4days Qty: 4 tablet, Refills: 0    predniSONE  (DELTASONE) 20 MG tablet Take 1-2 tablets (20-40 mg total) by mouth daily with breakfast. Take 40mg  for 2days then 20mg  for 2days then STOP Qty: 6 tablet, Refills: 0      CONTINUE these medications which have NOT CHANGED   Details  amLODipine (NORVASC) 10 MG tablet TAKE 1 TABLET (10 MG TOTAL) BY MOUTH DAILY. Qty: 90 tablet, Refills: 3   Associated Diagnoses: HTN (hypertension)    Ascorbic Acid (VITAMIN C) 250 MG tablet Take 500 mg by mouth daily.     aspirin 81 MG EC tablet Take 81 mg by mouth daily.      atorvastatin (LIPITOR) 40 MG tablet Take 1 tablet (40 mg total) by mouth daily. Qty: 90 tablet, Refills: 3   Associated Diagnoses: Other and unspecified hyperlipidemia    docusate sodium (COLACE) 100 MG capsule Take 100 mg by mouth 2 (two) times daily.    fenofibrate 160 MG tablet Take 1 tablet (160 mg total) by mouth daily. Qty: 30 tablet, Refills: 2    Ferrous Sulfate (IRON) 325 (65 FE) MG TABS Take 1 tablet by mouth 2 (two) times daily. Qty: 90 each, Refills: 3   Associated Diagnoses: Anemia    Fluticasone Furoate-Vilanterol (BREO ELLIPTA) 100-25 MCG/INH AEPB Inhale 1 puff into the lungs daily. Qty: 3 each, Refills: 3    Ipratropium-Albuterol (COMBIVENT RESPIMAT) 20-100 MCG/ACT AERS respimat Inhale 1 puff into the lungs 4 (four) times daily as needed for  wheezing. Qty: 1 Inhaler, Refills: 3   Associated Diagnoses: Chronic obstructive pulmonary disease, unspecified COPD, unspecified chronic bronchitis type    !! Multiple Vitamin (MULTIVITAMIN WITH MINERALS) TABS tablet Take 1 tablet by mouth daily.    pantoprazole (PROTONIX) 40 MG tablet TAKE ONE TABLET BY MOUTH ONCE DAILY Qty: 90 tablet, Refills: 3   Associated Diagnoses: GERD (gastroesophageal reflux disease)    valsartan-hydrochlorothiazide (DIOVAN HCT) 160-12.5 MG per tablet Take 1 tablet by mouth daily. Qty: 90 tablet, Refills: 3   Associated Diagnoses: HTN (hypertension)    VOLTAREN 1 % GEL Apply 2 g topically 4  (four) times daily as needed (for arthritis).     !! Multiple Vitamins-Minerals (CENTRUM SILVER PO) Take 1 tablet by mouth daily.      !! - Potential duplicate medications found. Please discuss with provider.     No Known Allergies Follow-up Information    Follow up with Christinia Gully, MD. Schedule an appointment as soon as possible for a visit in 10 days.   Specialty:  Pulmonary Disease   Contact information:   [address] [place] [address] [phone]        The results of significant diagnostics from this hospitalization (including imaging, microbiology, ancillary and laboratory) are listed below for reference.    Significant Diagnostic Studies: Dg Chest Port 1 View  05/31/2014   CLINICAL DATA:  Shortness of breath since 7 p.m. Patient was here in January for the same thing. History of hypertension. Quit smoking in January.  EXAM: PORTABLE CHEST - 1 VIEW  COMPARISON:  Chest 04/16/2014.  CT chest 04/20/2014.  FINDINGS: Pulmonary hyperinflation. Calcified pleural plaques. Slight fibrosis in the lungs. No focal airspace disease or consolidation. Normal heart size and pulmonary vascularity. Calcification of the aorta. No blunting of costophrenic angles. No pneumothorax.  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 Calcified pleural plaques. No evidence of active pulmonary disease.   Electronically Signed   By: Lucienne Capers M.D.   On: 05/31/2014 23:45    Microbiology: No results found for this or any previous visit (from the past 240 hour(s)).   Labs: Basic Metabolic Panel:  Recent Labs Lab 05/31/14 2351 06/01/14 0402  NA 135 135  K 3.8 4.5  CL 102 100  CO2 24 26  GLUCOSE 183* 158*  BUN 27* 24*  CREATININE 1.31 1.17  CALCIUM 9.4 9.6   Liver Function Tests:  Recent Labs Lab 06/01/14 0402  AST 26  ALT 21  ALKPHOS 32*  BILITOT 0.4  PROT 7.3  ALBUMIN 4.2   No results for input(s): LIPASE, AMYLASE in the last 168 hours. No results for  input(s): AMMONIA in the last 168 hours. CBC:  Recent Labs Lab 05/31/14 2351 06/01/14 0402  WBC 8.9 11.4*  NEUTROABS 6.9  --   HGB 9.9* 10.5*  HCT 29.7* 31.4*  MCV 104.2* 102.3*  PLT 288 320   Cardiac Enzymes:  Recent Labs Lab 05/31/14 2351 06/01/14 0402 06/01/14 0929 06/01/14 1449  TROPONINI <0.03 <0.03 <0.03 0.06*   BNP: BNP (last 3 results)  Recent Labs  05/31/14 2351  BNP 75.7    ProBNP (last 3 results) No results for input(s): PROBNP in the last 8760 hours.  CBG:  Recent Labs Lab 06/02/14 0619 06/02/14 1139  GLUCAP 140* 158*       Signed:  Kayia Billinger  [facility] 06/02/2014, 1:54 PM

## 2014-06-04 ENCOUNTER — Encounter: Payer: Medicare Other | Admitting: Thoracic Surgery (Cardiothoracic Vascular Surgery)

## 2014-06-04 ENCOUNTER — Ambulatory Visit (INDEPENDENT_AMBULATORY_CARE_PROVIDER_SITE_OTHER): Payer: Medicare Other | Admitting: Internal Medicine

## 2014-06-04 ENCOUNTER — Telehealth: Payer: Self-pay | Admitting: Internal Medicine

## 2014-06-04 ENCOUNTER — Encounter: Payer: Self-pay | Admitting: Internal Medicine

## 2014-06-04 VITALS — BP 136/60 | HR 103 | Ht 63.0 in | Wt 136.8 lb

## 2014-06-04 DIAGNOSIS — I350 Nonrheumatic aortic (valve) stenosis: Secondary | ICD-10-CM

## 2014-06-04 DIAGNOSIS — J920 Pleural plaque with presence of asbestos: Secondary | ICD-10-CM | POA: Diagnosis not present

## 2014-06-04 DIAGNOSIS — J449 Chronic obstructive pulmonary disease, unspecified: Secondary | ICD-10-CM | POA: Diagnosis not present

## 2014-06-04 MED ORDER — FAMOTIDINE 20 MG PO TABS: ORAL_TABLET | ORAL | Status: DC

## 2014-06-04 MED ORDER — PREDNISONE 10 MG PO TABS: ORAL_TABLET | ORAL | Status: DC

## 2014-06-04 NOTE — Telephone Encounter (Signed)
Pt seen in [facility] 3/10-3/12 for increased SOB Pt notes since d/c he has been having increased SOB.  Using albuterol PRN Pt reports SOB is worse at night when he tried to lay down. Denies chest tightness and pain.   Pt requesting OV today.  Offered appt with Tammy Parrett - pt refused, requested to see MW only.  Scheduled OV with MW today at 2:45pm Nothing further needed.

## 2014-06-04 NOTE — Progress Notes (Signed)
Subjective:    Patient ID: Ronald Bell, male    DOB: [birth year]  MRN: [id number]  Brief patient profile:  58 yowm smoker from [place] with variable cough starting Fall on Nov 2014 referred 03/27/2013 by Dr Etter Sjogren for abn CT c/w asbestosis with GOLD III copd with min  reversibility by pfts 04/28/13 and ? acei cough   History of Present Illness  03/27/2013 1st [facility] office visit/ [name] still active smoker cc recurrent episodes of severe refractory cough this episode started x 3 weeks prior to OV   but comes and goes since early fall 2014 assoc with doe x  walking garbage to street x 3 years better up to several hours after combivent on tudorza bid miant as well.  Cough is variably prod of thick yellow mucus but min amt, mostly prod in am and dry rest of the day.  Has noct choking spells just about every night p lies down better if sits up and takes combivent - they feel almost  like something strangling him.  rec Stop lisinopril Benicar 20/12.5 one daily  Pantoprazole (protonix) 40 mg   Take 30-60 min before first meal of the day and Pepcid ac 20 mg one bedtime until return to office   The key is to stop smoking completely before smoking completely stops you!    04/04/2013  Acute  ov/Makynna Manocchio re: refractory cough  Chief Complaint  Patient presents with  . Acute Visit    Pt c/o increased cough since last visit. Cough is non prod and worse at night and early am. He states that he gets SOB with coughing spells and is using combivent 5 times daily. He states had episode of syncope last night and EMS was called.    really not sob unless coughing. No excess mucus, present 24/7  rec Stop tudorza Prednisone 10 mg take  4 each am x 2 days,   2 each am x 2 days,  1 each am x 2 days and stop  mucinex dm 1200 twice daily as long as you feel the urge to cough and supplement it with Tramadol 50 up to 2 to every 4 hours- once the cough is gone back off of it. Prednisone 10 mg take  4 each am x 2 days,    2 each am x 2 days,  1 each am x 2 days and stop  Pantoprazole (protonix) 40 mg   Take 30-60 min before first meal of the day and Pepcid 20 mg one bedtime until return to office - this is the best way to tell whether stomach acid is contributing to your problem.   GERD diet   04/28/2013 f/u ov/Saman Umstead re: GOLD III COPD / cough much better off ACEi Chief Complaint  Patient presents with  . Follow-up    Pt states breathing is slightly improved and cough is much improved. He is using combivent approx 4 times per day.   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 160 - 12.5 one daily  Ok to stop pepcid to see what effect this has on your cough and if worse restart, if no change in symptoms x sev weeks then  stop protonix too to see if really need it Start breo open it once  each am (x 2 drags to be sure you get it all) Only use  your combivent as needed       Admit date: 04/16/2014 Discharge date: 04/17/2014   Discharge Diagnoses:    COPD exacerbation   Acute respiratory failure  Hyperlipidemia  Essential hypertension   06/04/2014 f/u ov/Sabastien Tyler re: GOLD III / severe AS/ stopped smoking around 04/23/14  Chief Complaint  Patient presents with  . Acute Visit    Pt c/o increased SOB off and on since Feb 2016. He states that he wakes up in the night feeling out of breath and gets SOB walking from room to room at home. He is using combivent 5-6 x per day.    sleeping propped up now using noct combivent as well as q am breo   Cancelled ov with Dr [name] today "I wanted to get my breathing right first"  No obvious other patterns in  day to day or daytime variabilty or assoc cough or   cp or chest tightness, subjective wheeze overt sinus or hb symptoms. No unusual exp hx or h/o childhood pna/ asthma or knowledge of premature birth.  Sleeping ok without nocturnal  or early am exacerbation  of  respiratory  c/o's or need for noct saba. Also denies any obvious fluctuation of symptoms with weather or environmental changes or other aggravating or alleviating factors except as outlined above   Current Medications, Allergies, Complete Past Medical History, Past Surgical History, Family History, and Social History were reviewed in [employer] record.  ROS  The following are not active complaints unless bolded sore throat, dysphagia, dental problems, itching, sneezing,  nasal congestion or excess/ purulent secretions, ear ache,   fever, chills, sweats, unintended wt loss, pleuritic or exertional cp, hemoptysis,  orthopnea pnd or leg swelling, presyncope, palpitations, heartburn, abdominal pain, anorexia, nausea, vomiting, diarrhea  or change in bowel or urinary habits, change in stools or urine, dysuria,hematuria,  rash, arthralgias, visual complaints, headache, numbness weakness or ataxia or problems with walking or coordination,  change in mood/affect or memory.       .                Objective:   Physical Exam    amb wm nad    06/04/2014        137  Wt Readings from Last 3 Encounters:  06/09/13 133 lb (60.328 kg)  04/28/13 128 lb (58.06 kg)  04/04/13 130 lb (58.968 kg)     HEENT mild turbinate edema.  Oropharynx no thrush or excess pnd or cobblestoning.  No JVD or cervical adenopathy. Mild accessory muscle hypertrophy. Trachea midline, nl thryroid. Chest was hyperinflated by percussion with diminished breath sounds and moderate increased exp time without wheeze. Hoover sign positive at mid inspiration. Regular rate and rhythm with III/VI sys  Murmur no  gallop or rub or increase P2 or edema.  Abd: no hsm, nl excursion. Ext warm without cyanosis or clubbing.   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 Calcified pleural plaques. No evidence of active pulmonary disease.       Assessment & Plan:

## 2014-06-04 NOTE — Patient Instructions (Addendum)
Prednisone 10 mg take  4 each am x 2 days,   2 each am x 2 days,  1 each am x 2 days and stop   Add pepcid 20 mg at bedtime (over the counter)  Prop up as much as possible at bedtime   No change Breo each am  combivent up to 1 puff every 6 hours as needed   Call Dr [name] office today to reschedule office visit asap as your have severe aortic stenosis for which there is no medical treatment

## 2014-06-05 ENCOUNTER — Encounter: Payer: Self-pay | Admitting: Internal Medicine

## 2014-06-05 NOTE — Assessment & Plan Note (Signed)
-   PFT's 04/28/2013  FEV1 0.88 (40%) with ratio 44 and 14% better p B2 dlco 45 corrects to 83 - Trial of breo 04/28/2013 > improved symptoms  06/09/2013  - spirometry 06/04/2014 FEV1  0.76 (29%) ratio 45    I had an extended discussion with the patient reviewing all relevant studies completed to date and  lasting 15 to 20 minutes of a 25 minute visit on the following ongoing concerns:   His copd is severe but relatively stable and would expect it to stay that way now that he's stopped smoking   The problem with breathing now is likely due to critical AS and I there is no medical option for rx so issue is ? What is he a candidate for ?  No change in pulmonary meds in meantime    Each maintenance medication was reviewed in detail including most importantly the difference between maintenance and as needed and under what circumstances the prns are to be used.  Please see instructions for details which were reviewed in writing and the patient given a copy.

## 2014-06-05 NOTE — Assessment & Plan Note (Signed)
-   F/u CT 09/08/2013 1. Stable extensive calcified pleural plaque formation consistent with asbestos exposure. 2. Multiple pulmonary nodules are unchanged from the CT of 6 months ago. Given risk factors for lung cancer, continued follow up is recommended with chest CT in 6 months> done 04/20/14 no change >repeat in 12 m in tickle file

## 2014-06-05 NOTE — Assessment & Plan Note (Signed)
Strongly suspect his worsening doe and noct "asthma" are related to high pressures from AS/ cardiac asthma and not primary airways dz  He is certainly not a great candidate for AVR but nothing to offer him medically so asked him to reschedule eval asap with Dr [name]

## 2014-06-12 ENCOUNTER — Encounter: Payer: Medicare Other | Admitting: Thoracic Surgery (Cardiothoracic Vascular Surgery)

## 2014-06-21 ENCOUNTER — Encounter: Payer: Self-pay | Admitting: Thoracic Surgery (Cardiothoracic Vascular Surgery)

## 2014-06-21 ENCOUNTER — Other Ambulatory Visit: Payer: Self-pay | Admitting: Family Medicine

## 2014-06-21 ENCOUNTER — Institutional Professional Consult (permissible substitution) (INDEPENDENT_AMBULATORY_CARE_PROVIDER_SITE_OTHER): Payer: Medicare Other | Admitting: Thoracic Surgery (Cardiothoracic Vascular Surgery)

## 2014-06-21 VITALS — BP 152/60 | HR 95 | Resp 19 | Ht 63.0 in | Wt 135.0 lb

## 2014-06-21 DIAGNOSIS — I350 Nonrheumatic aortic (valve) stenosis: Secondary | ICD-10-CM

## 2014-06-21 NOTE — Progress Notes (Addendum)
[facility]  MULTIDISCIPLINARY HEART VALVE CLINIC    CARDIOTHORACIC SURGERY CONSULTATION REPORT  Referring Provider is Ronald Pain, MD PCP is Ronald Koyanagi, DO  Chief Complaint  Patient presents with  . Aortic Stenosis    HPI:  Patient is a 74 year old white male with history of aortic stenosis, severe COPD, hypertension, peripheral vascular disease, hyperlipidemia, and iron deficient anemia with GI bleeding from angiodysplasia in the past who has been referred to discuss treatment options for management of severe symptomatic aortic stenosis.  The patient states that he has been told he has a heart murmur for most of his life. He has been followed by Dr. Marlou Bell for the last several years with known history of aortic stenosis.  An echocardiogram performed 01/29/2012 revealed what was felt to be moderate aortic stenosis with normal left ventricular systolic function based upon peak velocity across the aortic valve measured 3.4 m/s corresponding to a mean transvalvular gradient estimated at 28 mmHg.  This past December the patient underwent left knee arthroscopy for torn cartilage under general anesthesia. The patient tolerated the procedure well, but within a few weeks he began to experience progressive symptoms of shortness of breath.  Transthoracic echocardiogram performed 04/06/2014 revealed the presence of severe aortic stenosis based upon peak velocity across the aortic valve that ranged between 3.5 and 3.8 m/s corresponding to peak-to-peak and mean transvalvular gradients estimated to be 60 and 34 mmHg, respectively.  Aortic valve area was calculated to be 0.8 cm.  Left ventricular systolic function remained preserved with ejection fraction estimated 55%.  Shortly after that the patient was hospitalized on 04/16/2014 with acute hypoxic respiratory failure.  He was initially treated using BiPAP and he recovered quickly.  He was diagnosed with presumed flare of COPD. Symptoms  continued to improve with bronchodilators, antibiotics, and steroids.  He was seen in follow-up shortly after hospital discharge by Dr. Marlou Bell who felt concerned that the patient's aortic stenosis might be contributing to his problems with dyspnea.  The patient was referred for elective surgical consultation at that time, but he decided to postpone consultation until after he had been seen in follow-up by his pulmonologist, Dr. Melvyn Bell.  On 05/31/2014 the patient developed another acute exacerbation of resting shortness of breath and dry non-productive cough for which he was briefly hospitalized. He was again treated for presumed flare of COPD and discharged from the hospital.  The patient was seen in follow-up by Dr. Melvyn Bell on 06/04/2014 who felt concerned that the patient's symptoms of progressive shortness of breath with orthopnea and PND sounded concerning for likely congestive heart failure.  The patient now presents for elective surgical consultation.  The patient is married and lives locally in [name] with his wife. He is originally from [name], but relocated to [name]. He retired in May 2015 after having previously spent his career working for a company that makes [employer]ical [employer]. Up until recently he reports no specific physical limitations, although he admits that he does not exercise on a regular basis and he lives a somewhat sedentary lifestyle. He has a long-standing history of tobacco abuse dating back more than 63 years. In retirement the patient reports smoking between 1-1/2 and 2 packs of cigarettes daily. He quit smoking completely in January of this year following his first hospitalization for shortness of breath. He states that since his most recent hospitalization 3 weeks ago he has still not recovered to his previous baseline. He reports shortness of breath with mild-to-moderate exertion.  He has to stop frequently to rest and catch his breath when he is tending to chores  around the house. Immediately prior to the time he was hospitalized in January and again in March, the patient was experiencing resting shortness of breath with PND and orthopnea. He denies any history of chest Bell or chest tightness with exertion. He denies any palpitations, dizzy spells, or syncope. He reports a chronic dry nonproductive cough.  Past Medical History  Diagnosis Date  . Polyp of nasal cavity   . Rosacea   . Benign neoplasm of colon   . Tobacco abuse   . Psychosexual dysfunction with inhibited sexual excitement   . COPD   . Hypertension   . Hyperlipidemia   . GERD (gastroesophageal reflux disease)   . Anemia   . Arthritis   . GI bleed 2010    4 units PRBCs  . Adenomatous colon polyp   . Peripheral vascular disease   . Irregular heartbeat   . Shingles   . Carotid artery occlusion   . Angiodysplasia of intestine with hemorrhage   . Pleural plaque with presence of asbestos 03/27/2013    Followed in Pulmonary clinic/ [facility]/ [place] - F/u CT 09/08/2013 1. Stable extensive calcified pleural plaque formation consistent with asbestos exposure. 2. Multiple pulmonary nodules are unchanged from the CT of 6 months ago. Given risk factors for lung cancer, continued follow up is recommended with chest CT in 6 months> done 04/20/14 no change >repeat in 12 m in tickle file     . COPD GOLD III with min reversibilty  08/11/2006    Followed in Pulmonary clinic/ [facility]/ [place] - PFT's 04/28/2013  FEV1 0.88 (40%) with ratio 44 and 14% better p B2 dlco 45 corrects to 83 - Trial of breo 04/28/2013 > improved symptoms  06/09/2013  - spirometry 06/04/2014 FEV1  0.76 (29%) ratio 45      . GERD 11/30/2008    Qualifier: Diagnosis of  By: Marijean Niemann CMA, Danielle    . Iron deficiency anemia 01/25/2009    Qualifier: Diagnosis of  By: Henrene Pastor MD, Docia Chuck   . PVD (peripheral vascular disease) 10/18/2012  . Carotid artery stenosis 04/22/2012    Past Surgical History  Procedure Laterality Date  .  Iliac artery stent Left 2005    CIA  . Esophagogastroduodenoscopy  2012    normal  . Colonoscopy  July 2015    Dr. Henrene Pastor  . Tonsillectomy    . Cardiac catheterization  2001  . Knee arthroscopy with medial menisectomy Left 03/08/2014    Procedure: LEFT KNEE SCOPE WITH MEDIAL MENISECTOMY AND CHONDROPLASTY;  Surgeon: Ninetta Lights, MD;  Location: [facility];  Service: Orthopedics;  Laterality: Left;    Family History  Problem Relation Age of Onset  . Lung disease Mother     pulm fibrosis  . Uterine cancer    . Colitis Father   . Colon cancer Neg Hx   . Heart disease Brother   . Hypertension Brother   . Hyperlipidemia Brother   . Heart disease Daughter     cad  . Hypertension Son     History   Social History  . Marital Status: Married    Spouse Name: N/A  . Number of Children: 4  . Years of Education: N/A   Occupational History  . Charity fundraiser    Social History Main Topics  . Smoking status: Former Smoker -- 0.75 packs/day for 55 years    Types:  Cigarettes, E-cigarettes    Quit date: 04/17/2014  . Smokeless tobacco: Never Used     Comment: pt states he knows he needs to quit. Also uses e-cig.  Marland Kitchen Alcohol Use: 2.4 oz/week    4 Glasses of wine per week     Comment: occasional  . Drug Use: No  . Sexual Activity:    Partners: Female   Other Topics Concern  . Not on file   Social History Narrative    Current Outpatient Prescriptions  Medication Sig Dispense Refill  . amLODipine (NORVASC) 10 MG tablet TAKE 1 TABLET (10 MG TOTAL) BY MOUTH DAILY. 90 tablet 3  . Ascorbic Acid (VITAMIN C) 250 MG tablet Take 500 mg by mouth daily.     Marland Kitchen aspirin 81 MG EC tablet Take 81 mg by mouth daily.      Marland Kitchen atorvastatin (LIPITOR) 40 MG tablet Take 1 tablet (40 mg total) by mouth daily. 90 tablet 3  . docusate sodium (COLACE) 100 MG capsule Take 100 mg by mouth 2 (two) times daily.    . famotidine (PEPCID) 20 MG tablet One at bedtime 30 tablet 11  . fenofibrate 160 MG tablet  Take 1 tablet (160 mg total) by mouth daily. 30 tablet 2  . Ferrous Sulfate (IRON) 325 (65 FE) MG TABS Take 1 tablet by mouth 2 (two) times daily. 90 each 3  . Fluticasone Furoate-Vilanterol (BREO ELLIPTA) 100-25 MCG/INH AEPB Inhale 1 puff into the lungs daily. 3 each 3  . guaiFENesin (MUCINEX) 600 MG 12 hr tablet Take 1 tablet (600 mg total) by mouth 2 (two) times daily as needed for cough or to loosen phlegm. 10 tablet 0  . Ipratropium-Albuterol (COMBIVENT RESPIMAT) 20-100 MCG/ACT AERS respimat Inhale 1 puff into the lungs 4 (four) times daily as needed for wheezing. 1 Inhaler 3  . Multiple Vitamin (MULTIVITAMIN WITH MINERALS) TABS tablet Take 1 tablet by mouth daily.    . Multiple Vitamins-Minerals (CENTRUM SILVER PO) Take 1 tablet by mouth daily.     . pantoprazole (PROTONIX) 40 MG tablet TAKE ONE TABLET BY MOUTH ONCE DAILY 90 tablet 3  . valsartan-hydrochlorothiazide (DIOVAN HCT) 160-12.5 MG per tablet Take 1 tablet by mouth daily. 90 tablet 3  . VOLTAREN 1 % GEL Apply 2 g topically 4 (four) times daily as needed (for arthritis).      No current facility-administered medications for this visit.    No Known Allergies    Review of Systems:   General:  normal appetite, decreased energy, no weight gain, no weight loss, no fever  Cardiac:  no chest Bell with exertion, no chest Bell at rest, + SOB with exertion, no current resting SOB, + PND, + orthopnea, + palpitations, + arrhythmia, no atrial fibrillation, no LE edema, no dizzy spells, no syncope  Respiratory:  + shortness of breath, no home oxygen, occasional productive cough, persistent dry cough, no bronchitis, no wheezing, no hemoptysis, no asthma, no Bell with inspiration or cough, no sleep apnea, no CPAP at night  GI:   no difficulty swallowing, occasional reflux, no frequent heartburn, no hiatal hernia, no abdominal Bell, no constipation, no diarrhea, no hematochezia, no hematemesis, + chronic melena  GU:   no dysuria,  no frequency,  no urinary tract infection, no hematuria, no enlarged prostate, no kidney stones, no kidney disease  Vascular:  no Bell suggestive of claudication, no Bell in feet, no leg cramps, no varicose veins, no DVT, no non-healing foot ulcer  Neuro:   no  stroke, no TIA's, no seizures, no headaches, no temporary blindness one eye,  no slurred speech, no peripheral neuropathy, no chronic Bell, no instability of gait, no memory/cognitive dysfunction  Musculoskeletal: no arthritis, no joint swelling, no myalgias, no difficulty walking, normal mobility   Skin:   no rash, no itching, no skin infections, no pressure sores or ulcerations  Psych:   no anxiety, no depression, no nervousness, no unusual recent stress  Eyes:   no blurry vision, no floaters, no recent vision changes, + wears glasses or contacts  ENT:   no hearing loss, no loose or painful teeth, edentulous with full set dentures, last saw dentist many years ago  Hematologic:  no easy bruising, + abnormal bleeding, no clotting disorder, no frequent epistaxis  Endocrine:  no diabetes, does not check CBG's at home           Physical Exam:   BP 152/60 mmHg  Pulse 95  Resp 19  Ht 5\' 3"  (1.6 m)  Wt 135 lb (61.236 kg)  BMI 23.92 kg/m2  SpO2 96%  General:    well-appearing  HEENT:  Unremarkable   Neck:   no JVD, no bruits, no adenopathy   Chest:   clear to auscultation, symmetrical breath sounds, no wheezes, no rhonchi   CV:   RRR, grade III/VI crescendo/decrescendo murmur heard best at RUSB,  no diastolic murmur  Abdomen:  soft, non-tender, no masses   Extremities:  warm, well-perfused, pulses diminished but palpable in groin, no LE edema  Rectal/GU  Deferred  Neuro:   Grossly non-focal and symmetrical throughout  Skin:   Clean and dry, no rashes, no breakdown   Diagnostic Tests:  Transthoracic Echocardiography  Patient:  Ronald Bell, Ronald Bell MR #:    [id number] Study Date: 04/06/2014 Gender:   M Age:    73 Height:   160  cm Weight:   59.9 kg BSA:    1.64 m^2 Pt. Status: Room:  Arvin Collard, M.D. REFERRING  Candee Furbish, M.D. SONOGRAPHER Wyatt Mage, RDCS ATTENDING  Lyman Bishop MD PERFORMING  [facility], Outpatient  cc:  ------------------------------------------------------------------- LV EF: 55% -  60%  ------------------------------------------------------------------- Indications:   Murmur (R01.1).  ------------------------------------------------------------------- History:  PMH:  Chronic obstructive pulmonary disease. Risk factors: PVD. Carotid artery stenosis. GERD. GI bleed. H/o Barrett&'s esophagus. Rosacea. Anemia. Current tobacco use. Hypertension. Dyslipidemia.  ------------------------------------------------------------------- Study Conclusions  - Left ventricle: The cavity size was is borderline dilated. Wall thickness was normal. Systolic function was normal. The estimated ejection fraction was in the range of 55% to 60%. Wall motion was normal; there were no regional wall motion abnormalities. Doppler parameters are consistent with abnormal left ventricular relaxation (grade 1 diastolic dysfunction). The E/e&' ratio is between 8-15, suggesting indeterminate LV filling pressure. - Aortic valve: Heavily calcified aortic valve leaflets. Peak and mean gradients of 60 and 34 mmHg, respectively. Based on an LVOT diameter of 2.0 cm, the calculated AVA is 0.8 cm2, consistent with severe aortic stenosis. There is mild regurgitation. Valve area (VTI): 0.74 cm^2. Valve area (Vmean): 0.68 cm^2. - Mitral valve: Calcified annulus. Mildly thickened leaflets . There was trivial regurgitation. - Left atrium: Severely dilated at 49 ml/m2.  Impressions:  - LVEF 55-60%, normal wall thickness, borderline dilated LV, normal wall motion, diastolic dysfunction, likely elevated LV filling pressure, there is severe aortic stenosis, peak  and mean gradients of 60 mmHG and 35 mmHg, respectively. AVA is around 0.8 cm2. Mild AI, severely dilated LA  Transthoracic echocardiography. M-mode,  complete 2D, spectral Doppler, and color Doppler. Birthdate: Patient birthdate: [birth year]. Age: Patient is 74 yr old. Sex: Gender: male. BMI: 23.4 kg/m^2. Blood pressure:   130/50 Patient status: Outpatient. Study date: Study date: 04/06/2014. Study time: 08:15 AM. Location: [facility]  -------------------------------------------------------------------  ------------------------------------------------------------------- Left ventricle: The cavity size was is borderline dilated. Wall thickness was normal. Systolic function was normal. The estimated ejection fraction was in the range of 55% to 60%. Wall motion was normal; there were no regional wall motion abnormalities. Doppler parameters are consistent with abnormal left ventricular relaxation (grade 1 diastolic dysfunction). The E/e&' ratio is between 8-15, suggesting indeterminate LV filling pressure.  ------------------------------------------------------------------- Aortic valve: Heavily calcified aortic valve leaflets. Peak and mean gradients of 60 and 34 mmHg, respectively. Based on an LVOT diameter of 2.0 cm, the calculated AVA is 0.8 cm2, consistent with severe aortic stenosis. There is mild regurgitation. Doppler: VTI ratio of LVOT to aortic valve: 0.24. Valve area (VTI): 0.74 cm^2. Indexed valve area (VTI): 0.45 cm^2/m^2. Mean velocity ratio of LVOT to aortic valve: 0.22. Valve area (Vmean): 0.68 cm^2. Indexed valve area (Vmean): 0.41 cm^2/m^2.  Mean gradient (S): 29 mm Hg.  ------------------------------------------------------------------- Aorta: Aortic root: The aortic root was normal in size. Ascending aorta: The ascending aorta was normal in size.  ------------------------------------------------------------------- Mitral valve:   Calcified annulus. Mildly thickened leaflets . Doppler: There was trivial regurgitation.  Peak gradient (D): 3 mm Hg.  ------------------------------------------------------------------- Left atrium: Severely dilated at 49 ml/m2.  ------------------------------------------------------------------- Atrial septum: No defect or patent foramen ovale was identified.  ------------------------------------------------------------------- Right ventricle: The cavity size was normal. Wall thickness was normal. Systolic function was normal.  ------------------------------------------------------------------- Pulmonic valve:  The valve appears to be grossly normal. Doppler: There was no significant regurgitation.  ------------------------------------------------------------------- Tricuspid valve:  Doppler: There was no significant regurgitation.  ------------------------------------------------------------------- Pulmonary artery:  The main pulmonary artery was normal-sized.  ------------------------------------------------------------------- Right atrium: The atrium was normal in size.  ------------------------------------------------------------------- Pericardium: There was no pericardial effusion.  ------------------------------------------------------------------- Systemic veins: Inferior vena cava: The vessel was normal in size. The respirophasic diameter changes were in the normal range (>= 50%), consistent with normal central venous pressure.  ------------------------------------------------------------------- Measurements  Left ventricle              Value     Reference LV ID, ED, PLAX chordal      (H)   55  mm    43 - 52 LV ID, ES, PLAX chordal      (H)   41  mm    23 - 38 LV fx shortening, PLAX chordal  (L)   25  %    >=29 LV PW thickness, ED            8   mm    --------- IVS/LV PW  ratio, ED            1       <=1.3 Stroke volume, 2D             62  ml    --------- Stroke volume/bsa, 2D           38  ml/m^2  --------- LV e&', lateral              7.46 cm/s   --------- LV E/e&', lateral             12.23     --------- LV e&', medial  5.84 cm/s   --------- LV E/e&', medial              15.62     --------- LV e&', average              6.65 cm/s   --------- LV E/e&', average             13.71     ---------  Ventricular septum            Value     Reference IVS thickness, ED             8   mm    ---------  LVOT                   Value     Reference LVOT ID, S                20  mm    --------- LVOT area                 3.14 cm^2   --------- LVOT mean velocity, S           54.1 cm/s   --------- LVOT VTI, S                19.9 cm    ---------  Aortic valve               Value     Reference Aortic valve mean velocity, S       250  cm/s   --------- Aortic valve VTI, S            84.5 cm    --------- Aortic mean gradient, S          29  mm Hg  --------- VTI ratio, LVOT/AV            0.24      --------- Aortic valve area, VTI          0.74 cm^2   --------- Aortic valve area/bsa, VTI        0.45 cm^2/m^2 --------- Velocity ratio, mean, LVOT/AV       0.22      --------- Aortic valve area, mean velocity     0.68 cm^2   --------- Aortic valve area/bsa, mean        0.41 cm^2/m^2 --------- velocity Aortic regurg pressure half-time     316  ms    ---------  Aorta                   Value      Reference Aortic root ID, ED            32  mm    ---------  Left atrium                Value     Reference LA ID, A-P, ES              41  mm    --------- LA ID/bsa, A-P          (H)   2.5  cm/m^2  <=2.2 LA volume, S               78.5 ml    --------- LA volume/bsa, S             47.9 ml/m^2  --------- LA volume, ES, 1-p A4C          63.9 ml    ---------  LA volume/bsa, ES, 1-p A4C        39  ml/m^2  --------- LA volume, ES, 1-p A2C          87  ml    --------- LA volume/bsa, ES, 1-p A2C        53.1 ml/m^2  ---------  Mitral valve               Value     Reference Mitral E-wave peak velocity        91.2 cm/s   --------- Mitral deceleration time     (L)   102  ms    150 - 230 Mitral peak gradient, D          3   mm Hg  --------- Mitral E/A ratio, peak          0.7      ---------  Systemic veins              Value     Reference Estimated CVP               3   mm Hg  ---------  Right ventricle              Value     Reference RV e&', lateral              14.1 cm/s   ---------  Legend: (L) and (H) mark values outside specified reference range.  ------------------------------------------------------------------- Prepared and Electronically Authenticated by  Lyman Bishop MD 2016-01-15T10:38:01   CT CHEST WITHOUT CONTRAST  TECHNIQUE: Multidetector CT imaging of the chest was performed following the standard protocol without IV contrast.  COMPARISON: CT chest dated 09/08/2013 and 03/10/2013  FINDINGS: Mediastinum/Nodes: The heart is normal in size. No pericardial effusion.  Left main and 3 vessel coronary atherosclerosis.  Atherosclerotic  calcifications of the aortic arch.  No suspicious mediastinal or axillary lymphadenopathy.  Visualized thyroid is unremarkable.  Lungs/Pleura: 7 x 4 mm elongated right apical nodule (series 3/image 8), unchanged.  Additional scattered right upper lobe nodules measuring up to 6 x 4 mm (series 3/ image 14). Additional tiny bilateral pulmonary nodules are unchanged.  Underlying mild to moderate centrilobular and paraseptal emphysematous changes.  Calcified bilateral pleural plaques. No pleural effusion or pneumothorax.  Upper abdomen: Visualized upper abdomen is notable for vascular calcifications  Musculoskeletal: No focal osseous lesions.  IMPRESSION: Scattered bilateral pulmonary nodules measuring up to 7 x 4 mm (mean 5.5 mm) in the right lung apex.  12 month stability has been demonstrated. Follow-up CT chest is suggested in 12 months (24 month from initial CT).  Underlying mild to moderate emphysematous changes.  Calcified pleural plaques, reflecting asbestos related pleural disease.   Electronically Signed  By: Julian Hy M.D.  On: 04/20/2014 12:42   Pulmonary Function Tests  Baseline      Post-bronchodilator  FVC  2.00 L  (65% predicted) FVC  2.17 L  (71% predicted) FEV1  0.88 L  (40% predicted) FEV1  1.02 L  (46% predicted) FEF25-75 0.32 L  (20% predicted) FEF25-75 0.43 L  (26% predicted)  TLC  5.80 L  (104% predicted) RV  3.40 L  (159% predicted) DLCO  45% predicted    STS Risk Calculator  Procedure    AVR  Risk of Mortality   5.9% Morbidity or Mortality  27.9% Prolonged LOS   12.7% Short LOS    24.4% Permanent Stroke   1.8% Prolonged Vent Support  17.7% DSW Infection  0.5% Renal Failure    6.4% Reoperation    12.5%     Impression:  The patient has stage D severe symptomatic aortic stenosis. He presents with progressive symptoms of shortness of breath that are likely multifactorial and at least partially  related to his underlying severe COPD.  However, the patient's presentation is highly suggestive of progressive symptoms of chronic diastolic congestive heart failure, with current symptoms consistent with New York Heart Association functional class III.  In addition, the patient has had 2 recent hospitalizations with acute hypoxic respiratory failure associated with the development of PND and orthopnea, and symptoms have persisted despite the fact the patient quit smoking 2 months ago.   I have personally reviewed the patient's recent transthoracic echocardiogram and non-contrast CT scan of the chest.  All 3 leaflets of the patient's aortic valve are partially calcified, thickened, and have severely restricted leaflet mobility. The patient had some degree of irregularity in his heart rhythm during the echocardiogram, and peak velocity across the aortic valve was measured as high as 3.8 m/s.  The dimensionless velocity ratio between LVOT and aortic valve measured only 0.22.  The left ventricular ejection fraction was estimated at 55-60%, but there appeared to be a significant component of systolic dysfunction in the high basilar segments.  Based upon these findings I agree that the aortic stenosis is severe and definitive surgical management of the patient's aortic stenosis is indicated. Unfortunately, risks associated with conventional surgical aortic valve replacement would be extremely high. 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  Transcatheter aortic valve replacement would be a preferable alternative, although it seems unlikely that the patient might have adequate pelvic vascular access for a transfemoral approach because of his known severe aortoiliac occlusive  disease.   Plan:  The patient was counseled at length regarding treatment alternatives for management of severe symptomatic aortic stenosis. Alternative approaches such as conventional aortic valve replacement, transcatheter aortic valve replacement, and palliative medical therapy were compared and contrasted at length.  The risks associated with conventional surgical aortic valve replacement were been discussed in detail, as were the reasons why conventional surgery appears relatively contraindicated. Long-term prognosis with medical therapy was discussed. This discussion was placed in the context of the patient's own specific clinical presentation and past medical history.  All of his questions been addressed.  The patient is interested in proceeding with further diagnostic testing. As a first step he will need to undergo left and right heart catheterization with diagnostic coronary angiography. Under the circumstances I favor an attempt to directly measure simultaneous pressures across the aortic valve to directly measure his transvalvular gradient. Depending upon findings the patient will subsequently undergo CT angiography to further characterize the anatomical feasibility of transcatheter aortic valve replacement.  Unfortunately, the patient's wife is currently suffering medical problems of her own which may impact timing of further testing and treatment for the patient.  I spent in excess of 90 minutes during the conduct of this office consultation and >50% of this time involved direct face-to-face encounter with the patient for counseling and/or coordination of their care.   Valentina Gu. Roxy Manns, MD 06/21/2014 4:14 PM

## 2014-06-22 ENCOUNTER — Telehealth: Payer: Self-pay | Admitting: *Deleted

## 2014-06-22 NOTE — Telephone Encounter (Signed)
-----   Message from Jerline Pain, MD sent at 06/21/2014  3:47 PM EDT ----- Please have him come in to discuss cath for aortic stenosis. Will need right and left.  Dr. Roxy Manns saw.  Thanks Candee Furbish, MD

## 2014-06-22 NOTE — Telephone Encounter (Signed)
Scheduled pt to come in to discuss cath Tuesday 4/5 at 10:15.  He is aware.

## 2014-06-22 NOTE — Telephone Encounter (Signed)
Will contact pt able appt.

## 2014-06-26 ENCOUNTER — Encounter: Payer: Self-pay | Admitting: *Deleted

## 2014-06-26 ENCOUNTER — Ambulatory Visit (INDEPENDENT_AMBULATORY_CARE_PROVIDER_SITE_OTHER): Payer: Medicare Other | Admitting: Cardiology

## 2014-06-26 ENCOUNTER — Encounter: Payer: Self-pay | Admitting: Cardiology

## 2014-06-26 VITALS — BP 122/58 | HR 94 | Ht 63.0 in | Wt 141.4 lb

## 2014-06-26 DIAGNOSIS — I2510 Atherosclerotic heart disease of native coronary artery without angina pectoris: Secondary | ICD-10-CM

## 2014-06-26 DIAGNOSIS — Z01818 Encounter for other preprocedural examination: Secondary | ICD-10-CM

## 2014-06-26 DIAGNOSIS — I2583 Coronary atherosclerosis due to lipid rich plaque: Secondary | ICD-10-CM

## 2014-06-26 DIAGNOSIS — I350 Nonrheumatic aortic (valve) stenosis: Secondary | ICD-10-CM

## 2014-06-26 DIAGNOSIS — I739 Peripheral vascular disease, unspecified: Secondary | ICD-10-CM

## 2014-06-26 LAB — BASIC METABOLIC PANEL
BUN: 19 mg/dL (ref 6–23)
CALCIUM: 10 mg/dL (ref 8.4–10.5)
CO2: 27 meq/L (ref 19–32)
CREATININE: 0.95 mg/dL (ref 0.40–1.50)
Chloride: 101 mEq/L (ref 96–112)
GFR: 81.95 mL/min (ref >60.00)
GLUCOSE: 91 mg/dL (ref 70–99)
Potassium: 4.5 mEq/L (ref 3.5–5.1)
Sodium: 133 mEq/L — ABNORMAL LOW (ref 135–145)

## 2014-06-26 LAB — CBC
HEMATOCRIT: 33.7 % — AB (ref 39.0–52.0)
Hemoglobin: 11.3 g/dL — ABNORMAL LOW (ref 13.0–17.0)
MCHC: 33.5 g/dL (ref 30.0–36.0)
MCV: 102.2 fl — ABNORMAL HIGH (ref 78.0–100.0)
Platelets: 286 10*3/uL (ref 150.0–400.0)
RBC: 3.29 Mil/uL — AB (ref 4.22–5.81)
RDW: 14.4 % (ref 11.5–15.5)
WBC: 8 10*3/uL (ref 4.0–10.5)

## 2014-06-26 LAB — PROTIME-INR
INR: 1 ratio (ref 0.8–1.0)
Prothrombin Time: 10.8 s (ref 9.6–13.1)

## 2014-06-26 NOTE — Progress Notes (Signed)
Ronald Bell. [address], [address] [place],   [address] Phone: [phone] Fax:  [phone]  Date:  06/26/2014   ID:  [name], Payer 1941-02-03, MRN [id number]  PCP:  [name], DO   History of Present Illness: Ronald Bell is a 74 y.o. male here for aortic stenosis follow up. He is had discussion with Dr. Roxy Manns and we would like to proceed with cardiac catheterization, left and right with valve area.   He has had significant peripheral vascular disease as described on previous cardiac catheterization report, sees Dr. Donnetta Hutching. 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 Left iliac artery has stent in place, approximately less than 50% stenosis based upon vascular study. He has had prior left carotid endarterectomy.  Recently underwent knee replacement. While at home, developed severe respiratory issues and was hospitalized for a day and a half he states. He is feeling much better.  Recent echocardiogram however demonstrated severe aortic stenosis as described below.  Dr. [name]. Smoking cessation. COPD. He has successfully quit smoking since that experience.   - LVEF 55-60%, normal wall thickness, borderline dilated LV, normal wall motion, diastolic dysfunction, likely elevated LV filling pressure, there is severe aortic stenosis, peak and mean gradients of 60 mmHG and 35 mmHg, respectively. AVA is around 0.8 cm2. Mild AI, severely dilated LA  Has a family history of heart disease with his brother and daughter. He carries diagnoses of hypertension and hyperlipidemia. Former Smoker quit in January 2016.  Hyperinflated lungs. Extensive pleural plaques noted. Coronary artery calcifications noted on CT scan 09/08/13 as well as aortic atherosclerosis.  Prior nuclear stress test 5 years ago showed no ischemia.  Wt Readings from Last 3 Encounters:  06/26/14 141 lb 6.4 oz (64.139 kg)  06/21/14 135 lb (61.236 kg)  06/04/14 136 lb 12.8 oz (62.052 kg)     Past Medical  History  Diagnosis Date  . Polyp of nasal cavity   . Rosacea   . Benign neoplasm of colon   . Tobacco abuse   . Psychosexual dysfunction with inhibited sexual excitement   . COPD   . Hypertension   . Hyperlipidemia   . GERD (gastroesophageal reflux disease)   . Anemia   . Arthritis   . GI bleed 2010    4 units PRBCs  . Adenomatous colon polyp   . Peripheral vascular disease   . Irregular heartbeat   . Shingles   . Carotid artery occlusion   . Angiodysplasia of intestine with hemorrhage   . Pleural plaque with presence of asbestos 03/27/2013    Followed in Pulmonary clinic/ [facility]/ [facility] - F/u CT 09/08/2013 1. Stable extensive calcified pleural plaque formation consistent with asbestos exposure. 2. Multiple pulmonary nodules are unchanged from the CT of 6 months ago. Given risk factors for lung cancer, continued follow up is recommended with chest CT in 6 months> done 04/20/14 no change >repeat in 12 m in tickle file     . COPD GOLD III with min reversibilty  08/11/2006    Followed in Pulmonary clinic/ [facility]/ [facility] - PFT's 04/28/2013  FEV1 0.88 (40%) with ratio 44 and 14% better p B2 dlco 45 corrects to 83 - Trial of breo 04/28/2013 > improved symptoms  06/09/2013  - spirometry 06/04/2014 FEV1  0.76 (29%) ratio 45      . GERD 11/30/2008    Qualifier: Diagnosis of  By: Marijean Niemann CMA, Danielle    . Iron deficiency anemia 01/25/2009  Qualifier: Diagnosis of  By: Henrene Pastor MD, Docia Chuck   . PVD (peripheral vascular disease) 10/18/2012  . Carotid artery stenosis 04/22/2012    Past Surgical History  Procedure Laterality Date  . Iliac artery stent Left 2005    CIA  . Esophagogastroduodenoscopy  2012    normal  . Colonoscopy  July 2015    Dr. Henrene Pastor  . Tonsillectomy    . Cardiac catheterization  2001  . Knee arthroscopy with medial menisectomy Left 03/08/2014    Procedure: LEFT KNEE SCOPE WITH MEDIAL MENISECTOMY AND CHONDROPLASTY;  Surgeon: Ninetta Lights, MD;  Location: [facility];   Service: Orthopedics;  Laterality: Left;    Current Outpatient Prescriptions  Medication Sig Dispense Refill  . amLODipine (NORVASC) 10 MG tablet TAKE 1 TABLET (10 MG TOTAL) BY MOUTH DAILY. 90 tablet 3  . Ascorbic Acid (VITAMIN C) 250 MG tablet Take 500 mg by mouth daily.     Marland Kitchen aspirin 81 MG EC tablet Take 81 mg by mouth daily.      Marland Kitchen atorvastatin (LIPITOR) 40 MG tablet Take 1 tablet (40 mg total) by mouth daily. 90 tablet 3  . docusate sodium (COLACE) 100 MG capsule Take 100 mg by mouth 2 (two) times daily.    . famotidine (PEPCID) 20 MG tablet One at bedtime 30 tablet 11  . fenofibrate 160 MG tablet TAKE ONE TABLET BY MOUTH ONCE DAILY 30 tablet 5  . Ferrous Sulfate (IRON) 325 (65 FE) MG TABS Take 1 tablet by mouth 2 (two) times daily. 90 each 3  . Fluticasone Furoate-Vilanterol (BREO ELLIPTA) 100-25 MCG/INH AEPB Inhale 1 puff into the lungs daily. 3 each 3  . guaiFENesin (MUCINEX) 600 MG 12 hr tablet Take 1 tablet (600 mg total) by mouth 2 (two) times daily as needed for cough or to loosen phlegm. 10 tablet 0  . Ipratropium-Albuterol (COMBIVENT RESPIMAT) 20-100 MCG/ACT AERS respimat Inhale 1 puff into the lungs 4 (four) times daily as needed for wheezing. 1 Inhaler 3  . Multiple Vitamin (MULTIVITAMIN WITH MINERALS) TABS tablet Take 1 tablet by mouth daily.    . Multiple Vitamins-Minerals (CENTRUM SILVER PO) Take 1 tablet by mouth daily.     . pantoprazole (PROTONIX) 40 MG tablet TAKE ONE TABLET BY MOUTH ONCE DAILY 90 tablet 3  . valsartan-hydrochlorothiazide (DIOVAN HCT) 160-12.5 MG per tablet Take 1 tablet by mouth daily. 90 tablet 3  . VOLTAREN 1 % GEL Apply 2 g topically 4 (four) times daily as needed (for arthritis).      No current facility-administered medications for this visit.    Allergies:   No Known Allergies  Social History:  The patient  reports that he quit smoking about 2 months ago. His smoking use included Cigarettes and E-cigarettes. He has a 41.25 pack-year smoking  history. He has never used smokeless tobacco. He reports that he drinks about 2.4 oz of alcohol per week. He reports that he does not use illicit drugs.   Family History  Problem Relation Age of Onset  . Lung disease Mother     pulm fibrosis  . Uterine cancer    . Colitis Father   . Colon cancer Neg Hx   . Heart disease Brother   . Hypertension Brother   . Hyperlipidemia Brother   . Heart disease Daughter     cad  . Hypertension Son     ROS:  Please see the history of present illness.   Positive for knee pain, left. He denies  any syncope, bleeding, orthopnea, PND, anginal symptoms. He has baseline shortness of breath with COPD. This has not increased recently. No strokelike symptoms, Dr. Donnetta Hutching.   All other systems reviewed and negative.   PHYSICAL EXAM: VS:  BP 122/58 mmHg  Pulse 94  Ht 5\' 3"  (1.6 m)  Wt 141 lb 6.4 oz (64.139 kg)  BMI 25.05 kg/m2 Well nourished, well developed, in no acute distressUsing a cane HEENT: normal, [place]/AT, EOMI Neck: no JVD, normal carotid upstroke, Bilat bruit, loud Cardiac:  normal S1, S2; RRR; 3/6 systolic right upper sternal border murmur Lungs:  Diminished breath sounds bilaterally however clear to auscultation bilaterally, no wheezing, rhonchi or rales Abd: soft, nontender, no hepatomegaly, no bruits Ext: no edema, 2+ distal pulses Skin: warm and dry GU: deferred Neuro: no focal abnormalities noted, AAO x 3  EKG:    03/05/14-sinus rhythm, 84, no other significant abnormalities noted. Echocardiogram: 04/06/14 - LVEF 55-60%, normal wall thickness, borderline dilated LV, normal wall motion, diastolic dysfunction, likely elevated LV filling pressure, there is severe aortic stenosis, peak and mean gradients of 60 mmHG and 35 mmHg, respectively. AVA is around 0.8 cm2. Mild AI, severely dilated LA  November/2013-normal ejection fraction 65% with moderate aortic stenosis 3.4 m/s, 28 mmHg mean gradient. Mild aortic valve regurgitation.  Nuclear stress  test 02/2011-low risk, no ischemia   Cardiac catheterization 2001- showed diffuse 40% stenosis of LAD, heavily calcified in midportion, circumflex calcification, right coronary artery 30-45% in the mid and proximal segment, subclavian 30-40% stenosis left as well as calcification of the ascending arch. Left internal iliac 50-60% stenosis, right internal iliac occluded, fed by collateral flow. 50% left renal artery stenosis.  ASSESSMENT AND PLAN:  1. Severe aortic stenosis- Dr. Roxy Manns has seen in consultation.  With his COPD, TAVR. He does have fairly significant peripheral vascular disease however. Please see catheterization report from 2001 showing completely occluded right internal iliac artery, left common iliac revealed 40% proximal stenosis. Diffuse atherosclerotic changes throughout distal aorta. I think that this would prohibit potential femoral artery approach.? 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 Diagnostic coronary angiogram, left and right heart catheterization discussed via the radial/brachial artery approach. We will obtain aortic valve area as well as gradient crossing the valve. Right-sided heart pressures will be obtained as well. Risks and benefits of procedure have been discussed including stroke, heart attack, death, bleeding, renal impairment. I will ask him to hold his angiotensin receptor blocker/HCTZ one day prior to procedure. We will hydrate.Marland Kitchen He could have advancement of his coronary artery disease which previously was described as calcified but non-flow limiting. His recent hospitalization was secondary to respiratory difficulty. 2. Carotid artery disease-Dr. Donnetta Hutching. Doing well. Left CEA. 3. Essential hypertension-currently well controlled. Medications reviewed. 4. Hyperlipidemia-excellent use of statin with his peripheral vascular disease. 5. COPD-stable. Seeing Dr. [name] soon. 6. Six-week follow-up. (possibly post op at that time.)  Signed, Candee Furbish, MD [facility]  06/26/2014 10:14 AM

## 2014-06-26 NOTE — Addendum Note (Signed)
Addended by: Candee Furbish C on: 06/26/2014 11:16 AM   Modules accepted: Orders

## 2014-06-26 NOTE — Patient Instructions (Signed)
The current medical regimen is effective;  continue present plan and medications.  Please have blood work today (CBC,BMP and INR)  Your physician has requested that you have a cardiac catheterization. Cardiac catheterization is used to diagnose and/or treat various heart conditions. Doctors may recommend this procedure for a number of different reasons. The most common reason is to evaluate chest pain. Chest pain can be a symptom of coronary artery disease (CAD), and cardiac catheterization can show whether plaque is narrowing or blocking your heart's arteries. This procedure is also used to evaluate the valves, as well as measure the blood flow and oxygen levels in different parts of your heart. For further information please visit [email or web address]. Please follow instruction sheet, as given.  Follow up with Dr Marlou Porch in 6 weeks.  Thank you for choosing [facility]!!

## 2014-06-28 ENCOUNTER — Encounter (HOSPITAL_COMMUNITY): Payer: Self-pay | Admitting: Cardiology

## 2014-06-28 ENCOUNTER — Ambulatory Visit (HOSPITAL_COMMUNITY)
Admission: RE | Admit: 2014-06-28 | Discharge: 2014-06-28 | Disposition: A | Discharge status: Home / Self Care | Payer: Medicare Other | Source: Ambulatory Visit | Attending: Cardiology | Admitting: Cardiology

## 2014-06-28 ENCOUNTER — Other Ambulatory Visit: Payer: Self-pay | Admitting: *Deleted

## 2014-06-28 ENCOUNTER — Encounter (HOSPITAL_COMMUNITY)
Admission: RE | Disposition: A | Discharge status: Home / Self Care | Payer: Self-pay | Source: Ambulatory Visit | Attending: Cardiology

## 2014-06-28 DIAGNOSIS — I350 Nonrheumatic aortic (valve) stenosis: Secondary | ICD-10-CM | POA: Diagnosis not present

## 2014-06-28 DIAGNOSIS — I739 Peripheral vascular disease, unspecified: Secondary | ICD-10-CM | POA: Diagnosis not present

## 2014-06-28 DIAGNOSIS — E785 Hyperlipidemia, unspecified: Secondary | ICD-10-CM | POA: Insufficient documentation

## 2014-06-28 DIAGNOSIS — K219 Gastro-esophageal reflux disease without esophagitis: Secondary | ICD-10-CM | POA: Diagnosis not present

## 2014-06-28 DIAGNOSIS — Z86010 Personal history of colonic polyps: Secondary | ICD-10-CM | POA: Diagnosis not present

## 2014-06-28 DIAGNOSIS — Z8679 Personal history of other diseases of the circulatory system: Secondary | ICD-10-CM | POA: Insufficient documentation

## 2014-06-28 DIAGNOSIS — I10 Essential (primary) hypertension: Secondary | ICD-10-CM | POA: Insufficient documentation

## 2014-06-28 DIAGNOSIS — I2510 Atherosclerotic heart disease of native coronary artery without angina pectoris: Secondary | ICD-10-CM | POA: Diagnosis not present

## 2014-06-28 DIAGNOSIS — Z87891 Personal history of nicotine dependence: Secondary | ICD-10-CM | POA: Diagnosis not present

## 2014-06-28 DIAGNOSIS — J449 Chronic obstructive pulmonary disease, unspecified: Secondary | ICD-10-CM | POA: Insufficient documentation

## 2014-06-28 HISTORY — PX: LEFT AND RIGHT HEART CATHETERIZATION WITH CORONARY ANGIOGRAM: SHX5449

## 2014-06-28 SURGERY — LEFT AND RIGHT HEART CATHETERIZATION WITH CORONARY ANGIOGRAM

## 2014-06-28 MED ORDER — SODIUM CHLORIDE 0.9 % IJ SOLN: 3.0000 mL | Freq: Two times a day (BID) | INTRAMUSCULAR | Status: DC

## 2014-06-28 MED ORDER — ASPIRIN 81 MG PO CHEW
CHEWABLE_TABLET | ORAL | Status: AC
  Administered 2014-06-28: 81 mg via ORAL
  Filled 2014-06-28: qty 1

## 2014-06-28 MED ORDER — ASPIRIN 81 MG PO CHEW
81.0000 mg | CHEWABLE_TABLET | ORAL | Status: AC
  Administered 2014-06-28: 81 mg via ORAL

## 2014-06-28 MED ORDER — VERAPAMIL HCL 2.5 MG/ML IV SOLN
INTRAVENOUS | Status: AC
  Filled 2014-06-28: qty 2

## 2014-06-28 MED ORDER — MIDAZOLAM HCL 2 MG/2ML IJ SOLN
INTRAMUSCULAR | Status: AC
  Filled 2014-06-28: qty 2

## 2014-06-28 MED ORDER — LIDOCAINE HCL (PF) 1 % IJ SOLN
INTRAMUSCULAR | Status: AC
  Filled 2014-06-28: qty 30

## 2014-06-28 MED ORDER — SODIUM CHLORIDE 0.9 % IV SOLN: 250.0000 mL | INTRAVENOUS | Status: DC | PRN

## 2014-06-28 MED ORDER — HEPARIN (PORCINE) IN NACL 2-0.9 UNIT/ML-% IJ SOLN
INTRAMUSCULAR | Status: AC
  Filled 2014-06-28: qty 1000

## 2014-06-28 MED ORDER — NITROGLYCERIN 1 MG/10 ML FOR IR/CATH LAB
INTRA_ARTERIAL | Status: AC
  Filled 2014-06-28: qty 10

## 2014-06-28 MED ORDER — SODIUM CHLORIDE 0.9 % IV SOLN: 1.0000 mL/kg/h | INTRAVENOUS | Status: DC

## 2014-06-28 MED ORDER — HEPARIN SODIUM (PORCINE) 1000 UNIT/ML IJ SOLN
INTRAMUSCULAR | Status: AC
  Filled 2014-06-28: qty 1

## 2014-06-28 MED ORDER — SODIUM CHLORIDE 0.9 % IV SOLN
INTRAVENOUS | Status: DC
  Administered 2014-06-28: 08:00:00 via INTRAVENOUS

## 2014-06-28 MED ORDER — SODIUM CHLORIDE 0.9 % IJ SOLN: 3.0000 mL | INTRAMUSCULAR | Status: DC | PRN

## 2014-06-28 MED ORDER — FENTANYL CITRATE 0.05 MG/ML IJ SOLN
INTRAMUSCULAR | Status: AC
  Filled 2014-06-28: qty 2

## 2014-06-28 NOTE — Interval H&P Note (Signed)
Cath Lab Visit (complete for each Cath Lab visit)  Clinical Evaluation Leading to the Procedure:   ACS: No.  Non-ACS:    Anginal Classification: CCS II  Anti-ischemic medical therapy: No Therapy  Non-Invasive Test Results: No non-invasive testing performed  Prior CABG: No previous CABG         History and Physical Interval Note:  06/28/2014 9:16 AM  Ronald Bell  has presented today for surgery, with the diagnosis of arotic stenosis  The various methods of treatment have been discussed with the patient and family. After consideration of risks, benefits and other options for treatment, the patient has consented to  Procedure(s): LEFT AND RIGHT HEART CATHETERIZATION WITH CORONARY ANGIOGRAM (N/A) as a surgical intervention .  The patient's history has been reviewed, patient examined, no change in status, stable for surgery.  I have reviewed the patient's chart and labs.  Questions were answered to the patient's satisfaction.     SKAINS, MARK

## 2014-06-28 NOTE — H&P (View-Only) (Signed)
Ronald Bell. [address] [place], [place]  [address] Phone: [phone] Fax:  [phone]  Date:  06/26/2014   ID:  Ronald Bell, Ronald Bell 05-31-1940, MRN [id number]  PCP:  [name], DO   History of Present Illness: Ronald Bell is a 74 y.o. male here for aortic stenosis follow up. He is had discussion with Dr. Roxy Manns and we would like to proceed with cardiac catheterization, left and right with valve area.   He has had significant peripheral vascular disease as described on previous cardiac catheterization report, sees Dr. Donnetta Hutching. 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 Left iliac artery has stent in place, approximately less than 50% stenosis based upon vascular study. He has had prior left carotid endarterectomy.  Recently underwent knee replacement. While at home, developed severe respiratory issues and was hospitalized for a day and a half he states. He is feeling much better.  Recent echocardiogram however demonstrated severe aortic stenosis as described below.  Dr. [name]. Smoking cessation. COPD. He has successfully quit smoking since that experience.   - LVEF 55-60%, normal wall thickness, borderline dilated LV, normal wall motion, diastolic dysfunction, likely elevated LV filling pressure, there is severe aortic stenosis, peak and mean gradients of 60 mmHG and 35 mmHg, respectively. AVA is around 0.8 cm2. Mild AI, severely dilated LA  Has a family history of heart disease with his brother and daughter. He carries diagnoses of hypertension and hyperlipidemia. Former Smoker quit in January 2016.  Hyperinflated lungs. Extensive pleural plaques noted. Coronary artery calcifications noted on CT scan 09/08/13 as well as aortic atherosclerosis.  Prior nuclear stress test 5 years ago showed no ischemia.  Wt Readings from Last 3 Encounters:  06/26/14 141 lb 6.4 oz (64.139 kg)  06/21/14 135 lb (61.236 kg)  06/04/14 136 lb 12.8 oz (62.052 kg)     Past Medical  History  Diagnosis Date  . Polyp of nasal cavity   . Rosacea   . Benign neoplasm of colon   . Tobacco abuse   . Psychosexual dysfunction with inhibited sexual excitement   . COPD   . Hypertension   . Hyperlipidemia   . GERD (gastroesophageal reflux disease)   . Anemia   . Arthritis   . GI bleed 2010    4 units PRBCs  . Adenomatous colon polyp   . Peripheral vascular disease   . Irregular heartbeat   . Shingles   . Carotid artery occlusion   . Angiodysplasia of intestine with hemorrhage   . Pleural plaque with presence of asbestos 03/27/2013    Followed in Pulmonary clinic/ [facility]/ [facility] - F/u CT 09/08/2013 1. Stable extensive calcified pleural plaque formation consistent with asbestos exposure. 2. Multiple pulmonary nodules are unchanged from the CT of 6 months ago. Given risk factors for lung cancer, continued follow up is recommended with chest CT in 6 months> done 04/20/14 no change >repeat in 12 m in tickle file     . COPD GOLD III with min reversibilty  08/11/2006    Followed in Pulmonary clinic/ [facility]/ [facility] - PFT's 04/28/2013  FEV1 0.88 (40%) with ratio 44 and 14% better p B2 dlco 45 corrects to 83 - Trial of breo 04/28/2013 > improved symptoms  06/09/2013  - spirometry 06/04/2014 FEV1  0.76 (29%) ratio 45      . GERD 11/30/2008    Qualifier: Diagnosis of  By: Marijean Niemann CMA, Danielle    . Iron deficiency anemia 01/25/2009  Qualifier: Diagnosis of  By: Henrene Pastor MD, Docia Chuck   . PVD (peripheral vascular disease) 10/18/2012  . Carotid artery stenosis 04/22/2012    Past Surgical History  Procedure Laterality Date  . Iliac artery stent Left 2005    CIA  . Esophagogastroduodenoscopy  2012    normal  . Colonoscopy  July 2015    Dr. Henrene Pastor  . Tonsillectomy    . Cardiac catheterization  2001  . Knee arthroscopy with medial menisectomy Left 03/08/2014    Procedure: LEFT KNEE SCOPE WITH MEDIAL MENISECTOMY AND CHONDROPLASTY;  Surgeon: Ninetta Lights, MD;  Location: [facility];   Service: Orthopedics;  Laterality: Left;    Current Outpatient Prescriptions  Medication Sig Dispense Refill  . amLODipine (NORVASC) 10 MG tablet TAKE 1 TABLET (10 MG TOTAL) BY MOUTH DAILY. 90 tablet 3  . Ascorbic Acid (VITAMIN C) 250 MG tablet Take 500 mg by mouth daily.     Marland Kitchen aspirin 81 MG EC tablet Take 81 mg by mouth daily.      Marland Kitchen atorvastatin (LIPITOR) 40 MG tablet Take 1 tablet (40 mg total) by mouth daily. 90 tablet 3  . docusate sodium (COLACE) 100 MG capsule Take 100 mg by mouth 2 (two) times daily.    . famotidine (PEPCID) 20 MG tablet One at bedtime 30 tablet 11  . fenofibrate 160 MG tablet TAKE ONE TABLET BY MOUTH ONCE DAILY 30 tablet 5  . Ferrous Sulfate (IRON) 325 (65 FE) MG TABS Take 1 tablet by mouth 2 (two) times daily. 90 each 3  . Fluticasone Furoate-Vilanterol (BREO ELLIPTA) 100-25 MCG/INH AEPB Inhale 1 puff into the lungs daily. 3 each 3  . guaiFENesin (MUCINEX) 600 MG 12 hr tablet Take 1 tablet (600 mg total) by mouth 2 (two) times daily as needed for cough or to loosen phlegm. 10 tablet 0  . Ipratropium-Albuterol (COMBIVENT RESPIMAT) 20-100 MCG/ACT AERS respimat Inhale 1 puff into the lungs 4 (four) times daily as needed for wheezing. 1 Inhaler 3  . Multiple Vitamin (MULTIVITAMIN WITH MINERALS) TABS tablet Take 1 tablet by mouth daily.    . Multiple Vitamins-Minerals (CENTRUM SILVER PO) Take 1 tablet by mouth daily.     . pantoprazole (PROTONIX) 40 MG tablet TAKE ONE TABLET BY MOUTH ONCE DAILY 90 tablet 3  . valsartan-hydrochlorothiazide (DIOVAN HCT) 160-12.5 MG per tablet Take 1 tablet by mouth daily. 90 tablet 3  . VOLTAREN 1 % GEL Apply 2 g topically 4 (four) times daily as needed (for arthritis).      No current facility-administered medications for this visit.    Allergies:   No Known Allergies  Social History:  The patient  reports that he quit smoking about 2 months ago. His smoking use included Cigarettes and E-cigarettes. He has a 41.25 pack-year smoking  history. He has never used smokeless tobacco. He reports that he drinks about 2.4 oz of alcohol per week. He reports that he does not use illicit drugs.   Family History  Problem Relation Age of Onset  . Lung disease Mother     pulm fibrosis  . Uterine cancer    . Colitis Father   . Colon cancer Neg Hx   . Heart disease Brother   . Hypertension Brother   . Hyperlipidemia Brother   . Heart disease Daughter     cad  . Hypertension Son     ROS:  Please see the history of present illness.   Positive for knee pain, left. He denies  any syncope, bleeding, orthopnea, PND, anginal symptoms. He has baseline shortness of breath with COPD. This has not increased recently. No strokelike symptoms, Dr. Donnetta Hutching.   All other systems reviewed and negative.   PHYSICAL EXAM: VS:  BP 122/58 mmHg  Pulse 94  Ht 5\' 3"  (1.6 m)  Wt 141 lb 6.4 oz (64.139 kg)  BMI 25.05 kg/m2 Well nourished, well developed, in no acute distressUsing a cane HEENT: normal, [place]/AT, EOMI Neck: no JVD, normal carotid upstroke, Bilat bruit, loud Cardiac:  normal S1, S2; RRR; 3/6 systolic right upper sternal border murmur Lungs:  Diminished breath sounds bilaterally however clear to auscultation bilaterally, no wheezing, rhonchi or rales Abd: soft, nontender, no hepatomegaly, no bruits Ext: no edema, 2+ distal pulses Skin: warm and dry GU: deferred Neuro: no focal abnormalities noted, AAO x 3  EKG:    03/05/14-sinus rhythm, 84, no other significant abnormalities noted. Echocardiogram: 04/06/14 - LVEF 55-60%, normal wall thickness, borderline dilated LV, normal wall motion, diastolic dysfunction, likely elevated LV filling pressure, there is severe aortic stenosis, peak and mean gradients of 60 mmHG and 35 mmHg, respectively. AVA is around 0.8 cm2. Mild AI, severely dilated LA  November/2013-normal ejection fraction 65% with moderate aortic stenosis 3.4 m/s, 28 mmHg mean gradient. Mild aortic valve regurgitation.  Nuclear stress  test 02/2011-low risk, no ischemia   Cardiac catheterization 2001- showed diffuse 40% stenosis of LAD, heavily calcified in midportion, circumflex calcification, right coronary artery 30-45% in the mid and proximal segment, subclavian 30-40% stenosis left as well as calcification of the ascending arch. Left internal iliac 50-60% stenosis, right internal iliac occluded, fed by collateral flow. 50% left renal artery stenosis.  ASSESSMENT AND PLAN:  1. Severe aortic stenosis- Dr. Roxy Manns has seen in consultation.  With his COPD, TAVR. He does have fairly significant peripheral vascular disease however. Please see catheterization report from 2001 showing completely occluded right internal iliac artery, left common iliac revealed 40% proximal stenosis. Diffuse atherosclerotic changes throughout distal aorta. I think that this would prohibit potential femoral artery approach.? 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 Diagnostic coronary angiogram, left and right heart catheterization discussed via the radial/brachial artery approach. We will obtain aortic valve area as well as gradient crossing the valve. Right-sided heart pressures will be obtained as well. Risks and benefits of procedure have been discussed including stroke, heart attack, death, bleeding, renal impairment. I will ask him to hold his angiotensin receptor blocker/HCTZ one day prior to procedure. We will hydrate.Marland Kitchen He could have advancement of his coronary artery disease which previously was described as calcified but non-flow limiting. His recent hospitalization was secondary to respiratory difficulty. 2. Carotid artery disease-Dr. Donnetta Hutching. Doing well. Left CEA. 3. Essential hypertension-currently well controlled. Medications reviewed. 4. Hyperlipidemia-excellent use of statin with his peripheral vascular disease. 5. COPD-stable. Seeing Dr. [name] soon. 6. Six-week follow-up. (possibly post op at that time.)  Signed, Candee Furbish, MD [facility]  06/26/2014 10:14 AM

## 2014-06-28 NOTE — CV Procedure (Signed)
CARDIAC CATHETERIZATION  PROCEDURE:  Right and left heart catheterization with selective coronary angiography, left ventriculogram, cardiac output, selective oxygen saturations, right subclavian angiogram.  INDICATIONS:  74 year old with COPD, severe aortic stenosis, severe peripheral vascular disease, occluded right iliac, stented left iliac, moderate carotid bilaterally, left subclavian stenosis, porcelain aorta here for preoperative angiogram, aortic valve evaluation, right heart catheterization.  The risks, benefits, and details of the procedure were explained to the patient, including stroke, heart attack, death, renal impairment, arterial damage, bleeding.  The patient verbalized understanding and wanted to proceed.  Informed written consent was obtained.  PROCEDURE TECHNIQUE:  A brachial IV was placed in the antecubital position. Lidocaine was administered surrounding brachial IV as well as radial artery site. A guidewire was placed through the brachial IV without difficulty. A 5 French short brachial sheath was then placed in the vein without difficulty under fluoroscopy. A 5 French balloontipped PA catheter was then inserted without difficulty and traversed through right-sided heart chambers to the wedge position. Multiple pressures obtained. Oxygen saturation in the pulmonary artery obtained. Catheter was then removed. Focus was then placed on the radial artery site where using the modified Seldinger technique a 5 French glide sheath was placed in the radial artery without difficulty. Verapamil 3 mg was administered. J-wire successfully traversed up to the subclavian origin and a versa core wire was then utilized to traverse to the aortic valve position. A right subclavian angiogram was performed. Right Judkins 4 catheter was carefully positioned using fluoroscopy. Right coronary angiography was done using a Judkins R4 catheter. Multiple views with hand injection of Omnipaque were obtained.  This catheter was then utilized to traverse the aortic valve. After unsuccessful attempt with J-wire, a straight wire was then utilized and successfully cross the aortic valve. Pressures were obtained prior to crossing in the aortic position and then post crossing in the LV position. A hand injected left ventriculogram was performed under fluoroscopy. Pullback was then obtained. Careful measurements of transvalvular pressures were performed. This catheter was then exchanged over the J-wire for a Judkins left 3.5 catheter which successfully cannulated the left main artery. Multiple views with hand injection were obtained under fluoroscopy of the left system. This catheter was then removed over the wire and she's were then removed utilizing Terumo T band and pressure. Findings were discussed with he and family.    CONTRAST:  Total of 90 ml.  COMPLICATIONS:  None.    HEMODYNAMICS:    Right atrium (RA): 12/10/8 mmHg Right ventricle (RV): 41/3/10 mmHg Pulmonary artery (PA): 39/17/26 mmHg Pulmonary capillary wedge pressure (PCWP): 17/14/14 mmHg  Cardiac output: Fick 4.87 liters/min Cardiac index: Fick 2.99  PA saturation: 67% FA saturation: 96%   Aortic pressure: 148/47 mmHg LV pressure: 161 mmHg systolic; LVEDP 17 mmHg.    Peak to peak gradient between the left ventricle and aorta was 45mmHg, mean 41.62mmHg, Calculated valve area 0.67cm squared.    ANGIOGRAPHIC DATA:    Left main: Calcified vessel branching into circumflex as well as LAD, no significant stenosis.  Left anterior descending (LAD): Densely calcified vessel with severe proximal/mid LAD stenosis surrounding major septal branch of approximately 90% at its highest, continuing to large diagonal branch. Disease length is approximately 15-20 mm. Major diagonal branch also has 70% proximal stenosis.  Circumflex artery (CIRC): In the RAO caudal view there appears to be ostial/proximal circumflex disease, perhaps 90% stenosis. This  vessel is also densely calcified and is fairly small in caliber distally. There are 2 small  obtuse marginal branches.  Right coronary artery (RCA): This vessel is also densely calcified. There is a significant 90% stenosis in the mid RCA. There is also 50% stenosis in the proximal RCA at the first major bend of the vessel. This vessel is dominant giving rise to the posterior descending artery. It is fairly large in caliber.  LEFT VENTRICULOGRAM:  Left ventricular angiogram was done in the 30 RAO projection with hand injection and revealed normal left ventricular wall motion and systolic function with an estimated ejection fraction of 65%.   IMPRESSIONS:  1. Severe triple-vessel coronary artery disease encompassing the proximal/mid LAD, ostial/proximal circumflex, mid RCA. All vessels are densely calcified. 2. Normal left ventricular systolic function.  LVEDP 17 mmHg.  Ejection fraction 65%. 3. Mildly elevated right-sided heart pressures. (Pulmonary artery systolic 39 mmHg-history of COPD) 4. Normal cardiac output. 5.  Severe aortic stenosis-calculated valve area 0.67 cm with a mean gradient of 41 mmHg, peak to peak gradient of 44 mmHg. 6.  Severe peripheral vascular disease-densely calcified carotid arteries noted on fluoroscopy as well as calcified right subclavian artery as well as brachiocephalic artery. There is a napkin ring, ridge of calcium in the mid brachiocephalic that necessitated versa core wire. The ascending and proximal distal aorta demonstrates severe calcification/atherosclerosis.  RECOMMENDATION:  We will forward results to Dr. Roxy Manns and Dr. Julianne Handler. Obviously his coronary anatomy as well as porcelain aorta and severe aortic stenosis make for a challenging situation. We will discuss all options.  Candee Furbish, MD

## 2014-06-28 NOTE — Discharge Instructions (Signed)
Radial Site Care °Refer to this sheet in the next few weeks. These instructions provide you with information on caring for yourself after your procedure. Your caregiver may also give you more specific instructions. Your treatment has been planned according to current medical practices, but problems sometimes occur. Call your caregiver if you have any problems or questions after your procedure. °HOME CARE INSTRUCTIONS °· You may shower the day after the procedure. Remove the bandage (dressing) and gently wash the site with plain soap and water. Gently pat the site dry. °· Do not apply powder or lotion to the site. °· Do not submerge the affected site in water for 3 to 5 days. °· Inspect the site at least twice daily. °· Do not flex or bend the affected arm for 24 hours. °· No lifting over 5 pounds (2.3 kg) for 5 days after your procedure. °· Do not drive home if you are discharged the same day of the procedure. Have someone else drive you. °· You may drive 24 hours after the procedure unless otherwise instructed by your caregiver. °· Do not operate machinery or power tools for 24 hours. °· A responsible adult should be with you for the first 24 hours after you arrive home. °What to expect: °· Any bruising will usually fade within 1 to 2 weeks. °· Blood that collects in the tissue (hematoma) may be painful to the touch. It should usually decrease in size and tenderness within 1 to 2 weeks. °SEEK IMMEDIATE MEDICAL CARE IF: °· You have unusual pain at the radial site. °· You have redness, warmth, swelling, or pain at the radial site. °· You have drainage (other than a small amount of blood on the dressing). °· You have chills. °· You have a fever or persistent symptoms for more than 72 hours. °· You have a fever and your symptoms suddenly get worse. °· Your arm becomes pale, cool, tingly, or numb. °· You have heavy bleeding from the site. Hold pressure on the site and call 911. °Document Released: 04/11/2010 Document  Revised: 06/01/2011 Document Reviewed: 04/11/2010 °Exit[employer]® Patient Information ©2015 [employer]. This information is not intended to replace advice given to you by your health care provider. Make sure you discuss any questions you have with your health care provider. ° °

## 2014-06-29 LAB — POCT I-STAT 3, ART BLOOD GAS (G3+)
Acid-base deficit: 6 mmol/L — ABNORMAL HIGH (ref 0.0–2.0)
Bicarbonate: 19.4 meq/L — ABNORMAL LOW (ref 20.0–24.0)
O2 Saturation: 94 %
TCO2: 21 mmol/L (ref 0–100)
pCO2 arterial: 37.8 mmHg (ref 35.0–45.0)
pH, Arterial: 7.319 — ABNORMAL LOW (ref 7.350–7.450)
pO2, Arterial: 78 mmHg — ABNORMAL LOW (ref 80.0–100.0)

## 2014-06-29 LAB — POCT I-STAT 3, VENOUS BLOOD GAS (G3P V)
Acid-base deficit: 2 mmol/L (ref 0.0–2.0)
Bicarbonate: 22.6 meq/L (ref 20.0–24.0)
O2 Saturation: 67 %
TCO2: 24 mmol/L (ref 0–100)
pCO2, Ven: 38.7 mmHg — ABNORMAL LOW (ref 45.0–50.0)
pH, Ven: 7.375 — ABNORMAL HIGH (ref 7.250–7.300)
pO2, Ven: 36 mmHg (ref 30.0–45.0)

## 2014-07-01 ENCOUNTER — Other Ambulatory Visit: Payer: Self-pay | Admitting: Internal Medicine

## 2014-07-02 ENCOUNTER — Encounter: Payer: Self-pay | Admitting: Cardiovascular Disease

## 2014-07-02 ENCOUNTER — Ambulatory Visit (INDEPENDENT_AMBULATORY_CARE_PROVIDER_SITE_OTHER): Payer: Medicare Other | Admitting: Cardiovascular Disease

## 2014-07-02 ENCOUNTER — Ambulatory Visit (HOSPITAL_COMMUNITY): Payer: Medicare Other

## 2014-07-02 VITALS — BP 130/56 | HR 86 | Ht 63.0 in | Wt 142.0 lb

## 2014-07-02 DIAGNOSIS — I350 Nonrheumatic aortic (valve) stenosis: Secondary | ICD-10-CM

## 2014-07-02 DIAGNOSIS — I2510 Atherosclerotic heart disease of native coronary artery without angina pectoris: Secondary | ICD-10-CM | POA: Diagnosis not present

## 2014-07-02 DIAGNOSIS — I2583 Coronary atherosclerosis due to lipid rich plaque: Secondary | ICD-10-CM

## 2014-07-02 NOTE — Progress Notes (Signed)
Chief Complaint  Patient presents with  . Shortness of Breath    History of Present Illness: 74 yo male with history of aortic stenosis, severe COPD, peripheral vascular disease, hyperlipidemia, HTN and anemia with prior GI bleeding who is referred today for consultation regarding his aortic valve disease. He has been followed closely over the past year by Dr. Candee Furbish with known aortic valve stenosis. He has recently experienced dyspnea with exertion following left knee arthroscopy in December 2015. Follow up echocardiogram January 2016 with severe aortic stenosis with mean gradient of 34 mm Hg and peak gradient of 60 mm Hg. Aortic valve area was estimated at 0.8 cm2. LV function was normal with LVEF=55%. He was hospitalized in late January 2016 with hypoxic respiratory failure felt to be secondary to COPD exacerbation and was treated with bronchodilators, antibiotics and steroids. He was seen for hospital follow up Dr. Marlou Porch and was referred for pulmonary evaluation with Dr. [name] who felt that a component of his respiratory issues was likely due to congestive heart failure. He was then seen in the CT surgery office by Dr. Roxy Manns 06/21/14 for further evaluation of his aortic stenosis. He has since undergone a right and left heart catheterization per Dr. Marlou Porch on June 28, 2014 and was found to have heavily calcified coronary arteries with severe disease in the mid LAD, mid RCA and moderately severe disease in the ostium of the Circumflex. Prior imaging confirms presence of severe calcification of the thoracic aorta. He has known PAD with occluded right iliac artery and moderate left iliac artery stenosis with prior stent placement. He has history of GI bleeding and was found to have angiodysplasia 7 years ago. He has been followed by GI and takes iron daily but no recent GI bleeding on ASA therapy. He smoked for many years and quit three months ago.He has stable COPD and does not require supplemental O2  therapy.   He has been a very functional person until recently. He is retired. He is now limited by dyspnea with minimal exertion. He can only walk for 20 feet before becoming dyspneic. He has no chest pain.  He has no dizziness, near syncope or syncope. No lower extremity edema. Weight has been stable. He is concerned today about his wife and her recent illness which may require surgery.   Primary Care Physician: Etter Sjogren  Past Medical History  Diagnosis Date  . Polyp of nasal cavity   . Rosacea   . Benign neoplasm of colon   . Tobacco abuse   . Psychosexual dysfunction with inhibited sexual excitement   . COPD   . Hypertension   . Hyperlipidemia   . GERD (gastroesophageal reflux disease)   . Anemia   . Arthritis   . GI bleed 2010    4 units PRBCs  . Adenomatous colon polyp   . Peripheral vascular disease   . Irregular heartbeat   . Shingles   . Carotid artery occlusion   . Angiodysplasia of intestine with hemorrhage   . Pleural plaque with presence of asbestos 03/27/2013    Followed in Pulmonary clinic/ [facility]/ [place] - F/u CT 09/08/2013 1. Stable extensive calcified pleural plaque formation consistent with asbestos exposure. 2. Multiple pulmonary nodules are unchanged from the CT of 6 months ago. Given risk factors for lung cancer, continued follow up is recommended with chest CT in 6 months> done 04/20/14 no change >repeat in 12 m in tickle file     . COPD GOLD  III with min reversibilty  08/11/2006    Followed in Pulmonary clinic/ [facility]/ [place] - PFT's 04/28/2013  FEV1 0.88 (40%) with ratio 44 and 14% better p B2 dlco 45 corrects to 83 - Trial of breo 04/28/2013 > improved symptoms  06/09/2013  - spirometry 06/04/2014 FEV1  0.76 (29%) ratio 45      . GERD 11/30/2008    Qualifier: Diagnosis of  By: Marijean Niemann CMA, Danielle    . Iron deficiency anemia 01/25/2009    Qualifier: Diagnosis of  By: Henrene Pastor MD, Docia Chuck   . PVD (peripheral vascular disease) 10/18/2012  . Carotid artery  stenosis 04/22/2012    Past Surgical History  Procedure Laterality Date  . Iliac artery stent Left 2005    CIA  . Esophagogastroduodenoscopy  2012    normal  . Colonoscopy  July 2015    Dr. Henrene Pastor  . Tonsillectomy    . Cardiac catheterization  2001  . Knee arthroscopy with medial menisectomy Left 03/08/2014    Procedure: LEFT KNEE SCOPE WITH MEDIAL MENISECTOMY AND CHONDROPLASTY;  Surgeon: Ninetta Lights, MD;  Location: [place];  Service: Orthopedics;  Laterality: Left;  . Left and right heart catheterization with coronary angiogram N/A 06/28/2014    Procedure: LEFT AND RIGHT HEART CATHETERIZATION WITH CORONARY ANGIOGRAM;  Surgeon: Jerline Pain, MD;  Location: [facility] CATH LAB;  Service: Cardiovascular;  Laterality: N/A;    Current Outpatient Prescriptions  Medication Sig Dispense Refill  . amLODipine (NORVASC) 10 MG tablet TAKE 1 TABLET (10 MG TOTAL) BY MOUTH DAILY. 90 tablet 3  . Ascorbic Acid (VITAMIN C) 250 MG tablet Take 500 mg by mouth daily.     Marland Kitchen aspirin 81 MG EC tablet Take 81 mg by mouth daily.      Marland Kitchen atorvastatin (LIPITOR) 40 MG tablet Take 1 tablet (40 mg total) by mouth daily. 90 tablet 3  . BREO ELLIPTA 100-25 MCG/INH AEPB INHALE ONE PUFF INTO LUNGS ONCE DAILY IN THE MORNING 60 each 2  . docusate sodium (COLACE) 100 MG capsule Take 100 mg by mouth 2 (two) times daily.    . famotidine (PEPCID) 20 MG tablet One at bedtime (Patient taking differently: Take 20 mg by mouth at bedtime. One at bedtime) 30 tablet 11  . fenofibrate 160 MG tablet TAKE ONE TABLET BY MOUTH ONCE DAILY 30 tablet 5  . Ferrous Sulfate (IRON) 325 (65 FE) MG TABS Take 1 tablet by mouth 2 (two) times daily. 90 each 3  . guaiFENesin (MUCINEX) 600 MG 12 hr tablet Take 1 tablet (600 mg total) by mouth 2 (two) times daily as needed for cough or to loosen phlegm. 10 tablet 0  . Ipratropium-Albuterol (COMBIVENT RESPIMAT) 20-100 MCG/ACT AERS respimat Inhale 1 puff into the lungs 4 (four) times daily as needed for wheezing.  1 Inhaler 3  . Multiple Vitamin (MULTIVITAMIN WITH MINERALS) TABS tablet Take 1 tablet by mouth daily.    . Multiple Vitamins-Minerals (CENTRUM SILVER PO) Take 1 tablet by mouth daily.     . pantoprazole (PROTONIX) 40 MG tablet TAKE ONE TABLET BY MOUTH ONCE DAILY 90 tablet 3  . valsartan-hydrochlorothiazide (DIOVAN HCT) 160-12.5 MG per tablet Take 1 tablet by mouth daily. 90 tablet 3  . VOLTAREN 1 % GEL Apply 2 g topically 4 (four) times daily as needed (for arthritis).      No current facility-administered medications for this visit.    No Known Allergies  History   Social History  . Marital Status:  Married    Spouse Name: N/A  . Number of Children: 4  . Years of Education: N/A   Occupational History  . Charity fundraiser    Social History Main Topics  . Smoking status: Former Smoker -- 0.75 packs/day for 55 years    Types: Cigarettes, E-cigarettes    Quit date: 04/17/2014  . Smokeless tobacco: Never Used     Comment: pt states he knows he needs to quit. Also uses e-cig.  Marland Kitchen Alcohol Use: 2.4 oz/week    4 Glasses of wine per week     Comment: occasional  . Drug Use: No  . Sexual Activity:    Partners: Female   Other Topics Concern  . Not on file   Social History Narrative    Family History  Problem Relation Age of Onset  . Lung disease Mother     pulm fibrosis  . Uterine cancer    . Colitis Father   . Colon cancer Neg Hx   . Heart disease Brother   . Hypertension Brother   . Hyperlipidemia Brother   . Heart disease Daughter     cad  . Hypertension Son     Review of Systems:  As stated in the HPI and otherwise negative.   BP 130/56 mmHg  Pulse 86  Ht 5\' 3"  (1.6 m)  Wt 142 lb (64.411 kg)  BMI 25.16 kg/m2  Physical Examination: General: Well developed, well nourished, NAD HEENT: OP clear, mucus membranes moist SKIN: warm, dry. No rashes. Neuro: No focal deficits Musculoskeletal: Muscle strength 5/5 all ext Psychiatric: Mood and affect  normal Neck: No JVD, no carotid bruits, no thyromegaly, no lymphadenopathy. Lungs:Clear bilaterally, no wheezes, rhonci, crackles Cardiovascular: Regular rate and rhythm. Harsh systolic murmur. No gallops or rubs. Abdomen:Soft. Bowel sounds present. Non-tender.  Extremities: No lower extremity edema. Left femoral artery pulse is 1+. No right femoral artery pulse.   Cardiac cath 06/28/14: HEMODYNAMICS:  Right atrium (RA): 12/10/8 mmHg Right ventricle (RV): 41/3/10 mmHg Pulmonary artery (PA): 39/17/26 mmHg Pulmonary capillary wedge pressure (PCWP): 17/14/14 mmHg Cardiac output: Fick 4.87 liters/min Cardiac index: Fick 2.99 PA saturation: 67% FA saturation: 96% Aortic pressure: 148/47 mmHg LV pressure: 093 mmHg systolic; LVEDP 17 mmHg.  Peak to peak gradient between the left ventricle and aorta was 68mmHg, mean 41.60mmHg, Calculated valve area 0.67cm squared.  ANGIOGRAPHIC DATA:  Left main: Calcified vessel branching into circumflex as well as LAD, no significant stenosis. Left anterior descending (LAD): Densely calcified vessel with severe proximal/mid LAD stenosis surrounding major septal branch of approximately 90% at its highest, continuing to large diagonal branch. Disease length is approximately 15-20 mm. Major diagonal branch also has 70% proximal stenosis. Circumflex artery (CIRC): In the RAO caudal view there appears to be ostial/proximal circumflex disease, perhaps 90% stenosis. This vessel is also densely calcified and is fairly small in caliber distally. There are 2 small obtuse marginal branches. Right coronary artery (RCA): This vessel is also densely calcified. There is a significant 90% stenosis in the mid RCA. There is also 50% stenosis in the proximal RCA at the first major bend of the vessel. This vessel is dominant giving rise to the posterior descending artery. It is fairly large in caliber. LEFT VENTRICULOGRAM: Left ventricular angiogram was done in the 30 RAO  projection with hand injection and revealed normal left ventricular wall motion and systolic function with an estimated ejection fraction of 65%.   Echo 04/06/14: - Left ventricle: The cavity size was is borderline dilated.  Wall thickness was normal. Systolic function was normal. The estimated ejection fraction was in the range of 55% to 60%. Wall motion was normal; there were no regional wall motion abnormalities. Doppler parameters are consistent with abnormal left ventricular relaxation (grade 1 diastolic dysfunction). The E/e&' ratio is between 8-15, suggesting indeterminate LV filling pressure. - Aortic valve: Heavily calcified aortic valve leaflets. Peak and mean gradients of 60 and 34 mmHg, respectively. Based on an LVOT diameter of 2.0 cm, the calculated AVA is 0.8 cm2, consistent with severe aortic stenosis. There is mild regurgitation. Valve area (VTI): 0.74 cm^2. Valve area (Vmean): 0.68 cm^2. - Mitral valve: Calcified annulus. Mildly thickened leaflets . There was trivial regurgitation. - Left atrium: Severely dilated at 49 ml/m2.  Impressions:  - LVEF 55-60%, normal wall thickness, borderline dilated LV, normal wall motion, diastolic dysfunction, likely elevated LV filling pressure, there is severe aortic stenosis, peak and mean gradients of 60 mmHG and 35 mmHg, respectively. AVA is around 0.8 cm2. Mild AI, severely dilated LA  EKG:  EKG is not ordered today. The ekg ordered today demonstrates  Recent Labs: 05/31/2014: B Natriuretic Peptide 75.7 06/01/2014: ALT 21; TSH 1.206 06/26/2014: BUN 19; Creatinine 0.95; Hemoglobin 11.3*; Platelets 286.0; Potassium 4.5; Sodium 133*   Lipid Panel    Component Value Date/Time   CHOL 154 05/10/2014 1124   TRIG 125.0 05/10/2014 1124   TRIG 102 02/26/2006 1044   HDL 55.40 05/10/2014 1124   CHOLHDL 3 05/10/2014 1124   CHOLHDL 2.8 CALC 02/26/2006 1044   VLDL 25.0 05/10/2014 1124   LDLCALC 74  05/10/2014 1124     Wt Readings from Last 3 Encounters:  07/02/14 142 lb (64.411 kg)  06/28/14 135 lb (61.236 kg)  06/26/14 141 lb 6.4 oz (64.139 kg)     Other studies Reviewed: Additional studies/ records that were reviewed today include: Cardiac cath, echo, CT chest, EKG  Assessment and Plan:   1. Severe aortic valve stenosis: He has stage D severe symptomatic aortic stenosis. His dyspnea is likely multi-factorial with underlying lung disease, AS and three vessel CAD. I have personally reviewed his echo images and agree that his aortic valve is heavily calcified with limited leaflet mobility. His LV function is normal. His aortic valve disease is most certainly contributing at least in part to his symptoms. Slight elevation pulmonary pressures by cath. He will be high risk for traditional AVR given his age, underlying lung disease and presence of a porcelain aorta. He will also be high risk for TAVR given his comorbidities. He has not yet undergone the CTA of chest/abdomen/pelvis which will define access options for TAVR but I suspect his femoral access will be limited with his diffuse vascular disease with notation of probable occlusion right common iliac artery and prior stenting of the left iliac artery. He has had f/u in the Vascular surgery office with Dr. Donnetta Hutching and ABI reported as normal in the right leg. I see no formal reports documenting occlusion of the right iliac artery system. The iliac system will be visualized with the CTA. The trans-aortic approach will not be optimal given his calcified aorta. Will have to consider trans-apical approach for TAVR but this is pending the results from the CT scan. Will also need cardiac CT which will be arranged. All of the TAVR planning with center around definitive treatment of his severe CAD.   2. Severe triple vessel CAD: He has triple vessel CAD by recent cath with heavily calcified coronary arteries. There is severe  focal disease in the mid LAD,  mid RCA and moderately severe disease in the ostium of the Circumflex. I have reviewed his cath images personally. I have discussed this with Dr. Roxy Manns today. He is not an optimal candidate for CABG for revascularization given the porcelain aorta and severe COPD. I have discussed PCI of the LAD and RCA which will require rotablator atherectomy of both vessel given the severe calcification. PCI and stent placement is potentially complicated by bleeding with his history of angiodysplasia but he has had no recent GI bleeding and has been released by GI. Will consider placement of bare metal stents to shorten the duration of anti-platelet therapy. Arterial access will be in the left femoral artery as his right iliac is occluded. He tellls me that his wife is ill and he wishes to wait on the PCI after she is doing better. She may need GI surgery for ? Rectovaginal fistula. He will call back to schedule the PCI in several weeks. In the meantime, we may be able to get his CT scans completed. Renal function is normal.   Current medicines are reviewed at length with the patient today.  The patient does not have concerns regarding medicines.  The following changes have been made:  no change  Labs/ tests ordered today include:  No orders of the defined types were placed in this encounter.    Disposition:   He will call back to arrange cath/PCI. He will need pre-cath labs the week of the procedure.     Signed, Lauree Chandler, MD 07/02/2014 2:41 PM    [facility] [place], [place], [place]  [address] Phone: [phone]; Fax: [phone]

## 2014-07-04 ENCOUNTER — Ambulatory Visit: Payer: Medicare Other | Attending: Thoracic Surgery (Cardiothoracic Vascular Surgery) | Admitting: Physical Therapy

## 2014-07-04 ENCOUNTER — Encounter: Payer: Self-pay | Admitting: Physical Therapy

## 2014-07-04 DIAGNOSIS — R293 Abnormal posture: Secondary | ICD-10-CM | POA: Insufficient documentation

## 2014-07-04 DIAGNOSIS — R0602 Shortness of breath: Secondary | ICD-10-CM | POA: Insufficient documentation

## 2014-07-04 DIAGNOSIS — R269 Unspecified abnormalities of gait and mobility: Secondary | ICD-10-CM | POA: Diagnosis not present

## 2014-07-04 NOTE — Therapy (Signed)
[place], [place], [address] Phone: [phone]   Fax:  [phone]  Physical Therapy Evaluation  Patient Details  Name: Ronald Bell MRN: [id number] Date of Birth: 19-Jan-1941 Referring Provider:  [name], MD  Encounter Date: 07/04/2014      PT End of Session - 07/04/14 1131    Visit Number 1   PT Start Time 1050   PT Stop Time 1130   PT Time Calculation (min) 40 min      Past Medical History  Diagnosis Date  . Polyp of nasal cavity   . Rosacea   . Benign neoplasm of colon   . Tobacco abuse   . Psychosexual dysfunction with inhibited sexual excitement   . COPD   . Hypertension   . Hyperlipidemia   . GERD (gastroesophageal reflux disease)   . Anemia   . Arthritis   . GI bleed 2010    4 units PRBCs  . Adenomatous colon polyp   . Peripheral vascular disease   . Irregular heartbeat   . Shingles   . Carotid artery occlusion   . Angiodysplasia of intestine with hemorrhage   . Pleural plaque with presence of asbestos 03/27/2013    Followed in Pulmonary clinic/ [facility]/ [place] - F/u CT 09/08/2013 1. Stable extensive calcified pleural plaque formation consistent with asbestos exposure. 2. Multiple pulmonary nodules are unchanged from the CT of 6 months ago. Given risk factors for lung cancer, continued follow up is recommended with chest CT in 6 months> done 04/20/14 no change >repeat in 12 m in tickle file     . COPD GOLD III with min reversibilty  08/11/2006    Followed in Pulmonary clinic/ [facility]/ [place] - PFT's 04/28/2013  FEV1 0.88 (40%) with ratio 44 and 14% better p B2 dlco 45 corrects to 83 - Trial of breo 04/28/2013 > improved symptoms  06/09/2013  - spirometry 06/04/2014 FEV1  0.76 (29%) ratio 45      . GERD 11/30/2008    Qualifier: Diagnosis of  By: Marijean Niemann CMA, Danielle    . Iron deficiency anemia 01/25/2009    Qualifier: Diagnosis of  By: Henrene Pastor MD, Docia Chuck   . PVD  (peripheral vascular disease) 10/18/2012  . Carotid artery stenosis 04/22/2012    Past Surgical History  Procedure Laterality Date  . Iliac artery stent Left 2005    CIA  . Esophagogastroduodenoscopy  2012    normal  . Colonoscopy  July 2015    Dr. Henrene Pastor  . Tonsillectomy    . Cardiac catheterization  2001  . Knee arthroscopy with medial menisectomy Left 03/08/2014    Procedure: LEFT KNEE SCOPE WITH MEDIAL MENISECTOMY AND CHONDROPLASTY;  Surgeon: Ninetta Lights, MD;  Location: [facility];  Service: Orthopedics;  Laterality: Left;  . Left and right heart catheterization with coronary angiogram N/A 06/28/2014    Procedure: LEFT AND RIGHT HEART CATHETERIZATION WITH CORONARY ANGIOGRAM;  Surgeon: Jerline Pain, MD;  Location: [facility] CATH LAB;  Service: Cardiovascular;  Laterality: N/A;    There were no vitals filed for this visit.  Visit Diagnosis:  Abnormality of gait - Plan: PT plan of care cert/re-cert  Shortness of breath - Plan: PT plan of care cert/re-cert  Abnormal posture - Plan: PT plan of care cert/re-cert      Subjective Assessment - 07/04/14 1052    Subjective has had SOB for a while and pt had attributed it to his COPD, went  to pulmonologist 3-4 weeks ago and he referred to cardiologist   Patient Stated Goals fix heart problem   Currently in Pain? No/denies            [facility] PT Assessment - 07/04/14 0001    Assessment   Medical Diagnosis severe aortic stenosis   Onset Date 06/03/14  approximate   Precautions   Precautions None   Restrictions   Weight Bearing Restrictions No   Balance Screen   Has the patient fallen in the past 6 months No   Has the patient had a decrease in activity level because of a fear of falling?  No   Is the patient reluctant to leave their home because of a fear of falling?  No   Home Environment   Living Enviornment Private residence   Living Arrangements Spouse/significant other   Type of [place] Level entry    [place] One level   Prior Function   Level of Independence Independent with basic ADLs;Independent with gait   Posture/Postural Control   Posture/Postural Control Postural limitations   Postural Limitations Forward head;Rounded Shoulders  mild   AROM   Overall AROM Comments grossly WNL   Strength   Overall Strength Comments grossly 5/5 throughout   Strength Assessment Site Hand   Right/Left hand Right;Left   Grip (lbs) R 50   Grip (lbs) L 60  L hand dominant   Ambulation/Gait   Gait Comments No significant gait deviations noted, SOB limited          [facility] Pre-Surgical Assessment - 07/04/14 0001    5 Meter Walk Test- trial 1 4 sec   5 Meter Walk Test- trial 2 4 sec.    5 Meter Walk Test- trial 3 4 sec.  </= 6 sec WNL   5 meter walk test average 4 sec   Timed Up & Go Test trial  10 sec.   Comments </= 12 sec WNL   4 Stage Balance Test tolerated for:  10 sec.   4 Stage Balance Test Position 2   comment Inability to maintain position 3 x 10 seconds is indicative of high fall risk   Sit To Stand Test- trial 1 14 sec.   Comment </= 12.6 WNL   ADL/IADL Independent with: Bathing;Dressing;Meal prep;Finances   ADL/IADL Needs Assistance with: [place] work  able to [employer] yard with frequent rests   ADL/IADL Fraility Index Vulnerable   6 Minute Walk- Baseline yes   BP (mmHg) 130/56 mmHg   HR (bpm) 88   02 Sat (%RA) 97 %   Modified Borg Scale for Dyspnea 0- Nothing at all   Perceived Rate of Exertion (Borg) 6-   6 Minute Walk Post Test yes   BP (mmHg) 132/64 mmHg   HR (bpm) 105   02 Sat (%RA) 92 %   Modified Borg Scale for Dyspnea 3- Moderate shortness of breath or breathing difficulty   Perceived Rate of Exertion (Borg) 12-   Aerobic Endurance Distance Walked 843   Endurance additional comments Pt required 1 seated rest break - 94% O2, 105 bpm, 3:00, 539', Modified Borg for dyspnea 3/10, seated rest x 1:25 then able to resume                         PT  Education - 07/04/14 1134    Education provided Yes   Education Details ideas for balance exercises at home -  tandem and SLS at counter for safety   Person(s) Educated Patient   Methods Explanation   Comprehension Verbalized understanding                    Plan - 07/24/2014 1135    Clinical Impression Statement Pt is a 74 yo male presenting to OP PT for evaluation prior to potential TAVR surgery. Pt's primary c/o is SOB which also appeared to be the primary limiting factor during the evaluation, particularly the 6 minute walk test. Pt demonstrated good strength and ROM. Pt scored WNL on all testing except 4-stage balance test on which his demonstrated an increased fall risk. Pt was educated on 2 simple exercises he can do safely at home.    PT Frequency One time visit   Consulted and Agree with Plan of Care Patient          G-Codes - 2014/07/24 1138    Functional Assessment Tool Used 6 minute walk 843' with 1 seated rest break   Functional Limitation Mobility: Walking and moving around   Mobility: Walking and Moving Around Current Status [phone]) At least 40 percent but less than 60 percent impaired, limited or restricted   Mobility: Walking and Moving Around Goal Status [phone]) At least 40 percent but less than 60 percent impaired, limited or restricted   Mobility: Walking and Moving Around Discharge Status [phone]) At least 40 percent but less than 60 percent impaired, limited or restricted       Problem List Patient Active Problem List   Diagnosis Date Noted  . Severe aortic stenosis 06/21/2014  . Shortness of breath   . Aortic stenosis, severe 05/10/2014  . COPD exacerbation 04/16/2014  . Multiple pulmonary nodules 03/02/2014  . Iliac artery stenosis, left 11/14/2013  . Pain of left thigh 11/14/2013  . Cough 03/27/2013  . Pleural plaque with presence of asbestos 03/27/2013  . PVD (peripheral vascular disease) 10/18/2012  . Carotid artery stenosis 04/22/2012  .  Postherpetic neuralgia 09/28/2011  . OTHER SPECIFIED DISEASE OF WHITE BLOOD CELLS 01/17/2010  . HEMOCCULT POSITIVE STOOL 01/17/2010  . Iron deficiency anemia 01/25/2009  . [facility] 01/25/2009  . ANGIODYSPLASIA-INTESTINE 01/25/2009  . PERSONAL HX COLONIC POLYPS 01/25/2009  . ANEMIA, SECONDARY TO BLOOD LOSS 12/03/2008  . GI BLEEDING 12/03/2008  . UNSPECIFIED ANEMIA 12/01/2008  . GERD 11/30/2008  . CAROTID BRUITS, BILATERAL 05/25/2008  . POLYP OF NASAL CAVITY 07/25/2007  . OTHER SPECIFIED DISORDERS OF ARTERIES&ARTERIOLES 06/24/2007  . NECK PAIN, LEFT 06/22/2007  . Benign neoplasm of colon 08/11/2006  . Hyperlipidemia 08/11/2006  . ERECTILE DYSFUNCTION 08/11/2006  . Essential hypertension 08/11/2006  . COPD GOLD III with min reversibilty  08/11/2006  . 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 BARRETT'S ESOPHAGUS, HX OF 08/11/2006  . Smoker 08/11/2006    Romualdo Bolk, PT 2014/07/24, 11:40 AM  [facility] [address] [place], [place], [address] Phone: [phone]   Fax:  [phone]

## 2014-07-09 ENCOUNTER — Ambulatory Visit (HOSPITAL_COMMUNITY)
Admission: RE | Admit: 2014-07-09 | Discharge: 2014-07-09 | Disposition: A | Discharge status: Home / Self Care | Payer: Medicare Other | Source: Ambulatory Visit | Attending: Thoracic Surgery (Cardiothoracic Vascular Surgery) | Admitting: Thoracic Surgery (Cardiothoracic Vascular Surgery)

## 2014-07-09 ENCOUNTER — Ambulatory Visit (HOSPITAL_COMMUNITY): Payer: Medicare Other

## 2014-07-09 DIAGNOSIS — K5730 Diverticulosis of large intestine without perforation or abscess without bleeding: Secondary | ICD-10-CM | POA: Diagnosis not present

## 2014-07-09 DIAGNOSIS — I350 Nonrheumatic aortic (valve) stenosis: Secondary | ICD-10-CM

## 2014-07-09 DIAGNOSIS — I2510 Atherosclerotic heart disease of native coronary artery without angina pectoris: Secondary | ICD-10-CM | POA: Diagnosis not present

## 2014-07-09 DIAGNOSIS — K8020 Calculus of gallbladder without cholecystitis without obstruction: Secondary | ICD-10-CM | POA: Diagnosis not present

## 2014-07-09 MED ORDER — METOPROLOL TARTRATE 1 MG/ML IV SOLN
INTRAVENOUS | Status: AC
  Filled 2014-07-09: qty 5

## 2014-07-09 MED ORDER — IOHEXOL 350 MG/ML SOLN
80.0000 mL | Freq: Once | INTRAVENOUS | Status: AC | PRN
  Administered 2014-07-09: 80 mL via INTRAVENOUS

## 2014-07-09 MED ORDER — IOHEXOL 350 MG/ML SOLN
80.0000 mL | Freq: Once | INTRAVENOUS | Status: AC | PRN
  Administered 2014-07-09: 70 mL via INTRAVENOUS

## 2014-07-09 MED ORDER — METOPROLOL TARTRATE 1 MG/ML IV SOLN
5.0000 mg | Freq: Once | INTRAVENOUS | Status: AC
  Administered 2014-07-09: 5 mg via INTRAVENOUS
  Filled 2014-07-09: qty 5

## 2014-07-23 ENCOUNTER — Telehealth: Payer: Self-pay | Admitting: *Deleted

## 2014-07-23 DIAGNOSIS — I2583 Coronary atherosclerosis due to lipid rich plaque: Principal | ICD-10-CM

## 2014-07-23 DIAGNOSIS — I2510 Atherosclerotic heart disease of native coronary artery without angina pectoris: Secondary | ICD-10-CM

## 2014-07-23 NOTE — Telephone Encounter (Signed)
Received message from Dr. Angelena Form that pt needs PCI. Dr. Angelena Form can do procedure on Aug 09, 2014.  I spoke with pt who would like to have procedure prior to Jul 31, 2014 due to his wife's upcoming surgery on Aug 02, 2014.  Will forward to Dr. Angelena Form for possible dates for procedure.

## 2014-07-24 NOTE — Telephone Encounter (Signed)
Thanks. [name], can we arrange when you get back? Thanks, chris

## 2014-07-24 NOTE — Telephone Encounter (Signed)
Pat/Triage (since pat is out until Thursday). He has a very complex procedure and I cannot arrange it this week due to cath lab scheduling. I am the only person in the cath lab Friday. I did not think he would want it on Monday of next week as he may not be able to assist his wife 3 days later. My other cath day next week is the 12th which is her surgery day. The 16th or 19th are the best dates unless he wants to do it next Monday on the 9th.   Gerald Stabs

## 2014-07-24 NOTE — Telephone Encounter (Signed)
Patient states he "definitely understands". He requests having it on May 19th. Patient informed that he would receive instructions in the mail and a phone call with further information after it is scheduled on Dr. Camillia Herter calendar. Routed to Enis Slipper, RN/Dr. Angelena Form.

## 2014-07-25 NOTE — Addendum Note (Signed)
Addended by: Lauree Chandler D on: 07/25/2014 11:10 AM   Modules accepted: Orders

## 2014-07-26 ENCOUNTER — Encounter: Payer: Self-pay | Admitting: *Deleted

## 2014-07-26 MED ORDER — CLOPIDOGREL BISULFATE 75 MG PO TABS: ORAL_TABLET | ORAL | Status: DC

## 2014-07-26 NOTE — Telephone Encounter (Signed)
Patient Demographics    Patient Name   Ronald Bell, Ronald Bell      Message  Received: [name], MD  [name], RN   [name], He was put on the cath schedule for 08/09/14. He will need pre-cath labs early that week. He will also need to be loaded with Plavix 600 mg this week and then started on Plavix 75 mg per day. Thanks, chris   Cath arranged for Aug 09, 2014 at 9:00.  I spoke with pt and verbally went over all instructions with him. Will mail copy of instructions to him.  He will come in on Aug 06, 2014 for lab work.  I explained to pt how to take Plavix. He will start this tomorrow.   Will send prescription to [facility] on [address]. Pt has additional questions about PCI and would like Dr. Angelena Form to call him to discuss

## 2014-07-27 NOTE — Telephone Encounter (Signed)
I spoke to the patient. cdm

## 2014-08-01 DIAGNOSIS — R42 Dizziness and giddiness: Secondary | ICD-10-CM | POA: Diagnosis not present

## 2014-08-06 ENCOUNTER — Other Ambulatory Visit (INDEPENDENT_AMBULATORY_CARE_PROVIDER_SITE_OTHER): Payer: Medicare Other | Admitting: *Deleted

## 2014-08-06 ENCOUNTER — Other Ambulatory Visit: Payer: Self-pay | Admitting: *Deleted

## 2014-08-06 DIAGNOSIS — I2510 Atherosclerotic heart disease of native coronary artery without angina pectoris: Secondary | ICD-10-CM | POA: Diagnosis not present

## 2014-08-06 DIAGNOSIS — D649 Anemia, unspecified: Secondary | ICD-10-CM

## 2014-08-06 DIAGNOSIS — I2583 Coronary atherosclerosis due to lipid rich plaque: Principal | ICD-10-CM

## 2014-08-06 LAB — CBC
Hemoglobin: 8.5 g/dL — ABNORMAL LOW (ref 13.0–17.0)
MCHC: 33.8 g/dL (ref 30.0–36.0)
MCV: 103.1 fl — ABNORMAL HIGH (ref 78.0–100.0)
Platelets: 325 10*3/uL (ref 150.0–400.0)
RBC: 2.44 Mil/uL — ABNORMAL LOW (ref 4.22–5.81)
RDW: 14.1 % (ref 11.5–15.5)
WBC: 8.8 10*3/uL (ref 4.0–10.5)

## 2014-08-06 LAB — BASIC METABOLIC PANEL
BUN: 24 mg/dL — AB (ref 6–23)
CHLORIDE: 102 meq/L (ref 96–112)
CO2: 27 mEq/L (ref 19–32)
CREATININE: 0.98 mg/dL (ref 0.40–1.50)
Calcium: 9.8 mg/dL (ref 8.4–10.5)
GFR: 79.04 mL/min (ref >60.00)
Glucose, Bld: 113 mg/dL — ABNORMAL HIGH (ref 70–99)
Potassium: 4.1 mEq/L (ref 3.5–5.1)
Sodium: 133 mEq/L — ABNORMAL LOW (ref 135–145)

## 2014-08-06 LAB — PROTIME-INR
INR: 1 ratio (ref 0.8–1.0)
Prothrombin Time: 11.3 s (ref 9.6–13.1)

## 2014-08-07 ENCOUNTER — Other Ambulatory Visit (INDEPENDENT_AMBULATORY_CARE_PROVIDER_SITE_OTHER): Payer: Medicare Other

## 2014-08-07 ENCOUNTER — Telehealth: Payer: Self-pay | Admitting: Family Medicine

## 2014-08-07 ENCOUNTER — Ambulatory Visit: Payer: Medicare Other | Admitting: Family Medicine

## 2014-08-07 ENCOUNTER — Telehealth: Payer: Self-pay | Admitting: Cardiovascular Disease

## 2014-08-07 ENCOUNTER — Encounter: Payer: Self-pay | Admitting: *Deleted

## 2014-08-07 DIAGNOSIS — D649 Anemia, unspecified: Secondary | ICD-10-CM | POA: Diagnosis not present

## 2014-08-07 LAB — CBC
MCHC: 33.1 g/dL (ref 30.0–36.0)
MCV: 104.3 fl — ABNORMAL HIGH (ref 78.0–100.0)
PLATELETS: 349 10*3/uL (ref 150.0–400.0)
RBC: 2.34 Mil/uL — ABNORMAL LOW (ref 4.22–5.81)
RDW: 14.6 % (ref 11.5–15.5)
WBC: 11.6 10*3/uL — ABNORMAL HIGH (ref 4.0–10.5)

## 2014-08-07 NOTE — Telephone Encounter (Signed)
Caller name: [place]   Call back number: [phone]    Reason for call:  Dr. Lauree Chandler [facility] office called and scheduled appt due to pt hemoglobin being 8.1. The patient could not come in today therefore scheduled an appointment for 08/09/2014.

## 2014-08-07 NOTE — Telephone Encounter (Signed)
To MD for review     KP 

## 2014-08-07 NOTE — Telephone Encounter (Signed)
New message      Talk to the nurse regarding his procedure this Thursday.

## 2014-08-07 NOTE — Telephone Encounter (Signed)
Pt called because he states had a bad heart burn last night. Pt said that he took Nexium and thumbs and did not help. Pt was made aware that he has on his medication list that he can take Protonix 40 mg in the AM and Pepcid 20 mg at bedtime, and for him not to take Nexium anymore. Pt verbalized understanding.

## 2014-08-08 ENCOUNTER — Ambulatory Visit: Payer: Medicare Other | Admitting: Cardiology

## 2014-08-09 ENCOUNTER — Encounter (HOSPITAL_COMMUNITY): Admission: RE | Payer: Self-pay | Source: Ambulatory Visit

## 2014-08-09 ENCOUNTER — Ambulatory Visit (INDEPENDENT_AMBULATORY_CARE_PROVIDER_SITE_OTHER): Payer: Medicare Other | Admitting: Family Medicine

## 2014-08-09 ENCOUNTER — Telehealth: Payer: Self-pay | Admitting: Cardiovascular Disease

## 2014-08-09 ENCOUNTER — Ambulatory Visit (HOSPITAL_COMMUNITY): Admission: RE | Admit: 2014-08-09 | Payer: Medicare Other | Source: Ambulatory Visit | Admitting: Cardiovascular Disease

## 2014-08-09 ENCOUNTER — Encounter: Payer: Self-pay | Admitting: Family Medicine

## 2014-08-09 VITALS — BP 122/50 | HR 74 | Temp 97.7°F | Wt 143.0 lb

## 2014-08-09 DIAGNOSIS — D649 Anemia, unspecified: Secondary | ICD-10-CM | POA: Diagnosis not present

## 2014-08-09 DIAGNOSIS — I2510 Atherosclerotic heart disease of native coronary artery without angina pectoris: Secondary | ICD-10-CM | POA: Diagnosis not present

## 2014-08-09 LAB — POCT HEMOGLOBIN: Hemoglobin: 8.5 g/dL — AB (ref 14.1–18.1)

## 2014-08-09 SURGERY — CORONARY ATHERECTOMY
Anesthesia: LOCAL

## 2014-08-09 NOTE — Telephone Encounter (Signed)
I would have him hold Plavix for now. Thanks, chris

## 2014-08-09 NOTE — Progress Notes (Signed)
Subjective:    Patient ID: Ronald Bell, male    DOB: 1940-10-18, 74 y.o.   MRN: [id number]  HPI  Patient here to f/u from cardiology with hgb 8.1.  Pt is tired but otherwise asymptomatic.  Past Medical History  Diagnosis Date  . Polyp of nasal cavity   . Rosacea   . Benign neoplasm of colon   . Tobacco abuse   . Psychosexual dysfunction with inhibited sexual excitement   . COPD   . Hypertension   . Hyperlipidemia   . GERD (gastroesophageal reflux disease)   . Anemia   . Arthritis   . GI bleed 2010    4 units PRBCs  . Adenomatous colon polyp   . Peripheral vascular disease   . Irregular heartbeat   . Shingles   . Carotid artery occlusion   . Angiodysplasia of intestine with hemorrhage   . Pleural plaque with presence of asbestos 03/27/2013    Followed in Pulmonary clinic/ [facility]/ [place] - F/u CT 09/08/2013 1. Stable extensive calcified pleural plaque formation consistent with asbestos exposure. 2. Multiple pulmonary nodules are unchanged from the CT of 6 months ago. Given risk factors for lung cancer, continued follow up is recommended with chest CT in 6 months> done 04/20/14 no change >repeat in 12 m in tickle file     . COPD GOLD III with min reversibilty  08/11/2006    Followed in Pulmonary clinic/ [facility]/ [place] - PFT's 04/28/2013  FEV1 0.88 (40%) with ratio 44 and 14% better p B2 dlco 45 corrects to 83 - Trial of breo 04/28/2013 > improved symptoms  06/09/2013  - spirometry 06/04/2014 FEV1  0.76 (29%) ratio 45      . GERD 11/30/2008    Qualifier: Diagnosis of  By: Marijean Niemann CMA, Danielle    . Iron deficiency anemia 01/25/2009    Qualifier: Diagnosis of  By: Henrene Pastor MD, Docia Chuck   . PVD (peripheral vascular disease) 10/18/2012  . Carotid artery stenosis 04/22/2012    Review of Systems  Constitutional: Negative for diaphoresis, appetite change, fatigue and unexpected weight change.  Eyes: Negative for pain, redness and visual disturbance.  Respiratory: Negative  for cough, chest tightness, shortness of breath and wheezing.   Cardiovascular: Negative for chest pain, palpitations and leg swelling.  Endocrine: Negative for cold intolerance, heat intolerance, polydipsia, polyphagia and polyuria.  Genitourinary: Negative for dysuria, frequency and difficulty urinating.  Neurological: Negative for dizziness, light-headedness, numbness and headaches.    Current Outpatient Prescriptions on File Prior to Visit  Medication Sig Dispense Refill  . amLODipine (NORVASC) 10 MG tablet TAKE 1 TABLET (10 MG TOTAL) BY MOUTH DAILY. 90 tablet 3  . Ascorbic Acid (VITAMIN C) 250 MG tablet Take 500 mg by mouth daily.     Marland Kitchen aspirin 81 MG EC tablet Take 81 mg by mouth daily.      Marland Kitchen atorvastatin (LIPITOR) 40 MG tablet Take 1 tablet (40 mg total) by mouth daily. 90 tablet 3  . BREO ELLIPTA 100-25 MCG/INH AEPB INHALE ONE PUFF INTO LUNGS ONCE DAILY IN THE MORNING 60 each 2  . docusate sodium (COLACE) 100 MG capsule Take 100 mg by mouth 2 (two) times daily as needed (constipation).     . famotidine (PEPCID) 20 MG tablet One at bedtime (Patient taking differently: Take 20 mg by mouth at bedtime. One at bedtime) 30 tablet 11  . fenofibrate 160 MG tablet TAKE ONE TABLET BY MOUTH ONCE DAILY 30 tablet 5  .  Ferrous Sulfate (IRON) 325 (65 FE) MG TABS Take 1 tablet by mouth 2 (two) times daily. 90 each 3  . Ipratropium-Albuterol (COMBIVENT RESPIMAT) 20-100 MCG/ACT AERS respimat Inhale 1 puff into the lungs 4 (four) times daily as needed for wheezing. 1 Inhaler 3  . Multiple Vitamins-Minerals (CENTRUM SILVER PO) Take 1 tablet by mouth daily.     . pantoprazole (PROTONIX) 40 MG tablet TAKE ONE TABLET BY MOUTH ONCE DAILY (Patient taking differently: Take 40 mg by mouth daily. TAKE ONE TABLET BY MOUTH ONCE DAILY) 90 tablet 3  . valsartan-hydrochlorothiazide (DIOVAN HCT) 160-12.5 MG per tablet Take 1 tablet by mouth daily. 90 tablet 3  . VOLTAREN 1 % GEL Apply 2 g topically 4 (four) times daily  as needed (for arthritis).      No current facility-administered medications on file prior to visit.       Objective:    Physical Exam  Constitutional: He is oriented to person, place, and time. Vital signs are normal. He appears well-developed and well-nourished. He is sleeping.  HENT:  Head: Normocephalic and atraumatic.  Mouth/Throat: Oropharynx is clear and moist.  Eyes: EOM are normal. Pupils are equal, round, and reactive to light.  Neck: Normal range of motion. Neck supple. No thyromegaly present.  Cardiovascular: Normal rate and regular rhythm.   No murmur heard. Pulmonary/Chest: Effort normal and breath sounds normal. No respiratory distress. He has no wheezes. He has no rales. He exhibits no tenderness.  Abdominal: Soft. There is no tenderness. There is no rebound and no guarding.  Musculoskeletal: He exhibits no edema or tenderness.  Neurological: He is alert and oriented to person, place, and time.  Skin: Skin is warm and dry.  Psychiatric: He has a normal mood and affect. His behavior is normal. Judgment and thought content normal.    BP 122/50 mmHg  Pulse 74  Temp(Src) 97.7 F (36.5 C) (Oral)  Wt 143 lb (64.864 kg)  SpO2 97% Wt Readings from Last 3 Encounters:  08/09/14 143 lb (64.864 kg)  07/02/14 142 lb (64.411 kg)  06/28/14 135 lb (61.236 kg)     Lab Results  Component Value Date   WBC 11.6* 08/07/2014   HGB 8.5* 08/09/2014   HCT 24.4 Repeated and verified X2.* 08/07/2014   PLT 349.0 08/07/2014   GLUCOSE 113* 08/06/2014   CHOL 154 05/10/2014   TRIG 125.0 05/10/2014   HDL 55.40 05/10/2014   LDLCALC 74 05/10/2014   ALT 21 06/01/2014   AST 26 06/01/2014   NA 133* 08/06/2014   K 4.1 08/06/2014   CL 102 08/06/2014   CREATININE 0.98 08/06/2014   BUN 24* 08/06/2014   CO2 27 08/06/2014   TSH 1.206 06/01/2014   PSA 1.47 05/10/2014   INR 1.0 08/06/2014   HGBA1C 5.3 06/01/2014   MICROALBUR 1.1 03/05/2014   hgb today---8.5    Assessment & Plan:    Problem List Items Addressed This Visit    None    Visit Diagnoses    Anemia, unspecified anemia type    -  Primary    Relevant Orders    POCT hemoglobin (Completed)    CBC with Differential/Platelet     hx AVM-- pt is asymptomatic-- con't iron supplement Pt will call cardiology to discuss plavix  I have discontinued Mr. Chancy multivitamin with minerals and guaiFENesin. I am also having him maintain his aspirin, Multiple Vitamins-Minerals (CENTRUM SILVER PO), vitamin C, Iron, amLODipine, atorvastatin, pantoprazole, valsartan-hydrochlorothiazide, Ipratropium-Albuterol, VOLTAREN, docusate sodium, famotidine, fenofibrate, and BREO  ELLIPTA.  No orders of the defined types were placed in this encounter.     [name], DO

## 2014-08-09 NOTE — Patient Instructions (Signed)
°

## 2014-08-09 NOTE — Assessment & Plan Note (Signed)
Hx AVM Pt stool always heme + hgb was 11 in April--- 8.5 today Will repeat on Monday Pt to see GI on Wednesday

## 2014-08-09 NOTE — Telephone Encounter (Signed)
Spoke with pt.  PCI was recently cancelled due to low H/H. Saw primary care today. Lab work ordered for Monday and pt scheduled to see GI on Tuesday. Pt reports history of blood in stool in past.  Pt reports stool is currently dark but it is always dark.  On iron. He is asking if he should continue Clopidogrel.  Will forward to Dr. Angelena Form for recommendations.

## 2014-08-09 NOTE — Telephone Encounter (Signed)
Spoke with pt and gave him instructions from Dr. McAlhany.   

## 2014-08-09 NOTE — Telephone Encounter (Signed)
Pt c/o medication issue:  1. Name of Medication: Clopidogrel  2. How are you currently taking this medication (dosage and times per day)? 75 mg 1x per day  3. Are you having a reaction (difficulty breathing--STAT)? Yes  4. What is your medication issue? Pt calling stating that his hemoglobin very low, please call back and advise.

## 2014-08-09 NOTE — Progress Notes (Signed)
Pre visit review using our clinic review tool, if applicable. No additional management support is needed unless otherwise documented below in the visit note. 

## 2014-08-13 ENCOUNTER — Other Ambulatory Visit (INDEPENDENT_AMBULATORY_CARE_PROVIDER_SITE_OTHER): Payer: Medicare Other

## 2014-08-13 DIAGNOSIS — D649 Anemia, unspecified: Secondary | ICD-10-CM

## 2014-08-13 LAB — CBC WITH DIFFERENTIAL/PLATELET
Basophils Absolute: 0.1 10*3/uL (ref 0.0–0.1)
Basophils Relative: 0.8 % (ref 0.0–3.0)
Eosinophils Absolute: 0.4 10*3/uL (ref 0.0–0.7)
Eosinophils Relative: 4.7 % (ref 0.0–5.0)
HCT: 28.8 % — ABNORMAL LOW (ref 39.0–52.0)
HEMOGLOBIN: 9.6 g/dL — AB (ref 13.0–17.0)
LYMPHS ABS: 1.3 10*3/uL (ref 0.7–4.0)
Lymphocytes Relative: 14.9 % (ref 12.0–46.0)
MCHC: 33.5 g/dL (ref 30.0–36.0)
MCV: 104.5 fl — AB (ref 78.0–100.0)
MONO ABS: 0.9 10*3/uL (ref 0.1–1.0)
Monocytes Relative: 10.7 % (ref 3.0–12.0)
Neutro Abs: 5.9 10*3/uL (ref 1.4–7.7)
Neutrophils Relative %: 68.9 % (ref 43.0–77.0)
Platelets: 444 10*3/uL — ABNORMAL HIGH (ref 150.0–400.0)
RBC: 2.75 Mil/uL — ABNORMAL LOW (ref 4.22–5.81)
RDW: 14.6 % (ref 11.5–15.5)
WBC: 8.5 10*3/uL (ref 4.0–10.5)

## 2014-08-15 ENCOUNTER — Other Ambulatory Visit (INDEPENDENT_AMBULATORY_CARE_PROVIDER_SITE_OTHER): Payer: Medicare Other

## 2014-08-15 ENCOUNTER — Encounter: Payer: Self-pay | Admitting: Physician Assistant

## 2014-08-15 ENCOUNTER — Ambulatory Visit (INDEPENDENT_AMBULATORY_CARE_PROVIDER_SITE_OTHER): Payer: Medicare Other | Admitting: Physician Assistant

## 2014-08-15 VITALS — BP 120/34 | HR 88 | Ht 63.0 in | Wt 147.2 lb

## 2014-08-15 DIAGNOSIS — D62 Acute posthemorrhagic anemia: Secondary | ICD-10-CM

## 2014-08-15 DIAGNOSIS — K921 Melena: Secondary | ICD-10-CM

## 2014-08-15 DIAGNOSIS — I2510 Atherosclerotic heart disease of native coronary artery without angina pectoris: Secondary | ICD-10-CM

## 2014-08-15 LAB — CBC WITH DIFFERENTIAL/PLATELET
BASOS PCT: 0.8 % (ref 0.0–3.0)
Basophils Absolute: 0.1 10*3/uL (ref 0.0–0.1)
Eosinophils Absolute: 0.5 10*3/uL (ref 0.0–0.7)
Eosinophils Relative: 4.7 % (ref 0.0–5.0)
HCT: 29.3 % — ABNORMAL LOW (ref 39.0–52.0)
HEMOGLOBIN: 9.8 g/dL — AB (ref 13.0–17.0)
Lymphocytes Relative: 12.7 % (ref 12.0–46.0)
Lymphs Abs: 1.2 10*3/uL (ref 0.7–4.0)
MCHC: 33.5 g/dL (ref 30.0–36.0)
MCV: 104.6 fl — ABNORMAL HIGH (ref 78.0–100.0)
Monocytes Absolute: 1.1 10*3/uL — ABNORMAL HIGH (ref 0.1–1.0)
Monocytes Relative: 11 % (ref 3.0–12.0)
Neutro Abs: 6.9 10*3/uL (ref 1.4–7.7)
Neutrophils Relative %: 70.8 % (ref 43.0–77.0)
Platelets: 389 10*3/uL (ref 150.0–400.0)
RBC: 2.8 Mil/uL — AB (ref 4.22–5.81)
RDW: 14.9 % (ref 11.5–15.5)
WBC: 9.8 10*3/uL (ref 4.0–10.5)

## 2014-08-15 NOTE — Progress Notes (Signed)
Patient ID: JAHVON GOSLINE, male   DOB: March 18, 1941, 74 y.o.   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 Colonoscopy in January 2013 showed 3 polyps including residual adenoma from higher piecemeal polypectomy. 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 Colonoscopy 10/23/2013 had 3 diminutive polyps in the cecum removed. Angiodysplastic lesions in the cecum were noted as well along with moderate diverticulosis in the sigmoid.  Limuel has a history of aortic stenosis severe COPD peripheral vascular disease, hyperlipidemia, hypertension, and anemia. He was recently seen by Dr. Angelena Form regarding his aortic valve disease. Surya had been followed by Dr. Gypsy Balsam for aortic valve stenosis. He had a arthroscopy of the left knee in December 15 and subsequently experienced dyspnea with exertion. Follow-up echo in January 2016 showed severe aortic stenosis. LV function was normal with LVEF 55%. He was hospitalized in late January 2016 with respiratory failure felt to be due to COPD exacerbation and was treated with dilators, and a biotics, and still rides. He was evaluated by Dr. Shyrl Numbers of pulmonary who felt that a component of Andry's respiratory issues were likely due to congestive heart failure. He was then seen in the CT surgery office by Dr. Roxy Manns and March 2016 for further evaluation of aortic stenosis. In April 2016 he underwent a right and left heart catheterization and was found to have heavily calcified coronary arteries with severe disease in the mid LAD, mid RCA and moderately severe disease in the ostium of the circumflex flex. Prior imaging showed the presence of severe calcification of the thoracic aorta. He has known PAD with occluded right iliac artery and moderate left iliac artery stenosis with prior stent placement he has been on daily iron  supplements. Patient states he was scheduled to be admitted on the 19th for stent placement but when he had his preadmission blood work was found to be anemic. He was evaluated by his primary care provider Dr. Dimas Millin and has been sent here for evaluation. He states his stools are normally dark from iron but for the past several weeks they were darker than normal and jet black. When he was found to have a low hemoglobin on May 17 he was advised to discontinue his Plavix. Repeat blood work shows his hemoglobin to be 9.6 but he continues to have jet black stools. He admits to dyspnea on exertion and feeling more tired and weak than normal. He has not had any bright red blood per rectum. He has been having some epigastric discomfort and heartburn.   Past Medical History  Diagnosis Date  . Polyp of nasal cavity   . Rosacea   . Benign neoplasm of colon   . Tobacco abuse   . Psychosexual dysfunction with inhibited sexual excitement   . COPD   . Hypertension   . Hyperlipidemia   . GERD (gastroesophageal reflux disease)   . Anemia   . Arthritis   . GI bleed 2010    4 units PRBCs  . Adenomatous colon polyp   . Peripheral vascular disease   . Irregular heartbeat   . Shingles   . Carotid artery occlusion   . Angiodysplasia of intestine with hemorrhage   . Pleural plaque with presence of asbestos 03/27/2013    Followed in Pulmonary clinic/ [facility]/ [facility] - F/u CT 09/08/2013 1. Stable extensive calcified pleural plaque formation consistent with asbestos exposure.  2. Multiple pulmonary nodules are unchanged from the CT of 6 months ago. Given risk factors for lung cancer, continued follow up is recommended with chest CT in 6 months> done 04/20/14 no change >repeat in 12 m in tickle file     . COPD GOLD III with min reversibilty  08/11/2006    Followed in Pulmonary clinic/ [facility]/ [place] - PFT's 04/28/2013  FEV1 0.88 (40%) with ratio 44 and 14% better p B2 dlco 45 corrects to 83 - Trial of  breo 04/28/2013 > improved symptoms  06/09/2013  - spirometry 06/04/2014 FEV1  0.76 (29%) ratio 45      . GERD 11/30/2008    Qualifier: Diagnosis of  By: Marijean Niemann CMA, Danielle    . Iron deficiency anemia 01/25/2009    Qualifier: Diagnosis of  By: Henrene Pastor MD, Docia Chuck   . PVD (peripheral vascular disease) 10/18/2012  . Carotid artery stenosis 04/22/2012    Past Surgical History  Procedure Laterality Date  . Iliac artery stent Left 2005    CIA  . Esophagogastroduodenoscopy  2012    normal  . Colonoscopy  July 2015    Dr. Henrene Pastor  . Tonsillectomy    . Cardiac catheterization  2001  . Knee arthroscopy with medial menisectomy Left 03/08/2014    Procedure: LEFT KNEE SCOPE WITH MEDIAL MENISECTOMY AND CHONDROPLASTY;  Surgeon: Ninetta Lights, MD;  Location: [place];  Service: Orthopedics;  Laterality: Left;  . Left and right heart catheterization with coronary angiogram N/A 06/28/2014    Procedure: LEFT AND RIGHT HEART CATHETERIZATION WITH CORONARY ANGIOGRAM;  Surgeon: Jerline Pain, MD;  Location: [facility] CATH LAB;  Service: Cardiovascular;  Laterality: N/A;   Family History  Problem Relation Age of Onset  . Lung disease Mother     pulm fibrosis  . Uterine cancer    . Colitis Father   . Colon cancer Neg Hx   . Heart disease Brother   . Hypertension Brother   . Hyperlipidemia Brother   . Heart disease Daughter     cad  . Hypertension Son    History  Substance Use Topics  . Smoking status: Former Smoker -- 0.75 packs/day for 55 years    Types: Cigarettes, E-cigarettes    Quit date: 04/17/2014  . Smokeless tobacco: Never Used     Comment: pt states he knows he needs to quit. Also uses e-cig.  Marland Kitchen Alcohol Use: 2.4 oz/week    4 Glasses of wine per week     Comment: occasional   Current Outpatient Prescriptions  Medication Sig Dispense Refill  . amLODipine (NORVASC) 10 MG tablet TAKE 1 TABLET (10 MG TOTAL) BY MOUTH DAILY. 90 tablet 3  . Ascorbic Acid (VITAMIN C) 250 MG tablet Take 500 mg by mouth daily.      Marland Kitchen aspirin 81 MG EC tablet Take 81 mg by mouth daily.      Marland Kitchen atorvastatin (LIPITOR) 40 MG tablet Take 1 tablet (40 mg total) by mouth daily. 90 tablet 3  . BREO ELLIPTA 100-25 MCG/INH AEPB INHALE ONE PUFF INTO LUNGS ONCE DAILY IN THE MORNING 60 each 2  . docusate sodium (COLACE) 100 MG capsule Take 100 mg by mouth 2 (two) times daily as needed (constipation).     . famotidine (PEPCID) 20 MG tablet One at bedtime (Patient taking differently: Take 20 mg by mouth at bedtime. One at bedtime) 30 tablet 11  . fenofibrate 160 MG tablet TAKE ONE TABLET BY MOUTH ONCE DAILY 30 tablet  5  . Ferrous Sulfate (IRON) 325 (65 FE) MG TABS Take 1 tablet by mouth 2 (two) times daily. 90 each 3  . Ipratropium-Albuterol (COMBIVENT RESPIMAT) 20-100 MCG/ACT AERS respimat Inhale 1 puff into the lungs 4 (four) times daily as needed for wheezing. 1 Inhaler 3  . Multiple Vitamins-Minerals (CENTRUM SILVER PO) Take 1 tablet by mouth daily.     . pantoprazole (PROTONIX) 40 MG tablet TAKE ONE TABLET BY MOUTH ONCE DAILY (Patient taking differently: Take 40 mg by mouth daily. TAKE ONE TABLET BY MOUTH ONCE DAILY) 90 tablet 3  . valsartan-hydrochlorothiazide (DIOVAN HCT) 160-12.5 MG per tablet Take 1 tablet by mouth daily. 90 tablet 3  . VOLTAREN 1 % GEL Apply 2 g topically 4 (four) times daily as needed (for arthritis).      No current facility-administered medications for this visit.   No Known Allergies    Review of Systems: Per history of present illness otherwise negative.  LAB RESULTS:  Recent Labs  08/13/14 0938 08/15/14 1602  WBC 8.5 9.8  HGB 9.6* 9.8*  HCT 28.8* 29.3*  PLT 444.0* 389.0   CBC on 08/07/2014 white blood count 11.6, hemoglobin 8.1, hematocrit 24.4, platelets 349,000. CBC on 08/06/2014 white blood count 8.8, hemoglobin 8.5, hematocrit 25.1, platelets 325,000, MCV 103.1. CBC 06/26/2014 white blood count 8, hemoglobin 11.3, hematocrit 33.7, platelets 286,000. MCV 102.2.    Physical  Exam: General: Pleasant, elderly, pale male in no acute distress Head: Normocephalic and atraumatic Eyes:  sclerae anicteric, conjunctiva pink  Ears: Normal auditory acuity Lungs: Clear throughout to auscultation Heart: Regular rate and rhythm 3/6 systolic murmur Abdomen: Soft, non distended, non-tender. No masses, no hepatomegaly. Normal bowel sounds Rectal: Black stool heme positive Musculoskeletal: Symmetrical with no gross deformities  Extremities: No edema  Neurological: Alert oriented x 4, grossly nonfocal Psychological:  Alert and cooperative. Normal mood and affect  Assessment and Recommendations: 74 year old male with a history of GI bleeding due to AVMs recently found to have a drop in hemoglobin. He has been symptomatic with his anemia and has heme positive stools today. He has a history of severe aortic stenosis and severe COPD. A stent has been reviewed with Dr.Perry and has been advised to be admitted to the hospital as a direct admit to the hospitalist service so that he can be prepped as an inpatient for a colonoscopy and EGD. Patient expresses multiple concerns as his wife is ill and has upcoming surgery. Patient has deferred admission today but will call us tomorrow as he says he may be able to go into the hospital tomorrow for a procedure on Friday or go into the hospital early next week to have his procedure done as an inpatient.      Arissa Fagin, Deloris Ping 08/15/2014,

## 2014-08-15 NOTE — Progress Notes (Signed)
Patient well known to me. Multiple comorbidities. Known cecal AVM's. Low grade but definite GI bleeding while on plavix (now off). Needs CAD intervention as documented, but put off due to bleeding. High risk for GI procedures. Recommend that the patient be hospitalized, transfused if clinically needed, and undergo colonoscopy with AVM ablation and EGD/enteroscopy for the same to hopefully reduce risks of bleeding/anemia after CAD intervention and subsequent resumption of antiplatelet therapy.Will forward to GI hospital team.

## 2014-08-15 NOTE — Patient Instructions (Signed)
Please call [phone] at 8 am and update Cecille Rubin on your condition. Your physician has requested that you go to the basement for the following lab work before leaving today:  CBC

## 2014-08-16 ENCOUNTER — Telehealth: Payer: Self-pay | Admitting: Physician Assistant

## 2014-08-16 ENCOUNTER — Encounter (HOSPITAL_COMMUNITY): Payer: Self-pay

## 2014-08-16 ENCOUNTER — Observation Stay (HOSPITAL_COMMUNITY)
Admission: AD | Admit: 2014-08-16 | Discharge: 2014-08-18 | Disposition: A | Discharge status: Home / Self Care | Payer: Medicare Other | Source: Ambulatory Visit | Attending: Internal Medicine | Admitting: Internal Medicine

## 2014-08-16 DIAGNOSIS — I350 Nonrheumatic aortic (valve) stenosis: Secondary | ICD-10-CM | POA: Diagnosis not present

## 2014-08-16 DIAGNOSIS — D124 Benign neoplasm of descending colon: Secondary | ICD-10-CM | POA: Diagnosis not present

## 2014-08-16 DIAGNOSIS — K31819 Angiodysplasia of stomach and duodenum without bleeding: Secondary | ICD-10-CM | POA: Insufficient documentation

## 2014-08-16 DIAGNOSIS — K644 Residual hemorrhoidal skin tags: Secondary | ICD-10-CM | POA: Insufficient documentation

## 2014-08-16 DIAGNOSIS — I2510 Atherosclerotic heart disease of native coronary artery without angina pectoris: Secondary | ICD-10-CM | POA: Insufficient documentation

## 2014-08-16 DIAGNOSIS — D127 Benign neoplasm of rectosigmoid junction: Secondary | ICD-10-CM | POA: Diagnosis not present

## 2014-08-16 DIAGNOSIS — K921 Melena: Secondary | ICD-10-CM | POA: Diagnosis not present

## 2014-08-16 DIAGNOSIS — I10 Essential (primary) hypertension: Secondary | ICD-10-CM | POA: Insufficient documentation

## 2014-08-16 DIAGNOSIS — Z9981 Dependence on supplemental oxygen: Secondary | ICD-10-CM | POA: Diagnosis not present

## 2014-08-16 DIAGNOSIS — Z01810 Encounter for preprocedural cardiovascular examination: Secondary | ICD-10-CM

## 2014-08-16 DIAGNOSIS — Z95820 Peripheral vascular angioplasty status with implants and grafts: Secondary | ICD-10-CM | POA: Insufficient documentation

## 2014-08-16 DIAGNOSIS — Z791 Long term (current) use of non-steroidal anti-inflammatories (NSAID): Secondary | ICD-10-CM | POA: Insufficient documentation

## 2014-08-16 DIAGNOSIS — E785 Hyperlipidemia, unspecified: Secondary | ICD-10-CM | POA: Insufficient documentation

## 2014-08-16 DIAGNOSIS — D62 Acute posthemorrhagic anemia: Secondary | ICD-10-CM | POA: Insufficient documentation

## 2014-08-16 DIAGNOSIS — Z7982 Long term (current) use of aspirin: Secondary | ICD-10-CM | POA: Insufficient documentation

## 2014-08-16 DIAGNOSIS — Z87891 Personal history of nicotine dependence: Secondary | ICD-10-CM | POA: Insufficient documentation

## 2014-08-16 DIAGNOSIS — K5520 Angiodysplasia of colon without hemorrhage: Secondary | ICD-10-CM | POA: Diagnosis not present

## 2014-08-16 DIAGNOSIS — I739 Peripheral vascular disease, unspecified: Secondary | ICD-10-CM | POA: Insufficient documentation

## 2014-08-16 DIAGNOSIS — K219 Gastro-esophageal reflux disease without esophagitis: Secondary | ICD-10-CM | POA: Diagnosis not present

## 2014-08-16 DIAGNOSIS — J449 Chronic obstructive pulmonary disease, unspecified: Secondary | ICD-10-CM | POA: Insufficient documentation

## 2014-08-16 DIAGNOSIS — K6381 Dieulafoy lesion of intestine: Secondary | ICD-10-CM | POA: Insufficient documentation

## 2014-08-16 LAB — COMPREHENSIVE METABOLIC PANEL
ALT: 29 U/L (ref 17–63)
ANION GAP: 9 (ref 5–15)
AST: 23 U/L (ref 15–41)
Albumin: 4.1 g/dL (ref 3.5–5.0)
Alkaline Phosphatase: 28 U/L — ABNORMAL LOW (ref 38–126)
BUN: 16 mg/dL (ref 6–20)
CHLORIDE: 104 mmol/L (ref 101–111)
CO2: 23 mmol/L (ref 22–32)
CREATININE: 0.99 mg/dL (ref 0.61–1.24)
Calcium: 9.6 mg/dL (ref 8.9–10.3)
GFR calc Af Amer: >60 mL/min (ref >60)
GLUCOSE: 110 mg/dL — AB (ref 65–99)
POTASSIUM: 4.4 mmol/L (ref 3.5–5.1)
Sodium: 136 mmol/L (ref 135–145)
Total Bilirubin: 0.3 mg/dL (ref 0.3–1.2)
Total Protein: 7 g/dL (ref 6.5–8.1)

## 2014-08-16 LAB — CBC
HEMATOCRIT: 28.9 % — AB (ref 39.0–52.0)
Hemoglobin: 9.4 g/dL — ABNORMAL LOW (ref 13.0–17.0)
MCH: 35.5 pg — AB (ref 26.0–34.0)
MCHC: 32.5 g/dL (ref 30.0–36.0)
MCV: 109.1 fL — ABNORMAL HIGH (ref 78.0–100.0)
PLATELETS: 336 10*3/uL (ref 150–400)
RBC: 2.65 MIL/uL — ABNORMAL LOW (ref 4.22–5.81)
RDW: 15.2 % (ref 11.5–15.5)
WBC: 8.4 10*3/uL (ref 4.0–10.5)

## 2014-08-16 LAB — PLATELET INHIBITION P2Y12: Platelet Function  P2Y12: 289 [PRU] (ref 194–418)

## 2014-08-16 LAB — PREPARE RBC (CROSSMATCH)

## 2014-08-16 LAB — URINALYSIS, ROUTINE W REFLEX MICROSCOPIC
Bilirubin Urine: NEGATIVE
GLUCOSE, UA: NEGATIVE mg/dL
HGB URINE DIPSTICK: NEGATIVE
Ketones, ur: NEGATIVE mg/dL
Leukocytes, UA: NEGATIVE
Nitrite: NEGATIVE
PH: 7 (ref 5.0–8.0)
PROTEIN: NEGATIVE mg/dL
Specific Gravity, Urine: 1.011 (ref 1.005–1.030)
Urobilinogen, UA: 0.2 mg/dL (ref 0.0–1.0)

## 2014-08-16 LAB — ABO/RH: ABO/RH(D): A POS

## 2014-08-16 LAB — PROTIME-INR
INR: 1.09 (ref 0.00–1.49)
PROTHROMBIN TIME: 14.3 s (ref 11.6–15.2)

## 2014-08-16 MED ORDER — METOCLOPRAMIDE HCL 5 MG/ML IJ SOLN
10.0000 mg | Freq: Once | INTRAMUSCULAR | Status: AC
  Administered 2014-08-16: 10 mg via INTRAVENOUS
  Filled 2014-08-16: qty 2

## 2014-08-16 MED ORDER — IPRATROPIUM-ALBUTEROL 0.5-2.5 (3) MG/3ML IN SOLN
3.0000 mL | Freq: Two times a day (BID) | RESPIRATORY_TRACT | Status: DC
  Administered 2014-08-17 – 2014-08-18 (×2): 3 mL via RESPIRATORY_TRACT
  Filled 2014-08-16 (×2): qty 3

## 2014-08-16 MED ORDER — METOPROLOL TARTRATE 12.5 MG HALF TABLET
12.5000 mg | ORAL_TABLET | Freq: Two times a day (BID) | ORAL | Status: DC
  Administered 2014-08-16 – 2014-08-18 (×5): 12.5 mg via ORAL
  Filled 2014-08-16 (×6): qty 1

## 2014-08-16 MED ORDER — AMLODIPINE BESYLATE 10 MG PO TABS: 10.0000 mg | ORAL_TABLET | Freq: Every day | ORAL | Status: DC

## 2014-08-16 MED ORDER — FENOFIBRATE 160 MG PO TABS
160.0000 mg | ORAL_TABLET | Freq: Every day | ORAL | Status: DC
  Administered 2014-08-17 – 2014-08-18 (×2): 160 mg via ORAL
  Filled 2014-08-16 (×2): qty 1

## 2014-08-16 MED ORDER — FAMOTIDINE 20 MG PO TABS
20.0000 mg | ORAL_TABLET | Freq: Every day | ORAL | Status: DC
  Administered 2014-08-16 – 2014-08-17 (×2): 20 mg via ORAL
  Filled 2014-08-16 (×4): qty 1

## 2014-08-16 MED ORDER — ATORVASTATIN CALCIUM 40 MG PO TABS
40.0000 mg | ORAL_TABLET | Freq: Every day | ORAL | Status: DC
  Administered 2014-08-17 – 2014-08-18 (×2): 40 mg via ORAL
  Filled 2014-08-16 (×2): qty 1

## 2014-08-16 MED ORDER — SODIUM CHLORIDE 0.9 % IV SOLN
Freq: Once | INTRAVENOUS | Status: AC
  Administered 2014-08-16: 11:00:00 via INTRAVENOUS

## 2014-08-16 MED ORDER — PEG 3350-KCL-NA BICARB-NACL 420 G PO SOLR
4000.0000 mL | Freq: Once | ORAL | Status: AC
  Administered 2014-08-16: 4000 mL via ORAL
  Filled 2014-08-16: qty 4000

## 2014-08-16 MED ORDER — PANTOPRAZOLE SODIUM 40 MG PO TBEC
40.0000 mg | DELAYED_RELEASE_TABLET | Freq: Every day | ORAL | Status: DC
  Administered 2014-08-16 – 2014-08-18 (×3): 40 mg via ORAL
  Filled 2014-08-16 (×3): qty 1

## 2014-08-16 MED ORDER — IPRATROPIUM-ALBUTEROL 0.5-2.5 (3) MG/3ML IN SOLN
3.0000 mL | Freq: Three times a day (TID) | RESPIRATORY_TRACT | Status: DC
  Administered 2014-08-16 (×2): 3 mL via RESPIRATORY_TRACT
  Filled 2014-08-16 (×2): qty 3

## 2014-08-16 NOTE — Telephone Encounter (Signed)
Pt  Aware of room #.

## 2014-08-16 NOTE — H&P (Addendum)
History and Physical  Ronald Bell:[id number] DOB: May 15, 1940 DOA: 08/16/2014  Referring physician: EDP PCP: [name], DO   Chief Complaint: melena, anemia  HPI: Ronald Bell is a 74 y.o. male   With significant past medical history including COPD stage III, no on home oxygen, PVD with totally occluded right iliac artery and stent to the left iliac artery, significant CAD and severe aortic stenosis, was under evaluation by cardiology for possible cardiac cath and possible TAVR, he was recently started on plavix, pre procedure lab showed he has drop of hgb from 11 in April to 8.5 in may, plavix was stopped, cardiac procedure which was scheduled on 5/19 was cancelled, patient reported melena, he was sent to GI for further evaluation on 5/25. Patient has known history of AVMs and colonic polyps, last colonoscopy was in 10/2013, It was determined that patient should be admitted to have inpatient EGD and colonoscopy due to significant cardiac and pulmonary history. He is sent for direct admition on 5/26 to hospitalist service.   in addition to above mentioned melena, Patient also reports progressive weakness, DOE , but denies chest pain, no wheezing, no dizziness, no presyncope, no n/v, no hematemesis, no ab pain, no lower extremity edema, no fever, no dysuria.  Review of Systems:  Detail per HPI, Review of systems are otherwise negative  Past Medical History  Diagnosis Date  . Polyp of nasal cavity   . Rosacea   . Benign neoplasm of colon   . Tobacco abuse   . Psychosexual dysfunction with inhibited sexual excitement   . COPD   . Hypertension   . Hyperlipidemia   . GERD (gastroesophageal reflux disease)   . Anemia   . Arthritis   . GI bleed 2010    4 units PRBCs  . Adenomatous colon polyp   . Peripheral vascular disease   . Irregular heartbeat   . Shingles   . Carotid artery occlusion   . Angiodysplasia of intestine with hemorrhage   . Pleural plaque with  presence of asbestos 03/27/2013    Followed in Pulmonary clinic/ [facility]/ [place] - F/u CT 09/08/2013 1. Stable extensive calcified pleural plaque formation consistent with asbestos exposure. 2. Multiple pulmonary nodules are unchanged from the CT of 6 months ago. Given risk factors for lung cancer, continued follow up is recommended with chest CT in 6 months> done 04/20/14 no change >repeat in 12 m in tickle file     . COPD GOLD III with min reversibilty  08/11/2006    Followed in Pulmonary clinic/ [facility]/ [facility] - PFT's 04/28/2013  FEV1 0.88 (40%) with ratio 44 and 14% better p B2 dlco 45 corrects to 83 - Trial of breo 04/28/2013 > improved symptoms  06/09/2013  - spirometry 06/04/2014 FEV1  0.76 (29%) ratio 45      . GERD 11/30/2008    Qualifier: Diagnosis of  By: Marijean Niemann CMA, Danielle    . Iron deficiency anemia 01/25/2009    Qualifier: Diagnosis of  By: Henrene Pastor MD, Docia Chuck   . PVD (peripheral vascular disease) 10/18/2012  . Carotid artery stenosis 04/22/2012   Past Surgical History  Procedure Laterality Date  . Iliac artery stent Left 2005    CIA  . Esophagogastroduodenoscopy  2012    normal  . Colonoscopy  July 2015    Dr. Henrene Pastor  . Tonsillectomy    . Cardiac catheterization  2001  . Knee arthroscopy with medial menisectomy Left 03/08/2014    Procedure: LEFT  KNEE SCOPE WITH MEDIAL MENISECTOMY AND CHONDROPLASTY;  Surgeon: Ninetta Lights, MD;  Location: [place];  Service: Orthopedics;  Laterality: Left;  . Left and right heart catheterization with coronary angiogram N/A 06/28/2014    Procedure: LEFT AND RIGHT HEART CATHETERIZATION WITH CORONARY ANGIOGRAM;  Surgeon: Jerline Pain, MD;  Location: [facility] CATH LAB;  Service: Cardiovascular;  Laterality: N/A;   Social History:  reports that he quit smoking about 3 months ago. His smoking use included Cigarettes and E-cigarettes. He has a 41.25 pack-year smoking history. He has never used smokeless tobacco. He reports that he drinks about 2.4 oz of  alcohol per week. He reports that he does not use illicit drugs. Patient lives at home with wife & is able to participate in activities of daily living independently, though reported progressive DOE , not able to walk long distance.  No Known Allergies  Family History  Problem Relation Age of Onset  . Lung disease Mother     pulm fibrosis  . Uterine cancer    . Colitis Father   . Colon cancer Neg Hx   . Heart disease Brother   . Hypertension Brother   . Hyperlipidemia Brother   . Heart disease Daughter     cad  . Hypertension Son       Prior to Admission medications   Medication Sig Start Date End Date Taking? Authorizing Provider  amLODipine (NORVASC) 10 MG tablet TAKE 1 TABLET (10 MG TOTAL) BY MOUTH DAILY. 08/21/13   Rosalita Chessman, DO  Ascorbic Acid (VITAMIN C) 250 MG tablet Take 500 mg by mouth daily.     Historical Provider, MD  aspirin 81 MG EC tablet Take 81 mg by mouth daily.      Historical Provider, MD  atorvastatin (LIPITOR) 40 MG tablet Take 1 tablet (40 mg total) by mouth daily. 08/21/13   Yvonne R Lowne, DO  BREO ELLIPTA 100-25 MCG/INH AEPB INHALE ONE PUFF INTO LUNGS ONCE DAILY IN THE MORNING 07/02/14   Tanda Rockers, MD  docusate sodium (COLACE) 100 MG capsule Take 100 mg by mouth 2 (two) times daily as needed (constipation).     Historical Provider, MD  famotidine (PEPCID) 20 MG tablet One at bedtime Patient taking differently: Take 20 mg by mouth at bedtime. One at bedtime 06/04/14   Tanda Rockers, MD  fenofibrate 160 MG tablet TAKE ONE TABLET BY MOUTH ONCE DAILY 06/21/14   Rosalita Chessman, DO  Ferrous Sulfate (IRON) 325 (65 FE) MG TABS Take 1 tablet by mouth 2 (two) times daily. 07/25/10   Rosalita Chessman, DO  Ipratropium-Albuterol (COMBIVENT RESPIMAT) 20-100 MCG/ACT AERS respimat Inhale 1 puff into the lungs 4 (four) times daily as needed for wheezing. 05/10/14   Rosalita Chessman, DO  Multiple Vitamins-Minerals (CENTRUM SILVER PO) Take 1 tablet by mouth daily.     Historical  Provider, MD  pantoprazole (PROTONIX) 40 MG tablet TAKE ONE TABLET BY MOUTH ONCE DAILY Patient taking differently: Take 40 mg by mouth daily. TAKE ONE TABLET BY MOUTH ONCE DAILY 08/21/13   Rosalita Chessman, DO  valsartan-hydrochlorothiazide (DIOVAN HCT) 160-12.5 MG per tablet Take 1 tablet by mouth daily. 08/21/13   Alferd Apa Lowne, DO  VOLTAREN 1 % GEL Apply 2 g topically 4 (four) times daily as needed (for arthritis).  05/12/14   Historical Provider, MD    Physical Exam: BP 117/49 mmHg  Pulse 98  Temp(Src) 98.9 F (37.2 C) (Oral)  Resp 20  Ht 5\' 3"  (1.6 m)  Wt 66.679 kg (147 lb)  BMI 26.05 kg/m2  General:  AAOx3 Eyes: PERRL ENT: unremarkable Neck: supple, no JVD Cardiovascular: RRR, +systolic murmur, weak left pedal pulse, minimal right pedal pulse but palpable. Respiratory: CTABL Abdomen: soft/ND/ND, positive bowel sounds Skin: no rash Musculoskeletal:  No edema Psychiatric: calm/cooperative Neurologic: no focal deficit          Labs on Admission:  Basic Metabolic Panel: No results for input(s): NA, K, CL, CO2, GLUCOSE, BUN, CREATININE, CALCIUM, MG, PHOS in the last 168 hours. Liver Function Tests: No results for input(s): AST, ALT, ALKPHOS, BILITOT, PROT, ALBUMIN in the last 168 hours. No results for input(s): LIPASE, AMYLASE in the last 168 hours. No results for input(s): AMMONIA in the last 168 hours. CBC:  Recent Labs Lab 08/09/14 1105 08/13/14 0938 08/15/14 1602  WBC  --  8.5 9.8  NEUTROABS  --  5.9 6.9  HGB 8.5* 9.6* 9.8*  HCT  --  28.8* 29.3*  MCV  --  104.5* 104.6*  PLT  --  444.0* 389.0   Cardiac Enzymes: No results for input(s): CKTOTAL, CKMB, CKMBINDEX, TROPONINI in the last 168 hours.  BNP (last 3 results)  Recent Labs  05/31/14 2351  BNP 75.7    ProBNP (last 3 results) No results for input(s): PROBNP in the last 8760 hours.  CBG: No results for input(s): GLUCAP in the last 168 hours.  Radiological Exams on Admission: No results  found.  EKG: not on file  Assessment/Plan Present on Admission:  **None**   Melena with acute symptomatic anemia: admit to med/tele, obtain admission baseline labs, will transfuse 1unit prbc, plavix held prior to admission. GI consulted for EGD/Colonoscopy in AM.  HTN: sbp on admission 117/49, will continue norvasc, hold diovan/hctz for now, likely will restart after procedure tomorrow.  HLD/CAD/aortic stenosis/COPD stable at baseline, continue home meds except asa.   Addendum: GI requested patient to be transferred to [facility] due to concerning of signigicant history of cardiac and pulmonary disease. GI will consult cardiology at [place] to assist care.patient report has stopped plavix about 5 days ago. Cardiology to determine the need of plavix response assay to assess platelet inhibition piror to endoscope.  DVT prophylaxis with SCD's.   Consultants:  GI  Code Status: full   Family Communication:  patient  Disposition Plan: admit to med tele  Time spent: >54mins  Ivory Bail MD, PhD [facility] Pager [phone] If 7PM-7AM, please contact night-coverage at [email or web address], password [facility]

## 2014-08-16 NOTE — Anesthesia Preprocedure Evaluation (Addendum)
Anesthesia Evaluation  Patient identified by MRN, date of birth, ID band Patient awake    Reviewed: Allergy & Precautions, NPO status , Patient's Chart, lab work & pertinent test results, reviewed documented beta blocker date and time   Airway Mallampati: II   Neck ROM: Full    Dental  (+) Edentulous Upper, Edentulous Lower   Pulmonary shortness of breath, COPD oxygen dependent, former smoker,  Severe COPD home Oxygen breath sounds clear to auscultation        Cardiovascular hypertension, Pt. on medications + CAD and + Peripheral Vascular Disease + Valvular Problems/Murmurs AS Rhythm:Regular  ECHO 03/2014 EF 60%, Severe AS area .8, gradient 29   Neuro/Psych    GI/Hepatic GERD-  Medicated,  Endo/Other    Renal/GU      Musculoskeletal   Abdominal (+)  Abdomen: soft.    Peds  Hematology 9/29   Anesthesia Other Findings   Reproductive/Obstetrics                            Anesthesia Physical Anesthesia Plan  ASA: IV  Anesthesia Plan: MAC   Post-op Pain Management:    Induction: Intravenous  Airway Management Planned: Nasal Cannula  Additional Equipment:   Intra-op Plan:   Post-operative Plan:   Informed Consent: I have reviewed the patients History and Physical, chart, labs and discussed the procedure including the risks, benefits and alternatives for the proposed anesthesia with the patient or authorized representative who has indicated his/her understanding and acceptance.     Plan Discussed with:   Anesthesia Plan Comments: (AS severe, Ketamine and propofol together, use NEO for HTN, will not tolerate decrease afterload, anesthesia light)        Anesthesia Quick Evaluation

## 2014-08-16 NOTE — Progress Notes (Signed)
    Progress Note   Subjective  No specific complaints. Slightly more SOB than usual today.   Objective   Vital signs in last 24 hours: Temp:  [98.9 F (37.2 C)] 98.9 F (37.2 C) (05/26 1020) Pulse Rate:  [88-98] 98 (05/26 1020) Resp:  [20] 20 (05/26 1020) BP: (117-120)/(34-49) 117/49 mmHg (05/26 1020) Weight:  [147 lb (66.679 kg)-147 lb 4 oz (66.792 kg)] 147 lb (66.679 kg) (05/26 1020) Last BM Date: 08/15/14 General:    white male in NAD Heart:  Regular rate and rhythm Lungs: Respirations even and unlabored, lungs CTA bilaterally Abdomen:  Soft, nontender and nondistended. Normal bowel sounds. Extremities:  Without edema. Neurologic:  Alert and oriented,  grossly normal neurologically. Psych:  Cooperative. Normal mood and affect. Total I/O In: -  Out: 350 [Urine:350]  Lab Results:  Recent Labs  08/15/14 1602 08/16/14 1147  WBC 9.8 8.4  HGB 9.8* 9.4*  HCT 29.3* 28.9*  PLT 389.0 336  PT/INR  Recent Labs  08/16/14 1147  LABPROT 14.3  INR 1.09     Assessment / Plan:   64. 75 year old male with multiple medical problems not limited to severe COPD, severe CAD, and severe aortic valve stenosis.  He was scheduled for cardiac cath with PCI but procedure cancelled when pre-op labs revealed drop in hgb    2. Normocytic anemia / heme positive stools. Hgb was 11.3 in early April, down to 8.1 on 08/07/14 after starting Plavix.  It sounds like he was given a loading dose of plavix. Patient has a history of cecal AVMs which could have bled on Plavix. Hgb has spontaneously increased to mid 9 range over last few days. Of note patient has been off Plavix for at least 5 days now.  Patient needs EGD and colonoscopy with MAC. No anesthesia available at [facility] but we have a spot held for him at [facility]. Given high risk of procedure I consulted Cardiology for cardiac clearance (spoke with Hinton Dyer, P.A from [facility]). Keep on clears for time being.     Tye Savoy  08/16/2014,  12:24 PM

## 2014-08-16 NOTE — Consult Note (Signed)
CARDIOLOGY CONSULT NOTE       Patient ID: Ronald Bell MRN: [id number] DOB/AGE: 25-Apr-1940 74 y.o.  Admit date: 08/16/2014 Referring Physician:  Erlinda Hong Primary Physician: [name], DO Primary Cardiologist:  Marlou Porch Reason for Consultation:  Pre op  Active Problems:   Melena   HPI:  Complicated 74 y.o. directly admitted for GI procedures.  Hb drop from 11 to 9.5 with melena when placed on DAT for PCI.  Patient has severe calcified 3VD.  Cath done 06/28/14 by Dr Marlou Porch.  Also has severe AS with mean gradient of 34/60 mean/pk.  Normal EF 55-60%.  Gold 3 COPD heavy smoker until 3 months ago.  No active angina but more dyspnic and weak with hemoglobin drop.  History of AVM's and polyps on endo August 2015.  Anti platelet drugs stopped for GI evaluation under MAC in am  Discussed with Dr Angelena Form who has evaluated him for possible TAVR after revascularization.  Cannot have PCI/rotoblator until tolerates DAT so need to proceed with endo.  Patient understands risks and co morbidities.    ROS All other systems reviewed and negative except as noted above  Past Medical History  Diagnosis Date  . Polyp of nasal cavity   . Rosacea   . Benign neoplasm of colon   . Tobacco abuse   . Psychosexual dysfunction with inhibited sexual excitement   . COPD   . Hypertension   . Hyperlipidemia   . GERD (gastroesophageal reflux disease)   . Anemia   . Arthritis   . GI bleed 2010    4 units PRBCs  . Adenomatous colon polyp   . Peripheral vascular disease   . Irregular heartbeat   . Shingles   . Carotid artery occlusion   . Angiodysplasia of intestine with hemorrhage   . Pleural plaque with presence of asbestos 03/27/2013    Followed in Pulmonary clinic/ [facility]/ [place] - F/u CT 09/08/2013 1. Stable extensive calcified pleural plaque formation consistent with asbestos exposure. 2. Multiple pulmonary nodules are unchanged from the CT of 6 months ago. Given risk factors for lung cancer,  continued follow up is recommended with chest CT in 6 months> done 04/20/14 no change >repeat in 12 m in tickle file     . COPD GOLD III with min reversibilty  08/11/2006    Followed in Pulmonary clinic/ [facility]/ [place] - PFT's 04/28/2013  FEV1 0.88 (40%) with ratio 44 and 14% better p B2 dlco 45 corrects to 83 - Trial of breo 04/28/2013 > improved symptoms  06/09/2013  - spirometry 06/04/2014 FEV1  0.76 (29%) ratio 45      . GERD 11/30/2008    Qualifier: Diagnosis of  By: Marijean Niemann CMA, Danielle    . Iron deficiency anemia 01/25/2009    Qualifier: Diagnosis of  By: Henrene Pastor MD, Docia Chuck   . PVD (peripheral vascular disease) 10/18/2012  . Carotid artery stenosis 04/22/2012    Family History  Problem Relation Age of Onset  . Lung disease Mother     pulm fibrosis  . Uterine cancer    . Colitis Father   . Colon cancer Neg Hx   . Heart disease Brother   . Hypertension Brother   . Hyperlipidemia Brother   . Heart disease Daughter     cad  . Hypertension Son     History   Social History  . Marital Status: Married    Spouse Name: N/A  . Number of Children: 4  . Years of  Education: N/A   Occupational History  . Charity fundraiser    Social History Main Topics  . Smoking status: Former Smoker -- 0.75 packs/day for 55 years    Types: Cigarettes, E-cigarettes    Quit date: 04/17/2014  . Smokeless tobacco: Never Used     Comment: pt states he knows he needs to quit. Also uses e-cig.  Marland Kitchen Alcohol Use: 2.4 oz/week    4 Glasses of wine per week     Comment: occasional  . Drug Use: No  . Sexual Activity:    Partners: Female   Other Topics Concern  . Not on file   Social History Narrative    Past Surgical History  Procedure Laterality Date  . Iliac artery stent Left 2005    CIA  . Esophagogastroduodenoscopy  2012    normal  . Colonoscopy  July 2015    Dr. Henrene Pastor  . Tonsillectomy    . Cardiac catheterization  2001  . Knee arthroscopy with medial menisectomy Left 03/08/2014     Procedure: LEFT KNEE SCOPE WITH MEDIAL MENISECTOMY AND CHONDROPLASTY;  Surgeon: Ninetta Lights, MD;  Location: [place];  Service: Orthopedics;  Laterality: Left;  . Left and right heart catheterization with coronary angiogram N/A 06/28/2014    Procedure: LEFT AND RIGHT HEART CATHETERIZATION WITH CORONARY ANGIOGRAM;  Surgeon: Jerline Pain, MD;  Location: [facility] CATH LAB;  Service: Cardiovascular;  Laterality: N/A;     . [START ON 08/17/2014] amLODipine  10 mg Oral Daily  . [START ON 08/17/2014] atorvastatin  40 mg Oral Daily  . famotidine  20 mg Oral QHS  . [START ON 08/17/2014] fenofibrate  160 mg Oral Daily  . ipratropium-albuterol  3 mL Nebulization 3 times per day  . pantoprazole  40 mg Oral Daily      Physical Exam: Blood pressure 101/37, pulse 83, temperature 97.4 F (36.3 C), temperature source Axillary, resp. rate 18, height 5\' 3"  (1.6 m), weight 66.679 kg (147 lb), SpO2 97 %.   Affect appropriate Pale elderly male  HEENT: normal Neck supple with no adenopathy JVP normal bilateral bruits bruits no thyromegaly Lungs clear with no wheezing and good diaphragmatic motion Heart:  S1/S2 AS murmur, no rub, gallop or click PMI normal Abdomen: benighn, BS positve, no tenderness, no AAA no bruit.  No HSM or HJR Distal pulses markedly reduced on right  No edema Neuro non-focal Skin warm and dry No muscular weakness   Labs:   Lab Results  Component Value Date   WBC 8.4 08/16/2014   HGB 9.4* 08/16/2014   HCT 28.9* 08/16/2014   MCV 109.1* 08/16/2014   PLT 336 08/16/2014    Recent Labs Lab 08/16/14 1147  NA 136  K 4.4  CL 104  CO2 23  BUN 16  CREATININE 0.99  CALCIUM 9.6  PROT 7.0  BILITOT 0.3  ALKPHOS 28*  ALT 29  AST 23  GLUCOSE 110*   Lab Results  Component Value Date   CKTOTAL 74 12/04/2008   CKMB 2.2 12/04/2008   TROPONINI 0.06* 06/01/2014    Lab Results  Component Value Date   CHOL 154 05/10/2014   CHOL 157 03/05/2014   CHOL 145 08/21/2013   Lab Results    Component Value Date   HDL 55.40 05/10/2014   HDL 44.80 03/05/2014   HDL 48.10 08/21/2013   Lab Results  Component Value Date   LDLCALC 74 05/10/2014   LDLCALC 76 03/05/2014   LDLCALC 76 08/21/2013   Lab  Results  Component Value Date   TRIG 125.0 05/10/2014   TRIG 179.0* 03/05/2014   TRIG 107.0 08/21/2013   Lab Results  Component Value Date   CHOLHDL 3 05/10/2014   CHOLHDL 4 03/05/2014   CHOLHDL 3 08/21/2013   No results found for: LDLDIRECT    Radiology: No results found.  EKG:  05/2014  ST rate 103 inferior T wave changes    ASSESSMENT AND PLAN:  Pre-op:  Proceed with colonoscopy and EGD with MAC in am.   GI:  Anti platelets stopped Check P2Y consider tranfusion to Hb 10 with lasix CAD:  Start low dose beta blocker no active wheezing stop amlodipine.  Resume DAT post procedure if possible AS:  TAVR evaluation ongoing but needs to be revascualized first.  PVD precludes transfemoral approach and calcified root would make trans apical approach likely CT done by me 4/18 suitable for 26 mm Sapien 3 valve Chol:  On statin  Pulm:  comensated nebulizers/inhalers per primary service oxygen for procedure CXR 3/10 NAD COPD and fibrosis  Signed: Jenkins Rouge 08/16/2014, 1:47 PM

## 2014-08-17 ENCOUNTER — Observation Stay (HOSPITAL_COMMUNITY): Payer: Medicare Other | Admitting: Anesthesiology

## 2014-08-17 ENCOUNTER — Encounter (HOSPITAL_COMMUNITY): Payer: Self-pay | Admitting: *Deleted

## 2014-08-17 ENCOUNTER — Encounter (HOSPITAL_COMMUNITY)
Admission: AD | Disposition: A | Discharge status: Home / Self Care | Payer: Self-pay | Source: Ambulatory Visit | Attending: Internal Medicine

## 2014-08-17 DIAGNOSIS — D62 Acute posthemorrhagic anemia: Secondary | ICD-10-CM | POA: Diagnosis not present

## 2014-08-17 DIAGNOSIS — K6381 Dieulafoy lesion of intestine: Secondary | ICD-10-CM | POA: Insufficient documentation

## 2014-08-17 DIAGNOSIS — D649 Anemia, unspecified: Secondary | ICD-10-CM | POA: Diagnosis not present

## 2014-08-17 DIAGNOSIS — I2510 Atherosclerotic heart disease of native coronary artery without angina pectoris: Secondary | ICD-10-CM | POA: Diagnosis not present

## 2014-08-17 DIAGNOSIS — K31819 Angiodysplasia of stomach and duodenum without bleeding: Secondary | ICD-10-CM | POA: Insufficient documentation

## 2014-08-17 DIAGNOSIS — Z01810 Encounter for preprocedural cardiovascular examination: Secondary | ICD-10-CM

## 2014-08-17 DIAGNOSIS — K921 Melena: Secondary | ICD-10-CM | POA: Diagnosis not present

## 2014-08-17 DIAGNOSIS — K5520 Angiodysplasia of colon without hemorrhage: Secondary | ICD-10-CM | POA: Insufficient documentation

## 2014-08-17 DIAGNOSIS — I350 Nonrheumatic aortic (valve) stenosis: Secondary | ICD-10-CM | POA: Diagnosis not present

## 2014-08-17 DIAGNOSIS — J449 Chronic obstructive pulmonary disease, unspecified: Secondary | ICD-10-CM | POA: Diagnosis not present

## 2014-08-17 DIAGNOSIS — K922 Gastrointestinal hemorrhage, unspecified: Secondary | ICD-10-CM | POA: Diagnosis not present

## 2014-08-17 DIAGNOSIS — I739 Peripheral vascular disease, unspecified: Secondary | ICD-10-CM | POA: Diagnosis not present

## 2014-08-17 HISTORY — PX: COLONOSCOPY: SHX5424

## 2014-08-17 HISTORY — PX: ESOPHAGOGASTRODUODENOSCOPY: SHX5428

## 2014-08-17 LAB — TYPE AND SCREEN
ABO/RH(D): A POS
Antibody Screen: NEGATIVE
Unit division: 0

## 2014-08-17 SURGERY — EGD (ESOPHAGOGASTRODUODENOSCOPY)
Anesthesia: Monitor Anesthesia Care

## 2014-08-17 MED ORDER — GLUCAGON HCL RDNA (DIAGNOSTIC) 1 MG IJ SOLR
INTRAMUSCULAR | Status: DC | PRN
  Administered 2014-08-17: 0.25 mg via INTRAVENOUS

## 2014-08-17 MED ORDER — GLUCAGON HCL RDNA (DIAGNOSTIC) 1 MG IJ SOLR
INTRAMUSCULAR | Status: AC
  Filled 2014-08-17: qty 1

## 2014-08-17 MED ORDER — EPINEPHRINE HCL 1 MG/ML IJ SOLN
INTRAMUSCULAR | Status: DC | PRN
  Administered 2014-08-17 (×2): 1.5 mg

## 2014-08-17 MED ORDER — BUTAMBEN-TETRACAINE-BENZOCAINE 2-2-14 % EX AERO
INHALATION_SPRAY | CUTANEOUS | Status: DC | PRN
  Administered 2014-08-17: 2 via TOPICAL

## 2014-08-17 MED ORDER — PROPOFOL 10 MG/ML IV BOLUS
INTRAVENOUS | Status: DC | PRN
  Administered 2014-08-17 (×12): 20 mg via INTRAVENOUS

## 2014-08-17 MED ORDER — LACTATED RINGERS IV SOLN
INTRAVENOUS | Status: DC | PRN
  Administered 2014-08-17: 10:00:00 via INTRAVENOUS

## 2014-08-17 MED ORDER — ONDANSETRON HCL 4 MG/2ML IJ SOLN
INTRAMUSCULAR | Status: DC | PRN
  Administered 2014-08-17: 4 mg via INTRAVENOUS

## 2014-08-17 MED ORDER — LACTATED RINGERS IV SOLN
INTRAVENOUS | Status: DC
  Administered 2014-08-17: 09:00:00 via INTRAVENOUS

## 2014-08-17 MED ORDER — DEXMEDETOMIDINE HCL IN NACL 400 MCG/100ML IV SOLN
INTRAVENOUS | Status: DC | PRN
Start: 2014-08-17 — End: 2014-08-17
  Administered 2014-08-17: .4 ug/kg/h via INTRAVENOUS
  Administered 2014-08-17: .5 ug/kg/h via INTRAVENOUS

## 2014-08-17 MED ORDER — PHENYLEPHRINE HCL 10 MG/ML IJ SOLN
INTRAMUSCULAR | Status: DC | PRN
  Administered 2014-08-17 (×4): 80 ug via INTRAVENOUS
  Administered 2014-08-17: 40 ug via INTRAVENOUS
  Administered 2014-08-17: 80 ug via INTRAVENOUS

## 2014-08-17 MED ORDER — MIDAZOLAM HCL 5 MG/5ML IJ SOLN
INTRAMUSCULAR | Status: DC | PRN
  Administered 2014-08-17: 1 mg via INTRAVENOUS

## 2014-08-17 MED ORDER — FENTANYL CITRATE (PF) 100 MCG/2ML IJ SOLN
INTRAMUSCULAR | Status: DC | PRN
  Administered 2014-08-17 (×2): 50 ug via INTRAVENOUS
  Administered 2014-08-17: 25 ug via INTRAVENOUS

## 2014-08-17 MED ORDER — SODIUM CHLORIDE 0.9 % IV SOLN
INTRAVENOUS | Status: DC
  Administered 2014-08-17: 08:00:00 via INTRAVENOUS

## 2014-08-17 MED ORDER — LIDOCAINE HCL (CARDIAC) 20 MG/ML IV SOLN
INTRAVENOUS | Status: DC | PRN
  Administered 2014-08-17: 50 mg via INTRAVENOUS

## 2014-08-17 NOTE — Anesthesia Postprocedure Evaluation (Signed)
  Anesthesia Post-op Note  Patient: Ronald Bell  Procedure(s) Performed: Procedure(s): ESOPHAGOGASTRODUODENOSCOPY (EGD) (N/A) COLONOSCOPY (N/A)  Patient Location: PACU  Anesthesia Type:MAC  Level of Consciousness: awake  Airway and Oxygen Therapy: Patient Spontanous Breathing  Post-op Pain: mild  Post-op Assessment: Post-op Vital signs reviewed and Patient's Cardiovascular Status Stable  Post-op Vital Signs: Reviewed and stable  Last Vitals:  Filed Vitals:   08/17/14 1155  BP:   Pulse: 72  Temp:   Resp: 15    Complications: No apparent anesthesia complications

## 2014-08-17 NOTE — Transfer of Care (Signed)
Immediate Anesthesia Transfer of Care Note  Patient: Ronald Bell  Procedure(s) Performed: Procedure(s): ESOPHAGOGASTRODUODENOSCOPY (EGD) (N/A) COLONOSCOPY (N/A)  Patient Location: PACU  Anesthesia Type:MAC  Level of Consciousness: awake, alert , oriented and patient cooperative  Airway & Oxygen Therapy: Patient Spontanous Breathing and Patient connected to nasal cannula oxygen  Post-op Assessment: Report given to RN, Post -op Vital signs reviewed and stable and Patient moving all extremities  Post vital signs: Reviewed and stable  Last Vitals:  Filed Vitals:   08/17/14 1129  BP:   Pulse: 73  Temp:   Resp: 10    Complications: No apparent anesthesia complications

## 2014-08-17 NOTE — Progress Notes (Signed)
    Subjective:  Denies CP or dyspnea   Objective:  Filed Vitals:   08/16/14 1749 08/16/14 1942 08/16/14 2032 08/17/14 0336  BP: 130/41  145/99 123/46  Pulse: 83  84 70  Temp:   98.1 F (36.7 C) 97.6 F (36.4 C)  TempSrc:   Oral Oral  Resp:   18 18  Height:   5\' 3"  (1.6 m)   Weight:   143 lb 15.4 oz (65.3 kg)   SpO2:  94% 95% 97%    Intake/Output from previous day:  Intake/Output Summary (Last 24 hours) at 08/17/14 0842 Last data filed at 08/16/14 2300  Gross per 24 hour  Intake   4794 ml  Output    655 ml  Net   4139 ml    Physical Exam: Physical exam: Well-developed well-nourished in no acute distress.  Skin is warm and dry.  HEENT is normal.  Neck is supple. Chest with diminished BS throughout Cardiovascular exam is regular rate and rhythm. 3/6 systolic murmur Abdominal exam nontender or distended. No masses palpated. Extremities show no edema. neuro grossly intact    Lab Results: Basic Metabolic Panel:  Recent Labs  08/16/14 1147  NA 136  K 4.4  CL 104  CO2 23  GLUCOSE 110*  BUN 16  CREATININE 0.99  CALCIUM 9.6   CBC:  Recent Labs  08/15/14 1602 08/16/14 1147  WBC 9.8 8.4  NEUTROABS 6.9  --   HGB 9.8* 9.4*  HCT 29.3* 28.9*  MCV 104.6* 109.1*  PLT 389.0 336     Assessment/Plan:  1 GI blood loss-patient for EGD and colonoscopy today. High risk for any procedure. 2 coronary artery disease-documented on recent catheterization. Plan was for PCI but patient developed melena on dual antiplatelet therapy. Plan to proceed with GI evaluation. Once bleeding site identified and corrected will resume antiplatelet therapy and if hemoglobin remains stable can then schedule PCI. Continue statin and metoprolol. 3 severe aortic stenosis-patient has been evaluated for TAVR with plans to possibly proceed following PCI. 4 COPD 5 hyperlipidemia-continue statin.   Kirk Ruths 08/17/2014, 8:42 AM

## 2014-08-17 NOTE — Op Note (Signed)
[facility] [place], [id number]   ENDOSCOPY PROCEDURE REPORT  PATIENT: Ronald Bell, Ronald Bell  MR#: [id number] BIRTHDATE: 05-03-1940 , 8  yrs. old GENDER: male ENDOSCOPIST: Jerene Bears, MD REFERRED BY:  Verl Bangs, M.D.  Eustace Quail, M.D. PROCEDURE DATE:  08/17/2014 PROCEDURE:  EGD, diagnostic, EGD w/ ablation , and EGD w/ directed submucosal injection(s), any substance ASA CLASS:     Class III INDICATIONS:  melena, history of angiodysplastic lesions of the stomach, small bowel and colon, recent acute on chronic anemia, heme positive stool. MEDICATIONS: Per Anesthesia and Monitored anesthesia care TOPICAL ANESTHETIC: none  DESCRIPTION OF PROCEDURE: After the risks benefits and alternatives of the procedure were thoroughly explained, informed consent was obtained.  The Ultraslim Pentax colonoscope was introduced through the mouth and advanced to the second portion of the duodenum , Without limitations.  The instrument was slowly withdrawn as the mucosa was fully examined.   ESOPHAGUS: The mucosa of the esophagus appeared normal.   Z line is regular  STOMACH: 3 angiodysplastic lesion were found, 1 in the gastric cardia which was ablated with APC.  2 lesions in the gastric body which were first injected with epinephrine around the lesion and then successfully ablated with APC. The gastric mucosa was otherwise unremarkable.  DUODENUM: 4 angiodysplastic lesions which were not actively bleeding were found in the second, third and fourth portion of the duodenum. These were successfully ablated with APC. In the second or third portion of the duodenum there was a 8-10 mm submucosal lesion with normal overlying mucosa, query submucosal lipoma or small GIST. This lesion is very unlikely to be or ever become clinically significant  JEJUNUM: 6 angiodysplastic lesions with no active bleeding were found in the proximal jejunum.   These lesions were successfully ablated with APC.  Retroflexed views revealed no abnormalities.     The scope was then withdrawn from the patient and the procedure completed.  COMPLICATIONS: There were no immediate complications.  ENDOSCOPIC IMPRESSION: 1.   The mucosa of the esophagus appeared normal 2.   3 gastric angiodysplastic lesions, 2 Injected with epinephrine before ablation; all 3 lesions successfully ablated with APC 3.   4 angiodysplastic lesions found in the duodenum; successfully ablated with APC 4.   6 angiodysplastic lesions found in the proximal jejunum; successfully ablated with APC  RECOMMENDATIONS: 1.  Proceed with colonoscopy 2.  No NSAIDs 3.  Once daily PPI 4.  Would avoid antiplatelets or anticoagulants for at least 1 week to allow healing of the lesions treated today 5.  Monitor blood counts to ensure improvement, replace iron as needed  eSigned:  Jerene Bears, MD 08/17/2014 11:49 AM    CC: the patient  PATIENT NAME:  Zayaan, Kozak MR#: [id number]

## 2014-08-17 NOTE — Progress Notes (Signed)
[facility] PROGRESS NOTE Interim History: 74 y.o. male With significant past medical history including COPD stage III, no on home oxygen, PVD with totally occluded right iliac artery and stent to the left iliac artery, significant CAD and severe aortic stenosis, was under evaluation by cardiology for possible cardiac cath and possible TAVR, he was recently started on plavix, pre procedure lab showed he has drop of hgb from 11 in April to 8.5 in may, plavix was stopped, cardiac procedure which was scheduled on 5/19 was cancelled, patient reported melena, he was sent to GI, GI was consulted EGD and colonoscopy performed on 08/17/2014 with results as below.   Assessment/Plan:   Melena/acute blood loss anemia: - Likely due to angiodysplasia. - Admitted to telemetry there was a drop in hemoglobin from 11->8.5 he status post 1 unit of packed red blood cells Plavix was held. - EGD and colonoscopy performed on 08/17/2014. Showed multiple angiodysplastic lesions. - No further signs of bleeding were discussed with oncology.  Preop cardiovascular exam - Cardiology was consulted they deemed high risk for any procedure. - P2 Y 2189, once bleeding site corrected To antiplatelet therapy. Into schedule PCI, continue statins and metoprolol.  Severe  Aortic stenosis - We'll need CABG further recommendations per cardiology.   Code Status: full Family Communication: wife  Disposition Plan: home in am   Consultants:  GI  cardiology  Procedures:  EGD  colonoscopy  Antibiotics:  None  HPI/Subjective: No complains, wants to go home.  Objective: Filed Vitals:   08/16/14 1942 08/16/14 2032 08/17/14 0336 08/17/14 0906  BP:  145/99 123/46 143/38  Pulse:  84 70 79  Temp:  98.1 F (36.7 C) 97.6 F (36.4 C) 97.6 F (36.4 C)  TempSrc:  Oral Oral Oral  Resp:  18 18 16   Height:  5\' 3"  (1.6 m)    Weight:  65.3 kg (143 lb 15.4 oz)    SpO2: 94% 95% 97% 95%    Intake/Output  Summary (Last 24 hours) at 08/17/14 1114 Last data filed at 08/17/14 0900  Gross per 24 hour  Intake   4794 ml  Output    455 ml  Net   4339 ml   Filed Weights   08/16/14 1020 08/16/14 2032  Weight: 66.679 kg (147 lb) 65.3 kg (143 lb 15.4 oz)    Exam:  General: Alert, awake, oriented x3, in no acute distress.  HEENT: No bruits, no goiter.  Heart: Regular rate and rhythm. Lungs: Good air movement,clear Abdomen: Soft, nontender, nondistended, positive bowel sounds.  Neuro: Grossly intact, nonfocal.   Data Reviewed: Basic Metabolic Panel:  Recent Labs Lab 08/16/14 1147  NA 136  K 4.4  CL 104  CO2 23  GLUCOSE 110*  BUN 16  CREATININE 0.99  CALCIUM 9.6   Liver Function Tests:  Recent Labs Lab 08/16/14 1147  AST 23  ALT 29  ALKPHOS 28*  BILITOT 0.3  PROT 7.0  ALBUMIN 4.1   No results for input(s): LIPASE, AMYLASE in the last 168 hours. No results for input(s): AMMONIA in the last 168 hours. CBC:  Recent Labs Lab 08/13/14 0938 08/15/14 1602 08/16/14 1147  WBC 8.5 9.8 8.4  NEUTROABS 5.9 6.9  --   HGB 9.6* 9.8* 9.4*  HCT 28.8* 29.3* 28.9*  MCV 104.5* 104.6* 109.1*  PLT 444.0* 389.0 336   Cardiac Enzymes: No results for input(s): CKTOTAL, CKMB, CKMBINDEX, TROPONINI in the last 168 hours. BNP (last 3 results)  Recent Labs  05/31/14 2351  BNP 75.7    ProBNP (last 3 results) No results for input(s): PROBNP in the last 8760 hours.  CBG: No results for input(s): GLUCAP in the last 168 hours.  No results found for this or any previous visit (from the past 240 hour(s)).   Studies: No results found.  Scheduled Meds: . atorvastatin  40 mg Oral Daily  . famotidine  20 mg Oral QHS  . fenofibrate  160 mg Oral Daily  . ipratropium-albuterol  3 mL Nebulization BID  . metoprolol tartrate  12.5 mg Oral BID  . pantoprazole  40 mg Oral Daily   Continuous Infusions: . sodium chloride 20 mL/hr at 08/17/14 0730  . lactated ringers 50 mL/hr at 08/17/14  0911    Time Spent: 25 min   Charlynne Cousins  [facility] Pager [phone]. If 7PM-7AM, please contact night-coverage at [email or web address], password [facility] 08/17/2014, 11:14 AM

## 2014-08-17 NOTE — Op Note (Signed)
[facility] [place], [address]   COLONOSCOPY PROCEDURE REPORT  PATIENT: Ronald Bell, Ronald Bell  MR#: [id number] BIRTHDATE: 09/02/1940 , 30  yrs. old GENDER: male ENDOSCOPIST: Jerene Bears, MD REFERRED [id number] Harley Hallmark, M.D.  Eustace Quail, M.D. [name], DO PROCEDURE DATE:  08/17/2014 PROCEDURE:   Colonoscopy, diagnostic and Colonoscopy with ablation First Screening Colonoscopy - Avg.  risk and is 50 yrs.  old or older - No.  Prior Negative Screening - Now for repeat screening. N/A  History of Adenoma - Now for follow-up colonoscopy & has been > or = to 3 yrs.  N/A  Polyps removed today? No ASA CLASS:   Class III INDICATIONS:melena, heme positive stool, acute anemia, history of cecal angiodysplasia, need for antiplatelets/anti-thrombotic therapy. MEDICATIONS: Monitored anesthesia care and Per Anesthesia  DESCRIPTION OF PROCEDURE:   After the risks benefits and alternatives of the procedure were thoroughly explained, informed consent was obtained.  The digital rectal exam revealed no rectal mass.   The Pentax ultraslim colonoscope was introduced through the anus and advanced to the cecum, which was identified by both the appendix and ileocecal valve. No adverse events experienced.   The quality of the prep was (Colyte was used) good.  The instrument was then slowly withdrawn as the colon was fully examined. Estimated blood loss is zero unless otherwise noted in this procedure report.    COLON FINDINGS: Medium sized angiodysplastic lesion was found at the cecum.  Destruction of lesion via ablation was performed with APC with success.   Small angiodysplastic lesion was found in the ascending colon.  Destruction of lesion via ablation was performed with APC with success was attempted. Care was given to ensure that the lumen was suctioned well.   Two polyps were found in the descending colon (7-8 mm) and rectosigmoid colon (3  mm).  Due to their small size, very low likelihood that they would ever become problematic, recent bleeding and need for anticoagulation, the decision was made not to perform snare polypectomy.  Retroflexed views revealed external hemorrhoids. The time to cecum = 2.5 Withdrawal time = 17.6   The scope was withdrawn and the procedure completed.  COMPLICATIONS: There were no immediate complications.  ENDOSCOPIC IMPRESSION: 1.   Medium sized angiodysplastic lesion at the cecum; ablated with APC 2.   Small angiodysplastic lesion in the ascending colon; ablated with APC 3.   Two polyps were found in the descending colon and rectosigmoid colon; polypectomy not performed due to small size, low likelihood they will ever cause a problem, recent bleeding and the need for anticoagulation  RECOMMENDATIONS: 1.  No NSAIDs 2.  Monitor hemoglobin, replace iron as needed 3.  Would delay antiplatelets or thrombotic medications for at least 1 week to allow time for healing from ablation today 4.  Follow-up with Dr.  Henrene Pastor as needed 5.  Okay for discharge home from GI perspective when medically appropriate  eSigned:  Jerene Bears, MD 08/17/2014 12:39 PM   cc: the patient, cardiology, GI, PCP   PATIENT NAME:  Ronald Bell, Ronald Bell MR#: [id number]

## 2014-08-18 DIAGNOSIS — D62 Acute posthemorrhagic anemia: Secondary | ICD-10-CM | POA: Diagnosis not present

## 2014-08-18 DIAGNOSIS — I739 Peripheral vascular disease, unspecified: Secondary | ICD-10-CM | POA: Diagnosis not present

## 2014-08-18 DIAGNOSIS — K921 Melena: Secondary | ICD-10-CM | POA: Diagnosis not present

## 2014-08-18 DIAGNOSIS — I350 Nonrheumatic aortic (valve) stenosis: Secondary | ICD-10-CM | POA: Diagnosis not present

## 2014-08-18 DIAGNOSIS — K31819 Angiodysplasia of stomach and duodenum without bleeding: Secondary | ICD-10-CM | POA: Diagnosis not present

## 2014-08-18 DIAGNOSIS — K5520 Angiodysplasia of colon without hemorrhage: Secondary | ICD-10-CM | POA: Diagnosis not present

## 2014-08-18 DIAGNOSIS — J449 Chronic obstructive pulmonary disease, unspecified: Secondary | ICD-10-CM | POA: Diagnosis not present

## 2014-08-18 LAB — CBC
HCT: 30.4 % — ABNORMAL LOW (ref 39.0–52.0)
HEMOGLOBIN: 10 g/dL — AB (ref 13.0–17.0)
MCH: 33.7 pg (ref 26.0–34.0)
MCHC: 32.9 g/dL (ref 30.0–36.0)
MCV: 102.4 fL — ABNORMAL HIGH (ref 78.0–100.0)
PLATELETS: 291 10*3/uL (ref 150–400)
RBC: 2.97 MIL/uL — AB (ref 4.22–5.81)
RDW: 18.8 % — ABNORMAL HIGH (ref 11.5–15.5)
WBC: 11.3 10*3/uL — ABNORMAL HIGH (ref 4.0–10.5)

## 2014-08-18 MED ORDER — METOPROLOL TARTRATE 25 MG PO TABS: 12.5000 mg | ORAL_TABLET | Freq: Two times a day (BID) | ORAL | Status: DC

## 2014-08-18 NOTE — Discharge Summary (Signed)
Physician Discharge Summary  Ronald Bell ZTI:[id number] DOB: 11/20/1940 DOA: 08/16/2014  PCP: [name], DO  Admit date: 08/16/2014 Discharge date: 08/18/2014  Time spent: 25 minutes  Recommendations for Outpatient Follow-up:  1. Follow up with GI as an outpatient.   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 male   With significant past medical history including COPD stage III, no on home oxygen, PVD with totally occluded right iliac artery and stent to the left iliac artery, significant CAD and severe aortic stenosis, was under evaluation by cardiology for possible cardiac cath and possible TAVR, he was recently started on plavix, pre procedure lab showed he has drop of hgb from 11 in April to 8.5 in may, plavix was stopped, cardiac procedure which was scheduled on 5/19 was cancelled, patient reported melena, he was sent to GI for further evaluation on 5/25. Patient has known history of AVMs and colonic polyps, last colonoscopy was in 10/2013, It was determined that patient should be admitted to have inpatient EGD and colonoscopy due to significant cardiac and pulmonary history. He is sent for direct admition on 5/26 to hospitalist service.  Hospital Course:   Melena/acute blood loss anemia: - Likely due to angiodysplasia. - Admitted to telemetry there was a drop in hemoglobin from 11->8.5 he status post 1 unit of packed red blood cells Plavix was held. -  EGD and colonoscopy performed on 08/17/2014. Showed multiple angiodysplastic lesions. - No further signs of bleeding. - He will go off antiplatelet therapy to follow-up with GI and cardiology in 2 weeks and resume antiplatelet therapy done.  Preop cardiovascular exam - Cardiology was consulted they deemed high risk for any procedure. - P2 Y 2189, once bleeding site corrected To antiplatelet therapy.-  - Continue statins and beta blocker.  Severe Aortic stenosis - We'll need CABG further recommendations per cardiology.   Procedures:  EGD and colonoscopy that show multiple angiodysplasia.  Consultations:  GI  cardiology  Discharge Exam: Filed Vitals:   08/18/14 0322  BP: 108/63  Pulse: 73  Temp: 98 F (36.7 C)  Resp: 18    General: A&O x3 Cardiovascular: RRR Respiratory: good air movement CTA B/L  Discharge Instructions   Discharge Instructions    Diet - low sodium heart healthy    Complete by:  As directed      Increase activity slowly    Complete by:  As directed           Current Discharge Medication List    START taking these medications   Details  metoprolol tartrate (LOPRESSOR) 25 MG tablet Take 0.5 tablets (12.5 mg total) by mouth 2 (two) times daily. Qty: 60 tablet, Refills: 6      CONTINUE these medications which have NOT CHANGED   Details  amLODipine (NORVASC) 10 MG tablet TAKE 1 TABLET (10 MG TOTAL) BY MOUTH DAILY. Qty: 90 tablet, Refills: 3   Associated Diagnoses: HTN (hypertension)    Ascorbic Acid (  VITAMIN C) 250 MG tablet Take 500 mg by mouth daily.     atorvastatin (LIPITOR) 40 MG tablet Take 1 tablet (40 mg total) by mouth daily. Qty: 90 tablet, Refills: 3   Associated Diagnoses: Other and unspecified hyperlipidemia    BREO ELLIPTA 100-25 MCG/INH AEPB INHALE ONE PUFF INTO LUNGS ONCE DAILY IN THE MORNING Qty: 60 each, Refills: 2    docusate sodium (COLACE) 100 MG capsule Take 100 mg by mouth 2 (two) times daily as needed  (constipation).     famotidine (PEPCID) 20 MG tablet One at bedtime Qty: 30 tablet, Refills: 11    fenofibrate 160 MG tablet TAKE ONE TABLET BY MOUTH ONCE DAILY Qty: 30 tablet, Refills: 5    Ferrous Sulfate (IRON) 325 (65 FE) MG TABS Take 1 tablet by mouth 2 (two) times daily. Qty: 90 each, Refills: 3   Associated Diagnoses: Anemia    Ipratropium-Albuterol (COMBIVENT RESPIMAT) 20-100 MCG/ACT AERS respimat Inhale 1 puff into the lungs 4 (four) times daily as needed for wheezing. Qty: 1 Inhaler, Refills: 3   Associated Diagnoses: Chronic obstructive pulmonary disease, unspecified COPD, unspecified chronic bronchitis type    Multiple Vitamins-Minerals (CENTRUM SILVER PO) Take 1 tablet by mouth daily.     pantoprazole (PROTONIX) 40 MG tablet TAKE ONE TABLET BY MOUTH ONCE DAILY Qty: 90 tablet, Refills: 3   Associated Diagnoses: GERD (gastroesophageal reflux disease)    valsartan-hydrochlorothiazide (DIOVAN HCT) 160-12.5 MG per tablet Take 1 tablet by mouth daily. Qty: 90 tablet, Refills: 3   Associated Diagnoses: HTN (hypertension)    VOLTAREN 1 % GEL Apply 2 g topically 4 (four) times daily as needed (for arthritis).       STOP taking these medications     aspirin 81 MG EC tablet        No Known Allergies    The results of significant diagnostics from this hospitalization (including imaging, microbiology, ancillary and laboratory) are listed below for reference.    Significant Diagnostic Studies: No results found.  Microbiology: No results found for this or any previous visit (from the past 240 hour(s)).   Labs: Basic Metabolic Panel:  Recent Labs Lab 08/16/14 1147  NA 136  K 4.4  CL 104  CO2 23  GLUCOSE 110*  BUN 16  CREATININE 0.99  CALCIUM 9.6   Liver Function Tests:  Recent Labs Lab 08/16/14 1147  AST 23  ALT 29  ALKPHOS 28*  BILITOT 0.3  PROT 7.0  ALBUMIN 4.1   No results for input(s): LIPASE, AMYLASE in the last 168 hours. No results for  input(s): AMMONIA in the last 168 hours. CBC:  Recent Labs Lab 08/13/14 0938 08/15/14 1602 08/16/14 1147 08/18/14 0310  WBC 8.5 9.8 8.4 11.3*  NEUTROABS 5.9 6.9  --   --   HGB 9.6* 9.8* 9.4* 10.0*  HCT 28.8* 29.3* 28.9* 30.4*  MCV 104.5* 104.6* 109.1* 102.4*  PLT 444.0* 389.0 336 291   Cardiac Enzymes: No results for input(s): CKTOTAL, CKMB, CKMBINDEX, TROPONINI in the last 168 hours. BNP: BNP (last 3 results)  Recent Labs  05/31/14 2351  BNP 75.7    ProBNP (last 3 results) No results for input(s): PROBNP in the last 8760 hours.  CBG: No results for input(s): GLUCAP in the last 168 hours.     Signed:  Charlynne Cousins  [facility] 08/18/2014, 9:38 AM

## 2014-08-18 NOTE — Progress Notes (Signed)
Patient discharge teaching given, including activity, diet, follow-up appoints, and medications. Patient verbalized understanding of all discharge instructions. IV access was d/c'd. Vitals are stable. Skin is intact except as charted in most recent assessments. Pt to be escorted out by volunteer, to be driven home by family.  Jamee Keach RN

## 2014-08-21 ENCOUNTER — Encounter (HOSPITAL_COMMUNITY): Payer: Self-pay | Admitting: Internal Medicine

## 2014-08-27 ENCOUNTER — Other Ambulatory Visit: Payer: Self-pay

## 2014-08-27 DIAGNOSIS — I10 Essential (primary) hypertension: Secondary | ICD-10-CM

## 2014-08-27 MED ORDER — VALSARTAN-HYDROCHLOROTHIAZIDE 160-12.5 MG PO TABS: 1.0000 | ORAL_TABLET | Freq: Every day | ORAL | Status: DC

## 2014-08-31 ENCOUNTER — Telehealth: Payer: Self-pay | Admitting: Cardiovascular Disease

## 2014-08-31 NOTE — Telephone Encounter (Signed)
Per note from Dr. Angelena Form scheduled pt to see him on 6/23 at 10:00 to discuss cath and tAVR. Will need CBC.

## 2014-08-31 NOTE — Telephone Encounter (Signed)
I spoke to Ronald Bell today. I would like to see him back in the office on June 23rd at 10am. We will get a CBC that day and then plan PCI of his RCA and then will follow with tAVR planning. Can the triage nurses help arrange the f/u at 10am on 6/23 with me and call him to confirm? [name] is out today. Thanks, chris

## 2014-09-04 ENCOUNTER — Other Ambulatory Visit: Payer: Self-pay

## 2014-09-04 DIAGNOSIS — I10 Essential (primary) hypertension: Secondary | ICD-10-CM

## 2014-09-04 DIAGNOSIS — K219 Gastro-esophageal reflux disease without esophagitis: Secondary | ICD-10-CM

## 2014-09-04 MED ORDER — VALSARTAN-HYDROCHLOROTHIAZIDE 160-12.5 MG PO TABS: 1.0000 | ORAL_TABLET | Freq: Every day | ORAL | Status: DC

## 2014-09-04 MED ORDER — PANTOPRAZOLE SODIUM 40 MG PO TBEC: DELAYED_RELEASE_TABLET | ORAL | Status: DC

## 2014-09-13 ENCOUNTER — Ambulatory Visit (INDEPENDENT_AMBULATORY_CARE_PROVIDER_SITE_OTHER): Payer: Medicare Other | Admitting: Cardiovascular Disease

## 2014-09-13 ENCOUNTER — Encounter: Payer: Self-pay | Admitting: Cardiovascular Disease

## 2014-09-13 VITALS — BP 146/72 | HR 65 | Ht 63.0 in | Wt 149.1 lb

## 2014-09-13 DIAGNOSIS — I25110 Atherosclerotic heart disease of native coronary artery with unstable angina pectoris: Secondary | ICD-10-CM

## 2014-09-13 DIAGNOSIS — I2510 Atherosclerotic heart disease of native coronary artery without angina pectoris: Secondary | ICD-10-CM | POA: Diagnosis not present

## 2014-09-13 DIAGNOSIS — I350 Nonrheumatic aortic (valve) stenosis: Secondary | ICD-10-CM | POA: Diagnosis not present

## 2014-09-13 NOTE — Progress Notes (Signed)
Chief Complaint  Patient presents with  . Shortness of Breath    History of Present Illness: 74 yo male with history of aortic stenosis, severe COPD, peripheral vascular disease, hyperlipidemia, HTN and anemia with prior GI bleeding who is referred today for consultation regarding his aortic valve disease. He has been followed closely over the past year by Dr. Candee Furbish with known aortic valve stenosis. He has recently experienced dyspnea with exertion following left knee arthroscopy in December 2015. Follow up echocardiogram January 2016 with severe aortic stenosis with mean gradient of 34 mm Hg and peak gradient of 60 mm Hg. Aortic valve area was estimated at 0.8 cm2. LV function was normal with LVEF=55%. He was hospitalized in late January 2016 with hypoxic respiratory failure felt to be secondary to COPD exacerbation and was treated with bronchodilators, antibiotics and steroids. He was seen for hospital follow up Dr. Marlou Porch and was referred for pulmonary evaluation with Dr. [name] who felt that a component of his respiratory issues was likely due to congestive heart failure. He was then seen in the CT surgery office by Dr. Roxy Manns 06/21/14 for further evaluation of his aortic stenosis. He has since undergone a right and left heart catheterization per Dr. Marlou Porch on June 28, 2014 and was found to have heavily calcified coronary arteries with severe disease in the mid LAD, mid RCA and moderately severe disease in the ostium of the Circumflex. Prior imaging confirms presence of severe calcification of the thoracic aorta. He has known PAD with occluded right iliac artery and moderate left iliac artery stenosis with prior stent placement. He has history of GI bleeding and was found to have angiodysplasia 7 years ago. He has been followed by GI and takes iron daily but no recent GI bleeding on ASA therapy. He smoked for many years and quit recently. He has stable COPD and does not require supplemental O2  therapy. I met him in April 2016 and we discussed cardiac cath with PCI and TAVR. In preparation for TAVR, I had planned PCI of the RCA with rotablator atherectomy in May 2016. He was started on Plavix and unfortunately developed GI bleeding. 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 Plavix was stopped and the AVMs were treated by GI. He has had no recurrent bleeding.   He is here today for follow up. He is still limited by dyspnea with minimal exertion. He can only walk for 20 feet before becoming dyspneic. He has no chest pain.  He has no dizziness, near syncope or syncope. No lower extremity edema. Weight has been stable. He is concerned today about his wife and her recent illness. She is undergoing a GI surgery next week. He wishes to delay PCI until after her surgery.    Primary Care Physician: Etter Sjogren  Past Medical History  Diagnosis Date  . Polyp of nasal cavity   . Rosacea   . Benign neoplasm of colon   . Tobacco abuse   . Psychosexual dysfunction with inhibited sexual excitement   . COPD   . Hypertension   . Hyperlipidemia   . GERD (gastroesophageal reflux disease)   . Anemia   . Arthritis   . GI bleed 2010    4 units PRBCs  . Adenomatous colon polyp   . Peripheral vascular disease   . Irregular heartbeat   . Shingles   . Carotid artery occlusion   . Angiodysplasia of intestine with hemorrhage   . Pleural plaque with presence of asbestos  03/27/2013    Followed in Pulmonary clinic/ [facility]/ [place] - F/u CT 09/08/2013 1. Stable extensive calcified pleural plaque formation consistent with asbestos exposure. 2. Multiple pulmonary nodules are unchanged from the CT of 6 months ago. Given risk factors for lung cancer, continued follow up is recommended with chest CT in 6 months> done 04/20/14 no change >repeat in 12 m in tickle file     . COPD GOLD III with min reversibilty  08/11/2006    Followed in Pulmonary clinic/ [facility]/ [place] - PFT's 04/28/2013  FEV1 0.88  (40%) with ratio 44 and 14% better p B2 dlco 45 corrects to 83 - Trial of breo 04/28/2013 > improved symptoms  06/09/2013  - spirometry 06/04/2014 FEV1  0.76 (29%) ratio 45      . GERD 11/30/2008    Qualifier: Diagnosis of  By: Marijean Niemann CMA, Danielle    . Iron deficiency anemia 01/25/2009    Qualifier: Diagnosis of  By: Henrene Pastor MD, Docia Chuck   . PVD (peripheral vascular disease) 10/18/2012  . Carotid artery stenosis 04/22/2012    Past Surgical History  Procedure Laterality Date  . Iliac artery stent Left 2005    CIA  . Esophagogastroduodenoscopy  2012    normal  . Colonoscopy  July 2015    Dr. Henrene Pastor  . Tonsillectomy    . Cardiac catheterization  2001  . Knee arthroscopy with medial menisectomy Left 03/08/2014    Procedure: LEFT KNEE SCOPE WITH MEDIAL MENISECTOMY AND CHONDROPLASTY;  Surgeon: Ninetta Lights, MD;  Location: [place];  Service: Orthopedics;  Laterality: Left;  . Left and right heart catheterization with coronary angiogram N/A 06/28/2014    Procedure: LEFT AND RIGHT HEART CATHETERIZATION WITH CORONARY ANGIOGRAM;  Surgeon: Jerline Pain, MD;  Location: [facility] CATH LAB;  Service: Cardiovascular;  Laterality: N/A;  . Esophagogastroduodenoscopy N/A 08/17/2014    Procedure: ESOPHAGOGASTRODUODENOSCOPY (EGD);  Surgeon: Jerene Bears, MD;  Location: [facility] ENDOSCOPY;  Service: Endoscopy;  Laterality: N/A;  . Colonoscopy N/A 08/17/2014    Procedure: COLONOSCOPY;  Surgeon: Jerene Bears, MD;  Location: [facility] ENDOSCOPY;  Service: Endoscopy;  Laterality: N/A;    Current Outpatient Prescriptions  Medication Sig Dispense Refill  . amLODipine (NORVASC) 10 MG tablet Take 10 mg by mouth daily.    . Ascorbic Acid (VITAMIN C) 250 MG tablet Take 500 mg by mouth daily.     Marland Kitchen atorvastatin (LIPITOR) 40 MG tablet Take 1 tablet (40 mg total) by mouth daily. 90 tablet 3  . BREO ELLIPTA 100-25 MCG/INH AEPB INHALE ONE PUFF INTO LUNGS ONCE DAILY IN THE MORNING 60 each 2  . docusate sodium (COLACE) 100 MG capsule Take 100 mg by  mouth 2 (two) times daily as needed (constipation).     . fenofibrate 160 MG tablet Take 160 mg by mouth daily.    . Ferrous Sulfate (IRON) 325 (65 FE) MG TABS Take 1 tablet by mouth 2 (two) times daily. 90 each 3  . Ipratropium-Albuterol (COMBIVENT RESPIMAT) 20-100 MCG/ACT AERS respimat Inhale 1 puff into the lungs 4 (four) times daily as needed for wheezing. 1 Inhaler 3  . metoprolol tartrate (LOPRESSOR) 25 MG tablet Take 0.5 tablets (12.5 mg total) by mouth 2 (two) times daily. 60 tablet 6  . Multiple Vitamins-Minerals (CENTRUM SILVER PO) Take 1 tablet by mouth daily.     . pantoprazole (PROTONIX) 40 MG tablet TAKE ONE TABLET BY MOUTH ONCE DAILY 90 tablet 3  . valsartan-hydrochlorothiazide (DIOVAN HCT) 160-12.5 MG per  tablet Take 1 tablet by mouth daily. 90 tablet 1  . VOLTAREN 1 % GEL Apply 2 g topically 4 (four) times daily as needed (for arthritis).      No current facility-administered medications for this visit.    No Known Allergies  History   Social History  . Marital Status: Married    Spouse Name: N/A  . Number of Children: 4  . Years of Education: N/A   Occupational History  . Charity fundraiser    Social History Main Topics  . Smoking status: Former Smoker -- 0.75 packs/day for 55 years    Types: Cigarettes, E-cigarettes    Quit date: 04/17/2014  . Smokeless tobacco: Never Used     Comment: pt states he knows he needs to quit. Also uses e-cig.  Marland Kitchen Alcohol Use: 2.4 oz/week    4 Glasses of wine per week     Comment: occasional  . Drug Use: No  . Sexual Activity:    Partners: Female   Other Topics Concern  . Not on file   Social History Narrative    Family History  Problem Relation Age of Onset  . Lung disease Mother     pulm fibrosis  . Uterine cancer    . Colitis Father   . Colon cancer Neg Hx   . Heart disease Brother   . Hypertension Brother   . Hyperlipidemia Brother   . Heart disease Daughter     cad  . Hypertension Son     Review of  Systems:  As stated in the HPI and otherwise negative.   BP 146/72 mmHg  Pulse 65  Ht '5\' 3"'  (1.6 m)  Wt 149 lb 1.9 oz (67.64 kg)  BMI 26.42 kg/m2  Physical Examination: General: Well developed, well nourished, NAD HEENT: OP clear, mucus membranes moist SKIN: warm, dry. No rashes. Neuro: No focal deficits Musculoskeletal: Muscle strength 5/5 all ext Psychiatric: Mood and affect normal Neck: No JVD, no carotid bruits, no thyromegaly, no lymphadenopathy. Lungs:Clear bilaterally, no wheezes, rhonci, crackles Cardiovascular: Regular rate and rhythm. Harsh systolic murmur. No gallops or rubs. Abdomen:Soft. Bowel sounds present. Non-tender.  Extremities: No lower extremity edema. Left femoral artery pulse is 1+. No right femoral artery pulse.   Cardiac cath 06/28/14: HEMODYNAMICS:  Right atrium (RA): 12/10/8 mmHg Right ventricle (RV): 41/3/10 mmHg Pulmonary artery (PA): 39/17/26 mmHg Pulmonary capillary wedge pressure (PCWP): 17/14/14 mmHg Cardiac output: Fick 4.87 liters/min Cardiac index: Fick 2.99 PA saturation: 67% FA saturation: 96% Aortic pressure: 148/47 mmHg LV pressure: 188 mmHg systolic; LVEDP 17 mmHg.  Peak to peak gradient between the left ventricle and aorta was 77mHg, mean 41.222mg, Calculated valve area 0.67cm squared.  ANGIOGRAPHIC DATA:  Left main: Calcified vessel branching into circumflex as well as LAD, no significant stenosis. Left anterior descending (LAD): Densely calcified vessel with severe proximal/mid LAD stenosis surrounding major septal branch of approximately 90% at its highest, continuing to large diagonal branch. Disease length is approximately 15-20 mm. Major diagonal branch also has 70% proximal stenosis. Circumflex artery (CIRC): In the RAO caudal view there appears to be ostial/proximal circumflex disease, perhaps 90% stenosis. This vessel is also densely calcified and is fairly small in caliber distally. There are 2 small obtuse marginal  branches. Right coronary artery (RCA): This vessel is also densely calcified. There is a significant 90% stenosis in the mid RCA. There is also 50% stenosis in the proximal RCA at the first major bend of the vessel. This vessel is dominant giving  rise to the posterior descending artery. It is fairly large in caliber. LEFT VENTRICULOGRAM: Left ventricular angiogram was done in the 30 RAO projection with hand injection and revealed normal left ventricular wall motion and systolic function with an estimated ejection fraction of 65%.   Echo 04/06/14: - Left ventricle: The cavity size was is borderline dilated. Wall thickness was normal. Systolic function was normal. The estimated ejection fraction was in the range of 55% to 60%. Wall motion was normal; there were no regional wall motion abnormalities. Doppler parameters are consistent with abnormal left ventricular relaxation (grade 1 diastolic dysfunction). The E/e&' ratio is between 8-15, suggesting indeterminate LV filling pressure. - Aortic valve: Heavily calcified aortic valve leaflets. Peak and mean gradients of 60 and 34 mmHg, respectively. Based on an LVOT diameter of 2.0 cm, the calculated AVA is 0.8 cm2, consistent with severe aortic stenosis. There is mild regurgitation. Valve area (VTI): 0.74 cm^2. Valve area (Vmean): 0.68 cm^2. - Mitral valve: Calcified annulus. Mildly thickened leaflets . There was trivial regurgitation. - Left atrium: Severely dilated at 49 ml/m2.  Impressions:  - LVEF 55-60%, normal wall thickness, borderline dilated LV, normal wall motion, diastolic dysfunction, likely elevated LV filling pressure, there is severe aortic stenosis, peak and mean gradients of 60 mmHG and 35 mmHg, respectively. AVA is around 0.8 cm2. Mild AI, severely dilated LA  CTA chest/abd/pelvis 07/09/14:  Mediastinum/Lymph Nodes: Heart size is borderline enlarged. There is no significant pericardial  fluid, thickening or pericardial calcification. There is atherosclerosis of the thoracic aorta, the great vessels of the mediastinum and the coronary arteries, including calcified atherosclerotic plaque in the left main, left anterior descending, left circumflex and right coronary arteries. In addition, there is high-grade stenosis at the origin of the left subclavian artery, and in the proximal right subclavian artery. Severe thickening calcifications of the aortic valve. No pathologically enlarged mediastinal or hilar lymph nodes. Esophagus is unremarkable in appearance. No axillary lymphadenopathy.  Lungs/Pleura: Extensive calcified pleural plaques are noted throughout the thorax bilaterally, indicative of asbestos related pleural disease. Multiple pulmonary nodules are noted in the lungs bilaterally, largest which measures 8 x 4 mm in the apex of the right upper lobe (image 7 of series 403). These nodules are generally similar in size, number and distribution to remote prior study 03/10/2013, and are considered likely benign. No acute consolidative airspace disease. No pleural effusions. No definite fibrotic changes.  Musculoskeletal/Soft Tissues: There are no aggressive appearing lytic or blastic lesions noted in the visualized portions of the skeleton.  CTA ABDOMEN AND PELVIS FINDINGS  Hepatobiliary: 8 mm low attenuation lesion in the medial aspect of segment 6 of the liver is too small to characterize, but is statistically likely to represent a small cyst. No other suspicious appearing hepatic lesions are noted. No intra or extrahepatic biliary ductal dilatation. Tiny calcified gallstone lying dependently in the gallbladder. No current findings to suggest an acute cholecystitis at this time.  Pancreas: Unremarkable.  Spleen: Unremarkable.  Adrenals/Urinary Tract: Sub cm low-attenuation lesion in the upper pole of the right kidney is too small to definitively  characterize, but is favored to represent a tiny cyst. Left kidney is normal in appearance. Bilateral adrenal glands are normal in appearance. No hydroureteronephrosis. Urinary bladder is unremarkable.  Stomach/Bowel: The appearance of the stomach is normal. No pathologic dilatation of small bowel or colon. A few scattered colonic diverticulae are noted, without surrounding inflammatory changes to suggest an acute diverticulitis at this time. Normal appendix.  Vascular/Lymphatic: Vascular  findings and measurements pertinent to potential TAVR procedure, as detailed below. Severe atherosclerosis. 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 Probable stent in the left common iliac artery distally.  Reproductive: Prostate gland and seminal vesicles are unremarkable in appearance. Bilateral vasectomy clips.  Other: No significant volume of ascites. No pneumoperitoneum.  Musculoskeletal: Small well-defined sclerotic lesion in the left ilium adjacent to the sacroiliac joint, favored to represent a tiny bone island. There are no aggressive appearing lytic or blastic lesions noted in the visualized portions of the skeleton.  VASCULAR MEASUREMENTS PERTINENT TO TAVR:  AORTA:  Minimal Aortic Diameter - 10 x 9 mm  Severity of Aortic Calcification - severe  RIGHT PELVIS:  Right Common Iliac Artery -  Minimal Diameter - 6.8 x 3.6 mm  Tortuosity - mild  Calcification - severe  Right External Iliac Artery -  Minimal Diameter - 3.5 x 2.7 mm  Tortuosity - moderate  Calcification - severe  Right Common Femoral Artery -  Minimal Diameter - 5.3 x 3.2 mm  Tortuosity - mild  Calcification - severe  LEFT PELVIS:  Left Common Iliac Artery -  Minimal Diameter - 3.9 x 3.7 mm  Tortuosity  - mild  Calcification - severe  Left External Iliac Artery -  Minimal Diameter - 4.5 x 3.1 mm  Tortuosity - moderate  Calcification - severe  Left Common Femoral Artery -  Minimal Diameter - 2.8 x 2.1 mm  Tortuosity - mild  Calcification - severe  Review of the MIP images confirms the above findings.  IMPRESSION: 1. Severe atherosclerosis, particularly throughout the pelvic arterial access vessels, as detailed above. This patient does not have suitable pelvic arterial access for TAVR procedure. 2. 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 3. Asbestos related pleural disease. No fibrotic changes in the lung parenchyma to suggest asbestosis at this time. 4. Multiple small pulmonary nodules scattered throughout the lungs bilaterally, similar in size, number and distribution to prior examinations dating back to 03/10/2013, strongly favored to be benign. Attention on one additional noncontrast chest CT in 12 months is suggested to confirm greater than 24 months of stability. 5. Cholelithiasis without evidence of acute cholecystitis at this time. 6. Mild colonic diverticulosis without evidence of acute diverticulitis at this time. 7. Additional incidental findings, as above.  Cardiac CT 07/09/14: Aortic Valve: Trileaflet and heavily calcified. Mild annular calcification near base of left cusp. No significant MAC.  Aorta: Porcelin aortic root with severe calcification through out the arch and descending thoracic aorta as well. Bilateral proximal subclavian artery stenosis  Sinotubular Junction: 2.2 cm  Ascending Thoracic Aorta: 2.8 cm  Descending Thoracic Aorta: 2.2 cm  Sinus of Valsalva Measurements:  Non-coronary: 29 mm  Right -coronary: 26 mm  Left -coronary: 28.5 mm  Coronary Artery Height above Annulus:  Left Main: 15.4 mm  Right Coronary: 14.8 mm  Virtual Basal  Annulus Measurements:  Maximum/Minimum Diameter: 26.7 mm x 22.4 mm  Perimeter: 77 mm  Area: 465 mm2  Coronary Arteries: Sufficient height above annulus for delivery heavily calcified right dominant  Optimum Fluoroscopic Angle for Delivery: LAO 4 degress Cranial 1 degree  IMPRESSION: 1) Aortic annulus area of 465 mm2 suitable for 26 mm Sapien 3 valve  2) 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 The ekg ordered today demonstrates NSR, rate 65 bpm. Non-specific ST and T wave abnormalities. Unchanged.   Recent Labs: 05/31/2014: B Natriuretic Peptide 75.7 06/01/2014: TSH 1.206 08/16/2014: ALT 29; BUN 16; Creatinine, Ser 0.99; Potassium 4.4; Sodium 136 08/18/2014: Hemoglobin 10.0*; Platelets 291   Lipid Panel    Component Value Date/Time   CHOL 154 05/10/2014 1124   TRIG 125.0 05/10/2014 1124   TRIG 102 02/26/2006 1044   HDL 55.40 05/10/2014 1124   CHOLHDL 3 05/10/2014 1124   CHOLHDL 2.8 CALC 02/26/2006 1044   VLDL 25.0 05/10/2014 1124   LDLCALC 74 05/10/2014 1124     Wt Readings from Last 3 Encounters:  09/13/14 149 lb 1.9 oz (67.64 kg)  08/16/14 143 lb 15.4 oz (65.3 kg)  08/15/14 147 lb 4 oz (66.792 kg)     Other studies Reviewed: Additional studies/ records that were reviewed today include: Cardiac cath, echo, CT chest, EKG  Assessment and Plan:   1. Severe aortic valve stenosis: He has stage D severe symptomatic aortic stenosis. His dyspnea is likely multi-factorial with underlying lung disease, AS and three vessel CAD. I have personally reviewed his echo images and agree that his aortic valve is heavily calcified with limited leaflet mobility. His LV function is normal. His aortic valve disease is most certainly contributing at least in part to his symptoms. Slight  elevation pulmonary pressures by cath. He will be high risk for traditional AVR given his age, underlying lung disease and presence of a porcelain aorta. He will also be high risk for TAVR given his comorbidities. His CTA demonstrates severe pelvic artery disease. He will not be a candidate for transfemoral approach to TAVR. He also has a porcelain thoracic aorta. He will be best approached with trans-apical approach to TAVR with a 26 mm Sapien 3 aortic valve. Further planning for TAVR will center around definitive treatment of his severe CAD (see below).   2. Severe triple vessel CAD: He has triple vessel CAD by recent cath with heavily calcified coronary arteries. There is moderate focal disease in the mid LAD with severe mid RCA stenosis and moderately severe disease in the ostium of the Circumflex. I have reviewed his cath images personally and with the TAVR team. He is not an optimal candidate for CABG for revascularization given the porcelain aorta and severe COPD. I have discussed PCI of the RCA which will require rotablator atherectomy. We had planned this last month but he had GI bleeding due to AVMs when loaded with Plavix. He has had no further bleeding after treatment of AVMs. He wishes to proceed with PCI of the RCA but will call back to schedule as his wife is having major surgery next week. When he calls back, will arrange PCI and will plan on loading Plavix on the morning of the cath and then plan rotablator atherectomy of the RCA with a bare metal stent. PCI will once again potentially be complicated by GI bleeding given his history of GI bleeding with AVMs.    Current medicines are reviewed at length with the patient today.  The patient does not have concerns regarding medicines.  The following changes have been made:  no change  Labs/ tests ordered today include:   Orders Placed This Encounter  Procedures  . EKG 12-Lead    Disposition:   He will call back  to arrange cath/PCI. He  will need pre-cath labs the week of the procedure.     Signed, Lauree Chandler, MD 09/13/2014 12:43 PM    [address], [place], [place]  [address] Phone: [phone]; Fax: [phone]

## 2014-09-13 NOTE — Patient Instructions (Signed)
Call us when you are ready to schedule procedure.

## 2014-09-26 ENCOUNTER — Other Ambulatory Visit: Payer: Self-pay | Admitting: Internal Medicine

## 2014-10-03 ENCOUNTER — Other Ambulatory Visit: Payer: Self-pay

## 2014-10-03 MED ORDER — AMLODIPINE BESYLATE 10 MG PO TABS: 10.0000 mg | ORAL_TABLET | Freq: Every day | ORAL | Status: DC

## 2014-10-12 ENCOUNTER — Telehealth: Payer: Self-pay | Admitting: Cardiovascular Disease

## 2014-10-12 DIAGNOSIS — I2510 Atherosclerotic heart disease of native coronary artery without angina pectoris: Secondary | ICD-10-CM

## 2014-10-12 NOTE — Telephone Encounter (Signed)
I spoke with the patient and scheduled with PCI for 10/19/14 with Dr. Angelena Form. I advised the patient he will need to come for pre-procedure labs. He will come Monday 10/15/14 as he has some questions for Dr. Angelena Form. I advised him that I would leave his information in regards to his cath for [facility] and let her know he will be her Monday for labs. I explained to him that Dr. Angelena Form is in clinic and that his clinic is full that morning and I cannot put him on the schedule. I also explained we would need to clarify, in regards to his loading dose for plavix, if he needs this prior to arrival at the hospital or if he will receive this on admission. Will forward to [facility] as an FYI and that clarify in regards to plavix prior to completing instruction sheet.

## 2014-10-12 NOTE — Telephone Encounter (Signed)
New Message  Pt requested to speak w/ RN- pt stated that plans for operation had to stop due to family issues. Pt stated he would like to speak w/ RN about scheduling date/time for procedure. Please call back and discuss.

## 2014-10-15 ENCOUNTER — Other Ambulatory Visit (INDEPENDENT_AMBULATORY_CARE_PROVIDER_SITE_OTHER): Payer: Medicare Other | Admitting: *Deleted

## 2014-10-15 DIAGNOSIS — I2510 Atherosclerotic heart disease of native coronary artery without angina pectoris: Secondary | ICD-10-CM | POA: Diagnosis not present

## 2014-10-15 LAB — BASIC METABOLIC PANEL
BUN: 17 mg/dL (ref 6–23)
CO2: 25 mEq/L (ref 19–32)
CREATININE: 1 mg/dL (ref 0.40–1.50)
Calcium: 10 mg/dL (ref 8.4–10.5)
Chloride: 102 mEq/L (ref 96–112)
GFR: 77.18 mL/min (ref >60.00)
Glucose, Bld: 111 mg/dL — ABNORMAL HIGH (ref 70–99)
Potassium: 4 mEq/L (ref 3.5–5.1)
Sodium: 135 mEq/L (ref 135–145)

## 2014-10-15 LAB — CBC WITH DIFFERENTIAL/PLATELET
Basophils Absolute: 0.1 10*3/uL (ref 0.0–0.1)
Basophils Relative: 0.6 % (ref 0.0–3.0)
EOS PCT: 3.3 % (ref 0.0–5.0)
Eosinophils Absolute: 0.3 10*3/uL (ref 0.0–0.7)
HCT: 46.2 % (ref 39.0–52.0)
Hemoglobin: 15.4 g/dL (ref 13.0–17.0)
LYMPHS ABS: 1.1 10*3/uL (ref 0.7–4.0)
Lymphocytes Relative: 11.9 % — ABNORMAL LOW (ref 12.0–46.0)
MCHC: 33.3 g/dL (ref 30.0–36.0)
MCV: 98.9 fl (ref 78.0–100.0)
MONO ABS: 1.3 10*3/uL — AB (ref 0.1–1.0)
Monocytes Relative: 14.3 % — ABNORMAL HIGH (ref 3.0–12.0)
NEUTROS PCT: 69.9 % (ref 43.0–77.0)
Neutro Abs: 6.4 10*3/uL (ref 1.4–7.7)
PLATELETS: 278 10*3/uL (ref 150.0–400.0)
RBC: 4.67 Mil/uL (ref 4.22–5.81)
RDW: 14.1 % (ref 11.5–15.5)
WBC: 9.2 10*3/uL (ref 4.0–10.5)

## 2014-10-15 LAB — PROTIME-INR
INR: 1.2 ratio — ABNORMAL HIGH (ref 0.8–1.0)
Prothrombin Time: 13.6 s — ABNORMAL HIGH (ref 9.6–13.1)

## 2014-10-15 NOTE — Telephone Encounter (Signed)
Pt spoke with Dr. Angelena Form today when here for lab work.  Pt aware to arrive at Entrance A of [facility] at 7 AM on July 29.

## 2014-10-15 NOTE — Telephone Encounter (Signed)
Agree. Labs today. We will load with Plavix morning of PCI. cdm

## 2014-10-17 ENCOUNTER — Other Ambulatory Visit: Payer: Self-pay | Admitting: Family Medicine

## 2014-10-19 ENCOUNTER — Encounter (HOSPITAL_COMMUNITY): Payer: Self-pay | Admitting: Cardiovascular Disease

## 2014-10-19 ENCOUNTER — Ambulatory Visit (HOSPITAL_COMMUNITY)
Admission: RE | Admit: 2014-10-19 | Discharge: 2014-10-20 | Disposition: A | Discharge status: Home / Self Care | Payer: Medicare Other | Source: Ambulatory Visit | Attending: Cardiovascular Disease | Admitting: Cardiovascular Disease

## 2014-10-19 ENCOUNTER — Encounter (HOSPITAL_COMMUNITY)
Admission: RE | Disposition: A | Discharge status: Home / Self Care | Payer: Self-pay | Source: Ambulatory Visit | Attending: Cardiovascular Disease

## 2014-10-19 DIAGNOSIS — Z95820 Peripheral vascular angioplasty status with implants and grafts: Secondary | ICD-10-CM | POA: Insufficient documentation

## 2014-10-19 DIAGNOSIS — D509 Iron deficiency anemia, unspecified: Secondary | ICD-10-CM | POA: Diagnosis not present

## 2014-10-19 DIAGNOSIS — K219 Gastro-esophageal reflux disease without esophagitis: Secondary | ICD-10-CM | POA: Insufficient documentation

## 2014-10-19 DIAGNOSIS — E785 Hyperlipidemia, unspecified: Secondary | ICD-10-CM | POA: Diagnosis not present

## 2014-10-19 DIAGNOSIS — I739 Peripheral vascular disease, unspecified: Secondary | ICD-10-CM | POA: Diagnosis not present

## 2014-10-19 DIAGNOSIS — Z87891 Personal history of nicotine dependence: Secondary | ICD-10-CM | POA: Diagnosis not present

## 2014-10-19 DIAGNOSIS — R001 Bradycardia, unspecified: Secondary | ICD-10-CM | POA: Diagnosis not present

## 2014-10-19 DIAGNOSIS — I10 Essential (primary) hypertension: Secondary | ICD-10-CM | POA: Diagnosis not present

## 2014-10-19 DIAGNOSIS — I200 Unstable angina: Secondary | ICD-10-CM | POA: Diagnosis present

## 2014-10-19 DIAGNOSIS — I350 Nonrheumatic aortic (valve) stenosis: Secondary | ICD-10-CM | POA: Insufficient documentation

## 2014-10-19 DIAGNOSIS — I2583 Coronary atherosclerosis due to lipid rich plaque: Secondary | ICD-10-CM

## 2014-10-19 DIAGNOSIS — I2510 Atherosclerotic heart disease of native coronary artery without angina pectoris: Secondary | ICD-10-CM

## 2014-10-19 DIAGNOSIS — I25110 Atherosclerotic heart disease of native coronary artery with unstable angina pectoris: Secondary | ICD-10-CM | POA: Insufficient documentation

## 2014-10-19 DIAGNOSIS — J449 Chronic obstructive pulmonary disease, unspecified: Secondary | ICD-10-CM | POA: Insufficient documentation

## 2014-10-19 DIAGNOSIS — I2584 Coronary atherosclerosis due to calcified coronary lesion: Secondary | ICD-10-CM | POA: Diagnosis not present

## 2014-10-19 HISTORY — PX: CARDIAC CATHETERIZATION: SHX172

## 2014-10-19 HISTORY — DX: Personal history of other medical treatment: Z92.89

## 2014-10-19 HISTORY — DX: Atherosclerotic heart disease of native coronary artery without angina pectoris: I25.10

## 2014-10-19 LAB — POCT ACTIVATED CLOTTING TIME: Activated Clotting Time: 927 seconds

## 2014-10-19 SURGERY — CORONARY STENT INTERVENTION
Anesthesia: LOCAL

## 2014-10-19 MED ORDER — SODIUM CHLORIDE 0.9 % IJ SOLN: 3.0000 mL | Freq: Two times a day (BID) | INTRAMUSCULAR | Status: DC

## 2014-10-19 MED ORDER — IPRATROPIUM-ALBUTEROL 0.5-2.5 (3) MG/3ML IN SOLN: 3.0000 mL | Freq: Four times a day (QID) | RESPIRATORY_TRACT | Status: DC | PRN

## 2014-10-19 MED ORDER — IPRATROPIUM-ALBUTEROL 20-100 MCG/ACT IN AERS
1.0000 | INHALATION_SPRAY | Freq: Four times a day (QID) | RESPIRATORY_TRACT | Status: DC | PRN
  Filled 2014-10-19: qty 4

## 2014-10-19 MED ORDER — LIDOCAINE HCL (PF) 1 % IJ SOLN
INTRAMUSCULAR | Status: DC | PRN
  Administered 2014-10-19: 11:00:00

## 2014-10-19 MED ORDER — CLOPIDOGREL BISULFATE 300 MG PO TABS
ORAL_TABLET | ORAL | Status: AC
  Filled 2014-10-19: qty 1

## 2014-10-19 MED ORDER — SODIUM CHLORIDE 0.9 % WEIGHT BASED INFUSION
3.0000 mL/kg/h | INTRAVENOUS | Status: DC
  Administered 2014-10-19 (×2): 3 mL/kg/h via INTRAVENOUS

## 2014-10-19 MED ORDER — MIDAZOLAM HCL 2 MG/2ML IJ SOLN
INTRAMUSCULAR | Status: AC
  Filled 2014-10-19: qty 2

## 2014-10-19 MED ORDER — IRON 325 (65 FE) MG PO TABS
1.0000 | ORAL_TABLET | Freq: Two times a day (BID) | ORAL | Status: DC
  Filled 2014-10-19: qty 1

## 2014-10-19 MED ORDER — CLOPIDOGREL BISULFATE 300 MG PO TABS
ORAL_TABLET | ORAL | Status: AC
  Administered 2014-10-19: 600 mg via ORAL
  Filled 2014-10-19: qty 1

## 2014-10-19 MED ORDER — ASPIRIN 81 MG PO CHEW
81.0000 mg | CHEWABLE_TABLET | Freq: Every day | ORAL | Status: DC
  Administered 2014-10-20: 81 mg via ORAL
  Filled 2014-10-19: qty 1

## 2014-10-19 MED ORDER — BIVALIRUDIN BOLUS VIA INFUSION - CUPID
INTRAVENOUS | Status: DC | PRN
  Administered 2014-10-19: 47.625 mg via INTRAVENOUS

## 2014-10-19 MED ORDER — CLOPIDOGREL BISULFATE 300 MG PO TABS
600.0000 mg | ORAL_TABLET | Freq: Once | ORAL | Status: AC
  Administered 2014-10-19: 600 mg via ORAL

## 2014-10-19 MED ORDER — FERROUS SULFATE 325 (65 FE) MG PO TABS
325.0000 mg | ORAL_TABLET | Freq: Two times a day (BID) | ORAL | Status: DC
  Administered 2014-10-19 – 2014-10-20 (×2): 325 mg via ORAL
  Filled 2014-10-19 (×2): qty 1

## 2014-10-19 MED ORDER — HEPARIN (PORCINE) IN NACL 2-0.9 UNIT/ML-% IJ SOLN
INTRAMUSCULAR | Status: AC
  Filled 2014-10-19: qty 1500

## 2014-10-19 MED ORDER — METOPROLOL TARTRATE 12.5 MG HALF TABLET
12.5000 mg | ORAL_TABLET | Freq: Two times a day (BID) | ORAL | Status: DC
  Administered 2014-10-19 – 2014-10-20 (×2): 12.5 mg via ORAL
  Filled 2014-10-19 (×2): qty 1

## 2014-10-19 MED ORDER — CLOPIDOGREL BISULFATE 75 MG PO TABS
75.0000 mg | ORAL_TABLET | Freq: Every day | ORAL | Status: DC
  Administered 2014-10-20: 75 mg via ORAL
  Filled 2014-10-19: qty 1

## 2014-10-19 MED ORDER — ONDANSETRON HCL 4 MG/2ML IJ SOLN
INTRAMUSCULAR | Status: DC | PRN
  Administered 2014-10-19: 4 mg via INTRAVENOUS

## 2014-10-19 MED ORDER — SODIUM CHLORIDE 0.9 % WEIGHT BASED INFUSION: 1.0000 mL/kg/h | INTRAVENOUS | Status: DC

## 2014-10-19 MED ORDER — BIVALIRUDIN 250 MG IV SOLR
250.0000 mg | INTRAVENOUS | Status: DC | PRN
  Administered 2014-10-19: 1.75 mg/kg/h via INTRAVENOUS

## 2014-10-19 MED ORDER — BIVALIRUDIN 250 MG IV SOLR
INTRAVENOUS | Status: AC
  Filled 2014-10-19: qty 250

## 2014-10-19 MED ORDER — PANTOPRAZOLE SODIUM 40 MG PO TBEC
40.0000 mg | DELAYED_RELEASE_TABLET | Freq: Every day | ORAL | Status: DC
  Administered 2014-10-20: 40 mg via ORAL
  Filled 2014-10-19 (×2): qty 1

## 2014-10-19 MED ORDER — SODIUM CHLORIDE 0.9 % IJ SOLN
3.0000 mL | Freq: Two times a day (BID) | INTRAMUSCULAR | Status: DC
  Administered 2014-10-19: 3 mL via INTRAVENOUS

## 2014-10-19 MED ORDER — FENOFIBRATE 160 MG PO TABS
160.0000 mg | ORAL_TABLET | Freq: Every day | ORAL | Status: DC
  Administered 2014-10-20: 160 mg via ORAL
  Filled 2014-10-19: qty 1

## 2014-10-19 MED ORDER — SODIUM CHLORIDE 0.9 % IJ SOLN: 3.0000 mL | INTRAMUSCULAR | Status: DC | PRN

## 2014-10-19 MED ORDER — LIDOCAINE HCL (PF) 1 % IJ SOLN
INTRAMUSCULAR | Status: AC
  Filled 2014-10-19: qty 30

## 2014-10-19 MED ORDER — VERAPAMIL HCL 2.5 MG/ML IV SOLN
INTRAVENOUS | Status: DC | PRN
  Administered 2014-10-19: 10:00:00 via INTRACORONARY

## 2014-10-19 MED ORDER — NITROGLYCERIN 1 MG/10 ML FOR IR/CATH LAB
INTRA_ARTERIAL | Status: AC
  Filled 2014-10-19: qty 10

## 2014-10-19 MED ORDER — ACETAMINOPHEN 325 MG PO TABS: 650.0000 mg | ORAL_TABLET | ORAL | Status: DC | PRN

## 2014-10-19 MED ORDER — ASPIRIN 81 MG PO CHEW
81.0000 mg | CHEWABLE_TABLET | ORAL | Status: DC
Start: 2014-10-19 — End: 2014-10-19

## 2014-10-19 MED ORDER — HYDROCHLOROTHIAZIDE 12.5 MG PO CAPS
12.5000 mg | ORAL_CAPSULE | Freq: Every day | ORAL | Status: DC
  Administered 2014-10-20: 09:00:00 12.5 mg via ORAL
  Filled 2014-10-19: qty 1

## 2014-10-19 MED ORDER — ATROPINE SULFATE 0.1 MG/ML IJ SOLN
INTRAMUSCULAR | Status: AC
  Administered 2014-10-19: 1 mg
  Filled 2014-10-19: qty 10

## 2014-10-19 MED ORDER — FENTANYL CITRATE (PF) 100 MCG/2ML IJ SOLN
INTRAMUSCULAR | Status: AC
  Filled 2014-10-19: qty 2

## 2014-10-19 MED ORDER — OXYMETAZOLINE HCL 0.05 % NA SOLN
1.0000 | Freq: Two times a day (BID) | NASAL | Status: DC
  Administered 2014-10-19: 1 via NASAL
  Filled 2014-10-19: qty 15

## 2014-10-19 MED ORDER — IRBESARTAN 75 MG PO TABS
150.0000 mg | ORAL_TABLET | Freq: Every day | ORAL | Status: DC
  Administered 2014-10-20: 150 mg via ORAL
  Filled 2014-10-19: qty 2

## 2014-10-19 MED ORDER — IOHEXOL 350 MG/ML SOLN
INTRAVENOUS | Status: DC | PRN
  Administered 2014-10-19: 95 mL via INTRAVENOUS

## 2014-10-19 MED ORDER — AMLODIPINE BESYLATE 5 MG PO TABS
10.0000 mg | ORAL_TABLET | Freq: Every day | ORAL | Status: DC
  Administered 2014-10-20: 10 mg via ORAL
  Filled 2014-10-19: qty 2

## 2014-10-19 MED ORDER — MIDAZOLAM HCL 2 MG/2ML IJ SOLN
INTRAMUSCULAR | Status: DC | PRN
  Administered 2014-10-19 (×2): 1 mg via INTRAVENOUS

## 2014-10-19 MED ORDER — VERAPAMIL HCL 2.5 MG/ML IV SOLN
INTRAVENOUS | Status: AC
  Filled 2014-10-19: qty 4

## 2014-10-19 MED ORDER — VALSARTAN-HYDROCHLOROTHIAZIDE 160-12.5 MG PO TABS
1.0000 | ORAL_TABLET | Freq: Every day | ORAL | Status: DC
Start: 2014-10-19 — End: 2014-10-19

## 2014-10-19 MED ORDER — SODIUM CHLORIDE 0.9 % IV SOLN: 250.0000 mL | INTRAVENOUS | Status: DC | PRN

## 2014-10-19 MED ORDER — ONDANSETRON HCL 4 MG/2ML IJ SOLN: 4.0000 mg | Freq: Four times a day (QID) | INTRAMUSCULAR | Status: DC | PRN

## 2014-10-19 MED ORDER — FENTANYL CITRATE (PF) 100 MCG/2ML IJ SOLN
INTRAMUSCULAR | Status: DC | PRN
  Administered 2014-10-19: 25 ug via INTRAVENOUS

## 2014-10-19 MED ORDER — ATORVASTATIN CALCIUM 40 MG PO TABS
40.0000 mg | ORAL_TABLET | Freq: Every day | ORAL | Status: DC
  Administered 2014-10-20: 09:00:00 40 mg via ORAL
  Filled 2014-10-19 (×2): qty 1

## 2014-10-19 MED ORDER — ONDANSETRON HCL 4 MG/2ML IJ SOLN
INTRAMUSCULAR | Status: AC
  Filled 2014-10-19: qty 2

## 2014-10-19 MED ORDER — NITROGLYCERIN IN D5W 200-5 MCG/ML-% IV SOLN
INTRAVENOUS | Status: AC
  Filled 2014-10-19: qty 250

## 2014-10-19 MED ORDER — ASPIRIN 81 MG PO CHEW: 81.0000 mg | CHEWABLE_TABLET | ORAL | Status: DC

## 2014-10-19 MED ORDER — SODIUM CHLORIDE 0.9 % IV SOLN: INTRAVENOUS | Status: AC

## 2014-10-19 SURGICAL SUPPLY — 26 items
BALLN EMERGE MR 2.5X12 (BALLOONS) ×3
BALLN ~~LOC~~ EUPHORA RX 3.75X12 (BALLOONS) ×3
BALLOON EMERGE MR 2.5X12 (BALLOONS) IMPLANT
BALLOON ~~LOC~~ EUPHORA RX 3.75X12 (BALLOONS) IMPLANT
BURR ROTALINK 1.75MM (BURR) ×1 IMPLANT
CATH INFINITI JR4 5F (CATHETERS) ×1 IMPLANT
CATH ROTALINK PLUS 1.50MM (BURR) ×1 IMPLANT
CATH S G BIP PACING (SET/KITS/TRAYS/PACK) ×1 IMPLANT
CATH VISTA GUIDE 7FR JR4 (CATHETERS) ×1 IMPLANT
ELECT DEFIB PAD ADLT CADENCE (PAD) ×1 IMPLANT
GUIDEWIRE ANGLED .035X150CM (WIRE) ×1 IMPLANT
KIT ENCORE 26 ADVANTAGE (KITS) ×4 IMPLANT
KIT HEART LEFT (KITS) ×3 IMPLANT
LUBRICANT ROTAGLIDE 20CC VIAL (MISCELLANEOUS) ×1 IMPLANT
PACK CARDIAC CATHETERIZATION (CUSTOM PROCEDURE TRAY) ×3 IMPLANT
PINNACLE LONG 7F 25CM (SHEATH) ×3
SHEATH INTRO PINNACLE 7F 25CM (SHEATH) IMPLANT
SHEATH PINNACLE 6F 10CM (SHEATH) ×1 IMPLANT
STENT REBEL MR 3.5X16 (Permanent Stent) ×1 IMPLANT
TRANSDUCER W/STOPCOCK (MISCELLANEOUS) ×3 IMPLANT
TUBING ART PRESS 72  MALE/FEM (TUBING) ×1
TUBING ART PRESS 72 MALE/FEM (TUBING) IMPLANT
TUBING CIL FLEX 10 FLL-RA (TUBING) ×3 IMPLANT
WIRE EMERALD 3MM-J .035X260CM (WIRE) ×1 IMPLANT
WIRE HI TORQ VERSACORE-J 145CM (WIRE) ×1 IMPLANT
WIRE ROTA FLOPPY .009X325CM (WIRE) ×1 IMPLANT

## 2014-10-19 NOTE — Interval H&P Note (Signed)
History and Physical Interval Note:  10/19/2014 9:09 AM  Ronald Bell  has presented today for cardiac cath/PCI/rotablator atherectomy with the diagnosis of CAD/unstable angina.  The various methods of treatment have been discussed with the patient and family. After consideration of risks, benefits and other options for treatment, the patient has consented to  Procedure(s): Coronary Stent Intervention (N/A) as a surgical intervention .  The patient's history has been reviewed, patient examined, no change in status, stable for surgery.  I have reviewed the patient's chart and labs.  Questions were answered to the patient's satisfaction.  Cath Lab Visit (complete for each Cath Lab visit)  Clinical Evaluation Leading to the Procedure:   ACS: No.  Non-ACS:    Anginal Classification: CCS III  Anti-ischemic medical therapy: Maximal Therapy (2 or more classes of medications)  Non-Invasive Test Results: No non-invasive testing performed  Prior CABG: No previous CABG           Lorri Fukuhara

## 2014-10-19 NOTE — Progress Notes (Signed)
Patient had a vasovagal episode with systolic blood pressure temporarily dipped down to the 50s to 60s. He was given IV bolus and atropine with improvement in the blood pressure up to 90s. Throughout the entire event, patient was alert, oriented, and making conversation. Surprisingly he was completely asymptomatic during the entire process. He denies any dizziness or feeling of passing out.  He denies any groin pain, back pain, or abdominal pain to suggest any complications from cath or internal bleeding. We'll continue to observe blood pressure.  Hilbert Corrigan PA Pager: [phone]

## 2014-10-19 NOTE — H&P (Signed)
Chief Complaint  Patient presents with  . Shortness of Breath    History of Present Illness: 74 yo male with history of aortic stenosis, severe COPD, peripheral vascular disease, hyperlipidemia, HTN and anemia with prior GI bleeding who is referred today for consultation regarding his aortic valve disease. He has been followed closely over the past year by Dr. Candee Furbish with known aortic valve stenosis. He has recently experienced dyspnea with exertion following left knee arthroscopy in December 2015. Follow up echocardiogram January 2016 with severe aortic stenosis with mean gradient of 34 mm Hg and peak gradient of 60 mm Hg. Aortic valve area was estimated at 0.8 cm2. LV function was normal with LVEF=55%. He was hospitalized in late January 2016 with hypoxic respiratory failure felt to be secondary to COPD exacerbation and was treated with bronchodilators, antibiotics and steroids. He was seen for hospital follow up Dr. Marlou Porch and was referred for pulmonary evaluation with Dr. [name] who felt that a component of his respiratory issues was likely due to congestive heart failure. He was then seen in the CT surgery office by Dr. Roxy Manns 06/21/14 for further evaluation of his aortic stenosis. He has since undergone a right and left heart catheterization per Dr. Marlou Porch on June 28, 2014 and was found to have heavily calcified coronary arteries with severe disease in the mid LAD, mid RCA and moderately severe disease in the ostium of the Circumflex. Prior imaging confirms presence of severe calcification of the thoracic aorta. He has known PAD with occluded right iliac artery and moderate left iliac artery stenosis with prior stent placement. He has history of GI bleeding and was found to have angiodysplasia 7 years ago. He has been followed by GI and takes iron daily but no recent GI bleeding on ASA therapy. He smoked for many years and quit recently. He has stable COPD and does not require supplemental O2  therapy. I met him in April 2016 and we discussed cardiac cath with PCI and TAVR. In preparation for TAVR, I had planned PCI of the RCA with rotablator atherectomy in May 2016. He was started on Plavix and unfortunately developed GI bleeding. 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 Plavix was stopped and the AVMs were treated by GI. He has had no recurrent bleeding.   He is here today for cardiac with PCI/rotablator atherectomy of the mid RCA stenosis.    Primary Care Physician: Etter Sjogren  Past Medical History  Diagnosis Date  . Polyp of nasal cavity   . Rosacea   . Benign neoplasm of colon   . Tobacco abuse   . Psychosexual dysfunction with inhibited sexual excitement   . COPD   . Hypertension   . Hyperlipidemia   . GERD (gastroesophageal reflux disease)   . Anemia   . Arthritis   . GI bleed 2010    4 units PRBCs  . Adenomatous colon polyp   . Peripheral vascular disease   . Irregular heartbeat   . Shingles   . Carotid artery occlusion   . Angiodysplasia of intestine with hemorrhage   . Pleural plaque with presence of asbestos 03/27/2013    Followed in Pulmonary clinic/ [facility]/ [place] - F/u CT 09/08/2013 1. Stable extensive calcified pleural plaque formation consistent with asbestos exposure. 2. Multiple pulmonary nodules are unchanged from the CT of 6 months ago. Given risk factors for lung cancer, continued follow up is recommended with chest CT in 6 months> done 04/20/14 no change >repeat in 12  m in tickle file   . COPD GOLD III with min reversibilty  08/11/2006    Followed in Pulmonary clinic/ [facility]/ [place] - PFT's 04/28/2013 FEV1 0.88 (40%) with ratio 44 and 14% better p B2 dlco 45 corrects to 83 - Trial of breo 04/28/2013 > improved symptoms 06/09/2013 - spirometry 06/04/2014 FEV1 0.76 (29%) ratio 45   . GERD 11/30/2008    Qualifier: Diagnosis of By: Marijean Niemann CMA,  Danielle   . Iron deficiency anemia 01/25/2009    Qualifier: Diagnosis of By: Henrene Pastor MD, Docia Chuck   . PVD (peripheral vascular disease) 10/18/2012  . Carotid artery stenosis 04/22/2012    Past Surgical History  Procedure Laterality Date  . Iliac artery stent Left 2005    CIA  . Esophagogastroduodenoscopy  2012    normal  . Colonoscopy  July 2015    Dr. Henrene Pastor  . Tonsillectomy    . Cardiac catheterization  2001  . Knee arthroscopy with medial menisectomy Left 03/08/2014    Procedure: LEFT KNEE SCOPE WITH MEDIAL MENISECTOMY AND CHONDROPLASTY; Surgeon: Ninetta Lights, MD; Location: [place]; Service: Orthopedics; Laterality: Left;  . Left and right heart catheterization with coronary angiogram N/A 06/28/2014    Procedure: LEFT AND RIGHT HEART CATHETERIZATION WITH CORONARY ANGIOGRAM; Surgeon: Jerline Pain, MD; Location: [facility] CATH LAB; Service: Cardiovascular; Laterality: N/A;  . Esophagogastroduodenoscopy N/A 08/17/2014    Procedure: ESOPHAGOGASTRODUODENOSCOPY (EGD); Surgeon: Jerene Bears, MD; Location: [facility] ENDOSCOPY; Service: Endoscopy; Laterality: N/A;  . Colonoscopy N/A 08/17/2014    Procedure: COLONOSCOPY; Surgeon: Jerene Bears, MD; Location: [facility] ENDOSCOPY; Service: Endoscopy; Laterality: N/A;    Current Outpatient Prescriptions  Medication Sig Dispense Refill  . amLODipine (NORVASC) 10 MG tablet Take 10 mg by mouth daily.    . Ascorbic Acid (VITAMIN C) 250 MG tablet Take 500 mg by mouth daily.     Marland Kitchen atorvastatin (LIPITOR) 40 MG tablet Take 1 tablet (40 mg total) by mouth daily. 90 tablet 3  . BREO ELLIPTA 100-25 MCG/INH AEPB INHALE ONE PUFF INTO LUNGS ONCE DAILY IN THE MORNING 60 each 2  . docusate sodium (COLACE) 100 MG capsule Take 100 mg by mouth 2 (two) times daily as needed (constipation).     . fenofibrate 160 MG tablet Take 160 mg by mouth daily.    . Ferrous  Sulfate (IRON) 325 (65 FE) MG TABS Take 1 tablet by mouth 2 (two) times daily. 90 each 3  . Ipratropium-Albuterol (COMBIVENT RESPIMAT) 20-100 MCG/ACT AERS respimat Inhale 1 puff into the lungs 4 (four) times daily as needed for wheezing. 1 Inhaler 3  . metoprolol tartrate (LOPRESSOR) 25 MG tablet Take 0.5 tablets (12.5 mg total) by mouth 2 (two) times daily. 60 tablet 6  . Multiple Vitamins-Minerals (CENTRUM SILVER PO) Take 1 tablet by mouth daily.     . pantoprazole (PROTONIX) 40 MG tablet TAKE ONE TABLET BY MOUTH ONCE DAILY 90 tablet 3  . valsartan-hydrochlorothiazide (DIOVAN HCT) 160-12.5 MG per tablet Take 1 tablet by mouth daily. 90 tablet 1  . VOLTAREN 1 % GEL Apply 2 g topically 4 (four) times daily as needed (for arthritis).      No current facility-administered medications for this visit.    No Known Allergies  History   Social History  . Marital Status: Married    Spouse Name: N/A  . Number of Children: 4  . Years of Education: N/A   Occupational History  . Charity fundraiser    Social History Main Topics  .  Smoking status: Former Smoker -- 0.75 packs/day for 55 years    Types: Cigarettes, E-cigarettes    Quit date: 04/17/2014  . Smokeless tobacco: Never Used     Comment: pt states he knows he needs to quit. Also uses e-cig.  Marland Kitchen Alcohol Use: 2.4 oz/week    4 Glasses of wine per week     Comment: occasional  . Drug Use: No  . Sexual Activity:    Partners: Female   Other Topics Concern  . Not on file   Social History Narrative    Family History  Problem Relation Age of Onset  . Lung disease Mother     pulm fibrosis  . Uterine cancer    . Colitis Father   . Colon cancer Neg Hx   . Heart disease Brother   . Hypertension Brother   . Hyperlipidemia Brother   . Heart disease Daughter     cad  .  Hypertension Son     Review of Systems: As stated in the HPI and otherwise negative.   BP 146/72 mmHg  Pulse 65  Ht 5' 3" (1.6 m)  Wt 149 lb 1.9 oz (67.64 kg)  BMI 26.42 kg/m2  Physical Examination: General: Well developed, well nourished, NAD  HEENT: OP clear, mucus membranes moist  SKIN: warm, dry. No rashes. Neuro: No focal deficits  Musculoskeletal: Muscle strength 5/5 all ext  Psychiatric: Mood and affect normal  Neck: No JVD, no carotid bruits, no thyromegaly, no lymphadenopathy.  Lungs:Clear bilaterally, no wheezes, rhonci, crackles Cardiovascular: Regular rate and rhythm. Harsh systolic murmur. No gallops or rubs. Abdomen:Soft. Bowel sounds present. Non-tender.  Extremities: No lower extremity edema. Left femoral artery pulse is 1+. No right femoral artery pulse.   Cardiac cath 06/28/14: HEMODYNAMICS:  Right atrium (RA): 12/10/8 mmHg Right ventricle (RV): 41/3/10 mmHg Pulmonary artery (PA): 39/17/26 mmHg Pulmonary capillary wedge pressure (PCWP): 17/14/14 mmHg Cardiac output: Fick 4.87 liters/min Cardiac index: Fick 2.99 PA saturation: 67% FA saturation: 96% Aortic pressure: 148/47 mmHg LV pressure: 098 mmHg systolic; LVEDP 17 mmHg.  Peak to peak gradient between the left ventricle and aorta was 77mHg, mean 41.257mg, Calculated valve area 0.67cm squared.  ANGIOGRAPHIC DATA:  Left main: Calcified vessel branching into circumflex as well as LAD, no significant stenosis. Left anterior descending (LAD): Densely calcified vessel with severe proximal/mid LAD stenosis surrounding major septal branch of approximately 90% at its highest, continuing to large diagonal branch. Disease length is approximately 15-20 mm. Major diagonal branch also has 70% proximal stenosis. Circumflex artery (CIRC): In the RAO caudal view there appears to be ostial/proximal circumflex disease, perhaps 90% stenosis. This vessel is also densely calcified and is fairly small in caliber  distally. There are 2 small obtuse marginal branches. Right coronary artery (RCA): This vessel is also densely calcified. There is a significant 90% stenosis in the mid RCA. There is also 50% stenosis in the proximal RCA at the first major bend of the vessel. This vessel is dominant giving rise to the posterior descending artery. It is fairly large in caliber. LEFT VENTRICULOGRAM: Left ventricular angiogram was done in the 30 RAO projection with hand injection and revealed normal left ventricular wall motion and systolic function with an estimated ejection fraction of 65%.   Echo 04/06/14: - Left ventricle: The cavity size was is borderline dilated. Wall thickness was normal. Systolic function was normal. The estimated ejection fraction was in the range of 55% to 60%. Wall motion was normal; there were no regional wall  motion abnormalities. Doppler parameters are consistent with abnormal left ventricular relaxation (grade 1 diastolic dysfunction). The E/e&' ratio is between 8-15, suggesting indeterminate LV filling pressure. - Aortic valve: Heavily calcified aortic valve leaflets. Peak and mean gradients of 60 and 34 mmHg, respectively. Based on an LVOT diameter of 2.0 cm, the calculated AVA is 0.8 cm2, consistent with severe aortic stenosis. There is mild regurgitation. Valve area (VTI): 0.74 cm^2. Valve area (Vmean): 0.68 cm^2. - Mitral valve: Calcified annulus. Mildly thickened leaflets . There was trivial regurgitation. - Left atrium: Severely dilated at 49 ml/m2.  Impressions:  - LVEF 55-60%, normal wall thickness, borderline dilated LV, normal wall motion, diastolic dysfunction, likely elevated LV filling pressure, there is severe aortic stenosis, peak and mean gradients of 60 mmHG and 35 mmHg, respectively. AVA is around 0.8 cm2. Mild AI, severely dilated LA  CTA chest/abd/pelvis 07/09/14:  Mediastinum/Lymph Nodes: Heart size is borderline  enlarged. There is no significant pericardial fluid, thickening or pericardial calcification. There is atherosclerosis of the thoracic aorta, the great vessels of the mediastinum and the coronary arteries, including calcified atherosclerotic plaque in the left main, left anterior descending, left circumflex and right coronary arteries. In addition, there is high-grade stenosis at the origin of the left subclavian artery, and in the proximal right subclavian artery. Severe thickening calcifications of the aortic valve. No pathologically enlarged mediastinal or hilar lymph nodes. Esophagus is unremarkable in appearance. No axillary lymphadenopathy.  Lungs/Pleura: Extensive calcified pleural plaques are noted throughout the thorax bilaterally, indicative of asbestos related pleural disease. Multiple pulmonary nodules are noted in the lungs bilaterally, largest which measures 8 x 4 mm in the apex of the right upper lobe (image 7 of series 403). These nodules are generally similar in size, number and distribution to remote prior study 03/10/2013, and are considered likely benign. No acute consolidative airspace disease. No pleural effusions. No definite fibrotic changes.  Musculoskeletal/Soft Tissues: There are no aggressive appearing lytic or blastic lesions noted in the visualized portions of the skeleton.  CTA ABDOMEN AND PELVIS FINDINGS  Hepatobiliary: 8 mm low attenuation lesion in the medial aspect of segment 6 of the liver is too small to characterize, but is statistically likely to represent a small cyst. No other suspicious appearing hepatic lesions are noted. No intra or extrahepatic biliary ductal dilatation. Tiny calcified gallstone lying dependently in the gallbladder. No current findings to suggest an acute cholecystitis at this time.  Pancreas: Unremarkable.  Spleen: Unremarkable.  Adrenals/Urinary Tract: Sub cm low-attenuation lesion in the upper pole of the  right kidney is too small to definitively characterize, but is favored to represent a tiny cyst. Left kidney is normal in appearance. Bilateral adrenal glands are normal in appearance. No hydroureteronephrosis. Urinary bladder is unremarkable.  Stomach/Bowel: The appearance of the stomach is normal. No pathologic dilatation of small bowel or colon. A few scattered colonic diverticulae are noted, without surrounding inflammatory changes to suggest an acute diverticulitis at this time. Normal appendix.  Vascular/Lymphatic: Vascular findings and measurements pertinent to potential TAVR procedure, as detailed below. Severe atherosclerosis. 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 Probable stent in the left common iliac artery distally.  Reproductive: Prostate gland and seminal vesicles are unremarkable in appearance. Bilateral vasectomy clips.  Other: No significant volume of ascites. No pneumoperitoneum.  Musculoskeletal: Small well-defined sclerotic lesion in the  left ilium adjacent to the sacroiliac joint, favored to represent a tiny bone island. There are no aggressive appearing lytic or blastic lesions noted in the visualized portions of the skeleton.  VASCULAR MEASUREMENTS PERTINENT TO TAVR:  AORTA:  Minimal Aortic Diameter - 10 x 9 mm  Severity of Aortic Calcification - severe  RIGHT PELVIS:  Right Common Iliac Artery -  Minimal Diameter - 6.8 x 3.6 mm  Tortuosity - mild  Calcification - severe  Right External Iliac Artery -  Minimal Diameter - 3.5 x 2.7 mm  Tortuosity - moderate  Calcification - severe  Right Common Femoral Artery -  Minimal Diameter - 5.3 x 3.2 mm  Tortuosity - mild  Calcification - severe  LEFT PELVIS:  Left Common Iliac Artery  -  Minimal Diameter - 3.9 x 3.7 mm  Tortuosity - mild  Calcification - severe  Left External Iliac Artery -  Minimal Diameter - 4.5 x 3.1 mm  Tortuosity - moderate  Calcification - severe  Left Common Femoral Artery -  Minimal Diameter - 2.8 x 2.1 mm  Tortuosity - mild  Calcification - severe  Review of the MIP images confirms the above findings.  IMPRESSION: 1. Severe atherosclerosis, particularly throughout the pelvic arterial access vessels, as detailed above. This patient does not have suitable pelvic arterial access for TAVR procedure. 2. 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 3. Asbestos related pleural disease. No fibrotic changes in the lung parenchyma to suggest asbestosis at this time. 4. Multiple small pulmonary nodules scattered throughout the lungs bilaterally, similar in size, number and distribution to prior examinations dating back to 03/10/2013, strongly favored to be benign. Attention on one additional noncontrast chest CT in 12 months is suggested to confirm greater than 24 months of stability. 5. Cholelithiasis without evidence of acute cholecystitis at this time. 6. Mild colonic diverticulosis without evidence of acute diverticulitis at this time. 7. Additional incidental findings, as above.  Cardiac CT 07/09/14: Aortic Valve: Trileaflet and heavily calcified. Mild annular calcification near base of left cusp. No significant MAC.  Aorta: Porcelin aortic root with severe calcification through out the arch and descending thoracic aorta as well. Bilateral proximal subclavian artery stenosis  Sinotubular Junction: 2.2 cm  Ascending Thoracic Aorta: 2.8 cm  Descending Thoracic Aorta: 2.2 cm  Sinus of Valsalva Measurements:  Non-coronary: 29 mm  Right -coronary: 26 mm  Left -coronary: 28.5 mm  Coronary Artery Height above Annulus:  Left Main:  15.4 mm  Right Coronary: 14.8 mm  Virtual Basal Annulus Measurements:  Maximum/Minimum Diameter: 26.7 mm x 22.4 mm  Perimeter: 77 mm  Area: 465 mm2  Coronary Arteries: Sufficient height above annulus for delivery heavily calcified right dominant  Optimum Fluoroscopic Angle for Delivery: LAO 4 degress Cranial 1 degree  IMPRESSION: 1) Aortic annulus area of 465 mm2 suitable for 26 mm Sapien 3 valve  2) 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0 - 17.0 g/dL 15.4 10.0(L) 9.4(L)  Hematocrit 39.0 - 52.0 % 46.2 30.4(L) 28.9(L)  Platelets 150.0 - 400.0 K/uL 278.0 291 336     Lipid Panel  Labs (Brief)       Component Value Date/Time   CHOL 154 05/10/2014 1124   TRIG 125.0 05/10/2014 1124   TRIG 102 02/26/2006 1044   HDL 55.40 05/10/2014 1124   CHOLHDL 3 05/10/2014 1124   CHOLHDL 2.8 CALC 02/26/2006 1044   VLDL 25.0 05/10/2014 1124   LDLCALC 74 05/10/2014 1124      Wt Readings from Last 3 Encounters:  09/13/14 149 lb 1.9 oz (67.64 kg)  08/16/14 143 lb 15.4 oz (65.3 kg)  08/15/14 147 lb 4 oz (66.792 kg)     Assessment and Plan:   1. Severe mid RCA stenosis:  He has unstable angina. Plans today for rotablator atherectomy of the mid RCA stenosis with stenting. He was loaded with Plavix 600 mg this am.  Will use bare metal stent. Will plan TAVR in the next month.         MCALHANY,CHRISTOPHER 10/19/2014 9:08 AM

## 2014-10-19 NOTE — Progress Notes (Signed)
Site area: right groin  Site Prior to Removal:  Level 0  Pressure Applied For 30 MINUTES    Minutes Beginning at 13:00  Manual:   Yes.    Patient Status During Pull:  Pt vagal 5 mins after pull; no dizziness, sweaty, BP=78/29; HR=72  Post Pull Groin Site:  Level 0  Post Pull Instructions Given:  Yes.    Post Pull Pulses Present:  Yes.    Dressing Applied:  Yes.    Comments:  PA Hao @ pt's bedside when pt vasovagal. Pt is sweaty, BP=78/29; HR=72; No nausea; No dizziness. NS 300 ml bolus given, #1 amp Atropine IV given. Currenly pt BP=116/40; HR=93.

## 2014-10-20 DIAGNOSIS — I10 Essential (primary) hypertension: Secondary | ICD-10-CM | POA: Diagnosis not present

## 2014-10-20 DIAGNOSIS — I2584 Coronary atherosclerosis due to calcified coronary lesion: Secondary | ICD-10-CM | POA: Diagnosis not present

## 2014-10-20 DIAGNOSIS — E785 Hyperlipidemia, unspecified: Secondary | ICD-10-CM | POA: Diagnosis not present

## 2014-10-20 DIAGNOSIS — I25110 Atherosclerotic heart disease of native coronary artery with unstable angina pectoris: Secondary | ICD-10-CM | POA: Diagnosis not present

## 2014-10-20 DIAGNOSIS — I200 Unstable angina: Secondary | ICD-10-CM | POA: Diagnosis not present

## 2014-10-20 DIAGNOSIS — J449 Chronic obstructive pulmonary disease, unspecified: Secondary | ICD-10-CM | POA: Diagnosis not present

## 2014-10-20 DIAGNOSIS — I739 Peripheral vascular disease, unspecified: Secondary | ICD-10-CM | POA: Diagnosis not present

## 2014-10-20 LAB — BASIC METABOLIC PANEL
ANION GAP: 7 (ref 5–15)
BUN: 15 mg/dL (ref 6–20)
CHLORIDE: 105 mmol/L (ref 101–111)
CO2: 23 mmol/L (ref 22–32)
Calcium: 8.9 mg/dL (ref 8.9–10.3)
Creatinine, Ser: 1.12 mg/dL (ref 0.61–1.24)
GLUCOSE: 128 mg/dL — AB (ref 65–99)
POTASSIUM: 4.2 mmol/L (ref 3.5–5.1)
SODIUM: 135 mmol/L (ref 135–145)

## 2014-10-20 LAB — CBC
HEMATOCRIT: 39 % (ref 39.0–52.0)
HEMOGLOBIN: 13 g/dL (ref 13.0–17.0)
MCH: 33.1 pg (ref 26.0–34.0)
MCHC: 33.3 g/dL (ref 30.0–36.0)
MCV: 99.2 fL (ref 78.0–100.0)
Platelets: 228 10*3/uL (ref 150–400)
RBC: 3.93 MIL/uL — AB (ref 4.22–5.81)
RDW: 13.5 % (ref 11.5–15.5)
WBC: 8.3 10*3/uL (ref 4.0–10.5)

## 2014-10-20 MED ORDER — CLOPIDOGREL BISULFATE 75 MG PO TABS: 75.0000 mg | ORAL_TABLET | Freq: Every day | ORAL | Status: DC

## 2014-10-20 MED ORDER — ASPIRIN 81 MG PO CHEW: 81.0000 mg | CHEWABLE_TABLET | Freq: Every day | ORAL | Status: DC

## 2014-10-20 NOTE — Progress Notes (Addendum)
CARDIAC REHAB PHASE I   PRE:  Rate/Rhythm: 86 sinus rhythm, NSST change  BP:  Supine:   Sitting: 101/35  Standing:    SaO2: 90% ra   MODE:  Ambulation: 340 ft   POST:  Rate/Rhythem: 90 sinus rhythm  BP:  Supine:   Sitting: 154/53  Standing:    SaO2: 86% ra up to 92% with rest and PLB  Pt ambulated in hallway x 1 assist slow steady gait. Pt c/o fatigue and dyspnea.  Pt desaturated to 86% with ambulation.  Tarri Fuller, PA made aware.  Order given for home oxygen.  Pt education about home oxygen use given. Pt ambulated in hallway with oxygen, O2 sat-94% 2L via [place] at rest, 90% 2L via [place] with ambulation.  Pt cautioned against heavy exertion at home including push mowing his lawn.   Education completed including risk factor modification, low fat-low cholesterol diet, exercise, and medication compliance.  Pt oriented to outpatient cardiac rehab.  At pt request, referral will be sent to [facility].  Pt is anticipating his wife needing surgery and TAVR for himself, therefore enrollment may be postponed. Understanding verbalized   Carolyne Littles  [id number]

## 2014-10-20 NOTE — Discharge Summary (Signed)
Physician Discharge Summary     Cardiologist: Skains/McAlhany  Patient ID: Ronald Bell MRN: [id number] DOB/AGE: Aug 22, 1940 74 y.o.  Admit date: 10/19/2014 Discharge date: 10/20/2014  Admission Diagnoses:  Unstable angina  Discharge Diagnoses:  Active Problems:   Unstable angina   Severe aortic stenosis   Bradycardia    Coronary artery disease   History of GI bleed, intestinal AVMs  Discharged Condition: stable  Hospital Course:   74 yo male with history of aortic stenosis, severe COPD, peripheral vascular disease, hyperlipidemia, HTN and anemia with prior GI bleeding who was referred for consultation regarding his aortic valve disease. He has been followed closely over the past year by Dr. Candee Furbish with known aortic valve stenosis. He has recently experienced dyspnea with exertion following left knee arthroscopy in December 2015. Follow up echocardiogram January 2016 with severe aortic stenosis with mean gradient of 34 mm Hg and peak gradient of 60 mm Hg. Aortic valve area was estimated at 0.8 cm2. LV function was normal with LVEF=55%. He was hospitalized in late January 2016 with hypoxic respiratory failure felt to be secondary to COPD exacerbation and was treated with bronchodilators, antibiotics and steroids. He was seen for hospital follow up Dr. Marlou Porch and was referred for pulmonary evaluation with Dr. [name] who felt that a component of his respiratory issues was likely due to congestive heart failure. He was then seen in the CT surgery office by Dr. Roxy Manns 06/21/14 for further evaluation of his aortic stenosis. He has since undergone a right and left heart catheterization per Dr. Marlou Porch on June 28, 2014 and was found to have heavily calcified coronary arteries with severe disease in the mid LAD, mid RCA and moderately severe disease in the ostium of the Circumflex. Prior imaging confirms presence of severe calcification of the thoracic aorta. He has known PAD with occluded right  iliac artery and moderate left iliac artery stenosis with prior stent placement. He has history of GI bleeding and was found to have angiodysplasia 7 years ago. He has been followed by GI and takes iron daily but no recent GI bleeding on ASA therapy. He smoked for many years and quit recently. He has stable COPD and does not require supplemental O2 therapy.  Dr. Angelena Form met him in April 2016 and we discussed cardiac cath with PCI and TAVR. In preparation for TAVR, He had planned PCI of the RCA with rotablator atherectomy in May 2016. He was started on Plavix and unfortunately developed GI bleeding. 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 Plavix was stopped and the AVMs were treated by GI. He has had no recurrent bleeding.   He presented for cardiac with PCI/rotablator atherectomy of the mid RCA stenosis.  the procedure was completed successfully with placement of a bare-metal stent.  Was restarted on aspirin and Plavix. He is also on statin and beta blocker.  He did have a vasovagal episode with systolic blood pressure temporarily dipped down to the 50s to 60s. He was given IV bolus and atropine with improvement in the blood pressure up to 90s. Throughout the entire event, patient was alert, oriented, and making conversation. Surprisingly he was completely asymptomatic during the entire process. He denies any dizziness or feeling of passing out.  We rechecked his CBC which was still within normal limits.  Patient name [name] People with cardiac rehabilitation the morning of discharge and ambulatory O2 saturations were 86-87%.  He'll be sent home on home oxygen although the patient is hesitant and likely will  not use it.  The patient was seen by Dr. Harl Bowie who felt he was stable for DC home.   Consults: None  Significant Diagnostic Studies:   Left heart catheterization Conclusion     Prox RCA lesion, 40% stenosed.  Mid RCA lesion, 95% stenosed. There is a 0% residual stenosis post  intervention.  A bare metal stent was placed.  1. Severe mid RCA stenosis now s/p successful rotablator atherectomy of the mid RCA with bare metal stent placement.    Recommendations: Will continue ASA and Plavix for one month. Probable d/c in am. He has completed his workup for TAVR. Will need to have f/u in our valve clinic in 1-2 weeks and will then plan a date for TAVR. Hopefully he will not have further GI bleeding on Plavix. (I waited to load until today).      Coronary Findings    Dominance: Right   Right Coronary Artery   . Prox RCA lesion, 40% stenosed. calcified discrete .   Marland Kitchen Mid RCA lesion, 95% stenosed. calcified discrete .   Marland Kitchen PCI: The pre-interventional distal flow is normal (TIMI 3). Pre-stent angioplasty was performed. A bare metal stent was placed. The strut is apposed. Post-stent angioplasty was performed. The post-interventional distal flow is normal (TIMI 3). The intervention was successful. No complications occurred at this lesion. Rotational atherectomy used to pre-treat lesion.  . There is no residual stenosis post intervention.        Coronary Diagrams    Diagnostic Diagram           Post-Intervention Diagram          Treatments: see above  Discharge Exam: Blood pressure 119/31, pulse 83, temperature 97.5 F (36.4 C), temperature source Oral, resp. rate 20, height 5' 3.5" (1.613 m), weight 149 lb 7.6 oz (67.8 kg), SpO2 94 %.   Disposition: 01-Home or Self Care      Discharge Instructions    Amb Referral to Cardiac Rehabilitation    Complete by:  As directed   Congestive Heart Failure: If diagnosis is Heart Failure, patient MUST meet each of the CMS criteria: 1. Left Ventricular Ejection Fraction </= 35% 2. NYHA class II-IV symptoms despite being on optimal heart failure therapy for at least 6 weeks. 3. Stable = have not had a recent (<6 weeks) or planned (<6 months) major cardiovascular hospitalization or procedure  Program  Details: - Physician supervised classes - 1-3 classes per week over a 12-18 week period, generally for a total of 36 sessions  Physician Certification: I certify that the above Cardiac Rehabilitation treatment is medically necessary and is medically approved by me for treatment of this patient. The patient is willing and cooperative, able to ambulate and medically stable to participate in exercise rehabilitation. The participant's progress and Individualized Treatment Plan will be reviewed by the Medical Director, Cardiac Rehab staff and as indicated by the Referring/Ordering Physician.  Diagnosis:  PCI     Diet - low sodium heart healthy    Complete by:  As directed      Discharge instructions    Complete by:  As directed   No lifting more than a half gallon of milk or driving for three days.     Increase activity slowly    Complete by:  As directed             Medication List    TAKE these medications        amLODipine 10 MG tablet  Commonly  known as:  NORVASC  Take 1 tablet (10 mg total) by mouth daily.     aspirin 81 MG chewable tablet  Chew 1 tablet (81 mg total) by mouth daily.     atorvastatin 40 MG tablet  Commonly known as:  LIPITOR  TAKE ONE TABLET BY MOUTH ONCE DAILY     BREO ELLIPTA 100-25 MCG/INH Aepb  Generic drug:  Fluticasone Furoate-Vilanterol  INHALE ONE PUFF INTO LUNGS ONCE DAILY IN THE MORNING     CENTRUM SILVER PO  Take 1 tablet by mouth daily.     clopidogrel 75 MG tablet  Commonly known as:  PLAVIX  Take 1 tablet (75 mg total) by mouth daily with breakfast.     docusate sodium 100 MG capsule  Commonly known as:  COLACE  Take 100 mg by mouth 2 (two) times daily as needed (constipation).     fenofibrate 160 MG tablet  Take 160 mg by mouth daily.     Ipratropium-Albuterol 20-100 MCG/ACT Aers respimat  Commonly known as:  COMBIVENT RESPIMAT  Inhale 1 puff into the lungs 4 (four) times daily as needed for wheezing.     Iron 325 (65 FE) MG Tabs   Take 1 tablet by mouth 2 (two) times daily.     metoprolol tartrate 25 MG tablet  Commonly known as:  LOPRESSOR  Take 0.5 tablets (12.5 mg total) by mouth 2 (two) times daily.     pantoprazole 40 MG tablet  Commonly known as:  PROTONIX  TAKE ONE TABLET BY MOUTH ONCE DAILY     valsartan-hydrochlorothiazide 160-12.5 MG per tablet  Commonly known as:  DIOVAN HCT  Take 1 tablet by mouth daily.     vitamin C 250 MG tablet  Commonly known as:  ASCORBIC ACID  Take 500 mg by mouth daily.     VOLTAREN 1 % Gel  Generic drug:  diclofenac sodium  Apply 2 g topically 4 (four) times daily as needed (for arthritis).       Follow-up Information    Follow up with [facility].   Why:  home oxygen   Contact information:   [address] [place] [address] [phone]      Greater than 30 minutes was spent completing the patient's discharge.     SignedTarri Fuller, PAC 10/20/2014, 10:15 AM

## 2014-10-20 NOTE — Care Management Note (Signed)
Case Management Note  Patient Details  Name: Ronald Bell MRN: [id number] Date of Birth: Jun 20, 1940  Subjective/Objective:                   Shortness of Breath Action/Plan:  CM received call from RN requesting home O2 for pt.  CM requested pulmonary saturation note.  Order placed.  CM called [facility] DME rep, Jeneen Rinks to please deliver the O2 to room once pulmonary saturation note has been placed for pt discharge.  No other CM needs were communicated. Expected Discharge Date:                  Expected Discharge Plan:  [place]  In-House Referral:     Discharge planning Services  CM Consult  Post Acute Care Choice:  NA Choice offered to:  NA  DME Arranged:  Oxygen DME Agency:  [place]:    [place] Agency:     Status of Service:  Completed, signed off  Medicare Important Message Given:    Date Medicare IM Given:    Medicare IM give by:    Date Additional Medicare IM Given:    Additional Medicare Important Message give by:     If discussed at [place] of Stay Meetings, dates discussed:    Additional Comments:  Dellie Catholic, RN 10/20/2014, 8:57 AM

## 2014-10-20 NOTE — Progress Notes (Signed)
Patient ID: Ronald Bell, male   DOB: Aug 19, 1940, 74 y.o.   MRN: [id number]     Subjective:   No complaints   Objective:   Temp:  [97.5 F (36.4 C)-98.6 F (37 C)] 97.5 F (36.4 C) (07/30 0735) Pulse Rate:  [66-97] 83 (07/30 0735) Resp:  [10-106] 20 (07/30 0735) BP: (52-144)/(17-99) 119/31 mmHg (07/30 0735) SpO2:  [40 %-98 %] 94 % (07/30 0735) Weight:  [149 lb 7.6 oz (67.8 kg)] 149 lb 7.6 oz (67.8 kg) (07/30 0021) Last BM Date: 10/19/14  Filed Weights   10/19/14 0746 10/20/14 0021  Weight: 140 lb (63.504 kg) 149 lb 7.6 oz (67.8 kg)    Intake/Output Summary (Last 24 hours) at 10/20/14 0802 Last data filed at 10/20/14 0626  Gross per 24 hour  Intake    940 ml  Output   1525 ml  Net   -585 ml    Telemetry: NSR  Exam:  General: NAD  Resp: CTAB  Cardiac: RRR, 3/6 systolic murmur RUSB, no JVD  GI: abdomen soft, NT, ND  MSK: no LE edema  Neuro: no focal deficits  Psych: appropriate affect  Lab Results:  Basic Metabolic Panel:  Recent Labs Lab 10/15/14 0911 10/20/14 0250  NA 135 135  K 4.0 4.2  CL 102 105  CO2 25 23  GLUCOSE 111* 128*  BUN 17 15  CREATININE 1.00 1.12  CALCIUM 10.0 8.9    Liver Function Tests: No results for input(s): AST, ALT, ALKPHOS, BILITOT, PROT, ALBUMIN in the last 168 hours.  CBC:  Recent Labs Lab 10/15/14 0911  WBC 9.2  HGB 15.4  HCT 46.2  MCV 98.9  PLT 278.0    Cardiac Enzymes: No results for input(s): CKTOTAL, CKMB, CKMBINDEX, TROPONINI in the last 168 hours.  BNP: No results for input(s): PROBNP in the last 8760 hours.  Coagulation:  Recent Labs Lab 10/15/14 0911  INR 1.2*    ECG:   Medications:   Scheduled Medications: . amLODipine  10 mg Oral Daily  . aspirin  81 mg Oral Daily  . atorvastatin  40 mg Oral Daily  . clopidogrel  75 mg Oral Q breakfast  . fenofibrate  160 mg Oral Daily  . ferrous sulfate  325 mg Oral BID WC  . irbesartan  150 mg Oral Daily   And  . hydrochlorothiazide   12.5 mg Oral Daily  . metoprolol tartrate  12.5 mg Oral BID  . oxymetazoline  1 spray Each Nare BID  . pantoprazole  40 mg Oral Daily  . sodium chloride  3 mL Intravenous Q12H     Infusions:     PRN Medications:  sodium chloride, acetaminophen, ipratropium-albuterol, ondansetron (ZOFRAN) IV, sodium chloride     Assessment/Plan    1. Aortic stenosis - mod to severe by echo Jan 2016 mean grad 34, AVA 0..8.  - being considered for TAVR   2. Bradycardia - from records vasovagal event yesterday with heart rates to 50s and blood pressure to 90s, resolved with atropine and IVF.  - cbc is pending, reportedly normal systolics with low diastolic pressure. Heart rates 70-90s.     3. History of GI bleed - history of intestinal AVMs, no recent bleeding.   4. CAD - cath yesterday for planned PCI of RCA based on prior diagnostic cath. Received succesful rotoblator atherectomy and BMS to RCA - he is on ASA, atorva 40, plavix, irbesartan, metoprolol.    Plan for discharge once cbc is back. Needs f/u  in valve clinic 1-2 weeks per Dr Angelena Form.   Carlyle Dolly, M.D.

## 2014-10-20 NOTE — Progress Notes (Signed)
SATURATION QUALIFICATIONS: (This note is used to comply with regulatory documentation for home oxygen)  Patient Saturations on Room Air at Rest = 88%  Patient Saturations on Room Air while Ambulating = 86%  Patient Saturations on 2 Liters of oxygen while Ambulating = 92%  Please briefly explain why patient needs home oxygen: Required per above assessment

## 2014-10-22 ENCOUNTER — Telehealth: Payer: Self-pay | Admitting: Cardiology

## 2014-10-22 ENCOUNTER — Telehealth: Payer: Self-pay | Admitting: *Deleted

## 2014-10-22 MED FILL — Nitroglycerin IV Soln 200 MCG/ML in D5W: INTRAVENOUS | Qty: 250 | Status: AC

## 2014-10-22 NOTE — Telephone Encounter (Signed)
New Message   Pt called states that Tarri Fuller ordered him some oxygen. Pt states that he turned it in by mistake thinking he would get another unit and now he wants it back. Pt requests to have the order for oxygen resubmitted to [facility]. Please call back to discuss.  Pt requested to have this message routed to [facility]. Informed the pt that the message would be routed to his nurse for further assistance.

## 2014-10-22 NOTE — Telephone Encounter (Signed)
Pt was d/ced with 02 7/30.  Left message for him to c/b to discuss.

## 2014-10-22 NOTE — Telephone Encounter (Signed)
Transition Care Management Follow-up Telephone Call   Date discharged? 10/20/14   How have you been since you were released from the hospital? Patient states that he's doing good.   Do you understand why you were in the hospital? Yes, pt. stated that he had a stent placed in his heart.   Do you understand the discharge instructions? yes   Where were you discharged to? Home with wife   Items Reviewed:  Medications reviewed: yes  Allergies reviewed: yes  Dietary changes reviewed: yes, incorporate more fiber in diet  Referrals reviewed: yes, appointment 8/10 to discuss valve replacement   Functional Questionnaire:   Activities of Daily Living (ADLs):   He states they are independent in the following: ambulation, bathing and hygiene, feeding, continence, grooming, toileting and dressing States they require assistance with the following: None   Any transportation issues/concerns?: no   Any patient concerns? no   Confirmed importance and date/time of follow-up visits scheduled yes, 8/15 at 11:30 M  Provider Appointment booked with Dr. Etter Sjogren  Confirmed with patient if condition begins to worsen call PCP or go to the ER.  Patient was given the office number and encouraged to call back with question or concerns.  : yes

## 2014-10-23 NOTE — Telephone Encounter (Signed)
02 being delivered today per pt.  He denied any further needs.

## 2014-10-31 ENCOUNTER — Encounter: Payer: Medicare Other | Admitting: Surgery

## 2014-11-01 ENCOUNTER — Encounter: Payer: Self-pay | Admitting: Surgery

## 2014-11-01 ENCOUNTER — Institutional Professional Consult (permissible substitution) (INDEPENDENT_AMBULATORY_CARE_PROVIDER_SITE_OTHER): Payer: Medicare Other | Admitting: Surgery

## 2014-11-01 VITALS — BP 132/52 | HR 82 | Resp 20 | Ht 63.0 in | Wt 149.0 lb

## 2014-11-01 DIAGNOSIS — I350 Nonrheumatic aortic (valve) stenosis: Secondary | ICD-10-CM | POA: Diagnosis not present

## 2014-11-02 ENCOUNTER — Other Ambulatory Visit: Payer: Self-pay | Admitting: *Deleted

## 2014-11-02 ENCOUNTER — Encounter: Payer: Self-pay | Admitting: Surgery

## 2014-11-02 DIAGNOSIS — I350 Nonrheumatic aortic (valve) stenosis: Secondary | ICD-10-CM

## 2014-11-02 NOTE — Progress Notes (Signed)
Patient ID: THOMAS MABRY, male   DOB: 1940/07/07, 74 y.o.   MRN: [id number]  [facility] CONSULTATION REPORT  Referring Provider is Jerline Pain, MD PCP is [name], DO  Chief Complaint  Patient presents with  . Aortic Stenosis    2nd TVAR evel, scheduled surgery date     HPI:  Patient is a 74 year old white male with history of aortic stenosis, severe COPD, hypertension, peripheral vascular disease, hyperlipidemia, and iron deficient anemia with GI bleeding from angiodysplasia in the past who has been referred for consideration of TAVR. He had a known history of moderate AS by echo in 01/2012 with a mean gradient of 28 mm Hg. In December 2015 the patient underwent left knee arthroscopy for torn cartilage under general anesthesia and tolerated the procedure well but within a few weeks he began to experience progressive symptoms of shortness of breath. Transthoracic echocardiogram performed 04/06/2014 revealed the presence of severe aortic stenosis based upon peak velocity across the aortic valve that ranged between 3.5 and 3.8 m/s corresponding to peak-to-peak and mean transvalvular gradients estimated to be 60 and 34 mmHg, respectively. Aortic valve area was calculated to be 0.8 cm. Left ventricular systolic function remained preserved with an ejection fraction estimated 55%.Shortly after that the patient was hospitalized on 04/16/2014 with acute hypoxic respiratory failure. He was initially treated using BiPAP and he recovered quickly. He was diagnosed with a presumed flare of COPD. Symptoms continued to improve with bronchodilators, antibiotics, and steroids. He was seen in follow-up shortly after hospital discharge by Dr. Marlou Porch who was concerned that the patient's aortic stenosis might be contributing to his dyspnea.On 05/31/2014 the patient developed another acute episode of  shortness of  breath and dry non-productive cough for which he was briefly hospitalized. He was again treated for presumed flare of COPD and discharged from the hospital. The patient was seen in follow-up by Dr. [name] on 06/04/2014 who felt concerned that the patient's symptoms of progressive shortness of breath with orthopnea and PND sounded concerning for likely congestive heart failure.He underwent cath on 06/28/2014 that showed a 90% mid RCA stenosis and a 50% proximal RCA stenosis. There was also a 90% proximal/mid LAD stenosis. There was also a 90% ostial/proximal LCX stenosis. There was severe AS with a mean gradient of 41 mm Hg, peak to peak of 44 mm Hg and an AVA of 0.67 cm2. There was severe calcific vascular disease involving the entire aorta and great vessels. He has known PAD with occluded right iliac artery and moderate left iliac artery stenosis with prior stent placement. After discussion between Dr. Angelena Form and Dr. Roxy Manns it was decided that the patient would undergo PCI on the RCA with rotational atherectomy and bare metal stenting. Unfortunately on DAT he developed melena and a drop in his Hgb. He was evaluated by GI and found to have multiple angiodysplasias in the stomach, duodenum and jejunum, colon that were ablated. He was subsequently started on DAT again and underwent PCI of the mid RCA 95% lesion with a BMS on 10/19/2014. He says that he continues to have tiredness and exertional fatigue, shortness of breath with exertion. He has no chest pain or dizziness.  He is married and lives with his wife. She has been ill recently having undergone abdominal surgery after swallowing a toothpick.   Past Medical History  Diagnosis Date  . Polyp of nasal cavity   . Rosacea   .  Benign neoplasm of colon   . Tobacco abuse   . Psychosexual dysfunction with inhibited sexual excitement   . Hypertension   . Hyperlipidemia   . GERD (gastroesophageal reflux disease)   . GI bleed 2010    4 units PRBCs  .  Adenomatous colon polyp   . Peripheral vascular disease   . Irregular heartbeat   . Shingles   . Carotid artery occlusion   . Angiodysplasia of intestine with hemorrhage   . Pleural plaque with presence of asbestos 03/27/2013    Followed in Pulmonary clinic/ [facility]/ [place] - F/u CT 09/08/2013 1. Stable extensive calcified pleural plaque formation consistent with asbestos exposure. 2. Multiple pulmonary nodules are unchanged from the CT of 6 months ago. Given risk factors for lung cancer, continued follow up is recommended with chest CT in 6 months> done 04/20/14 no change >repeat in 12 m in tickle file     . GERD 11/30/2008    Qualifier: Diagnosis of  By: Marijean Niemann CMA, Danielle    . PVD (peripheral vascular disease) 10/18/2012  . Carotid artery stenosis 04/22/2012  . Coronary artery disease   . Heart murmur   . Emphysema/COPD   . COPD GOLD III with min reversibilty  08/11/2006    Followed in Pulmonary clinic/ [facility]/ [place] - PFT's 04/28/2013  FEV1 0.88 (40%) with ratio 44 and 14% better p B2 dlco 45 corrects to 83 - Trial of breo 04/28/2013 > improved symptoms  06/09/2013  - spirometry 06/04/2014 FEV1  0.76 (29%) ratio 45      . Anemia   . Iron deficiency anemia 01/25/2009    Qualifier: Diagnosis of  By: Henrene Pastor MD, Docia Chuck   . History of blood transfusion "couple times"    "related to bleeding in colon and esophagus"  . Arthritis     "left shoulder" (10/19/2014)    Past Surgical History  Procedure Laterality Date  . Iliac artery stent Left 2005    CIA  . Esophagogastroduodenoscopy  2012    normal  . Colonoscopy  July 2015    Dr. Henrene Pastor  . Tonsillectomy    . Knee arthroscopy with medial menisectomy Left 03/08/2014    Procedure: LEFT KNEE SCOPE WITH MEDIAL MENISECTOMY AND CHONDROPLASTY;  Surgeon: Ninetta Lights, MD;  Location: [facility];  Service: Orthopedics;  Laterality: Left;  . Left and right heart catheterization with coronary angiogram N/A 06/28/2014    Procedure: LEFT AND RIGHT  HEART CATHETERIZATION WITH CORONARY ANGIOGRAM;  Surgeon: Jerline Pain, MD;  Location: [facility] CATH LAB;  Service: Cardiovascular;  Laterality: N/A;  . Esophagogastroduodenoscopy N/A 08/17/2014    Procedure: ESOPHAGOGASTRODUODENOSCOPY (EGD);  Surgeon: Jerene Bears, MD;  Location: [facility] ENDOSCOPY;  Service: Endoscopy;  Laterality: N/A;  . Colonoscopy N/A 08/17/2014    Procedure: COLONOSCOPY;  Surgeon: Jerene Bears, MD;  Location: [facility] ENDOSCOPY;  Service: Endoscopy;  Laterality: N/A;  . Cardiac catheterization  2001; 06/28/2014  . Cardiac catheterization N/A 10/19/2014    Procedure: Coronary Stent Intervention;  Surgeon: Burnell Blanks, MD;  Location: [facility] CV LAB;  Service: Cardiovascular;  Laterality: N/A;  BMS Mid RCA    Family History  Problem Relation Age of Onset  . Lung disease Mother     pulm fibrosis  . Uterine cancer    . Colitis Father   . Colon cancer Neg Hx   . Heart disease Brother   . Hypertension Brother   . Hyperlipidemia Brother   . Heart disease Daughter  cad  . Hypertension Son     Social History   Social History  . Marital Status: Married    Spouse Name: N/A  . Number of Children: 4  . Years of Education: N/A   Occupational History  . Charity fundraiser    Social History Main Topics  . Smoking status: Former Smoker -- 0.75 packs/day for 60 years    Types: Cigarettes, E-cigarettes    Quit date: 04/17/2014  . Smokeless tobacco: Never Used  . Alcohol Use: 8.4 oz/week    14 Glasses of wine per week  . Drug Use: No  . Sexual Activity: No   Other Topics Concern  . Not on file   Social History Narrative    Current Outpatient Prescriptions  Medication Sig Dispense Refill  . amLODipine (NORVASC) 10 MG tablet Take 1 tablet (10 mg total) by mouth daily. 90 tablet 1  . Ascorbic Acid (VITAMIN C) 250 MG tablet Take 500 mg by mouth daily.     Marland Kitchen aspirin 81 MG chewable tablet Chew 1 tablet (81 mg total) by mouth daily.    Marland Kitchen atorvastatin (LIPITOR) 40  MG tablet TAKE ONE TABLET BY MOUTH ONCE DAILY 90 tablet 0  . BREO ELLIPTA 100-25 MCG/INH AEPB INHALE ONE PUFF INTO LUNGS ONCE DAILY IN THE MORNING 60 each 5  . clopidogrel (PLAVIX) 75 MG tablet Take 1 tablet (75 mg total) by mouth daily with breakfast. 30 tablet 11  . docusate sodium (COLACE) 100 MG capsule Take 100 mg by mouth 2 (two) times daily as needed (constipation).     . fenofibrate 160 MG tablet Take 160 mg by mouth daily.    . Ferrous Sulfate (IRON) 325 (65 FE) MG TABS Take 1 tablet by mouth 2 (two) times daily. 90 each 3  . Ipratropium-Albuterol (COMBIVENT RESPIMAT) 20-100 MCG/ACT AERS respimat Inhale 1 puff into the lungs 4 (four) times daily as needed for wheezing. 1 Inhaler 3  . metoprolol tartrate (LOPRESSOR) 25 MG tablet Take 0.5 tablets (12.5 mg total) by mouth 2 (two) times daily. 60 tablet 6  . Multiple Vitamins-Minerals (CENTRUM SILVER PO) Take 1 tablet by mouth daily.     . pantoprazole (PROTONIX) 40 MG tablet TAKE ONE TABLET BY MOUTH ONCE DAILY 90 tablet 3  . valsartan-hydrochlorothiazide (DIOVAN HCT) 160-12.5 MG per tablet Take 1 tablet by mouth daily. 90 tablet 1  . VOLTAREN 1 % GEL Apply 2 g topically 4 (four) times daily as needed (for arthritis).      No current facility-administered medications for this visit.    No Known Allergies    Review of Systems:  General:normal appetite, decreased energy, no weight gain, no weight loss, no fever Cardiac:no chest pain with exertion, no chest pain at rest, + SOB with exertion, no current resting SOB, + PND, + orthopnea, + palpitations, + arrhythmia, no atrial fibrillation, no LE edema, no dizzy spells, no syncope Respiratory:+ shortness of breath, no home oxygen, occasional productive cough, persistent dry cough, no bronchitis, no wheezing, no hemoptysis, no asthma, no pain with inspiration or cough, no sleep apnea, no CPAP at  night GI:no difficulty swallowing, occasional reflux, no frequent heartburn, no hiatal hernia, no abdominal pain, no constipation, no diarrhea, no hematochezia, no hematemesis, + chronic melena GU:no dysuria, no frequency, no urinary tract infection, no hematuria, no enlarged prostate, no kidney stones, no kidney disease Vascular:no pain suggestive of claudication, no pain in feet, no leg cramps, no varicose veins, no DVT, no non-healing foot ulcer Neuro:no  stroke, no TIA's, no seizures, no headaches, no temporary blindness one eye, no slurred speech, no peripheral neuropathy, no chronic pain, no instability of gait, no memory/cognitive dysfunction Musculoskeletal:no arthritis, no joint swelling, no myalgias, no difficulty walking, normal mobility  Skin:no rash, no itching, no skin infections, no pressure sores or ulcerations Psych:no anxiety, no depression, no nervousness, no unusual recent stress Eyes:no blurry vision, no floaters, no recent vision changes, + wears glasses or contacts ENT:no hearing loss, no loose or painful teeth, edentulous with full set dentures, last saw dentist many years ago Hematologic:no easy bruising, + abnormal bleeding, no clotting disorder, no frequent epistaxis Endocrine:no diabetes, does not check CBG's at home      Physical Exam:   BP 132/52 mmHg  Pulse 82  Resp 20  Ht 5\' 3"  (1.6 m)  Wt 149 lb (67.586 kg)  BMI 26.40 kg/m2  SpO2 94%  General:  Elderly,  well-appearing gentleman in no distress  HEENT:  Unremarkable   Neck:   no JVD, no bruits, no adenopathy or  thyromegaly  Chest:   clear to auscultation, symmetrical breath sounds, no wheezes, no rhonchi   CV:   RRR, grade III/VI crescendo/decrescendo murmur heard best at RSB,  no diastolic murmur  Abdomen:  soft, non-tender, no masses or organomegaly  Extremities:  warm, well-perfused, pulses not palpable in feet, no LE edema  Rectal/GU  Deferred  Neuro:   Grossly non-focal and symmetrical throughout  Skin:   Clean and dry, no rashes, no breakdown   Diagnostic Tests:   Transthoracic Echocardiography  Patient:  Terek, Bee MR #:    [id number] Study Date: 04/06/2014 Gender:   M Age:    73 Height:   160 cm Weight:   59.9 kg BSA:    1.64 m^2 Pt. Status: Room:  Arvin Collard, M.D. REFERRING  Candee Furbish, M.D. SONOGRAPHER Wyatt Mage, RDCS ATTENDING  Lyman Bishop MD PERFORMING  [facility], Outpatient  cc:  ------------------------------------------------------------------- LV EF: 55% -  60%  ------------------------------------------------------------------- Indications:   Murmur (R01.1).  ------------------------------------------------------------------- History:  PMH:  Chronic obstructive pulmonary disease. Risk factors: PVD. Carotid artery stenosis. GERD. GI bleed. H/o Barrett&'s esophagus. Rosacea. Anemia. Current tobacco use. Hypertension. Dyslipidemia.  ------------------------------------------------------------------- Study Conclusions  - Left ventricle: The cavity size was is borderline dilated. Wall thickness was normal. Systolic function was normal. The estimated ejection fraction was in the range of 55% to 60%. Wall motion was normal; there were no regional wall motion abnormalities. Doppler parameters are consistent with abnormal left ventricular relaxation (grade 1 diastolic dysfunction). The E/e&' ratio is between 8-15, suggesting indeterminate LV filling pressure. - Aortic valve: Heavily  calcified aortic valve leaflets. Peak and mean gradients of 60 and 34 mmHg, respectively. Based on an LVOT diameter of 2.0 cm, the calculated AVA is 0.8 cm2, consistent with severe aortic stenosis. There is mild regurgitation. Valve area (VTI): 0.74 cm^2. Valve area (Vmean): 0.68 cm^2. - Mitral valve: Calcified annulus. Mildly thickened leaflets . There was trivial regurgitation. - Left atrium: Severely dilated at 49 ml/m2.  Impressions:  - LVEF 55-60%, normal wall thickness, borderline dilated LV, normal wall motion, diastolic dysfunction, likely elevated LV filling pressure, there is severe aortic stenosis, peak and mean gradients of 60 mmHG and 35 mmHg, respectively. AVA is around 0.8 cm2. Mild AI, severely dilated LA  Transthoracic echocardiography. M-mode, complete 2D, spectral Doppler, and color Doppler. Birthdate: Patient birthdate: [birth year]. Age: Patient is 74 yr old. Sex: Gender: male. BMI: 23.4 kg/m^2. Blood  pressure:   130/50 Patient status: Outpatient. Study date: Study date: 04/06/2014. Study time: 08:15 AM. Location: [place] [facility]  -------------------------------------------------------------------  ------------------------------------------------------------------- Left ventricle: The cavity size was is borderline dilated. Wall thickness was normal. Systolic function was normal. The estimated ejection fraction was in the range of 55% to 60%. Wall motion was normal; there were no regional wall motion abnormalities. Doppler parameters are consistent with abnormal left ventricular relaxation (grade 1 diastolic dysfunction). The E/e&' ratio is between 8-15, suggesting indeterminate LV filling pressure.  ------------------------------------------------------------------- Aortic valve: Heavily calcified aortic valve leaflets. Peak and mean gradients of 60 and 34 mmHg, respectively. Based on an LVOT diameter of 2.0 cm, the  calculated AVA is 0.8 cm2, consistent with severe aortic stenosis. There is mild regurgitation. Doppler: VTI ratio of LVOT to aortic valve: 0.24. Valve area (VTI): 0.74 cm^2. Indexed valve area (VTI): 0.45 cm^2/m^2. Mean velocity ratio of LVOT to aortic valve: 0.22. Valve area (Vmean): 0.68 cm^2. Indexed valve area (Vmean): 0.41 cm^2/m^2.  Mean gradient (S): 29 mm Hg.  ------------------------------------------------------------------- Aorta: Aortic root: The aortic root was normal in size. Ascending aorta: The ascending aorta was normal in size.  ------------------------------------------------------------------- Mitral valve:  Calcified annulus. Mildly thickened leaflets . Doppler: There was trivial regurgitation.  Peak gradient (D): 3 mm Hg.  ------------------------------------------------------------------- Left atrium: Severely dilated at 49 ml/m2.  ------------------------------------------------------------------- Atrial septum: No defect or patent foramen ovale was identified.  ------------------------------------------------------------------- Right ventricle: The cavity size was normal. Wall thickness was normal. Systolic function was normal.  ------------------------------------------------------------------- Pulmonic valve:  The valve appears to be grossly normal. Doppler: There was no significant regurgitation.  ------------------------------------------------------------------- Tricuspid valve:  Doppler: There was no significant regurgitation.  ------------------------------------------------------------------- Pulmonary artery:  The main pulmonary artery was normal-sized.  ------------------------------------------------------------------- Right atrium: The atrium was normal in size.  ------------------------------------------------------------------- Pericardium: There was no pericardial  effusion.  ------------------------------------------------------------------- Systemic veins: Inferior vena cava: The vessel was normal in size. The respirophasic diameter changes were in the normal range (>= 50%), consistent with normal central venous pressure.  ------------------------------------------------------------------- Measurements  Left ventricle              Value     Reference LV ID, ED, PLAX chordal      (H)   55  mm    43 - 52 LV ID, ES, PLAX chordal      (H)   41  mm    23 - 38 LV fx shortening, PLAX chordal  (L)   25  %    >=29 LV PW thickness, ED            8   mm    --------- IVS/LV PW ratio, ED            1       <=1.3 Stroke volume, 2D             62  ml    --------- Stroke volume/bsa, 2D           38  ml/m^2  --------- LV e&', lateral              7.46 cm/s   --------- LV E/e&', lateral             12.23     --------- LV e&', medial               5.84 cm/s   --------- LV E/e&', medial  15.62     --------- LV e&', average              6.65 cm/s   --------- LV E/e&', average             13.71     ---------  Ventricular septum            Value     Reference IVS thickness, ED             8   mm    ---------  LVOT                   Value     Reference LVOT ID, S                20  mm    --------- LVOT area                 3.14 cm^2   --------- LVOT mean velocity, S           54.1 cm/s   --------- LVOT VTI, S                19.9 cm    ---------  Aortic valve               Value     Reference Aortic valve mean velocity, S       250  cm/s    --------- Aortic valve VTI, S            84.5 cm    --------- Aortic mean gradient, S          29  mm Hg  --------- VTI ratio, LVOT/AV            0.24      --------- Aortic valve area, VTI          0.74 cm^2   --------- Aortic valve area/bsa, VTI        0.45 cm^2/m^2 --------- Velocity ratio, mean, LVOT/AV       0.22      --------- Aortic valve area, mean velocity     0.68 cm^2   --------- Aortic valve area/bsa, mean        0.41 cm^2/m^2 --------- velocity Aortic regurg pressure half-time     316  ms    ---------  Aorta                   Value     Reference Aortic root ID, ED            32  mm    ---------  Left atrium                Value     Reference LA ID, A-P, ES              41  mm    --------- LA ID/bsa, A-P          (H)   2.5  cm/m^2  <=2.2 LA volume, S               78.5 ml    --------- LA volume/bsa, S             47.9 ml/m^2  --------- LA volume, ES, 1-p A4C          63.9 ml    --------- LA volume/bsa, ES, 1-p A4C        39  ml/m^2  --------- LA volume,  ES, 1-p A2C          87  ml    --------- LA volume/bsa, ES, 1-p A2C        53.1 ml/m^2  ---------  Mitral valve               Value     Reference Mitral E-wave peak velocity        91.2 cm/s   --------- Mitral deceleration time     (L)   102  ms    150 - 230 Mitral peak gradient, D          3   mm Hg  --------- Mitral E/A ratio, peak          0.7      ---------  Systemic veins              Value     Reference Estimated CVP               3   mm Hg  ---------  Right ventricle              Value      Reference RV e&', lateral              14.1 cm/s   ---------  Legend: (L) and (H) mark values outside specified reference range.  ------------------------------------------------------------------- Prepared and Electronically Authenticated by  Lyman Bishop MD 2016-01-15T10:38:01   Cardiac Cath:  HEMODYNAMICS:   Right atrium (RA): 12/10/8 mmHg Right ventricle (RV): 41/3/10 mmHg Pulmonary artery (PA): 39/17/26 mmHg Pulmonary capillary wedge pressure (PCWP): 17/14/14 mmHg  Cardiac output: Fick 4.87 liters/min Cardiac index: Fick 2.99  PA saturation: 67% FA saturation: 96%   Aortic pressure: 148/47 mmHg LV pressure: 267 mmHg systolic; LVEDP 17 mmHg.   Peak to peak gradient between the left ventricle and aorta was 21mmHg, mean 41.66mmHg, Calculated valve area 0.67cm squared.   ANGIOGRAPHIC DATA:   Left main: Calcified vessel branching into circumflex as well as LAD, no significant stenosis.  Left anterior descending (LAD): Densely calcified vessel with severe proximal/mid LAD stenosis surrounding major septal branch of approximately 90% at its highest, continuing to large diagonal branch. Disease length is approximately 15-20 mm. Major diagonal branch also has 70% proximal stenosis.  Circumflex artery (CIRC): In the RAO caudal view there appears to be ostial/proximal circumflex disease, perhaps 90% stenosis. This vessel is also densely calcified and is fairly small in caliber distally. There are 2 small obtuse marginal branches.  Right coronary artery (RCA): This vessel is also densely calcified. There is a significant 90% stenosis in the mid RCA. There is also 50% stenosis in the proximal RCA at the first major bend of the vessel. This vessel is dominant giving rise to the posterior descending artery. It is fairly large in caliber.  LEFT VENTRICULOGRAM: Left ventricular angiogram was done in the 30 RAO projection with hand injection and revealed  normal left ventricular wall motion and systolic function with an estimated ejection fraction of 65%.   IMPRESSIONS:  1. Severe triple-vessel coronary artery disease encompassing the proximal/mid LAD, ostial/proximal circumflex, mid RCA. All vessels are densely calcified. 2. Normal left ventricular systolic function. LVEDP 17 mmHg. Ejection fraction 65%. 3. Mildly elevated right-sided heart pressures. (Pulmonary artery systolic 39 mmHg-history of COPD) 4. Normal cardiac output. 5. Severe aortic stenosis-calculated valve area 0.67 cm with a mean gradient of 41 mmHg, peak to peak gradient of 44 mmHg. 6. Severe peripheral vascular disease-densely calcified carotid arteries noted  on fluoroscopy as well as calcified right subclavian artery as well as brachiocephalic artery. There is a napkin ring, ridge of calcium in the mid brachiocephalic that necessitated versa core wire. The ascending and proximal distal aorta demonstrates severe calcification/atherosclerosis.  RECOMMENDATION: We will forward results to Dr. Roxy Manns and Dr. Julianne Handler. Obviously his coronary anatomy as well as porcelain aorta and severe aortic stenosis make for a challenging situation. We will discuss all options.  Candee Furbish, MD    ADDENDUM REPORT: 07/09/2014 17:04  CLINICAL DATA: Aortic stenosis  EXAM: Cardiac TAVR CT  TECHNIQUE: The patient was scanned on a Philips 256 scanner. A 120 kV retrospective scan was triggered in the descending thoracic aorta at 111 HU's. Gantry rotation speed was 270 msecs and collimation was .9 mm. No beta blockade or nitro were given. The 3D data set was reconstructed in 5% intervals of the R-R cycle. Systolic and diastolic phases were analyzed on a dedicated work station using MPR, MIP and VRT modes. The patient received 80 cc of contrast.  FINDINGS: Aortic Valve: Trileaflet and heavily calcified. Mild annular calcification near base of left cusp. No significant  MAC.  Aorta: Porcelin aortic root with severe calcification through out the arch and descending thoracic aorta as well. Bilateral proximal subclavian artery stenosis  Sinotubular Junction: 2.2 cm  Ascending Thoracic Aorta: 2.8 cm  Descending Thoracic Aorta: 2.2 cm  Sinus of Valsalva Measurements:  Non-coronary: 29 mm  Right -coronary: 26 mm  Left -coronary: 28.5 mm  Coronary Artery Height above Annulus:  Left Main: 15.4 mm  Right Coronary: 14.8 mm  Virtual Basal Annulus Measurements:  Maximum/Minimum Diameter: 26.7 mm x 22.4 mm  Perimeter: 77 mm  Area: 465 mm2  Coronary Arteries: Sufficient height above annulus for delivery heavily calcified right dominant  Optimum Fluoroscopic Angle for Delivery: LAO 4 degress Cranial 1 degree  IMPRESSION: 1) Aortic annulus area of 465 mm2 suitable for 26 mm Sapien 3 valve  2) 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  On: 07/09/2014 17:04      Study Result     EXAM: OVER-READ INTERPRETATION CT CHEST  The following report is an over-read performed by radiologist Dr. Julian Hy of [facility] on 07/09/2014. This over-read does not include interpretation of cardiac or coronary anatomy or pathology. The coronary CTA interpretation by the cardiologist is attached.  COMPARISON: CT chest dated 04/20/2014  FINDINGS: No mediastinal lymphadenopathy.  No suspicious pulmonary nodules. Known right upper lobe pulmonary nodules are excluded from the current gated study.  Emphysematous changes. Scarring/atelectasis in the left upper lobe. Calcified pleural plaques.  No focal osseous lesions.  IMPRESSION: No significant extracardiac  findings.  Electronically Signed: By: Julian Hy M.D. On: 07/09/2014 11:17   CLINICAL DATA: 74 year old male with history of severe aortic stenosis. Preprocedural study prior to potential transcatheter aortic valve replacement (TAVR) procedure.  EXAM: CT ANGIOGRAPHY CHEST, ABDOMEN AND PELVIS  TECHNIQUE: Multidetector CT imaging through the chest, abdomen and pelvis was performed using the standard protocol during bolus administration of intravenous contrast. Multiplanar reconstructed images and MIPs were obtained and reviewed to evaluate the vascular anatomy.  CONTRAST: 58mL OMNIPAQUE IOHEXOL 350 MG/ML SOLN  COMPARISON: Chest CT 09/08/2013.  FINDINGS: CTA CHEST FINDINGS  Mediastinum/Lymph Nodes: Heart size is borderline enlarged. There is no significant  pericardial fluid, thickening or pericardial calcification. There is atherosclerosis of the thoracic aorta, the great vessels of the mediastinum and the coronary arteries, including calcified atherosclerotic plaque in the left main, left anterior descending, left circumflex and right coronary arteries. In addition, there is high-grade stenosis at the origin of the left subclavian artery, and in the proximal right subclavian artery. Severe thickening calcifications of the aortic valve. No pathologically enlarged mediastinal or hilar lymph nodes. Esophagus is unremarkable in appearance. No axillary lymphadenopathy.  Lungs/Pleura: Extensive calcified pleural plaques are noted throughout the thorax bilaterally, indicative of asbestos related pleural disease. Multiple pulmonary nodules are noted in the lungs bilaterally, largest which measures 8 x 4 mm in the apex of the right upper lobe (image 7 of series 403). These nodules are generally similar in size, number and distribution to remote prior study 03/10/2013, and are considered likely benign. No acute consolidative airspace disease. No pleural effusions.  No definite fibrotic changes.  Musculoskeletal/Soft Tissues: There are no aggressive appearing lytic or blastic lesions noted in the visualized portions of the skeleton.  CTA ABDOMEN AND PELVIS FINDINGS  Hepatobiliary: 8 mm low attenuation lesion in the medial aspect of segment 6 of the liver is too small to characterize, but is statistically likely to represent a small cyst. No other suspicious appearing hepatic lesions are noted. No intra or extrahepatic biliary ductal dilatation. Tiny calcified gallstone lying dependently in the gallbladder. No current findings to suggest an acute cholecystitis at this time.  Pancreas: Unremarkable.  Spleen: Unremarkable.  Adrenals/Urinary Tract: Sub cm low-attenuation lesion in the upper pole of the right kidney is too small to definitively characterize, but is favored to represent a tiny cyst. Left kidney is normal in appearance. Bilateral adrenal glands are normal in appearance. No hydroureteronephrosis. Urinary bladder is unremarkable.  Stomach/Bowel: The appearance of the stomach is normal. No pathologic dilatation of small bowel or colon. A few scattered colonic diverticulae are noted, without surrounding inflammatory changes to suggest an acute diverticulitis at this time. Normal appendix.  Vascular/Lymphatic: Vascular findings and measurements pertinent to potential TAVR procedure, as detailed below. Severe atherosclerosis. 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 Probable stent in the left common iliac artery distally.  Reproductive: Prostate gland and seminal vesicles are unremarkable in appearance. Bilateral vasectomy clips.  Other: No significant volume of ascites. No pneumoperitoneum.  Musculoskeletal: Small well-defined sclerotic lesion in the  left ilium adjacent to the sacroiliac joint, favored to represent a tiny bone island. There are no aggressive appearing lytic or blastic lesions noted in the visualized portions of the skeleton.  VASCULAR MEASUREMENTS PERTINENT TO TAVR:  AORTA:  Minimal Aortic Diameter - 10 x 9 mm  Severity of Aortic Calcification - severe  RIGHT PELVIS:  Right Common Iliac Artery -  Minimal Diameter - 6.8 x 3.6 mm  Tortuosity - mild  Calcification - severe  Right External Iliac Artery -  Minimal Diameter - 3.5 x 2.7 mm  Tortuosity - moderate  Calcification - severe  Right Common Femoral Artery -  Minimal Diameter - 5.3 x 3.2 mm  Tortuosity - mild  Calcification - severe  LEFT PELVIS:  Left Common Iliac Artery -  Minimal Diameter - 3.9 x 3.7 mm  Tortuosity - mild  Calcification - severe  Left External Iliac Artery -  Minimal Diameter - 4.5 x 3.1  mm  Tortuosity - moderate  Calcification - severe  Left Common Femoral Artery -  Minimal Diameter - 2.8 x 2.1 mm  Tortuosity - mild  Calcification - severe  Review of the MIP images confirms the above findings.  IMPRESSION: 1. Severe atherosclerosis, particularly throughout the pelvic arterial access vessels, as detailed above. This patient does not have suitable pelvic arterial access for TAVR procedure. 2. 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 3. Asbestos related pleural disease. No fibrotic changes in the lung parenchyma to suggest asbestosis at this time. 4. Multiple small pulmonary nodules scattered throughout the lungs bilaterally, similar in size, number and distribution to prior examinations dating back to 03/10/2013, strongly favored to be benign. Attention on one additional noncontrast chest CT in 12 months is suggested to confirm greater than 24 months of stability. 5. Cholelithiasis without evidence of  acute cholecystitis at this time. 6. Mild colonic diverticulosis without evidence of acute diverticulitis at this time. 7. Additional incidental findings, as above.   Electronically Signed  By: Vinnie Langton M.D.  On: 07/09/2014 15:24   Pulmonary Function Tests  BaselinePost-bronchodilator  FVC2.00 L (65% predicted)FVC2.17 L (71% predicted) FEV10.88 L (40% predicted)FEV11.02 L (46% predicted) FEF25-750.32 L (20% predicted)FEF25-750.43 L (26% predicted)  TLC5.80 L (104% predicted) RV3.40 L (159% predicted) DLCO45% predicted    STS Risk Calculator  ProcedureAVR  Risk of Mortality5.9% Morbidity or Mortality27.9% Prolonged LOS12.7% Short LOS24.4% Permanent Stroke1.8% Prolonged Vent Support17.7% DSW Infection0.5% Renal Failure6.4% Reoperation12.5%    Impression:  The patient has stage D severe symptomatic aortic stenosis with NYHA class III symptoms of tiredness and exertional fatigue and shortness of breath with minimal activity. I have personally reviewed his echo, cath and CT studies. Echo shows that all 3 leaflets of the patient's aortic valve are partially calcified, thickened, and have severely restricted leaflet mobility. The mean gradient was 34 mm Hg and the DI was 0.22. Catheterization shows severe 3 vessel coronary disease. The RCA lesion has been stented but there is still high grade proximal  to mid LAD/diagonal stenosis and high grade ostial LCx stenosis. Unfortunately he has severe aortic and great vessel calcification and open surgical treatment would be very difficult and risky. 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  I think TAVR would be a good alternative for this patient. His cardiac CT shows that he is a candidate for a 26 mm Sapien 3 valve. His pelvic vasculature is not adequate for a transfemoral approach and his ascending aorta and great vessels are very calcified so a transapical route would be the best option. I discussed all of this with him and answered his questions. He would like to proceed with TAVR.  Following the decision to proceed with transcatheter aortic valve replacement, a discussion has been held regarding what types of management strategies would be attempted intraoperatively in the event of life-threatening complications, including whether or not the patient would be considered a candidate for the use of cardiopulmonary bypass and/or conversion to open sternotomy for attempted surgical intervention.  The patient has been advised of a variety of complications that might develop including but not limited to risks of death, stroke, paravalvular leak, aortic dissection or other major vascular complications, aortic annulus rupture, device embolization, cardiac rupture or perforation, mitral regurgitation, acute myocardial infarction, arrhythmia, heart block or bradycardia requiring permanent pacemaker placement, congestive heart failure, respiratory failure, renal failure, pneumonia, infection, other late complications related to structural valve deterioration  or migration, or other complications that might ultimately cause a temporary or permanent loss of functional independence or other long term morbidity.  The patient provides full informed consent for the procedure as described and all questions were answered.    Plan:  Transapical TAVR on  11/20/2014.    Gaye Pollack, MD

## 2014-11-05 ENCOUNTER — Ambulatory Visit (INDEPENDENT_AMBULATORY_CARE_PROVIDER_SITE_OTHER): Payer: Medicare Other | Admitting: Family Medicine

## 2014-11-05 ENCOUNTER — Encounter: Payer: Self-pay | Admitting: Family Medicine

## 2014-11-05 ENCOUNTER — Ambulatory Visit: Payer: Medicare Other | Admitting: Family Medicine

## 2014-11-05 VITALS — BP 120/52 | HR 79 | Temp 97.6°F | Ht 63.0 in | Wt 148.0 lb

## 2014-11-05 DIAGNOSIS — J449 Chronic obstructive pulmonary disease, unspecified: Secondary | ICD-10-CM

## 2014-11-05 DIAGNOSIS — I2510 Atherosclerotic heart disease of native coronary artery without angina pectoris: Secondary | ICD-10-CM

## 2014-11-05 DIAGNOSIS — E785 Hyperlipidemia, unspecified: Secondary | ICD-10-CM | POA: Diagnosis not present

## 2014-11-05 DIAGNOSIS — Z87891 Personal history of nicotine dependence: Secondary | ICD-10-CM

## 2014-11-05 DIAGNOSIS — I350 Nonrheumatic aortic (valve) stenosis: Secondary | ICD-10-CM

## 2014-11-05 DIAGNOSIS — I10 Essential (primary) hypertension: Secondary | ICD-10-CM

## 2014-11-05 LAB — COMPREHENSIVE METABOLIC PANEL
ALT: 21 U/L (ref 0–53)
AST: 22 U/L (ref 0–37)
Albumin: 4.3 g/dL (ref 3.5–5.2)
Alkaline Phosphatase: 32 U/L — ABNORMAL LOW (ref 39–117)
BUN: 23 mg/dL (ref 6–23)
CALCIUM: 9.9 mg/dL (ref 8.4–10.5)
CHLORIDE: 101 meq/L (ref 96–112)
CO2: 27 mEq/L (ref 19–32)
Creatinine, Ser: 1.08 mg/dL (ref 0.40–1.50)
GFR: 70.61 mL/min (ref >60.00)
Glucose, Bld: 74 mg/dL (ref 70–99)
POTASSIUM: 4.5 meq/L (ref 3.5–5.1)
SODIUM: 135 meq/L (ref 135–145)
Total Bilirubin: 0.3 mg/dL (ref 0.2–1.2)
Total Protein: 7.5 g/dL (ref 6.0–8.3)

## 2014-11-05 LAB — CBC WITH DIFFERENTIAL/PLATELET
Basophils Absolute: 0 10*3/uL (ref 0.0–0.1)
Basophils Relative: 0.4 % (ref 0.0–3.0)
EOS PCT: 3.8 % (ref 0.0–5.0)
Eosinophils Absolute: 0.4 10*3/uL (ref 0.0–0.7)
HEMATOCRIT: 36.3 % — AB (ref 39.0–52.0)
HEMOGLOBIN: 11.8 g/dL — AB (ref 13.0–17.0)
LYMPHS PCT: 8.8 % — AB (ref 12.0–46.0)
Lymphs Abs: 0.9 10*3/uL (ref 0.7–4.0)
MCHC: 32.5 g/dL (ref 30.0–36.0)
MCV: 97.4 fl (ref 78.0–100.0)
MONO ABS: 1.3 10*3/uL — AB (ref 0.1–1.0)
Monocytes Relative: 13.4 % — ABNORMAL HIGH (ref 3.0–12.0)
Neutro Abs: 7.4 10*3/uL (ref 1.4–7.7)
Neutrophils Relative %: 73.6 % (ref 43.0–77.0)
Platelets: 336 10*3/uL (ref 150.0–400.0)
RBC: 3.73 Mil/uL — AB (ref 4.22–5.81)
RDW: 14.3 % (ref 11.5–15.5)
WBC: 10.1 10*3/uL (ref 4.0–10.5)

## 2014-11-05 LAB — LIPID PANEL
Cholesterol: 170 mg/dL (ref 0–200)
HDL: 36.2 mg/dL — ABNORMAL LOW (ref >39.00)
LDL CALC: 96 mg/dL (ref 0–99)
NonHDL: 134.1
TRIGLYCERIDES: 192 mg/dL — AB (ref 0.0–149.0)
Total CHOL/HDL Ratio: 5
VLDL: 38.4 mg/dL (ref 0.0–40.0)

## 2014-11-05 NOTE — Patient Instructions (Signed)

## 2014-11-05 NOTE — Assessment & Plan Note (Signed)
con't diovan/ hct and amolodipine rto 6 months for cpe or sooner prn

## 2014-11-05 NOTE — Assessment & Plan Note (Signed)
Per pulmonary con't breo and combivent

## 2014-11-05 NOTE — Progress Notes (Signed)
Pre visit review using our clinic review tool, if applicable. No additional management support is needed unless otherwise documented below in the visit note. 

## 2014-11-05 NOTE — Assessment & Plan Note (Signed)
Pt admits to sneaking a few since quitting but understands the importance of quitting for good

## 2014-11-05 NOTE — Assessment & Plan Note (Signed)
Pt to have aortic valve replacement 7/30

## 2014-11-05 NOTE — Assessment & Plan Note (Signed)
con't lipitor Check labs today

## 2014-11-05 NOTE — Progress Notes (Signed)
Patient ID: Ronald Bell, male    DOB: 25-Jun-1940  Age: 74 y.o. MRN: [id number]    Subjective:  Subjective HPI Ronald Bell presents for hospital f/u for unstable angina and aortic stenosis.  He has AVR scheduled for 7/30.  He has had no more chest pain.  He is due to have labs today.    Review of Systems  Constitutional: Negative for diaphoresis, appetite change, fatigue and unexpected weight change.  Eyes: Negative for pain, redness and visual disturbance.  Respiratory: Negative for cough, chest tightness, shortness of breath and wheezing.   Cardiovascular: Negative for chest pain, palpitations and leg swelling.  Endocrine: Negative for cold intolerance, heat intolerance, polydipsia, polyphagia and polyuria.  Genitourinary: Negative for dysuria, frequency and difficulty urinating.  Neurological: Negative for dizziness, light-headedness, numbness and headaches.    History Past Medical History  Diagnosis Date  . Polyp of nasal cavity   . Rosacea   . Benign neoplasm of colon   . Tobacco abuse   . Psychosexual dysfunction with inhibited sexual excitement   . Hypertension   . Hyperlipidemia   . GERD (gastroesophageal reflux disease)   . GI bleed 2010    4 units PRBCs  . Adenomatous colon polyp   . Peripheral vascular disease   . Irregular heartbeat   . Shingles   . Carotid artery occlusion   . Angiodysplasia of intestine with hemorrhage   . Pleural plaque with presence of asbestos 03/27/2013    Followed in Pulmonary clinic/ [facility]/ [place] - F/u CT 09/08/2013 1. Stable extensive calcified pleural plaque formation consistent with asbestos exposure. 2. Multiple pulmonary nodules are unchanged from the CT of 6 months ago. Given risk factors for lung cancer, continued follow up is recommended with chest CT in 6 months> done 04/20/14 no change >repeat in 12 m in tickle file     . GERD 11/30/2008    Qualifier: Diagnosis of  By: Marijean Niemann CMA, Danielle    . PVD  (peripheral vascular disease) 10/18/2012  . Carotid artery stenosis 04/22/2012  . Coronary artery disease   . Heart murmur   . Emphysema/COPD   . COPD GOLD III with min reversibilty  08/11/2006    Followed in Pulmonary clinic/ [facility]/ [place] - PFT's 04/28/2013  FEV1 0.88 (40%) with ratio 44 and 14% better p B2 dlco 45 corrects to 83 - Trial of breo 04/28/2013 > improved symptoms  06/09/2013  - spirometry 06/04/2014 FEV1  0.76 (29%) ratio 45      . Anemia   . Iron deficiency anemia 01/25/2009    Qualifier: Diagnosis of  By: Henrene Pastor MD, Docia Chuck   . History of blood transfusion "couple times"    "related to bleeding in colon and esophagus"  . Arthritis     "left shoulder" (10/19/2014)    He has past surgical history that includes Iliac artery stent (Left, 2005); Esophagogastroduodenoscopy (2012); Colonoscopy (July 2015); Tonsillectomy; Knee arthroscopy with medial menisectomy (Left, 03/08/2014); left and right heart catheterization with coronary angiogram (N/A, 06/28/2014); Esophagogastroduodenoscopy (N/A, 08/17/2014); Colonoscopy (N/A, 08/17/2014); Cardiac catheterization (2001; 06/28/2014); and Cardiac catheterization (N/A, 10/19/2014).   His family history includes Colitis in his father; Heart disease in his brother and daughter; Hyperlipidemia in his brother; Hypertension in his brother and son; Lung disease in his mother; Uterine cancer in an other family member. There is no history of Colon cancer.He reports that he quit smoking about 6 months ago. His smoking use included Cigarettes and E-cigarettes. He has a  60 pack-year smoking history. He has never used smokeless tobacco. He reports that he drinks about 8.4 oz of alcohol per week. He reports that he does not use illicit drugs.  Current Outpatient Prescriptions on File Prior to Visit  Medication Sig Dispense Refill  . amLODipine (NORVASC) 10 MG tablet Take 1 tablet (10 mg total) by mouth daily. 90 tablet 1  . Ascorbic Acid (VITAMIN C) 250 MG tablet  Take 500 mg by mouth daily.     Marland Kitchen aspirin 81 MG chewable tablet Chew 1 tablet (81 mg total) by mouth daily.    Marland Kitchen atorvastatin (LIPITOR) 40 MG tablet TAKE ONE TABLET BY MOUTH ONCE DAILY 90 tablet 0  . BREO ELLIPTA 100-25 MCG/INH AEPB INHALE ONE PUFF INTO LUNGS ONCE DAILY IN THE MORNING 60 each 5  . clopidogrel (PLAVIX) 75 MG tablet Take 1 tablet (75 mg total) by mouth daily with breakfast. 30 tablet 11  . docusate sodium (COLACE) 100 MG capsule Take 100 mg by mouth 2 (two) times daily as needed (constipation).     . fenofibrate 160 MG tablet Take 160 mg by mouth daily.    . Ferrous Sulfate (IRON) 325 (65 FE) MG TABS Take 1 tablet by mouth 2 (two) times daily. 90 each 3  . Ipratropium-Albuterol (COMBIVENT RESPIMAT) 20-100 MCG/ACT AERS respimat Inhale 1 puff into the lungs 4 (four) times daily as needed for wheezing. 1 Inhaler 3  . metoprolol tartrate (LOPRESSOR) 25 MG tablet Take 0.5 tablets (12.5 mg total) by mouth 2 (two) times daily. 60 tablet 6  . Multiple Vitamins-Minerals (CENTRUM SILVER PO) Take 1 tablet by mouth daily.     . pantoprazole (PROTONIX) 40 MG tablet TAKE ONE TABLET BY MOUTH ONCE DAILY 90 tablet 3  . valsartan-hydrochlorothiazide (DIOVAN HCT) 160-12.5 MG per tablet Take 1 tablet by mouth daily. 90 tablet 1  . VOLTAREN 1 % GEL Apply 2 g topically 4 (four) times daily as needed (for arthritis).      No current facility-administered medications on file prior to visit.     Objective:  Objective Physical Exam  Constitutional: He is oriented to person, place, and time. Vital signs are normal. He appears well-developed and well-nourished. He is sleeping.  HENT:  Head: Normocephalic and atraumatic.  Mouth/Throat: Oropharynx is clear and moist.  Eyes: EOM are normal. Pupils are equal, round, and reactive to light.  Neck: Normal range of motion. Neck supple. No thyromegaly present.  Cardiovascular: Normal rate and regular rhythm.   Murmur heard. Pulmonary/Chest: Effort normal and  breath sounds normal. No respiratory distress. He has no wheezes. He has no rales. He exhibits no tenderness.  Musculoskeletal: He exhibits no edema or tenderness.  Neurological: He is alert and oriented to person, place, and time.  Skin: Skin is warm and dry.  Psychiatric: He has a normal mood and affect. His behavior is normal. Judgment and thought content normal.   BP 120/52 mmHg  Pulse 79  Temp(Src) 97.6 F (36.4 C) (Oral)  Ht 5\' 3"  (1.6 m)  Wt 148 lb (67.132 kg)  BMI 26.22 kg/m2  SpO2 97% Wt Readings from Last 3 Encounters:  11/05/14 148 lb (67.132 kg)  11/01/14 149 lb (67.586 kg)  10/20/14 149 lb 7.6 oz (67.8 kg)     Lab Results  Component Value Date   WBC 8.3 10/20/2014   HGB 13.0 10/20/2014   HCT 39.0 10/20/2014   PLT 228 10/20/2014   GLUCOSE 128* 10/20/2014   CHOL 154 05/10/2014  TRIG 125.0 05/10/2014   HDL 55.40 05/10/2014   LDLCALC 74 05/10/2014   ALT 29 08/16/2014   AST 23 08/16/2014   NA 135 10/20/2014   K 4.2 10/20/2014   CL 105 10/20/2014   CREATININE 1.12 10/20/2014   BUN 15 10/20/2014   CO2 23 10/20/2014   TSH 1.206 06/01/2014   PSA 1.47 05/10/2014   INR 1.2* 10/15/2014   HGBA1C 5.3 06/01/2014   MICROALBUR 1.1 03/05/2014    No results found.   Assessment & Plan:  Plan I am having Ronald Bell maintain his Multiple Vitamins-Minerals (CENTRUM SILVER PO), vitamin C, Iron, Ipratropium-Albuterol, VOLTAREN, docusate sodium, metoprolol tartrate, pantoprazole, valsartan-hydrochlorothiazide, fenofibrate, BREO ELLIPTA, amLODipine, atorvastatin, aspirin, and clopidogrel.  No orders of the defined types were placed in this encounter.    Problem List Items Addressed This Visit    Severe aortic stenosis    Pt to have aortic valve replacement 7/30      Hyperlipidemia - Primary    con't lipitor Check labs today      Relevant Orders   CBC with Differential/Platelet   Lipid panel   Comprehensive metabolic panel   Essential hypertension (Chronic)     con't diovan/ hct and amolodipine rto 6 months for cpe or sooner prn      COPD GOLD III with min reversibilty  (Chronic)    Per pulmonary con't breo and combivent       Other Visit Diagnoses    CAD in native artery        Relevant Orders    CBC with Differential/Platelet    Lipid panel    Comprehensive metabolic panel    Former smoker        Relevant Orders    Ambulatory Referral for Lung Cancer Scre       Follow-up: Return in about 6 months (around 05/08/2015), or if symptoms worsen or fail to improve, for annual exam, fasting.  [name], DO

## 2014-11-09 ENCOUNTER — Telehealth: Payer: Self-pay | Admitting: Acute Care

## 2014-11-09 NOTE — Telephone Encounter (Signed)
I called Mr. Dickerman at the referral of Dr. Etter Sjogren to schedule a lung cancer screening. He told me he is having an aortic valve replacement on 11/20/14. We have agreed that I will call him again in 8 weeks to get him scheduled for the screening, after he has recovered from the surgery. He verbalized understanding.

## 2014-11-13 ENCOUNTER — Other Ambulatory Visit (HOSPITAL_COMMUNITY): Payer: Medicare Other

## 2014-11-13 ENCOUNTER — Ambulatory Visit: Payer: Medicare Other | Admitting: Vascular Surgery

## 2014-11-13 ENCOUNTER — Other Ambulatory Visit: Payer: Self-pay

## 2014-11-13 MED ORDER — FENOFIBRATE 160 MG PO TABS: 160.0000 mg | ORAL_TABLET | Freq: Every day | ORAL | Status: DC

## 2014-11-15 ENCOUNTER — Telehealth: Payer: Self-pay | Admitting: Cardiovascular Disease

## 2014-11-15 NOTE — Telephone Encounter (Signed)
New Message   Ronald Bell is calling for advance home care she states  UVQ 224 form was filled out incorrectly by doctor Angelena Form  and she states it is for the Pt oxygen   she will send another fax over  Attention to [facility].    Please call her

## 2014-11-15 NOTE — Telephone Encounter (Signed)
Spoke with Lattie Haw and she will refax orders along with initial information provided when oxygen arranged during pt's hospitalization

## 2014-11-15 NOTE — Telephone Encounter (Signed)
Left message to call back  

## 2014-11-16 ENCOUNTER — Encounter (HOSPITAL_COMMUNITY): Payer: Self-pay

## 2014-11-16 ENCOUNTER — Other Ambulatory Visit: Payer: Self-pay | Admitting: *Deleted

## 2014-11-16 ENCOUNTER — Encounter (HOSPITAL_COMMUNITY)
Admission: RE | Admit: 2014-11-16 | Discharge: 2014-11-16 | Disposition: A | Discharge status: Home / Self Care | Payer: Medicare Other | Source: Ambulatory Visit | Attending: Thoracic Surgery (Cardiothoracic Vascular Surgery) | Admitting: Thoracic Surgery (Cardiothoracic Vascular Surgery)

## 2014-11-16 ENCOUNTER — Ambulatory Visit (HOSPITAL_COMMUNITY)
Admission: RE | Admit: 2014-11-16 | Discharge: 2014-11-16 | Disposition: A | Discharge status: Home / Self Care | Payer: Medicare Other | Source: Ambulatory Visit | Attending: Thoracic Surgery (Cardiothoracic Vascular Surgery) | Admitting: Thoracic Surgery (Cardiothoracic Vascular Surgery)

## 2014-11-16 VITALS — BP 125/60 | HR 86 | Temp 97.5°F | Resp 20 | Ht 63.0 in | Wt 145.5 lb

## 2014-11-16 DIAGNOSIS — I10 Essential (primary) hypertension: Secondary | ICD-10-CM | POA: Diagnosis not present

## 2014-11-16 DIAGNOSIS — I6529 Occlusion and stenosis of unspecified carotid artery: Secondary | ICD-10-CM | POA: Insufficient documentation

## 2014-11-16 DIAGNOSIS — I739 Peripheral vascular disease, unspecified: Secondary | ICD-10-CM | POA: Insufficient documentation

## 2014-11-16 DIAGNOSIS — Z01818 Encounter for other preprocedural examination: Secondary | ICD-10-CM | POA: Insufficient documentation

## 2014-11-16 DIAGNOSIS — J849 Interstitial pulmonary disease, unspecified: Secondary | ICD-10-CM | POA: Diagnosis not present

## 2014-11-16 DIAGNOSIS — D509 Iron deficiency anemia, unspecified: Secondary | ICD-10-CM | POA: Diagnosis not present

## 2014-11-16 DIAGNOSIS — I350 Nonrheumatic aortic (valve) stenosis: Secondary | ICD-10-CM

## 2014-11-16 DIAGNOSIS — R918 Other nonspecific abnormal finding of lung field: Secondary | ICD-10-CM | POA: Diagnosis not present

## 2014-11-16 DIAGNOSIS — Z01812 Encounter for preprocedural laboratory examination: Secondary | ICD-10-CM | POA: Diagnosis not present

## 2014-11-16 HISTORY — DX: Anxiety disorder, unspecified: F41.9

## 2014-11-16 LAB — COMPREHENSIVE METABOLIC PANEL
ALT: 20 U/L (ref 17–63)
ANION GAP: 10 (ref 5–15)
AST: 23 U/L (ref 15–41)
Albumin: 4 g/dL (ref 3.5–5.0)
Alkaline Phosphatase: 39 U/L (ref 38–126)
BUN: 20 mg/dL (ref 6–20)
CHLORIDE: 103 mmol/L (ref 101–111)
CO2: 19 mmol/L — ABNORMAL LOW (ref 22–32)
Calcium: 9.5 mg/dL (ref 8.9–10.3)
Creatinine, Ser: 1.2 mg/dL (ref 0.61–1.24)
GFR calc Af Amer: >60 mL/min (ref >60)
GFR, EST NON AFRICAN AMERICAN: 57 mL/min — AB (ref >60)
Glucose, Bld: 105 mg/dL — ABNORMAL HIGH (ref 65–99)
POTASSIUM: 4.6 mmol/L (ref 3.5–5.1)
Sodium: 132 mmol/L — ABNORMAL LOW (ref 135–145)
TOTAL PROTEIN: 7.4 g/dL (ref 6.5–8.1)
Total Bilirubin: 0.6 mg/dL (ref 0.3–1.2)

## 2014-11-16 LAB — BLOOD GAS, ARTERIAL
ACID-BASE DEFICIT: 1.6 mmol/L (ref 0.0–2.0)
Bicarbonate: 21.8 mEq/L (ref 20.0–24.0)
DRAWN BY: 206361
O2 SAT: 96 %
PH ART: 7.453 — AB (ref 7.350–7.450)
Patient temperature: 98.6
TCO2: 22.7 mmol/L (ref 0–100)
pCO2 arterial: 31.6 mmHg — ABNORMAL LOW (ref 35.0–45.0)
pO2, Arterial: 73.7 mmHg — ABNORMAL LOW (ref 80.0–100.0)

## 2014-11-16 LAB — CBC
HCT: 33.9 % — ABNORMAL LOW (ref 39.0–52.0)
Hemoglobin: 11.5 g/dL — ABNORMAL LOW (ref 13.0–17.0)
MCH: 33 pg (ref 26.0–34.0)
MCHC: 33.9 g/dL (ref 30.0–36.0)
MCV: 97.4 fL (ref 78.0–100.0)
PLATELETS: 308 10*3/uL (ref 150–400)
RBC: 3.48 MIL/uL — ABNORMAL LOW (ref 4.22–5.81)
RDW: 14.8 % (ref 11.5–15.5)
WBC: 10 10*3/uL (ref 4.0–10.5)

## 2014-11-16 LAB — APTT: aPTT: 29 seconds (ref 24–37)

## 2014-11-16 LAB — PROTIME-INR
INR: 1.16 (ref 0.00–1.49)
PROTHROMBIN TIME: 15 s (ref 11.6–15.2)

## 2014-11-16 LAB — URINALYSIS, ROUTINE W REFLEX MICROSCOPIC
BILIRUBIN URINE: NEGATIVE
Glucose, UA: NEGATIVE mg/dL
HGB URINE DIPSTICK: NEGATIVE
KETONES UR: NEGATIVE mg/dL
Leukocytes, UA: NEGATIVE
NITRITE: NEGATIVE
PROTEIN: NEGATIVE mg/dL
SPECIFIC GRAVITY, URINE: 1.015 (ref 1.005–1.030)
UROBILINOGEN UA: 0.2 mg/dL (ref 0.0–1.0)
pH: 6.5 (ref 5.0–8.0)

## 2014-11-16 LAB — SURGICAL PCR SCREEN
MRSA, PCR: NEGATIVE
STAPHYLOCOCCUS AUREUS: NEGATIVE

## 2014-11-16 MED ORDER — SODIUM CHLORIDE 0.9 % IV SOLN: INTRAVENOUS | Status: DC

## 2014-11-16 MED ORDER — CHLORHEXIDINE GLUCONATE 4 % EX LIQD: 30.0000 mL | CUTANEOUS | Status: DC

## 2014-11-16 MED ORDER — CHLORHEXIDINE GLUCONATE 4 % EX LIQD: 60.0000 mL | Freq: Once | CUTANEOUS | Status: DC

## 2014-11-16 NOTE — Pre-Procedure Instructions (Signed)
    Ronald Bell  11/16/2014      [facility] [address], [place] [address] [place] [address] Phone: [phone] Fax: [phone]    Your procedure is scheduled on 11/20/14.  Report to [facility] Admitting at 530 A.M.  Call this number if you have problems the morning of surgery:  [phone]   Remember:  Do not eat food or drink liquids after midnight.  Take these medicines the morning of surgery with A SIP OF WATER --norvasc,aspirin,all inhalers,metoprolol,protonix   Do not wear jewelry, make-up or nail polish.  Do not wear lotions, powders, or perfumes.  You may wear deodorant.  Do not shave 48 hours prior to surgery.  Men may shave face and neck.  Do not bring valuables to the hospital.  [facility] is not responsible for any belongings or valuables.  Contacts, dentures or bridgework may not be worn into surgery.  Leave your suitcase in the car.  After surgery it may be brought to your room.  For patients admitted to the hospital, discharge time will be determined by your treatment team.  Patients discharged the day of surgery will not be allowed to drive home.   Name and phone number of your driver:    Special instructions:    Please read over the following fact sheets that you were given. Pain Booklet, Coughing and Deep Breathing, Blood Transfusion Information, MRSA Information and Surgical Site Infection Prevention

## 2014-11-17 LAB — HEMOGLOBIN A1C
HEMOGLOBIN A1C: 6.1 % — AB (ref 4.8–5.6)
Mean Plasma Glucose: 128 mg/dL

## 2014-11-19 ENCOUNTER — Encounter: Payer: Self-pay | Admitting: Thoracic Surgery (Cardiothoracic Vascular Surgery)

## 2014-11-19 ENCOUNTER — Ambulatory Visit (INDEPENDENT_AMBULATORY_CARE_PROVIDER_SITE_OTHER): Payer: Medicare Other | Admitting: Thoracic Surgery (Cardiothoracic Vascular Surgery)

## 2014-11-19 VITALS — BP 116/57 | HR 79 | Resp 20 | Ht 63.0 in | Wt 145.0 lb

## 2014-11-19 DIAGNOSIS — I350 Nonrheumatic aortic (valve) stenosis: Secondary | ICD-10-CM

## 2014-11-19 DIAGNOSIS — I2510 Atherosclerotic heart disease of native coronary artery without angina pectoris: Secondary | ICD-10-CM

## 2014-11-19 MED ORDER — SODIUM CHLORIDE 0.9 % IV SOLN
INTRAVENOUS | Status: AC
  Administered 2014-11-20: 1.6 [IU]/h via INTRAVENOUS
  Filled 2014-11-19: qty 2.5

## 2014-11-19 MED ORDER — SODIUM CHLORIDE 0.9 % IV SOLN
INTRAVENOUS | Status: DC
  Filled 2014-11-19: qty 30

## 2014-11-19 MED ORDER — DEXMEDETOMIDINE HCL IN NACL 400 MCG/100ML IV SOLN
0.1000 ug/kg/h | INTRAVENOUS | Status: AC
Start: 2014-11-20 — End: 2014-11-20
  Administered 2014-11-20: .3 ug/kg/h via INTRAVENOUS
  Filled 2014-11-19: qty 100

## 2014-11-19 MED ORDER — DOPAMINE-DEXTROSE 3.2-5 MG/ML-% IV SOLN
0.0000 ug/kg/min | INTRAVENOUS | Status: DC
  Filled 2014-11-19: qty 250

## 2014-11-19 MED ORDER — NITROGLYCERIN IN D5W 200-5 MCG/ML-% IV SOLN
2.0000 ug/min | INTRAVENOUS | Status: AC
  Administered 2014-11-20: 5 ug/min via INTRAVENOUS
  Filled 2014-11-19: qty 250

## 2014-11-19 MED ORDER — EPINEPHRINE HCL 1 MG/ML IJ SOLN
0.0000 ug/min | INTRAMUSCULAR | Status: DC
  Filled 2014-11-19: qty 4

## 2014-11-19 MED ORDER — MAGNESIUM SULFATE 50 % IJ SOLN
40.0000 meq | INTRAMUSCULAR | Status: DC
  Filled 2014-11-19: qty 10

## 2014-11-19 MED ORDER — DEXTROSE 5 % IV SOLN
30.0000 ug/min | INTRAVENOUS | Status: AC
  Administered 2014-11-20: 50 ug/min via INTRAVENOUS
  Filled 2014-11-19: qty 2

## 2014-11-19 MED ORDER — VANCOMYCIN HCL 10 G IV SOLR
1250.0000 mg | INTRAVENOUS | Status: AC
  Administered 2014-11-20: 1250 mg via INTRAVENOUS
  Filled 2014-11-19: qty 1250

## 2014-11-19 MED ORDER — DEXTROSE 5 % IV SOLN
1.5000 g | INTRAVENOUS | Status: AC
  Administered 2014-11-20: 1.5 g via INTRAVENOUS
  Filled 2014-11-19: qty 1.5

## 2014-11-19 MED ORDER — DEXTROSE 5 % IV SOLN
0.0000 ug/min | INTRAVENOUS | Status: AC
  Administered 2014-11-20: 1 ug/min via INTRAVENOUS
  Filled 2014-11-19: qty 4

## 2014-11-19 MED ORDER — POTASSIUM CHLORIDE 2 MEQ/ML IV SOLN
80.0000 meq | INTRAVENOUS | Status: DC
  Filled 2014-11-19: qty 40

## 2014-11-19 NOTE — Patient Instructions (Signed)
  Patient should continue taking all current medications without change through the day before surgery.  Patient should have nothing to eat or drink after midnight the night before surgery.  On the morning of surgery patient should take only Metoprolol and Protonix with a sip of water.

## 2014-11-19 NOTE — Anesthesia Preprocedure Evaluation (Addendum)
Anesthesia Evaluation  Patient identified by MRN, date of birth, ID band Patient awake    Reviewed: Allergy & Precautions, NPO status , Patient's Chart, lab work & pertinent test results, reviewed documented beta blocker date and time   Airway Mallampati: I  TM Distance: >3 FB Neck ROM: Full    Dental  (+) Edentulous Upper, Edentulous Lower, Dental Advisory Given   Pulmonary shortness of breath and at rest, COPDformer smoker,  breath sounds clear to auscultation        Cardiovascular hypertension, Pt. on home beta blockers + angina + CAD and + Peripheral Vascular Disease + Valvular Problems/Murmurs AS Rhythm:Regular Rate:Normal     Neuro/Psych    GI/Hepatic GERD-  ,  Endo/Other    Renal/GU      Musculoskeletal  (+) Arthritis -,   Abdominal   Peds  Hematology   Anesthesia Other Findings   Reproductive/Obstetrics                         Anesthesia Physical Anesthesia Plan  ASA: III  Anesthesia Plan: General   Post-op Pain Management:    Induction: Intravenous  Airway Management Planned: Oral ETT  Additional Equipment: Arterial line, CVP, 3D TEE, PA Cath and Ultrasound Guidance Line Placement  Intra-op Plan:   Post-operative Plan:   Informed Consent: I have reviewed the patients History and Physical, chart, labs and discussed the procedure including the risks, benefits and alternatives for the proposed anesthesia with the patient or authorized representative who has indicated his/her understanding and acceptance.     Plan Discussed with: CRNA and Anesthesiologist  Anesthesia Plan Comments:         Anesthesia Quick Evaluation

## 2014-11-19 NOTE — H&P (Signed)
[address]       [place] [address]             [phone]          CARDIOTHORACIC SURGERY HISTORY AND PHYSICAL EXAM  Referring Provider is Jerline Pain, MD PCP is [name], DO  Chief Complaint  Patient presents with  . Aortic Stenosis    HPI:  Patient is a 74 year old white male with history of aortic stenosis, severe COPD, hypertension, peripheral vascular disease, hyperlipidemia, and iron deficient anemia with GI bleeding from angiodysplasia in the past who has been referred to discuss treatment options for management of severe symptomatic aortic stenosis. The patient states that he has been told he has a heart murmur for most of his life. He has been followed by Dr. Marlou Porch for the last several years with known history of aortic stenosis. An echocardiogram performed 01/29/2012 revealed what was felt to be moderate aortic stenosis with normal left ventricular systolic function based upon peak velocity across the aortic valve measured 3.4 m/s corresponding to a mean transvalvular gradient estimated at 28 mmHg. This past December the patient underwent left knee arthroscopy for torn cartilage under general anesthesia. The patient tolerated the procedure well, but within a few weeks he began to experience progressive symptoms of shortness of breath. Transthoracic echocardiogram performed 04/06/2014 revealed the presence of severe aortic stenosis based upon peak velocity across the aortic valve that ranged between 3.5 and 3.8 m/s corresponding to peak-to-peak and mean transvalvular gradients estimated to be 60 and 34 mmHg, respectively. Aortic valve area was calculated to be 0.8 cm. Left ventricular systolic function remained preserved with ejection fraction estimated 55%. Shortly after that the patient was hospitalized on 04/16/2014 with acute hypoxic respiratory failure. He was initially treated using BiPAP and he recovered quickly. He was  diagnosed with presumed flare of COPD. Symptoms continued to improve with bronchodilators, antibiotics, and steroids. He was seen in follow-up shortly after hospital discharge by Dr. Marlou Porch who felt concerned that the patient's aortic stenosis might be contributing to his problems with dyspnea. The patient was referred for elective surgical consultation at that time, but he decided to postpone consultation until after he had been seen in follow-up by his pulmonologist, Dr. [name]. On 05/31/2014 the patient developed another acute exacerbation of resting shortness of breath and dry non-productive cough for which he was briefly hospitalized. He was again treated for presumed flare of COPD and discharged from the hospital. The patient was seen in follow-up by Dr. [name] on 06/04/2014 who felt concerned that the patient's symptoms of progressive shortness of breath with orthopnea and PND sounded concerning for likely congestive heart failure. The patient was referred for elective surgical consultation.  The patient is married and lives locally in [place] with his wife. He is originally from [name], but relocated to [place]. He retired in May 2015 after having previously spent his career working for a company that makes [employer]. Up until recently he reports no specific physical limitations, although he admits that he does not exercise on a regular basis and he lives a somewhat sedentary lifestyle. He has a long-standing history of tobacco abuse dating back more than 63 years. In retirement the patient reports smoking between 1-1/2 and 2 packs of cigarettes daily. He quit smoking completely in January of this year following his first hospitalization for shortness of breath. He states that since his most recent hospitalization 3 weeks ago he  has still not recovered to his previous baseline. He reports shortness of breath with mild-to-moderate exertion. He has to stop frequently to rest and  catch his breath when he is tending to chores around the house. Immediately prior to the time he was hospitalized in January and again in March, the patient was experiencing resting shortness of breath with PND and orthopnea. He denies any history of chest pain or chest tightness with exertion. He denies any palpitations, dizzy spells, or syncope. He reports a chronic dry nonproductive cough.  Patient returns for follow-up of severe symptomatic aortic stenosis with tentative plans to proceed with transcatheter aortic valve replacement via transapical approach tomorrow. He was originally seen in consultation on 06/21/2014. Since then he has been evaluated extensively by a multidisciplinary team of specialists. He is felt to be of poor candidate for conventional surgery because of numerous comorbid medical problems as well as severe calcification of the entire thoracic aorta. He underwent diagnostic cardiac catheterization by Dr. Marlou Porch on 06/28/2014 confirming the presence of severe aortic stenosis with mean transvalvular gradient measured 41.2 mmHg at catheterization. Aortic valve area was calculated 0.67 cm. Coronary arteriography revealed the presence of severe multivessel coronary artery disease with high-grade 90% stenosis of the mid right coronary artery. Pulmonary artery pressures were mildly elevated. He was scheduled for PCI and stenting of his right coronary artery, but this was delayed because of problems with GI bleeding. He underwent EGD and colonoscopy demonstrating multiple angiodysplasias requiring ablation. He eventually underwent PCI and stenting of the right coronary artery using a bare-metal stent on 10/19/2014. He has completed a 1 month course of dual antiplatelet therapy and his hemoglobin has remained stable. He now returns with plans to proceed with definitive management of severe symptomatic aortic stenosis in the operating room tomorrow.  The patient reports no new problems or  complaints over the last few weeks. He continues to experience exertional shortness of breath and fatigue. He has not been having any chest pain or chest tightness. His bowel function is regular. His stool remains dark because he has been taking iron supplements. The remainder of his review of systems is unchanged from previously.             Past Medical History  Diagnosis Date  . Polyp of nasal cavity   . Rosacea   . Benign neoplasm of colon   . Tobacco abuse   . Psychosexual dysfunction with inhibited sexual excitement   . Hypertension   . Hyperlipidemia   . GERD (gastroesophageal reflux disease)   . GI bleed 2010    4 units PRBCs  . Adenomatous colon polyp   . Peripheral vascular disease   . Irregular heartbeat   . Shingles   . Carotid artery occlusion   . Angiodysplasia of intestine with hemorrhage   . Pleural plaque with presence of asbestos 03/27/2013    Followed in Pulmonary clinic/ [facility]/ [place] - F/u CT 09/08/2013 1. Stable extensive calcified pleural plaque formation consistent with asbestos exposure. 2. Multiple pulmonary nodules are unchanged from the CT of 6 months ago. Given risk factors for lung cancer, continued follow up is recommended with chest CT in 6 months> done 04/20/14 no change >repeat in 12 m in tickle file     . GERD 11/30/2008    Qualifier: Diagnosis of  By: Marijean Niemann CMA, Danielle    . PVD (peripheral vascular disease) 10/18/2012  . Carotid artery stenosis 04/22/2012  . Coronary artery disease   . Heart murmur   .  Emphysema/COPD   . COPD GOLD III with min reversibilty  08/11/2006    Followed in Pulmonary clinic/ [facility]/ [place] - PFT's 04/28/2013  FEV1 0.88 (40%) with ratio 44 and 14% better p B2 dlco 45 corrects to 83 - Trial of breo 04/28/2013 > improved symptoms  06/09/2013  - spirometry 06/04/2014 FEV1  0.76 (29%) ratio 45      . Anemia   . Iron deficiency anemia 01/25/2009    Qualifier: Diagnosis of  By: Henrene Pastor MD, Docia Chuck   . History of  blood transfusion "couple times"    "related to bleeding in colon and esophagus"  . Arthritis     "left shoulder" (10/19/2014)  . Anxiety     Past Surgical History  Procedure Laterality Date  . Iliac artery stent Left 2005    CIA  . Esophagogastroduodenoscopy  2012    normal  . Colonoscopy  July 2015    Dr. Henrene Pastor  . Tonsillectomy    . Knee arthroscopy with medial menisectomy Left 03/08/2014    Procedure: LEFT KNEE SCOPE WITH MEDIAL MENISECTOMY AND CHONDROPLASTY;  Surgeon: Ninetta Lights, MD;  Location: [facility];  Service: Orthopedics;  Laterality: Left;  . Left and right heart catheterization with coronary angiogram N/A 06/28/2014    Procedure: LEFT AND RIGHT HEART CATHETERIZATION WITH CORONARY ANGIOGRAM;  Surgeon: Jerline Pain, MD;  Location: [facility] CATH LAB;  Service: Cardiovascular;  Laterality: N/A;  . Esophagogastroduodenoscopy N/A 08/17/2014    Procedure: ESOPHAGOGASTRODUODENOSCOPY (EGD);  Surgeon: Jerene Bears, MD;  Location: [facility] ENDOSCOPY;  Service: Endoscopy;  Laterality: N/A;  . Colonoscopy N/A 08/17/2014    Procedure: COLONOSCOPY;  Surgeon: Jerene Bears, MD;  Location[facility] ENDOSCOPY;  Service: Endoscopy;  Laterality: N/A;  . Cardiac catheterization  2001; 06/28/2014  . Cardiac catheterization N/A 10/19/2014    Procedure: Coronary Stent Intervention;  Surgeon: Burnell Blanks, MD;  Location: [facility] CV LAB;  Service: Cardiovascular;  Laterality: N/A;  BMS Mid RCA    Family History  Problem Relation Age of Onset  . Lung disease Mother     pulm fibrosis  . Uterine cancer    . Colitis Father   . Colon cancer Neg Hx   . Heart disease Brother   . Hypertension Brother   . Hyperlipidemia Brother   . Heart disease Daughter     cad  . Hypertension Son     Social History Social History  Substance Use Topics  . Smoking status: Former Smoker -- 1.00 packs/day for 60 years    Types: Cigarettes, E-cigarettes    Quit date: 04/17/2014  . Smokeless tobacco: Never Used  .  Alcohol Use: 8.4 oz/week    14 Glasses of wine per week    Prior to Admission medications   Medication Sig Start Date End Date Taking? Authorizing Provider  amLODipine (NORVASC) 10 MG tablet Take 1 tablet (10 mg total) by mouth daily. 10/03/14  Yes Rosalita Chessman, DO  Ascorbic Acid (VITAMIN C) 250 MG tablet Take 500 mg by mouth daily.    Yes Historical Provider, MD  aspirin 81 MG chewable tablet Chew 1 tablet (81 mg total) by mouth daily. 10/20/14  Yes Brett Canales, PA-C  atorvastatin (LIPITOR) 40 MG tablet TAKE ONE TABLET BY MOUTH ONCE DAILY Patient taking differently: TAKE 40 MG BY MOUTH ONCE DAILY 10/17/14  Yes Yvonne R Lowne, DO  BREO ELLIPTA 100-25 MCG/INH AEPB INHALE ONE PUFF INTO LUNGS ONCE DAILY IN THE MORNING 09/26/14  Yes Tanda Rockers, MD  clopidogrel (PLAVIX) 75 MG tablet Take 1 tablet (75 mg total) by mouth daily with breakfast. Patient not taking: Reported on 11/19/2014 10/20/14  Yes Brett Canales, PA-C  docusate sodium (COLACE) 100 MG capsule Take 100 mg by mouth 2 (two) times daily.    Yes Historical Provider, MD  fenofibrate 160 MG tablet Take 1 tablet (160 mg total) by mouth daily. 11/13/14  Yes Rosalita Chessman, DO  Ferrous Sulfate (IRON) 325 (65 FE) MG TABS Take 1 tablet by mouth 2 (two) times daily. Patient taking differently: Take 325 mg by mouth 2 (two) times daily.  07/25/10  Yes Yvonne R Lowne, DO  Ipratropium-Albuterol (COMBIVENT RESPIMAT) 20-100 MCG/ACT AERS respimat Inhale 1 puff into the lungs 4 (four) times daily as needed for wheezing. 05/10/14  Yes Alferd Apa Lowne, DO  metoprolol tartrate (LOPRESSOR) 25 MG tablet Take 0.5 tablets (12.5 mg total) by mouth 2 (two) times daily. 08/18/14  Yes Charlynne Cousins, MD  Multiple Vitamins-Minerals (CENTRUM SILVER PO) Take 1 tablet by mouth daily.    Yes Historical Provider, MD  pantoprazole (PROTONIX) 40 MG tablet TAKE ONE TABLET BY MOUTH ONCE DAILY Patient taking differently: Take 40 mg by mouth daily.  09/04/14  Yes Yvonne R Lowne,  DO  valsartan-hydrochlorothiazide (DIOVAN HCT) 160-12.5 MG per tablet Take 1 tablet by mouth daily. 09/04/14  Yes Yvonne R Lowne, DO  VOLTAREN 1 % GEL Apply 2 g topically 4 (four) times daily as needed (for arthritis).  05/12/14  Yes Historical Provider, MD    No Known Allergies    Diagnostic Tests:  Transthoracic Echocardiography  Patient:  Dupree, Givler MR #:    [id number] Study Date: 04/06/2014 Gender:   M Age:    1 Height:   160 cm Weight:   59.9 kg BSA:    1.64 m^2 Pt. Status: Room:  Arvin Collard, M.D. REFERRING  Candee Furbish, M.D. SONOGRAPHER Wyatt Mage, RDCS ATTENDING  Lyman Bishop MD PERFORMING  [facility], Outpatient  cc:  ------------------------------------------------------------------- LV EF: 55% -  60%  ------------------------------------------------------------------- Indications:   Murmur (R01.1).  ------------------------------------------------------------------- History:  PMH:  Chronic obstructive pulmonary disease. Risk factors: PVD. Carotid artery stenosis. GERD. GI bleed. H/o Barrett&'s esophagus. Rosacea. Anemia. Current tobacco use. Hypertension. Dyslipidemia.  ------------------------------------------------------------------- Study Conclusions  - Left ventricle: The cavity size was is borderline dilated. Wall thickness was normal. Systolic function was normal. The estimated ejection fraction was in the range of 55% to 60%. Wall motion was normal; there were no regional wall motion abnormalities. Doppler parameters are consistent with abnormal left ventricular relaxation (grade 1 diastolic dysfunction). The E/e&' ratio is between 8-15, suggesting indeterminate LV filling pressure. - Aortic valve: Heavily calcified aortic valve leaflets. Peak and mean gradients of 60 and 34 mmHg, respectively. Based on an LVOT diameter of 2.0 cm, the calculated AVA is 0.8 cm2,  consistent with severe aortic stenosis. There is mild regurgitation. Valve area (VTI): 0.74 cm^2. Valve area (Vmean): 0.68 cm^2. - Mitral valve: Calcified annulus. Mildly thickened leaflets . There was trivial regurgitation. - Left atrium: Severely dilated at 49 ml/m2.  Impressions:  - LVEF 55-60%, normal wall thickness, borderline dilated LV, normal wall motion, diastolic dysfunction, likely elevated LV filling pressure, there is severe aortic stenosis, peak and mean gradients of 60 mmHG and 35 mmHg, respectively. AVA is around 0.8 cm2. Mild AI, severely dilated LA  Transthoracic echocardiography. M-mode, complete 2D, spectral Doppler, and color Doppler. Birthdate: Patient birthdate:  04-Feb-1941. Age: Patient is 74 yr old. Sex: Gender: male. BMI: 23.4 kg/m^2. Blood pressure:   130/50 Patient status: Outpatient. Study date: Study date: 04/06/2014. Study time: 08:15 AM. Location: [facility]  -------------------------------------------------------------------  ------------------------------------------------------------------- Left ventricle: The cavity size was is borderline dilated. Wall thickness was normal. Systolic function was normal. The estimated ejection fraction was in the range of 55% to 60%. Wall motion was normal; there were no regional wall motion abnormalities. Doppler parameters are consistent with abnormal left ventricular relaxation (grade 1 diastolic dysfunction). The E/e&' ratio is between 8-15, suggesting indeterminate LV filling pressure.  ------------------------------------------------------------------- Aortic valve: Heavily calcified aortic valve leaflets. Peak and mean gradients of 60 and 34 mmHg, respectively. Based on an LVOT diameter of 2.0 cm, the calculated AVA is 0.8 cm2, consistent with severe aortic stenosis. There is mild regurgitation. Doppler: VTI ratio of LVOT to aortic valve: 0.24. Valve area (VTI):  0.74 cm^2. Indexed valve area (VTI): 0.45 cm^2/m^2. Mean velocity ratio of LVOT to aortic valve: 0.22. Valve area (Vmean): 0.68 cm^2. Indexed valve area (Vmean): 0.41 cm^2/m^2.  Mean gradient (S): 29 mm Hg.  ------------------------------------------------------------------- Aorta: Aortic root: The aortic root was normal in size. Ascending aorta: The ascending aorta was normal in size.  ------------------------------------------------------------------- Mitral valve:  Calcified annulus. Mildly thickened leaflets . Doppler: There was trivial regurgitation.  Peak gradient (D): 3 mm Hg.  ------------------------------------------------------------------- Left atrium: Severely dilated at 49 ml/m2.  ------------------------------------------------------------------- Atrial septum: No defect or patent foramen ovale was identified.  ------------------------------------------------------------------- Right ventricle: The cavity size was normal. Wall thickness was normal. Systolic function was normal.  ------------------------------------------------------------------- Pulmonic valve:  The valve appears to be grossly normal. Doppler: There was no significant regurgitation.  ------------------------------------------------------------------- Tricuspid valve:  Doppler: There was no significant regurgitation.  ------------------------------------------------------------------- Pulmonary artery:  The main pulmonary artery was normal-sized.  ------------------------------------------------------------------- Right atrium: The atrium was normal in size.  ------------------------------------------------------------------- Pericardium: There was no pericardial effusion.  ------------------------------------------------------------------- Systemic veins: Inferior vena cava: The vessel was normal in size. The respirophasic diameter changes were in the normal range (>=  50%), consistent with normal central venous pressure.  ------------------------------------------------------------------- Measurements  Left ventricle              Value     Reference LV ID, ED, PLAX chordal      (H)   55  mm    43 - 52 LV ID, ES, PLAX chordal      (H)   41  mm    23 - 38 LV fx shortening, PLAX chordal  (L)   25  %    >=29 LV PW thickness, ED            8   mm    --------- IVS/LV PW ratio, ED            1       <=1.3 Stroke volume, 2D             62  ml    --------- Stroke volume/bsa, 2D           38  ml/m^2  --------- LV e&', lateral              7.46 cm/s   --------- LV E/e&', lateral             12.23     --------- LV e&', medial               5.84 cm/s   --------- LV  E/e&', medial              15.62     --------- LV e&', average              6.65 cm/s   --------- LV E/e&', average             13.71     ---------  Ventricular septum            Value     Reference IVS thickness, ED             8   mm    ---------  LVOT                   Value     Reference LVOT ID, S                20  mm    --------- LVOT area                 3.14 cm^2   --------- LVOT mean velocity, S           54.1 cm/s   --------- LVOT VTI, S                19.9 cm    ---------  Aortic valve               Value     Reference Aortic valve mean velocity, S       250  cm/s   --------- Aortic valve VTI, S            84.5 cm    --------- Aortic mean gradient, S          29  mm Hg  --------- VTI ratio, LVOT/AV            0.24      --------- Aortic  valve area, VTI          0.74 cm^2   --------- Aortic valve area/bsa, VTI        0.45 cm^2/m^2 --------- Velocity ratio, mean, LVOT/AV       0.22      --------- Aortic valve area, mean velocity     0.68 cm^2   --------- Aortic valve area/bsa, mean        0.41 cm^2/m^2 --------- velocity Aortic regurg pressure half-time     316  ms    ---------  Aorta                   Value     Reference Aortic root ID, ED            32  mm    ---------  Left atrium                Value     Reference LA ID, A-P, ES              41  mm    --------- LA ID/bsa, A-P          (H)   2.5  cm/m^2  <=2.2 LA volume, S               78.5 ml    --------- LA volume/bsa, S             47.9 ml/m^2  --------- LA volume, ES, 1-p A4C          63.9 ml    --------- LA volume/bsa, ES, 1-p  A4C        39  ml/m^2  --------- LA volume, ES, 1-p A2C          87  ml    --------- LA volume/bsa, ES, 1-p A2C        53.1 ml/m^2  ---------  Mitral valve               Value     Reference Mitral E-wave peak velocity        91.2 cm/s   --------- Mitral deceleration time     (L)   102  ms    150 - 230 Mitral peak gradient, D          3   mm Hg  --------- Mitral E/A ratio, peak          0.7      ---------  Systemic veins              Value     Reference Estimated CVP               3   mm Hg  ---------  Right ventricle              Value     Reference RV e&', lateral              14.1 cm/s   ---------  Legend: (L) and (H) mark values outside specified reference  range.  ------------------------------------------------------------------- Prepared and Electronically Authenticated by  Lyman Bishop MD 2016-01-15T10:38:01   CARDIAC CATHETERIZATION  PROCEDURE: Right and left heart catheterization with selective coronary angiography, left ventriculogram, cardiac output, selective oxygen saturations, right subclavian angiogram.  INDICATIONS: 74 year old with COPD, severe aortic stenosis, severe peripheral vascular disease, occluded right iliac, stented left iliac, moderate carotid bilaterally, left subclavian stenosis, porcelain aorta here for preoperative angiogram, aortic valve evaluation, right heart catheterization.  The risks, benefits, and details of the procedure were explained to the patient, including stroke, heart attack, death, renal impairment, arterial damage, bleeding. The patient verbalized understanding and wanted to proceed. Informed written consent was obtained.  PROCEDURE TECHNIQUE: A brachial IV was placed in the antecubital position. Lidocaine was administered surrounding brachial IV as well as radial artery site. A guidewire was placed through the brachial IV without difficulty. A 5 French short brachial sheath was then placed in the vein without difficulty under fluoroscopy. A 5 French balloontipped PA catheter was then inserted without difficulty and traversed through right-sided heart chambers to the wedge position. Multiple pressures obtained. Oxygen saturation in the pulmonary artery obtained. Catheter was then removed. Focus was then placed on the radial artery site where using the modified Seldinger technique a 5 French glide sheath was placed in the radial artery without difficulty. Verapamil 3 mg was administered. J-wire successfully traversed up to the subclavian origin and a versa core wire was then utilized to traverse to the aortic valve position. A right subclavian angiogram was performed. Right Judkins 4 catheter was  carefully positioned using fluoroscopy. Right coronary angiography was done using a Judkins R4 catheter. Multiple views with hand injection of Omnipaque were obtained. This catheter was then utilized to traverse the aortic valve. After unsuccessful attempt with J-wire, a straight wire was then utilized and successfully cross the aortic valve. Pressures were obtained prior to crossing in the aortic position and then post crossing in the LV position. A hand injected left ventriculogram was performed under fluoroscopy. Pullback was then obtained. Careful measurements of transvalvular pressures were performed. This  catheter was then exchanged over the J-wire for a Judkins left 3.5 catheter which successfully cannulated the left main artery. Multiple views with hand injection were obtained under fluoroscopy of the left system. This catheter was then removed over the wire and she's were then removed utilizing Terumo T band and pressure. Findings were discussed with he and family.   CONTRAST: Total of 90 ml.  COMPLICATIONS: None.   HEMODYNAMICS:   Right atrium (RA): 12/10/8 mmHg Right ventricle (RV): 41/3/10 mmHg Pulmonary artery (PA): 39/17/26 mmHg Pulmonary capillary wedge pressure (PCWP): 17/14/14 mmHg  Cardiac output: Fick 4.87 liters/min Cardiac index: Fick 2.99  PA saturation: 67% FA saturation: 96%   Aortic pressure: 148/47 mmHg LV pressure: 379 mmHg systolic; LVEDP 17 mmHg.   Peak to peak gradient between the left ventricle and aorta was 26mmHg, mean 41.57mmHg, Calculated valve area 0.67cm squared.   ANGIOGRAPHIC DATA:   Left main: Calcified vessel branching into circumflex as well as LAD, no significant stenosis.  Left anterior descending (LAD): Densely calcified vessel with severe proximal/mid LAD stenosis surrounding major septal branch of approximately 90% at its highest, continuing to large diagonal branch. Disease length is approximately 15-20 mm. Major diagonal branch  also has 70% proximal stenosis.  Circumflex artery (CIRC): In the RAO caudal view there appears to be ostial/proximal circumflex disease, perhaps 90% stenosis. This vessel is also densely calcified and is fairly small in caliber distally. There are 2 small obtuse marginal branches.  Right coronary artery (RCA): This vessel is also densely calcified. There is a significant 90% stenosis in the mid RCA. There is also 50% stenosis in the proximal RCA at the first major bend of the vessel. This vessel is dominant giving rise to the posterior descending artery. It is fairly large in caliber.  LEFT VENTRICULOGRAM: Left ventricular angiogram was done in the 30 RAO projection with hand injection and revealed normal left ventricular wall motion and systolic function with an estimated ejection fraction of 65%.   IMPRESSIONS:  1. Severe triple-vessel coronary artery disease encompassing the proximal/mid LAD, ostial/proximal circumflex, mid RCA. All vessels are densely calcified. 2. Normal left ventricular systolic function. LVEDP 17 mmHg. Ejection fraction 65%. 3. Mildly elevated right-sided heart pressures. (Pulmonary artery systolic 39 mmHg-history of COPD) 4. Normal cardiac output. 5. Severe aortic stenosis-calculated valve area 0.67 cm with a mean gradient of 41 mmHg, peak to peak gradient of 44 mmHg. 6. Severe peripheral vascular disease-densely calcified carotid arteries noted on fluoroscopy as well as calcified right subclavian artery as well as brachiocephalic artery. There is a napkin ring, ridge of calcium in the mid brachiocephalic that necessitated versa core wire. The ascending and proximal distal aorta demonstrates severe calcification/atherosclerosis.  RECOMMENDATION: We will forward results to Dr. Roxy Manns and Dr. Julianne Handler. Obviously his coronary anatomy as well as porcelain aorta and severe aortic stenosis make for a challenging situation. We will discuss all options.  Candee Furbish,  MD    PERCUTANEOUS CORONARY INTERVENTION  Procedures    Coronary Stent Intervention    PACS Images    Show images for Cardiac catheterization     Link to Procedure Log    Procedure Log      Indications    Unstable angina [I20.0 (ICD-10-CM)]    Technique and Indications    Indication: 74 yo male with severe PAD, severe AS waiting on TAVR, severe mid RCA stenosis, moderate LAD stenosis. Planned PCI mid RCA with rotational atherectomy today.   Procedure: The risks, benefits, complications, treatment options, and  expected outcomes were discussed with the patient. The patient and/or family concurred with the proposed plan, giving informed consent. The patient was brought to the cath lab after IV hydration was begun and oral premedication was given. The patient was further sedated with Versed and Fentanyl. The right groin was prepped and draped in the usual manner. Using the modified Seldinger access technique, a 6 French sheath was placed in the right femoral artery. Navigating a wire through the severely diseased right iliac artery was difficult but I was able to use a Glide wire to get into the aorta. A long 7 Fr sheath was then placed in there right femoral artery. I then placed a 6 Fr sheath in the right femoral vein. A temporary pacemaker was placed into the RV. Angiomax bolus and drip given. JR 4 guide placed in the RCA and used to perform selective coronary angiography. When the ACT was over 200, I advanced a RotaFloppy wire down the RCA. I then used a 1.5 mm Rotaburr and made two passed down the mid RCA. The burr was changed for a 1.75 mm burr and two passes were made. A 2.5 x 12 mm balloon was used to pre-dilate the stenosis in the mid RCA. A 3.5 x 16 mm bare metal stent was deployed in the mid RCA. A 3.75 x 12 mm Arabi balloon was used to post-dilate the stent. The stenosis was taken from 95% down to 0%. TIMI-3 flow pre/post stenting.   EBL: 20cc   There  were no immediate complications. The patient was taken to the recovery area in stable condition.    Conclusion    5. Prox RCA lesion, 40% stenosed. 6. Mid RCA lesion, 95% stenosed. There is a 0% residual stenosis post intervention. 7. A bare metal stent was placed.  1. Severe mid RCA stenosis now s/p successful rotablator atherectomy of the mid RCA with bare metal stent placement.    Recommendations: Will continue ASA and Plavix for one month. Probable d/c in am. He has completed his workup for TAVR. Will need to have f/u in our valve clinic in 1-2 weeks and will then plan a date for TAVR. Hopefully he will not have further GI bleeding on Plavix. (I waited to load until today).      Coronary Findings    Dominance: Right   Right Coronary Artery   . Prox RCA lesion, 40% stenosed. calcified discrete .   Marland Kitchen Mid RCA lesion, 95% stenosed. calcified discrete .   Marland Kitchen PCI: The pre-interventional distal flow is normal (TIMI 3). Pre-stent angioplasty was performed. A bare metal stent was placed. The strut is apposed. Post-stent angioplasty was performed. The post-interventional distal flow is normal (TIMI 3). The intervention was successful. No complications occurred at this lesion. Rotational atherectomy used to pre-treat lesion.  . There is no residual stenosis post intervention.        Coronary Diagrams    Diagnostic Diagram           Post-Intervention Diagram           Cardiac TAVR CT  TECHNIQUE: The patient was scanned on a Philips 256 scanner. A 120 kV retrospective scan was triggered in the descending thoracic aorta at 111 HU's. Gantry rotation speed was 270 msecs and collimation was .9 mm. No beta blockade or nitro were given. The 3D data set was reconstructed in 5% intervals of the R-R cycle. Systolic and diastolic phases were analyzed on a dedicated work station using MPR, MIP and  VRT modes. The patient received 80 cc of  contrast.  FINDINGS: Aortic Valve: Trileaflet and heavily calcified. Mild annular calcification near base of left cusp. No significant MAC.  Aorta: Porcelin aortic root with severe calcification through out the arch and descending thoracic aorta as well. Bilateral proximal subclavian artery stenosis  Sinotubular Junction: 2.2 cm  Ascending Thoracic Aorta: 2.8 cm  Descending Thoracic Aorta: 2.2 cm  Sinus of Valsalva Measurements:  Non-coronary: 29 mm  Right -coronary: 26 mm  Left -coronary: 28.5 mm  Coronary Artery Height above Annulus:  Left Main: 15.4 mm  Right Coronary: 14.8 mm  Virtual Basal Annulus Measurements:  Maximum/Minimum Diameter: 26.7 mm x 22.4 mm  Perimeter: 77 mm  Area: 465 mm2  Coronary Arteries: Sufficient height above annulus for delivery heavily calcified right dominant  Optimum Fluoroscopic Angle for Delivery: LAO 4 degress Cranial 1 degree  IMPRESSION: 1) Aortic annulus area of 465 mm2 suitable for 26 mm Sapien 3 valve  2) 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  On: 07/09/2014 17:04      Study Result     EXAM: OVER-READ INTERPRETATION CT CHEST  The following report is an over-read performed by radiologist Dr. Julian Hy of [facility] on 07/09/2014. This over-read does not include interpretation of cardiac or coronary anatomy or pathology. The coronary CTA interpretation by the cardiologist is attached.  COMPARISON: CT chest dated 04/20/2014  FINDINGS: No mediastinal lymphadenopathy.  No suspicious pulmonary nodules. Known right upper lobe pulmonary nodules are excluded from the current gated study.  Emphysematous changes.  Scarring/atelectasis in the left upper lobe. Calcified pleural plaques.  No focal osseous lesions.  IMPRESSION: No significant extracardiac findings.  Electronically Signed: By: Julian Hy M.D. On: 07/09/2014 11:17     CT ANGIOGRAPHY CHEST, ABDOMEN AND PELVIS  TECHNIQUE: Multidetector CT imaging through the chest, abdomen and pelvis was performed using the standard protocol during bolus administration of intravenous contrast. Multiplanar reconstructed images and MIPs were obtained and reviewed to evaluate the vascular anatomy.  CONTRAST: 25mL OMNIPAQUE IOHEXOL 350 MG/ML SOLN  COMPARISON: Chest CT 09/08/2013.  FINDINGS: CTA CHEST FINDINGS  Mediastinum/Lymph Nodes: Heart size is borderline enlarged. There is no significant pericardial fluid, thickening or pericardial calcification. There is atherosclerosis of the thoracic aorta, the great vessels of the mediastinum and the coronary arteries, including calcified atherosclerotic plaque in the left main, left anterior descending, left circumflex and right coronary arteries. In addition, there is high-grade stenosis at the origin of the left subclavian artery, and in the proximal right subclavian artery. Severe thickening calcifications of the aortic valve. No pathologically enlarged mediastinal or hilar lymph nodes. Esophagus is unremarkable in appearance. No axillary lymphadenopathy.  Lungs/Pleura: Extensive calcified pleural plaques are noted throughout the thorax bilaterally, indicative of asbestos related pleural disease. Multiple pulmonary nodules are noted in the lungs bilaterally, largest which measures 8 x 4 mm in the apex of the right upper lobe (image 7 of series 403). These nodules are generally similar in size, number and distribution to remote prior study 03/10/2013, and are considered likely benign. No acute consolidative airspace disease. No pleural effusions. No definite fibrotic  changes.  Musculoskeletal/Soft Tissues: There are no aggressive appearing lytic or blastic lesions noted in the visualized portions of the skeleton.  CTA ABDOMEN AND PELVIS  FINDINGS  Hepatobiliary: 8 mm low attenuation lesion in the medial aspect of segment 6 of the liver is too small to characterize, but is statistically likely to represent a small cyst. No other suspicious appearing hepatic lesions are noted. No intra or extrahepatic biliary ductal dilatation. Tiny calcified gallstone lying dependently in the gallbladder. No current findings to suggest an acute cholecystitis at this time.  Pancreas: Unremarkable.  Spleen: Unremarkable.  Adrenals/Urinary Tract: Sub cm low-attenuation lesion in the upper pole of the right kidney is too small to definitively characterize, but is favored to represent a tiny cyst. Left kidney is normal in appearance. Bilateral adrenal glands are normal in appearance. No hydroureteronephrosis. Urinary bladder is unremarkable.  Stomach/Bowel: The appearance of the stomach is normal. No pathologic dilatation of small bowel or colon. A few scattered colonic diverticulae are noted, without surrounding inflammatory changes to suggest an acute diverticulitis at this time. Normal appendix.  Vascular/Lymphatic: Vascular findings and measurements pertinent to potential TAVR procedure, as detailed below. Severe atherosclerosis. 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 Probable stent in the left common iliac artery distally.  Reproductive: Prostate gland and seminal vesicles are unremarkable in appearance. Bilateral vasectomy clips.  Other: No significant volume of ascites. No pneumoperitoneum.  Musculoskeletal: Small well-defined sclerotic lesion in the left ilium adjacent  to the sacroiliac joint, favored to represent a tiny bone island. There are no aggressive appearing lytic or blastic lesions noted in the visualized portions of the skeleton.  VASCULAR MEASUREMENTS PERTINENT TO TAVR:  AORTA:  Minimal Aortic Diameter - 10 x 9 mm  Severity of Aortic Calcification - severe  RIGHT PELVIS:  Right Common Iliac Artery -  Minimal Diameter - 6.8 x 3.6 mm  Tortuosity - mild  Calcification - severe  Right External Iliac Artery -  Minimal Diameter - 3.5 x 2.7 mm  Tortuosity - moderate  Calcification - severe  Right Common Femoral Artery -  Minimal Diameter - 5.3 x 3.2 mm  Tortuosity - mild  Calcification - severe  LEFT PELVIS:  Left Common Iliac Artery -  Minimal Diameter - 3.9 x 3.7 mm  Tortuosity - mild  Calcification - severe  Left External Iliac Artery -  Minimal Diameter - 4.5 x 3.1 mm  Tortuosity - moderate  Calcification - severe  Left Common Femoral Artery -  Minimal Diameter - 2.8 x 2.1 mm  Tortuosity - mild  Calcification - severe  Review of the MIP images confirms the above findings.  IMPRESSION: 1. Severe atherosclerosis, particularly throughout the pelvic arterial access vessels, as detailed above. This patient does not have suitable pelvic arterial access for TAVR procedure. 2. 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 3. Asbestos related pleural disease. No fibrotic changes in the lung parenchyma to suggest asbestosis at this time. 4. Multiple small pulmonary nodules scattered throughout the lungs bilaterally, similar in size, number and distribution to prior examinations dating back to 03/10/2013, strongly favored to be benign. Attention on one additional noncontrast chest CT in 12 months is suggested to confirm greater than 24 months of stability. 5. Cholelithiasis without evidence of acute cholecystitis at  this time. 6. Mild colonic diverticulosis without evidence of acute diverticulitis at this time. 7. Additional incidental findings, as above.   Electronically Signed  By: Vinnie Langton M.D.  On:  07/09/2014 15:24    Pulmonary Function Tests  BaselinePost-bronchodilator  FVC2.00 L (65% predicted)FVC2.17 L (71% predicted) FEV10.88 L (40% predicted)FEV11.02 L (46% predicted) FEF25-750.32 L (20% predicted)FEF25-750.43 L (26% predicted)  TLC5.80 L (104% predicted) RV3.40 L (159% predicted) DLCO45% predicted   STS Risk Calculator  ProcedureAVR + CABG  Risk of Mortality7.0% Morbidity or Mortality40.0% Prolonged LOS23.4% Short LOS15.8% Permanent Stroke3.9% Prolonged Vent Support28.6% DSW Infection0.8% Renal Failure11.1% Reoperation14.5%   Impression:  Patient has stage D severe symptomatic aortic stenosis and multivessel coronary artery disease. He presents with gradual progression of symptoms of exertional shortness of breath and fatigue consistent with chronic diastolic congestive heart failure, [place] Heart Association functional class III. Transthoracic echocardiogram and diagnostic cardiac catheterization confirmed the presence of severe aortic stenosis with preserved left ventricular systolic function. The patient also has severe multivessel coronary artery disease including significant stenosis of  the left anterior descending coronary artery and high-grade stenosis of the right coronary artery for which he recently underwent PCI and stenting. Risks associated with conventional surgical aortic valve replacement and coronary artery bypass grafting would be very high because of the patient's numerous comorbid medical problems including severe COPD. In addition, he has extensive calcification of the entire ascending thoracic aorta which would make open surgery extremely risky. As a result, I would not consider this patient a candidate for conventional open surgical aortic valve replacement with or without concomitant coronary artery bypass grafting. Unfortunately, the patient also has severe peripheral vascular disease and he does not have adequate pelvic vascular access for a transfemoral approach for TAVR. 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 Under the circumstances transcatheter aortic valve replacement via transapical approach seems the only reasonable option.    Plan:  The patient and his wife were again counseled at length regarding treatment alternatives for management of severe symptomatic aortic stenosis. Alternative approaches such as conventional aortic valve replacement, transcatheter aortic valve replacement, and palliative medical therapy were compared and contrasted at length. The risks associated with conventional surgical aortic valve replacement were been discussed in detail, as were expectations for post-operative convalescence. Long-term prognosis with medical therapy was discussed. This discussion was placed in the context of the patient's own specific clinical presentation and past medical history. All of their questions been addressed. The patient desires to proceed with TAVR tomorrow as previously planned.  Following the decision to proceed with transcatheter aortic valve replacement, a discussion has been held  regarding what types of management strategies would be attempted intraoperatively in the event of life-threatening complications, including whether or not the patient would be considered a candidate for the use of cardiopulmonary bypass and/or conversion to open sternotomy for attempted surgical intervention. The patient has been advised of a variety of complications that might develop including but not limited to risks of death, stroke, paravalvular leak, aortic dissection or other major vascular complications, aortic annulus rupture, device embolization, cardiac rupture or perforation, mitral regurgitation, acute myocardial infarction, arrhythmia, heart block or bradycardia requiring permanent pacemaker placement, congestive heart failure, respiratory failure, renal failure, pneumonia, infection, other late complications related to structural valve deterioration or migration, or other complications that might ultimately cause a temporary or permanent loss of functional independence or other long term morbidity. The patient provides full informed consent for the procedure as described and all questions were answered.      Valentina Gu. Roxy Manns, MD 11/19/2014 12:29 PM

## 2014-11-19 NOTE — Progress Notes (Signed)
[address]       [place] [address]             [phone]     CARDIOTHORACIC SURGERY OFFICE NOTE  Referring Provider is Jerline Pain, MD PCP is [name], DO   HPI:  Patient returns for follow-up of severe symptomatic aortic stenosis with tentative plans to proceed with transcatheter aortic valve replacement via transapical approach tomorrow.  He was originally seen in consultation on 06/21/2014.  Since then he has been evaluated extensively by a multidisciplinary team of specialists.  He is felt to be of poor candidate for conventional surgery because of numerous comorbid medical problems as well as severe calcification of the entire thoracic aorta.  He underwent diagnostic cardiac catheterization by Dr. Marlou Porch on 06/28/2014 confirming the presence of severe aortic stenosis with mean transvalvular gradient measured 41.2 mmHg at catheterization. Aortic valve area was calculated 0.67 cm. Coronary arteriography revealed the presence of severe multivessel coronary artery disease with high-grade 90% stenosis of the mid right coronary artery.  Pulmonary artery pressures were mildly elevated.  He was scheduled for PCI and stenting of his right coronary artery, but this was delayed because of problems with GI bleeding.  He underwent EGD and colonoscopy demonstrating multiple angiodysplasias requiring ablation. He eventually underwent PCI and stenting of the right coronary artery using a bare-metal stent on 10/19/2014. He has completed a 1 month course of dual antiplatelet therapy and his hemoglobin has remained stable. He now returns with plans to proceed with definitive management of severe symptomatic aortic stenosis in the operating room tomorrow.  The patient reports no new problems or complaints over the last few weeks. He continues to experience exertional shortness of breath and fatigue.  He has not been having any chest pain or chest tightness. His bowel function  is regular. His stool remains dark because he has been taking iron supplements. The remainder of his review of systems is unchanged from previously.   Current Outpatient Prescriptions  Medication Sig Dispense Refill  . amLODipine (NORVASC) 10 MG tablet Take 1 tablet (10 mg total) by mouth daily. 90 tablet 1  . Ascorbic Acid (VITAMIN C) 250 MG tablet Take 500 mg by mouth daily.     Marland Kitchen aspirin 81 MG chewable tablet Chew 1 tablet (81 mg total) by mouth daily.    Marland Kitchen atorvastatin (LIPITOR) 40 MG tablet TAKE ONE TABLET BY MOUTH ONCE DAILY (Patient taking differently: TAKE 40 MG BY MOUTH ONCE DAILY) 90 tablet 0  . BREO ELLIPTA 100-25 MCG/INH AEPB INHALE ONE PUFF INTO LUNGS ONCE DAILY IN THE MORNING 60 each 5  . docusate sodium (COLACE) 100 MG capsule Take 100 mg by mouth 2 (two) times daily.     . fenofibrate 160 MG tablet Take 1 tablet (160 mg total) by mouth daily. 30 tablet 2  . Ferrous Sulfate (IRON) 325 (65 FE) MG TABS Take 1 tablet by mouth 2 (two) times daily. (Patient taking differently: Take 325 mg by mouth 2 (two) times daily. ) 90 each 3  . Ipratropium-Albuterol (COMBIVENT RESPIMAT) 20-100 MCG/ACT AERS respimat Inhale 1 puff into the lungs 4 (four) times daily as needed for wheezing. 1 Inhaler 3  . metoprolol tartrate (LOPRESSOR) 25 MG tablet Take 0.5 tablets (12.5 mg total) by mouth 2 (two) times daily. 60 tablet 6  . Multiple Vitamins-Minerals (CENTRUM SILVER PO) Take 1 tablet by mouth daily.     . pantoprazole (PROTONIX) 40 MG tablet  TAKE ONE TABLET BY MOUTH ONCE DAILY (Patient taking differently: Take 40 mg by mouth daily. ) 90 tablet 3  . valsartan-hydrochlorothiazide (DIOVAN HCT) 160-12.5 MG per tablet Take 1 tablet by mouth daily. 90 tablet 1  . VOLTAREN 1 % GEL Apply 2 g topically 4 (four) times daily as needed (for arthritis).     . clopidogrel (PLAVIX) 75 MG tablet Take 1 tablet (75 mg total) by mouth daily with breakfast. (Patient not taking: Reported on 11/19/2014) 30 tablet 11   No  current facility-administered medications for this visit.      Physical Exam:   BP 116/57 mmHg  Pulse 79  Resp 20  Ht 5\' 3"  (1.6 m)  Wt 145 lb (65.772 kg)  BMI 25.69 kg/m2  SpO2 93%  General:  elderly  Chest:   Few scattered wheezes, breath sounds symmetrical  CV:   RRR w/ harsh systolic murmur  Incisions:  n/a  Abdomen:  Soft, non-distended, non-tender  Extremities:  Warm, well-perfused, no edema  Diagnostic Tests:  Transthoracic Echocardiography  Patient:  Ronald Bell, Ronald Bell MR #:    [id number] Study Date: 04/06/2014 Gender:   M Age:    74 Height:   160 cm Weight:   59.9 kg BSA:    1.64 m^2 Pt. Status: Room:  Arvin Collard, M.D. REFERRING  Candee Furbish, M.D. SONOGRAPHER Wyatt Mage, RDCS ATTENDING  Lyman Bishop MD PERFORMING  [facility], Outpatient  cc:  ------------------------------------------------------------------- LV EF: 55% -  60%  ------------------------------------------------------------------- Indications:   Murmur (R01.1).  ------------------------------------------------------------------- History:  PMH:  Chronic obstructive pulmonary disease. Risk factors: PVD. Carotid artery stenosis. GERD. GI bleed. H/o Barrett&'s esophagus. Rosacea. Anemia. Current tobacco use. Hypertension. Dyslipidemia.  ------------------------------------------------------------------- Study Conclusions  - Left ventricle: The cavity size was is borderline dilated. Wall thickness was normal. Systolic function was normal. The estimated ejection fraction was in the range of 55% to 60%. Wall motion was normal; there were no regional wall motion abnormalities. Doppler parameters are consistent with abnormal left ventricular relaxation (grade 1 diastolic dysfunction). The E/e&' ratio is between 8-15, suggesting indeterminate LV filling pressure. - Aortic valve: Heavily calcified aortic valve leaflets. Peak  and mean gradients of 60 and 34 mmHg, respectively. Based on an LVOT diameter of 2.0 cm, the calculated AVA is 0.8 cm2, consistent with severe aortic stenosis. There is mild regurgitation. Valve area (VTI): 0.74 cm^2. Valve area (Vmean): 0.68 cm^2. - Mitral valve: Calcified annulus. Mildly thickened leaflets . There was trivial regurgitation. - Left atrium: Severely dilated at 49 ml/m2.  Impressions:  - LVEF 55-60%, normal wall thickness, borderline dilated LV, normal wall motion, diastolic dysfunction, likely elevated LV filling pressure, there is severe aortic stenosis, peak and mean gradients of 60 mmHG and 35 mmHg, respectively. AVA is around 0.8 cm2. Mild AI, severely dilated LA  Transthoracic echocardiography. M-mode, complete 2D, spectral Doppler, and color Doppler. Birthdate: Patient birthdate: 04/28/1940. Age: Patient is 74 yr old. Sex: Gender: male. BMI: 23.4 kg/m^2. Blood pressure:   130/50 Patient status: Outpatient. Study date: Study date: 04/06/2014. Study time: 08:15 AM. Location: [facility]  -------------------------------------------------------------------  ------------------------------------------------------------------- Left ventricle: The cavity size was is borderline dilated. Wall thickness was normal. Systolic function was normal. The estimated ejection fraction was in the range of 55% to 60%. Wall motion was normal; there were no regional wall motion abnormalities. Doppler parameters are consistent with abnormal left ventricular relaxation (grade 1 diastolic dysfunction). The E/e&' ratio is between 8-15, suggesting indeterminate LV filling  pressure.  ------------------------------------------------------------------- Aortic valve: Heavily calcified aortic valve leaflets. Peak and mean gradients of 60 and 34 mmHg, respectively. Based on an LVOT diameter of 2.0 cm, the calculated AVA is 0.8 cm2, consistent  with severe aortic stenosis. There is mild regurgitation. Doppler: VTI ratio of LVOT to aortic valve: 0.24. Valve area (VTI): 0.74 cm^2. Indexed valve area (VTI): 0.45 cm^2/m^2. Mean velocity ratio of LVOT to aortic valve: 0.22. Valve area (Vmean): 0.68 cm^2. Indexed valve area (Vmean): 0.41 cm^2/m^2.  Mean gradient (S): 29 mm Hg.  ------------------------------------------------------------------- Aorta: Aortic root: The aortic root was normal in size. Ascending aorta: The ascending aorta was normal in size.  ------------------------------------------------------------------- Mitral valve:  Calcified annulus. Mildly thickened leaflets . Doppler: There was trivial regurgitation.  Peak gradient (D): 3 mm Hg.  ------------------------------------------------------------------- Left atrium: Severely dilated at 49 ml/m2.  ------------------------------------------------------------------- Atrial septum: No defect or patent foramen ovale was identified.  ------------------------------------------------------------------- Right ventricle: The cavity size was normal. Wall thickness was normal. Systolic function was normal.  ------------------------------------------------------------------- Pulmonic valve:  The valve appears to be grossly normal. Doppler: There was no significant regurgitation.  ------------------------------------------------------------------- Tricuspid valve:  Doppler: There was no significant regurgitation.  ------------------------------------------------------------------- Pulmonary artery:  The main pulmonary artery was normal-sized.  ------------------------------------------------------------------- Right atrium: The atrium was normal in size.  ------------------------------------------------------------------- Pericardium: There was no pericardial  effusion.  ------------------------------------------------------------------- Systemic veins: Inferior vena cava: The vessel was normal in size. The respirophasic diameter changes were in the normal range (>= 50%), consistent with normal central venous pressure.  ------------------------------------------------------------------- Measurements  Left ventricle              Value     Reference LV ID, ED, PLAX chordal      (H)   55  mm    43 - 52 LV ID, ES, PLAX chordal      (H)   41  mm    23 - 38 LV fx shortening, PLAX chordal  (L)   25  %    >=29 LV PW thickness, ED            8   mm    --------- IVS/LV PW ratio, ED            1       <=1.3 Stroke volume, 2D             62  ml    --------- Stroke volume/bsa, 2D           38  ml/m^2  --------- LV e&', lateral              7.46 cm/s   --------- LV E/e&', lateral             12.23     --------- LV e&', medial               5.84 cm/s   --------- LV E/e&', medial              15.62     --------- LV e&', average              6.65 cm/s   --------- LV E/e&', average             13.71     ---------  Ventricular septum            Value     Reference IVS thickness, ED             8   mm    ---------  LVOT                   Value     Reference LVOT ID, S                20  mm    --------- LVOT area                 3.14 cm^2   --------- LVOT mean velocity, S           54.1 cm/s   --------- LVOT VTI, S                19.9 cm    ---------  Aortic valve               Value     Reference Aortic valve mean velocity, S       250  cm/s    --------- Aortic valve VTI, S            84.5 cm    --------- Aortic mean gradient, S          29  mm Hg  --------- VTI ratio, LVOT/AV            0.24      --------- Aortic valve area, VTI          0.74 cm^2   --------- Aortic valve area/bsa, VTI        0.45 cm^2/m^2 --------- Velocity ratio, mean, LVOT/AV       0.22      --------- Aortic valve area, mean velocity     0.68 cm^2   --------- Aortic valve area/bsa, mean        0.41 cm^2/m^2 --------- velocity Aortic regurg pressure half-time     316  ms    ---------  Aorta                   Value     Reference Aortic root ID, ED            32  mm    ---------  Left atrium                Value     Reference LA ID, A-P, ES              41  mm    --------- LA ID/bsa, A-P          (H)   2.5  cm/m^2  <=2.2 LA volume, S               78.5 ml    --------- LA volume/bsa, S             47.9 ml/m^2  --------- LA volume, ES, 1-p A4C          63.9 ml    --------- LA volume/bsa, ES, 1-p A4C        39  ml/m^2  --------- LA volume, ES, 1-p A2C          87  ml    --------- LA volume/bsa, ES, 1-p A2C        53.1 ml/m^2  ---------  Mitral valve               Value     Reference Mitral E-wave peak velocity        91.2 cm/s   --------- Mitral deceleration time     (L)   102  ms    150 -  230 Mitral peak gradient, D          3   mm Hg  --------- Mitral E/A ratio, peak          0.7      ---------  Systemic veins              Value     Reference Estimated CVP               3   mm Hg  ---------  Right ventricle              Value      Reference RV e&', lateral              14.1 cm/s   ---------  Legend: (L) and (H) mark values outside specified reference range.  ------------------------------------------------------------------- Prepared and Electronically Authenticated by  Lyman Bishop MD 2016-01-15T10:38:01   CARDIAC CATHETERIZATION  PROCEDURE: Right and left heart catheterization with selective coronary angiography, left ventriculogram, cardiac output, selective oxygen saturations, right subclavian angiogram.  INDICATIONS: 74 year old with COPD, severe aortic stenosis, severe peripheral vascular disease, occluded right iliac, stented left iliac, moderate carotid bilaterally, left subclavian stenosis, porcelain aorta here for preoperative angiogram, aortic valve evaluation, right heart catheterization.  The risks, benefits, and details of the procedure were explained to the patient, including stroke, heart attack, death, renal impairment, arterial damage, bleeding. The patient verbalized understanding and wanted to proceed. Informed written consent was obtained.  PROCEDURE TECHNIQUE: A brachial IV was placed in the antecubital position. Lidocaine was administered surrounding brachial IV as well as radial artery site. A guidewire was placed through the brachial IV without difficulty. A 5 French short brachial sheath was then placed in the vein without difficulty under fluoroscopy. A 5 French balloontipped PA catheter was then inserted without difficulty and traversed through right-sided heart chambers to the wedge position. Multiple pressures obtained. Oxygen saturation in the pulmonary artery obtained. Catheter was then removed. Focus was then placed on the radial artery site where using the modified Seldinger technique a 5 French glide sheath was placed in the radial artery without difficulty. Verapamil 3 mg was administered. J-wire successfully traversed up to the subclavian origin and a  versa core wire was then utilized to traverse to the aortic valve position. A right subclavian angiogram was performed. Right Judkins 4 catheter was carefully positioned using fluoroscopy. Right coronary angiography was done using a Judkins R4 catheter. Multiple views with hand injection of Omnipaque were obtained. This catheter was then utilized to traverse the aortic valve. After unsuccessful attempt with J-wire, a straight wire was then utilized and successfully cross the aortic valve. Pressures were obtained prior to crossing in the aortic position and then post crossing in the LV position. A hand injected left ventriculogram was performed under fluoroscopy. Pullback was then obtained. Careful measurements of transvalvular pressures were performed. This catheter was then exchanged over the J-wire for a Judkins left 3.5 catheter which successfully cannulated the left main artery. Multiple views with hand injection were obtained under fluoroscopy of the left system. This catheter was then removed over the wire and she's were then removed utilizing Terumo T band and pressure. Findings were discussed with he and family.   CONTRAST: Total of 90 ml.  COMPLICATIONS: None.   HEMODYNAMICS:   Right atrium (RA): 12/10/8 mmHg Right ventricle (RV): 41/3/10 mmHg Pulmonary artery (PA): 39/17/26 mmHg Pulmonary capillary wedge pressure (PCWP): 17/14/14 mmHg  Cardiac output: Fick 4.87 liters/min Cardiac index: Fick 2.99  PA saturation: 67% FA saturation: 96%   Aortic pressure: 148/47 mmHg LV pressure: 814 mmHg systolic; LVEDP 17 mmHg.   Peak to peak gradient between the left ventricle and aorta was 35mmHg, mean 41.10mmHg, Calculated valve area 0.67cm squared.   ANGIOGRAPHIC DATA:   Left main: Calcified vessel branching into circumflex as well as LAD, no significant stenosis.  Left anterior descending (LAD): Densely calcified vessel with severe proximal/mid LAD stenosis surrounding major  septal branch of approximately 90% at its highest, continuing to large diagonal branch. Disease length is approximately 15-20 mm. Major diagonal branch also has 70% proximal stenosis.  Circumflex artery (CIRC): In the RAO caudal view there appears to be ostial/proximal circumflex disease, perhaps 90% stenosis. This vessel is also densely calcified and is fairly small in caliber distally. There are 2 small obtuse marginal branches.  Right coronary artery (RCA): This vessel is also densely calcified. There is a significant 90% stenosis in the mid RCA. There is also 50% stenosis in the proximal RCA at the first major bend of the vessel. This vessel is dominant giving rise to the posterior descending artery. It is fairly large in caliber.  LEFT VENTRICULOGRAM: Left ventricular angiogram was done in the 30 RAO projection with hand injection and revealed normal left ventricular wall motion and systolic function with an estimated ejection fraction of 65%.   IMPRESSIONS:  1. Severe triple-vessel coronary artery disease encompassing the proximal/mid LAD, ostial/proximal circumflex, mid RCA. All vessels are densely calcified. 2. Normal left ventricular systolic function. LVEDP 17 mmHg. Ejection fraction 65%. 3. Mildly elevated right-sided heart pressures. (Pulmonary artery systolic 39 mmHg-history of COPD) 4. Normal cardiac output. 5. Severe aortic stenosis-calculated valve area 0.67 cm with a mean gradient of 41 mmHg, peak to peak gradient of 44 mmHg. 6. Severe peripheral vascular disease-densely calcified carotid arteries noted on fluoroscopy as well as calcified right subclavian artery as well as brachiocephalic artery. There is a napkin ring, ridge of calcium in the mid brachiocephalic that necessitated versa core wire. The ascending and proximal distal aorta demonstrates severe calcification/atherosclerosis.  RECOMMENDATION: We will forward results to Dr. Roxy Manns and Dr. Julianne Handler. Obviously his  coronary anatomy as well as porcelain aorta and severe aortic stenosis make for a challenging situation. We will discuss all options.  Candee Furbish, MD    PERCUTANEOUS CORONARY INTERVENTION  Procedures    Coronary Stent Intervention    PACS Images    Show images for Cardiac catheterization     Link to Procedure Log    Procedure Log      Indications    Unstable angina [I20.0 (ICD-10-CM)]    Technique and Indications    Indication: 74 yo male with severe PAD, severe AS waiting on TAVR, severe mid RCA stenosis, moderate LAD stenosis. Planned PCI mid RCA with rotational atherectomy today.   Procedure: The risks, benefits, complications, treatment options, and expected outcomes were discussed with the patient. The patient and/or family concurred with the proposed plan, giving informed consent. The patient was brought to the cath lab after IV hydration was begun and oral premedication was given. The patient was further sedated with Versed and Fentanyl. The right groin was prepped and draped in the usual manner. Using the modified Seldinger access technique, a 6 French sheath was placed in the right femoral artery. Navigating a wire through the severely diseased right iliac artery was difficult but I was able to use a Glide wire to get into the aorta. A long 7 Fr sheath was then  placed in there right femoral artery. I then placed a 6 Fr sheath in the right femoral vein. A temporary pacemaker was placed into the RV. Angiomax bolus and drip given. JR 4 guide placed in the RCA and used to perform selective coronary angiography. When the ACT was over 200, I advanced a RotaFloppy wire down the RCA. I then used a 1.5 mm Rotaburr and made two passed down the mid RCA. The burr was changed for a 1.75 mm burr and two passes were made. A 2.5 x 12 mm balloon was used to pre-dilate the stenosis in the mid RCA. A 3.5 x 16 mm bare metal stent was deployed in the mid RCA. A 3.75 x 12 mm [place] balloon was used  to post-dilate the stent. The stenosis was taken from 95% down to 0%. TIMI-3 flow pre/post stenting.   EBL: 20cc   There were no immediate complications. The patient was taken to the recovery area in stable condition.    Conclusion    5. Prox RCA lesion, 40% stenosed. 6. Mid RCA lesion, 95% stenosed. There is a 0% residual stenosis post intervention. 7. A bare metal stent was placed.  1. Severe mid RCA stenosis now s/p successful rotablator atherectomy of the mid RCA with bare metal stent placement.    Recommendations: Will continue ASA and Plavix for one month. Probable d/c in am. He has completed his workup for TAVR. Will need to have f/u in our valve clinic in 1-2 weeks and will then plan a date for TAVR. Hopefully he will not have further GI bleeding on Plavix. (I waited to load until today).      Coronary Findings    Dominance: Right   Right Coronary Artery   . Prox RCA lesion, 40% stenosed. calcified discrete .   Marland Kitchen Mid RCA lesion, 95% stenosed. calcified discrete .   Marland Kitchen PCI: The pre-interventional distal flow is normal (TIMI 3). Pre-stent angioplasty was performed. A bare metal stent was placed. The strut is apposed. Post-stent angioplasty was performed. The post-interventional distal flow is normal (TIMI 3). The intervention was successful. No complications occurred at this lesion. Rotational atherectomy used to pre-treat lesion.  . There is no residual stenosis post intervention.        Coronary Diagrams    Diagnostic Diagram           Post-Intervention Diagram           Cardiac TAVR CT  TECHNIQUE: The patient was scanned on a Philips 256 scanner. A 120 kV retrospective scan was triggered in the descending thoracic aorta at 111 HU's. Gantry rotation speed was 270 msecs and collimation was .9 mm. No beta blockade or nitro were given. The 3D data set was reconstructed in 5% intervals of the R-R cycle. Systolic and diastolic phases were analyzed on a  dedicated work station using MPR, MIP and VRT modes. The patient received 80 cc of contrast.  FINDINGS: Aortic Valve: Trileaflet and heavily calcified. Mild annular calcification near base of left cusp. No significant MAC.  Aorta: Porcelin aortic root with severe calcification through out the arch and descending thoracic aorta as well. Bilateral proximal subclavian artery stenosis  Sinotubular Junction: 2.2 cm  Ascending Thoracic Aorta: 2.8 cm  Descending Thoracic Aorta: 2.2 cm  Sinus of Valsalva Measurements:  Non-coronary: 29 mm  Right -coronary: 26 mm  Left -coronary: 28.5 mm  Coronary Artery Height above Annulus:  Left Main: 15.4 mm  Right Coronary: 14.8 mm  Virtual Basal  Annulus Measurements:  Maximum/Minimum Diameter: 26.7 mm x 22.4 mm  Perimeter: 77 mm  Area: 465 mm2  Coronary Arteries: Sufficient height above annulus for delivery heavily calcified right dominant  Optimum Fluoroscopic Angle for Delivery: LAO 4 degress Cranial 1 degree  IMPRESSION: 1) Aortic annulus area of 465 mm2 suitable for 26 mm Sapien 3 valve  2) 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  On: 07/09/2014 17:04      Study Result     EXAM: OVER-READ INTERPRETATION CT CHEST  The following report is an over-read performed by radiologist Dr. Julian Hy of [facility] on 07/09/2014. This over-read does not include interpretation of cardiac or coronary anatomy or pathology. The coronary CTA interpretation by the cardiologist is attached.  COMPARISON: CT chest dated 04/20/2014  FINDINGS: No mediastinal lymphadenopathy.  No suspicious pulmonary nodules. Known right upper lobe  pulmonary nodules are excluded from the current gated study.  Emphysematous changes. Scarring/atelectasis in the left upper lobe. Calcified pleural plaques.  No focal osseous lesions.  IMPRESSION: No significant extracardiac findings.  Electronically Signed: By: Julian Hy M.D. On: 07/09/2014 11:17     CT ANGIOGRAPHY CHEST, ABDOMEN AND PELVIS  TECHNIQUE: Multidetector CT imaging through the chest, abdomen and pelvis was performed using the standard protocol during bolus administration of intravenous contrast. Multiplanar reconstructed images and MIPs were obtained and reviewed to evaluate the vascular anatomy.  CONTRAST: 31mL OMNIPAQUE IOHEXOL 350 MG/ML SOLN  COMPARISON: Chest CT 09/08/2013.  FINDINGS: CTA CHEST FINDINGS  Mediastinum/Lymph Nodes: Heart size is borderline enlarged. There is no significant pericardial fluid, thickening or pericardial calcification. There is atherosclerosis of the thoracic aorta, the great vessels of the mediastinum and the coronary arteries, including calcified atherosclerotic plaque in the left main, left anterior descending, left circumflex and right coronary arteries. In addition, there is high-grade stenosis at the origin of the left subclavian artery, and in the proximal right subclavian artery. Severe thickening calcifications of the aortic valve. No pathologically enlarged mediastinal or hilar lymph nodes. Esophagus is unremarkable in appearance. No axillary lymphadenopathy.  Lungs/Pleura: Extensive calcified pleural plaques are noted throughout the thorax bilaterally, indicative of asbestos related pleural disease. Multiple pulmonary nodules are noted in the lungs bilaterally, largest which measures 8 x 4 mm in the apex of the right upper lobe (image 7 of series 403). These nodules are generally similar in size, number and distribution to remote prior study 03/10/2013, and are considered likely benign. No  acute consolidative airspace disease. No pleural effusions. No definite fibrotic changes.  Musculoskeletal/Soft Tissues: There are no aggressive appearing lytic or blastic lesions noted in the visualized portions of the skeleton.  CTA ABDOMEN AND PELVIS FINDINGS  Hepatobiliary: 8 mm low attenuation lesion in the medial aspect of segment 6 of the liver is too small to characterize, but is statistically likely to represent a small cyst. No other suspicious appearing hepatic lesions are noted. No intra or extrahepatic biliary ductal dilatation. Tiny calcified gallstone lying dependently in the gallbladder. No current findings to suggest an acute cholecystitis at this time.  Pancreas: Unremarkable.  Spleen: Unremarkable.  Adrenals/Urinary Tract: Sub cm low-attenuation lesion in the upper pole of the right kidney is too small to definitively characterize, but is favored to represent a tiny cyst. Left kidney is normal in appearance.  Bilateral adrenal glands are normal in appearance. No hydroureteronephrosis. Urinary bladder is unremarkable.  Stomach/Bowel: The appearance of the stomach is normal. No pathologic dilatation of small bowel or colon. A few scattered colonic diverticulae are noted, without surrounding inflammatory changes to suggest an acute diverticulitis at this time. Normal appendix.  Vascular/Lymphatic: Vascular findings and measurements pertinent to potential TAVR procedure, as detailed below. Severe atherosclerosis. 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 Probable stent in the left common iliac artery distally.  Reproductive: Prostate gland and seminal vesicles are unremarkable in appearance. Bilateral vasectomy clips.  Other: No significant volume of ascites. No  pneumoperitoneum.  Musculoskeletal: Small well-defined sclerotic lesion in the left ilium adjacent to the sacroiliac joint, favored to represent a tiny bone island. There are no aggressive appearing lytic or blastic lesions noted in the visualized portions of the skeleton.  VASCULAR MEASUREMENTS PERTINENT TO TAVR:  AORTA:  Minimal Aortic Diameter - 10 x 9 mm  Severity of Aortic Calcification - severe  RIGHT PELVIS:  Right Common Iliac Artery -  Minimal Diameter - 6.8 x 3.6 mm  Tortuosity - mild  Calcification - severe  Right External Iliac Artery -  Minimal Diameter - 3.5 x 2.7 mm  Tortuosity - moderate  Calcification - severe  Right Common Femoral Artery -  Minimal Diameter - 5.3 x 3.2 mm  Tortuosity - mild  Calcification - severe  LEFT PELVIS:  Left Common Iliac Artery -  Minimal Diameter - 3.9 x 3.7 mm  Tortuosity - mild  Calcification - severe  Left External Iliac Artery -  Minimal Diameter - 4.5 x 3.1 mm  Tortuosity - moderate  Calcification - severe  Left Common Femoral Artery -  Minimal Diameter - 2.8 x 2.1 mm  Tortuosity - mild  Calcification - severe  Review of the MIP images confirms the above findings.  IMPRESSION: 1. Severe atherosclerosis, particularly throughout the pelvic arterial access vessels, as detailed above. This patient does not have suitable pelvic arterial access for TAVR procedure. 2. 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 3. Asbestos related pleural disease. No fibrotic changes in the lung parenchyma to suggest asbestosis at this time. 4. Multiple small pulmonary nodules scattered throughout the lungs bilaterally, similar in size, number and distribution to prior examinations dating back to 03/10/2013, strongly favored to be benign. Attention on one additional noncontrast chest CT in 12 months is suggested to  confirm greater than 24 months of stability. 5. Cholelithiasis without evidence of acute cholecystitis at this time. 6. Mild colonic diverticulosis without evidence of acute diverticulitis at this time. 7. Additional incidental findings, as above.   Electronically Signed  By: Vinnie Langton M.D.  On: 07/09/2014 15:24    Pulmonary Function Tests  BaselinePost-bronchodilator  FVC2.00 L (65% predicted)FVC2.17 L (71% predicted) FEV10.88 L (40% predicted)FEV11.02 L (46% predicted) FEF25-750.32 L (20% predicted)FEF25-750.43 L (26% predicted)  TLC5.80 L (104% predicted) RV3.40 L (159% predicted) DLCO45% predicted   STS Risk Calculator  Procedure    AVR + CABG  Risk of Mortality   7.0% Morbidity or Mortality  40.0% Prolonged LOS   23.4% Short LOS    15.8% Permanent Stroke   3.9% Prolonged Vent Support  28.6% DSW Infection    0.8% Renal Failure    11.1% Reoperation    14.5%  Impression:  Patient has stage D severe symptomatic aortic stenosis and multivessel coronary artery disease.  He presents with gradual progression of symptoms of exertional shortness of breath and fatigue consistent with chronic diastolic congestive heart failure, New York Heart Association functional class III. Transthoracic echocardiogram and diagnostic cardiac catheterization confirmed the presence of severe aortic stenosis with preserved left ventricular systolic function. The patient also has severe multivessel coronary artery disease including significant stenosis of the left anterior descending coronary artery and high-grade stenosis of the right coronary artery for which he recently underwent PCI and stenting. Risks associated with conventional surgical aortic  valve replacement and coronary artery bypass grafting would be very high because of the patient's numerous comorbid medical problems including severe COPD.  In addition, he has extensive calcification of the entire ascending thoracic aorta which would make open surgery extremely risky.  As a result, I would not consider this patient a candidate for conventional open surgical aortic valve replacement with or without concomitant coronary artery bypass grafting.  Unfortunately, the patient also has severe peripheral vascular disease and he does not have adequate pelvic vascular access for a transfemoral approach for TAVR.  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 Under the circumstances transcatheter aortic valve replacement via transapical approach seems the only reasonable option.    Plan:  The patient and his wife were again counseled at length regarding treatment alternatives for management of severe symptomatic aortic stenosis. Alternative approaches such as conventional aortic valve replacement, transcatheter aortic valve replacement, and palliative medical therapy were compared and contrasted at length.  The risks associated with conventional surgical aortic valve replacement were been discussed in detail, as were expectations for post-operative convalescence. Long-term prognosis with medical therapy was discussed. This discussion was placed in the context of the patient's own specific clinical presentation and past medical history.  All of their questions been addressed.  The patient desires to proceed with TAVR tomorrow as previously planned.  Following the decision to proceed with transcatheter aortic valve replacement, a discussion has been held regarding what types of management strategies would be attempted intraoperatively in the event of life-threatening complications, including whether or not the patient would be considered a candidate for  the use of cardiopulmonary bypass and/or conversion to open sternotomy for attempted surgical intervention.  The patient has been advised of a variety of complications that might develop including but not limited to risks of death, stroke, paravalvular leak, aortic dissection or other major vascular complications, aortic annulus rupture, device embolization, cardiac rupture or perforation, mitral regurgitation, acute myocardial infarction, arrhythmia, heart block or bradycardia requiring permanent pacemaker placement, congestive heart failure, respiratory failure, renal failure, pneumonia, infection, other late complications related to structural valve deterioration or migration, or other complications that might ultimately cause a temporary or permanent loss of functional independence or other long term morbidity.  The patient provides full informed consent for the procedure as described and all questions were answered.    I spent in excess of 30 minutes during the conduct of this office consultation and >50% of this time involved direct face-to-face encounter with the patient for counseling and/or coordination of their care.   Valentina Gu. Roxy Manns, MD 11/19/2014 12:29 PM

## 2014-11-20 ENCOUNTER — Inpatient Hospital Stay (HOSPITAL_COMMUNITY): Payer: Medicare Other

## 2014-11-20 ENCOUNTER — Inpatient Hospital Stay (HOSPITAL_COMMUNITY): Payer: Medicare Other | Admitting: Certified Registered"

## 2014-11-20 ENCOUNTER — Other Ambulatory Visit (HOSPITAL_COMMUNITY): Payer: Medicare Other

## 2014-11-20 ENCOUNTER — Encounter (HOSPITAL_COMMUNITY): Payer: Self-pay | Admitting: *Deleted

## 2014-11-20 ENCOUNTER — Inpatient Hospital Stay (HOSPITAL_COMMUNITY)
Admission: RE | Admit: 2014-11-20 | Discharge: 2014-11-27 | DRG: 266 | Disposition: A | Discharge status: Skilled Nursing Facility | Payer: Medicare Other | Source: Ambulatory Visit | Attending: Thoracic Surgery (Cardiothoracic Vascular Surgery) | Admitting: Thoracic Surgery (Cardiothoracic Vascular Surgery)

## 2014-11-20 ENCOUNTER — Encounter (HOSPITAL_COMMUNITY)
Admission: RE | Disposition: A | Discharge status: Skilled Nursing Facility | Payer: Medicare Other | Source: Ambulatory Visit | Attending: Thoracic Surgery (Cardiothoracic Vascular Surgery)

## 2014-11-20 ENCOUNTER — Ambulatory Visit: Payer: Medicare Other | Admitting: Family

## 2014-11-20 ENCOUNTER — Encounter (HOSPITAL_COMMUNITY): Payer: Medicare Other

## 2014-11-20 DIAGNOSIS — K922 Gastrointestinal hemorrhage, unspecified: Secondary | ICD-10-CM | POA: Diagnosis not present

## 2014-11-20 DIAGNOSIS — I10 Essential (primary) hypertension: Secondary | ICD-10-CM | POA: Diagnosis present

## 2014-11-20 DIAGNOSIS — K219 Gastro-esophageal reflux disease without esophagitis: Secondary | ICD-10-CM | POA: Diagnosis present

## 2014-11-20 DIAGNOSIS — E785 Hyperlipidemia, unspecified: Secondary | ICD-10-CM | POA: Diagnosis present

## 2014-11-20 DIAGNOSIS — Y838 Other surgical procedures as the cause of abnormal reaction of the patient, or of later complication, without mention of misadventure at the time of the procedure: Secondary | ICD-10-CM | POA: Diagnosis not present

## 2014-11-20 DIAGNOSIS — I350 Nonrheumatic aortic (valve) stenosis: Secondary | ICD-10-CM | POA: Diagnosis not present

## 2014-11-20 DIAGNOSIS — I352 Nonrheumatic aortic (valve) stenosis with insufficiency: Principal | ICD-10-CM | POA: Diagnosis present

## 2014-11-20 DIAGNOSIS — D500 Iron deficiency anemia secondary to blood loss (chronic): Secondary | ICD-10-CM | POA: Diagnosis present

## 2014-11-20 DIAGNOSIS — J9610 Chronic respiratory failure, unspecified whether with hypoxia or hypercapnia: Secondary | ICD-10-CM | POA: Diagnosis present

## 2014-11-20 DIAGNOSIS — I4891 Unspecified atrial fibrillation: Secondary | ICD-10-CM

## 2014-11-20 DIAGNOSIS — M6281 Muscle weakness (generalized): Secondary | ICD-10-CM | POA: Diagnosis not present

## 2014-11-20 DIAGNOSIS — K210 Gastro-esophageal reflux disease with esophagitis: Secondary | ICD-10-CM | POA: Diagnosis not present

## 2014-11-20 DIAGNOSIS — Z8249 Family history of ischemic heart disease and other diseases of the circulatory system: Secondary | ICD-10-CM

## 2014-11-20 DIAGNOSIS — Z955 Presence of coronary angioplasty implant and graft: Secondary | ICD-10-CM | POA: Diagnosis not present

## 2014-11-20 DIAGNOSIS — Z87891 Personal history of nicotine dependence: Secondary | ICD-10-CM | POA: Diagnosis not present

## 2014-11-20 DIAGNOSIS — I2510 Atherosclerotic heart disease of native coronary artery without angina pectoris: Secondary | ICD-10-CM | POA: Diagnosis not present

## 2014-11-20 DIAGNOSIS — I700 Atherosclerosis of aorta: Secondary | ICD-10-CM | POA: Diagnosis present

## 2014-11-20 DIAGNOSIS — Z952 Presence of prosthetic heart valve: Secondary | ICD-10-CM

## 2014-11-20 DIAGNOSIS — M9689 Other intraoperative and postprocedural complications and disorders of the musculoskeletal system: Secondary | ICD-10-CM | POA: Diagnosis not present

## 2014-11-20 DIAGNOSIS — Z5189 Encounter for other specified aftercare: Secondary | ICD-10-CM | POA: Diagnosis not present

## 2014-11-20 DIAGNOSIS — Y92234 Operating room of hospital as the place of occurrence of the external cause: Secondary | ICD-10-CM

## 2014-11-20 DIAGNOSIS — Y92238 Other place in hospital as the place of occurrence of the external cause: Secondary | ICD-10-CM | POA: Diagnosis not present

## 2014-11-20 DIAGNOSIS — I771 Stricture of artery: Secondary | ICD-10-CM | POA: Diagnosis present

## 2014-11-20 DIAGNOSIS — I25110 Atherosclerotic heart disease of native coronary artery with unstable angina pectoris: Secondary | ICD-10-CM | POA: Diagnosis present

## 2014-11-20 DIAGNOSIS — I5033 Acute on chronic diastolic (congestive) heart failure: Secondary | ICD-10-CM | POA: Diagnosis not present

## 2014-11-20 DIAGNOSIS — I358 Other nonrheumatic aortic valve disorders: Secondary | ICD-10-CM | POA: Diagnosis not present

## 2014-11-20 DIAGNOSIS — S2232XA Fracture of one rib, left side, initial encounter for closed fracture: Secondary | ICD-10-CM | POA: Diagnosis not present

## 2014-11-20 DIAGNOSIS — Z006 Encounter for examination for normal comparison and control in clinical research program: Secondary | ICD-10-CM

## 2014-11-20 DIAGNOSIS — I501 Left ventricular failure: Secondary | ICD-10-CM | POA: Diagnosis not present

## 2014-11-20 DIAGNOSIS — D509 Iron deficiency anemia, unspecified: Secondary | ICD-10-CM | POA: Diagnosis present

## 2014-11-20 DIAGNOSIS — D649 Anemia, unspecified: Secondary | ICD-10-CM | POA: Diagnosis not present

## 2014-11-20 DIAGNOSIS — I9789 Other postprocedural complications and disorders of the circulatory system, not elsewhere classified: Secondary | ICD-10-CM | POA: Diagnosis not present

## 2014-11-20 DIAGNOSIS — Z954 Presence of other heart-valve replacement: Secondary | ICD-10-CM | POA: Diagnosis not present

## 2014-11-20 DIAGNOSIS — R262 Difficulty in walking, not elsewhere classified: Secondary | ICD-10-CM | POA: Diagnosis not present

## 2014-11-20 DIAGNOSIS — J449 Chronic obstructive pulmonary disease, unspecified: Secondary | ICD-10-CM | POA: Diagnosis not present

## 2014-11-20 DIAGNOSIS — I739 Peripheral vascular disease, unspecified: Secondary | ICD-10-CM | POA: Diagnosis not present

## 2014-11-20 DIAGNOSIS — I509 Heart failure, unspecified: Secondary | ICD-10-CM | POA: Diagnosis not present

## 2014-11-20 DIAGNOSIS — I359 Nonrheumatic aortic valve disorder, unspecified: Secondary | ICD-10-CM | POA: Diagnosis not present

## 2014-11-20 DIAGNOSIS — I480 Paroxysmal atrial fibrillation: Secondary | ICD-10-CM | POA: Diagnosis not present

## 2014-11-20 DIAGNOSIS — J9811 Atelectasis: Secondary | ICD-10-CM | POA: Diagnosis not present

## 2014-11-20 HISTORY — PX: TEE WITHOUT CARDIOVERSION: SHX5443

## 2014-11-20 HISTORY — PX: TRANSCATHETER AORTIC VALVE REPLACEMENT, TRANSAPICAL: SHX6401

## 2014-11-20 HISTORY — PX: RIB PLATING: SHX5079

## 2014-11-20 HISTORY — DX: Presence of prosthetic heart valve: Z95.2

## 2014-11-20 LAB — POCT I-STAT, CHEM 8
BUN: 23 mg/dL — ABNORMAL HIGH (ref 6–20)
BUN: 21 mg/dL — AB (ref 6–20)
BUN: 21 mg/dL — AB (ref 6–20)
BUN: 17 mg/dL (ref 6–20)
CALCIUM ION: 1.26 mmol/L (ref 1.13–1.30)
CALCIUM ION: 1.21 mmol/L (ref 1.13–1.30)
CHLORIDE: 104 mmol/L (ref 101–111)
CREATININE: 0.8 mg/dL (ref 0.61–1.24)
CREATININE: 0.9 mg/dL (ref 0.61–1.24)
Calcium, Ion: 1.23 mmol/L (ref 1.13–1.30)
Calcium, Ion: 1.19 mmol/L (ref 1.13–1.30)
Chloride: 105 mmol/L (ref 101–111)
Chloride: 103 mmol/L (ref 101–111)
Chloride: 104 mmol/L (ref 101–111)
Creatinine, Ser: 1.1 mg/dL (ref 0.61–1.24)
Creatinine, Ser: 0.9 mg/dL (ref 0.61–1.24)
GLUCOSE: 139 mg/dL — AB (ref 65–99)
GLUCOSE: 118 mg/dL — AB (ref 65–99)
GLUCOSE: 123 mg/dL — AB (ref 65–99)
GLUCOSE: 109 mg/dL — AB (ref 65–99)
HCT: 27 % — ABNORMAL LOW (ref 39.0–52.0)
HCT: 24 % — ABNORMAL LOW (ref 39.0–52.0)
HEMATOCRIT: 25 % — AB (ref 39.0–52.0)
HEMATOCRIT: 30 % — AB (ref 39.0–52.0)
HEMOGLOBIN: 9.2 g/dL — AB (ref 13.0–17.0)
HEMOGLOBIN: 10.2 g/dL — AB (ref 13.0–17.0)
Hemoglobin: 8.2 g/dL — ABNORMAL LOW (ref 13.0–17.0)
Hemoglobin: 8.5 g/dL — ABNORMAL LOW (ref 13.0–17.0)
POTASSIUM: 4 mmol/L (ref 3.5–5.1)
POTASSIUM: 4.2 mmol/L (ref 3.5–5.1)
POTASSIUM: 4.4 mmol/L (ref 3.5–5.1)
POTASSIUM: 4.1 mmol/L (ref 3.5–5.1)
SODIUM: 138 mmol/L (ref 135–145)
Sodium: 137 mmol/L (ref 135–145)
Sodium: 137 mmol/L (ref 135–145)
Sodium: 137 mmol/L (ref 135–145)
TCO2: 23 mmol/L (ref 0–100)
TCO2: 23 mmol/L (ref 0–100)
TCO2: 24 mmol/L (ref 0–100)
TCO2: 21 mmol/L (ref 0–100)

## 2014-11-20 LAB — CREATININE, SERUM: Creatinine, Ser: 0.91 mg/dL (ref 0.61–1.24)

## 2014-11-20 LAB — CBC
HEMATOCRIT: 27.5 % — AB (ref 39.0–52.0)
HEMATOCRIT: 30.1 % — AB (ref 39.0–52.0)
HEMOGLOBIN: 9.8 g/dL — AB (ref 13.0–17.0)
Hemoglobin: 9.1 g/dL — ABNORMAL LOW (ref 13.0–17.0)
MCH: 32.2 pg (ref 26.0–34.0)
MCH: 31.5 pg (ref 26.0–34.0)
MCHC: 33.1 g/dL (ref 30.0–36.0)
MCHC: 32.6 g/dL (ref 30.0–36.0)
MCV: 97.2 fL (ref 78.0–100.0)
MCV: 96.8 fL (ref 78.0–100.0)
Platelets: 231 10*3/uL (ref 150–400)
Platelets: 238 10*3/uL (ref 150–400)
RBC: 2.83 MIL/uL — ABNORMAL LOW (ref 4.22–5.81)
RBC: 3.11 MIL/uL — ABNORMAL LOW (ref 4.22–5.81)
RDW: 15.2 % (ref 11.5–15.5)
RDW: 15.8 % — AB (ref 11.5–15.5)
WBC: 7.7 10*3/uL (ref 4.0–10.5)
WBC: 10.4 10*3/uL (ref 4.0–10.5)

## 2014-11-20 LAB — POCT I-STAT 3, ART BLOOD GAS (G3+)
ACID-BASE DEFICIT: 3 mmol/L — AB (ref 0.0–2.0)
ACID-BASE DEFICIT: 6 mmol/L — AB (ref 0.0–2.0)
ACID-BASE DEFICIT: 2 mmol/L (ref 0.0–2.0)
ACID-BASE EXCESS: 3 mmol/L — AB (ref 0.0–2.0)
Bicarbonate: 20.2 mEq/L (ref 20.0–24.0)
Bicarbonate: 23.2 mEq/L (ref 20.0–24.0)
Bicarbonate: 23.2 mEq/L (ref 20.0–24.0)
Bicarbonate: 27.5 mEq/L — ABNORMAL HIGH (ref 20.0–24.0)
O2 SAT: 94 %
O2 SAT: 96 %
O2 Saturation: 100 %
O2 Saturation: 92 %
PCO2 ART: 42.2 mmHg (ref 35.0–45.0)
PH ART: 7.342 — AB (ref 7.350–7.450)
PH ART: 7.35 (ref 7.350–7.450)
TCO2: 21 mmol/L (ref 0–100)
TCO2: 24 mmol/L (ref 0–100)
TCO2: 25 mmol/L (ref 0–100)
TCO2: 29 mmol/L (ref 0–100)
pCO2 arterial: 42.1 mmHg (ref 35.0–45.0)
pCO2 arterial: 41.2 mmHg (ref 35.0–45.0)
pCO2 arterial: 42 mmHg (ref 35.0–45.0)
pH, Arterial: 7.296 — ABNORMAL LOW (ref 7.350–7.450)
pH, Arterial: 7.425 (ref 7.350–7.450)
pO2, Arterial: 227 mmHg — ABNORMAL HIGH (ref 80.0–100.0)
pO2, Arterial: 66 mmHg — ABNORMAL LOW (ref 80.0–100.0)
pO2, Arterial: 69 mmHg — ABNORMAL LOW (ref 80.0–100.0)
pO2, Arterial: 89 mmHg (ref 80.0–100.0)

## 2014-11-20 LAB — POCT I-STAT 4, (NA,K, GLUC, HGB,HCT)
Glucose, Bld: 110 mg/dL — ABNORMAL HIGH (ref 65–99)
HCT: 31 % — ABNORMAL LOW (ref 39.0–52.0)
HEMOGLOBIN: 10.5 g/dL — AB (ref 13.0–17.0)
POTASSIUM: 4.2 mmol/L (ref 3.5–5.1)
SODIUM: 136 mmol/L (ref 135–145)

## 2014-11-20 LAB — GLUCOSE, CAPILLARY
GLUCOSE-CAPILLARY: 102 mg/dL — AB (ref 65–99)
GLUCOSE-CAPILLARY: 106 mg/dL — AB (ref 65–99)
GLUCOSE-CAPILLARY: 106 mg/dL — AB (ref 65–99)
GLUCOSE-CAPILLARY: 118 mg/dL — AB (ref 65–99)
GLUCOSE-CAPILLARY: 93 mg/dL (ref 65–99)

## 2014-11-20 LAB — PROTIME-INR
INR: 1.52 — ABNORMAL HIGH (ref 0.00–1.49)
Prothrombin Time: 18.4 seconds — ABNORMAL HIGH (ref 11.6–15.2)

## 2014-11-20 LAB — APTT: APTT: 37 s (ref 24–37)

## 2014-11-20 LAB — CG4 I-STAT (LACTIC ACID): Lactic Acid, Venous: 1.01 mmol/L (ref 0.5–2.0)

## 2014-11-20 LAB — PREPARE RBC (CROSSMATCH)

## 2014-11-20 LAB — MAGNESIUM: Magnesium: 2.8 mg/dL — ABNORMAL HIGH (ref 1.7–2.4)

## 2014-11-20 SURGERY — REPLACEMENT, AORTIC VALVE, TRANSAPICAL APPROACH
Anesthesia: General | Site: Chest

## 2014-11-20 MED ORDER — PHENYLEPHRINE HCL 10 MG/ML IJ SOLN
0.0000 ug/min | INTRAVENOUS | Status: DC
  Administered 2014-11-20: 10 ug/min via INTRAVENOUS
  Administered 2014-11-21: 30 ug/min via INTRAVENOUS
  Filled 2014-11-20 (×3): qty 2

## 2014-11-20 MED ORDER — EPINEPHRINE HCL 1 MG/ML IJ SOLN
INTRAMUSCULAR | Status: AC
  Filled 2014-11-20: qty 1

## 2014-11-20 MED ORDER — ACETAMINOPHEN 650 MG RE SUPP
650.0000 mg | Freq: Once | RECTAL | Status: AC
  Administered 2014-11-20: 650 mg via RECTAL

## 2014-11-20 MED ORDER — SODIUM CHLORIDE 0.9 % IV SOLN: 250.0000 mL | INTRAVENOUS | Status: DC | PRN

## 2014-11-20 MED ORDER — ETOMIDATE 2 MG/ML IV SOLN
INTRAVENOUS | Status: DC | PRN
  Administered 2014-11-20: 12 mg via INTRAVENOUS

## 2014-11-20 MED ORDER — FENTANYL CITRATE (PF) 250 MCG/5ML IJ SOLN
INTRAMUSCULAR | Status: AC
  Filled 2014-11-20: qty 5

## 2014-11-20 MED ORDER — IODIXANOL 320 MG/ML IV SOLN
INTRAVENOUS | Status: DC | PRN
  Administered 2014-11-20: 91.1 mL via INTRA_ARTERIAL

## 2014-11-20 MED ORDER — ARTIFICIAL TEARS OP OINT
TOPICAL_OINTMENT | OPHTHALMIC | Status: DC | PRN
  Administered 2014-11-20: 1 via OPHTHALMIC

## 2014-11-20 MED ORDER — LACTATED RINGERS IV SOLN: INTRAVENOUS | Status: DC

## 2014-11-20 MED ORDER — SODIUM CHLORIDE 0.9 % IJ SOLN: 3.0000 mL | INTRAMUSCULAR | Status: DC | PRN

## 2014-11-20 MED ORDER — METOPROLOL TARTRATE 12.5 MG HALF TABLET
12.5000 mg | ORAL_TABLET | Freq: Two times a day (BID) | ORAL | Status: DC
  Administered 2014-11-23 – 2014-11-27 (×8): 12.5 mg via ORAL
  Filled 2014-11-20 (×17): qty 1

## 2014-11-20 MED ORDER — IPRATROPIUM-ALBUTEROL 0.5-2.5 (3) MG/3ML IN SOLN
RESPIRATORY_TRACT | Status: AC
  Administered 2014-11-20: 3 mL via RESPIRATORY_TRACT
  Filled 2014-11-20: qty 3

## 2014-11-20 MED ORDER — FAMOTIDINE IN NACL 20-0.9 MG/50ML-% IV SOLN
20.0000 mg | Freq: Two times a day (BID) | INTRAVENOUS | Status: AC
  Administered 2014-11-20: 20 mg via INTRAVENOUS

## 2014-11-20 MED ORDER — PANTOPRAZOLE SODIUM 40 MG PO TBEC
40.0000 mg | DELAYED_RELEASE_TABLET | Freq: Every day | ORAL | Status: DC
  Administered 2014-11-21 – 2014-11-27 (×7): 40 mg via ORAL
  Filled 2014-11-20 (×7): qty 1

## 2014-11-20 MED ORDER — FUROSEMIDE 10 MG/ML IJ SOLN
40.0000 mg | Freq: Once | INTRAMUSCULAR | Status: AC
  Administered 2014-11-20: 40 mg via INTRAVENOUS

## 2014-11-20 MED ORDER — ACETAMINOPHEN 500 MG PO TABS
1000.0000 mg | ORAL_TABLET | Freq: Four times a day (QID) | ORAL | Status: AC
  Administered 2014-11-21 – 2014-11-25 (×16): 1000 mg via ORAL
  Filled 2014-11-20 (×21): qty 2

## 2014-11-20 MED ORDER — SODIUM CHLORIDE 0.9 % IR SOLN
Status: DC | PRN
  Administered 2014-11-20 (×3): 500 mL

## 2014-11-20 MED ORDER — NITROGLYCERIN IN D5W 200-5 MCG/ML-% IV SOLN: 0.0000 ug/min | INTRAVENOUS | Status: DC

## 2014-11-20 MED ORDER — VANCOMYCIN HCL IN DEXTROSE 1-5 GM/200ML-% IV SOLN
1000.0000 mg | Freq: Once | INTRAVENOUS | Status: AC
  Administered 2014-11-20: 1000 mg via INTRAVENOUS
  Filled 2014-11-20: qty 200

## 2014-11-20 MED ORDER — SODIUM BICARBONATE 8.4 % IV SOLN
50.0000 meq | Freq: Once | INTRAVENOUS | Status: AC
  Administered 2014-11-20: 50 meq via INTRAVENOUS

## 2014-11-20 MED ORDER — ALBUMIN HUMAN 5 % IV SOLN
INTRAVENOUS | Status: DC | PRN
  Administered 2014-11-20 (×2): via INTRAVENOUS

## 2014-11-20 MED ORDER — SODIUM CHLORIDE 0.9 % IV SOLN: Freq: Once | INTRAVENOUS | Status: DC

## 2014-11-20 MED ORDER — ROCURONIUM BROMIDE 100 MG/10ML IV SOLN
INTRAVENOUS | Status: DC | PRN
  Administered 2014-11-20: 10 mg via INTRAVENOUS
  Administered 2014-11-20: 40 mg via INTRAVENOUS

## 2014-11-20 MED ORDER — HEPARIN SODIUM (PORCINE) 1000 UNIT/ML IJ SOLN
INTRAMUSCULAR | Status: AC
  Filled 2014-11-20: qty 1

## 2014-11-20 MED ORDER — ALBUTEROL SULFATE (2.5 MG/3ML) 0.083% IN NEBU
2.5000 mg | INHALATION_SOLUTION | RESPIRATORY_TRACT | Status: DC | PRN
  Administered 2014-11-20 – 2014-11-21 (×2): 2.5 mg via RESPIRATORY_TRACT
  Filled 2014-11-20 (×2): qty 3

## 2014-11-20 MED ORDER — MORPHINE SULFATE (PF) 2 MG/ML IV SOLN
2.0000 mg | INTRAVENOUS | Status: DC | PRN
  Administered 2014-11-20: 2 mg via INTRAVENOUS
  Administered 2014-11-20 (×2): 4 mg via INTRAVENOUS
  Administered 2014-11-20: 2 mg via INTRAVENOUS
  Administered 2014-11-21: 4 mg via INTRAVENOUS
  Administered 2014-11-21 (×2): 2 mg via INTRAVENOUS
  Administered 2014-11-21: 4 mg via INTRAVENOUS
  Filled 2014-11-20 (×2): qty 1
  Filled 2014-11-20: qty 2
  Filled 2014-11-20: qty 1
  Filled 2014-11-20 (×2): qty 2
  Filled 2014-11-20: qty 1

## 2014-11-20 MED ORDER — LIDOCAINE HCL (CARDIAC) 20 MG/ML IV SOLN
INTRAVENOUS | Status: DC | PRN
  Administered 2014-11-20: 30 mg via INTRAVENOUS

## 2014-11-20 MED ORDER — INSULIN ASPART 100 UNIT/ML ~~LOC~~ SOLN
0.0000 [IU] | SUBCUTANEOUS | Status: DC
  Administered 2014-11-21 (×3): 2 [IU] via SUBCUTANEOUS

## 2014-11-20 MED ORDER — MAGNESIUM SULFATE 4 GM/100ML IV SOLN
4.0000 g | Freq: Once | INTRAVENOUS | Status: AC
  Administered 2014-11-20: 4 g via INTRAVENOUS
  Filled 2014-11-20: qty 100

## 2014-11-20 MED ORDER — SODIUM CHLORIDE 0.9 % IV SOLN: 1.0000 mL/kg/h | INTRAVENOUS | Status: AC

## 2014-11-20 MED ORDER — OXYCODONE HCL 5 MG PO TABS
5.0000 mg | ORAL_TABLET | ORAL | Status: DC | PRN
  Administered 2014-11-21 (×3): 10 mg via ORAL
  Administered 2014-11-22: 5 mg via ORAL
  Administered 2014-11-22: 10 mg via ORAL
  Administered 2014-11-23: 5 mg via ORAL
  Filled 2014-11-20 (×2): qty 1
  Filled 2014-11-20 (×4): qty 2

## 2014-11-20 MED ORDER — FENTANYL CITRATE (PF) 100 MCG/2ML IJ SOLN
INTRAMUSCULAR | Status: DC | PRN
  Administered 2014-11-20 (×7): 50 ug via INTRAVENOUS
  Administered 2014-11-20: 100 ug via INTRAVENOUS
  Administered 2014-11-20: 50 ug via INTRAVENOUS

## 2014-11-20 MED ORDER — SODIUM CHLORIDE 0.9 % IV SOLN
INTRAVENOUS | Status: DC | PRN
  Administered 2014-11-20: 10:00:00 via INTRAVENOUS

## 2014-11-20 MED ORDER — ASPIRIN 81 MG PO CHEW: 81.0000 mg | CHEWABLE_TABLET | Freq: Every day | ORAL | Status: DC

## 2014-11-20 MED ORDER — ONDANSETRON HCL 4 MG/2ML IJ SOLN
4.0000 mg | Freq: Four times a day (QID) | INTRAMUSCULAR | Status: DC | PRN
Start: 2014-11-20 — End: 2014-11-27

## 2014-11-20 MED ORDER — LACTATED RINGERS IV SOLN
INTRAVENOUS | Status: DC | PRN
  Administered 2014-11-20: 07:00:00 via INTRAVENOUS

## 2014-11-20 MED ORDER — EPINEPHRINE HCL 0.1 MG/ML IJ SOSY
PREFILLED_SYRINGE | INTRAMUSCULAR | Status: AC
  Filled 2014-11-20: qty 10

## 2014-11-20 MED ORDER — MIDAZOLAM HCL 2 MG/2ML IJ SOLN
INTRAMUSCULAR | Status: AC
  Filled 2014-11-20: qty 4

## 2014-11-20 MED ORDER — CHLORHEXIDINE GLUCONATE 0.12% ORAL RINSE (MEDLINE KIT)
15.0000 mL | Freq: Two times a day (BID) | OROMUCOSAL | Status: DC
  Administered 2014-11-20: 15 mL via OROMUCOSAL

## 2014-11-20 MED ORDER — DEXMEDETOMIDINE HCL IN NACL 200 MCG/50ML IV SOLN: 0.1000 ug/kg/h | INTRAVENOUS | Status: DC

## 2014-11-20 MED ORDER — MORPHINE SULFATE (PF) 2 MG/ML IV SOLN
1.0000 mg | INTRAVENOUS | Status: AC | PRN
  Filled 2014-11-20: qty 2

## 2014-11-20 MED ORDER — MIDAZOLAM HCL 5 MG/5ML IJ SOLN
INTRAMUSCULAR | Status: DC | PRN
  Administered 2014-11-20 (×2): 1 mg via INTRAVENOUS

## 2014-11-20 MED ORDER — LIDOCAINE HCL (CARDIAC) 20 MG/ML IV SOLN
INTRAVENOUS | Status: AC
  Filled 2014-11-20: qty 5

## 2014-11-20 MED ORDER — CLOPIDOGREL BISULFATE 75 MG PO TABS
75.0000 mg | ORAL_TABLET | Freq: Every day | ORAL | Status: DC
  Administered 2014-11-21 – 2014-11-27 (×7): 75 mg via ORAL
  Filled 2014-11-20 (×9): qty 1

## 2014-11-20 MED ORDER — MIDAZOLAM HCL 2 MG/2ML IJ SOLN
2.0000 mg | INTRAMUSCULAR | Status: DC | PRN
  Administered 2014-11-20 (×2): 2 mg via INTRAVENOUS
  Filled 2014-11-20 (×2): qty 2

## 2014-11-20 MED ORDER — SODIUM CHLORIDE 0.45 % IV SOLN
INTRAVENOUS | Status: DC
  Administered 2014-11-20: 20 mL/h via INTRAVENOUS

## 2014-11-20 MED ORDER — ANTISEPTIC ORAL RINSE SOLUTION (CORINZ)
7.0000 mL | Freq: Four times a day (QID) | OROMUCOSAL | Status: DC
  Administered 2014-11-20 – 2014-11-27 (×16): 7 mL via OROMUCOSAL

## 2014-11-20 MED ORDER — LACTATED RINGERS IV SOLN: 500.0000 mL | Freq: Once | INTRAVENOUS | Status: DC | PRN

## 2014-11-20 MED ORDER — INSULIN REGULAR BOLUS VIA INFUSION
0.0000 [IU] | Freq: Three times a day (TID) | INTRAVENOUS | Status: DC
  Filled 2014-11-20: qty 10

## 2014-11-20 MED ORDER — SODIUM CHLORIDE 0.9 % IJ SOLN
3.0000 mL | Freq: Two times a day (BID) | INTRAMUSCULAR | Status: DC
  Administered 2014-11-21 – 2014-11-22 (×2): 3 mL via INTRAVENOUS
  Administered 2014-11-22: 10 mL via INTRAVENOUS
  Administered 2014-11-23 – 2014-11-27 (×4): 3 mL via INTRAVENOUS

## 2014-11-20 MED ORDER — ALBUMIN HUMAN 5 % IV SOLN
250.0000 mL | INTRAVENOUS | Status: AC | PRN
Start: 2014-11-20 — End: 2014-11-21
  Administered 2014-11-20: 250 mL via INTRAVENOUS

## 2014-11-20 MED ORDER — HEPARIN SODIUM (PORCINE) 1000 UNIT/ML IJ SOLN
INTRAMUSCULAR | Status: DC | PRN
  Administered 2014-11-20: 10000 [IU] via INTRAVENOUS

## 2014-11-20 MED ORDER — DEXTROSE 5 % IV SOLN
1.5000 g | Freq: Two times a day (BID) | INTRAVENOUS | Status: AC
  Administered 2014-11-20 – 2014-11-22 (×4): 1.5 g via INTRAVENOUS
  Filled 2014-11-20 (×4): qty 1.5

## 2014-11-20 MED ORDER — ACETAMINOPHEN 160 MG/5ML PO SOLN: 1000.0000 mg | Freq: Four times a day (QID) | ORAL | Status: DC

## 2014-11-20 MED ORDER — METOPROLOL TARTRATE 1 MG/ML IV SOLN: 2.5000 mg | INTRAVENOUS | Status: DC | PRN

## 2014-11-20 MED ORDER — SODIUM CHLORIDE 0.9 % IV SOLN
1.0000 mL/kg/h | INTRAVENOUS | Status: AC
Start: 2014-11-20 — End: 2014-11-20

## 2014-11-20 MED ORDER — ACETAMINOPHEN 160 MG/5ML PO SOLN: 650.0000 mg | Freq: Once | ORAL | Status: AC

## 2014-11-20 MED ORDER — PROTAMINE SULFATE 10 MG/ML IV SOLN
INTRAVENOUS | Status: AC
  Filled 2014-11-20: qty 10

## 2014-11-20 MED ORDER — POTASSIUM CHLORIDE 10 MEQ/50ML IV SOLN: 10.0000 meq | INTRAVENOUS | Status: AC

## 2014-11-20 MED ORDER — PROTAMINE SULFATE 10 MG/ML IV SOLN
INTRAVENOUS | Status: DC | PRN
  Administered 2014-11-20: 50 mg via INTRAVENOUS
  Administered 2014-11-20: 25 mg via INTRAVENOUS
  Administered 2014-11-20: 15 mg via INTRAVENOUS
  Administered 2014-11-20: 10 mg via INTRAVENOUS

## 2014-11-20 MED ORDER — INSULIN REGULAR HUMAN 100 UNIT/ML IJ SOLN
INTRAMUSCULAR | Status: DC
  Filled 2014-11-20: qty 2.5

## 2014-11-20 MED ORDER — PROPOFOL 10 MG/ML IV BOLUS
INTRAVENOUS | Status: AC
  Filled 2014-11-20: qty 20

## 2014-11-20 MED ORDER — IPRATROPIUM-ALBUTEROL 0.5-2.5 (3) MG/3ML IN SOLN
3.0000 mL | Freq: Four times a day (QID) | RESPIRATORY_TRACT | Status: DC
  Administered 2014-11-20 – 2014-11-23 (×12): 3 mL via RESPIRATORY_TRACT
  Filled 2014-11-20 (×12): qty 3

## 2014-11-20 SURGICAL SUPPLY — 111 items
ADH SKN CLS APL DERMABOND .7 (GAUZE/BANDAGES/DRESSINGS) ×3
ANTEGRADE CPLG (MISCELLANEOUS) IMPLANT
BAG BANDED W/RUBBER/TAPE 36X54 (MISCELLANEOUS) ×4 IMPLANT
BAG DECANTER FOR FLEXI CONT (MISCELLANEOUS) ×4 IMPLANT
BAG EQP BAND 135X91 W/RBR TAPE (MISCELLANEOUS) ×3
BAG SNAP BAND KOVER 36X36 (MISCELLANEOUS) ×8 IMPLANT
BATTERY PACK STR FOR DRIVER (MISCELLANEOUS) ×1 IMPLANT
BLADE OSCILLATING /SAGITTAL (BLADE) IMPLANT
BLADE STERNUM SYSTEM 6 (BLADE) ×4 IMPLANT
BLADE SURG ROTATE 9660 (MISCELLANEOUS) ×4 IMPLANT
CABLE PACING FASLOC BIEGE (MISCELLANEOUS) IMPLANT
CABLE PACING FASLOC BLUE (MISCELLANEOUS) ×4 IMPLANT
CANNULA FEM VENOUS REMOTE 22FR (CANNULA) IMPLANT
CANNULA OPTISITE PERFUSION 16F (CANNULA) IMPLANT
CANNULA OPTISITE PERFUSION 18F (CANNULA) IMPLANT
CANNULA SUMP PERICARDIAL (CANNULA) ×4 IMPLANT
CATH CROSS OVER TEMPO 5F (CATHETERS) ×1 IMPLANT
CATH DIAG EXPO 6F FR4 (CATHETERS) ×1 IMPLANT
CATH DIAG EXPO 6F VENT PIG 145 (CATHETERS) ×1 IMPLANT
CATH S G BIP PACING (SET/KITS/TRAYS/PACK) ×5 IMPLANT
CLIP TI MEDIUM 6 (CLIP) ×4 IMPLANT
CLIP TI WIDE RED SMALL 6 (CLIP) ×4 IMPLANT
CONN ST 1/4X3/8  BEN (MISCELLANEOUS) ×1
CONN ST 1/4X3/8 BEN (MISCELLANEOUS) ×3 IMPLANT
CONT SPECI 4OZ STER CLIK (MISCELLANEOUS) ×7 IMPLANT
COVER BACK TABLE 24X17X13 BIG (DRAPES) ×4 IMPLANT
COVER DOME SNAP 22 D (MISCELLANEOUS) ×4 IMPLANT
COVER TABLE BACK 60X90 (DRAPES) ×12 IMPLANT
CRADLE DONUT ADULT HEAD (MISCELLANEOUS) ×4 IMPLANT
DERMABOND ADVANCED (GAUZE/BANDAGES/DRESSINGS) ×1
DERMABOND ADVANCED .7 DNX12 (GAUZE/BANDAGES/DRESSINGS) ×3 IMPLANT
DRAIN CHANNEL 28F RND 3/8 FF (WOUND CARE) ×4 IMPLANT
DRAPE INCISE IOBAN 66X45 STRL (DRAPES) IMPLANT
DRAPE SLUSH/WARMER DISC (DRAPES) ×4 IMPLANT
DRAPE SURG IRRIG POUCH 19X23 (DRAPES) ×4 IMPLANT
DRILL BIT 2.2MM W/14M STOP (BIT) ×1 IMPLANT
DRSG TEGADERM 2-3/8X2-3/4 SM (GAUZE/BANDAGES/DRESSINGS) ×2 IMPLANT
ELECT BLADE 6.5 EXT (BLADE) IMPLANT
ELECT REM PT RETURN 9FT ADLT (ELECTROSURGICAL) ×8
ELECTRODE REM PT RTRN 9FT ADLT (ELECTROSURGICAL) ×6 IMPLANT
FELT TEFLON 6X6 (MISCELLANEOUS) ×4 IMPLANT
GAUZE SPONGE 4X4 12PLY STRL (GAUZE/BANDAGES/DRESSINGS) ×4 IMPLANT
GLOVE ECLIPSE 7.5 STRL STRAW (GLOVE) ×8 IMPLANT
GLOVE ECLIPSE 8.0 STRL XLNG CF (GLOVE) ×8 IMPLANT
GLOVE EUDERMIC 7 POWDERFREE (GLOVE) ×8 IMPLANT
GLOVE ORTHO TXT STRL SZ7.5 (GLOVE) ×8 IMPLANT
GOWN STRL REUS W/ TWL LRG LVL3 (GOWN DISPOSABLE) ×9 IMPLANT
GOWN STRL REUS W/ TWL XL LVL3 (GOWN DISPOSABLE) ×18 IMPLANT
GOWN STRL REUS W/TWL LRG LVL3 (GOWN DISPOSABLE) ×12
GOWN STRL REUS W/TWL XL LVL3 (GOWN DISPOSABLE) ×24
GUIDEWIRE ANGLED .035X150CM (WIRE) ×1 IMPLANT
GUIDEWIRE SAF TJ AMPL .035X180 (WIRE) ×5 IMPLANT
GUIDEWIRE WHOLEY HI TOR 145CM (WIRE) ×1 IMPLANT
HEMOSTAT POWDER SURGIFOAM 1G (HEMOSTASIS) IMPLANT
INSERT FOGARTY SM (MISCELLANEOUS) ×5 IMPLANT
KIT DILATOR VASC 18G NDL (KITS) ×1 IMPLANT
KIT HEART LEFT (KITS) ×1 IMPLANT
KIT ROOM TURNOVER OR (KITS) ×4 IMPLANT
KIT SUCTION CATH 14FR (SUCTIONS) ×12 IMPLANT
LEAD PACING MYOCARDI (MISCELLANEOUS) ×4 IMPLANT
NDL PERC 18GX7CM (NEEDLE) ×3 IMPLANT
NEEDLE 22X1 1/2 (OR ONLY) (NEEDLE) ×4 IMPLANT
NEEDLE PERC 18GX7CM (NEEDLE) ×12 IMPLANT
NS IRRIG 1000ML POUR BTL (IV SOLUTION) ×20 IMPLANT
PACK AORTA (CUSTOM PROCEDURE TRAY) ×4 IMPLANT
PAD ARMBOARD 7.5X6 YLW CONV (MISCELLANEOUS) ×8 IMPLANT
PAD ELECT DEFIB RADIOL ZOLL (MISCELLANEOUS) ×4 IMPLANT
PLATE UNIVERSAL 8 HOLE (Plate) ×1 IMPLANT
RETRACTOR TRL SOFT TISSUE LG (INSTRUMENTS) IMPLANT
RETRACTOR TRM SOFT TISSUE 7.5 (INSTRUMENTS) IMPLANT
SCREW SELF TAP MAT 2.9X14MM (Screw) ×10 IMPLANT
SHEATH PINNACLE 6F 10CM (SHEATH) ×4 IMPLANT
SLEEVE REPOSITIONING LENGTH 30 (MISCELLANEOUS) ×1 IMPLANT
SPONGE GAUZE 4X4 12PLY STER LF (GAUZE/BANDAGES/DRESSINGS) ×3 IMPLANT
SPONGE LAP 4X18 X RAY DECT (DISPOSABLE) ×4 IMPLANT
STOPCOCK MORSE 400PSI 3WAY (MISCELLANEOUS) ×6 IMPLANT
SUT BONE WAX W31G (SUTURE) ×4 IMPLANT
SUT ETHIBOND X763 2 0 SH 1 (SUTURE) ×8 IMPLANT
SUT GORETEX CV4 TH-18 (SUTURE) ×1 IMPLANT
SUT MNCRL AB 3-0 PS2 18 (SUTURE) ×8 IMPLANT
SUT PROLENE 2 0 MH 48 (SUTURE) ×12 IMPLANT
SUT PROLENE 4 0 RB 1 (SUTURE) ×4
SUT PROLENE 4-0 RB1 .5 CRCL 36 (SUTURE) ×3 IMPLANT
SUT PROLENE 5 0 C 1 36 (SUTURE) ×1 IMPLANT
SUT SILK  1 MH (SUTURE) ×1
SUT SILK 1 MH (SUTURE) ×3 IMPLANT
SUT SILK 2 0 SH CR/8 (SUTURE) ×4 IMPLANT
SUT VIC AB 1 CTX 36 (SUTURE) ×4
SUT VIC AB 1 CTX36XBRD ANBCTR (SUTURE) ×3 IMPLANT
SUT VIC AB 2-0 CTX 36 (SUTURE) ×9 IMPLANT
SUT VIC AB 3-0 SH 27 (SUTURE) ×4
SUT VIC AB 3-0 SH 27X BRD (SUTURE) IMPLANT
SUT VIC AB 3-0 SH 8-18 (SUTURE) ×8 IMPLANT
SUT VICRYL 2 TP 1 (SUTURE) IMPLANT
SYR 30ML LL (SYRINGE) ×8 IMPLANT
SYR 3ML LL SCALE MARK (SYRINGE) ×4 IMPLANT
SYR 50ML LL SCALE MARK (SYRINGE) ×4 IMPLANT
SYR 5ML LL (SYRINGE) ×4 IMPLANT
SYR MEDRAD MARK V 150ML (SYRINGE) ×4 IMPLANT
SYRINGE 10CC LL (SYRINGE) ×1 IMPLANT
SYSTEM SAHARA CHEST DRAIN ATS (WOUND CARE) ×4 IMPLANT
TAPE CLOTH SURG 6X10 WHT LF (GAUZE/BANDAGES/DRESSINGS) ×1 IMPLANT
TOWEL OR 17X26 10 PK STRL BLUE (TOWEL DISPOSABLE) ×8 IMPLANT
TRANSDUCER W/STOPCOCK (MISCELLANEOUS) ×2 IMPLANT
TRAY FOLEY IC TEMP SENS 16FR (CATHETERS) ×4 IMPLANT
TUBING ART PRESS 72  MALE/FEM (TUBING) ×1
TUBING ART PRESS 72 MALE/FEM (TUBING) IMPLANT
TUBING HIGH PRESSURE 120CM (CONNECTOR) ×4 IMPLANT
VALVE HRT TRANSCATH ASCEND 26M (Valve) ×1 IMPLANT
WIRE .035 3MM-J 145CM (WIRE) ×1 IMPLANT
WIRE HI TORQ VERSACORE J 260CM (WIRE) ×1 IMPLANT

## 2014-11-20 NOTE — Procedures (Signed)
Extubation Procedure Note  Patient Details:   Name: Ronald Bell DOB: 01-31-1941 MRN: [id number]   Airway Documentation:     Evaluation  O2 sats: stable throughout Complications: No apparent complications Patient did tolerate procedure well. Bilateral Breath Sounds: Expiratory wheezes Suctioning: Airway Yes   Patient extubated to 4L nasal cannula per rapid wean protocol.  NIF -28, VC 1L.  Positive cuff leak noted.  No evidence of stridor.  Patient able to speak post extubation.  Incentive spirometry performed x5 with achieved goal of 500.  Sats currently 97%.  Vitals are stable.  No apparent complications.  Philomena Doheny 11/20/2014, 4:25 PM

## 2014-11-20 NOTE — CV Procedure (Addendum)
[facility]  TAVR OPERATIVE NOTE   Date of Procedure:  11/20/2014  Preoperative Diagnosis: Ronald Aortic Stenosis   Postoperative Diagnosis: Same   Procedure:    Transcatheter Aortic Valve Replacement - TransApical Approach  Edwards Sapien XT (size 26 mm, model [id number]  serial # [id number])   Co-Surgeons:  Valentina Gu. Roxy Manns, MD and Lauree Chandler, MD  Assistants:   Gaye Pollack, MD   Anesthesiologist:  Lyda Perone  Echocardiographer:  Aundra Dubin  Pre-operative Echo Findings:  Ronald aortic stenosis  Normal left ventricular systolic function  Post-operative Echo Findings:  Trivial paravalvular leak  Normal left ventricular systolic function  BRIEF CLINICAL NOTE AND INDICATIONS FOR SURGERY  74 yo male with history of aortic stenosis, Ronald Bell, Ronald Bell, Ronald Bell, Ronald and anemia with prior GI bleeding who is here today for TAVR. He has been followed closely over the past year by Dr. Candee Furbish with known aortic valve stenosis. He has recently experienced dyspnea with exertion following left knee arthroscopy in December 2015. Follow up echocardiogram January 2016 with Ronald aortic stenosis with mean gradient of 34 mm Hg and peak gradient of 60 mm Hg. Aortic valve area was estimated at 0.8 cm2. LV function was normal with LVEF=55%. He was hospitalized in late January 2016 with hypoxic respiratory failure felt to be secondary to Bell exacerbation and was treated with bronchodilators, antibiotics and steroids. He was seen for hospital follow up Dr. Marlou Porch and was referred for pulmonary evaluation with Dr. [name] who felt that a component of his respiratory issues was likely due to congestive heart failure. He was then seen in the CT surgery office by Dr. Roxy Manns 06/21/14 for further evaluation of his aortic stenosis. He has since undergone a right and left heart catheterization per Dr. Marlou Porch on June 28, 2014 and was found to have heavily  calcified coronary arteries with Ronald Bell in the mid LAD, mid RCA and moderately Ronald Bell in the ostium of the Circumflex. Prior imaging confirms presence of Ronald calcification of the thoracic aorta. He has known PAD with occluded right iliac artery and moderate left iliac artery stenosis with prior stent placement. He has history of GI bleeding and was found to have angiodysplasia 7 years ago. He has been followed by GI and takes iron daily but no recent GI bleeding on ASA therapy. He smoked for many years and quit recently. He has stable Bell and does not require supplemental O2 therapy. I met him in April 2016 and we discussed cardiac cath with PCI and TAVR. In preparation for TAVR, I had planned PCI of the RCA with rotablator atherectomy in May 2016. He was started on Plavix and unfortunately developed GI bleeding. 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 Plavix was stopped and the AVMs were treated by GI. He has had no recurrent bleeding. PCI of RCA performed July 2016 with rotablator atherectomy and placement of bare metal stent in the mid RCA. He tolerated one month of Plavix therapy without GI bleeding. Plavix stopped last week for TAVR.   During the course of the patient's preoperative work up they have been evaluated comprehensively by a multidisciplinary team of specialists coordinated through the [facility] in the [facility].  They have been demonstrated to suffer from symptomatic Ronald aortic stenosis as noted above. The patient has been counseled extensively as to the relative risks and benefits of all options for the treatment of Ronald aortic stenosis including long  term medical therapy, conventional surgery for aortic valve replacement, and transcatheter aortic valve replacement.  The patient has been independently evaluated by two cardiac surgeons including Dr Roxy Manns and Dr. Cyndia Bent, and they are felt to be at high  risk for conventional surgical aortic valve replacement. Both surgeons indicated the patient would be a poor candidate for conventional surgery given calcified aorta, Ronald Bell, advanced age. Based upon review of all of the patient's preoperative diagnostic tests they are felt to be candidate for transcatheter aortic valve replacement using the tran-apical approach as an alternative to high risk conventional surgery.    Following the decision to proceed with transcatheter aortic valve replacement, a discussion has been held regarding what types of management strategies would be attempted intraoperatively in the event of life-threatening complications, including whether or not the patient would be considered a candidate for the use of cardiopulmonary bypass and/or conversion to open sternotomy for attempted surgical intervention.  The patient has been advised of a variety of complications that might develop peculiar to this approach including but not limited to risks of death, stroke, paravalvular leak, aortic dissection or other major vascular complications, aortic annulus rupture, device embolization, cardiac rupture or perforation, acute myocardial infarction, arrhythmia, heart block or bradycardia requiring permanent pacemaker placement, congestive heart failure, respiratory failure, renal failure, pneumonia, infection, other late complications related to structural valve deterioration or migration, or other complications that might ultimately cause a temporary or permanent loss of functional independence or other long term morbidity.  The patient provides full informed consent for the procedure as described and all questions were answered preoperatively.   DETAILS OF THE OPERATIVE PROCEDURE  PREPARATION:    The patient is brought to the operating room on the above mentioned date and central monitoring was established by the anesthesia team including placement of Swan-Ganz catheter and radial arterial  line. The patient is placed in the supine position on the operating table.  Intravenous antibiotics are administered. General endotracheal anesthesia is induced uneventfully. A Foley catheter is placed.  Baseline transesophageal echocardiogram was performed. The patient's chest, abdomen, both groins, and both lower extremities are prepared and draped in a sterile manner. A time out procedure is performed.   Ronald ACCESS:    Using the modified Seldinger technique, femoral arterial and venous access was obtained with placement of 6 Fr sheaths on the right side.  A pigtail diagnostic catheter was passed through the right femoral arterial sheath under fluoroscopic guidance into the aortic root.  A temporary transvenous pacemaker catheter was passed through the right femoral venous sheath under fluoroscopic guidance into the right ventricle.  The pacemaker was tested to ensure stable lead placement and pacemaker capture. Aortic root angiography was performed in order to determine the optimal angiographic angle for valve deployment.   TRANSAPICAL ACCESS:  Please see note by Dr. Roxy Manns describing the surgical incision. Trans-apical approach used. Needle through the apex followed by a J-wire, maneuvered into the descending aorta. 6 French sheath through the apex. Extra stiff wire into the descending aorta. 24 French sheath through the apex.  TRANSCATHETER HEART VALVE DEPLOYMENT:  An Edwards Sapien XT  Valve (size 26 mm) was prepared and crimped per manufacturer's guidelines, and the proper orientation of the valve is confirmed. Please see Dr. Guy Sandifer note outlining the procedure.   PROCEDURE COMPLETION:  The sheath was then removed and transapical access is closed by Dr. Roxy Manns.    The patient tolerated the procedure well and is transported to the surgical intensive care  in stable condition. There were no immediate intraoperative complications. All sponge instrument and needle counts are verified correct  at completion of the operation.   No blood products were administered during the operation.  The patient received a total of 91 mL of intravenous contrast during the procedure.  MCALHANY,CHRISTOPHER MD 11/20/2014 9:52 AM

## 2014-11-20 NOTE — Progress Notes (Signed)
Utilization Review Completed.Donne Anon T8/30/2016

## 2014-11-20 NOTE — Progress Notes (Signed)
  Echocardiogram Echocardiogram Transesophageal has been performed.  Jennette Dubin 11/20/2014, 10:37 AM

## 2014-11-20 NOTE — Progress Notes (Signed)
Dr. Roxy Manns called about gas, give 1 amp of bicarb and extubate, draw another gas 1 hr post extubation.  Rowe Pavy, RN

## 2014-11-20 NOTE — Op Note (Signed)
CARDIOTHORACIC SURGERY OPERATIVE NOTE  Date of Procedure:  11/20/2014  Preoperative Diagnosis: Severe Aortic Stenosis   Postoperative Diagnosis: Same   Procedure:    Transcatheter Aortic Valve Replacement - Transapical Approach  Edwards Sapien XT THV (size 26 mm, model # 9300TFX, serial # [id number])   Plating of Left 8th Rib    Co-Surgeons:  Clarence H. Roxy Manns, MD and Lauree Chandler, MD  Assistants:   Gaye Pollack, MD   Anesthesiologist:  Roberts Gaudy, MD  Echocardiographer:  Loralie Champagne, MD  Pre-operative Echo Findings:  Severe aortic stenosis  Moderate aortic insufficiency  Normal left ventricular systolic function  Post-operative Echo Findings:  Trivial paravalvular leak  Normal left ventricular systolic function   BRIEF CLINICAL NOTE AND INDICATIONS FOR SURGERY  Patient is a 74 year old white male with history of aortic stenosis, severe COPD, hypertension, peripheral vascular disease, hyperlipidemia, and iron deficient anemia with GI bleeding from angiodysplasia in the past who has been referred to discuss treatment options for management of severe symptomatic aortic stenosis. The patient states that he has been told he has a heart murmur for most of his life. He has been followed by Dr. Marlou Porch for the last several years with known history of aortic stenosis. An echocardiogram performed 01/29/2012 revealed what was felt to be moderate aortic stenosis with normal left ventricular systolic function based upon peak velocity across the aortic valve measured 3.4 m/s corresponding to a mean transvalvular gradient estimated at 28 mmHg. This past December the patient underwent left knee arthroscopy for torn cartilage under general anesthesia. The patient tolerated the procedure well, but within a few weeks he began to experience progressive symptoms of shortness of breath. Transthoracic echocardiogram performed 04/06/2014 revealed the presence of severe aortic  stenosis based upon peak velocity across the aortic valve that ranged between 3.5 and 3.8 m/s corresponding to peak-to-peak and mean transvalvular gradients estimated to be 60 and 34 mmHg, respectively. Aortic valve area was calculated to be 0.8 cm. Left ventricular systolic function remained preserved with ejection fraction estimated 55%. Shortly after that the patient was hospitalized on 04/16/2014 with acute hypoxic respiratory failure. He was initially treated using BiPAP and he recovered quickly. He was diagnosed with presumed flare of COPD. Symptoms continued to improve with bronchodilators, antibiotics, and steroids. He was seen in follow-up shortly after hospital discharge by Dr. Marlou Porch who felt concerned that the patient's aortic stenosis might be contributing to his problems with dyspnea. The patient was referred for elective surgical consultation at that time, but he decided to postpone consultation until after he had been seen in follow-up by his pulmonologist, Dr. [name]. On 05/31/2014 the patient developed another acute exacerbation of resting shortness of breath and dry non-productive cough for which he was briefly hospitalized. He was again treated for presumed flare of COPD and discharged from the hospital. The patient was seen in follow-up by Dr. [name] on 06/04/2014 who felt concerned that the patient's symptoms of progressive shortness of breath with orthopnea and PND sounded concerning for likely congestive heart failure. The patient was referred for elective surgical consultation.  He was originally seen in consultation on 06/21/2014. Since then he has been evaluated extensively by a multidisciplinary team of specialists. He is felt to be of poor candidate for conventional surgery because of numerous comorbid medical problems as well as severe calcification of the entire thoracic aorta. He underwent diagnostic cardiac catheterization by Dr. Marlou Porch on 06/28/2014 confirming the  presence of severe aortic stenosis with mean transvalvular gradient measured  41.2 mmHg at catheterization. Aortic valve area was calculated 0.67 cm. Coronary arteriography revealed the presence of severe multivessel coronary artery disease with high-grade 90% stenosis of the mid right coronary artery. Pulmonary artery pressures were mildly elevated. He was scheduled for PCI and stenting of his right coronary artery, but this was delayed because of problems with GI bleeding. He underwent EGD and colonoscopy demonstrating multiple angiodysplasias requiring ablation. He eventually underwent PCI and stenting of the right coronary artery using a bare-metal stent on 10/19/2014. He has completed a 1 month course of dual antiplatelet therapy and his hemoglobin has remained stable. The patient provides full informed consent for the procedure as described and all questions were answered preoperatively.    DETAILS OF THE OPERATIVE PROCEDURE  PREPARATION:    The patient is brought to the operating room on the above mentioned date and central monitoring was established by the anesthesia team including placement of Swan-Ganz catheter and radial arterial line. The patient is placed in the supine position on the operating table.  Intravenous antibiotics are administered. General endotracheal anesthesia is induced uneventfully. A Foley catheter is placed.  Baseline transesophageal echocardiogram was performed.  Findings were notable for severe aortic stenosis, moderate aortic insufficiency, normal LV systolic function, and mild mitral regurgitation.  The patient's chest, abdomen, both groins, and both lower extremities are prepared and draped in a sterile manner. A time out procedure is performed.   PERIPHERAL ACCESS:    Femoral arterial and venous access is obtained with placement of 6 Fr arterial and venous sheaths on the right side.  A second venous sheath was placed on the left.  A pigtail diagnostic  catheter is passed through the right femoral arterial sheath under fluoroscopic guidance into the aortic root.  A temporary transvenous pacemaker catheter is passed through the right femoral venous sheath under fluoroscopic guidance into the right ventricle.  The pacemaker is tested to ensure stable lead placement and pacemaker capture.   TRANSAPICAL ACCESS:   The location of the left ventricular apex is confirmed using flouroscopy, and a miniature left thoracotomy incision is made directly over the left ventricular apex.  The anterior left 7th and 8th ribs were fused and subsequently divided with electrocautery.  The left pleural space is entered.  No adhesions are encountered.  A soft tissue retractor is placed and the ribs gently spread to expose the pericardial surface.  A longitudinal incision is made in the pericardium and the left ventricular apex inspected.  There are no intrapericardial adhesions.  An appropriate site for left ventricular sheath placement is chosen well lateral to the left anterior descending coronary artery and verified using TEE.  Two pursestring sutures are placed using pledgeted 2-0 Prolene suture in a double triangle orientation.   The patient is heparinized systemically and ACT verified > 250 seconds.  The left ventricular apex is punctured using an 18 gauge needle and a soft J-tipped guidewire is passed into the left ventricle and through the aortic valve under fluoroscopic guidance while also continuously monitoring TEE for signs of entrapment in the mitral apparatus.  A 6 Fr sheath is placed over the guidewire and across the aortic valve.  A JR-4 catheter is passed through the sheath and maneuvered around the aortic arch into the descending thoracic aorta under fluoroscopic guidance.  An Amplatz extra stiff guidewire is passed through the JR-4 catheter into the descending thoracic aorta and both the JR-4 catheter and the introducing sheath are removed.  A  24 [place]  Ascendra 3 introducer sheath is passed over the guidewire into the left ventricle.  The sheath position is continuously monitored and secured by the surgical assistant.  Baseline simultaneous left ventricular and aortic pressures are recorded.   TRANSCATHETER HEART VALVE DEPLOYMENT:  An Berniece Pap XT transcatheter heart valve (size 26 mm, model # 9300TFX, serial #[id number]) is prepared and crimped per manufacturer's guidelines, and the proper orientation of the valve is confirmed on the [employer] 3 delivery system.  The delivery system loader is advanced into the introducing sheath.  The valve and delivery system are advanced through the loader into the sheath and all air is evacuated.  The valve and balloon are advanced into the left ventricle and part way through the aortic valve.  The valve pusher is retracted.  The valve is then finely positioned in the aortic valve and its position verified using an aortogram while temporarily holding ventilation and rapid pacing.  Valve position is also confirmed using TEE, and care is taken to make certain there is no sign of entanglement in the mitral apparatus.  Once final position of the valve has been confirmed, the valve is deployed while temporarily holding ventilation and during rapid ventricular pacing to maintain systolic blood pressure < 50 mmHg and pulse pressure < 10 mmHg.  The balloon inflation is held for >3 seconds after reaching full deployment volume.  Once the balloon has fully deflated the balloon is retracted into the left ventricle and valve function is assessed using TEE.   There is felt to be trivial paravalvular leak and no central aortic insufficiency.  Left ventricular function is unchanged from preoperatively.  There remains only mild mitral regurgitation.  The patient's hemodynamic recovery following valve deployment is rapid and uneventful.  The deployment balloon and guidewire are both removed and simultaneous left ventricular  and aortic pressures are recorded.  The left ventricular sheath is removed during rapid ventricular pacing to maintain systolic blood pressure < 70 mmHg while the pursestring sutures are tied.  The apical closure is inspected and notably intact.  Protamine is administered.    Once hemostasis has been ascertained, the left pleural space is drained using a single 28 Fr Bard chest tube.   RIB PLATING:  After the many thoracotomy retractor was removed it was apparent that the eighth rib was broken in 2 places. Subsequently the rib was plated using a pair of Synthes plates. The seventh and eighth ribs were then reapproximated using paracostal sutures.   PROCEDURE COMPLETION:  The mini thoracotomy incision is closed in layers and the skin incision closed using a subcuticular skin closure.  The temporary pacemaker, pigtail catheters and all femoral sheaths are removed.  The patient tolerated the procedure well and is transported to the surgical intensive care in stable condition. There are no intraoperative complications. All sponge instrument and needle counts are verified correct at completion of the operation.  The patient received a total of 91 mL of intravenous contrast during the procedure.  The patient received 1 unit packed red blood cells during the procedure due to anemia which was present preoperatively and exacerbated by acute blood loss.     Valentina Gu. Roxy Manns MD 11/20/2014 10:35 AM

## 2014-11-20 NOTE — Progress Notes (Signed)
[facility] BRIEF SICU PROGRESS NOTE  Day of Surgery  S/P Procedure(s) (LRB): TRANSCATHETER AORTIC VALVE REPLACEMENT, TRANSAPICAL (N/A) TRANSESOPHAGEAL ECHOCARDIOGRAM (TEE) (N/A) RIB PLATING OF LEFT 8TH RIB (Left)   Extubated uneventfully approx 5 pm.  Awake and alert.  Feels well except mild soreness in chest.  Somewhat fidgety.  Gets dyspneic and tachypneic with activity, but breathing comfortably at present  NSR w/ stable hemodynamics O2 sats 96% on 6 L/min UOP adequate Labs okay  Plan: Continue nebs and pulm toilet.  One dose lasix to stimulate diuresis.  Watch resp status closely.  Ronald Bell 11/20/2014 7:51 PM

## 2014-11-20 NOTE — Progress Notes (Signed)
Patient moves all extremities, follows commands as far as grips, wiggling toes, however, patient becomes very agitated, labored breathing, BP increases 160s, unable to relax after reassurance. Called Dr. Roxy Manns in the OR to update patients status. Dr. Roxy Manns will come check on patient following surgery, orders to stop wean for now.  Rowe Pavy, RN

## 2014-11-20 NOTE — Progress Notes (Signed)
Patient woke up very agitated, 2 of versed given and RT called to put patient back on a rate.  Rowe Pavy, RN

## 2014-11-20 NOTE — Anesthesia Procedure Notes (Addendum)
Procedure Name: Intubation Date/Time: 11/20/2014 7:56 AM Performed by: Barrington Ellison Pre-anesthesia Checklist: Patient identified, Emergency Drugs available, Suction available, Patient being monitored and Timeout performed Patient Re-evaluated:Patient Re-evaluated prior to inductionOxygen Delivery Method: Circle system utilized Preoxygenation: Pre-oxygenation with 100% oxygen Intubation Type: IV induction Ventilation: Mask ventilation without difficulty Laryngoscope Size: Mac and 4 Grade View: Grade I Tube type: Oral Tube size: 8.0 mm Number of attempts: 1 Airway Equipment and Method: Stylet and Oral airway Placement Confirmation: ETT inserted through vocal cords under direct vision,  positive ETCO2 and breath sounds checked- equal and bilateral Secured at: 21 cm Tube secured with: Tape Dental Injury: Teeth and Oropharynx as per pre-operative assessment  Comments: Intubated by Harless Nakayama, SRNA with CRNA and MDA supervision

## 2014-11-20 NOTE — Progress Notes (Signed)
Dr. Roxy Manns at bedside, orders written for patient to receive nebs and proceed with rapid wean protocol, leave patient on precedex for agitation during wean process.  Rowe Pavy, RN

## 2014-11-20 NOTE — Anesthesia Postprocedure Evaluation (Signed)
  Anesthesia Post-op Note  Patient: Ronald Bell  Procedure(s) Performed: Procedure(s): TRANSCATHETER AORTIC VALVE REPLACEMENT, TRANSAPICAL (N/A) TRANSESOPHAGEAL ECHOCARDIOGRAM (TEE) (N/A) RIB PLATING OF LEFT 8TH RIB (Left)  Patient Location: SICU  Anesthesia Type:General  Level of Consciousness: awake, alert  and oriented  Airway and Oxygen Therapy: Patient Spontanous Breathing and Patient connected to nasal cannula oxygen  Post-op Pain: none  Post-op Assessment: Post-op Vital signs reviewed, Patient's Cardiovascular Status Stable, Respiratory Function Stable, No signs of Nausea or vomiting and Pain level controlled              Post-op Vital Signs: stable  Last Vitals:  Filed Vitals:   11/20/14 1700  BP: 125/40  Pulse: 86  Temp:   Resp: 35    Complications: No apparent anesthesia complications

## 2014-11-20 NOTE — Interval H&P Note (Signed)
History and Physical Interval Note:  11/20/2014 7:21 AM  Ronald Bell  has presented today for surgery, with the diagnosis of SEVERE AS  The various methods of treatment have been discussed with the patient and family. After consideration of risks, benefits and other options for treatment, the patient has consented to  Procedure(s): TRANSCATHETER AORTIC VALVE REPLACEMENT, TRANSAPICAL (N/A) TRANSESOPHAGEAL ECHOCARDIOGRAM (TEE) (N/A) as a surgical intervention .  The patient's history has been reviewed, patient examined, no change in status, stable for surgery.  I have reviewed the patient's chart and labs.  Questions were answered to the patient's satisfaction.     [name]

## 2014-11-20 NOTE — Transfer of Care (Signed)
Immediate Anesthesia Transfer of Care Note  Patient: Ronald Bell  Procedure(s) Performed: Procedure(s): TRANSCATHETER AORTIC VALVE REPLACEMENT, TRANSAPICAL (N/A) TRANSESOPHAGEAL ECHOCARDIOGRAM (TEE) (N/A) RIB PLATING OF LEFT 8TH RIB (Left)  Patient Location: ICU  Anesthesia Type:General  Level of Consciousness: Patient remains intubated per anesthesia plan  Airway & Oxygen Therapy: Patient remains intubated per anesthesia plan and Patient placed on Ventilator (see vital sign flow sheet for setting)  Post-op Assessment: Report given to RN  Post vital signs: Reviewed and stable  Last Vitals:  Filed Vitals:   11/20/14 0708  BP:   Pulse:   Temp:   Resp: 18    Complications: No apparent anesthesia complications

## 2014-11-20 NOTE — Progress Notes (Signed)
While getting patient up to chair patient became very SOB, labored breathing, RR 30s-40, O2 sats 70s-80, nasal canula increased to 6L, O2 sats now 97-100, RR 25, will continue to monitor and assess patient.   Rowe Pavy, RN

## 2014-11-21 ENCOUNTER — Other Ambulatory Visit: Payer: Self-pay | Admitting: *Deleted

## 2014-11-21 ENCOUNTER — Encounter (HOSPITAL_COMMUNITY): Payer: Self-pay | Admitting: Thoracic Surgery (Cardiothoracic Vascular Surgery)

## 2014-11-21 ENCOUNTER — Inpatient Hospital Stay (HOSPITAL_COMMUNITY): Payer: Medicare Other

## 2014-11-21 DIAGNOSIS — Z952 Presence of prosthetic heart valve: Secondary | ICD-10-CM

## 2014-11-21 DIAGNOSIS — I350 Nonrheumatic aortic (valve) stenosis: Secondary | ICD-10-CM

## 2014-11-21 DIAGNOSIS — I359 Nonrheumatic aortic valve disorder, unspecified: Secondary | ICD-10-CM

## 2014-11-21 DIAGNOSIS — Z954 Presence of other heart-valve replacement: Secondary | ICD-10-CM

## 2014-11-21 LAB — CBC
HCT: 31.7 % — ABNORMAL LOW (ref 39.0–52.0)
HEMATOCRIT: 32 % — AB (ref 39.0–52.0)
HEMOGLOBIN: 10.1 g/dL — AB (ref 13.0–17.0)
Hemoglobin: 10.3 g/dL — ABNORMAL LOW (ref 13.0–17.0)
MCH: 30.8 pg (ref 26.0–34.0)
MCH: 31.4 pg (ref 26.0–34.0)
MCHC: 31.9 g/dL (ref 30.0–36.0)
MCHC: 32.2 g/dL (ref 30.0–36.0)
MCV: 96.6 fL (ref 78.0–100.0)
MCV: 97.6 fL (ref 78.0–100.0)
PLATELETS: 265 10*3/uL (ref 150–400)
Platelets: 254 10*3/uL (ref 150–400)
RBC: 3.28 MIL/uL — ABNORMAL LOW (ref 4.22–5.81)
RBC: 3.28 MIL/uL — ABNORMAL LOW (ref 4.22–5.81)
RDW: 16.1 % — AB (ref 11.5–15.5)
RDW: 16 % — AB (ref 11.5–15.5)
WBC: 12.8 10*3/uL — ABNORMAL HIGH (ref 4.0–10.5)
WBC: 16.3 10*3/uL — AB (ref 4.0–10.5)

## 2014-11-21 LAB — POCT I-STAT, CHEM 8
BUN: 13 mg/dL (ref 6–20)
CHLORIDE: 101 mmol/L (ref 101–111)
Calcium, Ion: 1.15 mmol/L (ref 1.13–1.30)
Creatinine, Ser: 0.9 mg/dL (ref 0.61–1.24)
GLUCOSE: 115 mg/dL — AB (ref 65–99)
HCT: 33 % — ABNORMAL LOW (ref 39.0–52.0)
Hemoglobin: 11.2 g/dL — ABNORMAL LOW (ref 13.0–17.0)
POTASSIUM: 4.7 mmol/L (ref 3.5–5.1)
Sodium: 134 mmol/L — ABNORMAL LOW (ref 135–145)
TCO2: 24 mmol/L (ref 0–100)

## 2014-11-21 LAB — POCT I-STAT 7, (LYTES, BLD GAS, ICA,H+H)
ACID-BASE DEFICIT: 3 mmol/L — AB (ref 0.0–2.0)
BICARBONATE: 24.9 meq/L — AB (ref 20.0–24.0)
CALCIUM ION: 1.28 mmol/L (ref 1.13–1.30)
HCT: 25 % — ABNORMAL LOW (ref 39.0–52.0)
Hemoglobin: 8.5 g/dL — ABNORMAL LOW (ref 13.0–17.0)
O2 SAT: 100 %
PH ART: 7.264 — AB (ref 7.350–7.450)
PO2 ART: 336 mmHg — AB (ref 80.0–100.0)
Potassium: 4.2 mmol/L (ref 3.5–5.1)
Sodium: 138 mmol/L (ref 135–145)
TCO2: 27 mmol/L (ref 0–100)
pCO2 arterial: 54.1 mmHg — ABNORMAL HIGH (ref 35.0–45.0)

## 2014-11-21 LAB — BASIC METABOLIC PANEL
Anion gap: 10 (ref 5–15)
BUN: 11 mg/dL (ref 6–20)
CALCIUM: 8 mg/dL — AB (ref 8.9–10.3)
CHLORIDE: 99 mmol/L — AB (ref 101–111)
CO2: 24 mmol/L (ref 22–32)
CREATININE: 0.85 mg/dL (ref 0.61–1.24)
Glucose, Bld: 115 mg/dL — ABNORMAL HIGH (ref 65–99)
Potassium: 3.6 mmol/L (ref 3.5–5.1)
SODIUM: 133 mmol/L — AB (ref 135–145)

## 2014-11-21 LAB — GLUCOSE, CAPILLARY
GLUCOSE-CAPILLARY: 106 mg/dL — AB (ref 65–99)
GLUCOSE-CAPILLARY: 140 mg/dL — AB (ref 65–99)
Glucose-Capillary: 126 mg/dL — ABNORMAL HIGH (ref 65–99)
Glucose-Capillary: 135 mg/dL — ABNORMAL HIGH (ref 65–99)

## 2014-11-21 LAB — MAGNESIUM
MAGNESIUM: 2.1 mg/dL (ref 1.7–2.4)
Magnesium: 2.3 mg/dL (ref 1.7–2.4)

## 2014-11-21 LAB — CREATININE, SERUM
Creatinine, Ser: 0.91 mg/dL (ref 0.61–1.24)
GFR calc non Af Amer: >60 mL/min (ref >60)

## 2014-11-21 LAB — POCT I-STAT GLUCOSE
Glucose, Bld: 120 mg/dL — ABNORMAL HIGH (ref 65–99)
Operator id: 284731

## 2014-11-21 MED ORDER — MORPHINE SULFATE (PF) 2 MG/ML IV SOLN
2.0000 mg | INTRAVENOUS | Status: DC | PRN
  Administered 2014-11-21 – 2014-11-23 (×6): 2 mg via INTRAVENOUS
  Filled 2014-11-21 (×6): qty 1

## 2014-11-21 MED ORDER — ASPIRIN EC 81 MG PO TBEC
81.0000 mg | DELAYED_RELEASE_TABLET | Freq: Every day | ORAL | Status: DC
  Filled 2014-11-21: qty 1

## 2014-11-21 MED ORDER — POTASSIUM CHLORIDE 10 MEQ/50ML IV SOLN
10.0000 meq | INTRAVENOUS | Status: AC
Start: 2014-11-21 — End: 2014-11-21
  Administered 2014-11-21 (×3): 10 meq via INTRAVENOUS
  Filled 2014-11-21 (×3): qty 50

## 2014-11-21 MED ORDER — ATORVASTATIN CALCIUM 40 MG PO TABS
40.0000 mg | ORAL_TABLET | Freq: Every day | ORAL | Status: DC
Start: 2014-11-21 — End: 2014-11-27
  Administered 2014-11-21 – 2014-11-27 (×7): 40 mg via ORAL
  Filled 2014-11-21 (×7): qty 1

## 2014-11-21 MED ORDER — FLUTICASONE FUROATE-VILANTEROL 100-25 MCG/INH IN AEPB: 1.0000 | INHALATION_SPRAY | Freq: Every day | RESPIRATORY_TRACT | Status: DC

## 2014-11-21 MED ORDER — MOMETASONE FURO-FORMOTEROL FUM 100-5 MCG/ACT IN AERO
2.0000 | INHALATION_SPRAY | Freq: Two times a day (BID) | RESPIRATORY_TRACT | Status: DC
  Administered 2014-11-21 – 2014-11-25 (×8): 2 via RESPIRATORY_TRACT
  Filled 2014-11-21 (×2): qty 8.8

## 2014-11-21 MED ORDER — ASPIRIN EC 81 MG PO TBEC
81.0000 mg | DELAYED_RELEASE_TABLET | Freq: Every day | ORAL | Status: DC
  Administered 2014-11-21 – 2014-11-27 (×7): 81 mg via ORAL
  Filled 2014-11-21 (×7): qty 1

## 2014-11-21 NOTE — Progress Notes (Signed)
     SUBJECTIVE: Chest wall sore.   BP 100/48 mmHg  Pulse 109  Temp(Src) 99.5 F (37.5 C) (Oral)  Resp 27  Ht 5\' 3"  (1.6 m)  Wt 141 lb 12.1 oz (64.3 kg)  BMI 25.12 kg/m2  SpO2 96%  Intake/Output Summary (Last 24 hours) at 11/21/14 0831 Last data filed at 11/21/14 0800  Gross per 24 hour  Intake 5204.37 ml  Output   3905 ml  Net 1299.37 ml    PHYSICAL EXAM General: Well developed, well nourished, in no acute distress. Alert and oriented x 3.  Psych:  Good affect, responds appropriately Neck: No JVD. No masses noted.  Lungs: Clear bilaterally with no wheezes or rhonci noted.  Heart: RRR with no murmurs noted. Abdomen: Bowel sounds are present. Soft, non-tender.  Extremities: No lower extremity edema.   LABS: Basic Metabolic Panel:  Recent Labs  11/20/14 1700 11/20/14 1719 11/21/14 0457  NA  --  138 133*  K  --  4.1 3.6  CL  --  104 99*  CO2  --   --  24  GLUCOSE  --  109* 115*  BUN  --  17 11  CREATININE 0.91 0.90 0.85  CALCIUM  --   --  8.0*  MG 2.8*  --  2.1   CBC:  Recent Labs  11/20/14 1700 11/20/14 1719 11/21/14 0457  WBC 10.4  --  12.8*  HGB 9.8* 10.2* 10.1*  HCT 30.1* 30.0* 31.7*  MCV 96.8  --  96.6  PLT 238  --  265   Current Meds: . acetaminophen  1,000 mg Oral 4 times per day  . antiseptic oral rinse  7 mL Mouth Rinse QID  . aspirin EC  81 mg Oral Daily  . atorvastatin  40 mg Oral Daily  . cefUROXime (ZINACEF)  IV  1.5 g Intravenous Q12H  . chlorhexidine gluconate  15 mL Mouth Rinse BID  . clopidogrel  75 mg Oral Q breakfast  . famotidine (PEPCID) IV  20 mg Intravenous Q12H  . insulin aspart  0-24 Units Subcutaneous 6 times per day  . ipratropium-albuterol  3 mL Nebulization Q6H  . metoprolol tartrate  12.5 mg Oral BID  . mometasone-formoterol  2 puff Inhalation BID  . pantoprazole  40 mg Oral Daily  . potassium chloride  10 mEq Intravenous Q1 Hr x 3  . sodium chloride  3 mL Intravenous Q12H    ASSESSMENT AND PLAN:  1. Severe  aortic valve stenosis: POD #1 s/p TAVR. He is stable, still slightly hypotensive. Still on Neosynephrine drip with plans to wean today. Echo today to assess valve.   2. Acute diastolic CHF: Diuresis this am for mild volume overload.   MCALHANY,CHRISTOPHER  8/31/20168:31 AM

## 2014-11-21 NOTE — Progress Notes (Signed)
Patient ID: Ronald Bell, male   DOB: Aug 02, 1940, 74 y.o.   MRN: [id number]  SICU Evening Rounds:  He was off neo today but dropped pressure and it was turned back on. Now on 25 mcg.   Awake and alert, seems to be breathing ok.  Ambulated today.  Urine output ok.

## 2014-11-21 NOTE — Progress Notes (Signed)
Anesthesiology Follow-up:  POD #1 S/P TAVR. Now sitting in chair neuro intact complaining of shortness of breath, having mild labored breathing. Complaining of L. chest incisional pain, hemodynamically stable.  VS: T- 37.5 BP- 87/39 HR-98 (SR) RR-25 O2 sat 96% on 4L [place]  K-3.6 BUN/Cr. 11/0.85 H/H- 10.1/31.7 Platelets- 69,65  73 year old male with severe COPD, S/P transapical TAVR yesterday, case uneventful, extubated 6 1/2 hours post-op. Having respiratory difficulties due to post-op pain superimposed on underlying COPD, otherwise doing well.  Ronald Bell

## 2014-11-21 NOTE — Care Management Note (Addendum)
Case Management Note  Patient Details  Name: Ronald Bell MRN: [id number] Date of Birth: 1940-04-07  Subjective/Objective:     Per PT, pt will need ST-SNF for rehab, lives with spouse and she will be able to assist upon discharge.                             Expected Discharge Plan:  Skilled Nursing Facility  In-House Referral:  Clinical Social Work  Discharge planning Services  CM Consult  Status of Service:  In process, will continue to follow   Girard Cooter, RN 11/21/2014, 11:00 AM  11/21/14   2:48 PM   Met with son, [place], and dtr-in-law from [name].  Discussed ST-SNF for rehab and they are in agreement  - state they are relieved that he has that option as they were concerned that his wife may not be able to assist him if he was not fully independent @ discharge.

## 2014-11-21 NOTE — Progress Notes (Addendum)
[address]       [place], [address]             [phone]        CARDIOTHORACIC SURGERY PROGRESS NOTE   R1 Day Post-Op Procedure(s) (LRB): TRANSCATHETER AORTIC VALVE REPLACEMENT, TRANSAPICAL (N/A) TRANSESOPHAGEAL ECHOCARDIOGRAM (TEE) (N/A) RIB PLATING OF LEFT 8TH RIB (Left)  Subjective: Looks good.  Sore in chest.  Mild SOB  Objective: Vital signs: BP Readings from Last 1 Encounters:  11/21/14 100/48   Pulse Readings from Last 1 Encounters:  11/21/14 109   Resp Readings from Last 1 Encounters:  11/21/14 27   Temp Readings from Last 1 Encounters:  11/21/14 99.5 F (37.5 C) Oral    Hemodynamics: PAP: (40-60)/(21-31) 60/31 mmHg CO:  [3.3 L/min-4 L/min] 3.3 L/min CI:  [2 L/min/m2-2.3 L/min/m2] 2 L/min/m2  Physical Exam:  Rhythm:   sinus  Breath sounds: Few expiratory wheezes  Heart sounds:  RRR  Incisions:  Clean and dry  Abdomen:  Soft, non-distended, non-tender  Extremities:  Warm, well-perfused  Chest tubes:  Very low volume thin serosanguinous output, no air leak    Intake/Output from previous day: 08/30 0701 - 08/31 0700 In: 5353.1 [I.V.:3868.1; Blood:335; IV Piggyback:1150] Out: 6045 [Urine:3335; Blood:200; Chest Tube:400] Intake/Output this shift: Total I/O In: 50 [IV Piggyback:50] Out: -   Lab Results:  CBC: Recent Labs  11/20/14 1700 11/20/14 1719 11/21/14 0457  WBC 10.4  --  12.8*  HGB 9.8* 10.2* 10.1*  HCT 30.1* 30.0* 31.7*  PLT 238  --  265    BMET:  Recent Labs  11/20/14 1719 11/21/14 0457  NA 138 133*  K 4.1 3.6  CL 104 99*  CO2  --  24  GLUCOSE 109* 115*  BUN 17 11  CREATININE 0.90 0.85  CALCIUM  --  8.0*     PT/INR:   Recent Labs  11/20/14 1135  LABPROT 18.4*  INR 1.52*    CBG (last 3)   Recent Labs  11/20/14 2057 11/20/14 2349 11/21/14 0425  GLUCAP 106* 118* 106*    ABG    Component Value Date/Time   PHART 7.425 11/20/2014 2352   PCO2ART 42.2 11/20/2014 2352   PO2ART  69.0* 11/20/2014 2352   HCO3 27.5* 11/20/2014 2352   TCO2 29 11/20/2014 2352   ACIDBASEDEF 2.0 11/20/2014 1715   O2SAT 94.0 11/20/2014 2352    CXR: PORTABLE CHEST - 1 VIEW  COMPARISON: Portable chest x-ray of November 20, 2014  FINDINGS: The lungs are well-expanded. The interstitial markings remain coarse but are less conspicuous. Stable nodularity in the right lower lung projecting over the posterior aspect of the eighth rib is stable. There is stable subsegmental atelectasis in the left mid lung. There is no significant pleural effusion. The cardiac silhouette remains enlarged. The pulmonary vascularity is mildly prominent centrally.  There has been interval extubation of the trachea and esophagus. The Swan-Ganz catheter has been removed. The right internal jugular Cordis sheath as well as adjacent venous catheter are stable in position. The left chest tube is stable in appearance lying just inferior to the posterior aspect of the fifth rib.  IMPRESSION: Interval removal of the endotracheal and esophagogastric tubes as well Swan-Ganz catheter. Otherwise stable low-grade CHF superimposed upon COPD.   Electronically Signed  By: David Martinique M.D.  On: 11/21/2014 07:14  Assessment/Plan: S/P Procedure(s) (LRB): TRANSCATHETER AORTIC VALVE REPLACEMENT, TRANSAPICAL (N/A) TRANSESOPHAGEAL ECHOCARDIOGRAM (TEE) (N/A) RIB PLATING OF LEFT 8TH RIB (Left)  Overall doing well POD1 Maintaining NSR w/ stable BP although currently on low dose Neo drip Resp status stable w/ O2 sats 100% on 5 L/min and CXR clear, although some wheezing on exam Acute exacerbation of chronic diastolic CHF Severe COPD Chronic iron-deficient anemia w/ history of recurrent GI bleeding due to angiodysplasia Severe PVD   Wean Neo as tolerated  D/C chest tube  Mobilize  Pulm toilet   Nebs  Gentle diuresis once stable off Neo drip  Routine POD1 ECHO today  Keep in SICU for now to monitor  resp status and BP  I spent in excess of 15 minutes during the conduct of this hospital encounter and >50% of this time involved direct face-to-face encounter with the patient for counseling and/or coordination of their care.   Ronald Bell 11/21/2014 7:36 AM

## 2014-11-21 NOTE — Addendum Note (Signed)
Addendum  created 11/21/14 3810 by Roberts Gaudy, MD   Modules edited: Notes Section   Notes Section:  File: [id number]; Pend: [id number]; Pend: [id number]; Pend: [id number]

## 2014-11-21 NOTE — Evaluation (Addendum)
Physical Therapy Evaluation Patient Details Name: Ronald Bell MRN: [id number] DOB: [birth year] Today's Date: 11/21/2014   History of Present Illness  Pt is a 74 y/o male who presents with SOB, fatigue and severe aortic stenosis. Pt underwent TAVR procedure and subsequent 8th rib plating due to fracture during procedure, on 11/20/14.  Clinical Impression  Pt admitted with above diagnosis. Pt currently with functional limitations due to the deficits listed below (see PT Problem List). At the time of PT eval pt was able to perform transfers and minimal ambulation with RW and min assist +2 for balance/safety. Pt will benefit from skilled PT to increase their independence and safety with mobility to allow discharge to the venue listed below.    Note: During light mobility, RR increased as high as 43 breaths/min. Pt was cued for pursed-lip breathing and O2 sats decreased down into the high 60's - however, waveform was not uniform and this may have been inaccurate.      Follow Up Recommendations SNF;Supervision/Assistance - 24 hour    Equipment Recommendations  Rolling walker with 5" wheels;3in1 (PT)    Recommendations for Other Services       Precautions / Restrictions Precautions Precautions: Fall Precaution Comments: Chest tube L Restrictions Weight Bearing Restrictions: No      Mobility  Bed Mobility               General bed mobility comments: Pt sitting up in recliner upon PT arrival.   Transfers Overall transfer level: Needs assistance Equipment used: Rolling walker (2 wheeled) Transfers: Sit to/from Stand Sit to Stand: Min assist;+2 physical assistance;+2 safety/equipment         General transfer comment: +2 assist to power-up to full standing position. Pt required frequent cues for use of heart pillow for comfort.   Ambulation/Gait Ambulation/Gait assistance: Min assist Ambulation Distance (Feet): 18 Feet Assistive device: Rolling walker (2  wheeled) Gait Pattern/deviations: Decreased stride length;Shuffle;Trunk flexed Gait velocity: Decreased Gait velocity interpretation: Below normal speed for age/gender General Gait Details: Pt states he does not feel like he can walk right now, however was willing to walk 3 feet forward and backwards from chair. Repeated x3. Min assist for balance/support.  Stairs            Wheelchair Mobility    Modified Rankin (Stroke Patients Only)       Balance Overall balance assessment: Needs assistance Sitting-balance support: Feet supported;No upper extremity supported Sitting balance-Leahy Scale: Fair     Standing balance support: Bilateral upper extremity supported Standing balance-Leahy Scale: Poor Standing balance comment: Requires UE support to maintain standing balance at this time.                              Pertinent Vitals/Pain Pain Assessment: Faces Faces Pain Scale: Hurts little more Pain Location: Ribs/chest Pain Descriptors / Indicators: Operative site guarding Pain Intervention(s): Limited activity within patient's tolerance;Monitored during session;Repositioned    Home Living Family/patient expects to be discharged to:: Private residence Living Arrangements: Spouse/significant other Available Help at Discharge: Family;Available 24 hours/day Type of Home: House Home Access: Stairs to enter   [employer] of Steps: 2 Home Layout: One level Home Equipment: Cane - single point      Prior Function Level of Independence: Independent with assistive device(s)               Hand Dominance   Dominant Hand: Right    Extremity/Trunk Assessment  Upper Extremity Assessment: Defer to OT evaluation           Lower Extremity Assessment: Generalized weakness      Cervical / Trunk Assessment: Normal  Communication   Communication: No difficulties  Cognition Arousal/Alertness: Awake/alert Behavior During Therapy:  Anxious Overall Cognitive Status: Within Functional Limits for tasks assessed                      General Comments      Exercises        Assessment/Plan    PT Assessment Patient needs continued PT services  PT Diagnosis Difficulty walking;Generalized weakness;Acute pain   PT Problem List Decreased strength;Decreased range of motion;Decreased activity tolerance;Decreased balance;Decreased mobility;Decreased knowledge of use of DME;Decreased safety awareness;Decreased knowledge of precautions;Cardiopulmonary status limiting activity;Pain  PT Treatment Interventions DME instruction;Gait training;Stair training;Functional mobility training;Therapeutic activities;Therapeutic exercise;Neuromuscular re-education;Patient/family education   PT Goals (Current goals can be found in the Care Plan section) Acute Rehab PT Goals Patient Stated Goal: Return home - "not be here too much longer" PT Goal Formulation: With patient Time For Goal Achievement: 11/28/14 Potential to Achieve Goals: Good    Frequency Min 3X/week   Barriers to discharge        Co-evaluation               End of Session Equipment Utilized During Treatment: Oxygen Activity Tolerance: Patient limited by fatigue;Treatment limited secondary to medical complications (Comment) (high RR, low O2) Patient left: in chair;with call bell/phone within reach Nurse Communication: Mobility status         Time: 6269-4854 PT Time Calculation (min) (ACUTE ONLY): 17 min   Charges:   PT Evaluation $Initial PT Evaluation Tier I: 1 Procedure     PT G CodesRolinda Roan 11-24-14, 9:38 AM  Rolinda Roan, PT, DPT Acute Rehabilitation Services Pager: [phone]

## 2014-11-22 ENCOUNTER — Inpatient Hospital Stay (HOSPITAL_COMMUNITY): Payer: Medicare Other

## 2014-11-22 DIAGNOSIS — I480 Paroxysmal atrial fibrillation: Secondary | ICD-10-CM

## 2014-11-22 LAB — CBC
HCT: 31.6 % — ABNORMAL LOW (ref 39.0–52.0)
HEMOGLOBIN: 9.9 g/dL — AB (ref 13.0–17.0)
MCH: 30.8 pg (ref 26.0–34.0)
MCHC: 31.3 g/dL (ref 30.0–36.0)
MCV: 98.4 fL (ref 78.0–100.0)
PLATELETS: 251 10*3/uL (ref 150–400)
RBC: 3.21 MIL/uL — AB (ref 4.22–5.81)
RDW: 15.6 % — ABNORMAL HIGH (ref 11.5–15.5)
WBC: 14 10*3/uL — AB (ref 4.0–10.5)

## 2014-11-22 LAB — GLUCOSE, CAPILLARY
GLUCOSE-CAPILLARY: 115 mg/dL — AB (ref 65–99)
GLUCOSE-CAPILLARY: 120 mg/dL — AB (ref 65–99)
Glucose-Capillary: 118 mg/dL — ABNORMAL HIGH (ref 65–99)
Glucose-Capillary: 131 mg/dL — ABNORMAL HIGH (ref 65–99)

## 2014-11-22 LAB — BASIC METABOLIC PANEL
ANION GAP: 7 (ref 5–15)
BUN: 12 mg/dL (ref 6–20)
CALCIUM: 8 mg/dL — AB (ref 8.9–10.3)
CO2: 26 mmol/L (ref 22–32)
CREATININE: 0.8 mg/dL (ref 0.61–1.24)
Chloride: 100 mmol/L — ABNORMAL LOW (ref 101–111)
Glucose, Bld: 121 mg/dL — ABNORMAL HIGH (ref 65–99)
Potassium: 4 mmol/L (ref 3.5–5.1)
SODIUM: 133 mmol/L — AB (ref 135–145)

## 2014-11-22 MED ORDER — PHENYLEPHRINE HCL 10 MG/ML IJ SOLN
0.0000 ug/min | INTRAMUSCULAR | Status: DC
  Administered 2014-11-22: 10 ug/min via INTRAVENOUS

## 2014-11-22 MED ORDER — SODIUM CHLORIDE 0.9 % IJ SOLN
3.0000 mL | INTRAMUSCULAR | Status: DC | PRN
  Administered 2014-11-24: 3 mL via INTRAVENOUS
  Filled 2014-11-22: qty 3

## 2014-11-22 MED ORDER — SODIUM CHLORIDE 0.9 % IV SOLN: 250.0000 mL | INTRAVENOUS | Status: DC | PRN

## 2014-11-22 MED ORDER — POTASSIUM CHLORIDE CRYS ER 20 MEQ PO TBCR
20.0000 meq | EXTENDED_RELEASE_TABLET | Freq: Every day | ORAL | Status: AC
  Administered 2014-11-23 – 2014-11-25 (×3): 20 meq via ORAL
  Filled 2014-11-22 (×3): qty 1

## 2014-11-22 MED ORDER — SODIUM CHLORIDE 0.9 % IJ SOLN
3.0000 mL | Freq: Two times a day (BID) | INTRAMUSCULAR | Status: DC
Start: 2014-11-22 — End: 2014-11-27
  Administered 2014-11-22: 10 mL via INTRAVENOUS
  Administered 2014-11-23 – 2014-11-27 (×7): 3 mL via INTRAVENOUS

## 2014-11-22 MED ORDER — POTASSIUM CHLORIDE 10 MEQ/50ML IV SOLN
10.0000 meq | INTRAVENOUS | Status: AC
  Administered 2014-11-22 (×2): 10 meq via INTRAVENOUS
  Filled 2014-11-22 (×2): qty 50

## 2014-11-22 MED ORDER — AMIODARONE LOAD VIA INFUSION
150.0000 mg | Freq: Once | INTRAVENOUS | Status: AC
  Administered 2014-11-22: 150 mg via INTRAVENOUS
  Filled 2014-11-22: qty 83.34

## 2014-11-22 MED ORDER — AMIODARONE HCL IN DEXTROSE 360-4.14 MG/200ML-% IV SOLN
60.0000 mg/h | INTRAVENOUS | Status: AC
  Administered 2014-11-22 (×2): 60 mg/h via INTRAVENOUS
  Filled 2014-11-22: qty 200

## 2014-11-22 MED ORDER — FUROSEMIDE 10 MG/ML IJ SOLN
20.0000 mg | Freq: Once | INTRAMUSCULAR | Status: AC
  Administered 2014-11-22: 20 mg via INTRAVENOUS
  Filled 2014-11-22: qty 2

## 2014-11-22 MED ORDER — MOVING RIGHT ALONG BOOK
Freq: Once | Status: AC
  Administered 2014-11-22: 08:00:00
  Filled 2014-11-22: qty 1

## 2014-11-22 MED ORDER — LEVALBUTEROL HCL 0.63 MG/3ML IN NEBU: 0.6300 mg | INHALATION_SOLUTION | Freq: Four times a day (QID) | RESPIRATORY_TRACT | Status: DC | PRN

## 2014-11-22 MED ORDER — AMIODARONE HCL IN DEXTROSE 360-4.14 MG/200ML-% IV SOLN
30.0000 mg/h | INTRAVENOUS | Status: AC
  Filled 2014-11-22 (×3): qty 200

## 2014-11-22 MED ORDER — AMIODARONE HCL IN DEXTROSE 360-4.14 MG/200ML-% IV SOLN
INTRAVENOUS | Status: AC
Start: 2014-11-22 — End: 2014-11-22
  Filled 2014-11-22: qty 200

## 2014-11-22 MED ORDER — FUROSEMIDE 40 MG PO TABS
40.0000 mg | ORAL_TABLET | Freq: Every day | ORAL | Status: AC
  Administered 2014-11-23 – 2014-11-24 (×2): 40 mg via ORAL
  Filled 2014-11-22 (×2): qty 1

## 2014-11-22 MED FILL — Heparin Sodium (Porcine) Inj 1000 Unit/ML: INTRAMUSCULAR | Qty: 30 | Status: AC

## 2014-11-22 MED FILL — Potassium Chloride Inj 2 mEq/ML: INTRAVENOUS | Qty: 40 | Status: AC

## 2014-11-22 MED FILL — Magnesium Sulfate Inj 50%: INTRAMUSCULAR | Qty: 10 | Status: AC

## 2014-11-22 NOTE — Progress Notes (Signed)
  Amiodarone Drug - Drug Interaction Consult Note  Recommendations: Continue current medications as prescribed.   Amiodarone is metabolized by the cytochrome P450 system and therefore has the potential to cause many drug interactions. Amiodarone has an average plasma half-life of 50 days (range 20 to 100 days).   There is potential for drug interactions to occur several weeks or months after stopping treatment and the onset of drug interactions may be slow after initiating amiodarone.   [x]  Statins: Increased risk of myopathy. Simvastatin- restrict dose to 20mg  daily. Other statins: counsel patients to report any muscle pain or weakness immediately.  []  Anticoagulants: Amiodarone can increase anticoagulant effect. Consider warfarin dose reduction. Patients should be monitored closely and the dose of anticoagulant altered accordingly, remembering that amiodarone levels take several weeks to stabilize.  []  Antiepileptics: Amiodarone can increase plasma concentration of phenytoin, the dose should be reduced. Note that small changes in phenytoin dose can result in large changes in levels. Monitor patient and counsel on signs of toxicity.  [x]  Beta blockers: increased risk of bradycardia, AV block and myocardial depression. Sotalol - avoid concomitant use.  []   Calcium channel blockers (diltiazem and verapamil): increased risk of bradycardia, AV block and myocardial depression.  []   Cyclosporine: Amiodarone increases levels of cyclosporine. Reduced dose of cyclosporine is recommended.  []  Digoxin dose should be halved when amiodarone is started.  []  Diuretics: increased risk of cardiotoxicity if hypokalemia occurs.  []  Oral hypoglycemic agents (glyburide, glipizide, glimepiride): increased risk of hypoglycemia. Patient's glucose levels should be monitored closely when initiating amiodarone therapy.   []  Drugs that prolong the QT interval:  Torsades de pointes risk may be increased with  concurrent use - avoid if possible.  Monitor QTc, also keep magnesium/potassium WNL if concurrent therapy can't be avoided. Marland Kitchen Antibiotics: e.g. fluoroquinolones, erythromycin. . Antiarrhythmics: e.g. quinidine, procainamide, disopyramide, sotalol. . Antipsychotics: e.g. phenothiazines, haloperidol.  . Lithium, tricyclic antidepressants, and methadone.  Thank You,  Renold Genta, Pharm.D., BCPS Clinical Pharmacist Pager: [phone] 11/22/2014 10:18 AM

## 2014-11-22 NOTE — Clinical Social Work Note (Signed)
Clinical Social Work Assessment  Patient Details  Name: Ronald Bell MRN: [id number] Date of Birth: [birth year]  Date of referral:  11/22/14               Reason for consult:  Facility Placement                Permission sought to share information with:  Family Supports Permission granted to share information::  Yes, Verbal Permission Granted  Name::      Patient's son and wife.  Agency::   SNF admissions  Relationship::     Contact Information:     Housing/Transportation Living arrangements for the past 2 months:  Single Family Home Source of Information:  Patient, Spouse, Adult Children Patient Interpreter Needed:  None Criminal Activity/Legal Involvement Pertinent to Current Situation/Hospitalization:  No - Comment as needed Significant Relationships:  Adult Children, Spouse Lives with:  Spouse Do you feel safe going back to the place where you live?  No (Patient feels that once he has received some short term rehab, then he will be able to return back home with his wife.) Need for family participation in patient care:  Yes (Comment) (Patient requests his son to help with decision making.)  Care giving concerns: Patient is concerned that his wife will not be able to take care of him right now until he has had some short term rehab.   Social Worker assessment / plan: Patient is a 74 year old male who lives with his wife, he is alert and oriented x4.  Patient was talkative and had questions about SNF placement.  CSW explained SNF placement process to him and his son who was at the bedside.  Patient is in agreement that he needs to go to a SNF for short term rehab in order to get some strength back to return home.  Patient's son asked questions regarding cost and how insurance pays for SNF stay.  CSW answered questions for patient and family.  Patient's son and daughter in law are going to review SNF list and decide where they would like patient to go.  CSW to fax out patient and  begin SNF search process.  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  Patient/Family's Understanding of and Emotional Response to Diagnosis, Current Treatment, and Prognosis:  Patient and his family are aware of patient's diagnosis and current treatment plan.  Patient and family did not have any other questions at this time.  Emotional Assessment Appearance:  Appears stated age Attitude/Demeanor/Rapport:    Affect (typically observed):  Calm, Stable, Pleasant Orientation:  Oriented to Self, Oriented to Place, Oriented to  Time, Oriented to Situation Alcohol / Substance use:  Not Applicable Psych involvement (Current and /or in the community):  No (Comment)  Discharge Needs  Concerns to be addressed:  No discharge needs identified Readmission within the last 30 days:  No Current discharge risk:  None Barriers to Discharge:  No Barriers Identified   Ross Ludwig, LCSWA 11/22/2014, 5:17 PM

## 2014-11-22 NOTE — Progress Notes (Signed)
    Subjective:  Feels well. Denies chest pain or shortness of breath at rest. Went into AF with RVR this am.  Objective:  Vital Signs in the last 24 hours: Temp:  [97.6 F (36.4 C)-99.7 F (37.6 C)] 98 F (36.7 C) (09/01 0729) Pulse Rate:  [47-167] 129 (09/01 1045) Resp:  [16-37] 30 (09/01 1045) BP: (71-129)/(33-99) 108/92 mmHg (09/01 1045) SpO2:  [88 %-100 %] 95 % (09/01 1045) Arterial Line BP: (53-154)/(33-130) 99/85 mmHg (09/01 0745) Weight:  [149 lb (67.586 kg)] 149 lb (67.586 kg) (09/01 0630)  Intake/Output from previous day: 08/31 0701 - 09/01 0700 In: 1265.9 [P.O.:240; I.V.:775.9; IV Piggyback:250] Out: 1150 [Urine:1070; Chest Tube:80]  Physical Exam: Pt is alert and oriented, elderly male in NAD HEENT: normal Neck: JVP - normal Lungs: CTA bilaterally prolonged expiration CV: irregularly irregular without murmur or gallop Abd: soft, NT, Positive BS, no hepatomegaly Ext: no C/C/E, distal pulses intact and equal Skin: warm/dry no rash   Lab Results:  Recent Labs  11/21/14 1650 11/21/14 1654 11/22/14 0419  WBC 16.3*  --  14.0*  HGB 10.3* 11.2* 9.9*  PLT 254  --  251    Recent Labs  11/21/14 0457  11/21/14 1654 11/22/14 0419  NA 133*  --  134* 133*  K 3.6  --  4.7 4.0  CL 99*  --  101 100*  CO2 24  --   --  26  GLUCOSE 115*  --  115* 121*  BUN 11  --  13 12  CREATININE 0.85  < > 0.90 0.80  < > = values in this interval not displayed. No results for input(s): TROPONINI in the last 72 hours.  Invalid input(s): CK, MB  Cardiac Studies: 2D Echo: Study Conclusions  - Left ventricle: The cavity size was normal. Wall thickness was increased in a pattern of mild LVH. Systolic function was normal. The estimated ejection fraction was in the range of 55% to 60%. Wall motion was normal; there were no regional wall motion abnormalities. Doppler parameters are consistent with abnormal left ventricular relaxation (grade 1 diastolic  dysfunction). - Aortic valve: Mildly calcified annulus. - Left atrium: The atrium was mildly dilated.  Tele: Atrial fibrillation with RVR HR in 120's this am.   Assessment/Plan:  1. Severe AS - POD #2, echo reviewed and shows normal THV function. On ASA and plavix. 2. Post-op AFib with RVR - now on amio drip. BP will not allow for increase beta-blocker (currently on neo)  Overall he looks clinically well hopefully with convert to sinus rhythm within next 24 hours on IV amiodarone.  Sherren Mocha, M.D. 11/22/2014, 11:10 AM

## 2014-11-22 NOTE — Progress Notes (Signed)
[facility] BRIEF SICU PROGRESS NOTE  2 Days Post-Op  S/P Procedure(s) (LRB): TRANSCATHETER AORTIC VALVE REPLACEMENT, TRANSAPICAL (N/A) TRANSESOPHAGEAL ECHOCARDIOGRAM (TEE) (N/A) RIB PLATING OF LEFT 8TH RIB (Left)   Episode rapid Afib this morning, converted back to NSR on amiodarone Currently maintaining NSR and feeling better O2 sats 92% on 2 L/min UOP adequate   Plan: Continue IV amiodarone this evening.  Will replace albuterol with xopenex for prn bronchodilator treatments  [name] 11/22/2014 7:09 PM

## 2014-11-22 NOTE — Progress Notes (Addendum)
[address]       [place] [address]             [phone]        CARDIOTHORACIC SURGERY PROGRESS NOTE   R2 Days Post-Op Procedure(s) (LRB): TRANSCATHETER AORTIC VALVE REPLACEMENT, TRANSAPICAL (N/A) TRANSESOPHAGEAL ECHOCARDIOGRAM (TEE) (N/A) RIB PLATING OF LEFT 8TH RIB (Left)  Subjective: Feels pretty well.  No pain at present, but has had some soreness in left chest.  Intermittent SOB and wheezing  Objective: Vital signs: BP Readings from Last 1 Encounters:  11/22/14 107/57   Pulse Readings from Last 1 Encounters:  11/22/14 90   Resp Readings from Last 1 Encounters:  11/22/14 25   Temp Readings from Last 1 Encounters:  11/22/14 98 F (36.7 C) Oral    Hemodynamics:    Physical Exam:  Rhythm:   sinus  Breath sounds: Scattered rhonchi w/out wheezes  Heart sounds:  RRR w/out murmur  Incisions:  Clean and dry  Abdomen:  Soft, non-distended, non-tender  Extremities:  Warm, well-perfused    Intake/Output from previous day: 08/31 0701 - 09/01 0700 In: 1265.9 [P.O.:240; I.V.:775.9; IV Piggyback:250] Out: 1150 [Urine:1070; Chest Tube:80] Intake/Output this shift:    Lab Results:  CBC: Recent Labs  11/21/14 1650 11/21/14 1654 11/22/14 0419  WBC 16.3*  --  14.0*  HGB 10.3* 11.2* 9.9*  HCT 32.0* 33.0* 31.6*  PLT 254  --  251    BMET:  Recent Labs  11/21/14 0457  11/21/14 1654 11/22/14 0419  NA 133*  --  134* 133*  K 3.6  --  4.7 4.0  CL 99*  --  101 100*  CO2 24  --   --  26  GLUCOSE 115*  --  115* 121*  BUN 11  --  13 12  CREATININE 0.85  < > 0.90 0.80  CALCIUM 8.0*  --   --  8.0*  < > = values in this interval not displayed.   PT/INR:   Recent Labs  11/20/14 1135  LABPROT 18.4*  INR 1.52*    CBG (last 3)   Recent Labs  11/21/14 2014 11/22/14 0013 11/22/14 0411  GLUCAP 140* 120* 118*    ABG    Component Value Date/Time   PHART 7.425 11/20/2014 2352   PCO2ART 42.2 11/20/2014 2352   PO2ART 69.0*  11/20/2014 2352   HCO3 27.5* 11/20/2014 2352   TCO2 24 11/21/2014 1654   ACIDBASEDEF 2.0 11/20/2014 1715   O2SAT 94.0 11/20/2014 2352    CXR: Looks good.  Mild CHF superimposed on COPD  Transthoracic Echocardiography  Patient:  Ronald Bell, Ronald Bell MR #:    [id number] Study Date: 11/21/2014 Gender:   M Age:    74 Height:   160 cm Weight:   64 kg BSA:    1.7 m^2 Pt. Status: Room:    2S01C  ADMITTING  Darylene Price, M.D. ATTENDING  Darylene Price, M.D. ORDERING   Darylene Price, M.D. REFERRING  Darylene Price, M.D. SONOGRAPHER Florentina Jenny, RDCS PERFORMING  [facility], Inpatient SONOGRAPHER Madelin Rear, RDCS  cc:  ------------------------------------------------------------------- LV EF: 55% -  60%  ------------------------------------------------------------------- Indications:   Aortic stenosis 424.1.  ------------------------------------------------------------------- History:  PMH: S/P TAVR.  ------------------------------------------------------------------- Study Conclusions  - Left ventricle: The cavity size was normal. Wall thickness was increased in a pattern of mild LVH. Systolic function was normal. The estimated ejection fraction was in the range of 55% to 60%. Wall motion was normal; there were no  regional wall motion abnormalities. Doppler parameters are consistent with abnormal left ventricular relaxation (grade 1 diastolic dysfunction). - Aortic valve: Mildly calcified annulus. - Left atrium: The atrium was mildly dilated.  Transthoracic echocardiography. M-mode, complete 2D, spectral Doppler, and color Doppler. Birthdate: Patient birthdate: 1940-03-25. Age: Patient is 74 yr old. Sex: Gender: male. BMI: 25 kg/m^2. Blood pressure:   105/52 Patient status: Inpatient. Study date: Study date: 11/21/2014. Study time: 02:13 PM. Location:  ICU/CCU  -------------------------------------------------------------------  ------------------------------------------------------------------- Left ventricle: The cavity size was normal. Wall thickness was increased in a pattern of mild LVH. Systolic function was normal. The estimated ejection fraction was in the range of 55% to 60%. Wall motion was normal; there were no regional wall motion abnormalities. Doppler parameters are consistent with abnormal left ventricular relaxation (grade 1 diastolic dysfunction).  ------------------------------------------------------------------- Aortic valve:  Mildly calcified annulus. Doppler:  There was no stenosis.  There was no regurgitation.  VTI ratio of LVOT to aortic valve: 0.82. Peak velocity ratio of LVOT to aortic valve: 0.9. Mean velocity ratio of LVOT to aortic valve: 0.85.  Mean gradient (S): 4 mm Hg. Peak gradient (S): 9 mm Hg.  ------------------------------------------------------------------- Aorta: Aortic root: The aortic root was normal in size. Ascending aorta: The ascending aorta was normal in size.  ------------------------------------------------------------------- Mitral valve:  Structurally normal valve.  Leaflet separation was normal. Doppler: Transvalvular velocity was within the normal range. There was no evidence for stenosis. There was no regurgitation.  Peak gradient (D): 3 mm Hg.  ------------------------------------------------------------------- Left atrium: The atrium was mildly dilated.  ------------------------------------------------------------------- Right ventricle: The cavity size was normal. Systolic function was normal.  ------------------------------------------------------------------- Pulmonic valve:  The valve appears to be grossly normal. Doppler: There was no significant regurgitation.  ------------------------------------------------------------------- Tricuspid  valve:  Structurally normal valve.  Leaflet separation was normal. Doppler: Transvalvular velocity was within the normal range. There was no regurgitation.  ------------------------------------------------------------------- Right atrium: The atrium was normal in size.  ------------------------------------------------------------------- Pericardium: There was no pericardial effusion.  ------------------------------------------------------------------- Systemic veins: Inferior vena cava: The vessel was normal in size. The respirophasic diameter changes were in the normal range (>= 50%), consistent with normal central venous pressure.  ------------------------------------------------------------------- Measurements  Left ventricle             Value    Reference LV ID, ED, PLAX chordal        43.8 mm   43 - 52 LV ID, ES, PLAX chordal        32.8 mm   23 - 38 LV fx shortening, PLAX chordal (L)   25  %   >=29 LV PW thickness, ED          11.5 mm   --------- IVS/LV PW ratio, ED          1.15     <=1.3 LV e&', lateral             7.4  cm/s  --------- LV E/e&', lateral            12.46    --------- LV e&', medial             6.96 cm/s  --------- LV E/e&', medial            13.25    --------- LV e&', average             7.18 cm/s  --------- LV E/e&', average            12.84    ---------  Ventricular  septum           Value    Reference IVS thickness, ED           13.2 mm   ---------  LVOT                  Value    Reference LVOT peak velocity, S         133  cm/s  --------- LVOT mean velocity, S         78.7 cm/s  --------- LVOT VTI, S              19.9 cm   --------- LVOT peak gradient, S         7   mm  Hg ---------  Aortic valve              Value    Reference Aortic valve peak velocity, S     147  cm/s  --------- Aortic valve mean velocity, S     92.8 cm/s  --------- Aortic valve VTI, S          24.4 cm   --------- Aortic mean gradient, S        4   mm Hg --------- Aortic peak gradient, S        9   mm Hg --------- VTI ratio, LVOT/AV           0.82     --------- Velocity ratio, peak, LVOT/AV     0.9     --------- Velocity ratio, mean, LVOT/AV     0.85     ---------  Aorta                 Value    Reference Aortic root ID, ED           28  mm   ---------  Left atrium              Value    Reference LA ID, A-P, ES             40  mm   --------- LA ID/bsa, A-P         (H)   2.36 cm/m^2 <=2.2 LA volume, S              62.8 ml   --------- LA volume/bsa, S            37  ml/m^2 --------- LA volume, ES, 1-p A4C         61.9 ml   --------- LA volume/bsa, ES, 1-p A4C       36.4 ml/m^2 --------- LA volume, ES, 1-p A2C         56.3 ml   --------- LA volume/bsa, ES, 1-p A2C       33.1 ml/m^2 ---------  Mitral valve              Value    Reference Mitral E-wave peak velocity      92.2 cm/s  --------- Mitral A-wave peak velocity      89.7 cm/s  --------- Mitral deceleration time        194  ms   150 - 230 Mitral peak gradient, D        3   mm Hg --------- Mitral E/A ratio, peak         1      ---------  Systemic veins  Value    Reference Estimated CVP             3   mm Hg ---------  Right ventricle            Value    Reference TAPSE                 15.5 mm    --------- RV s&', lateral, S           14.7 cm/s  ---------  Legend: (L) and (H) mark values outside specified reference range.  ------------------------------------------------------------------- Prepared and Electronically Authenticated by  Mertie Moores, M.D. 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    [name] 11/22/2014 7:43 AM

## 2014-11-22 NOTE — Clinical Social Work Note (Signed)
CSW received phone call from case manager, patient has agreed to SNF, CSW to complete assessment and start SNF search process.  Jones Broom. [place], MSW, [place] 11/22/2014 11:47 AM

## 2014-11-23 ENCOUNTER — Inpatient Hospital Stay (HOSPITAL_COMMUNITY): Payer: Medicare Other

## 2014-11-23 DIAGNOSIS — I4891 Unspecified atrial fibrillation: Secondary | ICD-10-CM

## 2014-11-23 DIAGNOSIS — I350 Nonrheumatic aortic (valve) stenosis: Secondary | ICD-10-CM

## 2014-11-23 LAB — CBC
HCT: 32.5 % — ABNORMAL LOW (ref 39.0–52.0)
Hemoglobin: 10.5 g/dL — ABNORMAL LOW (ref 13.0–17.0)
MCH: 31.4 pg (ref 26.0–34.0)
MCHC: 32.3 g/dL (ref 30.0–36.0)
MCV: 97.3 fL (ref 78.0–100.0)
PLATELETS: 282 10*3/uL (ref 150–400)
RBC: 3.34 MIL/uL — ABNORMAL LOW (ref 4.22–5.81)
RDW: 15.1 % (ref 11.5–15.5)
WBC: 14.5 10*3/uL — ABNORMAL HIGH (ref 4.0–10.5)

## 2014-11-23 LAB — BASIC METABOLIC PANEL
Anion gap: 7 (ref 5–15)
BUN: 11 mg/dL (ref 6–20)
CHLORIDE: 99 mmol/L — AB (ref 101–111)
CO2: 26 mmol/L (ref 22–32)
CREATININE: 0.78 mg/dL (ref 0.61–1.24)
Calcium: 8.2 mg/dL — ABNORMAL LOW (ref 8.9–10.3)
GFR calc Af Amer: >60 mL/min (ref >60)
Glucose, Bld: 131 mg/dL — ABNORMAL HIGH (ref 65–99)
Potassium: 4.3 mmol/L (ref 3.5–5.1)
SODIUM: 132 mmol/L — AB (ref 135–145)

## 2014-11-23 MED ORDER — IPRATROPIUM-ALBUTEROL 0.5-2.5 (3) MG/3ML IN SOLN
3.0000 mL | RESPIRATORY_TRACT | Status: DC | PRN
  Administered 2014-11-24 – 2014-11-27 (×8): 3 mL via RESPIRATORY_TRACT
  Filled 2014-11-23 (×8): qty 3

## 2014-11-23 MED ORDER — AMIODARONE HCL 200 MG PO TABS
200.0000 mg | ORAL_TABLET | Freq: Two times a day (BID) | ORAL | Status: DC
  Administered 2014-11-26 – 2014-11-27 (×3): 200 mg via ORAL
  Filled 2014-11-23 (×5): qty 1

## 2014-11-23 MED ORDER — DIPHENHYDRAMINE HCL 25 MG PO CAPS
25.0000 mg | ORAL_CAPSULE | Freq: Every evening | ORAL | Status: DC | PRN
  Administered 2014-11-23 – 2014-11-27 (×4): 25 mg via ORAL
  Filled 2014-11-23 (×4): qty 1

## 2014-11-23 MED ORDER — AMIODARONE HCL 200 MG PO TABS
400.0000 mg | ORAL_TABLET | Freq: Two times a day (BID) | ORAL | Status: AC
  Administered 2014-11-23 – 2014-11-25 (×6): 400 mg via ORAL
  Filled 2014-11-23 (×6): qty 2

## 2014-11-23 NOTE — Progress Notes (Signed)
Pt got SOB ambulating to the bathroom. Urinal was suggested but pt refused to use it and wanted to ambulate to bathroom. When returned to bed, pt sat on side of the bed to catch his breath. O2 stat 95.   Will continue to monitor.  Angus Seller

## 2014-11-23 NOTE — Progress Notes (Signed)
Pt was offered to ambulate with RN but refused. Pt stated that he felt like he did a lot of walking throughout the day. Will continue to follow up.  Angus Seller

## 2014-11-23 NOTE — Progress Notes (Addendum)
[address]       [place] [address]             [phone]        CARDIOTHORACIC SURGERY PROGRESS NOTE   R3 Days Post-Op Procedure(s) (LRB): TRANSCATHETER AORTIC VALVE REPLACEMENT, TRANSAPICAL (N/A) TRANSESOPHAGEAL ECHOCARDIOGRAM (TEE) (N/A) RIB PLATING OF LEFT 8TH RIB (Left)  Subjective: Looks good.  Didn't sleep last night but o/w no complaints.  Breathing much improved.  Objective: Vital signs: BP Readings from Last 1 Encounters:  11/23/14 123/48   Pulse Readings from Last 1 Encounters:  11/23/14 93   Resp Readings from Last 1 Encounters:  11/23/14 29   Temp Readings from Last 1 Encounters:  11/23/14 97.7 F (36.5 C) Oral    Hemodynamics:    Physical Exam:  Rhythm:   sinus  Breath sounds: clear  Heart sounds:  RRR  Incisions:  Clean and dry  Abdomen:  Soft, non-distended, non-tender  Extremities:  Warm, well-perfused    Intake/Output from previous day: 09/01 0701 - 09/02 0700 In: 1867.7 [P.O.:610; I.V.:1107.7; IV Piggyback:150] Out: 1324 [MWNUU:7253] Intake/Output this shift:    Lab Results:  CBC: Recent Labs  11/22/14 0419 11/23/14 0356  WBC 14.0* 14.5*  HGB 9.9* 10.5*  HCT 31.6* 32.5*  PLT 251 282    BMET:  Recent Labs  11/22/14 0419 11/23/14 0356  NA 133* 132*  K 4.0 4.3  CL 100* 99*  CO2 26 26  GLUCOSE 121* 131*  BUN 12 11  CREATININE 0.80 0.78  CALCIUM 8.0* 8.2*     PT/INR:   Recent Labs  11/20/14 1135  LABPROT 18.4*  INR 1.52*    CBG (last 3)   Recent Labs  11/22/14 0013 11/22/14 0411 11/22/14 0731  GLUCAP 120* 118* 131*    ABG    Component Value Date/Time   PHART 7.425 11/20/2014 2352   PCO2ART 42.2 11/20/2014 2352   PO2ART 69.0* 11/20/2014 2352   HCO3 27.5* 11/20/2014 2352   TCO2 24 11/21/2014 1654   ACIDBASEDEF 2.0 11/20/2014 1715   O2SAT 94.0 11/20/2014 2352    CXR: CHEST 2 VIEW  COMPARISON: Single view of the chest 11/22/2014.  FINDINGS: Bibasilar atelectasis  seen on yesterday's examination is improved. There small bilateral pleural effusions. No pneumothorax is identified. Heart size is mildly enlarged. Fixation of the left eighth rib with plate and screws is noted. Right IJ catheter is unchanged.  IMPRESSION: Improved bibasilar atelectasis.  Cardiomegaly without edema.   Electronically Signed  By: Inge Rise M.D.  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  [name] 11/23/2014 7:51 AM

## 2014-11-23 NOTE — Progress Notes (Signed)
    Subjective:  The patient is sitting up in the chair this morning. States that he is feeling okay. Had rib cage pain overnight with coughing. No other complaints.  Objective:  Vital Signs in the last 24 hours: Temp:  [97.7 F (36.5 C)-97.8 F (36.6 C)] 97.7 F (36.5 C) (09/02 0345) Pulse Rate:  [29-167] 93 (09/02 0700) Resp:  [15-38] 29 (09/02 0700) BP: (73-170)/(30-92) 123/48 mmHg (09/02 0700) SpO2:  [81 %-100 %] 92 % (09/02 0745) Weight:  [149 lb 11.1 oz (67.9 kg)] 149 lb 11.1 oz (67.9 kg) (09/02 0615)  Intake/Output from previous day: 09/01 0701 - 09/02 0700 In: 1867.7 [P.O.:610; I.V.:1107.7; IV Piggyback:150] Out: 4158 [Urine:1845]  Physical Exam: Pt is alert and oriented, pleasant elderly male in NAD HEENT: normal Neck: JVP - normal Lungs: Coarse bilaterally, prolonged expiration CV: RRR with grade 2/6 systolic ejection murmur at the right upper sternal border Abd: soft, NT Ext: no C/C/E, distal pulses intact and equal Skin: warm/dry no rash   Lab Results:  Recent Labs  11/22/14 0419 11/23/14 0356  WBC 14.0* 14.5*  HGB 9.9* 10.5*  PLT 251 282    Recent Labs  11/22/14 0419 11/23/14 0356  NA 133* 132*  K 4.0 4.3  CL 100* 99*  CO2 26 26  GLUCOSE 121* 131*  BUN 12 11  CREATININE 0.80 0.78   No results for input(s): TROPONINI in the last 72 hours.  Invalid input(s): CK, MB  Tele: Normal sinus rhythm  Assessment/Plan:  1. Severe aortic stenosis, postoperative day #3 from TAVR. Patient continues on aspirin and Plavix. Clinically stable.  2. Postoperative atrial fibrillation with RVR: Telemetry reviewed and he is maintaining sinus rhythm on IV amiodarone. Change to oral amiodarone today. Will try to minimize duration of amiodarone considering his chronic lung disease.  3. Chronic respiratory failure: Appears stable on oxygen per nasal cannula. Continue pulmonary toilet.  Disposition: Per Dr. Roxy Manns. Patient appears to be making slow and steady  progress   Sherren Mocha, M.D. 11/23/2014, 7:53 AM

## 2014-11-23 NOTE — Progress Notes (Signed)
Physical Therapy Treatment Patient Details Name: NIKOLA MARONE MRN: [id number] DOB: 10/07/1940 Today's Date: 11/23/2014    History of Present Illness Pt is a 74 y/o male who presents with SOB, fatigue and severe aortic stenosis. Pt underwent TAVR procedure and subsequent 8th rib plating due to fracture during procedure, on 11/20/14.    PT Comments    Patient has made good progress with less dyspnea and SaO2 >90% on 4L with activity; requires cues for pursed lip breathing. Patient continues to require education regarding safe and proper use of DME. Will need to begin to work on bed mobility with HOB flat and no rail (avoided thus far due to rib pain--which has improved).    Follow Up Recommendations  SNF;Supervision/Assistance - 24 hour     Equipment Recommendations  Rolling walker with 5" wheels    Recommendations for Other Services       Precautions / Restrictions Precautions Precautions: Fall Restrictions Weight Bearing Restrictions: No    Mobility  Bed Mobility Overal bed mobility: Needs Assistance;+ 2 for safety/equipment Bed Mobility: Supine to Sit     Supine to sit: Min guard;HOB elevated     General bed mobility comments: + HOB elevated and rail (due to rib fx); minguard as nearly impulsive as moving to EOB  Transfers Overall transfer level: Needs assistance Equipment used: Rolling walker (2 wheeled) Transfers: Sit to/from Stand Sit to Stand: Min assist;+2 safety/equipment         General transfer comment: verbal cues for safe hand placement and safe use of RW  Ambulation/Gait Ambulation/Gait assistance: Min guard;+2 safety/equipment Ambulation Distance (Feet): 250 Feet Assistive device: Rolling walker (2 wheeled) Gait Pattern/deviations: Step-through pattern;Decreased stride length Gait velocity: Decreased Gait velocity interpretation: Below normal speed for age/gender General Gait Details: vc to minimize conversation and focus on pursed lip  breathing; occasional cues for walker placement   Stairs            Wheelchair Mobility    Modified Rankin (Stroke Patients Only)       Balance Overall balance assessment: Needs assistance Sitting-balance support: No upper extremity supported;Feet supported Sitting balance-Leahy Scale: Fair     Standing balance support: No upper extremity supported Standing balance-Leahy Scale: Poor Standing balance comment: minguard for safety due to impulsivity                    Cognition Arousal/Alertness: Awake/alert Behavior During Therapy: Restless Overall Cognitive Status: Within Functional Limits for tasks assessed                      Exercises      General Comments        Pertinent Vitals/Pain Pain Assessment: 0-10 Pain Score: 1  Pain Location: Lt side Pain Descriptors / Indicators: Discomfort Pain Intervention(s): Limited activity within patient's tolerance;Monitored during session;Repositioned    Home Living                      Prior Function            PT Goals (current goals can now be found in the care plan section) Acute Rehab PT Goals Patient Stated Goal: go for short-term rehab as wife cannot care for him current level Time For Goal Achievement: 11/28/14 Progress towards PT goals: Progressing toward goals    Frequency  Min 2X/week    PT Plan Current plan remains appropriate;Frequency needs to be updated    Co-evaluation  End of Session Equipment Utilized During Treatment: Oxygen;Gait belt Activity Tolerance: Patient tolerated treatment well Patient left: in chair;with call bell/phone within reach     Time: 1257-1313 PT Time Calculation (min) (ACUTE ONLY): 16 min  Charges:  $Gait Training: 8-22 mins                    G Codes:      Kiara Keep 12/20/14, 1:31 PM Pager [phone]

## 2014-11-23 NOTE — Clinical Social Work Placement (Signed)
   CLINICAL SOCIAL WORK PLACEMENT  NOTE  Date:  11/23/2014  Patient Details  Name: CURRAN LENDERMAN MRN: [id number] Date of Birth: 1940/11/16  Clinical Social Work is seeking post-discharge placement for this patient at the [place] level of care (*CSW will initial, date and re-position this form in  chart as items are completed):  Yes   Patient/family provided with [facility] Work Department's list of facilities offering this level of care within the geographic area requested by the patient (or if unable, by the patient's family).  Yes   Patient/family informed of their freedom to choose among providers that offer the needed level of care, that participate in Medicare, Medicaid or managed care program needed by the patient, have an available bed and are willing to accept the patient.  Yes   Patient/family informed of [facility]'s ownership interest in [facility] and [facility], as well as of the fact that they are under no obligation to receive care at these facilities.  PASRR submitted to EDS on 11/22/14     PASRR number received on 11/22/14     Existing PASRR number confirmed on       FL2 transmitted to all facilities in geographic area requested by pt/family on       FL2 transmitted to all facilities within larger geographic area on 11/22/14     Patient informed that his/her managed care company has contracts with or will negotiate with certain facilities, including the following:            Patient/family informed of bed offers received.  Patient chooses bed at       Physician recommends and patient chooses bed at      Patient to be transferred to   on  .  Patient to be transferred to facility by       Patient family notified on   of transfer.  Name of family member notified:        PHYSICIAN Please sign FL2     Additional Comment:    _______________________________________________ Ross Ludwig,  LCSWA 11/23/2014, 6:45 PM

## 2014-11-24 DIAGNOSIS — J449 Chronic obstructive pulmonary disease, unspecified: Secondary | ICD-10-CM

## 2014-11-24 LAB — TYPE AND SCREEN
ABO/RH(D): A POS
ANTIBODY SCREEN: NEGATIVE
UNIT DIVISION: 0
Unit division: 0

## 2014-11-24 LAB — CBC
HCT: 31.9 % — ABNORMAL LOW (ref 39.0–52.0)
Hemoglobin: 10.2 g/dL — ABNORMAL LOW (ref 13.0–17.0)
MCH: 31.4 pg (ref 26.0–34.0)
MCHC: 32 g/dL (ref 30.0–36.0)
MCV: 98.2 fL (ref 78.0–100.0)
PLATELETS: 310 10*3/uL (ref 150–400)
RBC: 3.25 MIL/uL — ABNORMAL LOW (ref 4.22–5.81)
RDW: 14.8 % (ref 11.5–15.5)
WBC: 8.9 10*3/uL (ref 4.0–10.5)

## 2014-11-24 LAB — BASIC METABOLIC PANEL
Anion gap: 7 (ref 5–15)
BUN: 16 mg/dL (ref 6–20)
CHLORIDE: 98 mmol/L — AB (ref 101–111)
CO2: 28 mmol/L (ref 22–32)
CREATININE: 0.73 mg/dL (ref 0.61–1.24)
Calcium: 8.3 mg/dL — ABNORMAL LOW (ref 8.9–10.3)
GFR calc Af Amer: >60 mL/min (ref >60)
Glucose, Bld: 105 mg/dL — ABNORMAL HIGH (ref 65–99)
Potassium: 4.1 mmol/L (ref 3.5–5.1)
SODIUM: 133 mmol/L — AB (ref 135–145)

## 2014-11-24 MED ORDER — AMLODIPINE BESYLATE 10 MG PO TABS
10.0000 mg | ORAL_TABLET | Freq: Every day | ORAL | Status: DC
  Administered 2014-11-24 – 2014-11-27 (×4): 10 mg via ORAL
  Filled 2014-11-24 (×4): qty 1

## 2014-11-24 NOTE — Progress Notes (Addendum)
[address]       [employer] [id number]             [phone]      4 Days Post-Op Procedure(s) (LRB): TRANSCATHETER AORTIC VALVE REPLACEMENT, TRANSAPICAL (N/A) TRANSESOPHAGEAL ECHOCARDIOGRAM (TEE) (N/A) RIB PLATING OF LEFT 8TH RIB (Left) Subjective: feeling better, feels breathing is improving  Objective: Vital signs in last 24 hours: Temp:  [97.4 F (36.3 C)-97.7 F (36.5 C)] 97.5 F (36.4 C) (09/03 0313) Pulse Rate:  [61-94] 76 (09/03 0313) Cardiac Rhythm:  [-] Normal sinus rhythm (09/03 0837) Resp:  [20-31] 22 (09/03 0313) BP: (99-163)/(34-100) 132/100 mmHg (09/03 0313) SpO2:  [77 %-100 %] 98 % (09/03 0313) Weight:  [148 lb 9.4 oz (67.4 kg)] 148 lb 9.4 oz (67.4 kg) (09/03 0313)  Hemodynamic parameters for last 24 hours:    Intake/Output from previous day: 09/02 0701 - 09/03 0700 In: 848.5 [P.O.:720; I.V.:128.5] Out: 1350 [Urine:1350] Intake/Output this shift: Total I/O In: 240 [P.O.:240] Out: -   General appearance: alert, cooperative and no distress Heart: regular rate and rhythm Lungs: coarse, dim throughout Abdomen: benign Extremities: mild edema Wound: incis healing well  Lab Results:  Recent Labs  11/23/14 0356 11/24/14 0342  WBC 14.5* 8.9  HGB 10.5* 10.2*  HCT 32.5* 31.9*  PLT 282 310   BMET:  Recent Labs  11/23/14 0356 11/24/14 0342  NA 132* 133*  K 4.3 4.1  CL 99* 98*  CO2 26 28  GLUCOSE 131* 105*  BUN 11 16  CREATININE 0.78 0.73  CALCIUM 8.2* 8.3*    PT/INR: No results for input(s): LABPROT, INR in the last 72 hours. ABG    Component Value Date/Time   PHART 7.425 11/20/2014 2352   HCO3 27.5* 11/20/2014 2352   TCO2 24 11/21/2014 1654   ACIDBASEDEF 2.0 11/20/2014 1715   O2SAT 94.0 11/20/2014 2352   CBG (last 3)   Recent Labs  11/22/14 0013 11/22/14 0411 11/22/14 0731  GLUCAP 120* 118* 131*    Meds Scheduled Meds: . acetaminophen  1,000 mg Oral 4 times per day  . [START ON 11/26/2014]  amiodarone  200 mg Oral BID PC  . amiodarone  400 mg Oral BID PC  . antiseptic oral rinse  7 mL Mouth Rinse QID  . aspirin EC  81 mg Oral Daily  . atorvastatin  40 mg Oral Daily  . clopidogrel  75 mg Oral Q breakfast  . furosemide  40 mg Oral Daily  . metoprolol tartrate  12.5 mg Oral BID  . mometasone-formoterol  2 puff Inhalation BID  . pantoprazole  40 mg Oral Daily  . potassium chloride  20 mEq Oral Daily  . sodium chloride  3 mL Intravenous Q12H  . sodium chloride  3 mL Intravenous Q12H   Continuous Infusions: . sodium chloride 20 mL/hr at 11/22/14 2000   PRN Meds:.sodium chloride, sodium chloride, diphenhydrAMINE, ipratropium-albuterol, metoprolol, ondansetron (ZOFRAN) IV, oxyCODONE, sodium chloride, sodium chloride  Xrays Dg Chest 2 View  11/23/2014   CLINICAL DATA:  Status post aortic valve replacement and plating at the left eighth rib on 11/20/2014.  EXAM: CHEST  2 VIEW  COMPARISON:  Single view of the chest 11/22/2014.  FINDINGS: Bibasilar atelectasis seen on yesterday's examination is improved. There small bilateral pleural effusions. No pneumothorax is identified. Heart size is mildly enlarged. Fixation of the left eighth rib with plate and screws is noted. Right IJ catheter is unchanged.  IMPRESSION: Improved bibasilar atelectasis.  Cardiomegaly without edema.   Electronically Signed   By: Inge Rise M.D.   On: 11/23/2014 07:25    Assessment/Plan: S/P Procedure(s) (LRB): TRANSCATHETER AORTIC VALVE REPLACEMENT, TRANSAPICAL (N/A) TRANSESOPHAGEAL ECHOCARDIOGRAM (TEE) (N/A) RIB PLATING OF LEFT 8TH RIB (Left)  1 steady progress 2 cont pulm toilet/nebs- wean O2 3 HTN- restart norvasc for now 4 gentle diuresis 5 H/H pretty stable, platelets improved 6 maintaining sinus rhythm- will have to be very careful with amiodarone and severe COPD   LOS: 4 days    GOLD,WAYNE E 11/24/2014  Patient seen and examined, agree with above Continue present care Plan SNF placement  early next week if continues to progress  Remo Lipps C. Roxan Hockey, MD [facility] [phone]

## 2014-11-24 NOTE — Progress Notes (Addendum)
CSW met with patient this afternoon and provided current SNF bed offers. He stated that per his wife and kids- they prefer as first choice- [facility]; 2nd choice [facility]. Both facilities offered on patient. [facility] stated cannot offer until Tuesday.  Unsure as to when patient will be medically stable.  Patient prefers a private room if possible but would accept a semi-private until a private room opened up.  CSW left a message for [place]- Admission at [facility] re: above. CSW services will continue to monitor for possible date of stability.  Lorie Phenix. Murrell Redden [phone]   CSW received call from [place] at [facility]. She stated that family has toured facility and requested placement. She will have a private room available for patient.  Lorie Phenix. Pauline Good, [place]

## 2014-11-24 NOTE — Progress Notes (Signed)
   Subjective: Breathing was tough last night  Got breathing treatment  No CP   Objective: Filed Vitals:   11/23/14 1858 11/23/14 2058 11/23/14 2121 11/24/14 0313  BP: 115/63  150/59 132/100  Pulse: 91 88 84 76  Temp: 97.5 F (36.4 C)   97.5 F (36.4 C)  TempSrc: Oral   Oral  Resp: 23 20  22   Height:      Weight:    148 lb 9.4 oz (67.4 kg)  SpO2: 100% 98%  98%   Weight change: -1 lb 1.6 oz (-0.5 kg)  Intake/Output Summary (Last 24 hours) at 11/24/14 0932 Last data filed at 11/24/14 0837  Gross per 24 hour  Intake 775.05 ml  Output   1350 ml  Net -574.95 ml    General: Alert, awake, oriented x3, in no acute distress Neck:  JVP is normal Heart: Regular rate and rhythm,Gr I/VI systolic murmur  No rubs, gallops.  Lungs: Decreased airflow  Some rhonchi Exemities:  No edema.   Neuro: Grossly intact, nonfocal.  Tele  SR    Lab Results: Results for orders placed or performed during the hospital encounter of 11/20/14 (from the past 24 hour(s))  CBC     Status: Abnormal   Collection Time: 11/24/14  3:42 AM  Result Value Ref Range   WBC 8.9 4.0 - 10.5 K/uL   RBC 3.25 (L) 4.22 - 5.81 MIL/uL   Hemoglobin 10.2 (L) 13.0 - 17.0 g/dL   HCT 31.9 (L) 39.0 - 52.0 %   MCV 98.2 78.0 - 100.0 fL   MCH 31.4 26.0 - 34.0 pg   MCHC 32.0 30.0 - 36.0 g/dL   RDW 14.8 11.5 - 15.5 %   Platelets 310 150 - 400 K/uL  Basic metabolic panel     Status: Abnormal   Collection Time: 11/24/14  3:42 AM  Result Value Ref Range   Sodium 133 (L) 135 - 145 mmol/L   Potassium 4.1 3.5 - 5.1 mmol/L   Chloride 98 (L) 101 - 111 mmol/L   CO2 28 22 - 32 mmol/L   Glucose, Bld 105 (H) 65 - 99 mg/dL   BUN 16 6 - 20 mg/dL   Creatinine, Ser 0.73 0.61 - 1.24 mg/dL   Calcium 8.3 (L) 8.9 - 10.3 mg/dL   GFR calc non Af Amer >60 >60 mL/min   GFR calc Af Amer >60 >60 mL/min   Anion gap 7 5 - 15    Studies/Results: No results found.  Medications:Reviewed  @PROBHOSP @  1  Aortic stenosis  S/p TAVR   Continue  asa and plavix    2  Atrial fib    Currently SR on PO amio  Plan for short course  F/U as outpt.    3  Pulm  Cont pulm toilet    LOS: 4 days   Dorris Carnes 11/24/2014, 9:32 AM

## 2014-11-24 NOTE — Progress Notes (Cosign Needed)
CARDIAC REHAB PHASE I   PRE:  Rate/Rhythm: 78 sinus rhythm  BP:  Supine:   Sitting: 150/60  Standing:    SaO2: 96% 4L  MODE:  Ambulation: 170   POST:  Rate/Rhythem: 80 sinus rhythm  BP:  Supine:   Sitting: 150/60   SaO2: 85% 4L up to 93% with PLB  1035-1100  Pt ambulated in hallway x1 assist using rolling walker. Pt attempted to walk without walker however ambulation was easier with walker.  Pt had quick gait.  2 standing rest breaks. Pt c/o dyspnea and fatigue.  Pt returned to chair with call light in reach. Pt would like to speak to social worker about SNF placement pt has researched nursing home preferences. RN made aware.  Pt encouraged to increase IS use and PLB.  Pt educated about importance of using home oxygen.  Pt oriented to outpatient cardiac rehab.  At pt request, referral will be sent to [facility]. Pt verbalized understanding  Ronald Bell, [employer]

## 2014-11-25 MED ORDER — HYDROCHLOROTHIAZIDE 12.5 MG PO CAPS
12.5000 mg | ORAL_CAPSULE | Freq: Every day | ORAL | Status: DC
  Administered 2014-11-25 – 2014-11-27 (×3): 12.5 mg via ORAL
  Filled 2014-11-25 (×3): qty 1

## 2014-11-25 MED ORDER — FLUTICASONE FUROATE-VILANTEROL 100-25 MCG/INH IN AEPB
1.0000 | INHALATION_SPRAY | Freq: Every day | RESPIRATORY_TRACT | Status: DC
  Administered 2014-11-26: 1 via RESPIRATORY_TRACT

## 2014-11-25 MED ORDER — IRBESARTAN 150 MG PO TABS
150.0000 mg | ORAL_TABLET | Freq: Every day | ORAL | Status: DC
  Administered 2014-11-26 – 2014-11-27 (×2): 150 mg via ORAL
  Filled 2014-11-25 (×3): qty 1

## 2014-11-25 MED ORDER — VALSARTAN-HYDROCHLOROTHIAZIDE 160-12.5 MG PO TABS: 1.0000 | ORAL_TABLET | Freq: Every day | ORAL | Status: DC

## 2014-11-25 NOTE — Progress Notes (Signed)
Pt walked 444ft on 4l of o2 sat did not go lower than 90%

## 2014-11-25 NOTE — Progress Notes (Signed)
Discussed inhaler options with the patient. He would like to continue to take the [facility] inhaler as he does at home instead of our formulary option Dulera. He wants to be sure the [facility] inhaler works for him. Will change Dulera to patients own Breo.  Melburn Popper, PharmD Clinical Pharmacy Resident Pager: [phone] 11/25/2014 9:47 AM

## 2014-11-25 NOTE — Progress Notes (Signed)
Pt walked 443ft tolerated well

## 2014-11-25 NOTE — Progress Notes (Addendum)
      [address]       [employer] [address]             [phone]      5 Days Post-Op Procedure(s) (LRB): TRANSCATHETER AORTIC VALVE REPLACEMENT, TRANSAPICAL (N/A) TRANSESOPHAGEAL ECHOCARDIOGRAM (TEE) (N/A) RIB PLATING OF LEFT 8TH RIB (Left) Subjective: Feels ok , breathing is a bit more comfortable   Objective: Vital signs in last 24 hours: Temp:  [97.5 F (36.4 C)-98.6 F (37 C)] 98.6 F (37 C) (09/04 0453) Pulse Rate:  [79-89] 79 (09/04 0453) Cardiac Rhythm:  [-] Normal sinus rhythm (09/03 2218) Resp:  [18-23] 20 (09/04 0453) BP: (123-142)/(41-53) 138/51 mmHg (09/04 0453) SpO2:  [97 %-98 %] 98 % (09/04 0453) Weight:  [147 lb 11.3 oz (67 kg)] 147 lb 11.3 oz (67 kg) (09/04 0453)  Hemodynamic parameters for last 24 hours:    Intake/Output from previous day: 09/03 0701 - 09/04 0700 In: 600 [P.O.:600] Out: 700 [Urine:700] Intake/Output this shift:    General appearance: alert, cooperative and no distress Heart: regular rate and rhythm Lungs: sl coarse. improved air exchange c/w yesterday Abdomen: benign Extremities: minor edema Wound: incis healing well  Lab Results:  Recent Labs  11/23/14 0356 11/24/14 0342  WBC 14.5* 8.9  HGB 10.5* 10.2*  HCT 32.5* 31.9*  PLT 282 310   BMET:  Recent Labs  11/23/14 0356 11/24/14 0342  NA 132* 133*  K 4.3 4.1  CL 99* 98*  CO2 26 28  GLUCOSE 131* 105*  BUN 11 16  CREATININE 0.78 0.73  CALCIUM 8.2* 8.3*    PT/INR: No results for input(s): LABPROT, INR in the last 72 hours. ABG    Component Value Date/Time   PHART 7.425 11/20/2014 2352   HCO3 27.5* 11/20/2014 2352   TCO2 24 11/21/2014 1654   ACIDBASEDEF 2.0 11/20/2014 1715   O2SAT 94.0 11/20/2014 2352   CBG (last 3)  No results for input(s): GLUCAP in the last 72 hours.  Meds Scheduled Meds: . acetaminophen  1,000 mg Oral 4 times per day  . [START ON 11/26/2014] amiodarone  200 mg Oral BID PC  . amiodarone  400 mg Oral BID PC  .  amLODipine  10 mg Oral Daily  . antiseptic oral rinse  7 mL Mouth Rinse QID  . aspirin EC  81 mg Oral Daily  . atorvastatin  40 mg Oral Daily  . clopidogrel  75 mg Oral Q breakfast  . metoprolol tartrate  12.5 mg Oral BID  . mometasone-formoterol  2 puff Inhalation BID  . pantoprazole  40 mg Oral Daily  . potassium chloride  20 mEq Oral Daily  . sodium chloride  3 mL Intravenous Q12H  . sodium chloride  3 mL Intravenous Q12H   Continuous Infusions: . sodium chloride 20 mL/hr at 11/22/14 2000   PRN Meds:.sodium chloride, sodium chloride, diphenhydrAMINE, ipratropium-albuterol, metoprolol, ondansetron (ZOFRAN) IV, oxyCODONE, sodium chloride, sodium chloride  Xrays No results found.  Assessment/Plan: S/P Procedure(s) (LRB): TRANSCATHETER AORTIC VALVE REPLACEMENT, TRANSAPICAL (N/A) TRANSESOPHAGEAL ECHOCARDIOGRAM (TEE) (N/A) RIB PLATING OF LEFT 8TH RIB (Left)  1 doing ok 2 cont pulm rx/rehab, wean O2 off 3 BP better but still high , will restart diovan    LOS: 5 days    GOLD,WAYNE E 11/25/2014  Patient seen and examined, agree with above He looks much better today  Remo Lipps C. Roxan Hockey, MD [facility] [phone]

## 2014-11-25 NOTE — Progress Notes (Signed)
   Subjective: Mild dyspnea; No CP   Objective: Filed Vitals:   11/24/14 1926 11/24/14 1954 11/25/14 0453 11/25/14 0840  BP: 142/53  138/51   Pulse: 89  79   Temp: 98.1 F (36.7 C)  98.6 F (37 C)   TempSrc: Oral  Oral   Resp: 23  20   Height:      Weight:   147 lb 11.3 oz (67 kg)   SpO2: 97% 98% 98% 95%   Weight change: -14.1 oz (-0.4 kg)  Intake/Output Summary (Last 24 hours) at 11/25/14 1036 Last data filed at 11/25/14 0900  Gross per 24 hour  Intake    600 ml  Output    700 ml  Net   -100 ml    General: Alert, awake, oriented x3, in no acute distress Neck:  supple Heart: Regular rate and rhythm, no murmurs Lungs: Diminished BS throughout Exemities:  No edema.   Neuro: Grossly intact, nonfocal.  Tele  SR    Lab Results: No results found for this or any previous visit (from the past 24 hour(s)).  Studies/Results: No results found.  Medications:Reviewed  1  Aortic stenosis  S/p TAVR   Continue asa and plavix    2  Atrial fib    Remains in sinus; continue amiodarone.  3  Pulm  Significant COPD; continue bronchodilators  4  CAD  Continue ASA and statin    LOS: 5 days   Kirk Ruths 11/25/2014, 10:36 AM

## 2014-11-26 NOTE — Care Management Important Message (Signed)
Important Message  Patient Details  Name: Ronald Bell MRN: [id number] Date of Birth: [birth year]   Medicare Important Message Given:  Yes-second notification given    Loann Quill 11/26/2014, 8:52 AM

## 2014-11-26 NOTE — Discharge Instructions (Signed)
1. Please obtain vital signs at least one time daily 2.Please weigh the patient daily. If he or she continues to gain weight or develops lower extremity edema, contact the office at [phone]. 3. Ambulate patient at least three times daily  4. Please monitor wound daily, keep clean and dry and contact our office immediately if becoming infected

## 2014-11-26 NOTE — Discharge Summary (Addendum)
Physician Discharge Summary  Patient ID: Ronald Bell MRN: [id number] DOB/AGE: 05-19-1940 74 y.o.  Admit date: 11/20/2014 Discharge date: 11/27/2014  Admission Diagnoses:  Patient Active Problem List   Diagnosis Date Noted  . Former smoker 11/05/2014  . Unstable angina 10/19/2014  . Angiodysplasia of stomach and duodenum   . Angiodysplasia of small intestine, except duodenum without bleeding   . Melena 08/16/2014  . Preop cardiovascular exam 08/16/2014  . Aortic stenosis   . Coronary artery disease involving native coronary artery of native heart without angina pectoris   . Angiodysplasia of colon   . Acute blood loss anemia   . Severe aortic stenosis 06/21/2014  . Shortness of breath   . Aortic stenosis, severe 05/10/2014  . COPD exacerbation 04/16/2014  . Multiple pulmonary nodules 03/02/2014  . Iliac artery stenosis, left 11/14/2013  . Pain of left thigh 11/14/2013  . Cough 03/27/2013  . Pleural plaque with presence of asbestos 03/27/2013  . PVD (peripheral vascular disease) 10/18/2012  . Carotid artery stenosis 04/22/2012  . Postherpetic neuralgia 09/28/2011  . OTHER SPECIFIED DISEASE OF WHITE BLOOD CELLS 01/17/2010  . HEMOCCULT POSITIVE STOOL 01/17/2010  . Iron deficiency anemia 01/25/2009  . [facility] 01/25/2009  . ANGIODYSPLASIA-INTESTINE 01/25/2009  . PERSONAL HX COLONIC POLYPS 01/25/2009  . ANEMIA, SECONDARY TO BLOOD LOSS 12/03/2008  . GI BLEEDING 12/03/2008  . UNSPECIFIED ANEMIA 12/01/2008  . GERD 11/30/2008  . CAROTID BRUITS, BILATERAL 05/25/2008  . POLYP OF NASAL CAVITY 07/25/2007  . OTHER SPECIFIED DISORDERS OF ARTERIES&ARTERIOLES 06/24/2007  . NECK PAIN, LEFT 06/22/2007  . Benign neoplasm of colon 08/11/2006  . Hyperlipidemia 08/11/2006  . ERECTILE DYSFUNCTION 08/11/2006  . Essential hypertension 08/11/2006  . COPD GOLD III with min reversibilty  08/11/2006  . 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 BARRETT'S ESOPHAGUS, HX OF 08/11/2006  .  Smoker 08/11/2006   Discharge Diagnoses:   Patient Active Problem List   Diagnosis Date Noted  . Atrial fibrillation with rapid ventricular response 11/23/2014  . 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 Former smoker 11/05/2014  . Unstable angina 10/19/2014  . Angiodysplasia of stomach and duodenum   . Angiodysplasia of small intestine, except duodenum without bleeding   . Melena 08/16/2014  . Preop cardiovascular exam 08/16/2014  . Aortic stenosis   . Coronary artery disease involving native coronary artery of native heart without angina pectoris   . Angiodysplasia of colon   . Acute blood loss anemia   . Severe aortic stenosis 06/21/2014  . Shortness of breath   . Aortic stenosis, severe 05/10/2014  . COPD exacerbation 04/16/2014  . Multiple pulmonary nodules 03/02/2014  . Iliac artery stenosis, left 11/14/2013  . Pain of left thigh 11/14/2013  . Cough 03/27/2013  . Pleural plaque with presence of asbestos 03/27/2013  . PVD (peripheral vascular disease) 10/18/2012  . Carotid artery stenosis 04/22/2012  . Postherpetic neuralgia 09/28/2011  . OTHER SPECIFIED DISEASE OF WHITE BLOOD CELLS 01/17/2010  . HEMOCCULT POSITIVE STOOL 01/17/2010  . Iron deficiency anemia 01/25/2009  . [facility] 01/25/2009  . ANGIODYSPLASIA-INTESTINE 01/25/2009  . PERSONAL HX COLONIC POLYPS 01/25/2009  . ANEMIA, SECONDARY TO BLOOD LOSS 12/03/2008  . GI BLEEDING 12/03/2008  . UNSPECIFIED ANEMIA 12/01/2008  . GERD 11/30/2008  . CAROTID BRUITS, BILATERAL 05/25/2008  . POLYP OF NASAL CAVITY 07/25/2007  . OTHER SPECIFIED DISORDERS OF ARTERIES&ARTERIOLES 06/24/2007  . NECK PAIN, LEFT 06/22/2007  . Benign neoplasm of colon 08/11/2006  . Hyperlipidemia 08/11/2006  . ERECTILE DYSFUNCTION 08/11/2006  . Essential  hypertension 08/11/2006  . COPD GOLD III with min reversibilty  08/11/2006  . 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 BARRETT'S ESOPHAGUS, HX OF 08/11/2006  . Smoker  08/11/2006   Discharged Condition: good  History of Present Illness:  Mr. Ronald Bell is a 74 yo white male with history of aortic stenosis, severe COPD, hypertension, peripheral vascular disease, hyperlipidemia, and iron deficient anemia with GI bleeding from angiodysplasia in the past. The patient states that he has been told he has a heart murmur for most of his life. He has been followed by Dr. Marlou Bell for the last several years with known history of aortic stenosis. An echocardiogram performed 01/29/2012 revealed what was felt to be moderate aortic stenosis with normal left ventricular systolic function based upon peak velocity across the aortic valve measured 3.4 m/s corresponding to a mean transvalvular gradient estimated at 28 mmHg. This past December the patient underwent left knee arthroscopy for torn cartilage under general anesthesia. The patient tolerated the procedure well, but within a few weeks he began to experience progressive symptoms of shortness of breath. Transthoracic echocardiogram performed 04/06/2014 revealed the presence of severe aortic stenosis based upon peak velocity across the aortic valve that ranged between 3.5 and 3.8 m/s corresponding to peak-to-peak and mean transvalvular gradients estimated to be 60 and 34 mmHg, respectively. Aortic valve area was calculated to be 0.8 cm. Left ventricular systolic function remained preserved with ejection fraction estimated 55%. Shortly after that the patient was hospitalized on 04/16/2014 with acute hypoxic respiratory failure. He was initially treated using BiPAP and he recovered quickly. He was diagnosed with presumed flare of COPD. Symptoms continued to improve with bronchodilators, antibiotics, and steroids. He was seen in follow-up shortly after hospital discharge by Dr. Marlou Bell who felt concerned that the patient's aortic stenosis might be contributing to his problems with dyspnea. The patient was referred for elective  surgical consultation at that time, but he decided to postpone consultation until after he had been seen in follow-up by his pulmonologist, Dr. Melvyn Bell. On 05/31/2014 the patient developed another acute exacerbation of resting shortness of breath and dry non-productive cough for which he was briefly hospitalized. He was again treated for presumed flare of COPD and discharged from the hospital. The patient was seen in follow-up by Dr. Melvyn Bell on 06/04/2014 who felt concerned that the patient's symptoms of progressive shortness of breath with orthopnea and PND sounded concerning for likely congestive heart failure.  Due to the above problems the patient was referred to [facility] for elective surgical consultation.  He was originally evaluated by Dr. Roxy Manns in March of this year.  At that time it was felt the patient would be a poor candidate for conventional Aortic Valve Replacement.  Therefore, he underwent evaluation by a multidisciplinary team of doctors who felt he would benefit from a TAVR procedure.  He was found to have severe CAD of his RCA  It was felt this would be amenable to PCI.  However prior to this being completed the patient required treatment for a GI bleed.  Once resolved he underwent PCI with a bare metal stent to his RCA in  July.  He has been on dual antiplatelet therapy since that time with no further GI bleeding.  Therefore he was again evaluated by Dr. Roxy Manns on 11/19/2014 at which time it was felt the patient was medically stable to proceed with TAVR procedure.  The risks and benefits of the procedure were explained to the patient and he was agreeable to proceed.  Hospital  Course:   Mr. Vanauken presented to [facility] on 11/20/2014.  He was taken to the operating room and underwent a TAVR procedure via the Transapical approach utilizing a 26 mm Edwards Sapien XT THV valve prosthesis.  He tolerated the procedure without difficulty and was taken to the SICU in stable condition.  He was  extubated the evening of surgery.  During his stay in the SICU he was diuresed with Lasix for hypervolemia.  He was weaned off Neo drip as tolerated.  He has severe COPD and his respiratory status was monitored closely.  He developed Rapid Atrial Fibrillation.  He was treated with Amiodarone with successful conversion to NSR.  The patient's respiratory status continued to improve slowly.  He continued to maintain NSR and was felt medically stable for transfer to the step down unit on POD # 4.  The patient continues to progress.  He is maintaining NSR.  He remains on oxygen and we anticipate continued use at discharge.  We will continue attempts to wean as tolerated.  He is ambulating with assistance, but will require SNF at discharge.  He is agreeable to this and arrangements have made.  He has required restarting of his home blood pressure medications due hypertension.  These have worked well for him.  Should he continue to progress we anticipate discharge to SNF tomorrow 11/27/2014.        Treatments: surgery:    Transcatheter Aortic Valve Replacement - Transapical Approach Edwards Sapien XT THV (size 26 mm, model # 9300TFX, serial # [id number])   Disposition: SNF    Discharge Medications:  The patient has been discharged on:   1.Beta Blocker:  Yes [x   ]                              No   [   ]                              If No, reason:  2.Ace Inhibitor/ARB: Yes [  x ]                                     No  [    ]                                     If No, reason:  3.Statin:   Yes [ x  ]                  No  [   ]                  If No, reason:  4.Ecasa:  Yes  [ x  ]                  No   [   ]                  If No, reason:    Medication List    STOP taking these medications        Iron 325 (65 FE) MG Tabs      TAKE these medications        amiodarone 200 MG tablet  Commonly known as:  PACERONE  Take 1 tablet (200 mg total) by mouth 2 (two) times daily after  a meal.     amLODipine 10 MG tablet  Commonly known as:  NORVASC  Take 1 tablet (10 mg total) by mouth daily.     aspirin 81 MG chewable tablet  Chew 1 tablet (81 mg total) by mouth daily.     atorvastatin 40 MG tablet  Commonly known as:  LIPITOR  TAKE ONE TABLET BY MOUTH ONCE DAILY     BREO ELLIPTA 100-25 MCG/INH Aepb  Generic drug:  Fluticasone Furoate-Vilanterol  INHALE ONE PUFF INTO LUNGS ONCE DAILY IN THE MORNING     CENTRUM SILVER PO  Take 1 tablet by mouth daily.     clopidogrel 75 MG tablet  Commonly known as:  PLAVIX  Take 1 tablet (75 mg total) by mouth daily with breakfast.     docusate sodium 100 MG capsule  Commonly known as:  COLACE  Take 100 mg by mouth 2 (two) times daily.     fenofibrate 160 MG tablet  Take 1 tablet (160 mg total) by mouth daily.     Ipratropium-Albuterol 20-100 MCG/ACT Aers respimat  Commonly known as:  COMBIVENT RESPIMAT  Inhale 1 puff into the lungs 4 (four) times daily as needed for wheezing.     metoprolol tartrate 25 MG tablet  Commonly known as:  LOPRESSOR  Take 0.5 tablets (12.5 mg total) by mouth 2 (two) times daily.     oxyCODONE 5 MG immediate release tablet  Commonly known as:  Oxy IR/ROXICODONE  Take 1-2 tablets (5-10 mg total) by mouth every 6 (six) hours as needed for severe pain.     pantoprazole 40 MG tablet  Commonly known as:  PROTONIX  TAKE ONE TABLET BY MOUTH ONCE DAILY     valsartan-hydrochlorothiazide 160-12.5 MG per tablet  Commonly known as:  DIOVAN HCT  Take 1 tablet by mouth daily.     vitamin C 250 MG tablet  Commonly known as:  ASCORBIC ACID  Take 500 mg by mouth daily.     VOLTAREN 1 % Gel  Generic drug:  diclofenac sodium  Apply 2 g topically 4 (four) times daily as needed (for arthritis).       Follow-up Information    Follow up with [facility] [place] On 12/10/2014.   Why:  Appointment is at 1:00, For wound re-check   Contact information:   [address] [place]  [phone]       Follow up with [name], MD On 12/24/2014.   Specialty:  Cardiothoracic Surgery   Why:  Appointment is at 1:30   Contact information:   [place] [address] [phone]       Follow up with [facility] On 12/24/2014.   Specialty:  Cardiology   Why:  Appointment is at 11:00   Contact information:   [address] [id number] [place] [phone]     Other instructions: We ask the SNF to please do the following: 1. Please obtain vital signs at least one time daily 2.Please weigh the patient daily. If he or she continues to gain weight or develops lower extremity edema, contact the office at [phone]. 3. Ambulate patient at least three times daily and please use sternal precautions. 4 keep on O2 2 liters [place]- wean for sats with ambulation>90%  Signed:  Ohn Bostic M PA-C 11/27/2014, 10:41 AM

## 2014-11-26 NOTE — Progress Notes (Signed)
Pt assisted to walk in hall approx 150 ft on 2L O2 without complaint, using RW.  SPO2 on return was initially 89% but rose to 91% in less than 2 mins sitting.  Pt resting comfortably in bed after walk with call bell in reach and no further needs.  Will con't plan of care.

## 2014-11-26 NOTE — Progress Notes (Addendum)
      [address]       [place] [address]             [phone]      6 Days Post-Op Procedure(s) (LRB): TRANSCATHETER AORTIC VALVE REPLACEMENT, TRANSAPICAL (N/A) TRANSESOPHAGEAL ECHOCARDIOGRAM (TEE) (N/A) RIB PLATING OF LEFT 8TH RIB (Left)   Subjective:  No New complaints.  He states he feels he is breathing a little better.  He is hoping to be discharged to SNF tomorrow.  Objective: Vital signs in last 24 hours: Temp:  [97.8 F (36.6 C)-97.9 F (36.6 C)] 97.9 F (36.6 C) (09/05 0421) Pulse Rate:  [76-83] 81 (09/05 0421) Cardiac Rhythm:  [-] Normal sinus rhythm (09/04 2250) Resp:  [18-20] 20 (09/05 0532) BP: (95-156)/(56-81) 137/56 mmHg (09/05 0421) SpO2:  [94 %-97 %] 97 % (09/05 0532) Weight:  [147 lb 4.3 oz (66.8 kg)] 147 lb 4.3 oz (66.8 kg) (09/05 0421)  Intake/Output from previous day: 09/04 0701 - 09/05 0700 In: 480 [P.O.:480] Out: 2080 [Urine:2080]  General appearance: alert, cooperative and no distress Heart: regular rate and rhythm Lungs: clear to auscultation bilaterally Abdomen: soft, non-tender; bowel sounds normal; no masses,  no organomegaly Extremities: edema trace Wound: clean and dry  Lab Results:  Recent Labs  11/24/14 0342  WBC 8.9  HGB 10.2*  HCT 31.9*  PLT 310   BMET:  Recent Labs  11/24/14 0342  NA 133*  K 4.1  CL 98*  CO2 28  GLUCOSE 105*  BUN 16  CREATININE 0.73  CALCIUM 8.3*    PT/INR: No results for input(s): LABPROT, INR in the last 72 hours. ABG    Component Value Date/Time   PHART 7.425 11/20/2014 2352   HCO3 27.5* 11/20/2014 2352   TCO2 24 11/21/2014 1654   ACIDBASEDEF 2.0 11/20/2014 1715   O2SAT 94.0 11/20/2014 2352   CBG (last 3)  No results for input(s): GLUCAP in the last 72 hours.  Assessment/Plan: S/P Procedure(s) (LRB): TRANSCATHETER AORTIC VALVE REPLACEMENT, TRANSAPICAL (N/A) TRANSESOPHAGEAL ECHOCARDIOGRAM (TEE) (N/A) RIB PLATING OF LEFT 8TH RIB (Left)  1. CV- maintaining NSR,  BP improved- continue Amiodarone, Norvasc, HCTZ, Avapro, and Lopressor 2. Pulm- feels breathing is a little better, wean oxygen as tolerated, on home inhalers, encouraged use of IS 3. Deconditioning- SNF placement at discharge 4. Dispo- patient doing better, maintaining NSR, weaning oxygen as tolerated, possibly ready for d/c to SNF in AM  LOS: 6 days    BARRETT, ERIN 11/26/2014  Patient seen and examined, agree with above Should be ready for SNF tomorrow  Remo Lipps C. Roxan Hockey, MD [facility] [phone]

## 2014-11-26 NOTE — Progress Notes (Signed)
Pt ambulated 300 ft on 2L with RW, doubling distance from AM.  SPO2 again 88% after walk but quickly rising to 91% with rest.  Pt expressed feeling that recent nebulizer tx enabled his increased stamina.  Will con't plan of care

## 2014-11-26 NOTE — Progress Notes (Signed)
     SUBJECTIVE: Still mild SOB. Chest wall pain  BP 137/56 mmHg  Pulse 81  Temp(Src) 97.9 F (36.6 C) (Oral)  Resp 20  Ht 5\' 3"  (1.6 m)  Wt 147 lb 4.3 oz (66.8 kg)  BMI 26.09 kg/m2  SpO2 97%  Intake/Output Summary (Last 24 hours) at 11/26/14 0810 Last data filed at 11/26/14 0300  Gross per 24 hour  Intake    480 ml  Output   2080 ml  Net  -1600 ml    PHYSICAL EXAM General: Well developed, well nourished, in no acute distress. Alert and oriented x 3.  Psych:  Good affect, responds appropriately Neck: No JVD. No masses noted.  Lungs: Clear bilaterally with no wheezes or rhonci noted.  Heart: RRR with no murmurs noted. Abdomen: Bowel sounds are present. Soft, non-tender.  Extremities: No lower extremity edema.   LABS: Basic Metabolic Panel:  Recent Labs  11/24/14 0342  NA 133*  K 4.1  CL 98*  CO2 28  GLUCOSE 105*  BUN 16  CREATININE 0.73  CALCIUM 8.3*   CBC:  Recent Labs  11/24/14 0342  WBC 8.9  HGB 10.2*  HCT 31.9*  MCV 98.2  PLT 310    Current Meds: . amiodarone  200 mg Oral BID PC  . amLODipine  10 mg Oral Daily  . antiseptic oral rinse  7 mL Mouth Rinse QID  . aspirin EC  81 mg Oral Daily  . atorvastatin  40 mg Oral Daily  . clopidogrel  75 mg Oral Q breakfast  . Fluticasone Furoate-Vilanterol  1 puff Inhalation Daily  . hydrochlorothiazide  12.5 mg Oral Daily  . irbesartan  150 mg Oral Daily  . metoprolol tartrate  12.5 mg Oral BID  . pantoprazole  40 mg Oral Daily  . sodium chloride  3 mL Intravenous Q12H  . sodium chloride  3 mL Intravenous Q12H     ASSESSMENT AND PLAN:  1. Severe aortic valve stenosis: s/p TAVR, POD # 6. Stable. Echo 11/21/14 with normally functioning bioprosthetic valve. He is on Plavix  2. CAD: Stable on ASA, Plavix, beta blocker.   3.  Post op atrial fib: Maintaining sinus on amiodarone. Would not start anti-coagulation unless he has recurrence of atrial fibrillation  4. COPD, severe: Improving from  symptomatic standpoint. He will need continuous supplemental oxygen at time of discharge based on low O2 sats with ambulation, even while wearing O2.   Likely to SNF tomorrow.   Ronald Bell  9/5/20168:10 AM

## 2014-11-27 DIAGNOSIS — K5520 Angiodysplasia of colon without hemorrhage: Secondary | ICD-10-CM | POA: Diagnosis not present

## 2014-11-27 DIAGNOSIS — Z8719 Personal history of other diseases of the digestive system: Secondary | ICD-10-CM | POA: Diagnosis not present

## 2014-11-27 DIAGNOSIS — E861 Hypovolemia: Secondary | ICD-10-CM | POA: Diagnosis present

## 2014-11-27 DIAGNOSIS — I509 Heart failure, unspecified: Secondary | ICD-10-CM | POA: Diagnosis present

## 2014-11-27 DIAGNOSIS — R0602 Shortness of breath: Secondary | ICD-10-CM | POA: Diagnosis not present

## 2014-11-27 DIAGNOSIS — M1990 Unspecified osteoarthritis, unspecified site: Secondary | ICD-10-CM | POA: Diagnosis not present

## 2014-11-27 DIAGNOSIS — K219 Gastro-esophageal reflux disease without esophagitis: Secondary | ICD-10-CM | POA: Diagnosis not present

## 2014-11-27 DIAGNOSIS — Z77090 Contact with and (suspected) exposure to asbestos: Secondary | ICD-10-CM | POA: Diagnosis present

## 2014-11-27 DIAGNOSIS — I2510 Atherosclerotic heart disease of native coronary artery without angina pectoris: Secondary | ICD-10-CM | POA: Diagnosis not present

## 2014-11-27 DIAGNOSIS — K558 Other vascular disorders of intestine: Secondary | ICD-10-CM | POA: Diagnosis not present

## 2014-11-27 DIAGNOSIS — Z955 Presence of coronary angioplasty implant and graft: Secondary | ICD-10-CM | POA: Diagnosis not present

## 2014-11-27 DIAGNOSIS — I358 Other nonrheumatic aortic valve disorders: Secondary | ICD-10-CM | POA: Diagnosis not present

## 2014-11-27 DIAGNOSIS — I25810 Atherosclerosis of coronary artery bypass graft(s) without angina pectoris: Secondary | ICD-10-CM | POA: Diagnosis not present

## 2014-11-27 DIAGNOSIS — E876 Hypokalemia: Secondary | ICD-10-CM | POA: Diagnosis not present

## 2014-11-27 DIAGNOSIS — K921 Melena: Secondary | ICD-10-CM | POA: Diagnosis present

## 2014-11-27 DIAGNOSIS — I6529 Occlusion and stenosis of unspecified carotid artery: Secondary | ICD-10-CM | POA: Diagnosis present

## 2014-11-27 DIAGNOSIS — Z79899 Other long term (current) drug therapy: Secondary | ICD-10-CM | POA: Diagnosis not present

## 2014-11-27 DIAGNOSIS — K5521 Angiodysplasia of colon with hemorrhage: Secondary | ICD-10-CM | POA: Diagnosis present

## 2014-11-27 DIAGNOSIS — J9601 Acute respiratory failure with hypoxia: Secondary | ICD-10-CM | POA: Diagnosis not present

## 2014-11-27 DIAGNOSIS — E785 Hyperlipidemia, unspecified: Secondary | ICD-10-CM | POA: Diagnosis not present

## 2014-11-27 DIAGNOSIS — K5900 Constipation, unspecified: Secondary | ICD-10-CM | POA: Diagnosis not present

## 2014-11-27 DIAGNOSIS — D649 Anemia, unspecified: Secondary | ICD-10-CM | POA: Diagnosis not present

## 2014-11-27 DIAGNOSIS — Z5189 Encounter for other specified aftercare: Secondary | ICD-10-CM | POA: Diagnosis not present

## 2014-11-27 DIAGNOSIS — D500 Iron deficiency anemia secondary to blood loss (chronic): Secondary | ICD-10-CM | POA: Diagnosis not present

## 2014-11-27 DIAGNOSIS — Z87891 Personal history of nicotine dependence: Secondary | ICD-10-CM | POA: Diagnosis not present

## 2014-11-27 DIAGNOSIS — J9620 Acute and chronic respiratory failure, unspecified whether with hypoxia or hypercapnia: Secondary | ICD-10-CM | POA: Diagnosis present

## 2014-11-27 DIAGNOSIS — Z954 Presence of other heart-valve replacement: Secondary | ICD-10-CM | POA: Diagnosis not present

## 2014-11-27 DIAGNOSIS — I4891 Unspecified atrial fibrillation: Secondary | ICD-10-CM | POA: Diagnosis not present

## 2014-11-27 DIAGNOSIS — R262 Difficulty in walking, not elsewhere classified: Secondary | ICD-10-CM | POA: Diagnosis not present

## 2014-11-27 DIAGNOSIS — F419 Anxiety disorder, unspecified: Secondary | ICD-10-CM | POA: Diagnosis present

## 2014-11-27 DIAGNOSIS — Z7982 Long term (current) use of aspirin: Secondary | ICD-10-CM | POA: Diagnosis not present

## 2014-11-27 DIAGNOSIS — I350 Nonrheumatic aortic (valve) stenosis: Secondary | ICD-10-CM | POA: Diagnosis not present

## 2014-11-27 DIAGNOSIS — K210 Gastro-esophageal reflux disease with esophagitis: Secondary | ICD-10-CM | POA: Diagnosis not present

## 2014-11-27 DIAGNOSIS — K254 Chronic or unspecified gastric ulcer with hemorrhage: Secondary | ICD-10-CM | POA: Diagnosis not present

## 2014-11-27 DIAGNOSIS — D125 Benign neoplasm of sigmoid colon: Secondary | ICD-10-CM | POA: Diagnosis present

## 2014-11-27 DIAGNOSIS — R5381 Other malaise: Secondary | ICD-10-CM | POA: Diagnosis not present

## 2014-11-27 DIAGNOSIS — J449 Chronic obstructive pulmonary disease, unspecified: Secondary | ICD-10-CM | POA: Diagnosis not present

## 2014-11-27 DIAGNOSIS — I10 Essential (primary) hypertension: Secondary | ICD-10-CM | POA: Diagnosis not present

## 2014-11-27 DIAGNOSIS — J90 Pleural effusion, not elsewhere classified: Secondary | ICD-10-CM | POA: Diagnosis present

## 2014-11-27 DIAGNOSIS — K922 Gastrointestinal hemorrhage, unspecified: Secondary | ICD-10-CM | POA: Diagnosis not present

## 2014-11-27 DIAGNOSIS — Z7902 Long term (current) use of antithrombotics/antiplatelets: Secondary | ICD-10-CM | POA: Diagnosis not present

## 2014-11-27 DIAGNOSIS — Z8249 Family history of ischemic heart disease and other diseases of the circulatory system: Secondary | ICD-10-CM | POA: Diagnosis not present

## 2014-11-27 DIAGNOSIS — I959 Hypotension, unspecified: Secondary | ICD-10-CM | POA: Diagnosis present

## 2014-11-27 DIAGNOSIS — D62 Acute posthemorrhagic anemia: Secondary | ICD-10-CM | POA: Diagnosis not present

## 2014-11-27 DIAGNOSIS — Z952 Presence of prosthetic heart valve: Secondary | ICD-10-CM | POA: Diagnosis not present

## 2014-11-27 DIAGNOSIS — M6281 Muscle weakness (generalized): Secondary | ICD-10-CM | POA: Diagnosis not present

## 2014-11-27 DIAGNOSIS — I739 Peripheral vascular disease, unspecified: Secondary | ICD-10-CM | POA: Diagnosis present

## 2014-11-27 DIAGNOSIS — J3489 Other specified disorders of nose and nasal sinuses: Secondary | ICD-10-CM | POA: Diagnosis not present

## 2014-11-27 MED ORDER — OXYCODONE HCL 5 MG PO TABS: 5.0000 mg | ORAL_TABLET | Freq: Four times a day (QID) | ORAL | Status: DC | PRN

## 2014-11-27 MED ORDER — AMIODARONE HCL 200 MG PO TABS: 200.0000 mg | ORAL_TABLET | Freq: Two times a day (BID) | ORAL | Status: DC

## 2014-11-27 NOTE — Progress Notes (Signed)
Patient will discharge to [facility] Anticipated discharge date:11/27/14 Transportation by [facility]- scheduled at [place] signing off.  Domenica Reamer, [place] Social Worker [phone]

## 2014-11-27 NOTE — Progress Notes (Signed)
CARDIAC REHAB PHASE I   PRE:  Rate/Rhythm: 88 SR  BP:  Supine:   Sitting: 135/71  Standing:    SaO2: 93% 2L  MODE:  Ambulation: 300 ft   POST:  Rate/Rhythm: 97 SR  BP:  Supine:   Sitting: 146/47  Standing:    SaO2: 91% 2L hall, 90% room 0922-0950 Pt walked 300 ft on 2L with rolling walker and minimal asst. Pt good at using pursed lip breathing during walk. Kept sats up on 2L. Gave pt ex ed walking instructions. Encouraged IS. Stopped once to take standing break to rest. Pt got breathing treatment prior to walk.   Graylon Good, RN BSN  11/27/2014 10:07 AM

## 2014-11-27 NOTE — Progress Notes (Addendum)
      [address]       [place] [address]             [phone]      7 Days Post-Op Procedure(s) (LRB): TRANSCATHETER AORTIC VALVE REPLACEMENT, TRANSAPICAL (N/A) TRANSESOPHAGEAL ECHOCARDIOGRAM (TEE) (N/A) RIB PLATING OF LEFT 8TH RIB (Left) Subjective: conts to feel better and breathing is comfortable but conts tp require O2  Objective: Vital signs in last 24 hours: Temp:  [97.8 F (36.6 C)-98.2 F (36.8 C)] 97.8 F (36.6 C) (09/06 0430) Pulse Rate:  [57-89] 83 (09/06 0430) Cardiac Rhythm:  [-] Normal sinus rhythm (09/05 1930) Resp:  [18-20] 18 (09/06 0430) BP: (99-135)/(54-99) 134/99 mmHg (09/06 0430) SpO2:  [92 %-96 %] 96 % (09/06 0430) Weight:  [145 lb 11.2 oz (66.089 kg)] 145 lb 11.2 oz (66.089 kg) (09/06 0430)  Hemodynamic parameters for last 24 hours:    Intake/Output from previous day: 09/05 0701 - 09/06 0700 In: 840 [P.O.:840] Out: 2250 [Urine:2250] Intake/Output this shift:    General appearance: alert, cooperative and no distress Heart: regular rate and rhythm Lungs: clear to auscultation bilaterally Abdomen: benign Extremities: minor LE edema Wound: incis healing well  Lab Results: No results for input(s): WBC, HGB, HCT, PLT in the last 72 hours. BMET: No results for input(s): NA, K, CL, CO2, GLUCOSE, BUN, CREATININE, CALCIUM in the last 72 hours.  PT/INR: No results for input(s): LABPROT, INR in the last 72 hours. ABG    Component Value Date/Time   PHART 7.425 11/20/2014 2352   HCO3 27.5* 11/20/2014 2352   TCO2 24 11/21/2014 1654   ACIDBASEDEF 2.0 11/20/2014 1715   O2SAT 94.0 11/20/2014 2352   CBG (last 3)  No results for input(s): GLUCAP in the last 72 hours.  Meds Scheduled Meds: . amiodarone  200 mg Oral BID PC  . amLODipine  10 mg Oral Daily  . antiseptic oral rinse  7 mL Mouth Rinse QID  . aspirin EC  81 mg Oral Daily  . atorvastatin  40 mg Oral Daily  . clopidogrel  75 mg Oral Q breakfast  . Fluticasone  Furoate-Vilanterol  1 puff Inhalation Daily  . hydrochlorothiazide  12.5 mg Oral Daily  . irbesartan  150 mg Oral Daily  . metoprolol tartrate  12.5 mg Oral BID  . pantoprazole  40 mg Oral Daily  . sodium chloride  3 mL Intravenous Q12H  . sodium chloride  3 mL Intravenous Q12H   Continuous Infusions: . sodium chloride 20 mL/hr at 11/22/14 2000   PRN Meds:.sodium chloride, sodium chloride, diphenhydrAMINE, ipratropium-albuterol, metoprolol, ondansetron (ZOFRAN) IV, oxyCODONE, sodium chloride, sodium chloride  Xrays No results found.  Assessment/Plan: S/P Procedure(s) (LRB): TRANSCATHETER AORTIC VALVE REPLACEMENT, TRANSAPICAL (N/A) TRANSESOPHAGEAL ECHOCARDIOGRAM (TEE) (N/A) RIB PLATING OF LEFT 8TH RIB (Left)  1 conts to do well 2 will need O2 at discharge to SNF- appears ready   LOS: 7 days    GOLD,WAYNE E 11/27/2014  I have seen and examined the patient and agree with the assessment and plan as outlined.  Looks very good - ready for d/c to SNF today.  I spent in excess of 15 minutes during the conduct of this hospital encounter and >50% of this time involved direct face-to-face encounter with the patient for counseling and/or coordination of their care.   [name] 11/27/2014 8:20 AM

## 2014-11-27 NOTE — Progress Notes (Signed)
Pt continues on 2LO2 via nasal canula. He has episodes of hyperventilation and needs reminders to take slow deep breaths. His oxygen level has been in the low to mid 90s on 2L O2. He is being discharged to [facility]. 1 Suture to left abdominal site removed and steri- strips placed at site. Pt tolerated well. Ambulance here to transport pt to facility.

## 2014-11-27 NOTE — Progress Notes (Signed)
UR Completed. Yasaman Kolek, RN, BSN.  [phone] 

## 2014-11-27 NOTE — Progress Notes (Signed)
PT Cancellation Note  Patient Details Name: JAYZON TARAS MRN: [id number] DOB: Oct 18, 1940   Cancelled Treatment:    Reason Eval/Treat Not Completed: Per RN pt to d/c to SNF today. Noted pt walked 300' with cardiac rehab this morning. Will defer further physical therapy treatment to SNF.    Rolinda Roan 11/27/2014, 11:23 AM   Rolinda Roan, PT, DPT Acute Rehabilitation Services Pager: [phone]

## 2014-11-27 NOTE — Progress Notes (Signed)
     SUBJECTIVE: No chest pain. Breathing is better.   BP 134/99 mmHg  Pulse 83  Temp(Src) 97.8 F (36.6 C) (Oral)  Resp 18  Ht 5\' 3"  (1.6 m)  Wt 145 lb 11.2 oz (66.089 kg)  BMI 25.82 kg/m2  SpO2 96%  Intake/Output Summary (Last 24 hours) at 11/27/14 0805 Last data filed at 11/27/14 0431  Gross per 24 hour  Intake    840 ml  Output   2250 ml  Net  -1410 ml    PHYSICAL EXAM General: Well developed, well nourished, in no acute distress. Alert and oriented x 3.  Psych:  Good affect, responds appropriately Neck: No JVD. No masses noted.  Lungs: Clear bilaterally with no wheezes or rhonci noted.  Heart: RRR with no murmurs noted. Abdomen: Bowel sounds are present. Soft, non-tender.  Extremities: No lower extremity edema.   LABS:  Current Meds: . amiodarone  200 mg Oral BID PC  . amLODipine  10 mg Oral Daily  . antiseptic oral rinse  7 mL Mouth Rinse QID  . aspirin EC  81 mg Oral Daily  . atorvastatin  40 mg Oral Daily  . clopidogrel  75 mg Oral Q breakfast  . Fluticasone Furoate-Vilanterol  1 puff Inhalation Daily  . hydrochlorothiazide  12.5 mg Oral Daily  . irbesartan  150 mg Oral Daily  . metoprolol tartrate  12.5 mg Oral BID  . pantoprazole  40 mg Oral Daily  . sodium chloride  3 mL Intravenous Q12H  . sodium chloride  3 mL Intravenous Q12H     ASSESSMENT AND PLAN:  1. Severe aortic valve stenosis: s/p TAVR, POD # 7. Stable. Echo 11/21/14 with normally functioning bioprosthetic valve. He is on Plavix  2. CAD: Stable on ASA, Plavix, beta blocker.   3. Post op atrial fib: Maintaining sinus on amiodarone. Would not start anti-coagulation unless he has recurrence of atrial fibrillation  4. COPD, severe: Improving from symptomatic standpoint. He will need continuous supplemental oxygen at time of discharge based on low O2 sats with ambulation, even while wearing O2.   Likely to SNF today. He will need 1-2 week follow up with Dr. Roxy Manns and 30 day follow up with  me. I will arrange follow up in my office.   Niyonna Betsill  9/6/20168:05 AM

## 2014-11-28 ENCOUNTER — Non-Acute Institutional Stay (SKILLED_NURSING_FACILITY): Payer: Medicare Other | Admitting: Adult Health

## 2014-11-28 ENCOUNTER — Encounter: Payer: Self-pay | Admitting: Adult Health

## 2014-11-28 DIAGNOSIS — E785 Hyperlipidemia, unspecified: Secondary | ICD-10-CM

## 2014-11-28 DIAGNOSIS — R5381 Other malaise: Secondary | ICD-10-CM | POA: Diagnosis not present

## 2014-11-28 DIAGNOSIS — D62 Acute posthemorrhagic anemia: Secondary | ICD-10-CM | POA: Diagnosis not present

## 2014-11-28 DIAGNOSIS — M1990 Unspecified osteoarthritis, unspecified site: Secondary | ICD-10-CM | POA: Diagnosis not present

## 2014-11-28 DIAGNOSIS — I10 Essential (primary) hypertension: Secondary | ICD-10-CM | POA: Diagnosis not present

## 2014-11-28 DIAGNOSIS — Z8719 Personal history of other diseases of the digestive system: Secondary | ICD-10-CM | POA: Diagnosis not present

## 2014-11-28 DIAGNOSIS — I350 Nonrheumatic aortic (valve) stenosis: Secondary | ICD-10-CM | POA: Diagnosis not present

## 2014-11-28 DIAGNOSIS — K5900 Constipation, unspecified: Secondary | ICD-10-CM

## 2014-11-28 DIAGNOSIS — I2510 Atherosclerotic heart disease of native coronary artery without angina pectoris: Secondary | ICD-10-CM | POA: Diagnosis not present

## 2014-11-28 DIAGNOSIS — J449 Chronic obstructive pulmonary disease, unspecified: Secondary | ICD-10-CM | POA: Diagnosis not present

## 2014-11-28 DIAGNOSIS — I4891 Unspecified atrial fibrillation: Secondary | ICD-10-CM

## 2014-11-28 NOTE — Progress Notes (Signed)
Patient ID: Ronald Bell, male   DOB: 1940/05/22, 74 y.o.   MRN: [id number]     DATE:  11/28/2014 MRN:  [id number]  BIRTHDAY: March 19, 1941  Facility:  Nursing Home Location:  [place] Room Number: 703-P  LEVEL OF CARE:  SNF (31)  Contact Information    Name [place] Spouse [phone]         Chief Complaint  Patient presents with  . Hospitalization Follow-up    Aortic stenosis S/P transcatheter aortic valve replacement, CAD, hypertension, hyperlipidemia, COPD, atrial fibrillation with RVR, history of GI bleed, arthritis, constipation and anemia    HISTORY OF PRESENT ILLNESS:  This is a 74 year old male who has been admitted to [facility] on 11/27/14 from [facility]. He has PMH of aortic stenosis, severe COPD, hypertension, PVD, hyperlipidemia and iron deficiency anemia with GI bleed from angiodysplasia in the past. He has severe aortic stenosis for which she had transcatheter aortic valve replacement on 8/30.  He has been admitted for a short-term rehabilitation.   PAST MEDICAL HISTORY:  Past Medical History  Diagnosis Date  . Polyp of nasal cavity   . Rosacea   . Benign neoplasm of colon   . Tobacco abuse   . Psychosexual dysfunction with inhibited sexual excitement   . Hypertension   . Hyperlipidemia   . GERD (gastroesophageal reflux disease)   . GI bleed 2010    4 units PRBCs  . Adenomatous colon polyp   . Peripheral vascular disease   . Irregular heartbeat   . Shingles   . Carotid artery occlusion   . Angiodysplasia of intestine with hemorrhage   . Pleural plaque with presence of asbestos 03/27/2013    Followed in Pulmonary clinic/ [facility]/ [place] - F/u CT 09/08/2013 1. Stable extensive calcified pleural plaque formation consistent with asbestos exposure. 2. Multiple pulmonary nodules are unchanged from the CT of 6 months ago. Given risk factors for lung cancer, continued  follow up is recommended with chest CT in 6 months> done 04/20/14 no change >repeat in 12 m in tickle file     . GERD 11/30/2008    Qualifier: Diagnosis of  By: Marijean Niemann CMA, Danielle    . PVD (peripheral vascular disease) 10/18/2012  . Carotid artery stenosis 04/22/2012  . Coronary artery disease   . Heart murmur   . Emphysema/COPD   . COPD GOLD III with min reversibilty  08/11/2006    Followed in Pulmonary clinic/ [facility]/ [place] - PFT's 04/28/2013  FEV1 0.88 (40%) with ratio 44 and 14% better p B2 dlco 45 corrects to 83 - Trial of breo 04/28/2013 > improved symptoms  06/09/2013  - spirometry 06/04/2014 FEV1  0.76 (29%) ratio 45      . Anemia   . Iron deficiency anemia 01/25/2009    Qualifier: Diagnosis of  By: Henrene Pastor MD, Docia Chuck   . History of blood transfusion "couple times"    "related to bleeding in colon and esophagus"  . Arthritis     "left shoulder" (10/19/2014)  . Anxiety   . 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    26 mm Edwards Sapien XT transcatheter heart valve placed via transapical approach     CURRENT MEDICATIONS: Reviewed  Patient's Medications  New Prescriptions   No medications on file  Previous Medications   AMIODARONE (PACERONE) 200 MG TABLET    Take 1 tablet (200 mg total) by mouth 2 (  two) times daily after a meal.   AMLODIPINE (NORVASC) 10 MG TABLET    Take 1 tablet (10 mg total) by mouth daily.   ASCORBIC ACID (VITAMIN C) 250 MG TABLET    Take 500 mg by mouth daily.    ASPIRIN 81 MG CHEWABLE TABLET    Chew 1 tablet (81 mg total) by mouth daily.   ATORVASTATIN (LIPITOR) 40 MG TABLET    TAKE ONE TABLET BY MOUTH ONCE DAILY   BREO ELLIPTA 100-25 MCG/INH AEPB    INHALE ONE PUFF INTO LUNGS ONCE DAILY IN THE MORNING   CLOPIDOGREL (PLAVIX) 75 MG TABLET    Take 1 tablet (75 mg total) by mouth daily with breakfast.   DOCUSATE SODIUM (COLACE) 100 MG CAPSULE    Take 100 mg by mouth 2 (two) times daily.    FENOFIBRATE 160 MG TABLET    Take 1 tablet (160  mg total) by mouth daily.   IPRATROPIUM-ALBUTEROL (COMBIVENT RESPIMAT) 20-100 MCG/ACT AERS RESPIMAT    Inhale 1 puff into the lungs 4 (four) times daily as needed for wheezing.   MELATONIN 3 MG TABS    Take 3 mg by mouth at bedtime.   METOPROLOL TARTRATE (LOPRESSOR) 25 MG TABLET    Take 0.5 tablets (12.5 mg total) by mouth 2 (two) times daily.   MULTIPLE VITAMINS-MINERALS (CENTRUM SILVER PO)    Take 1 tablet by mouth daily.    OXYCODONE (OXY IR/ROXICODONE) 5 MG IMMEDIATE RELEASE TABLET    Take 1-2 tablets (5-10 mg total) by mouth every 6 (six) hours as needed for severe pain.   PANTOPRAZOLE (PROTONIX) 40 MG TABLET    TAKE ONE TABLET BY MOUTH ONCE DAILY   VALSARTAN-HYDROCHLOROTHIAZIDE (DIOVAN HCT) 160-12.5 MG PER TABLET    Take 1 tablet by mouth daily.   VOLTAREN 1 % GEL    Apply 2 g topically 4 (four) times daily as needed (for arthritis).   Modified Medications   No medications on file  Discontinued Medications   No medications on file     No Known Allergies   REVIEW OF SYSTEMS:  GENERAL: no change in appetite, no fatigue, no weight changes, no fever, chills or weakness EYES: Denies change in vision, dry eyes, eye pain, itching or discharge EARS: Denies change in hearing, ringing in ears, or earache NOSE: Denies nasal congestion or epistaxis MOUTH and THROAT: Denies oral discomfort, gingival pain or bleeding, pain from teeth or hoarseness   RESPIRATORY: no cough, SOB, DOE, wheezing, hemoptysis CARDIAC: no chest pain or palpitations GI: no abdominal pain, diarrhea, constipation, heart burn, nausea or vomiting GU: Denies dysuria, frequency, hematuria, incontinence, or discharge PSYCHIATRIC: Denies feeling of depression or anxiety. No report of hallucinations, insomnia, paranoia, or agitation   PHYSICAL EXAMINATION  GENERAL APPEARANCE: Well nourished. In no acute distress. Normal body habitus SKIN:  Left lower chest and LUQ surgical incision, dry, no erythema HEAD: Normal in size  and contour. No evidence of trauma EYES: Lids open and close normally. No blepharitis, entropion or ectropion. PERRL. Conjunctivae are clear and sclerae are white. Lenses are without opacity EARS: Pinnae are normal. Patient hears normal voice tunes of the examiner MOUTH and THROAT: Lips are without lesions. Oral mucosa is moist and without lesions. Tongue is normal in shape, size, and color and without lesions NECK: supple, trachea midline, no neck masses, no thyroid tenderness, no thyromegaly LYMPHATICS: no LAN in the neck, no supraclavicular LAN RESPIRATORY: breathing is even & unlabored, BS CTAB CARDIAC: RRR, no murmur,no  extra heart sounds, BLE edema 1+ GI: abdomen soft, normal BS, no masses, no tenderness, no hepatomegaly, no splenomegaly EXTREMITIES:  Able to move 4 extremities PSYCHIATRIC: Alert and oriented X 3. Affect and behavior are appropriate  LABS/RADIOLOGY: Labs reviewed: Basic Metabolic Panel:  Recent Labs  11/20/14 1700  11/21/14 0457 11/21/14 1650  11/22/14 0419 11/23/14 0356 11/24/14 0342  NA  --   < > 133*  --   < > 133* 132* 133*  K  --   < > 3.6  --   < > 4.0 4.3 4.1  CL  --   < > 99*  --   < > 100* 99* 98*  CO2  --   --  24  --   --  26 26 28   GLUCOSE  --   < > 115*  --   < > 121* 131* 105*  BUN  --   < > 11  --   < > 12 11 16   CREATININE 0.91  < > 0.85 0.91  < > 0.80 0.78 0.73  CALCIUM  --   --  8.0*  --   --  8.0* 8.2* 8.3*  MG 2.8*  --  2.1 2.3  --   --   --   --   < > = values in this interval not displayed. Liver Function Tests:  Recent Labs  08/16/14 1147 11/05/14 1148 11/16/14 1059  AST 23 22 23   ALT 29 21 20   ALKPHOS 28* 32* 39  BILITOT 0.3 0.3 0.6  PROT 7.0 7.5 7.4  ALBUMIN 4.1 4.3 4.0    CBC:  Recent Labs  08/15/14 1602  10/15/14 0911  11/05/14 1148  11/22/14 0419 11/23/14 0356 11/24/14 0342  WBC 9.8  < > 9.2  < > 10.1  < > 14.0* 14.5* 8.9  NEUTROABS 6.9  --  6.4  --  7.4  --   --   --   --   HGB 9.8*  < > 15.4  < > 11.8*   < > 9.9* 10.5* 10.2*  HCT 29.3*  < > 46.2  < > 36.3*  < > 31.6* 32.5* 31.9*  MCV 104.6*  < > 98.9  < > 97.4  < > 98.4 97.3 98.2  PLT 389.0  < > 278.0  < > 336.0  < > 251 282 310  < > = values in this interval not displayed.  Lipid Panel:  Recent Labs  03/05/14 1634 05/10/14 1124 11/05/14 1148  HDL 44.80 55.40 36.20*   Cardiac Enzymes:  Recent Labs  06/01/14 0402 06/01/14 0929 06/01/14 1449  TROPONINI <0.03 <0.03 0.06*   CBG:  Recent Labs  11/22/14 0013 11/22/14 0411 11/22/14 0731  GLUCAP 120* 118* 131*    Dg Chest 2 View  11/23/2014   CLINICAL DATA:  Status post aortic valve replacement and plating at the left eighth rib on 11/20/2014.  EXAM: CHEST  2 VIEW  COMPARISON:  Single view of the chest 11/22/2014.  FINDINGS: Bibasilar atelectasis seen on yesterday's examination is improved. There small bilateral pleural effusions. No pneumothorax is identified. Heart size is mildly enlarged. Fixation of the left eighth rib with plate and screws is noted. Right IJ catheter is unchanged.  IMPRESSION: Improved bibasilar atelectasis.  Cardiomegaly without edema.   Electronically Signed   By: Inge Rise M.D.   On: 11/23/2014 07:25   Dg Chest 2 View  11/16/2014   CLINICAL DATA:  Aortic stenosis .  EXAM: CHEST  2 VIEW  COMPARISON:  CT 07/09/2014.  Chest x-ray 05/31/2014.  FINDINGS: Mediastinum and hilar structures are normal. Heart size is normal. Diffuse bilateral pulmonary interstitial prominence noted consistent with pneumonitis. Stable bilateral nodularity with calcified pleural plaques noted. Prior asbestos exposure could present in this fashion. Reference is made to prior CT report 07/09/2014 .  IMPRESSION: Chronic interstitial lung disease with calcified pleural plaques and nodularity consistent with prior asbestos exposure. No acute cardiopulmonary disease identified   Electronically Signed   By: Marcello Moores  Register   On: 11/16/2014 12:26   Dg Chest Port 1 View  11/22/2014    CLINICAL DATA:  COPD, CHF, post TAVR  EXAM: PORTABLE CHEST - 1 VIEW  COMPARISON:  Portable exam 0541 hours compared to 11/21/2014  FINDINGS: Pair of RIGHT jugular catheters project over SVC.  Borderline enlargement of cardiac silhouette.  Atherosclerotic calcification aorta.  Pulmonary vascular congestion.  Linear subsegmental atelectasis at at both lung bases.  Minimal pulmonary edema persists with a background of COPD changes.  No pleural effusion or gross pneumothorax, tip of RIGHT apex excluded.  Bones demineralized.  Post plating of a lower LEFT rib.  IMPRESSION: Increased bibasilar atelectasis.  COPD changes with superimposed CHF.   Electronically Signed   By: Lavonia Dana M.D.   On: 11/22/2014 07:49   Dg Chest Port 1 View  11/21/2014   CLINICAL DATA:  Congestive heart failure, valvular heart disease with transcatheter aortic valve replacement, COPD  EXAM: PORTABLE CHEST - 1 VIEW  COMPARISON:  Portable chest x-ray of November 20, 2014  FINDINGS: The lungs are well-expanded. The interstitial markings remain coarse but are less conspicuous. Stable nodularity in the right lower lung projecting over the posterior aspect of the eighth rib is stable. There is stable subsegmental atelectasis in the left mid lung. There is no significant pleural effusion. The cardiac silhouette remains enlarged. The pulmonary vascularity is mildly prominent centrally.  There has been interval extubation of the trachea and esophagus. The Swan-Ganz catheter has been removed. The right internal jugular Cordis sheath as well as adjacent venous catheter are stable in position. The left chest tube is stable in appearance lying just inferior to the posterior aspect of the fifth rib.  IMPRESSION: Interval removal of the endotracheal and esophagogastric tubes as well Swan-Ganz catheter. Otherwise stable low-grade CHF superimposed upon COPD.   Electronically Signed   By: David  Martinique M.D.   On: 11/21/2014 07:14   Dg Chest Port 1  View  11/20/2014   CLINICAL DATA:  CHF.  Postop TAVR  EXAM: PORTABLE CHEST - 1 VIEW  COMPARISON:  11/16/2014  FINDINGS: Endotracheal tube in good position . Swan-Ganz catheter in the main pulmonary artery. Right jugular central venous catheter tip in the SVC. Left chest tube in good position. NG tube in the stomach.  Aortic valve prosthesis in good position.  No pneumothorax.  Mild vascular congestion similar to the prior study. Mild interstitial edema.  Calcified pleural plaque.  IMPRESSION: Mild congestive heart failure with interstitial edema similar to the prior study.  Support lines in good position.  No pneumothorax.   Electronically Signed   By: Franchot Gallo M.D.   On: 11/20/2014 11:34    ASSESSMENT/PLAN:  Physical deconditioning - for rehabilitation  Severe aortic stenosis S/P transcatheter aortic valve replacement - follow-up with [facility] [place] on 12/10/14 and with Dr. Roxy Manns, cardiothoracic surgeon, on 12/24/14; continue oxycodone 5 mg 1-2 tabs by mouth every 6 hours when necessary for pain ; weigh  daily  CAD - continue Plavix 75 mg 1 tab by mouth daily and aspirin 81 mg 1 tab by mouth daily  Hypertension - well-controlled; continue Norvasc 10 mg 1 tab by mouth daily; check BMP  Hyperlipidemia - continue atorvastatin 40 mg 1 tab by mouth daily  COPD - continue Breo-Ellipta 100-25 mcg/ inh AEPB inhale 1 puff into Long's daily  Atrial fibrillation with RVR - rate controlled; continue amiodarone 200 mg 1 tab by mouth twice a day  History of GI bleed - continue Protonix 40 mg 1 tab by mouth daily  Arthritis - continue Voltaren gel 1% 2 g topically 4 times a day when necessary  Constipation - continue Colace 100 mg 1 capsule by mouth twice a day  Anemia, acute blood loss - hemoglobin 10.2; check CBC     Goals of care:  Short-term rehabilitation        [facility], NP [facility] [phone]

## 2014-11-29 NOTE — Telephone Encounter (Signed)
Completed paperwork faxed to [facility]

## 2014-11-30 ENCOUNTER — Non-Acute Institutional Stay (SKILLED_NURSING_FACILITY): Payer: Medicare Other | Admitting: Internal Medicine

## 2014-11-30 DIAGNOSIS — I4891 Unspecified atrial fibrillation: Secondary | ICD-10-CM

## 2014-11-30 DIAGNOSIS — I10 Essential (primary) hypertension: Secondary | ICD-10-CM

## 2014-11-30 DIAGNOSIS — I350 Nonrheumatic aortic (valve) stenosis: Secondary | ICD-10-CM

## 2014-11-30 DIAGNOSIS — D62 Acute posthemorrhagic anemia: Secondary | ICD-10-CM | POA: Diagnosis not present

## 2014-11-30 DIAGNOSIS — J449 Chronic obstructive pulmonary disease, unspecified: Secondary | ICD-10-CM

## 2014-11-30 DIAGNOSIS — K219 Gastro-esophageal reflux disease without esophagitis: Secondary | ICD-10-CM

## 2014-11-30 DIAGNOSIS — I25810 Atherosclerosis of coronary artery bypass graft(s) without angina pectoris: Secondary | ICD-10-CM

## 2014-11-30 DIAGNOSIS — K5900 Constipation, unspecified: Secondary | ICD-10-CM | POA: Diagnosis not present

## 2014-11-30 DIAGNOSIS — R5381 Other malaise: Secondary | ICD-10-CM | POA: Diagnosis not present

## 2014-11-30 DIAGNOSIS — J3489 Other specified disorders of nose and nasal sinuses: Secondary | ICD-10-CM

## 2014-11-30 NOTE — Progress Notes (Signed)
Patient ID: Ronald Bell, male   DOB: [birth year], 74 y.o.   MRN: [id number]       [facility]   PCP: [name], DO  Code Status: full code  No Known Allergies  Chief Complaint  Patient presents with  . New Admit To SNF     HPI:  73 y.o. patient is here for short term rehabilitation post hospital admission from 11/20/14-11/27/14 for transcatheter aortic valve replacement for aortic stenosis. He required diuresis post op for hypervolemia and amiodarone for afib with RVR. He is seen in his room today. He is on o2. He has dyspnea with exertion mainly. Denies any chest pain. Feels weak and tired. No other concerns. Has been working with therapy team.   Review of Systems:  Constitutional: Negative for fever, chills, diaphoresis.  HENT: Negative for headache, congestion, nasal discharge, hearing loss Eyes: Negative for eye pain, blurred vision, double vision and discharge.  Respiratory: positive for cough and dyspnea. On o2. Negative for wheezing.   Cardiovascular: Negative for chest pain, palpitations, leg swelling.  Gastrointestinal: Negative for heartburn, nausea, vomiting, abdominal pain. Had bowel movement yesterday Genitourinary: Negative for dysuria, flank pain.  Musculoskeletal: Negative for back pain, falls Skin: Negative for itching, rash.  Neurological: Negative for dizziness, tingling, focal weakness Psychiatric/Behavioral: Negative for depression    Past Medical History  Diagnosis Date  . Polyp of nasal cavity   . Rosacea   . Benign neoplasm of colon   . Tobacco abuse   . Psychosexual dysfunction with inhibited sexual excitement   . Hypertension   . Hyperlipidemia   . GERD (gastroesophageal reflux disease)   . GI bleed 2010    4 units PRBCs  . Adenomatous colon polyp   . Peripheral vascular disease   . Irregular heartbeat   . Shingles   . Carotid artery occlusion   . Angiodysplasia of intestine with hemorrhage   .  Pleural plaque with presence of asbestos 03/27/2013    Followed in Pulmonary clinic/ [facility]/ [place] - F/u CT 09/08/2013 1. Stable extensive calcified pleural plaque formation consistent with asbestos exposure. 2. Multiple pulmonary nodules are unchanged from the CT of 6 months ago. Given risk factors for lung cancer, continued follow up is recommended with chest CT in 6 months> done 04/20/14 no change >repeat in 12 m in tickle file     . GERD 11/30/2008    Qualifier: Diagnosis of  By: Marijean Niemann CMA, Danielle    . PVD (peripheral vascular disease) 10/18/2012  . Carotid artery stenosis 04/22/2012  . Coronary artery disease   . Heart murmur   . Emphysema/COPD   . COPD GOLD III with min reversibilty  08/11/2006    Followed in Pulmonary clinic[facility] [place] - PFT's 04/28/2013  FEV1 0.88 (40%) with ratio 44 and 14% better p B2 dlco 45 corrects to 83 - Trial of breo 04/28/2013 > improved symptoms  06/09/2013  - spirometry 06/04/2014 FEV1  0.76 (29%) ratio 45      . Anemia   . Iron deficiency anemia 01/25/2009    Qualifier: Diagnosis of  By: Henrene Pastor MD, Docia Chuck   . History of blood transfusion "couple times"    "related to bleeding in colon and esophagus"  . Arthritis     "left shoulder" (10/19/2014)  . Anxiety   . 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 Iliac artery stent Left 2005    CIA  . Esophagogastroduodenoscopy  2012    normal  . Colonoscopy  July 2015    Dr. Henrene Pastor  . Tonsillectomy    . Knee arthroscopy with medial menisectomy Left 03/08/2014    Procedure: LEFT KNEE SCOPE WITH MEDIAL MENISECTOMY AND CHONDROPLASTY;  Surgeon: Ninetta Lights, MD;  Location: [facility];  Service: Orthopedics;  Laterality: Left;  . Left and right heart catheterization with coronary angiogram N/A 06/28/2014    Procedure: LEFT AND RIGHT HEART  CATHETERIZATION WITH CORONARY ANGIOGRAM;  Surgeon: Jerline Pain, MD;  Location: [facility] CATH LAB;  Service: Cardiovascular;  Laterality: N/A;  . Esophagogastroduodenoscopy N/A 08/17/2014    Procedure: ESOPHAGOGASTRODUODENOSCOPY (EGD);  Surgeon: Jerene Bears, MD;  Location: [facility] ENDOSCOPY;  Service: Endoscopy;  Laterality: N/A;  . Colonoscopy N/A 08/17/2014    Procedure: COLONOSCOPY;  Surgeon: Jerene Bears, MD;  Location: [facility] ENDOSCOPY;  Service: Endoscopy;  Laterality: N/A;  . Cardiac catheterization  2001; 06/28/2014  . Cardiac catheterization N/A 10/19/2014    Procedure: Coronary Stent Intervention;  Surgeon: Burnell Blanks, MD;  Location: [place] CV LAB;  Service: Cardiovascular;  Laterality: N/A;  BMS Mid RCA  . Transcatheter aortic valve replacement, transapical N/A 11/20/2014    Procedure: TRANSCATHETER AORTIC VALVE REPLACEMENT, TRANSAPICAL;  Surgeon: [name], MD;  Location: [place];  Service: Open Heart Surgery;  Laterality: N/A;  . Tee without cardioversion N/A 11/20/2014    Procedure: TRANSESOPHAGEAL ECHOCARDIOGRAM (TEE);  Surgeon: [name], MD;  Location: [place];  Service: Open Heart Surgery;  Laterality: N/A;  . Rib plating Left 11/20/2014    Procedure: RIB PLATING OF LEFT 8TH RIB;  Surgeon: [name], MD;  Location: [place];  Service: Open Heart Surgery;  Laterality: Left;   Social History:   reports that he quit smoking about 7 months ago. His smoking use included Cigarettes and E-cigarettes. He has a 60 pack-year smoking history. He has never used smokeless tobacco. He reports that he drinks about 8.4 oz of alcohol per week. He reports that he does not use illicit drugs.  Family History  Problem Relation Age of Onset  . Lung disease Mother     pulm fibrosis  . Uterine cancer    . Colitis Father   . Colon cancer Neg Hx   . Heart disease Brother   . Hypertension Brother   . Hyperlipidemia Brother   . Heart disease Daughter     cad  . Hypertension Son      Medications:   Medication List       This list is accurate as of: 11/30/14 12:02 PM.  Always use your most recent med list.               amiodarone 200 MG tablet  Commonly known as:  PACERONE  Take 1 tablet (200 mg total) by mouth 2 (two) times daily after a meal.     amLODipine 10 MG tablet  Commonly known as:  NORVASC  Take 1 tablet (10 mg total) by mouth daily.     aspirin 81 MG chewable tablet  Chew 1 tablet (81 mg total) by mouth daily.     atorvastatin 40 MG tablet  Commonly known as:  LIPITOR  TAKE ONE TABLET BY MOUTH ONCE DAILY     BREO ELLIPTA 100-25 MCG/INH Aepb  Generic drug:  Fluticasone Furoate-Vilanterol  INHALE ONE PUFF  INTO LUNGS ONCE DAILY IN THE MORNING     CENTRUM SILVER PO  Take 1 tablet by mouth daily.     clopidogrel 75 MG tablet  Commonly known as:  PLAVIX  Take 1 tablet (75 mg total) by mouth daily with breakfast.     docusate sodium 100 MG capsule  Commonly known as:  COLACE  Take 100 mg by mouth 2 (two) times daily.     fenofibrate 160 MG tablet  Take 1 tablet (160 mg total) by mouth daily.     Ipratropium-Albuterol 20-100 MCG/ACT Aers respimat  Commonly known as:  COMBIVENT RESPIMAT  Inhale 1 puff into the lungs 4 (four) times daily as needed for wheezing.     Melatonin 3 MG Tabs  Take 3 mg by mouth at bedtime.     metoprolol tartrate 25 MG tablet  Commonly known as:  LOPRESSOR  Take 0.5 tablets (12.5 mg total) by mouth 2 (two) times daily.     oxyCODONE 5 MG immediate release tablet  Commonly known as:  Oxy IR/ROXICODONE  Take 1-2 tablets (5-10 mg total) by mouth every 6 (six) hours as needed for severe pain.     pantoprazole 40 MG tablet  Commonly known as:  PROTONIX  TAKE ONE TABLET BY MOUTH ONCE DAILY     valsartan-hydrochlorothiazide 160-12.5 MG per tablet  Commonly known as:  DIOVAN HCT  Take 1 tablet by mouth daily.     vitamin C 250 MG tablet  Commonly known as:  ASCORBIC ACID  Take 500 mg by mouth daily.      VOLTAREN 1 % Gel  Generic drug:  diclofenac sodium  Apply 2 g topically 4 (four) times daily as needed (for arthritis).         Physical Exam: Filed Vitals:   11/30/14 1201  BP: 109/78  Pulse: 78  Temp: 99 F (37.2 C)  Resp: 18  Weight: 145 lb 12.8 oz (66.134 kg)  SpO2: 94%    General- elderly male, well built, in no acute distress Head- normocephalic, atraumatic Nose- normal nasal mucosa, no maxillary or frontal sinus tenderness, no nasal discharge Throat- moist mucus membrane Eyes- PERRLA, EOMI, no pallor, no icterus, no discharge, normal conjunctiva, normal sclera Neck- no cervical lymphadenopathy, no JVD Cardiovascular- normal s1,s2, no murmurs, trace leg edema Respiratory- bilateral poor air entry, no wheeze, no rhonchi, no crackles, no use of accessory muscles, on o2 Abdomen- bowel sounds present, soft, non tender Musculoskeletal- able to move all 4 extremities, generalized weakness Neurological- no focal deficit, alert and oriented to person, place and time Skin- warm and dry, surgical incision in chest area healing well Psychiatry- normal mood and affect    Labs reviewed: Basic Metabolic Panel:  Recent Labs  11/20/14 1700  11/21/14 0457 11/21/14 1650  11/22/14 0419 11/23/14 0356 11/24/14 0342  NA  --   < > 133*  --   < > 133* 132* 133*  K  --   < > 3.6  --   < > 4.0 4.3 4.1  CL  --   < > 99*  --   < > 100* 99* 98*  CO2  --   --  24  --   --  26 26 28   GLUCOSE  --   < > 115*  --   < > 121* 131* 105*  BUN  --   < > 11  --   < > 12 11 16   CREATININE 0.91  < > 0.85  0.91  < > 0.80 0.78 0.73  CALCIUM  --   --  8.0*  --   --  8.0* 8.2* 8.3*  MG 2.8*  --  2.1 2.3  --   --   --   --   < > = values in this interval not displayed. Liver Function Tests:  Recent Labs  08/16/14 1147 11/05/14 1148 11/16/14 1059  AST 23 22 23   ALT 29 21 20   ALKPHOS 28* 32* 39  BILITOT 0.3 0.3 0.6  PROT 7.0 7.5 7.4  ALBUMIN 4.1 4.3 4.0   No results for input(s):  LIPASE, AMYLASE in the last 8760 hours. No results for input(s): AMMONIA in the last 8760 hours. CBC:  Recent Labs  08/15/14 1602  10/15/14 0911  11/05/14 1148  11/22/14 0419 11/23/14 0356 11/24/14 0342  WBC 9.8  < > 9.2  < > 10.1  < > 14.0* 14.5* 8.9  NEUTROABS 6.9  --  6.4  --  7.4  --   --   --   --   HGB 9.8*  < > 15.4  < > 11.8*  < > 9.9* 10.5* 10.2*  HCT 29.3*  < > 46.2  < > 36.3*  < > 31.6* 32.5* 31.9*  MCV 104.6*  < > 98.9  < > 97.4  < > 98.4 97.3 98.2  PLT 389.0  < > 278.0  < > 336.0  < > 251 282 310  < > = values in this interval not displayed. Cardiac Enzymes:  Recent Labs  06/01/14 0402 06/01/14 0929 06/01/14 1449  TROPONINI <0.03 <0.03 0.06*   BNP: Invalid input(s): POCBNP CBG:  Recent Labs  11/22/14 0013 11/22/14 0411 11/22/14 0731  GLUCAP 120* 118* 131*    Radiological Exams: Dg Chest 2 View  11/16/2014   CLINICAL DATA:  Aortic stenosis .  EXAM: CHEST  2 VIEW  COMPARISON:  CT 07/09/2014.  Chest x-ray 05/31/2014.  FINDINGS: Mediastinum and hilar structures are normal. Heart size is normal. Diffuse bilateral pulmonary interstitial prominence noted consistent with pneumonitis. Stable bilateral nodularity with calcified pleural plaques noted. Prior asbestos exposure could present in this fashion. Reference is made to prior CT report 07/09/2014 .  IMPRESSION: Chronic interstitial lung disease with calcified pleural plaques and nodularity consistent with prior asbestos exposure. No acute cardiopulmonary disease identified   Electronically Signed   By: Marcello Moores  Register   On: 11/16/2014 12:26    Assessment/Plan  Physical deconditioning Will have him work with physical therapy and occupational therapy team to help with gait training and muscle strengthening exercises.fall precautions. Skin care. Encourage to be out of bed.   Severe aortic stenosis  S/P transcatheter aortic valve replacement. Surgical incision healing well. Will need to work with therapy to  help improve endurance. Encouraged to use incentive spirometer for pulmonary toileting. Has f/u with [facility] on 12/10/14. Continue oxycodone 5 mg 1-2 tab q6h prn chest soreness  Blood loss anemia Post op, monitor h&h, continue ferrous sulfate 325 mg bid for now  afib Rate controlled. Continue metoprolol 12.5 mg bid and amiodarone 200 mg bid for now  Insomnia Continue melatonin 3 mg qhs and monitor  Hypertension Stable with some soft bp reading. currently on metoprolol 12.5 mg bid, norvasc 10 mg daily and valsartan-hctz 160-12.5 mg daily. Check bp q shift x 1 week and adjsut medication if needed  COPD  continue oxygen, Breo-Ellipta and combivent and monitor  CAD Remains chest pain free. continue plavix 75 mg daily, metoprolol,  valsartan, aspirin 81 mg daily, atorvastatin 40 mg daily  gerd With hx of gi bleed. Symptoms controlled. continue Protonix 40 mg daily  Constipation  continue Colace 100 mg bid  Dry nose With him using o2, start nasal saline spray to each nostril bid and monitor   Goals of care: short term rehabilitation   Labs/tests ordered: cbc, bmp  Family/ staff Communication: reviewed care plan with patient and nursing supervisor    Blanchie Serve, MD  [facility] [phone] (Monday-Friday 8 am - 5 pm) [phone] (afterhours)

## 2014-12-03 ENCOUNTER — Emergency Department (HOSPITAL_COMMUNITY): Payer: Medicare Other

## 2014-12-03 ENCOUNTER — Inpatient Hospital Stay (HOSPITAL_COMMUNITY)
Admission: EM | Admit: 2014-12-03 | Discharge: 2014-12-09 | DRG: 344 | Disposition: A | Discharge status: Home / Self Care | Payer: Medicare Other | Attending: Internal Medicine | Admitting: Internal Medicine

## 2014-12-03 ENCOUNTER — Encounter (HOSPITAL_COMMUNITY): Payer: Self-pay | Admitting: Emergency Medicine

## 2014-12-03 DIAGNOSIS — Z7902 Long term (current) use of antithrombotics/antiplatelets: Secondary | ICD-10-CM

## 2014-12-03 DIAGNOSIS — Z87891 Personal history of nicotine dependence: Secondary | ICD-10-CM | POA: Diagnosis not present

## 2014-12-03 DIAGNOSIS — Z955 Presence of coronary angioplasty implant and graft: Secondary | ICD-10-CM

## 2014-12-03 DIAGNOSIS — I2510 Atherosclerotic heart disease of native coronary artery without angina pectoris: Secondary | ICD-10-CM | POA: Diagnosis present

## 2014-12-03 DIAGNOSIS — K5521 Angiodysplasia of colon with hemorrhage: Secondary | ICD-10-CM | POA: Diagnosis not present

## 2014-12-03 DIAGNOSIS — Z8249 Family history of ischemic heart disease and other diseases of the circulatory system: Secondary | ICD-10-CM | POA: Diagnosis not present

## 2014-12-03 DIAGNOSIS — E861 Hypovolemia: Secondary | ICD-10-CM | POA: Diagnosis present

## 2014-12-03 DIAGNOSIS — J90 Pleural effusion, not elsewhere classified: Secondary | ICD-10-CM | POA: Diagnosis present

## 2014-12-03 DIAGNOSIS — I4891 Unspecified atrial fibrillation: Secondary | ICD-10-CM | POA: Diagnosis present

## 2014-12-03 DIAGNOSIS — J9620 Acute and chronic respiratory failure, unspecified whether with hypoxia or hypercapnia: Secondary | ICD-10-CM | POA: Diagnosis present

## 2014-12-03 DIAGNOSIS — I517 Cardiomegaly: Secondary | ICD-10-CM | POA: Diagnosis present

## 2014-12-03 DIAGNOSIS — M1990 Unspecified osteoarthritis, unspecified site: Secondary | ICD-10-CM | POA: Diagnosis present

## 2014-12-03 DIAGNOSIS — E876 Hypokalemia: Secondary | ICD-10-CM | POA: Diagnosis not present

## 2014-12-03 DIAGNOSIS — Z9889 Other specified postprocedural states: Secondary | ICD-10-CM

## 2014-12-03 DIAGNOSIS — K254 Chronic or unspecified gastric ulcer with hemorrhage: Secondary | ICD-10-CM | POA: Diagnosis not present

## 2014-12-03 DIAGNOSIS — J438 Other emphysema: Secondary | ICD-10-CM | POA: Diagnosis present

## 2014-12-03 DIAGNOSIS — D125 Benign neoplasm of sigmoid colon: Secondary | ICD-10-CM | POA: Diagnosis not present

## 2014-12-03 DIAGNOSIS — J9601 Acute respiratory failure with hypoxia: Secondary | ICD-10-CM | POA: Diagnosis present

## 2014-12-03 DIAGNOSIS — I509 Heart failure, unspecified: Secondary | ICD-10-CM | POA: Diagnosis present

## 2014-12-03 DIAGNOSIS — I739 Peripheral vascular disease, unspecified: Secondary | ICD-10-CM | POA: Diagnosis present

## 2014-12-03 DIAGNOSIS — I499 Cardiac arrhythmia, unspecified: Secondary | ICD-10-CM | POA: Diagnosis present

## 2014-12-03 DIAGNOSIS — I6529 Occlusion and stenosis of unspecified carotid artery: Secondary | ICD-10-CM | POA: Diagnosis present

## 2014-12-03 DIAGNOSIS — K921 Melena: Secondary | ICD-10-CM

## 2014-12-03 DIAGNOSIS — R262 Difficulty in walking, not elsewhere classified: Secondary | ICD-10-CM | POA: Diagnosis not present

## 2014-12-03 DIAGNOSIS — I959 Hypotension, unspecified: Secondary | ICD-10-CM | POA: Diagnosis present

## 2014-12-03 DIAGNOSIS — K558 Other vascular disorders of intestine: Secondary | ICD-10-CM | POA: Diagnosis not present

## 2014-12-03 DIAGNOSIS — Z954 Presence of other heart-valve replacement: Secondary | ICD-10-CM | POA: Diagnosis not present

## 2014-12-03 DIAGNOSIS — Z7982 Long term (current) use of aspirin: Secondary | ICD-10-CM

## 2014-12-03 DIAGNOSIS — K5520 Angiodysplasia of colon without hemorrhage: Secondary | ICD-10-CM | POA: Diagnosis present

## 2014-12-03 DIAGNOSIS — I10 Essential (primary) hypertension: Secondary | ICD-10-CM | POA: Diagnosis present

## 2014-12-03 DIAGNOSIS — Z860100 Personal history of colon polyps, unspecified: Secondary | ICD-10-CM | POA: Diagnosis present

## 2014-12-03 DIAGNOSIS — E785 Hyperlipidemia, unspecified: Secondary | ICD-10-CM | POA: Diagnosis present

## 2014-12-03 DIAGNOSIS — D62 Acute posthemorrhagic anemia: Secondary | ICD-10-CM | POA: Diagnosis present

## 2014-12-03 DIAGNOSIS — J948 Other specified pleural conditions: Secondary | ICD-10-CM | POA: Diagnosis not present

## 2014-12-03 DIAGNOSIS — Z79899 Other long term (current) drug therapy: Secondary | ICD-10-CM | POA: Diagnosis not present

## 2014-12-03 DIAGNOSIS — Z952 Presence of prosthetic heart valve: Secondary | ICD-10-CM | POA: Diagnosis present

## 2014-12-03 DIAGNOSIS — D500 Iron deficiency anemia secondary to blood loss (chronic): Secondary | ICD-10-CM | POA: Diagnosis not present

## 2014-12-03 DIAGNOSIS — R0602 Shortness of breath: Secondary | ICD-10-CM | POA: Diagnosis not present

## 2014-12-03 DIAGNOSIS — Z5189 Encounter for other specified aftercare: Secondary | ICD-10-CM | POA: Diagnosis not present

## 2014-12-03 DIAGNOSIS — J449 Chronic obstructive pulmonary disease, unspecified: Secondary | ICD-10-CM | POA: Diagnosis present

## 2014-12-03 DIAGNOSIS — K219 Gastro-esophageal reflux disease without esophagitis: Secondary | ICD-10-CM | POA: Diagnosis present

## 2014-12-03 DIAGNOSIS — R0600 Dyspnea, unspecified: Secondary | ICD-10-CM

## 2014-12-03 DIAGNOSIS — Z77090 Contact with and (suspected) exposure to asbestos: Secondary | ICD-10-CM | POA: Diagnosis present

## 2014-12-03 DIAGNOSIS — D649 Anemia, unspecified: Secondary | ICD-10-CM | POA: Diagnosis not present

## 2014-12-03 DIAGNOSIS — F419 Anxiety disorder, unspecified: Secondary | ICD-10-CM | POA: Diagnosis present

## 2014-12-03 DIAGNOSIS — K922 Gastrointestinal hemorrhage, unspecified: Secondary | ICD-10-CM | POA: Diagnosis not present

## 2014-12-03 DIAGNOSIS — M6281 Muscle weakness (generalized): Secondary | ICD-10-CM | POA: Diagnosis not present

## 2014-12-03 HISTORY — DX: Personal history of adenomatous and serrated colon polyps: Z86.0101

## 2014-12-03 HISTORY — DX: Personal history of colonic polyps: Z86.010

## 2014-12-03 LAB — COMPREHENSIVE METABOLIC PANEL
ALK PHOS: 49 U/L (ref 38–126)
ALT: 26 U/L (ref 17–63)
AST: 28 U/L (ref 15–41)
Albumin: 2.6 g/dL — ABNORMAL LOW (ref 3.5–5.0)
Anion gap: 7 (ref 5–15)
BILIRUBIN TOTAL: 0.3 mg/dL (ref 0.3–1.2)
BUN: 17 mg/dL (ref 6–20)
CALCIUM: 8.7 mg/dL — AB (ref 8.9–10.3)
CO2: 27 mmol/L (ref 22–32)
CREATININE: 0.78 mg/dL (ref 0.61–1.24)
Chloride: 97 mmol/L — ABNORMAL LOW (ref 101–111)
Glucose, Bld: 118 mg/dL — ABNORMAL HIGH (ref 65–99)
Potassium: 4.8 mmol/L (ref 3.5–5.1)
SODIUM: 131 mmol/L — AB (ref 135–145)
Total Protein: 5.4 g/dL — ABNORMAL LOW (ref 6.5–8.1)

## 2014-12-03 LAB — CBC WITH DIFFERENTIAL/PLATELET
Basophils Absolute: 0.1 10*3/uL (ref 0.0–0.1)
Basophils Relative: 0 % (ref 0–1)
EOS ABS: 0.3 10*3/uL (ref 0.0–0.7)
EOS PCT: 2 % (ref 0–5)
HCT: 16.1 % — ABNORMAL LOW (ref 39.0–52.0)
Hemoglobin: 5.1 g/dL — CL (ref 13.0–17.0)
LYMPHS ABS: 1 10*3/uL (ref 0.7–4.0)
LYMPHS PCT: 6 % — AB (ref 12–46)
MCH: 29.8 pg (ref 26.0–34.0)
MCHC: 31.7 g/dL (ref 30.0–36.0)
MCV: 94.2 fL (ref 78.0–100.0)
MONO ABS: 1.9 10*3/uL — AB (ref 0.1–1.0)
Monocytes Relative: 12 % (ref 3–12)
Neutro Abs: 12.7 10*3/uL — ABNORMAL HIGH (ref 1.7–7.7)
Neutrophils Relative %: 80 % — ABNORMAL HIGH (ref 43–77)
PLATELETS: 733 10*3/uL — AB (ref 150–400)
RBC: 1.71 MIL/uL — AB (ref 4.22–5.81)
RDW: 16 % — AB (ref 11.5–15.5)
WBC: 15.9 10*3/uL — AB (ref 4.0–10.5)

## 2014-12-03 LAB — I-STAT TROPONIN, ED: Troponin i, poc: 0.06 ng/mL (ref 0.00–0.08)

## 2014-12-03 LAB — PROTIME-INR
INR: 1.27 (ref 0.00–1.49)
Prothrombin Time: 16 seconds — ABNORMAL HIGH (ref 11.6–15.2)

## 2014-12-03 LAB — POC OCCULT BLOOD, ED: FECAL OCCULT BLD: POSITIVE — AB

## 2014-12-03 LAB — HEMATOCRIT: HEMATOCRIT: 22 % — AB (ref 39.0–52.0)

## 2014-12-03 LAB — APTT: APTT: 29 s (ref 24–37)

## 2014-12-03 LAB — HEMOGLOBIN: HEMOGLOBIN: 7.3 g/dL — AB (ref 13.0–17.0)

## 2014-12-03 LAB — PREPARE RBC (CROSSMATCH)

## 2014-12-03 LAB — I-STAT CG4 LACTIC ACID, ED: Lactic Acid, Venous: 1.13 mmol/L (ref 0.5–2.0)

## 2014-12-03 LAB — BRAIN NATRIURETIC PEPTIDE: B Natriuretic Peptide: 59.7 pg/mL (ref 0.0–100.0)

## 2014-12-03 MED ORDER — IPRATROPIUM-ALBUTEROL 0.5-2.5 (3) MG/3ML IN SOLN
3.0000 mL | Freq: Four times a day (QID) | RESPIRATORY_TRACT | Status: DC | PRN
  Administered 2014-12-04 – 2014-12-07 (×10): 3 mL via RESPIRATORY_TRACT
  Filled 2014-12-03 (×10): qty 3

## 2014-12-03 MED ORDER — ACETAMINOPHEN 325 MG PO TABS
650.0000 mg | ORAL_TABLET | Freq: Four times a day (QID) | ORAL | Status: DC | PRN
  Administered 2014-12-06: 650 mg via ORAL
  Filled 2014-12-03: qty 2

## 2014-12-03 MED ORDER — PANTOPRAZOLE SODIUM 40 MG IV SOLR
40.0000 mg | Freq: Once | INTRAVENOUS | Status: AC
  Administered 2014-12-03: 40 mg via INTRAVENOUS
  Filled 2014-12-03: qty 40

## 2014-12-03 MED ORDER — ACETAMINOPHEN 650 MG RE SUPP: 650.0000 mg | Freq: Four times a day (QID) | RECTAL | Status: DC | PRN

## 2014-12-03 MED ORDER — IPRATROPIUM-ALBUTEROL 20-100 MCG/ACT IN AERS: 1.0000 | INHALATION_SPRAY | Freq: Four times a day (QID) | RESPIRATORY_TRACT | Status: DC | PRN

## 2014-12-03 MED ORDER — METOPROLOL TARTRATE 12.5 MG HALF TABLET
12.5000 mg | ORAL_TABLET | Freq: Two times a day (BID) | ORAL | Status: DC
  Administered 2014-12-04 – 2014-12-09 (×11): 12.5 mg via ORAL
  Filled 2014-12-03 (×12): qty 1

## 2014-12-03 MED ORDER — SODIUM CHLORIDE 0.9 % IV SOLN
80.0000 mg | Freq: Once | INTRAVENOUS | Status: AC
  Administered 2014-12-03: 80 mg via INTRAVENOUS
  Filled 2014-12-03: qty 80

## 2014-12-03 MED ORDER — AMIODARONE HCL 200 MG PO TABS
200.0000 mg | ORAL_TABLET | Freq: Two times a day (BID) | ORAL | Status: DC
  Administered 2014-12-04 – 2014-12-09 (×11): 200 mg via ORAL
  Filled 2014-12-03 (×11): qty 1

## 2014-12-03 MED ORDER — SODIUM CHLORIDE 0.9 % IV SOLN
10.0000 mL/h | Freq: Once | INTRAVENOUS | Status: DC
Start: 2014-12-03 — End: 2014-12-03

## 2014-12-03 MED ORDER — SODIUM CHLORIDE 0.9 % IJ SOLN
3.0000 mL | Freq: Two times a day (BID) | INTRAMUSCULAR | Status: DC
  Administered 2014-12-03 – 2014-12-04 (×2): 3 mL via INTRAVENOUS
  Administered 2014-12-04: 10 mL via INTRAVENOUS

## 2014-12-03 MED ORDER — SODIUM CHLORIDE 0.9 % IV SOLN
8.0000 mg/h | INTRAVENOUS | Status: DC
  Administered 2014-12-03 – 2014-12-04 (×2): 8 mg/h via INTRAVENOUS
  Filled 2014-12-03 (×4): qty 80

## 2014-12-03 NOTE — H&P (Signed)
[facility] History and Physical  DESMIN DALEO JJH:[id number] DOB: 03/30/1940 DOA: 12/03/2014  Referring physician: er PCP: [name], DO   Chief Complaint: sent from [facility] for bloody stools  HPI: Ronald Bell is a 74 y.o. male with PMHX of severe AS, and angiodysplasia of the colon Who recently (July) had a stent placed, he then had TAVR.  He was discharged on 9/6 to [facility].  He was on ASA and plavix.  He then 4 days ago developed dark tarry stools.  No N/V.  No abdominal pain.  Has had GI bleeding in the past that required ligation.  He is more SOB since surgery as well.  He was doing well otherwise in PT and was due to be released soon from [place] (Per patient)  In the ER, his Hgb was found to be 5.1.  He was started transfusions of 2 units.  GI was consulted and will see in AM- [place], and he was started on protonix gtt. hospitalist were asked to admit    Review of Systems:  All systems reviewed, negative unless stated above  Past Medical History  Diagnosis Date  . Polyp of nasal cavity   . Rosacea   . Benign neoplasm of colon   . Tobacco abuse   . Psychosexual dysfunction with inhibited sexual excitement   . Hypertension   . Hyperlipidemia   . GERD (gastroesophageal reflux disease)   . GI bleed 2010    4 units PRBCs  . Adenomatous colon polyp   . Peripheral vascular disease   . Irregular heartbeat   . Shingles   . Carotid artery occlusion   . Angiodysplasia of intestine with hemorrhage   . Pleural plaque with presence of asbestos 03/27/2013    Followed in Pulmonary clinic/ [facility]/ [place] - F/u CT 09/08/2013 1. Stable extensive calcified pleural plaque formation consistent with asbestos exposure. 2. Multiple pulmonary nodules are unchanged from the CT of 6 months ago. Given risk factors for lung cancer, continued follow up is recommended with chest CT in 6 months> done 04/20/14 no change >repeat in 12 m in tickle file     . GERD  11/30/2008    Qualifier: Diagnosis of  By: Marijean Niemann CMA, Danielle    . PVD (peripheral vascular disease) 10/18/2012  . Carotid artery stenosis 04/22/2012  . Coronary artery disease   . Heart murmur   . Emphysema/COPD   . COPD GOLD III with min reversibilty  08/11/2006    Followed [facility] Pulmonary clinic[facility] [place] - PFT's 04/28/2013  FEV1 0.88 (40%) with ratio 44 and 14% better p B2 dlco 45 corrects to 83 - Trial of breo 04/28/2013 > improved symptoms  06/09/2013  - spirometry 06/04/2014 FEV1  0.76 (29%) ratio 45      . Anemia   . Iron deficiency anemia 01/25/2009    Qualifier: Diagnosis of  By: Henrene Pastor MD, Docia Chuck   . History of blood transfusion "couple times"    "related to bleeding in colon and esophagus"  . Arthritis     "left shoulder" (10/19/2014)  . Anxiety   . 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 Iliac artery stent Left 2005    CIA  . Esophagogastroduodenoscopy  2012    normal  . Colonoscopy  July 2015    Dr. Henrene Pastor  .  Tonsillectomy    . Knee arthroscopy with medial menisectomy Left 03/08/2014    Procedure: LEFT KNEE SCOPE WITH MEDIAL MENISECTOMY AND CHONDROPLASTY;  Surgeon: Ninetta Lights, MD;  Location: [place];  Service: Orthopedics;  Laterality: Left;  . Left and right heart catheterization with coronary angiogram N/A 06/28/2014    Procedure: LEFT AND RIGHT HEART CATHETERIZATION WITH CORONARY ANGIOGRAM;  Surgeon: Jerline Pain, MD;  Location: [facility] CATH LAB;  Service: Cardiovascular;  Laterality: N/A;  . Esophagogastroduodenoscopy N/A 08/17/2014    Procedure: ESOPHAGOGASTRODUODENOSCOPY (EGD);  Surgeon: Jerene Bears, MD;  Location: [facility] ENDOSCOPY;  Service: Endoscopy;  Laterality: N/A;  . Colonoscopy N/A 08/17/2014    Procedure: COLONOSCOPY;  Surgeon: Jerene Bears, MD;  Location: [facility] ENDOSCOPY;  Service: Endoscopy;  Laterality:  N/A;  . Cardiac catheterization  2001; 06/28/2014  . Cardiac catheterization N/A 10/19/2014    Procedure: Coronary Stent Intervention;  Surgeon: Burnell Blanks, MD;  Location: [place] CV LAB;  Service: Cardiovascular;  Laterality: N/A;  BMS Mid RCA  . Transcatheter aortic valve replacement, transapical N/A 11/20/2014    Procedure: TRANSCATHETER AORTIC VALVE REPLACEMENT, TRANSAPICAL;  Surgeon: [name], MD;  Location: [place];  Service: Open Heart Surgery;  Laterality: N/A;  . Tee without cardioversion N/A 11/20/2014    Procedure: TRANSESOPHAGEAL ECHOCARDIOGRAM (TEE);  Surgeon: [name], MD;  Location: [place];  Service: Open Heart Surgery;  Laterality: N/A;  . Rib plating Left 11/20/2014    Procedure: RIB PLATING OF LEFT 8TH RIB;  Surgeon: [name], MD;  Location: [place];  Service: Open Heart Surgery;  Laterality: Left;   Social History:  reports that he quit smoking about 7 months ago. His smoking use included Cigarettes and E-cigarettes. He has a 60 pack-year smoking history. He has never used smokeless tobacco. He reports that he drinks about 8.4 oz of alcohol per week. He reports that he does not use illicit drugs.  No Known Allergies  Family History  Problem Relation Age of Onset  . Lung disease Mother     pulm fibrosis  . Uterine cancer    . Colitis Father   . Colon cancer Neg Hx   . Heart disease Brother   . Hypertension Brother   . Hyperlipidemia Brother   . Heart disease Daughter     cad  . Hypertension Son     Prior to Admission medications   Medication Sig Start Date End Date Taking? Authorizing Provider  amiodarone (PACERONE) 200 MG tablet Take 1 tablet (200 mg total) by mouth 2 (two) times daily after a meal. 11/27/14  Yes Wayne E Gold, PA-C  amLODipine (NORVASC) 10 MG tablet Take 1 tablet (10 mg total) by mouth daily. 10/03/14  Yes Rosalita Chessman, DO  Ascorbic Acid (VITAMIN C) 250 MG tablet Take 500 mg by mouth daily.    Yes Historical Provider, MD    aspirin 81 MG chewable tablet Chew 1 tablet (81 mg total) by mouth daily. 10/20/14  Yes Brett Canales, PA-C  atorvastatin (LIPITOR) 40 MG tablet TAKE ONE TABLET BY MOUTH ONCE DAILY Patient taking differently: TAKE 40 MG BY MOUTH ONCE DAILY 10/17/14  Yes Yvonne R Lowne, DO  BREO ELLIPTA 100-25 MCG/INH AEPB INHALE ONE PUFF INTO LUNGS ONCE DAILY IN THE MORNING 09/26/14  Yes Tanda Rockers, MD  clopidogrel (PLAVIX) 75 MG tablet Take 1 tablet (75 mg total) by mouth daily with breakfast. 10/20/14  Yes Brett Canales, PA-C  docusate sodium (  COLACE) 100 MG capsule Take 100 mg by mouth 2 (two) times daily.    Yes Historical Provider, MD  fenofibrate 160 MG tablet Take 1 tablet (160 mg total) by mouth daily. 11/13/14  Yes Yvonne R Lowne, DO  Ipratropium-Albuterol (COMBIVENT RESPIMAT) 20-100 MCG/ACT AERS respimat Inhale 1 puff into the lungs 4 (four) times daily as needed for wheezing. 05/10/14  Yes Alferd Apa Lowne, DO  metoprolol tartrate (LOPRESSOR) 25 MG tablet Take 0.5 tablets (12.5 mg total) by mouth 2 (two) times daily. 08/18/14  Yes Charlynne Cousins, MD  Multiple Vitamins-Minerals (CENTRUM SILVER PO) Take 1 tablet by mouth daily.    Yes Historical Provider, MD  oxyCODONE (OXY IR/ROXICODONE) 5 MG immediate release tablet Take 1-2 tablets (5-10 mg total) by mouth every 6 (six) hours as needed for severe pain. 11/27/14  Yes Wayne E Gold, PA-C  pantoprazole (PROTONIX) 40 MG tablet TAKE ONE TABLET BY MOUTH ONCE DAILY Patient taking differently: Take 40 mg by mouth daily.  09/04/14  Yes Yvonne R Lowne, DO  valsartan-hydrochlorothiazide (DIOVAN HCT) 160-12.5 MG per tablet Take 1 tablet by mouth daily. 09/04/14  Yes Rosalita Chessman, DO   Physical Exam: Filed Vitals:   12/03/14 1915 12/03/14 2004 12/03/14 2048 12/03/14 2107  BP: 113/40 115/30 107/40 101/49  Pulse: 81 95 86 80  Temp:   98.1 F (36.7 C) 98.1 F (36.7 C)  TempSrc:   Oral Oral  Resp: 24 24 22 18   Height:      Weight:      SpO2: 100% 98% 99% 100%     Wt Readings from Last 3 Encounters:  12/03/14 65.772 kg (145 lb)  11/30/14 66.134 kg (145 lb 12.8 oz)  11/28/14 66.089 kg (145 lb 11.2 oz)    General:  Appears calm and comfortable- 3 L O2 Eyes: PERRL, normal lids, irises & conjunctiva ENT: grossly normal hearing, lips & tongue Neck: no LAD, masses or thyromegaly Cardiovascular: RRR, no m/r/g. No LE edema. Telemetry: SR, no arrhythmias  Respiratory: diminished b/l Abdomen: soft, ntnd Skin: no rash or induration seen on limited exam Musculoskeletal: grossly normal tone BUE/BLE Psychiatric: grossly normal mood and affect, speech fluent and appropriate Neurologic: grossly non-focal.          Labs on Admission:  Basic Metabolic Panel:  Recent Labs Lab 12/03/14 2044  NA 131*  K 4.8  CL 97*  CO2 27  GLUCOSE 118*  BUN 17  CREATININE 0.78  CALCIUM 8.7*   Liver Function Tests:  Recent Labs Lab 12/03/14 2044  AST 28  ALT 26  ALKPHOS 49  BILITOT 0.3  PROT 5.4*  ALBUMIN 2.6*   No results for input(s): LIPASE, AMYLASE in the last 168 hours. No results for input(s): AMMONIA in the last 168 hours. CBC:  Recent Labs Lab 12/03/14 1918  WBC 15.9*  NEUTROABS 12.7*  HGB 5.1*  HCT 16.1*  MCV 94.2  PLT 733*   Cardiac Enzymes: No results for input(s): CKTOTAL, CKMB, CKMBINDEX, TROPONINI in the last 168 hours.  BNP (last 3 results)  Recent Labs  05/31/14 2351 12/03/14 1919  BNP 75.7 59.7    ProBNP (last 3 results) No results for input(s): PROBNP in the last 8760 hours.  CBG: No results for input(s): GLUCAP in the last 168 hours.  Radiological Exams on Admission: Dg Chest 2 View  12/03/2014   CLINICAL DATA:  Dark stools onset 4 days ago. Shortness of breath and hypoxia. Aortic valve replacement 11/20/2014.  EXAM: CHEST  2  VIEW  COMPARISON:  11/23/2014, 05/31/2014 and chest CT 07/09/2014  FINDINGS: Lungs are adequately inflated and demonstrate worsening moderate size left pleural effusion likely with  associated atelectasis in the left base. Cannot completely exclude infection in the left base. Evidence of known bilateral calcified pleural plaques. Cardiomediastinal silhouette is unchanged. There is calcified plaque over the thoracoabdominal aorta. Prosthetic aortic valve unchanged. Remainder of the exam is unchanged.  IMPRESSION: Worsening moderate size left pleural effusion likely with associated atelectasis. Cannot exclude infection in the left base.  Stable asbestos related pleural disease.   Electronically Signed   By: Marin Olp M.D.   On: 12/03/2014 19:55     Assessment/Plan Active Problems:   Hemorrhage of gastrointestinal tract   Angiodysplasia of colon   Acute blood loss anemia   S/P TAVR (transcatheter aortic valve replacement)   CAD (coronary artery disease)  ABLA due to GI bleed with angiodysplasia of the colon -EGD and colonoscopy performed on 08/17/2014. Showed multiple angiodysplastic lesions. -GI to see in AM -getting 2 units of PRBC-- recheck and transfuse to 10 as h/o recent stent -protonix gtt  CAD with Recent stent 09/2014: Severe mid RCA stenosis now s/p successful rotablator atherectomy of the mid RCA with bare metal stent placement.  Recommendations: Will continue ASA and Plavix for one month. -consult cards in the AM  S/p TAVR -d/c'd 9/6 to [facility] -would notifiy CVTS in AM of admission  Acute on chronic resp failure -continue O2 -h/o COPD- no active wheezing  Hypotension -watch in SDU   [facility] To see in AM -will need to notify cardiology and CVTS in AM that patient has been admitted   Code Status: full DVT Prophylaxis: Family Communication: patient Disposition Plan: admit to SDU  Time spent: 75 min  Eulogio Bear [facility] Pager [phone]

## 2014-12-03 NOTE — ED Notes (Signed)
Lab called with critical value of HGB 5.1 notified EDP.

## 2014-12-03 NOTE — ED Notes (Signed)
ED Doctor at bedside.  

## 2014-12-03 NOTE — ED Notes (Signed)
Patient currently at nursing home and sent to ED for evaluation of HGB and dark tarry stool.

## 2014-12-03 NOTE — ED Notes (Signed)
Onset 4 days ago dark tarry black stools two episodes today. Denies pain nausea, emesis, or diarrhea.  States shortness of breath and increase of oxygen from 2L to 3L [place] since August 30th aortic valve replacement.

## 2014-12-03 NOTE — ED Provider Notes (Signed)
CSN: [id number]     Arrival date & time 12/03/14  1800 History   First MD Initiated Contact with Patient 12/03/14 1801     Chief Complaint  Patient presents with  . Rectal Bleeding  . Shortness of Breath     (Consider location/radiation/quality/duration/timing/severity/associated sxs/prior Treatment) Patient is a 74 y.o. male presenting with GI illness.  GI Problem This is a new problem. Episode onset: 4 days. The problem occurs constantly. Progression since onset: waxing/waning, some days 4 black stool, some days 2. Associated symptoms include shortness of breath. Pertinent negatives include no chest pain, no abdominal pain and no headaches. Nothing aggravates the symptoms. Nothing relieves the symptoms. He has tried nothing for the symptoms.    Past Medical History  Diagnosis Date  . Polyp of nasal cavity   . Rosacea   . Benign neoplasm of colon   . Tobacco abuse   . Psychosexual dysfunction with inhibited sexual excitement   . Hypertension   . Hyperlipidemia   . GERD (gastroesophageal reflux disease)   . GI bleed 2010    4 units PRBCs  . Adenomatous colon polyp   . Peripheral vascular disease   . Irregular heartbeat   . Shingles   . Carotid artery occlusion   . Angiodysplasia of intestine with hemorrhage   . Pleural plaque with presence of asbestos 03/27/2013    Followed in Pulmonary clinic/ [facility]/ [place] - F/u CT 09/08/2013 1. Stable extensive calcified pleural plaque formation consistent with asbestos exposure. 2. Multiple pulmonary nodules are unchanged from the CT of 6 months ago. Given risk factors for lung cancer, continued follow up is recommended with chest CT in 6 months> done 04/20/14 no change >repeat in 12 m in tickle file     . GERD 11/30/2008    Qualifier: Diagnosis of  By: Marijean Niemann CMA, Danielle    . PVD (peripheral vascular disease) 10/18/2012  . Carotid artery stenosis 04/22/2012  . Coronary artery disease   . Heart murmur   . Emphysema/COPD   . COPD  GOLD III with min reversibilty  08/11/2006    Followed in Pulmonary clinic/ [facility]/ [place] - PFT's 04/28/2013  FEV1 0.88 (40%) with ratio 44 and 14% better p B2 dlco 45 corrects to 83 - Trial of breo 04/28/2013 > improved symptoms  06/09/2013  - spirometry 06/04/2014 FEV1  0.76 (29%) ratio 45      . Anemia   . Iron deficiency anemia 01/25/2009    Qualifier: Diagnosis of  By: Henrene Pastor MD, Docia Chuck   . History of blood transfusion "couple times"    "related to bleeding in colon and esophagus"  . Arthritis     "left shoulder" (10/19/2014)  . Anxiety   . 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 Iliac artery stent Left 2005    CIA  . Esophagogastroduodenoscopy  2012    normal  . Colonoscopy  July 2015    Dr. Henrene Pastor  . Tonsillectomy    . Knee arthroscopy with medial menisectomy Left 03/08/2014    Procedure: LEFT KNEE SCOPE WITH MEDIAL MENISECTOMY AND CHONDROPLASTY;  Surgeon: Ninetta Lights, MD;  Location: [place];  Service: Orthopedics;  Laterality: Left;  . Left and right heart catheterization with coronary angiogram N/A 06/28/2014    Procedure: LEFT AND RIGHT HEART CATHETERIZATION WITH CORONARY ANGIOGRAM;  Surgeon: Elta Guadeloupe  Lurline Del, MD;  Location: [place] CATH LAB;  Service: Cardiovascular;  Laterality: N/A;  . Esophagogastroduodenoscopy N/A 08/17/2014    Procedure: ESOPHAGOGASTRODUODENOSCOPY (EGD);  Surgeon: Jerene Bears, MD;  Location: [facility] ENDOSCOPY;  Service: Endoscopy;  Laterality: N/A;  . Colonoscopy N/A 08/17/2014    Procedure: COLONOSCOPY;  Surgeon: Jerene Bears, MD;  Location: [facility] ENDOSCOPY;  Service: Endoscopy;  Laterality: N/A;  . Cardiac catheterization  2001; 06/28/2014  . Cardiac catheterization N/A 10/19/2014    Procedure: Coronary Stent Intervention;  Surgeon: Burnell Blanks, MD;  Location: [place] CV LAB;  Service:  Cardiovascular;  Laterality: N/A;  BMS Mid RCA  . Transcatheter aortic valve replacement, transapical N/A 11/20/2014    Procedure: TRANSCATHETER AORTIC VALVE REPLACEMENT, TRANSAPICAL;  Surgeon: [name], MD;  Location: [place];  Service: Open Heart Surgery;  Laterality: N/A;  . Tee without cardioversion N/A 11/20/2014    Procedure: TRANSESOPHAGEAL ECHOCARDIOGRAM (TEE);  Surgeon: [name], MD;  Location: [place];  Service: Open Heart Surgery;  Laterality: N/A;  . Rib plating Left 11/20/2014    Procedure: RIB PLATING OF LEFT 8TH RIB;  Surgeon: [name], MD;  Location: [place];  Service: Open Heart Surgery;  Laterality: Left;   Family History  Problem Relation Age of Onset  . Lung disease Mother     pulm fibrosis  . Uterine cancer    . Colitis Father   . Colon cancer Neg Hx   . Heart disease Brother   . Hypertension Brother   . Hyperlipidemia Brother   . Heart disease Daughter     cad  . Hypertension Son    Social History  Substance Use Topics  . Smoking status: Former Smoker -- 1.00 packs/day for 60 years    Types: Cigarettes, E-cigarettes    Quit date: 04/17/2014  . Smokeless tobacco: Never Used  . Alcohol Use: 8.4 oz/week    14 Glasses of wine per week    Review of Systems  Constitutional: Positive for fatigue. Negative for fever.  HENT: Negative for sore throat.   Eyes: Negative for visual disturbance.  Respiratory: Positive for shortness of breath. Negative for cough (no change from baseline).   Cardiovascular: Negative for chest pain.  Gastrointestinal: Positive for diarrhea and blood in stool. Negative for nausea, vomiting, abdominal pain and constipation.  Genitourinary: Negative for difficulty urinating.  Musculoskeletal: Negative for back pain and neck stiffness.  Skin: Negative for rash.  Neurological: Negative for syncope and headaches.      Allergies  Review of patient's allergies indicates no known allergies.  Home Medications   Prior to  Admission medications   Medication Sig Start Date End Date Taking? Authorizing Provider  amiodarone (PACERONE) 200 MG tablet Take 1 tablet (200 mg total) by mouth 2 (two) times daily after a meal. 11/27/14  Yes Wayne E Gold, PA-C  amLODipine (NORVASC) 10 MG tablet Take 1 tablet (10 mg total) by mouth daily. 10/03/14  Yes Rosalita Chessman, DO  Ascorbic Acid (VITAMIN C) 250 MG tablet Take 500 mg by mouth daily.    Yes Historical Provider, MD  aspirin 81 MG chewable tablet Chew 1 tablet (81 mg total) by mouth daily. 10/20/14  Yes Brett Canales, PA-C  atorvastatin (LIPITOR) 40 MG tablet TAKE ONE TABLET BY MOUTH ONCE DAILY Patient taking differently: TAKE 40 MG BY MOUTH ONCE DAILY 10/17/14  Yes Yvonne R Lowne, DO  BREO ELLIPTA 100-25 MCG/INH AEPB INHALE ONE PUFF INTO LUNGS ONCE DAILY IN  THE MORNING 09/26/14  Yes Tanda Rockers, MD  clopidogrel (PLAVIX) 75 MG tablet Take 1 tablet (75 mg total) by mouth daily with breakfast. 10/20/14  Yes Brett Canales, PA-C  docusate sodium (COLACE) 100 MG capsule Take 100 mg by mouth 2 (two) times daily.    Yes Historical Provider, MD  fenofibrate 160 MG tablet Take 1 tablet (160 mg total) by mouth daily. 11/13/14  Yes Yvonne R Lowne, DO  Ipratropium-Albuterol (COMBIVENT RESPIMAT) 20-100 MCG/ACT AERS respimat Inhale 1 puff into the lungs 4 (four) times daily as needed for wheezing. 05/10/14  Yes Alferd Apa Lowne, DO  metoprolol tartrate (LOPRESSOR) 25 MG tablet Take 0.5 tablets (12.5 mg total) by mouth 2 (two) times daily. 08/18/14  Yes Charlynne Cousins, MD  Multiple Vitamins-Minerals (CENTRUM SILVER PO) Take 1 tablet by mouth daily.    Yes Historical Provider, MD  oxyCODONE (OXY IR/ROXICODONE) 5 MG immediate release tablet Take 1-2 tablets (5-10 mg total) by mouth every 6 (six) hours as needed for severe pain. 11/27/14  Yes Wayne E Gold, PA-C  pantoprazole (PROTONIX) 40 MG tablet TAKE ONE TABLET BY MOUTH ONCE DAILY Patient taking differently: Take 40 mg by mouth daily.  09/04/14  Yes  Yvonne R Lowne, DO  valsartan-hydrochlorothiazide (DIOVAN HCT) 160-12.5 MG per tablet Take 1 tablet by mouth daily. 09/04/14  Yes Yvonne R Lowne, DO   BP 145/44 mmHg  Pulse 81  Temp(Src) 98.1 F (36.7 C) (Oral)  Resp 29  Ht 5\' 3"  (1.6 m)  Wt 142 lb 4.8 oz (64.547 kg)  BMI 25.21 kg/m2  SpO2 100% Physical Exam  Constitutional: He is oriented to person, place, and time. He appears well-developed and well-nourished. No distress.  HENT:  Head: Normocephalic and atraumatic.  Eyes: Conjunctivae and EOM are normal.  Neck: Normal range of motion.  Cardiovascular: Normal rate, regular rhythm, normal heart sounds and intact distal pulses.  Exam reveals no gallop and no friction rub.   No murmur heard. Pulmonary/Chest: Effort normal. Tachypnea noted. No respiratory distress. He has decreased breath sounds in the left lower field. He has no wheezes. He has rales in the right lower field.  Abdominal: Soft. He exhibits no distension. There is no tenderness. There is no guarding.  Genitourinary: Rectal exam shows anal tone normal. Guaiac positive stool (black tarry stool).  Musculoskeletal: He exhibits no edema.  Neurological: He is alert and oriented to person, place, and time.  Skin: Skin is warm and dry. He is not diaphoretic. There is pallor.  Nursing note and vitals reviewed.   ED Course  Procedures (including critical care time) Labs Review Labs Reviewed  CBC WITH DIFFERENTIAL/PLATELET - Abnormal; Notable for the following:    WBC 15.9 (*)    RBC 1.71 (*)    Hemoglobin 5.1 (*)    HCT 16.1 (*)    RDW 16.0 (*)    Platelets 733 (*)    Neutrophils Relative % 80 (*)    Neutro Abs 12.7 (*)    Lymphocytes Relative 6 (*)    Monocytes Absolute 1.9 (*)    All other components within normal limits  PROTIME-INR - Abnormal; Notable for the following:    Prothrombin Time 16.0 (*)    All other components within normal limits  COMPREHENSIVE METABOLIC PANEL - Abnormal; Notable for the following:     Sodium 131 (*)    Chloride 97 (*)    Glucose, Bld 118 (*)    Calcium 8.7 (*)    Total  Protein 5.4 (*)    Albumin 2.6 (*)    All other components within normal limits  HEMOGLOBIN - Abnormal; Notable for the following:    Hemoglobin 7.3 (*)    All other components within normal limits  HEMATOCRIT - Abnormal; Notable for the following:    HCT 22.0 (*)    All other components within normal limits  POC OCCULT BLOOD, ED - Abnormal; Notable for the following:    Fecal Occult Bld POSITIVE (*)    All other components within normal limits  APTT  BRAIN NATRIURETIC PEPTIDE  BASIC METABOLIC PANEL  CBC  I-STAT CG4 LACTIC ACID, ED  I-STAT TROPOININ, ED  TYPE AND SCREEN  PREPARE RBC (CROSSMATCH)    Imaging Review Dg Chest 2 View  12/03/2014   CLINICAL DATA:  Dark stools onset 4 days ago. Shortness of breath and hypoxia. Aortic valve replacement 11/20/2014.  EXAM: CHEST  2 VIEW  COMPARISON:  11/23/2014, 05/31/2014 and chest CT 07/09/2014  FINDINGS: Lungs are adequately inflated and demonstrate worsening moderate size left pleural effusion likely with associated atelectasis in the left base. Cannot completely exclude infection in the left base. Evidence of known bilateral calcified pleural plaques. Cardiomediastinal silhouette is unchanged. There is calcified plaque over the thoracoabdominal aorta. Prosthetic aortic valve unchanged. Remainder of the exam is unchanged.  IMPRESSION: Worsening moderate size left pleural effusion likely with associated atelectasis. Cannot exclude infection in the left base.  Stable asbestos related pleural disease.   Electronically Signed   By: Marin Olp M.D.   On: 12/03/2014 19:55   I have personally reviewed and evaluated these images and lab results as part of my medical decision-making.   EKG Interpretation None      MDM   Final diagnoses:  Melena  Gastrointestinal hemorrhage, unspecified gastritis, unspecified gastrointestinal hemorrhage type  Acute  blood loss anemia   74 year old male accompanied a medical history including aortic stenosis now s/p TAVR on 8/30 (discharged 9/6)  CAD status post bare metal stent to the RCA in July on Plavix and aspirin, COPD, pleural pack plaques, history of GI bleeding secondary to angiodysplasia with admission in May both COPD and CHF on 3 L of oxygen,presents from the rehabilitation facility for 4 days of melena and shortness of breath. Patient reported chronically low diastolic blood pressures and reports his blood pressures were at baseline on arrival. He was not tachycardic however does take metoprolol. Patient with a 20-gauge IV, and requested a second large-bore IV, however 20-gauge was obtained. Given patient's continued hemodynamic stability feel 2 20g IV is appropriate at this time.  Denies CP, lightheadedness but does report dyspnea. No altered mental status. Lactate, trop, and renal function within normal limits.   Hemoglobin found to be 5.1, and 2 units of PRBCs were ordered.  Gastroenterology was consulted and will see pt in AM or sooner if hemodynamic indications arise.  Started protonix ggt.  XR shows worsened pleural effusion-pt afebrile, no increasing o2 requirement, and abx not ordered at this time.  Pt admitted to stepdown for acute blood loss anemia secondary to GI bleed.    Gareth Morgan, MD 12/04/14 [phone]

## 2014-12-04 ENCOUNTER — Encounter (HOSPITAL_COMMUNITY): Payer: Self-pay | Admitting: Physician Assistant

## 2014-12-04 DIAGNOSIS — I2510 Atherosclerotic heart disease of native coronary artery without angina pectoris: Secondary | ICD-10-CM | POA: Diagnosis present

## 2014-12-04 DIAGNOSIS — J438 Other emphysema: Secondary | ICD-10-CM | POA: Diagnosis present

## 2014-12-04 DIAGNOSIS — J9601 Acute respiratory failure with hypoxia: Secondary | ICD-10-CM | POA: Diagnosis present

## 2014-12-04 DIAGNOSIS — J90 Pleural effusion, not elsewhere classified: Secondary | ICD-10-CM | POA: Diagnosis present

## 2014-12-04 DIAGNOSIS — D62 Acute posthemorrhagic anemia: Secondary | ICD-10-CM

## 2014-12-04 DIAGNOSIS — I499 Cardiac arrhythmia, unspecified: Secondary | ICD-10-CM | POA: Diagnosis present

## 2014-12-04 DIAGNOSIS — Z952 Presence of prosthetic heart valve: Secondary | ICD-10-CM | POA: Diagnosis present

## 2014-12-04 DIAGNOSIS — I10 Essential (primary) hypertension: Secondary | ICD-10-CM

## 2014-12-04 LAB — POCT I-STAT 3, ART BLOOD GAS (G3+)
ACID-BASE EXCESS: 1 mmol/L (ref 0.0–2.0)
Bicarbonate: 25.8 mEq/L — ABNORMAL HIGH (ref 20.0–24.0)
O2 SAT: 96 %
PCO2 ART: 42.6 mmHg (ref 35.0–45.0)
PO2 ART: 83 mmHg (ref 80.0–100.0)
Patient temperature: 98.6
TCO2: 27 mmol/L (ref 0–100)
pH, Arterial: 7.39 (ref 7.350–7.450)

## 2014-12-04 LAB — CBC
HEMATOCRIT: 26 % — AB (ref 39.0–52.0)
Hemoglobin: 8.5 g/dL — ABNORMAL LOW (ref 13.0–17.0)
MCH: 29.1 pg (ref 26.0–34.0)
MCHC: 32.7 g/dL (ref 30.0–36.0)
MCV: 89 fL (ref 78.0–100.0)
PLATELETS: 582 10*3/uL — AB (ref 150–400)
RBC: 2.92 MIL/uL — AB (ref 4.22–5.81)
RDW: 16.5 % — ABNORMAL HIGH (ref 11.5–15.5)
WBC: 14.8 10*3/uL — AB (ref 4.0–10.5)

## 2014-12-04 LAB — BASIC METABOLIC PANEL
Anion gap: 7 (ref 5–15)
BUN: 15 mg/dL (ref 6–20)
CHLORIDE: 97 mmol/L — AB (ref 101–111)
CO2: 28 mmol/L (ref 22–32)
CREATININE: 0.7 mg/dL (ref 0.61–1.24)
Calcium: 8.4 mg/dL — ABNORMAL LOW (ref 8.9–10.3)
GFR calc non Af Amer: >60 mL/min (ref >60)
Glucose, Bld: 100 mg/dL — ABNORMAL HIGH (ref 65–99)
POTASSIUM: 4.2 mmol/L (ref 3.5–5.1)
Sodium: 132 mmol/L — ABNORMAL LOW (ref 135–145)

## 2014-12-04 LAB — TROPONIN I: Troponin I: 0.04 ng/mL — ABNORMAL HIGH (ref <0.031)

## 2014-12-04 MED ORDER — FUROSEMIDE 10 MG/ML IJ SOLN
60.0000 mg | Freq: Two times a day (BID) | INTRAMUSCULAR | Status: DC
  Administered 2014-12-05 – 2014-12-08 (×6): 60 mg via INTRAVENOUS
  Filled 2014-12-04 (×6): qty 6

## 2014-12-04 MED ORDER — SODIUM CHLORIDE 0.9 % IJ SOLN
3.0000 mL | INTRAMUSCULAR | Status: DC | PRN
  Administered 2014-12-04 – 2014-12-09 (×2): 3 mL via INTRAVENOUS
  Filled 2014-12-04 (×2): qty 3

## 2014-12-04 MED ORDER — PEG 3350-KCL-NA BICARB-NACL 420 G PO SOLR
2000.0000 mL | Freq: Once | ORAL | Status: AC
  Administered 2014-12-04: 2000 mL via ORAL
  Filled 2014-12-04: qty 4000

## 2014-12-04 MED ORDER — SALINE SPRAY 0.65 % NA SOLN
1.0000 | NASAL | Status: DC | PRN
  Administered 2014-12-05: 1 via NASAL
  Filled 2014-12-04: qty 44

## 2014-12-04 MED ORDER — CETYLPYRIDINIUM CHLORIDE 0.05 % MT LIQD
7.0000 mL | Freq: Two times a day (BID) | OROMUCOSAL | Status: DC
  Administered 2014-12-04 – 2014-12-09 (×8): 7 mL via OROMUCOSAL

## 2014-12-04 MED ORDER — PEG 3350-KCL-NA BICARB-NACL 420 G PO SOLR: 2000.0000 mL | Freq: Once | ORAL | Status: DC

## 2014-12-04 MED ORDER — PEG 3350-KCL-NA BICARB-NACL 420 G PO SOLR: 2000.0000 mL | Freq: Once | ORAL | Status: AC

## 2014-12-04 MED ORDER — PANTOPRAZOLE SODIUM 40 MG PO TBEC
40.0000 mg | DELAYED_RELEASE_TABLET | Freq: Every day | ORAL | Status: DC
  Administered 2014-12-04 – 2014-12-09 (×6): 40 mg via ORAL
  Filled 2014-12-04 (×6): qty 1

## 2014-12-04 MED ORDER — PEG 3350-KCL-NABCB-NACL-NASULF 236 G PO SOLR
4000.0000 mL | Freq: Once | ORAL | Status: AC
  Administered 2014-12-04: 4000 mL via ORAL
  Filled 2014-12-04: qty 4000

## 2014-12-04 MED ORDER — LORAZEPAM 2 MG/ML IJ SOLN
1.0000 mg | Freq: Four times a day (QID) | INTRAMUSCULAR | Status: DC | PRN
  Administered 2014-12-04 – 2014-12-06 (×2): 1 mg via INTRAVENOUS
  Administered 2014-12-06 – 2014-12-08 (×4): 2 mg via INTRAVENOUS
  Filled 2014-12-04 (×7): qty 1

## 2014-12-04 NOTE — Care Management Note (Signed)
Case Management Note  Patient Details  Name: ELYA DILORETO MRN: [id number] Date of Birth: [birth year]  Subjective/Objective:      Adm w gi bleed              Action/Plan:chart states from [facility], sw ref made   Expected Discharge Date:                  Expected Discharge Plan:  [place]  In-House Referral:  Clinical Social Work  Discharge planning Services     Post Acute Care Choice:    Choice offered to:     DME Arranged:    DME Agency:     HH Arranged:    [place] Agency:     Status of Service:     Medicare Important Message Given:    Date Medicare IM Given:    Medicare IM give by:    Date Additional Medicare IM Given:    Additional Medicare Important Message give by:     If discussed at [place] of Stay Meetings, dates discussed:    Additional Comments: ur review done  Lacretia Leigh, RN 12/04/2014, 8:20 AM

## 2014-12-04 NOTE — Progress Notes (Addendum)
TEAM 1 - Stepdown/ICU TEAM Progress Note  Ronald Bell XTK:[id number] DOB: 08-31-1940 DOA: 12/03/2014 PCP: [name], DO  Admit HPI / Brief Narrative: 74 y.o. male with PMHX Anxiety, tobacco abuse, HTN, HLD, irregular heartbeat, carotid artery stenosis, CAD native artery, heart murmur, severe AS, PVD, emphysema/COPD, GI bleed 2010,  and angiodysplasia of the colon  Who recently (July) had a stent placed, he then had TAVR. He was discharged on 9/6 to [facility]. He was on ASA and plavix. He then 4 days ago developed dark tarry stools. No N/V. No abdominal pain. Has had GI bleeding in the past that required ligation. He is more SOB since surgery as well. He was doing well otherwise in PT and was due to be released soon from [facility] (Per patient)  In the ER, his Hgb was found to be 5.1. He was started transfusions of 2 units. GI was consulted and will see in AM- [place], and he was started on protonix gtt. hospitalist were asked to admit   HPI/Subjective: 9/13 A/O 4, chronic SOB.   Assessment/Plan: ABLA due to GI bleed with angiodysplasia of the colon -EGD and colonoscopy performed on 08/17/2014. Showed multiple angiodysplastic lesions. -GI to perform Enteroscopy and colonoscopy on 8/14  -9/12 Transfused 2 units of PRBC  -protonix 40 mg daily per GI   CAD in native artery with Recent stent -09/2014: Severe mid RCA stenosis now s/p successful rotablator atherectomy of the mid RCA with bare metal stent placement.  -ASA and Plavix  held secondary to GI bleed. -Troponin 1 negative, EKG when compared to EKG from 8/30 no significant change, would consult cardiology if significant positive troponins.   S/p transcatheter aortic valve replacement (TAVR) -d/c'd 9/6 to [facility] -would notifiy CVTS in AM of admission  Irregular heartbeat  -Continue amiodarone 200 mg BID  Hypertension -Hold BP medication  Moderate left pleural effusion/LVH -Obtain  echocardiogram; if valve shows any signs of failure or significant change in structural function consult cardiology. -Strict in and out -Daily weight standing; -Post colonoscopy, start Lasix 60 mg BID, unless otherwise contraindicated -PCXR Thursday morning, if no improvement in pleural effusion will perform thoracentesis  Acute resp failure -Possibly multifactorial to include moderate pleural effusion on the left, increasing heart failure?  Titrate O2 to maintain SPO2> 89 -93%  -COPD - no active wheezing      Code Status: FULL Family Communication: no family present at time of exam Disposition Plan: Completion of GI bleed workup    Consultants: Dr.Steven Gaye Alken, (GI)   Procedure/Significant Events: 8/31 echocardiogram;Left ventricle: mild LVH. -LVEF= 55% to 60%.-(grade 1 diastolic dysfunction). 9/12 Transfused 2 units of PRBC   Culture   Antibiotics:  N/A  DVT prophylaxis:  SCD   Devices    LINES / TUBES:      Continuous Infusions:    Objective: VITAL SIGNS: Temp: 98 F (36.7 C) (09/13 2001) Temp Source: Oral (09/13 2001) BP: 152/39 mmHg (09/13 2001) Pulse Rate: 78 (09/13 2001) SPO2; FIO2:   Intake/Output Summary (Last 24 hours) at 12/04/14 2141 Last data filed at 12/04/14 1800  Gross per 24 hour  Intake 3795.42 ml  Output   3300 ml  Net 495.42 ml     Exam: General:  A/O 4, NAD, acute  respiratory distress Eyes: Negative headache, negative scleral hemorrhage ENT: Negative Runny nose, negative ear pain, negative gingival bleeding, Neck:  Negative scars, masses, torticollis, lymphadenopathy, JVD Lungs:  lingula/LLL no breath sounds appreciated, crackles RLL, poor  air movement throughout. Negative  wheezes Cardiovascular: Regular rate and rhythm without murmur gallop or rub normal S1 and S2 Abdomen:negative abdominal pain, nondistended, positive soft, bowel sounds, no rebound, no ascites, no appreciable mass Extremities: No  significant cyanosis, clubbing, or edema bilateral lower extremities Psychiatric:  Negative depression, negative anxiety, negative fatigue, negative mania Neurologic:  Cranial nerves II through XII intact, tongue/uvula midline, all extremities muscle strength 5/5, sensation intact throughout, negative dysarthria, negative expressive aphasia, negative receptive aphasia.   Data Reviewed: Basic Metabolic Panel:  Recent Labs Lab 12/03/14 2044 12/04/14 0230  NA 131* 132*  K 4.8 4.2  CL 97* 97*  CO2 27 28  GLUCOSE 118* 100*  BUN 17 15  CREATININE 0.78 0.70  CALCIUM 8.7* 8.4*   Liver Function Tests:  Recent Labs Lab 12/03/14 2044  AST 28  ALT 26  ALKPHOS 49  BILITOT 0.3  PROT 5.4*  ALBUMIN 2.6*   No results for input(s): LIPASE, AMYLASE in the last 168 hours. No results for input(s): AMMONIA in the last 168 hours. CBC:  Recent Labs Lab 12/03/14 1918 12/03/14 2310 12/04/14 0230  WBC 15.9*  --  14.8*  NEUTROABS 12.7*  --   --   HGB 5.1* 7.3* 8.5*  HCT 16.1* 22.0* 26.0*  MCV 94.2  --  89.0  PLT 733*  --  582*   Cardiac Enzymes:  Recent Labs Lab 12/04/14 1940  TROPONINI 0.04*   BNP (last 3 results)  Recent Labs  05/31/14 2351 12/03/14 1919  BNP 75.7 59.7    ProBNP (last 3 results) No results for input(s): PROBNP in the last 8760 hours.  CBG: No results for input(s): GLUCAP in the last 168 hours.  No results found for this or any previous visit (from the past 240 hour(s)).   Studies:  Recent x-ray studies have been reviewed in detail by the Attending Physician  Scheduled Meds:  Scheduled Meds: . amiodarone  200 mg Oral BID PC  . antiseptic oral rinse  7 mL Mouth Rinse BID  . [START ON 12/05/2014] furosemide  60 mg Intravenous BID  . metoprolol tartrate  12.5 mg Oral BID  . pantoprazole  40 mg Oral Daily  . [START ON 12/05/2014] polyethylene glycol-electrolytes  2,000 mL Oral Once  . sodium chloride  3 mL Intravenous Q12H    Time spent on care  of this patient: 40 mins   Kamron Vanwyhe, Geraldo Docker , MD  [facility] Office  [phone] Pager - [phone]  On-Call/Text Page:      [email or web address]      password [email or web address]  If 7PM-7AM, please contact night-coverage [email or web address] Password [email or web address] 12/04/2014, 9:41 PM   LOS: 1 day   Care during the described time interval was provided by me .  I have reviewed this patient's available data, including medical history, events of note, physical examination, and all test results as part of my evaluation. I have personally reviewed and interpreted all radiology studies.   Dia Crawford, MD [phone] Pager

## 2014-12-04 NOTE — Consult Note (Signed)
[facility] Consult: 11:47 AM 12/04/2014  LOS: 1 day    Referring Provider: Dr Sherral Hammers.  Primary Care Physician:  Ronald Koyanagi, DO Primary Gastroenterologist:  Dr. Henrene Pastor   Reason for Consultation:  Anemia and FOBT +.    HPI: Ronald Bell is a 74 y.o. male. Hx severe Ao stenosis.  S/p cardiac stent 09/2014. On chronic Plavix, ASA.  8/30 - 11/27/14 admission with symptomatic COPD and Ao stenosis and proceeded to TAVR.  Transfused one PRBC on 11/20/14.   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 Transfused one unit PRBC 08/16/14. 08/17/14  Colonoscopy for melena and anemia and need for Plavix.  Dr Hilarie Fredrickson ablated AVMs in cecum and ascending colon.  2 small polyps at descending and rectosigmoid were not removed or biopsied.  08/17/14  EGD for same.  Ablated 3 gastric, 4 duodenal, 6 jejunal AVms.  10/2013  Colonoscopy for personal hx adenomatous polyps piecemeal extracted in 2012 and 2013.  Dr Henrene Pastor removed 3 diminutive polyps (HP and tubular adenomas). Cecal AVM noted (not ablated).  Sigmoid tics.    Admitted with symptomatic anemia, Hgb 5.1 (10.2 on 9/3). MCV normal at 89.  S/p PRBCs x 2.   Stools have been dark, formed x 4 days.  No nausea, abd pain, BPR, dysphagia. Good appetite BUN normal.   Overall says his breathing is worse since TAVR, now needing home oxygen, not the case before surgery.  Has upcoming OV with Dr Roxy Manns on Monday 9/19  Past Medical History  Diagnosis Date  . Polyp of nasal cavity   . Rosacea   . Hx of adenomatous colonic polyps 2012, 2013.   . Tobacco abuse   . Hypertension   . Hyperlipidemia   . GERD (gastroesophageal reflux disease)   . GI bleed 2010    4 units PRBCs  . Peripheral vascular disease   . Irregular heartbeat   . Shingles   . Carotid  artery occlusion   . Angiodysplasia of intestine with hemorrhage     large and SB, gastric AVMs.   . Pleural plaque with presence of asbestos 03/27/2013    Followed in Pulmonary clinic/ [facility]/ [place] - F/u CT 09/08/2013 1. Stable extensive calcified pleural plaque formation consistent with asbestos exposure. 2. Multiple pulmonary nodules are unchanged from the CT of 6 months ago. Given risk factors for lung cancer, continued follow up is recommended with chest CT in 6 months> done 04/20/14 no change >repeat in 12 m in tickle file     . GERD 11/30/2008    Qualifier: Diagnosis of  By: Marijean Niemann CMA, Danielle    . PVD (peripheral vascular disease) 10/18/2012  . Carotid artery stenosis 04/22/2012  . Coronary artery disease   . COPD GOLD III with min reversibilty  08/11/2006    Followed in Pulmonary clinic/ [facility]/ [place] - PFT's 04/28/2013  FEV1 0.88 (40%) with ratio 44 and 14% better p B2 dlco 45 corrects to 83 - Trial of breo 04/28/2013 > improved symptoms  06/09/2013  -  spirometry 06/04/2014 FEV1  0.76 (29%) ratio 45      . Iron deficiency anemia 01/25/2009    Qualifier: Diagnosis of  By: Henrene Pastor MD, Docia Chuck   . History of blood transfusion "couple times"    "related to bleeding in colon and esophagus"  . Arthritis     "left shoulder" (10/19/2014)  . Anxiety   . 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 Iliac artery stent Left 2005    CIA  . Esophagogastroduodenoscopy  2012    normal  . Colonoscopy  July 2015    Dr. Henrene Pastor  . Tonsillectomy    . Knee arthroscopy with medial menisectomy Left 03/08/2014    Procedure: LEFT KNEE SCOPE WITH MEDIAL MENISECTOMY AND CHONDROPLASTY;  Surgeon: Ninetta Lights, MD;  Location: [place];  Service: Orthopedics;  Laterality: Left;  . Left and right heart catheterization with coronary angiogram N/A  06/28/2014    Procedure: LEFT AND RIGHT HEART CATHETERIZATION WITH CORONARY ANGIOGRAM;  Surgeon: Jerline Pain, MD;  Location: [facility] CATH LAB;  Service: Cardiovascular;  Laterality: N/A;  . Esophagogastroduodenoscopy N/A 08/17/2014    Procedure: ESOPHAGOGASTRODUODENOSCOPY (EGD);  Surgeon: Jerene Bears, MD;  Location: [facility] ENDOSCOPY;  Service: Endoscopy;  Laterality: N/A;  . Colonoscopy N/A 08/17/2014    Procedure: COLONOSCOPY;  Surgeon: Jerene Bears, MD;  Location: [facility] ENDOSCOPY;  Service: Endoscopy;  Laterality: N/A;  . Cardiac catheterization  2001; 06/28/2014  . Cardiac catheterization N/A 10/19/2014    Procedure: Coronary Stent Intervention;  Surgeon: Burnell Blanks, MD;  Location: [place] CV LAB;  Service: Cardiovascular;  Laterality: N/A;  BMS Mid RCA  . Transcatheter aortic valve replacement, transapical N/A 11/20/2014    Procedure: TRANSCATHETER AORTIC VALVE REPLACEMENT, TRANSAPICAL;  Surgeon: [name], MD;  Location: [place];  Service: Open Heart Surgery;  Laterality: N/A;  . Tee without cardioversion N/A 11/20/2014    Procedure: TRANSESOPHAGEAL ECHOCARDIOGRAM (TEE);  Surgeon: [name], MD;  Location: [place];  Service: Open Heart Surgery;  Laterality: N/A;  . Rib plating Left 11/20/2014    Procedure: RIB PLATING OF LEFT 8TH RIB;  Surgeon: [name], MD;  Location: [place];  Service: Open Heart Surgery;  Laterality: Left;    Prior to Admission medications   Medication Sig Start Date End Date Taking? Authorizing Provider  amiodarone (PACERONE) 200 MG tablet Take 1 tablet (200 mg total) by mouth 2 (two) times daily after a meal. 11/27/14  Yes Wayne E Gold, PA-C  amLODipine (NORVASC) 10 MG tablet Take 1 tablet (10 mg total) by mouth daily. 10/03/14  Yes Rosalita Chessman, DO  Ascorbic Acid (VITAMIN C) 250 MG tablet Take 500 mg by mouth daily.    Yes Historical Provider, MD  aspirin 81 MG chewable tablet Chew 1 tablet (81 mg total) by mouth daily. 10/20/14  Yes Brett Canales, PA-C    atorvastatin (LIPITOR) 40 MG tablet TAKE ONE TABLET BY MOUTH ONCE DAILY Patient taking differently: TAKE 40 MG BY MOUTH ONCE DAILY 10/17/14  Yes Yvonne R Lowne, DO  BREO ELLIPTA 100-25 MCG/INH AEPB INHALE ONE PUFF INTO LUNGS ONCE DAILY IN THE MORNING 09/26/14  Yes Tanda Rockers, MD  clopidogrel (PLAVIX) 75 MG tablet Take 1 tablet (75 mg total) by mouth daily with breakfast. 10/20/14  Yes Brett Canales, PA-C  docusate sodium (COLACE) 100 MG capsule Take 100 mg by mouth 2 (two) times daily.    Yes Historical Provider, MD  fenofibrate 160 MG tablet Take 1 tablet (160 mg total) by mouth daily. 11/13/14  Yes Yvonne R Lowne, DO  Ipratropium-Albuterol (COMBIVENT RESPIMAT) 20-100 MCG/ACT AERS respimat Inhale 1 puff into the lungs 4 (four) times daily as needed for wheezing. 05/10/14  Yes Alferd Apa Lowne, DO  metoprolol tartrate (LOPRESSOR) 25 MG tablet Take 0.5 tablets (12.5 mg total) by mouth 2 (two) times daily. 08/18/14  Yes Charlynne Cousins, MD  Multiple Vitamins-Minerals (CENTRUM SILVER PO) Take 1 tablet by mouth daily.    Yes Historical Provider, MD  oxyCODONE (OXY IR/ROXICODONE) 5 MG immediate release tablet Take 1-2 tablets (5-10 mg total) by mouth every 6 (six) hours as needed for severe pain. 11/27/14  Yes Wayne E Gold, PA-C  pantoprazole (PROTONIX) 40 MG tablet TAKE ONE TABLET BY MOUTH ONCE DAILY Patient taking differently: Take 40 mg by mouth daily.  09/04/14  Yes Yvonne R Lowne, DO  valsartan-hydrochlorothiazide (DIOVAN HCT) 160-12.5 MG per tablet Take 1 tablet by mouth daily. 09/04/14  Yes Rosalita Chessman, DO    Scheduled Meds: . amiodarone  200 mg Oral BID PC  . antiseptic oral rinse  7 mL Mouth Rinse BID  . metoprolol tartrate  12.5 mg Oral BID  . sodium chloride  3 mL Intravenous Q12H   Infusions: . pantoprozole (PROTONIX) infusion 8 mg/hr (12/04/14 0543)   PRN Meds: acetaminophen **OR** acetaminophen, ipratropium-albuterol, LORazepam   Allergies as of 12/03/2014  . (No Known  Allergies)    Family History  Problem Relation Age of Onset  . Lung disease Mother     pulm fibrosis  . Uterine cancer    . Colitis Father   . Colon cancer Neg Hx   . Heart disease Brother   . Hypertension Brother   . Hyperlipidemia Brother   . Heart disease Daughter     cad  . Hypertension Son     Social History   Social History  . Marital Status: Married    Spouse Name: N/A  . Number of Children: 4  . Years of Education: N/A   Occupational History  . Charity fundraiser    Social History Main Topics  . Smoking status: Former Smoker -- 1.00 packs/day for 60 years    Types: Cigarettes, E-cigarettes    Quit date: 04/17/2014  . Smokeless tobacco: Never Used  . Alcohol Use: 8.4 oz/week    14 Glasses of wine per week  . Drug Use: No  . Sexual Activity: No   Other Topics Concern  . Not on file   Social History Narrative    REVIEW OF SYSTEMS: Constitutional:  Feels tired and week ENT:  No nose bleeds Pulm:  SOB and prductive cough.  Requiring home O2.  CV:  No palpitations, no LE edema. No chest pain.  GU:  No hematuria, no frequency GI:  Per HPI Heme:  Per HPI   Transfusions:  Per HPI Neuro:  No headaches, no peripheral tingling or numbness Derm:  No itching, no rash or sores.  Endocrine:  No sweats or chills.  No polyuria or dysuria Immunization:  reviewed Travel:  None beyond local counties in last few months.    PHYSICAL EXAM: Vital signs in last 24 hours: Filed Vitals:   12/04/14 0917  BP: 132/46  Pulse: 88  Temp:   Resp: 25   Wt Readings from Last 3  Encounters:  12/03/14 142 lb 4.8 oz (64.547 kg)  11/30/14 145 lb 12.8 oz (66.134 kg)  11/28/14 145 lb 11.2 oz (66.089 kg)    General: ill looking WM who is pale.  comfortable Head:  No swelling or asymmetry  Eyes:  No icterus, conjunctiva pale Ears:  Slightly HOH  Nose:  No congestion or discharge Mouth:  Clear and moist MM Neck:  No mass, no TMG, no JVD Lungs:  Diminished but clear. +  cough.  Heart: RRR, no mrg, s2/s2 audible Abdomen:  Soft, NT, ND.  No HSM or mass.   Rectal: deferred   Musc/Skeltl: no joint swelling or redness.  kyphosis Extremities:  No CCE.    Neurologic:  Oriented x 3.  No tremor, no gross deficits.  Moves all 4 limbs, strength not tested Skin:  No rash.  Multiple solar/aktinic keratosis Tattoos:  none Nodes:  No inguinal adenopathy   Psych:  Cooperative, pleasant, relaxed.    Intake/Output from previous day: 09/12 0701 - 09/13 0700 In: 1343.4 [P.O.:150; I.V.:500.4; Blood:693] Out: 700 [Urine:700] Intake/Output this shift: Total I/O In: -  Out: 201 [Urine:200; Stool:1]  LAB RESULTS:  Recent Labs  12/03/14 1918 12/03/14 2310 12/04/14 0230  WBC 15.9*  --  14.8*  HGB 5.1* 7.3* 8.5*  HCT 16.1* 22.0* 26.0*  PLT 733*  --  582*   BMET Lab Results  Component Value Date   NA 132* 12/04/2014   NA 131* 12/03/2014   NA 133* 11/24/2014   K 4.2 12/04/2014   K 4.8 12/03/2014   K 4.1 11/24/2014   CL 97* 12/04/2014   CL 97* 12/03/2014   CL 98* 11/24/2014   CO2 28 12/04/2014   CO2 27 12/03/2014   CO2 28 11/24/2014   GLUCOSE 100* 12/04/2014   GLUCOSE 118* 12/03/2014   GLUCOSE 105* 11/24/2014   BUN 15 12/04/2014   BUN 17 12/03/2014   BUN 16 11/24/2014   CREATININE 0.70 12/04/2014   CREATININE 0.78 12/03/2014   CREATININE 0.73 11/24/2014   CALCIUM 8.4* 12/04/2014   CALCIUM 8.7* 12/03/2014   CALCIUM 8.3* 11/24/2014   LFT  Recent Labs  12/03/14 2044  PROT 5.4*  ALBUMIN 2.6*  AST 28  ALT 26  ALKPHOS 49  BILITOT 0.3    RADIOLOGY STUDIES: Dg Chest 2 View  12/03/2014   CLINICAL DATA:  Dark stools onset 4 days ago. Shortness of breath and hypoxia. Aortic valve replacement 11/20/2014.  EXAM: CHEST  2 VIEW  COMPARISON:  11/23/2014, 05/31/2014 and chest CT 07/09/2014  FINDINGS: Lungs are adequately inflated and demonstrate worsening moderate size left pleural effusion likely with associated atelectasis in the left base. Cannot  completely exclude infection in the left base. Evidence of known bilateral calcified pleural plaques. Cardiomediastinal silhouette is unchanged. There is calcified plaque over the thoracoabdominal aorta. Prosthetic aortic valve unchanged. Remainder of the exam is unchanged.  IMPRESSION: Worsening moderate size left pleural effusion likely with associated atelectasis. Cannot exclude infection in the left base.  Stable asbestos related pleural disease.   Electronically Signed   By: Marin Olp M.D.   On: 12/03/2014 19:55    ENDOSCOPIC STUDIES: As per HPI  IMPRESSION:   *  Recurrent anemia and GI bleeding.  Likely from AVMs in setting of Plavix and ASA.  *  S/p TAVR 10/2014. Cardiac stent/rotoblator 09/2013.  ASA and plavix on hold   *  Left pleural effusion.    *  Hx adenomatous colon polyps.     PLAN:     *  Enteroscopy and colonoscopy tomorrow.  Golytely prep. Clears today.   *  stopped the PPI drip, this is not an ulcer.  AVM bleeds not responsive to PPI.  He takes daily Protonix at home, restarted this.  *  CBC in AM    Azucena Freed  12/04/2014, 11:47 AM Pager: [phone]     Attending Addendum: I have taken an interval history, reviewed the chart, and examined the patient. I agree with the Advanced Practitioner's note and impression. Patient well known to our GI service. He has had multiple bleeds in the past, from both colonic and gastric/small bowel AVMs. Now s/p aortic valve replacement and cardiac sent in July in need of plavix use. Readmitted with GI bleeding. Suspect he has bled from upper tract AVMs however right colon is also possible given he had this treated on his last exam as well. Given his need for antiplatelet therapy moving forward, recommend both EGD and colonoscopy to ablate lesions noted to minimize his risk for future bleeds. Depending on the AVM burden, we could consider monthly octreotide depot injections to reduce the risk of AVM bleeding, if cleared by  cardiology given some of its side effects. Otherwise, clinically stable at this time, plan for prep today with procedures tomorrow. Call if changes in his status in the interim.   Bonfield Cellar, MD [facility] Pager [phone]

## 2014-12-04 NOTE — Progress Notes (Signed)
Patient insisting on keeping his combivent inhaler at bedside for "acute attack" if needed. Stated that he normally uses it about 4 x per day.

## 2014-12-05 ENCOUNTER — Inpatient Hospital Stay (HOSPITAL_COMMUNITY): Payer: Medicare Other | Admitting: Certified Registered"

## 2014-12-05 ENCOUNTER — Encounter (HOSPITAL_COMMUNITY)
Admission: EM | Disposition: A | Discharge status: Home / Self Care | Payer: Self-pay | Source: Home / Self Care | Attending: Internal Medicine

## 2014-12-05 ENCOUNTER — Inpatient Hospital Stay (HOSPITAL_COMMUNITY): Payer: Medicare Other

## 2014-12-05 ENCOUNTER — Encounter (HOSPITAL_COMMUNITY): Payer: Self-pay | Admitting: *Deleted

## 2014-12-05 ENCOUNTER — Other Ambulatory Visit (HOSPITAL_COMMUNITY): Payer: Medicare Other

## 2014-12-05 DIAGNOSIS — K5521 Angiodysplasia of colon with hemorrhage: Secondary | ICD-10-CM | POA: Diagnosis present

## 2014-12-05 DIAGNOSIS — K922 Gastrointestinal hemorrhage, unspecified: Secondary | ICD-10-CM

## 2014-12-05 DIAGNOSIS — D125 Benign neoplasm of sigmoid colon: Secondary | ICD-10-CM

## 2014-12-05 DIAGNOSIS — Z86010 Personal history of colonic polyps: Secondary | ICD-10-CM | POA: Diagnosis present

## 2014-12-05 DIAGNOSIS — I2510 Atherosclerotic heart disease of native coronary artery without angina pectoris: Secondary | ICD-10-CM

## 2014-12-05 DIAGNOSIS — K5520 Angiodysplasia of colon without hemorrhage: Secondary | ICD-10-CM

## 2014-12-05 DIAGNOSIS — K558 Other vascular disorders of intestine: Secondary | ICD-10-CM

## 2014-12-05 DIAGNOSIS — J90 Pleural effusion, not elsewhere classified: Secondary | ICD-10-CM

## 2014-12-05 HISTORY — PX: ENTEROSCOPY: SHX5533

## 2014-12-05 HISTORY — PX: COLONOSCOPY: SHX5424

## 2014-12-05 LAB — COMPREHENSIVE METABOLIC PANEL
ALBUMIN: 2.6 g/dL — AB (ref 3.5–5.0)
ALT: 23 U/L (ref 17–63)
ANION GAP: 5 (ref 5–15)
AST: 20 U/L (ref 15–41)
Alkaline Phosphatase: 54 U/L (ref 38–126)
BUN: 6 mg/dL (ref 6–20)
CO2: 30 mmol/L (ref 22–32)
Calcium: 8.7 mg/dL — ABNORMAL LOW (ref 8.9–10.3)
Chloride: 98 mmol/L — ABNORMAL LOW (ref 101–111)
Creatinine, Ser: 0.67 mg/dL (ref 0.61–1.24)
GFR calc Af Amer: >60 mL/min (ref >60)
GFR calc non Af Amer: >60 mL/min (ref >60)
GLUCOSE: 97 mg/dL (ref 65–99)
POTASSIUM: 4.2 mmol/L (ref 3.5–5.1)
SODIUM: 133 mmol/L — AB (ref 135–145)
TOTAL PROTEIN: 5.9 g/dL — AB (ref 6.5–8.1)
Total Bilirubin: 0.7 mg/dL (ref 0.3–1.2)

## 2014-12-05 LAB — CBC
HEMATOCRIT: 23.5 % — AB (ref 39.0–52.0)
HEMOGLOBIN: 7.7 g/dL — AB (ref 13.0–17.0)
MCH: 30 pg (ref 26.0–34.0)
MCHC: 32.8 g/dL (ref 30.0–36.0)
MCV: 91.4 fL (ref 78.0–100.0)
PLATELETS: 595 10*3/uL — AB (ref 150–400)
RBC: 2.57 MIL/uL — AB (ref 4.22–5.81)
RDW: 17 % — ABNORMAL HIGH (ref 11.5–15.5)
WBC: 16.4 10*3/uL — ABNORMAL HIGH (ref 4.0–10.5)

## 2014-12-05 LAB — CBC WITH DIFFERENTIAL/PLATELET
BASOS PCT: 0 %
Basophils Absolute: 0.1 10*3/uL (ref 0.0–0.1)
Eosinophils Absolute: 0.5 10*3/uL (ref 0.0–0.7)
Eosinophils Relative: 4 %
HEMATOCRIT: 24 % — AB (ref 39.0–52.0)
HEMOGLOBIN: 7.9 g/dL — AB (ref 13.0–17.0)
Lymphocytes Relative: 5 %
Lymphs Abs: 0.7 10*3/uL (ref 0.7–4.0)
MCH: 29.8 pg (ref 26.0–34.0)
MCHC: 32.9 g/dL (ref 30.0–36.0)
MCV: 90.6 fL (ref 78.0–100.0)
MONOS PCT: 10 %
Monocytes Absolute: 1.2 10*3/uL — ABNORMAL HIGH (ref 0.1–1.0)
NEUTROS ABS: 9.9 10*3/uL — AB (ref 1.7–7.7)
NEUTROS PCT: 81 %
Platelets: 588 10*3/uL — ABNORMAL HIGH (ref 150–400)
RBC: 2.65 MIL/uL — ABNORMAL LOW (ref 4.22–5.81)
RDW: 17.1 % — ABNORMAL HIGH (ref 11.5–15.5)
WBC: 12.4 10*3/uL — ABNORMAL HIGH (ref 4.0–10.5)

## 2014-12-05 LAB — PREPARE RBC (CROSSMATCH)

## 2014-12-05 LAB — TROPONIN I
TROPONIN I: 0.03 ng/mL (ref <0.031)
TROPONIN I: 0.03 ng/mL (ref <0.031)

## 2014-12-05 LAB — MAGNESIUM: Magnesium: 2 mg/dL (ref 1.7–2.4)

## 2014-12-05 SURGERY — COLONOSCOPY
Anesthesia: Monitor Anesthesia Care

## 2014-12-05 MED ORDER — LACTATED RINGERS IV SOLN
INTRAVENOUS | Status: DC
  Administered 2014-12-05 (×2): via INTRAVENOUS

## 2014-12-05 MED ORDER — PROPOFOL INFUSION 10 MG/ML OPTIME
INTRAVENOUS | Status: DC | PRN
  Administered 2014-12-05: 75 ug/kg/min via INTRAVENOUS

## 2014-12-05 MED ORDER — FLUTICASONE FUROATE-VILANTEROL 100-25 MCG/INH IN AEPB
100.0000 ug | INHALATION_SPRAY | Freq: Every day | RESPIRATORY_TRACT | Status: DC
  Administered 2014-12-05 – 2014-12-09 (×3): 100 ug via RESPIRATORY_TRACT

## 2014-12-05 MED ORDER — EPINEPHRINE HCL 0.1 MG/ML IJ SOSY
PREFILLED_SYRINGE | INTRAMUSCULAR | Status: AC
  Filled 2014-12-05: qty 10

## 2014-12-05 MED ORDER — SODIUM CHLORIDE 0.9 % IV SOLN: INTRAVENOUS | Status: DC

## 2014-12-05 MED ORDER — SPOT INK MARKER SYRINGE KIT
PACK | SUBMUCOSAL | Status: DC | PRN
  Administered 2014-12-05: 3 mL via SUBMUCOSAL

## 2014-12-05 MED ORDER — PROPOFOL 10 MG/ML IV BOLUS
INTRAVENOUS | Status: DC | PRN
  Administered 2014-12-05 (×6): 10 mg via INTRAVENOUS

## 2014-12-05 MED ORDER — SPOT INK MARKER SYRINGE KIT
PACK | SUBMUCOSAL | Status: AC
  Filled 2014-12-05: qty 10

## 2014-12-05 MED ORDER — SODIUM CHLORIDE 0.9 % IJ SOLN
PREFILLED_SYRINGE | INTRAMUSCULAR | Status: DC | PRN
  Administered 2014-12-05: 5 mL

## 2014-12-05 MED ORDER — PHENYLEPHRINE HCL 10 MG/ML IJ SOLN
INTRAMUSCULAR | Status: DC | PRN
  Administered 2014-12-05 (×4): 80 ug via INTRAVENOUS

## 2014-12-05 MED ORDER — SODIUM CHLORIDE 0.9 % IV SOLN
Freq: Once | INTRAVENOUS | Status: AC
  Administered 2014-12-05: 19:00:00 via INTRAVENOUS

## 2014-12-05 MED ORDER — EPHEDRINE SULFATE 50 MG/ML IJ SOLN
INTRAMUSCULAR | Status: DC | PRN
  Administered 2014-12-05 (×2): 5 mg via INTRAVENOUS

## 2014-12-05 NOTE — Transfer of Care (Signed)
Immediate Anesthesia Transfer of Care Note  Patient: Ronald Bell  Procedure(s) Performed: Procedure(s): COLONOSCOPY (N/A) ENTEROSCOPY (N/A)  Patient Location: Endoscopy Unit  Anesthesia Type:MAC  Level of Consciousness: awake, alert , oriented and sedated  Airway & Oxygen Therapy: Patient Spontanous Breathing and Patient connected to nasal cannula oxygen  Post-op Assessment: Report given to RN, Post -op Vital signs reviewed and stable and Patient moving all extremities X 4  Post vital signs: Reviewed and stable  Last Vitals:  Filed Vitals:   12/05/14 1250  BP:   Pulse:   Temp: 36.5 C  Resp:     Complications: No apparent anesthesia complications

## 2014-12-05 NOTE — Op Note (Signed)
[facility] [place], [id number]   ENTEROSCOPY PROCEDURE REPORT     EXAM DATE: 12/05/2014  PATIENT NAME:      Unknown, Schleyer           MR #: [id number] BIRTHDATE:       Aug 10, 1940      VISIT #:     [id number]  ATTENDING:     [place] Cellar, MD     STATUS:     inpatient ASSISTANT:      Cletis Athens and Doran Heater MD: ASA CLASS:        Class III  INDICATIONS:  The patient is a 74 yr old male here for an enteroscopy procedure due to anemia. PROCEDURE PERFORMED:     Small bowel enteroscopy with ablation therapy Small bowel enteroscopy with control of bleeding  MEDICATIONS:     Per Anesthesia  CONSENT: The patient understands the risks and benefits of the procedure and understands that these risks include, but are not limited to: sedation, allergic reaction, infection, perforation and/or bleeding. Alternative means of evaluation and treatment include, among others: physical exam, x-rays, and/or surgical intervention. The patient elects to proceed with this endoscopic procedure.  DESCRIPTION OF PROCEDURE: During intra-op preparation period all mechanical & medical equipment was checked for proper function. Hand hygiene and appropriate measures for infection prevention was taken. After the risks, benefits and alternatives of the procedure were thoroughly explained, Informed consent was verified, confirmed and timeout was successfully executed by the treatment team. The    endoscope was introduced through the mouth and advanced to the proximal jejunum jejunum. The prep was The overall prep quality was adequate.. The instrument was then slowly withdrawn while examining the mucosa circumferentially. The scope was then completely withdrawn from the patient and the procedure terminated. The pulse, BP, and O2 saturation were monitored and documented by the physician and the nursing staff throughout the entire  procedure.  The patient was cared for as planned according to standard protocol, then discharged to recovery in stable condition and with appropriate post procedure care. Estimated blood loss is zero unless otherwise noted in this procedure report.  The esophagus appeared normal.  DH, GEJ, and SCJ located at 40cm from the incisors.  One small gastric AVM with some slight oozing was noted in the gastric body and ablated using APC.  The remainder of the stomach was normal.  There were 2 small AVMs with some suspected oozing in the proximal jejunum and ablated with APC. There was a subepithelial nodule 1-2 cm in size noted in the distal duodenum.  Biopsies showed likely adipose tissue and this is likely an adenoma however it was tattood in case biopsies suggest otherwise.  Another AVM was noted in the duodenum and was oozing, treated with APC and epinphrine injection with good result and hemostasis.  The remainder of the duodenum was normal.  Upon withdrawing the endoscope a hematoma was noted in the distal esophagus, likely due to the patient wretching during the procedure, but no active bleeding was noted at the site.    ADVERSE EVENTS:      There were no immediate complications.  IMPRESSIONS:     2 jejunal AVMs ablated 1 subepithelial duodenal lesion, biopsied and tattooed - likely benign lipoma 1 duodenal AVM ablated and treated with epinephrine 1 gastric AVM ablated   RECOMMENDATIONS:     Clear liquid diet Serial labs, monitor for recurrent bleeding May consider use of octreotide  longer term if he continues to have bleeding from AVMs, if no contraindications given some cardiac side effects GI service will continue to follow   _____________________________ [place] Cellar, MD eSigned:  [place] Cellar, MD 12/05/2014 1:07 PM   cc:     PATIENT NAME:  Jedd, Schulenburg MR#: [id number]

## 2014-12-05 NOTE — Progress Notes (Signed)
Pt back from endo, AAOx4, NAD, VSS, pt wanting something to eat. Wife tearful, at bedside, reassurance provided.

## 2014-12-05 NOTE — Anesthesia Preprocedure Evaluation (Addendum)
Anesthesia Evaluation  Patient identified by MRN, date of birth, ID band Patient awake    Reviewed: Allergy & Precautions, NPO status , Patient's Chart, lab work & pertinent test results, reviewed documented beta blocker date and time   History of Anesthesia Complications Negative for: history of anesthetic complications  Airway Mallampati: II  TM Distance: >3 FB Neck ROM: Full    Dental  (+) Edentulous Upper, Edentulous Lower   Pulmonary shortness of breath, COPD, former smoker,    breath sounds clear to auscultation- rhonchi       Cardiovascular hypertension, Pt. on medications and Pt. on home beta blockers + angina with exertion + CAD and + Peripheral Vascular Disease  + Valvular Problems/Murmurs  Rhythm:Regular     Neuro/Psych PSYCHIATRIC DISORDERS Anxiety  Neuromuscular disease    GI/Hepatic Neg liver ROS, GERD  Medicated,Gastric avms   Endo/Other  negative endocrine ROS  Renal/GU negative Renal ROS     Musculoskeletal  (+) Arthritis ,   Abdominal   Peds  Hematology  (+) anemia ,   Anesthesia Other Findings   Reproductive/Obstetrics                            Anesthesia Physical Anesthesia Plan  ASA: III  Anesthesia Plan: MAC   Post-op Pain Management:    Induction: Intravenous  Airway Management Planned: Nasal Cannula  Additional Equipment: None  Intra-op Plan:   Post-operative Plan:   Informed Consent: I have reviewed the patients History and Physical, chart, labs and discussed the procedure including the risks, benefits and alternatives for the proposed anesthesia with the patient or authorized representative who has indicated his/her understanding and acceptance.   Dental advisory given  Plan Discussed with: CRNA and Surgeon  Anesthesia Plan Comments:         Anesthesia Quick Evaluation

## 2014-12-05 NOTE — Progress Notes (Addendum)
[name]       [name], [address]             [phone]     CARDIOTHORACIC SURGERY PROGRESS NOTE  Day of Surgery  S/P Procedure(s) (LRB): COLONOSCOPY (N/A) ENTEROSCOPY (N/A)  Subjective: Feels better since transfusion, but still w/ some dyspnea  Objective: Vital signs in last 24 hours: Temp:  [97.4 F (36.3 C)-98 F (36.7 C)] 97.4 F (36.3 C) (09/14 1600) Pulse Rate:  [62-86] 72 (09/14 1600) Cardiac Rhythm:  [-] Normal sinus rhythm (09/14 1600) Resp:  [19-26] 19 (09/14 1600) BP: (99-152)/(10-82) 117/74 mmHg (09/14 1600) SpO2:  [96 %-100 %] 96 % (09/14 1600) Weight:  [64.275 kg (141 lb 11.2 oz)] 64.275 kg (141 lb 11.2 oz) (09/14 0500)  Physical Exam:  Rhythm:   sinus  Breath sounds: Diminished left base  Heart sounds:  RRR  Incisions:  Healing nicely  Abdomen:  Soft, non-distended, non-tender  Extremities:  Warm, well-perfused    Intake/Output from previous day: 09/13 0701 - 09/14 0700 In: 2990 [P.O.:2840; I.V.:150] Out: 2600 [Urine:750; Stool:1850] Intake/Output this shift: Total I/O In: 600 [P.O.:600] Out: 1475 [Urine:1075; Stool:400]  Lab Results:  Recent Labs  12/05/14 0559 12/05/14 1356  WBC 12.4* 16.4*  HGB 7.9* 7.7*  HCT 24.0* 23.5*  PLT 588* 595*   BMET:  Recent Labs  12/04/14 0230 12/05/14 0559  NA 132* 133*  K 4.2 4.2  CL 97* 98*  CO2 28 30  GLUCOSE 100* 97  BUN 15 6  CREATININE 0.70 0.67  CALCIUM 8.4* 8.7*    CBG (last 3)  No results for input(s): GLUCAP in the last 72 hours. PT/INR:   Recent Labs  12/03/14 1918  LABPROT 16.0*  INR 1.27    CXR:  CHEST 2 VIEW  COMPARISON: 11/23/2014, 05/31/2014 and chest CT 07/09/2014  FINDINGS: Lungs are adequately inflated and demonstrate worsening moderate size left pleural effusion likely with associated atelectasis in the left base. Cannot completely exclude infection in the left base. Evidence of known bilateral calcified pleural  plaques. Cardiomediastinal silhouette is unchanged. There is calcified plaque over the thoracoabdominal aorta. Prosthetic aortic valve unchanged. Remainder of the exam is unchanged.  IMPRESSION: Worsening moderate size left pleural effusion likely with associated atelectasis. Cannot exclude infection in the left base.  Stable asbestos related pleural disease.   Electronically Signed  By: Ronald Bell M.D.  On: 12/03/2014 19:55  Assessment/Plan: S/P Procedure(s) (LRB): COLONOSCOPY (N/A) ENTEROSCOPY (N/A)  Ronald Bell is well known to the multidisciplinary heart valve team having gone through an extensive evaluation and eventually TAVR via transapical approach on 11/20/2014 for severe symptomatic aortic stenosis.  He also has significant multivessel CAD and recently underwent PCI and stenting of the RCA using a bare metal stent on 10/19/2014.  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  At present Ronald Bell reports feeling some better and appears stable.  Hgb appears stable at 7.7 up from 5.1 on admission after receiving 2 units PRBC's.  I would favor transfusing him up to Hgb > 10.    CXR also reveals a moderate sized left pleural effusion.  Given his symptomatic anemia and underlying severe COPD, I think he might benefit from therapeutic thoracentesis.  At this point I think decisions regarding if and when he resumes any type of  anticoagulation therapy should be based upon the presence of severe CAD and recent PCI with stenting.  The thrombogenicity of bioprosthetic tissue valves is fairly low, and I suspect he is more at risk for acute MI than for thromboembolism and stroke.  Fortunately, his PCI was performed using a bare metal stent and he is more than 1 month out.  Will continue to follow with you and check f/u CXR in am.   Unless his left pleural effusion has decreased in size I would proceed with U/S guided thoracentesis while he is here in the hospital.  I spent in excess of 20 minutes during the conduct of this hospital encounter and >50% of this time involved direct face-to-face encounter with the patient for counseling and/or coordination of their care.   Ronald Bell 12/05/2014 5:12 PM

## 2014-12-05 NOTE — Progress Notes (Signed)
Pt down to Endo with transport and tele. NAD, VSS. Pt wished not to take medications until he got back from procedure, afraid he would be n/v. Wife at bedside. Consents signed.

## 2014-12-05 NOTE — Op Note (Signed)
[facility] [place], [address]   COLONOSCOPY PROCEDURE REPORT  PATIENT: Ronald Bell, Ronald Bell  MR#: [id number] BIRTHDATE: 1940-11-14 , 20  yrs. old GENDER: male ENDOSCOPIST: [place] Cellar, MD REFERRED BY: PROCEDURE DATE:  12/05/2014 PROCEDURE:   Colonoscopy with ablation and Colonoscopy with biopsy  ASA CLASS:   Class III INDICATIONS:anemia, non-specific. MEDICATIONS: Per Anesthesia  DESCRIPTION OF PROCEDURE:   After the risks benefits and alternatives of the procedure were thoroughly explained, informed consent was obtained.  The digital rectal exam revealed no abnormalities of the rectum.   The Pentax Adult Colon [id number] endoscope was introduced through the anus and advanced to the terminal ileum which was intubated for a short distance. No adverse events experienced.   The quality of the prep was fair.  The instrument was then slowly withdrawn as the colon was fully examined. Estimated blood loss is zero unless otherwise noted in this procedure report.      COLON FINDINGS: The bowel prep was only fair.  The ileum was intubated and normal.  One small AVM was noted in the ascending colon and ablated using APC catheter with good results.  No other obvious lesions / AVMs were noted in the colon however bowel prep was fair and other small lesions may not have been seen.  One small 3-20mm polyp was seen in the sigmoid colon and removed with cold forceps.  Retroflexed views were normal.  Retroflexed views revealed no abnormalities. The time to cecum = 4.6 Withdrawal time = 15.4   The scope was withdrawn and the procedure completed.  COMPLICATIONS: There were no immediate complications.     ENDOSCOPIC IMPRESSION: Fair bowel preparation Small AVM in the right colon ablated with APC Normal ileum Sigmoid polyp, removed Normal remainder of examined colon  RECOMMENDATIONS: Return to medical ward Clear liquid diet okay Await  pathology results GI service will continue to follow  eSigned:  [place] Cellar, MD 12/05/2014 12:57 PM   cc:   PATIENT NAME:  Ronald Bell, Ronald Bell MR#: [id number]

## 2014-12-05 NOTE — Interval H&P Note (Signed)
History and Physical Interval Note:  12/05/2014 11:25 AM  Ronald Bell  has presented today for surgery, with the diagnosis of anemia, gi bleed, history AVMs  The various methods of treatment have been discussed with the patient and family. After consideration of risks, benefits and other options for treatment, the patient has consented to  Procedure(s): COLONOSCOPY (N/A) ENTEROSCOPY (N/A) as a surgical intervention .  The patient's history has been reviewed, patient examined, no change in status, stable for surgery.  I have reviewed the patient's chart and labs.  Questions were answered to the patient's satisfaction.     Renelda Loma [name]

## 2014-12-05 NOTE — Progress Notes (Signed)
[facility] TEAM 1 - Stepdown/ICU TEAM PROGRESS NOTE  Ronald Bell CBU:[id number] DOB: 1940-12-20 DOA: 12/03/2014 PCP: [name], DO  Admit HPI / Brief Narrative: 74 y.o. male with HX Anxiety, tobacco abuse, HTN, HLD, carotid artery stenosis, CAD, severe AS, PVD, COPD, GI bleed 2010,due to angiodysplasia of the colon who underwent placement of a bare metal stent in the mid RCA 10/19/2014 followed by a transcatheter aortic valve replacement 11/20/2014. He was discharged on 9/6 to [facility] for rehabilitation. He was on ASA and plavix. He developed dark tarry stools approximately 4 days prior to this admission. No N/V. No abdominal pain.  In the ER, his Hgb was found to be 5.1.   HPI/Subjective: The patient is resting comfortably in bed this morning.  He denies chest pain shortness breath fevers chills nausea or vomiting.  He denies abdominal pain.  Assessment/Plan:  ABLA due to GI bleed with angiodysplasia of the colon -EGD and colonoscopy performed 08/17/2014 noted multiple angiodysplastic lesions -GI to perform Enteroscopy and colonoscopy 9/14  -Transfused 2 units of PRBC thus far -Goal hemoglobin is 8.0 or greater - transfuse further as required  CAD - bare metal stent in the mid RCA 10/19/2014 -s/p successful rotablator atherectomy of the mid RCA with bare metal stent placement  -ASA and Plavix held secondary to GI bleed (has completed a full month of treatment since stent placement) -Troponin 1 negative, EKG when compared to EKG from 8/30 no significant change  S/p transcatheter aortic valve replacement (TAVR) -d/c'd 9/6 to rehabilitation facility -No apparent immediate complications - this admission unrelated -TCTS notified of patient's admission as courtesy   Atrial fibrillation -Diagnosed September 2016 around time of TAVR -Maintaining normal sinus rhythm -Continue amiodarone 200 mg BID  Hypertension -Blood pressure medications initially on hold in  setting of hypovolemia due to blood loss - resume in stepwise fashion with party placed on beta blocker   Moderate left pleural effusion / LVH -Does not appear clinically significant at the present time -likely related to recent TAVR (in are of 8th rib [employer] plate fixation) -will discuss further w/ TCTS if progresses to point thoracentesis felt to be indicated   COPD -no active wheezing  Code Status: FULL Family Communication: no family present at time of exam Disposition Plan: SDU  Consultants: [facility]  Procedures: TTE - pending   Antibiotics: None  DVT prophylaxis: SCDs  Objective: Blood pressure 111/40, pulse 84, temperature 97.9 F (36.6 C), temperature source Oral, resp. rate 22, height 5\' 3"  (1.6 m), weight 64.275 kg (141 lb 11.2 oz), SpO2 99 %.  Intake/Output Summary (Last 24 hours) at 12/05/14 0952 Last data filed at 12/05/14 0842  Gross per 24 hour  Intake   2940 ml  Output   2625 ml  Net    315 ml   Exam: General: No acute respiratory distress Lungs: mild exp wheezing th/o - no focal crackles - diminished breath sounds L base  Cardiovascular: Regular rate and rhythm without gallop or rub  Abdomen: Nontender, nondistended, soft, bowel sounds positive, no rebound, no ascites, no appreciable mass Extremities: No significant cyanosis, clubbing, or edema bilateral lower extremities  Data Reviewed: Basic Metabolic Panel:  Recent Labs Lab 12/03/14 2044 12/04/14 0230 12/05/14 0559  NA 131* 132* 133*  K 4.8 4.2 4.2  CL 97* 97* 98*  CO2 27 28 30   GLUCOSE 118* 100* 97  BUN 17 15 6   CREATININE 0.78 0.70 0.67  CALCIUM 8.7* 8.4* 8.7*  MG  --   --  2.0    CBC:  Recent Labs Lab 12/03/14 1918 12/03/14 2310 12/04/14 0230 12/05/14 0559  WBC 15.9*  --  14.8* 12.4*  NEUTROABS 12.7*  --   --  9.9*  HGB 5.1* 7.3* 8.5* 7.9*  HCT 16.1* 22.0* 26.0* 24.0*  MCV 94.2  --  89.0 90.6  PLT 733*  --  582* 588*    Liver Function Tests:  Recent Labs Lab  12/03/14 2044 12/05/14 0559  AST 28 20  ALT 26 23  ALKPHOS 49 54  BILITOT 0.3 0.7  PROT 5.4* 5.9*  ALBUMIN 2.6* 2.6*   Coags:  Recent Labs Lab 12/03/14 1918  INR 1.27    Recent Labs Lab 12/03/14 1918  APTT 29    Cardiac Enzymes:  Recent Labs Lab 12/04/14 1940 12/05/14 0120 12/05/14 0559  TROPONINI 0.04* 0.03 0.03     Studies:   Recent x-ray studies have been reviewed in detail by the Attending Physician  Scheduled Meds:  Scheduled Meds: . amiodarone  200 mg Oral BID PC  . antiseptic oral rinse  7 mL Mouth Rinse BID  . furosemide  60 mg Intravenous BID  . metoprolol tartrate  12.5 mg Oral BID  . pantoprazole  40 mg Oral Daily  . sodium chloride  3 mL Intravenous Q12H    Time spent on care of this patient: 35 mins   MCCLUNG,JEFFREY T , MD   [facility] Office  [phone] Pager - Text Page per Shea Evans as per below:  On-Call/Text Page:      [email or web address]      password [email or web address]  If 7PM-7AM, please contact night-coverage [email or web address] Password [email or web address] 12/05/2014, 9:52 AM   LOS: 2 days

## 2014-12-05 NOTE — H&P (View-Only) (Signed)
[facility] Consult: 11:47 AM 12/04/2014  LOS: 1 day    Referring Provider: Dr Sherral Hammers.  Primary Care Physician:  [name], DO Primary Gastroenterologist:  Dr. Henrene Pastor   Reason for Consultation:  Anemia and FOBT +.    HPI: Ronald Bell is a 74 y.o. male. Hx severe Ao stenosis.  S/p cardiac stent 09/2014. On chronic Plavix, ASA.  8/30 - 11/27/14 admission with symptomatic COPD and Ao stenosis and proceeded to TAVR.  Transfused one PRBC on 11/20/14.   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 Transfused one unit PRBC 08/16/14. 08/17/14  Colonoscopy for melena and anemia and need for Plavix.  Dr Hilarie Fredrickson ablated AVMs in cecum and ascending colon.  2 small polyps at descending and rectosigmoid were not removed or biopsied.  08/17/14  EGD for same.  Ablated 3 gastric, 4 duodenal, 6 jejunal AVms.  10/2013  Colonoscopy for personal hx adenomatous polyps piecemeal extracted in 2012 and 2013.  Dr Henrene Pastor removed 3 diminutive polyps (HP and tubular adenomas). Cecal AVM noted (not ablated).  Sigmoid tics.    Admitted with symptomatic anemia, Hgb 5.1 (10.2 on 9/3). MCV normal at 89.  S/p PRBCs x 2.   Stools have been dark, formed x 4 days.  No nausea, abd pain, BPR, dysphagia. Good appetite BUN normal.   Overall says his breathing is worse since TAVR, now needing home oxygen, not the case before surgery.  Has upcoming OV with Dr Roxy Manns on Monday 9/19  Past Medical History  Diagnosis Date  . Polyp of nasal cavity   . Rosacea   . Hx of adenomatous colonic polyps 2012, 2013.   . Tobacco abuse   . Hypertension   . Hyperlipidemia   . GERD (gastroesophageal reflux disease)   . GI bleed 2010    4 units PRBCs  . Peripheral vascular disease   . Irregular heartbeat   . Shingles   . Carotid  artery occlusion   . Angiodysplasia of intestine with hemorrhage     large and SB, gastric AVMs.   . Pleural plaque with presence of asbestos 03/27/2013    Followed in Pulmonary clinic/ [facility]/ [facility] - F/u CT 09/08/2013 1. Stable extensive calcified pleural plaque formation consistent with asbestos exposure. 2. Multiple pulmonary nodules are unchanged from the CT of 6 months ago. Given risk factors for lung cancer, continued follow up is recommended with chest CT in 6 months> done 04/20/14 no change >repeat in 12 m in tickle file     . GERD 11/30/2008    Qualifier: Diagnosis of  By: Marijean Niemann CMA, Danielle    . PVD (peripheral vascular disease) 10/18/2012  . Carotid artery stenosis 04/22/2012  . Coronary artery disease   . COPD GOLD III with min reversibilty  08/11/2006    Followed in Pulmonary clinic/ [facility]/ [facility] - PFT's 04/28/2013  FEV1 0.88 (40%) with ratio 44 and 14% better p B2 dlco 45 corrects to 83 - Trial of breo 04/28/2013 > improved symptoms  06/09/2013  -  spirometry 06/04/2014 FEV1  0.76 (29%) ratio 45      . Iron deficiency anemia 01/25/2009    Qualifier: Diagnosis of  By: Henrene Pastor MD, Docia Chuck   . History of blood transfusion "couple times"    "related to bleeding in colon and esophagus"  . Arthritis     "left shoulder" (10/19/2014)  . Anxiety   . 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 Iliac artery stent Left 2005    CIA  . Esophagogastroduodenoscopy  2012    normal  . Colonoscopy  July 2015    Dr. Henrene Pastor  . Tonsillectomy    . Knee arthroscopy with medial menisectomy Left 03/08/2014    Procedure: LEFT KNEE SCOPE WITH MEDIAL MENISECTOMY AND CHONDROPLASTY;  Surgeon: Ninetta Lights, MD;  Location: [place];  Service: Orthopedics;  Laterality: Left;  . Left and right heart catheterization with coronary angiogram N/A  06/28/2014    Procedure: LEFT AND RIGHT HEART CATHETERIZATION WITH CORONARY ANGIOGRAM;  Surgeon: Jerline Pain, MD;  Location: [facility] CATH LAB;  Service: Cardiovascular;  Laterality: N/A;  . Esophagogastroduodenoscopy N/A 08/17/2014    Procedure: ESOPHAGOGASTRODUODENOSCOPY (EGD);  Surgeon: Jerene Bears, MD;  Location: [facility] ENDOSCOPY;  Service: Endoscopy;  Laterality: N/A;  . Colonoscopy N/A 08/17/2014    Procedure: COLONOSCOPY;  Surgeon: Jerene Bears, MD;  Location[facility] ENDOSCOPY;  Service: Endoscopy;  Laterality: N/A;  . Cardiac catheterization  2001; 06/28/2014  . Cardiac catheterization N/A 10/19/2014    Procedure: Coronary Stent Intervention;  Surgeon: Burnell Blanks, MD;  Location: [place] CV LAB;  Service: Cardiovascular;  Laterality: N/A;  BMS Mid RCA  . Transcatheter aortic valve replacement, transapical N/A 11/20/2014    Procedure: TRANSCATHETER AORTIC VALVE REPLACEMENT, TRANSAPICAL;  Surgeon: [name], MD;  Location: [place];  Service: Open Heart Surgery;  Laterality: N/A;  . Tee without cardioversion N/A 11/20/2014    Procedure: TRANSESOPHAGEAL ECHOCARDIOGRAM (TEE);  Surgeon: [name], MD;  Location: [place];  Service: Open Heart Surgery;  Laterality: N/A;  . Rib plating Left 11/20/2014    Procedure: RIB PLATING OF LEFT 8TH RIB;  Surgeon: [name], MD;  Location: [place];  Service: Open Heart Surgery;  Laterality: Left;    Prior to Admission medications   Medication Sig Start Date End Date Taking? Authorizing Provider  amiodarone (PACERONE) 200 MG tablet Take 1 tablet (200 mg total) by mouth 2 (two) times daily after a meal. 11/27/14  Yes Wayne E Gold, PA-C  amLODipine (NORVASC) 10 MG tablet Take 1 tablet (10 mg total) by mouth daily. 10/03/14  Yes Rosalita Chessman, DO  Ascorbic Acid (VITAMIN C) 250 MG tablet Take 500 mg by mouth daily.    Yes Historical Provider, MD  aspirin 81 MG chewable tablet Chew 1 tablet (81 mg total) by mouth daily. 10/20/14  Yes Brett Canales, PA-C    atorvastatin (LIPITOR) 40 MG tablet TAKE ONE TABLET BY MOUTH ONCE DAILY Patient taking differently: TAKE 40 MG BY MOUTH ONCE DAILY 10/17/14  Yes Yvonne R Lowne, DO  BREO ELLIPTA 100-25 MCG/INH AEPB INHALE ONE PUFF INTO LUNGS ONCE DAILY IN THE MORNING 09/26/14  Yes Tanda Rockers, MD  clopidogrel (PLAVIX) 75 MG tablet Take 1 tablet (75 mg total) by mouth daily with breakfast. 10/20/14  Yes Brett Canales, PA-C  docusate sodium (COLACE) 100 MG capsule Take 100 mg by mouth 2 (two) times daily.    Yes Historical Provider, MD  fenofibrate 160 MG tablet Take 1 tablet (160 mg total) by mouth daily. 11/13/14  Yes Yvonne R Lowne, DO  Ipratropium-Albuterol (COMBIVENT RESPIMAT) 20-100 MCG/ACT AERS respimat Inhale 1 puff into the lungs 4 (four) times daily as needed for wheezing. 05/10/14  Yes Alferd Apa Lowne, DO  metoprolol tartrate (LOPRESSOR) 25 MG tablet Take 0.5 tablets (12.5 mg total) by mouth 2 (two) times daily. 08/18/14  Yes Charlynne Cousins, MD  Multiple Vitamins-Minerals (CENTRUM SILVER PO) Take 1 tablet by mouth daily.    Yes Historical Provider, MD  oxyCODONE (OXY IR/ROXICODONE) 5 MG immediate release tablet Take 1-2 tablets (5-10 mg total) by mouth every 6 (six) hours as needed for severe pain. 11/27/14  Yes Wayne E Gold, PA-C  pantoprazole (PROTONIX) 40 MG tablet TAKE ONE TABLET BY MOUTH ONCE DAILY Patient taking differently: Take 40 mg by mouth daily.  09/04/14  Yes Yvonne R Lowne, DO  valsartan-hydrochlorothiazide (DIOVAN HCT) 160-12.5 MG per tablet Take 1 tablet by mouth daily. 09/04/14  Yes Rosalita Chessman, DO    Scheduled Meds: . amiodarone  200 mg Oral BID PC  . antiseptic oral rinse  7 mL Mouth Rinse BID  . metoprolol tartrate  12.5 mg Oral BID  . sodium chloride  3 mL Intravenous Q12H   Infusions: . pantoprozole (PROTONIX) infusion 8 mg/hr (12/04/14 0543)   PRN Meds: acetaminophen **OR** acetaminophen, ipratropium-albuterol, LORazepam   Allergies as of 12/03/2014  . (No Known  Allergies)    Family History  Problem Relation Age of Onset  . Lung disease Mother     pulm fibrosis  . Uterine cancer    . Colitis Father   . Colon cancer Neg Hx   . Heart disease Brother   . Hypertension Brother   . Hyperlipidemia Brother   . Heart disease Daughter     cad  . Hypertension Son     Social History   Social History  . Marital Status: Married    Spouse Name: N/A  . Number of Children: 4  . Years of Education: N/A   Occupational History  . Charity fundraiser    Social History Main Topics  . Smoking status: Former Smoker -- 1.00 packs/day for 60 years    Types: Cigarettes, E-cigarettes    Quit date: 04/17/2014  . Smokeless tobacco: Never Used  . Alcohol Use: 8.4 oz/week    14 Glasses of wine per week  . Drug Use: No  . Sexual Activity: No   Other Topics Concern  . Not on file   Social History Narrative    REVIEW OF SYSTEMS: Constitutional:  Feels tired and week ENT:  No nose bleeds Pulm:  SOB and prductive cough.  Requiring home O2.  CV:  No palpitations, no LE edema. No chest pain.  GU:  No hematuria, no frequency GI:  Per HPI Heme:  Per HPI   Transfusions:  Per HPI Neuro:  No headaches, no peripheral tingling or numbness Derm:  No itching, no rash or sores.  Endocrine:  No sweats or chills.  No polyuria or dysuria Immunization:  reviewed Travel:  None beyond local counties in last few months.    PHYSICAL EXAM: Vital signs in last 24 hours: Filed Vitals:   12/04/14 0917  BP: 132/46  Pulse: 88  Temp:   Resp: 25   Wt Readings from Last 3  Encounters:  12/03/14 142 lb 4.8 oz (64.547 kg)  11/30/14 145 lb 12.8 oz (66.134 kg)  11/28/14 145 lb 11.2 oz (66.089 kg)    General: ill looking WM who is pale.  comfortable Head:  No swelling or asymmetry  Eyes:  No icterus, conjunctiva pale Ears:  Slightly HOH  Nose:  No congestion or discharge Mouth:  Clear and moist MM Neck:  No mass, no TMG, no JVD Lungs:  Diminished but clear. +  cough.  Heart: RRR, no mrg, s2/s2 audible Abdomen:  Soft, NT, ND.  No HSM or mass.   Rectal: deferred   Musc/Skeltl: no joint swelling or redness.  kyphosis Extremities:  No CCE.    Neurologic:  Oriented x 3.  No tremor, no gross deficits.  Moves all 4 limbs, strength not tested Skin:  No rash.  Multiple solar/aktinic keratosis Tattoos:  none Nodes:  No inguinal adenopathy   Psych:  Cooperative, pleasant, relaxed.    Intake/Output from previous day: 09/12 0701 - 09/13 0700 In: 1343.4 [P.O.:150; I.V.:500.4; Blood:693] Out: 700 [Urine:700] Intake/Output this shift: Total I/O In: -  Out: 201 [Urine:200; Stool:1]  LAB RESULTS:  Recent Labs  12/03/14 1918 12/03/14 2310 12/04/14 0230  WBC 15.9*  --  14.8*  HGB 5.1* 7.3* 8.5*  HCT 16.1* 22.0* 26.0*  PLT 733*  --  582*   BMET Lab Results  Component Value Date   NA 132* 12/04/2014   NA 131* 12/03/2014   NA 133* 11/24/2014   K 4.2 12/04/2014   K 4.8 12/03/2014   K 4.1 11/24/2014   CL 97* 12/04/2014   CL 97* 12/03/2014   CL 98* 11/24/2014   CO2 28 12/04/2014   CO2 27 12/03/2014   CO2 28 11/24/2014   GLUCOSE 100* 12/04/2014   GLUCOSE 118* 12/03/2014   GLUCOSE 105* 11/24/2014   BUN 15 12/04/2014   BUN 17 12/03/2014   BUN 16 11/24/2014   CREATININE 0.70 12/04/2014   CREATININE 0.78 12/03/2014   CREATININE 0.73 11/24/2014   CALCIUM 8.4* 12/04/2014   CALCIUM 8.7* 12/03/2014   CALCIUM 8.3* 11/24/2014   LFT  Recent Labs  12/03/14 2044  PROT 5.4*  ALBUMIN 2.6*  AST 28  ALT 26  ALKPHOS 49  BILITOT 0.3    RADIOLOGY STUDIES: Dg Chest 2 View  12/03/2014   CLINICAL DATA:  Dark stools onset 4 days ago. Shortness of breath and hypoxia. Aortic valve replacement 11/20/2014.  EXAM: CHEST  2 VIEW  COMPARISON:  11/23/2014, 05/31/2014 and chest CT 07/09/2014  FINDINGS: Lungs are adequately inflated and demonstrate worsening moderate size left pleural effusion likely with associated atelectasis in the left base. Cannot  completely exclude infection in the left base. Evidence of known bilateral calcified pleural plaques. Cardiomediastinal silhouette is unchanged. There is calcified plaque over the thoracoabdominal aorta. Prosthetic aortic valve unchanged. Remainder of the exam is unchanged.  IMPRESSION: Worsening moderate size left pleural effusion likely with associated atelectasis. Cannot exclude infection in the left base.  Stable asbestos related pleural disease.   Electronically Signed   By: Marin Olp M.D.   On: 12/03/2014 19:55    ENDOSCOPIC STUDIES: As per HPI  IMPRESSION:   *  Recurrent anemia and GI bleeding.  Likely from AVMs in setting of Plavix and ASA.  *  S/p TAVR 10/2014. Cardiac stent/rotoblator 09/2013.  ASA and plavix on hold   *  Left pleural effusion.    *  Hx adenomatous colon polyps.     PLAN:     *  Enteroscopy and colonoscopy tomorrow.  Golytely prep. Clears today.   *  stopped the PPI drip, this is not an ulcer.  AVM bleeds not responsive to PPI.  He takes daily Protonix at home, restarted this.  *  CBC in AM    Azucena Freed  12/04/2014, 11:47 AM Pager: [phone]     Attending Addendum: I have taken an interval history, reviewed the chart, and examined the patient. I agree with the Advanced Practitioner's note and impression. Patient well known to our GI service. He has had multiple bleeds in the past, from both colonic and gastric/small bowel AVMs. Now s/p aortic valve replacement and cardiac sent in July in need of plavix use. Readmitted with GI bleeding. Suspect he has bled from upper tract AVMs however right colon is also possible given he had this treated on his last exam as well. Given his need for antiplatelet therapy moving forward, recommend both EGD and colonoscopy to ablate lesions noted to minimize his risk for future bleeds. Depending on the AVM burden, we could consider monthly octreotide depot injections to reduce the risk of AVM bleeding, if cleared by  cardiology given some of its side effects. Otherwise, clinically stable at this time, plan for prep today with procedures tomorrow. Call if changes in his status in the interim.    Cellar, MD [facility] Pager [phone]

## 2014-12-06 ENCOUNTER — Inpatient Hospital Stay (HOSPITAL_COMMUNITY): Payer: Medicare Other

## 2014-12-06 ENCOUNTER — Encounter (HOSPITAL_COMMUNITY): Payer: Self-pay | Admitting: Gastroenterology

## 2014-12-06 DIAGNOSIS — J90 Pleural effusion, not elsewhere classified: Secondary | ICD-10-CM | POA: Diagnosis present

## 2014-12-06 DIAGNOSIS — I509 Heart failure, unspecified: Secondary | ICD-10-CM

## 2014-12-06 DIAGNOSIS — I517 Cardiomegaly: Secondary | ICD-10-CM | POA: Diagnosis present

## 2014-12-06 LAB — CBC
HCT: 29.5 % — ABNORMAL LOW (ref 39.0–52.0)
HEMATOCRIT: 31.6 % — AB (ref 39.0–52.0)
HEMOGLOBIN: 10.5 g/dL — AB (ref 13.0–17.0)
Hemoglobin: 9.5 g/dL — ABNORMAL LOW (ref 13.0–17.0)
MCH: 28.2 pg (ref 26.0–34.0)
MCH: 29.2 pg (ref 26.0–34.0)
MCHC: 32.2 g/dL (ref 30.0–36.0)
MCHC: 33.2 g/dL (ref 30.0–36.0)
MCV: 87.5 fL (ref 78.0–100.0)
MCV: 88 fL (ref 78.0–100.0)
PLATELETS: 535 10*3/uL — AB (ref 150–400)
Platelets: 589 10*3/uL — ABNORMAL HIGH (ref 150–400)
RBC: 3.37 MIL/uL — AB (ref 4.22–5.81)
RBC: 3.59 MIL/uL — ABNORMAL LOW (ref 4.22–5.81)
RDW: 17.3 % — ABNORMAL HIGH (ref 11.5–15.5)
RDW: 17.5 % — ABNORMAL HIGH (ref 11.5–15.5)
WBC: 13.6 10*3/uL — AB (ref 4.0–10.5)
WBC: 14.2 10*3/uL — ABNORMAL HIGH (ref 4.0–10.5)

## 2014-12-06 LAB — COMPREHENSIVE METABOLIC PANEL
ALBUMIN: 2.6 g/dL — AB (ref 3.5–5.0)
ALK PHOS: 59 U/L (ref 38–126)
ALT: 22 U/L (ref 17–63)
ANION GAP: 8 (ref 5–15)
AST: 23 U/L (ref 15–41)
BUN: 14 mg/dL (ref 6–20)
CHLORIDE: 95 mmol/L — AB (ref 101–111)
CO2: 28 mmol/L (ref 22–32)
Calcium: 8.5 mg/dL — ABNORMAL LOW (ref 8.9–10.3)
Creatinine, Ser: 0.66 mg/dL (ref 0.61–1.24)
GFR calc Af Amer: >60 mL/min (ref >60)
GFR calc non Af Amer: >60 mL/min (ref >60)
GLUCOSE: 107 mg/dL — AB (ref 65–99)
POTASSIUM: 4.1 mmol/L (ref 3.5–5.1)
SODIUM: 131 mmol/L — AB (ref 135–145)
Total Bilirubin: 1.2 mg/dL (ref 0.3–1.2)
Total Protein: 5.6 g/dL — ABNORMAL LOW (ref 6.5–8.1)

## 2014-12-06 NOTE — Progress Notes (Signed)
  Echocardiogram 2D Echocardiogram has been performed.  Darlina Sicilian M 12/06/2014, 9:25 AM

## 2014-12-06 NOTE — Care Management Important Message (Signed)
Important Message  Patient Details  Name: Ronald Bell MRN: [id number] Date of Birth: 07-08-1940   Medicare Important Message Given:  Yes-second notification given    Delorse Lek 12/06/2014, 8:39 AM

## 2014-12-06 NOTE — Progress Notes (Signed)
Daily Rounding Note  12/06/2014, 9:04 AM  LOS: 3 days   SUBJECTIVE:       No stools since the bowel prep.  Feels better, less coughing, but still very dyspneic.  Nose/sinuses feels congested but no drainage.   OBJECTIVE:         Vital signs in last 24 hours:    Temp:  [97.2 F (36.2 C)-98.1 F (36.7 C)] 97.6 F (36.4 C) (09/15 0802) Pulse Rate:  [53-87] 87 (09/15 0802) Resp:  [16-28] 22 (09/15 0802) BP: (99-142)/(10-82) 113/64 mmHg (09/15 0802) SpO2:  [90 %-100 %] 91 % (09/15 0802) Weight:  [141 lb 1.5 oz (64 kg)] 141 lb 1.5 oz (64 kg) (09/15 0500) Last BM Date: 12/05/14 Filed Weights   12/03/14 2309 12/05/14 0500 12/06/14 0500  Weight: 142 lb 4.8 oz (64.547 kg) 141 lb 11.2 oz (64.275 kg) 141 lb 1.5 oz (64 kg)   General: frail, alert.  Looks chronically ill but is aleert.   Heart: Regular, paced Chest: diminished BS bil.  Mild to mod dyspnea with speech.  Nasal oxygen in place Abdomen: soft, NT, ND.  Active BS  Extremities: no CCE Neuro/Psych:  Pleasant, alert, calm.  No gross deficits.   Intake/Output from previous day: 09/14 0701 - 09/15 0700 In: 2020 [P.O.:1100; I.V.:250; Blood:670] Out: 4000 [Urine:3600; Stool:400]  Intake/Output this shift:    Lab Results:  Recent Labs  12/05/14 0559 12/05/14 1356 12/06/14 0728  WBC 12.4* 16.4* 13.6*  HGB 7.9* 7.7* 9.5*  HCT 24.0* 23.5* 29.5*  PLT 588* 595* 535*   BMET  Recent Labs  12/04/14 0230 12/05/14 0559 12/06/14 0240  NA 132* 133* 131*  K 4.2 4.2 4.1  CL 97* 98* 95*  CO2 28 30 28   GLUCOSE 100* 97 107*  BUN 15 6 14   CREATININE 0.70 0.67 0.66  CALCIUM 8.4* 8.7* 8.5*   LFT  Recent Labs  12/03/14 2044 12/05/14 0559 12/06/14 0240  PROT 5.4* 5.9* 5.6*  ALBUMIN 2.6* 2.6* 2.6*  AST 28 20 23   ALT 26 23 22   ALKPHOS 49 54 59  BILITOT 0.3 0.7 1.2   PT/INR  Recent Labs  12/03/14 1918  LABPROT 16.0*  INR 1.27   Hepatitis Panel No  results for input(s): HEPBSAG, HCVAB, HEPAIGM, HEPBIGM in the last 72 hours.  Studies/Results: Dg Chest Port 1 View  12/06/2014   CLINICAL DATA:  History of pleural effusion.  EXAM: PORTABLE CHEST - 1 VIEW  COMPARISON:  11/22/2014  FINDINGS: Prosthetic aortic valve is stable in appearance.  Cardiomediastinal silhouette is partially obscured by a large left pleural effusion. Mediastinal contours appear intact. Atherosclerotic disease of the aorta is seen.  There is no evidence of pneumothorax. The left-sided pleural effusion has apparently increased in size. Left lower lobe airspace consolidation is likely present. Calcified pleural plaques are again seen.  Osseous structures are without acute abnormality. Soft tissues are grossly normal.  IMPRESSION: Enlarging left pleural effusion, possibly loculated. There is likely left lower lung field airspace consolidation.  Stable appearance of calcified pleural plaques.   Electronically Signed   By: Fidela Salisbury M.D.   On: 12/06/2014 07:53   Scheduled Meds: . amiodarone  200 mg Oral BID PC  . antiseptic oral rinse  7 mL Mouth Rinse BID  . Fluticasone Furoate-Vilanterol  100 mcg Inhalation Daily  . furosemide  60 mg Intravenous BID  . metoprolol tartrate  12.5 mg Oral BID  . pantoprazole  40 mg Oral Daily   Continuous Infusions:  PRN Meds:.acetaminophen **OR** acetaminophen, ipratropium-albuterol, LORazepam, sodium chloride, sodium chloride   ASSESMENT:   * Recurrent anemia and GI bleeding in setting Plavix Colonoscopy 9/14: ablation small right colon AVM.  Enteroscopy 9/14: ablation of several AVMs in jejunum, duodenum, stomach.  Hgb improved. S/p 4 PRBCs thus far.  On once daily Protonix.   * S/p TAVR 10/2014. Cardiac bare metal stent/rotoblator 09/2013 still has multilevel disease. ASA and plavix on hold   * Left pleural effusion, enlarging. per Dr Roxy Manns, may benefit from thoracentesis.   * Hx adenomatous colon polyps.     PLAN    *  Follow CBC.  ? When to restart Plavix.  *  Advance to [facility] diet.  *  Will sign off, GI available PRN.     Ronald Bell  12/06/2014, 9:04 AM Pager: [phone]  Attending Addendum: I have taken an interval history, reviewed the chart, and examined the patient. I agree with the Advanced Practitioner's note and impression. Patient had multiple AVMs cauterized yesterday. Still possible he has other small bowel AVMs however his H/H is stable. Tolerating diet. Monitor serial H/H but if no evidence of active bleeding at this time, okay to resume plavix. If he has further bleeding while resuming this please contact us. Further, if cleared from cardiac perspective may consider octreotide depot for which there is evidence to reduce the risk of rebleeding from AVMs but has some significant cardiac side effects. If cleared from cardiology regarding this, I would recommend a trial of it to reduce the risk of rebleeding. Please call with questions / concerns.   [place] Cellar, MD [facility] Pager [phone]

## 2014-12-06 NOTE — Progress Notes (Signed)
[place] TEAM 1 - Stepdown/ICU TEAM Progress Note  Ronald Bell WHQ:[id number] DOB: 26-Jul-1940 DOA: 12/03/2014 PCP: [name], DO  Admit HPI / Brief Narrative: 74 y.o. male with PMHX Anxiety, tobacco abuse, HTN, HLD, irregular heartbeat, carotid artery stenosis, CAD native artery, heart murmur, severe AS, PVD, emphysema/COPD, GI bleed 2010,  and angiodysplasia of the colon  Who recently (July) had a stent placed, he then had TAVR. He was discharged on 9/6 to [facility]. He was on ASA and plavix. He then 4 days ago developed dark tarry stools. No N/V. No abdominal pain. Has had GI bleeding in the past that required ligation. He is more SOB since surgery as well. He was doing well otherwise in PT and was due to be released soon from [facility] (Per patient)  In the ER, his Hgb was found to be 5.1. He was started transfusions of 2 units. GI was consulted and will see in AM- [place], and he was started on protonix gtt. hospitalist were asked to admit   HPI/Subjective: 9/13 A/O 4, chronic SOB.   Assessment/Plan: ABLA due to GI bleed with angiodysplasia of the colon -EGD and colonoscopy performed on 08/17/2014. Showed multiple angiodysplastic lesions. -9/14 S/P EGD; joint numerous areas of bleeding see results below  -9/12 Transfused 2 units of PRBC  -protonix 40 mg daily per GI   CAD in native artery with Recent stent -09/2014: Severe mid RCA stenosis now s/p successful rotablator atherectomy of the mid RCA with bare metal stent placement.  -ASA and Plavix  held secondary to GI bleed. -Troponin 1 negative, EKG when compared to EKG from 8/30 no significant change, would consult cardiology if significant positive troponins.   S/p transcatheter aortic valve replacement (TAVR) -d/c'd 9/6 to [facility] -would notifiy CVTS in AM of admission  Irregular heartbeat  -Continue amiodarone 200 mg BID  Hypertension -Hold BP medication  Moderate left pleural  effusion/LVH -Obtain echocardiogram; if valve shows any signs of failure or significant change in structural function consult cardiology. -Strict in and out -Daily weight standing; -Post colonoscopy, start Lasix 60 mg BID, unless otherwise contraindicated -PCXR Thursday morning, if no improvement in pleural effusion will perform thoracentesis  Acute resp failure -Possibly multifactorial to include moderate pleural effusion on the left, increasing heart failure?  Titrate O2 to maintain SPO2> 89 -93%  -COPD - no active wheezing      Code Status: FULL Family Communication: no family present at time of exam Disposition Plan: Completion of GI bleed workup    Consultants: Dr.Steven Gaye Alken, (GI)   Procedure/Significant Events: 8/31 echocardiogram;Left ventricle: mild LVH. -LVEF= 55% to 60%.-(grade 1 diastolic dysfunction). 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, duodenum, stomach   Culture   Antibiotics:  N/A  DVT prophylaxis:  SCD   Devices    LINES / TUBES:      Continuous Infusions:    Objective: VITAL SIGNS: Temp: 97.8 F (36.6 C) (09/15 1136) Temp Source: Oral (09/15 1136) BP: 113/39 mmHg (09/15 1136) Pulse Rate: 78 (09/15 1136) SPO2; FIO2:   Intake/Output Summary (Last 24 hours) at 12/06/14 1459 Last data filed at 12/06/14 1100  Gross per 24 hour  Intake   2020 ml  Output   3800 ml  Net  -1780 ml     Exam: General:  A/O 4, NAD, acute  respiratory distress Eyes: Negative headache, negative scleral hemorrhage ENT: Negative  Runny nose, negative ear pain, negative gingival bleeding, Neck:  Negative scars, masses, torticollis, lymphadenopathy, JVD Lungs:  lingula/LLL no breath sounds appreciated, crackles RLL, poor air movement throughout. Negative  wheezes Cardiovascular: Regular rate and rhythm without murmur gallop  or rub normal S1 and S2 Abdomen:negative abdominal pain, nondistended, positive soft, bowel sounds, no rebound, no ascites, no appreciable mass Extremities: No significant cyanosis, clubbing, or edema bilateral lower extremities Psychiatric:  Negative depression, negative anxiety, negative fatigue, negative mania Neurologic:  Cranial nerves II through XII intact, tongue/uvula midline, all extremities muscle strength 5/5, sensation intact throughout, negative dysarthria, negative expressive aphasia, negative receptive aphasia.   Data Reviewed: Basic Metabolic Panel:  Recent Labs Lab 12/03/14 2044 12/04/14 0230 12/05/14 0559 12/06/14 0240  NA 131* 132* 133* 131*  K 4.8 4.2 4.2 4.1  CL 97* 97* 98* 95*  CO2 27 28 30 28   GLUCOSE 118* 100* 97 107*  BUN 17 15 6 14   CREATININE 0.78 0.70 0.67 0.66  CALCIUM 8.7* 8.4* 8.7* 8.5*  MG  --   --  2.0  --    Liver Function Tests:  Recent Labs Lab 12/03/14 2044 12/05/14 0559 12/06/14 0240  AST 28 20 23   ALT 26 23 22   ALKPHOS 49 54 59  BILITOT 0.3 0.7 1.2  PROT 5.4* 5.9* 5.6*  ALBUMIN 2.6* 2.6* 2.6*   No results for input(s): LIPASE, AMYLASE in the last 168 hours. No results for input(s): AMMONIA in the last 168 hours. CBC:  Recent Labs Lab 12/03/14 1918  12/04/14 0230 12/05/14 0559 12/05/14 1356 12/06/14 0728 12/06/14 1345  WBC 15.9*  --  14.8* 12.4* 16.4* 13.6* 14.2*  NEUTROABS 12.7*  --   --  9.9*  --   --   --   HGB 5.1*  < > 8.5* 7.9* 7.7* 9.5* 10.5*  HCT 16.1*  < > 26.0* 24.0* 23.5* 29.5* 31.6*  MCV 94.2  --  89.0 90.6 91.4 87.5 88.0  PLT 733*  --  582* 588* 595* 535* 589*  < > = values in this interval not displayed. Cardiac Enzymes:  Recent Labs Lab 12/04/14 1940 12/05/14 0120 12/05/14 0559  TROPONINI 0.04* 0.03 0.03   BNP (last 3 results)  Recent Labs  05/31/14 2351 12/03/14 1919  BNP 75.7 59.7    ProBNP (last 3 results) No results for input(s): PROBNP in the last 8760 hours.  CBG: No results for  input(s): GLUCAP in the last 168 hours.  No results found for this or any previous visit (from the past 240 hour(s)).   Studies:  Recent x-ray studies have been reviewed in detail by the Attending Physician  Scheduled Meds:  Scheduled Meds: . amiodarone  200 mg Oral BID PC  . antiseptic oral rinse  7 mL Mouth Rinse BID  . Fluticasone Furoate-Vilanterol  100 mcg Inhalation Daily  . furosemide  60 mg Intravenous BID  . metoprolol tartrate  12.5 mg Oral BID  . pantoprazole  40 mg Oral Daily    Time spent on care of this patient: 40 mins   WOODS, Geraldo Docker , MD  [facility] Office  [phone] Pager - [phone]  On-Call/Text Page:      [email or web address]      password [email or web address]  If 7PM-7AM, please contact night-coverage [email or web address] Password [email or web address] 12/06/2014, 2:59 PM   LOS: 3 days   Care during the described time interval was provided by me .  I have reviewed this patient's available data, including medical history, events of note,  physical examination, and all test results as part of my evaluation. I have personally reviewed and interpreted all radiology studies.   Dia Crawford, MD [phone] Pager

## 2014-12-06 NOTE — Progress Notes (Signed)
Patient is a resident of [place].  SW Psychosocial Assessment to follow.  Ronald Bell. Ronald Bell, [place]

## 2014-12-07 ENCOUNTER — Inpatient Hospital Stay (HOSPITAL_COMMUNITY): Payer: Medicare Other

## 2014-12-07 DIAGNOSIS — J9601 Acute respiratory failure with hypoxia: Secondary | ICD-10-CM

## 2014-12-07 DIAGNOSIS — I517 Cardiomegaly: Secondary | ICD-10-CM

## 2014-12-07 DIAGNOSIS — J948 Other specified pleural conditions: Secondary | ICD-10-CM

## 2014-12-07 DIAGNOSIS — Z954 Presence of other heart-valve replacement: Secondary | ICD-10-CM

## 2014-12-07 DIAGNOSIS — K922 Gastrointestinal hemorrhage, unspecified: Secondary | ICD-10-CM

## 2014-12-07 DIAGNOSIS — K254 Chronic or unspecified gastric ulcer with hemorrhage: Secondary | ICD-10-CM

## 2014-12-07 LAB — CBC WITH DIFFERENTIAL/PLATELET
BASOS PCT: 1 %
Basophils Absolute: 0.1 10*3/uL (ref 0.0–0.1)
EOS ABS: 0.3 10*3/uL (ref 0.0–0.7)
EOS PCT: 3 %
HCT: 28.4 % — ABNORMAL LOW (ref 39.0–52.0)
HEMOGLOBIN: 9.4 g/dL — AB (ref 13.0–17.0)
Lymphocytes Relative: 5 %
Lymphs Abs: 0.5 10*3/uL — ABNORMAL LOW (ref 0.7–4.0)
MCH: 29.4 pg (ref 26.0–34.0)
MCHC: 33.1 g/dL (ref 30.0–36.0)
MCV: 88.8 fL (ref 78.0–100.0)
MONOS PCT: 14 %
Monocytes Absolute: 1.5 10*3/uL — ABNORMAL HIGH (ref 0.1–1.0)
NEUTROS PCT: 77 %
Neutro Abs: 8.6 10*3/uL — ABNORMAL HIGH (ref 1.7–7.7)
PLATELETS: 485 10*3/uL — AB (ref 150–400)
RBC: 3.2 MIL/uL — ABNORMAL LOW (ref 4.22–5.81)
RDW: 17.3 % — AB (ref 11.5–15.5)
WBC: 11 10*3/uL — ABNORMAL HIGH (ref 4.0–10.5)

## 2014-12-07 LAB — COMPREHENSIVE METABOLIC PANEL
ALBUMIN: 2.6 g/dL — AB (ref 3.5–5.0)
ALK PHOS: 56 U/L (ref 38–126)
ALT: 19 U/L (ref 17–63)
ANION GAP: 6 (ref 5–15)
AST: 18 U/L (ref 15–41)
BILIRUBIN TOTAL: 0.5 mg/dL (ref 0.3–1.2)
BUN: 16 mg/dL (ref 6–20)
CALCIUM: 8.4 mg/dL — AB (ref 8.9–10.3)
CO2: 34 mmol/L — ABNORMAL HIGH (ref 22–32)
CREATININE: 0.83 mg/dL (ref 0.61–1.24)
Chloride: 94 mmol/L — ABNORMAL LOW (ref 101–111)
GFR calc non Af Amer: >60 mL/min (ref >60)
GLUCOSE: 103 mg/dL — AB (ref 65–99)
Potassium: 3.4 mmol/L — ABNORMAL LOW (ref 3.5–5.1)
Sodium: 134 mmol/L — ABNORMAL LOW (ref 135–145)
TOTAL PROTEIN: 5.8 g/dL — AB (ref 6.5–8.1)

## 2014-12-07 LAB — TYPE AND SCREEN
ABO/RH(D): A POS
ANTIBODY SCREEN: NEGATIVE
UNIT DIVISION: 0
UNIT DIVISION: 0
UNIT DIVISION: 0
UNIT DIVISION: 0
UNIT DIVISION: 0
Unit division: 0

## 2014-12-07 LAB — GRAM STAIN

## 2014-12-07 LAB — BODY FLUID CELL COUNT WITH DIFFERENTIAL
Eos, Fluid: 1 %
LYMPHS FL: 59 %
Monocyte-Macrophage-Serous Fluid: 14 % — ABNORMAL LOW (ref 50–90)
NEUTROPHIL FLUID: 26 % — AB (ref 0–25)
WBC FLUID: 1153 uL — AB (ref 0–1000)

## 2014-12-07 LAB — LACTATE DEHYDROGENASE, PLEURAL OR PERITONEAL FLUID: LD FL: 298 U/L — AB (ref 3–23)

## 2014-12-07 LAB — PROTEIN, BODY FLUID: Total protein, fluid: 3.4 g/dL

## 2014-12-07 LAB — MAGNESIUM: Magnesium: 1.8 mg/dL (ref 1.7–2.4)

## 2014-12-07 LAB — GLUCOSE, SEROUS FLUID: GLUCOSE FL: 95 mg/dL

## 2014-12-07 LAB — PREPARE RBC (CROSSMATCH)

## 2014-12-07 MED ORDER — LIDOCAINE HCL (PF) 1 % IJ SOLN
INTRAMUSCULAR | Status: AC
  Filled 2014-12-07: qty 10

## 2014-12-07 MED ORDER — POTASSIUM CHLORIDE CRYS ER 20 MEQ PO TBCR
20.0000 meq | EXTENDED_RELEASE_TABLET | Freq: Two times a day (BID) | ORAL | Status: DC
  Administered 2014-12-07 – 2014-12-09 (×4): 20 meq via ORAL
  Filled 2014-12-07 (×5): qty 1

## 2014-12-07 MED ORDER — POTASSIUM CHLORIDE 10 MEQ/100ML IV SOLN
10.0000 meq | Freq: Once | INTRAVENOUS | Status: AC
  Administered 2014-12-07: 10 meq via INTRAVENOUS
  Filled 2014-12-07: qty 100

## 2014-12-07 NOTE — Consult Note (Addendum)
CARDIOLOGY CONSULT NOTE   Patient ID: Ronald Bell MRN: [id number], DOB/AGE: Oct 25, 1940   Admit date: 12/03/2014 Date of Consult: 12/07/2014   Primary Physician: [name], DO Primary Cardiologist: Dr. Marlou Porch  Valvular specialist: Dr. Angelena Form  Pt. Profile  74 year old male with history of severe COPD, hypertension, hyperlipidemia, PVD, history of tobacco abuse, carotid artery occlusion, history of GI bleed with multiple ablation, CAD and severe AS s/p bioprosthetic valve presented with GI bleed. Cardiology consulted given his recent stent and DAPT  Problem List  Past Medical History  Diagnosis Date  . Polyp of nasal cavity   . Rosacea   . Hx of adenomatous colonic polyps 2012, 2013.   . Tobacco abuse   . Hypertension   . Hyperlipidemia   . GERD (gastroesophageal reflux disease)   . GI bleed 2010    4 units PRBCs  . Peripheral vascular disease   . Irregular heartbeat   . Shingles   . Carotid artery occlusion   . Angiodysplasia of intestine with hemorrhage     large and SB, gastric AVMs.   . Pleural plaque with presence of asbestos 03/27/2013    Followed in Pulmonary clinic/ [facility]/ [place] - F/u CT 09/08/2013 1. Stable extensive calcified pleural plaque formation consistent with asbestos exposure. 2. Multiple pulmonary nodules are unchanged from the CT of 6 months ago. Given risk factors for lung cancer, continued follow up is recommended with chest CT in 6 months> done 04/20/14 no change >repeat in 12 m in tickle file     . GERD 11/30/2008    Qualifier: Diagnosis of  By: Marijean Niemann CMA, Danielle    . PVD (peripheral vascular disease) 10/18/2012  . Carotid artery stenosis 04/22/2012  . Coronary artery disease   . COPD GOLD III with min reversibilty  08/11/2006    Followed in Pulmonary clinic/ [facility]/ [place] - PFT's 04/28/2013  FEV1 0.88 (40%) with ratio 44 and 14% better p B2 dlco 45 corrects to 83 - Trial of breo 04/28/2013 > improved symptoms  06/09/2013  -  spirometry 06/04/2014 FEV1  0.76 (29%) ratio 45      . Iron deficiency anemia 01/25/2009    Qualifier: Diagnosis of  By: Henrene Pastor MD, Docia Chuck   . History of blood transfusion "couple times"    "related to bleeding in colon and esophagus"  . Arthritis     "left shoulder" (10/19/2014)  . Anxiety   . 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 Iliac artery stent Left 2005    CIA  . Esophagogastroduodenoscopy  2012    normal  . Colonoscopy  July 2015    Dr. Henrene Pastor  . Tonsillectomy    . Knee arthroscopy with medial menisectomy Left 03/08/2014    Procedure: LEFT KNEE SCOPE WITH MEDIAL MENISECTOMY AND CHONDROPLASTY;  Surgeon: Ninetta Lights, MD;  Location: [place];  Service: Orthopedics;  Laterality: Left;  . Left and right heart catheterization with coronary angiogram N/A 06/28/2014    Procedure: LEFT AND RIGHT HEART CATHETERIZATION WITH CORONARY ANGIOGRAM;  Surgeon: Jerline Pain, MD;  Location: [facility] CATH LAB;  Service: Cardiovascular;  Laterality: N/A;  . Esophagogastroduodenoscopy N/A 08/17/2014    Procedure: ESOPHAGOGASTRODUODENOSCOPY (EGD);  Surgeon: Jerene Bears, MD;  Location: [facility] ENDOSCOPY;  Service: Endoscopy;  Laterality: N/A;  . Colonoscopy N/A 08/17/2014  Procedure: COLONOSCOPY;  Surgeon: Jerene Bears, MD;  Location: [facility] ENDOSCOPY;  Service: Endoscopy;  Laterality: N/A;  . Cardiac catheterization  2001; 06/28/2014  . Cardiac catheterization N/A 10/19/2014    Procedure: Coronary Stent Intervention;  Surgeon: Burnell Blanks, MD;  Location: [place] CV LAB;  Service: Cardiovascular;  Laterality: N/A;  BMS Mid RCA  . Transcatheter aortic valve replacement, transapical N/A 11/20/2014    Procedure: TRANSCATHETER AORTIC VALVE REPLACEMENT, TRANSAPICAL;  Surgeon: [name], MD;  Location: [place];  Service: Open Heart Surgery;   Laterality: N/A;  . Tee without cardioversion N/A 11/20/2014    Procedure: TRANSESOPHAGEAL ECHOCARDIOGRAM (TEE);  Surgeon: [name], MD;  Location: [place];  Service: Open Heart Surgery;  Laterality: N/A;  . Rib plating Left 11/20/2014    Procedure: RIB PLATING OF LEFT 8TH RIB;  Surgeon: [name], MD;  Location: [place];  Service: Open Heart Surgery;  Laterality: Left;  . Colonoscopy N/A 12/05/2014    Procedure: COLONOSCOPY;  Surgeon: Manus Gunning, MD;  Location: [place];  Service: Gastroenterology;  Laterality: N/A;  . Enteroscopy N/A 12/05/2014    Procedure: ENTEROSCOPY;  Surgeon: Manus Gunning, MD;  Location: [place];  Service: Gastroenterology;  Laterality: N/A;     Allergies  No Known Allergies  HPI   The patient is a 74 year old male with history of severe COPD, hypertension, hyperlipidemia, PVD, history of tobacco abuse, carotid artery occlusion, history of GI bleed with multiple ablation, CAD and severe AS s/p bioprosthetic valve. His echocardiogram in January 2016 showed severe AS with mean gradient of 34 and peak gradient 60. Estimated aortic valve area 0.8 cm. EF was normal. Since then, he has been admitted for congestive heart failure and COPD exacerbation. He underwent a left and right heart cath by Dr. Marlou Porch on 06/28/2014 and found to have heavily calcified coronary was severe disease in mid LAD (90%), mid RCA (90%) and moderate severe disease in the ostial circumflex (90%), EF 65%. He has calculated valve area 0.67 cm with mean gradient 41 and peak gradient 44. He has known PAD with occluded right iliac artery and a moderate left iliac artery stenosis with prior stent placement. He has history of GI bleed with angiodysplasia 7 years ago status post multiple ablation procedure this year. He had a history of GI bleed on Plavix. He was referred to Dr. 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  He initially planned for PCI of RCA in May, however he had GI bleed. 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 Plavix was stopped. He underwent RCA Rotablator procedure on 7/29 which showed 95% calcified stenosis treated with bare-metal stent (REBEL MR 3.5X16), 40% proximal RCA stent residual. He was loaded with Plavix during the procedure. He underwent TAVR with Edwards Sapien XT 84mm Valve on 11/20/2014. He did have postoperative atrial fibrillation and was placed on oral amiodarone after conversion on IV amio. No systemic anticoagulation was planned unless he has recurrence of atrial fibrillation. She was discharged to SNF on 11/27/2014.  He presented back to [facility] on 12/03/2014 after 4 days of dark stool. In the ED, he was found to have a hemoglobin of 5.1. GI  was consulted and the he underwent small bowel enteroscopy which showed 2 tissue no AVMs, 1 subepithelial duodenal lesion, 1 duodenal AVMs, one gastric AVM, the multiple AVMs were ablated on 12/05/2014. His aspirin and Plavix has been held. He has also been transfused with packed red blood cells with improvement of her hemoglobin. Cardiothoracic surgery was consulted, per Dr. Roxy Manns, the thrombogenicity of bioprosthetic valve is fairly low, therefore patient is at more risk for acute MI about thromboembolism and stroke from valvular perspective, therefore further anticoagulation therapy should be based on the presence of severe CAD and recent PCI with stenting. Echocardiogram was obtained on 12/06/2014 which showed EF of 19-50%, grade 1 diastolic dysfunction, stented bioprosthetic valve with mean gradient 7, peak gradient 13. During this admission, he was also noted to have left pleural effusion. He is currently  undergoing thoracentesis today. Per GI note on 12/06/2014, multiple AVMs were cauterized during this admission, his H&H is stable. If clear from cardiac perspective, may consider trial of octreotide if he has further bleeding to reduce rebleeding from AVMs. Per GI, if serial H&H show no evidence of active bleeding, he is okay to resume Plavix.   Inpatient Medications  . amiodarone  200 mg Oral BID PC  . antiseptic oral rinse  7 mL Mouth Rinse BID  . Fluticasone Furoate-Vilanterol  100 mcg Inhalation Daily  . furosemide  60 mg Intravenous BID  . lidocaine (PF)      . metoprolol tartrate  12.5 mg Oral BID  . pantoprazole  40 mg Oral Daily  . potassium chloride  20 mEq Oral BID    Family History Family History  Problem Relation Age of Onset  . Lung disease Mother     pulm fibrosis  . Uterine cancer    . Colitis Father   . Colon cancer Neg Hx   . Heart disease Brother   . Hypertension Brother   . Hyperlipidemia Brother   . Heart disease Daughter     cad  . Hypertension Son      Social History Social History   Social History  . Marital Status: Married    Spouse Name: N/A  . Number of Children: 4  . Years of Education: N/A   Occupational History  . Charity fundraiser    Social History Main Topics  . Smoking status: Former Smoker -- 1.00 packs/day for 60 years    Types: Cigarettes, E-cigarettes    Quit date: 04/17/2014  . Smokeless tobacco: Never Used  . Alcohol Use: 8.4 oz/week    14 Glasses of wine per week  . Drug Use: No  . Sexual Activity: No   Other Topics Concern  . Not on file   Social History Narrative     Review of Systems  General:  No chills, fever, night sweats or weight changes.  Cardiovascular:  No chest pain, edema, orthopnea, palpitations, paroxysmal nocturnal dyspnea. Dermatological: No rash, lesions/masses Respiratory: No cough +dyspnea Urologic: No hematuria, dysuria Abdominal:   No nausea, vomiting, diarrhea, bright red blood per  rectum, hematemesis +dark stool Neurologic:  No visual changes, wkns, changes in mental status. All other systems reviewed and are otherwise negative except as noted above.  Physical Exam  Blood pressure 113/78, pulse 90, temperature 97.5 F (36.4 C), temperature source Oral, resp. rate 28, height 5\' 3"  (1.6 m), weight 137 lb 4.8 oz (62.279 kg), SpO2 95 %.  General: Pleasant, NAD Psych: Normal affect. Neuro: Alert and oriented X 3. Moves all extremities spontaneously. HEENT:  Normal  Neck: Supple without bruits or JVD. Lungs:  Resp regular and unlabored. Decreased breath sound bilaterally, but no rale.  Heart: RRR no s3, s4, or murmurs. Abdomen: Soft, non-tender, non-distended, BS + x 4.  Extremities: No clubbing, cyanosis or edema. DP/PT/Radials 2+ and equal bilaterally.  Labs   Recent Labs  12/04/14 1940 12/05/14 0120 12/05/14 0559  TROPONINI 0.04* 0.03 0.03   Lab Results  Component Value Date   WBC 11.0* 12/07/2014   HGB 9.4* 12/07/2014   HCT 28.4* 12/07/2014   MCV 88.8 12/07/2014   PLT 485* 12/07/2014    Recent Labs Lab 12/07/14 0330  NA 134*  K 3.4*  CL 94*  CO2 34*  BUN 16  CREATININE 0.83  CALCIUM 8.4*  PROT 5.8*  BILITOT 0.5  ALKPHOS 56  ALT 19  AST 18  GLUCOSE 103*   Lab Results  Component Value Date   CHOL 170 11/05/2014   HDL 36.20* 11/05/2014   LDLCALC 96 11/05/2014   TRIG 192.0* 11/05/2014   No results found for: DDIMER  Radiology/Studies  Dg Chest 2 View  12/03/2014   CLINICAL DATA:  Dark stools onset 4 days ago. Shortness of breath and hypoxia. Aortic valve replacement 11/20/2014.  EXAM: CHEST  2 VIEW  COMPARISON:  11/23/2014, 05/31/2014 and chest CT 07/09/2014  FINDINGS: Lungs are adequately inflated and demonstrate worsening moderate size left pleural effusion likely with associated atelectasis in the left base. Cannot completely exclude infection in the left base. Evidence of known bilateral calcified pleural plaques. Cardiomediastinal  silhouette is unchanged. There is calcified plaque over the thoracoabdominal aorta. Prosthetic aortic valve unchanged. Remainder of the exam is unchanged.  IMPRESSION: Worsening moderate size left pleural effusion likely with associated atelectasis. Cannot exclude infection in the left base.  Stable asbestos related pleural disease.   Electronically Signed   By: Marin Olp M.D.   On: 12/03/2014 19:55   Dg Chest 2 View  11/23/2014   CLINICAL DATA:  Status post aortic valve replacement and plating at the left eighth rib on 11/20/2014.  EXAM: CHEST  2 VIEW  COMPARISON:  Single view of the chest 11/22/2014.  FINDINGS: Bibasilar atelectasis seen on yesterday's examination is improved. There small bilateral pleural effusions. No pneumothorax is identified. Heart size is mildly enlarged. Fixation of the left eighth rib with plate and screws is noted. Right IJ catheter is unchanged.  IMPRESSION: Improved bibasilar atelectasis.  Cardiomegaly without edema.   Electronically Signed   By: Inge Rise M.D.   On: 11/23/2014 07:25   Dg Chest 2 View  11/16/2014   CLINICAL DATA:  Aortic stenosis .  EXAM: CHEST  2 VIEW  COMPARISON:  CT 07/09/2014.  Chest x-ray 05/31/2014.  FINDINGS: Mediastinum and hilar structures are normal. Heart size is normal. Diffuse bilateral pulmonary interstitial prominence noted consistent with pneumonitis. Stable bilateral nodularity with calcified pleural plaques noted. Prior asbestos exposure could present in this fashion. Reference is made to prior CT report 07/09/2014 .  IMPRESSION: Chronic interstitial lung disease with calcified pleural plaques and nodularity consistent with prior asbestos exposure. No acute cardiopulmonary disease identified   Electronically Signed   By: Ol[place]o   On: 11/16/2014 12:26   Dg Chest Port 1 View  12/06/2014   CLINICAL DATA:  History of pleural effusion.  EXAM: PORTABLE CHEST - 1 VIEW  COMPARISON:  11/22/2014  FINDINGS: Prosthetic aortic valve is  stable in appearance.  Cardiomediastinal silhouette is partially obscured by a large left pleural effusion.  Mediastinal contours appear intact. Atherosclerotic disease of the aorta is seen.  There is no evidence of pneumothorax. The left-sided pleural effusion has apparently increased in size. Left lower lobe airspace consolidation is likely present. Calcified pleural plaques are again seen.  Osseous structures are without acute abnormality. Soft tissues are grossly normal.  IMPRESSION: Enlarging left pleural effusion, possibly loculated. There is likely left lower lung field airspace consolidation.  Stable appearance of calcified pleural plaques.   Electronically Signed   By: Fidela Salisbury M.D.   On: 12/06/2014 07:53   Dg Chest Port 1 View  11/22/2014   CLINICAL DATA:  COPD, CHF, post TAVR  EXAM: PORTABLE CHEST - 1 VIEW  COMPARISON:  Portable exam 0541 hours compared to 11/21/2014  FINDINGS: Pair of RIGHT jugular catheters project over SVC.  Borderline enlargement of cardiac silhouette.  Atherosclerotic calcification aorta.  Pulmonary vascular congestion.  Linear subsegmental atelectasis at at both lung bases.  Minimal pulmonary edema persists with a background of COPD changes.  No pleural effusion or gross pneumothorax, tip of RIGHT apex excluded.  Bones demineralized.  Post plating of a lower LEFT rib.  IMPRESSION: Increased bibasilar atelectasis.  COPD changes with superimposed CHF.   Electronically Signed   By: Lavonia Dana M.D.   On: 11/22/2014 07:49   Dg Chest Port 1 View  11/21/2014   CLINICAL DATA:  Congestive heart failure, valvular heart disease with transcatheter aortic valve replacement, COPD  EXAM: PORTABLE CHEST - 1 VIEW  COMPARISON:  Portable chest x-ray of November 20, 2014  FINDINGS: The lungs are well-expanded. The interstitial markings remain coarse but are less conspicuous. Stable nodularity in the right lower lung projecting over the posterior aspect of the eighth rib is stable. There  is stable subsegmental atelectasis in the left mid lung. There is no significant pleural effusion. The cardiac silhouette remains enlarged. The pulmonary vascularity is mildly prominent centrally.  There has been interval extubation of the trachea and esophagus. The Swan-Ganz catheter has been removed. The right internal jugular Cordis sheath as well as adjacent venous catheter are stable in position. The left chest tube is stable in appearance lying just inferior to the posterior aspect of the fifth rib.  IMPRESSION: Interval removal of the endotracheal and esophagogastric tubes as well Swan-Ganz catheter. Otherwise stable low-grade CHF superimposed upon COPD.   Electronically Signed   By: David  Martinique M.D.   On: 11/21/2014 07:14   Dg Chest Port 1 View  11/20/2014   CLINICAL DATA:  CHF.  Postop TAVR  EXAM: PORTABLE CHEST - 1 VIEW  COMPARISON:  11/16/2014  FINDINGS: Endotracheal tube in good position . Swan-Ganz catheter in the main pulmonary artery. Right jugular central venous catheter tip in the SVC. Left chest tube in good position. NG tube in the stomach.  Aortic valve prosthesis in good position.  No pneumothorax.  Mild vascular congestion similar to the prior study. Mild interstitial edema.  Calcified pleural plaque.  IMPRESSION: Mild congestive heart failure with interstitial edema similar to the prior study.  Support lines in good position.  No pneumothorax.   Electronically Signed   By: Franchot Gallo M.D.   On: 11/20/2014 11:34    ECG  Normal sinus rhythm without significant ST-T wave changes.  ASSESSMENT AND PLAN  1. Iron deficiency anemia 2/2 GI bleed  - h/o GI bleed, last admission with GI bleed in May 2016  - s/p AVMs ablaton by GI 9/15  - he is safe to withhold ASA and  plavix since he received BMS and is more than 1 month out of procedure  - although he did have rotablator stent to RCA, he has residual stenosis in mid LAD and ost LCx, see cath from Apr 2016 below  - ideally should  be back on at least ASA prior to discharge. Will hold off on restarting plavix for now.   - per GI, If clear from cardiac perspective, may consider trial of octreotide for which there is evidence to reduce rebleeding from AVMs, will discuss with MD  2. CAD   - cath 06/28/2014 and found to have heavily calcified coronary was severe disease in mid LAD (90%), mid RCA (90%) and moderate severe disease in the ostial circumflex (90%), EF 65%  - RCA Rotablator procedure on 7/29 which showed 95% calcified stenosis treated with bare-metal stent (REBEL MR 3.5X16), 40% proximal RCA stent residual  3. severe AS s/p bioprosthetic valve Edwards Sapien XT 17mm Valve on 11/20/2014  - s/p thoracentesis, diffusely decreased breath sound, but no rale or LE edema, on 60mg  BID IV lasix, expect to transition to PO lasix soon.  4. severe COPD 5. Hypertension 6. Hyperlipidemia 7. PVD 8. history of tobacco abuse 9. carotid artery occlusion   Signed, Almyra Deforest, PA-C 12/07/2014, 1:43 PM   Patient seen and examined with Almyra Deforest, PA-C. We discussed all aspects of the encounter. I agree with the assessment and plan as stated above.  As bare metal stent has been in placed > 1 month can stop ASA and Plavix. Resume ASA 81 when OK with GI. Can stop Plavix completely. I would be OK with cautious trial of octreotide for splanchnic vasoconstriction.   We will follow at a distance.   Please call us with questions.   Bensimhon, Daniel,MD 2:47 PM

## 2014-12-07 NOTE — Procedures (Signed)
Successful US guided left thoracentesis. Yielded 800 ml of dark bloody fluid. Pt tolerated procedure well. No immediate complications.  Specimen was sent for labs. CXR ordered.  Tsosie Billing D PA-C 12/07/2014 2:08 PM

## 2014-12-07 NOTE — Progress Notes (Signed)
[place] TEAM 1 - Stepdown/ICU TEAM PROGRESS NOTE  DARAY POLGAR PFX:[id number] DOB: 10-19-1940 DOA: 12/03/2014 PCP: [name], DO  Admit HPI / Brief Narrative: 74 y.o. male with HX Anxiety, tobacco abuse, HTN, HLD, carotid artery stenosis, CAD, severe AS, PVD, COPD, GI bleed 2010,due to angiodysplasia of the colon who underwent placement of a bare metal stent in the mid RCA 10/19/2014 followed by a transcatheter aortic valve replacement 11/20/2014. He was discharged on 9/6 to [facility] for rehabilitation. He was on ASA and plavix. He developed dark tarry stools approximately 4 days prior to this admission. No N/V. No abdominal pain.  In the ER, his Hgb was found to be 5.1.   HPI/Subjective: The pt c/o sob, much worse w/ exertion.  He denies CP, n/v, or abdom pain.  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  -GI suggests considering a trial of depo octreotide if cleared for same by Cardiology - I have asked Cardiology to comment  -cleared to resume ASA/Plavix from GI standpoint - will ask Cards if both still needed  -Transfused 4 units of PRBC thus far - transfuse 1 additional today to keep at 10  CAD - bare metal stent in the mid RCA 10/19/2014 -s/p successful rotablator atherectomy of the mid RCA with bare metal stent placement  -ASA and Plavix held secondary to GI bleed (has completed a full month of treatment since stent placement)  -Troponin negative, EKG when compared to EKG from 8/30 no significant change -Cards consulted to see today   S/p transcatheter aortic valve replacement (TAVR) -d/c'd 9/6 to rehabilitation facility -No apparent immediate complications - this admission unrelated -TCTS following   Atrial fibrillation -Diagnosed September 2016 around time of  TAVR -Maintaining normal sinus rhythm -Continue amiodarone  Hypertension -Blood pressure stable presently   L pleural effusion  -pt is now quite dyspneic - TCTS suggests thora - IR to perform today  -likely related to recent TAVR (in are of 8th rib Synthes plate fixation)  COPD -no active wheezing  Hypokalemia  -replace and follow   Code Status: FULL Family Communication: no family present at time of exam Disposition Plan: SDU - PT/OT evals - pt highly motivated to go home   Consultants: Financial controller GI TCTS [facility] Cards  Procedures: TTE - mild LVH - EF 60-65% - no WMA - grade 1 DD - AoV poorly visualized   Antibiotics: None  DVT prophylaxis: SCDs  Objective: Blood pressure 103/75, pulse 76, temperature 98.1 F (36.7 C), temperature source Oral, resp. rate 23, height 5\' 3"  (1.6 m), weight 62.279 kg (137 lb 4.8 oz), SpO2 97 %.  Intake/Output Summary (Last 24 hours) at 12/07/14 1006 Last data filed at 12/07/14 0800  Gross per 24 hour  Intake    730 ml  Output   1600 ml  Net   -870 ml   Exam: General: No acute respiratory distress at rest in bed  Lungs: diminished breath sounds L base - no wheeze - CTA otherwise  Cardiovascular: Regular rate and rhythm without gallop or rub  Abdomen: Nontender, nondistended, soft, bowel sounds positive, no rebound, no ascites, no appreciable mass Extremities: No significant cyanosis, clubbing, edema bilateral lower extremities  Data Reviewed: Basic Metabolic Panel:  Recent Labs Lab 12/03/14 2044 12/04/14 0230 12/05/14 0559 12/06/14 0240 12/07/14 0330  NA 131* 132* 133* 131* 134*  K 4.8 4.2 4.2 4.1 3.4*  CL 97* 97* 98* 95* 94*  CO2 27 28 30 28  34*  GLUCOSE 118* 100* 97 107* 103*  BUN 17 15 6 14 16   CREATININE 0.78 0.70 0.67 0.66 0.83  CALCIUM 8.7* 8.4* 8.7* 8.5* 8.4*  MG  --   --  2.0  --  1.8    CBC:  Recent Labs Lab 12/03/14 1918  12/05/14 0559 12/05/14 1356 12/06/14 0728 12/06/14 1345 12/07/14 0330  WBC  15.9*  < > 12.4* 16.4* 13.6* 14.2* 11.0*  NEUTROABS 12.7*  --  9.9*  --   --   --  8.6*  HGB 5.1*  < > 7.9* 7.7* 9.5* 10.5* 9.4*  HCT 16.1*  < > 24.0* 23.5* 29.5* 31.6* 28.4*  MCV 94.2  < > 90.6 91.4 87.5 88.0 88.8  PLT 733*  < > 588* 595* 535* 589* 485*  < > = values in this interval not displayed.  Liver Function Tests:  Recent Labs Lab 12/03/14 2044 12/05/14 0559 12/06/14 0240 12/07/14 0330  AST 28 20 23 18   ALT 26 23 22 19   ALKPHOS 49 54 59 56  BILITOT 0.3 0.7 1.2 0.5  PROT 5.4* 5.9* 5.6* 5.8*  ALBUMIN 2.6* 2.6* 2.6* 2.6*   Coags:  Recent Labs Lab 12/03/14 1918  INR 1.27    Recent Labs Lab 12/03/14 1918  APTT 29    Cardiac Enzymes:  Recent Labs Lab 12/04/14 1940 12/05/14 0120 12/05/14 0559  TROPONINI 0.04* 0.03 0.03     Studies:   Recent x-ray studies have been reviewed in detail by the Attending Physician  Scheduled Meds:  Scheduled Meds: . amiodarone  200 mg Oral BID PC  . antiseptic oral rinse  7 mL Mouth Rinse BID  . Fluticasone Furoate-Vilanterol  100 mcg Inhalation Daily  . furosemide  60 mg Intravenous BID  . metoprolol tartrate  12.5 mg Oral BID  . pantoprazole  40 mg Oral Daily    Time spent on care of this patient: 35 mins   MCCLUNG,JEFFREY T , MD   [facility] Office  [phone] Pager - Text Page per Shea Evans as per below:  On-Call/Text Page:      [email or web address]      password [email or web address]  If 7PM-7AM, please contact night-coverage [email or web address] Password [email or web address] 12/07/2014, 10:06 AM   LOS: 4 days

## 2014-12-07 NOTE — Progress Notes (Signed)
      [address]       [place] [address]             [phone]     CARDIOTHORACIC SURGERY PROGRESS NOTE  2 Days Post-Op  S/P Procedure(s) (LRB): COLONOSCOPY (N/A) ENTEROSCOPY (N/A)  Subjective: Feels a little better since thoracentesis.  Objective: Vital signs in last 24 hours: Temp:  [97.4 F (36.3 C)-98.5 F (36.9 C)] 97.4 F (36.3 C) (09/16 1800) Pulse Rate:  [66-95] 90 (09/16 1800) Cardiac Rhythm:  [-] Normal sinus rhythm (09/16 0800) Resp:  [20-33] 23 (09/16 1800) BP: (102-143)/(38-80) 142/45 mmHg (09/16 1800) SpO2:  [94 %-100 %] 96 % (09/16 1800) Weight:  [62.279 kg (137 lb 4.8 oz)] 62.279 kg (137 lb 4.8 oz) (09/16 0238)  Physical Exam:  Rhythm:   sinus  Breath sounds: Scattered rhonchi, improved breath sounds  Heart sounds:  RRR  Incisions:  Healing nicely  Abdomen:  Soft, non-distended, non-tender  Extremities:  Warm, well-perfused    Intake/Output from previous day: 09/15 0701 - 09/16 0700 In: 790 [P.O.:690; IV Piggyback:100] Out: 1600 [Urine:1600] Intake/Output this shift: Total I/O In: 730 [P.O.:730] Out: 1200 [Urine:1200]  Lab Results:  Recent Labs  12/06/14 1345 12/07/14 0330  WBC 14.2* 11.0*  HGB 10.5* 9.4*  HCT 31.6* 28.4*  PLT 589* 485*   BMET:  Recent Labs  12/06/14 0240 12/07/14 0330  NA 131* 134*  K 4.1 3.4*  CL 95* 94*  CO2 28 34*  GLUCOSE 107* 103*  BUN 14 16  CREATININE 0.66 0.83  CALCIUM 8.5* 8.4*    CBG (last 3)  No results for input(s): GLUCAP in the last 72 hours. PT/INR:  No results for input(s): LABPROT, INR in the last 72 hours.  CXR:  CHEST 1 VIEW  COMPARISON: December 06, 2014  FINDINGS: Left effusion is markedly smaller post thoracentesis. No pneumothorax. There is a small residual left effusion with patchy left base atelectasis. Lungs elsewhere clear. Heart is mildly enlarged with pulmonary vascularity within normal limits. There is atherosclerotic change in aorta. No  adenopathy. There is postoperative change involving a lower thoracic rib on the left. There is a prosthetic aortic valve.  IMPRESSION: No pneumothorax. Small residual left effusion with left base atelectasis. No new opacity. No change in cardiac silhouette.   Electronically Signed  By: Lowella Grip III M.D.  On: 12/07/2014 14:31  Assessment/Plan: S/P Procedure(s) (LRB): COLONOSCOPY (N/A) ENTEROSCOPY (N/A)  Ronald Bell remains stable and breathing somewhat improved since thoracentesis earlier today.  Hgb has been essentially stable last 24 hours and no signs of ongoing GI bleeding.  Will recheck CXR tomorrow.  Remainder of plans per medical teams.  Will cancel his previously scheduled appointment in our office for Monday 9/19 and reschedule for 2-3 weeks with another f/u CXR  I spent in excess of 15 minutes during the conduct of this hospital encounter and >50% of this time involved direct face-to-face encounter with the patient for counseling and/or coordination of their care.   Ronald Bell 12/07/2014 6:52 PM

## 2014-12-07 NOTE — Care Management Note (Signed)
Case Management Note  Patient Details  Name: Ronald Bell MRN: [id number] Date of Birth: December 12, 1940  Subjective/Objective:      Adm w gi bleed              Action/Plan: was at [facility] for rehab. Did well w phy there and phy there rec hhc. sw spoke w wife and wife has home o2-rolator.   Expected Discharge Date:                  Expected Discharge Plan:  [place]  In-House Referral:  Clinical Social Work  Discharge planning Services  CM Consult  Post Acute Care Choice:  Durable Medical Equipment, Home Health Choice offered to:  Spouse  DME Arranged:  Tub bench DME Agency:  [place] Arranged:  RN, PT, Nurse's Aide [place] Agency:  [place]  Status of Service:     Medicare Important Message Given:  Yes-second notification given Date Medicare IM Given:    Medicare IM give by:    Date Additional Medicare IM Given:    Additional Medicare Important Message give by:     If discussed at [place] of Stay Meetings, dates discussed:    Additional Comments: wife has no pref to hhc agency but loves [facility]. Ref to [facility] w [facility] for hhrn-pt-aid. Tub bench req by fam and order placed. Will await final orders at disch.  Lacretia Leigh, RN 12/07/2014, 3:25 PM

## 2014-12-07 NOTE — Consult Note (Signed)
[facility] CM Inpatient Consult   12/07/2014  Ronald Bell 02/02/1941 [id number] Referral received for possible community follow up at discharge. Patient with history of TAVR; COPD with recent hospital stay for GI Bleed. Met briefly with patient at bedside.  Patient verbalized that he felt that his needs would be met.  Explained [facility] Care Management for care management services post hospital follow up.  Patient states he will take a brochure and contact information.  Left requested brochure at the bedside.  For questions, please contact: Victoria Brewer, RN BSN CCM [facility] Hospital Liaison  [phone] business mobile phone   

## 2014-12-07 NOTE — Evaluation (Signed)
Physical Therapy Evaluation Patient Details Name: Ronald Bell MRN: [id number] DOB: 07-16-1940 Today's Date: 12/07/2014   History of Present Illness  Pt is a 74 y/o male who presents with ABLA and GIB from [facility] after recent TAVR procedure and subsequent 8th rib plating due to fracture during procedure, on 11/20/14.  Clinical Impression  Pt very pleasant and moving well. He reports he has been using a RW only at times at [facility] but has been working with therapy. Pt with decreased activity tolerance and will benefit from acute therapy to maximize mobility and independence to decrease burden of care.     Follow Up Recommendations Home health PT;Supervision for mobility/OOB    Equipment Recommendations  None recommended by PT    Recommendations for Other Services       Precautions / Restrictions Precautions Precautions: Fall      Mobility  Bed Mobility               General bed mobility comments: EOB on arrival  Transfers Overall transfer level: Needs assistance   Transfers: Sit to/from Stand Sit to Stand: Supervision         General transfer comment: cues for hand placement and safety  Ambulation/Gait Ambulation/Gait assistance: Min guard Ambulation Distance (Feet): 150 Feet Assistive device: Rolling walker (2 wheeled) Gait Pattern/deviations: Step-through pattern;Decreased stride length   Gait velocity interpretation: Below normal speed for age/gender General Gait Details: cues for posture and breathing technique  Stairs            Wheelchair Mobility    Modified Rankin (Stroke Patients Only)       Balance     Sitting balance-Leahy Scale: Good       Standing balance-Leahy Scale: Fair                               Pertinent Vitals/Pain Pain Assessment: No/denies pain  HR 90 with activity sats 93-97% on 4L with gait    Home Living Family/patient expects to be discharged to:: Private residence Living  Arrangements: Spouse/significant other Available Help at Discharge: Family;Available 24 hours/day Type of Home: House Home Access: Stairs to enter   [employer] of Steps: 2 Home Layout: One level Home Equipment: Cane - single point;Walker - 4 wheels      Prior Function Level of Independence: Independent with assistive device(s)         Comments: prior to TAVR and has been progressing with mobility since D/C to SNF     Hand Dominance        Extremity/Trunk Assessment   Upper Extremity Assessment: Overall WFL for tasks assessed           Lower Extremity Assessment: Overall WFL for tasks assessed      Cervical / Trunk Assessment: Normal  Communication   Communication: No difficulties  Cognition Arousal/Alertness: Awake/alert Behavior During Therapy: WFL for tasks assessed/performed Overall Cognitive Status: Within Functional Limits for tasks assessed                      General Comments      Exercises        Assessment/Plan    PT Assessment Patient needs continued PT services  PT Diagnosis Difficulty walking   PT Problem List Decreased activity tolerance;Decreased balance;Decreased mobility;Cardiopulmonary status limiting activity;Decreased knowledge of use of DME  PT Treatment Interventions DME instruction;Gait training;Stair training;Functional mobility training;Therapeutic activities;Therapeutic exercise;Patient/family education  PT Goals (Current goals can be found in the Care Plan section) Acute Rehab PT Goals Patient Stated Goal: return home PT Goal Formulation: With patient/family Time For Goal Achievement: 12/21/14 Potential to Achieve Goals: Good    Frequency Min 3X/week   Barriers to discharge Decreased caregiver support      Co-evaluation               End of Session Equipment Utilized During Treatment: Oxygen Activity Tolerance: Patient tolerated treatment well Patient left: in chair;with call bell/phone  within reach;with chair alarm set Nurse Communication: Mobility status         Time: 1200-1222 PT Time Calculation (min) (ACUTE ONLY): 22 min   Charges:   PT Evaluation $Initial PT Evaluation Tier I: 1 Procedure     PT G CodesMelford Aase 12/07/2014, 12:32 PM El[place] Reach, [place]

## 2014-12-08 ENCOUNTER — Inpatient Hospital Stay (HOSPITAL_COMMUNITY): Payer: Medicare Other

## 2014-12-08 LAB — CBC
HEMATOCRIT: 34.4 % — AB (ref 39.0–52.0)
HEMOGLOBIN: 11.3 g/dL — AB (ref 13.0–17.0)
MCH: 28.8 pg (ref 26.0–34.0)
MCHC: 32.8 g/dL (ref 30.0–36.0)
MCV: 87.5 fL (ref 78.0–100.0)
Platelets: 488 10*3/uL — ABNORMAL HIGH (ref 150–400)
RBC: 3.93 MIL/uL — AB (ref 4.22–5.81)
RDW: 17.3 % — ABNORMAL HIGH (ref 11.5–15.5)
WBC: 10.6 10*3/uL — ABNORMAL HIGH (ref 4.0–10.5)

## 2014-12-08 LAB — COMPREHENSIVE METABOLIC PANEL
ALK PHOS: 65 U/L (ref 38–126)
ALT: 20 U/L (ref 17–63)
ANION GAP: 7 (ref 5–15)
AST: 21 U/L (ref 15–41)
Albumin: 2.8 g/dL — ABNORMAL LOW (ref 3.5–5.0)
BUN: 14 mg/dL (ref 6–20)
CALCIUM: 8.8 mg/dL — AB (ref 8.9–10.3)
CO2: 31 mmol/L (ref 22–32)
Chloride: 97 mmol/L — ABNORMAL LOW (ref 101–111)
Creatinine, Ser: 0.8 mg/dL (ref 0.61–1.24)
GFR calc non Af Amer: >60 mL/min (ref >60)
Glucose, Bld: 125 mg/dL — ABNORMAL HIGH (ref 65–99)
POTASSIUM: 3.6 mmol/L (ref 3.5–5.1)
SODIUM: 135 mmol/L (ref 135–145)
TOTAL PROTEIN: 6.2 g/dL — AB (ref 6.5–8.1)
Total Bilirubin: 1.1 mg/dL (ref 0.3–1.2)

## 2014-12-08 LAB — TYPE AND SCREEN
ABO/RH(D): A POS
Antibody Screen: NEGATIVE
Unit division: 0

## 2014-12-08 LAB — AMYLASE, PLEURAL FLUID: Amylase, Pleural Fluid: 35 U/L

## 2014-12-08 MED ORDER — OCTREOTIDE ACETATE 100 MCG/ML IJ SOLN
100.0000 ug | Freq: Two times a day (BID) | INTRAMUSCULAR | Status: DC
  Administered 2014-12-08 – 2014-12-09 (×2): 100 ug via SUBCUTANEOUS
  Filled 2014-12-08 (×5): qty 1

## 2014-12-08 MED ORDER — OCTREOTIDE ACETATE 20 MG IM KIT
20.0000 mg | PACK | Freq: Once | INTRAMUSCULAR | Status: DC
  Filled 2014-12-08: qty 1

## 2014-12-08 MED ORDER — ASPIRIN 81 MG PO CHEW
81.0000 mg | CHEWABLE_TABLET | Freq: Every day | ORAL | Status: DC
  Administered 2014-12-08 – 2014-12-09 (×2): 81 mg via ORAL
  Filled 2014-12-08 (×2): qty 1

## 2014-12-08 MED ORDER — FUROSEMIDE 40 MG PO TABS
60.0000 mg | ORAL_TABLET | Freq: Two times a day (BID) | ORAL | Status: DC
  Administered 2014-12-08 – 2014-12-09 (×2): 60 mg via ORAL
  Filled 2014-12-08 (×4): qty 1

## 2014-12-08 NOTE — Progress Notes (Addendum)
   Agree with consult from yesterday. Patient has bare-metal stent of the right coronary. We prefer 6-12 months of dual antiplatelet therapy after any stent. In situations such as this however, 30 days of dual antiplatelet therapy suffices to decrease the risk of stent thrombosis. When safe reinstituting either aspirin or Plavix but not both would be appropriate.  No new clinical problems this morning.

## 2014-12-08 NOTE — Progress Notes (Signed)
      [address]       [place] [address]             [phone]      Follow up chest xray after thoracentesis stable  Grace Isaac MD      [place] [place] [address] Office [phone]   [place]

## 2014-12-08 NOTE — Progress Notes (Signed)
[place] TEAM 1 - Stepdown/ICU TEAM PROGRESS NOTE  Ronald Bell KYH:[id number] DOB: [birth year] DOA: 12/03/2014 PCP: [name], DO  Admit HPI / Brief Narrative: 74 y.o. male with HX Anxiety, tobacco abuse, HTN, HLD, carotid artery stenosis, CAD, severe AS, PVD, COPD, GI bleed 2010,due to angiodysplasia of the colon who underwent placement of a bare metal stent in the mid RCA 10/19/2014 followed by a transcatheter aortic valve replacement 11/20/2014. He was discharged on 9/6 to [facility] for rehabilitation. He was on ASA and plavix. He developed dark tarry stools approximately 4 days prior to this admission. No N/V. No abdominal pain.  In the ER, his Hgb was found to be 5.1.   HPI/Subjective: The patient has no complaints.  He reports he had a brown bowel movement today.  He is anxious to go home.  He denies chest pain nausea vomiting fevers or chills.  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  -GI suggests considering a trial of depo octreotide if cleared for same by Cardiology - cardiology feels this is reasonable - to begin trial of BID octreotide injections, w/ transition to depot form only after 2 weeks -cleared to resume ASA/Plavix from GI standpoint - cards feels aspirin alone is reasonable -Transfused 5 units of PRBC thus far  L pleural effusion  -800cc out per thora by IR 9/16 - CXR slightly improved/stable  -likely related to recent TAVR (in are of 8th rib Synthes plate fixation)  CAD - bare metal stent in the mid RCA 10/19/2014 -s/p successful rotablator atherectomy of the mid RCA with bare metal stent placement  -ASA and Plavix held secondary to GI bleed (has completed a full month of treatment since stent placement) - to resume aspirin alone -Troponin negative, EKG when  compared to EKG from 8/30 no significant change -Cards has seen  S/p transcatheter aortic valve replacement (TAVR) -d/c'd 9/6 to rehabilitation facility -No apparent immediate complications - this admission unrelated -TCTS following   Atrial fibrillation -Diagnosed September 2016 around time of TAVR -Maintaining normal sinus rhythm -Continue amiodarone  Hypertension -Blood pressure stable presently   COPD -no active wheezing  Hypokalemia  -replace pnr and follow   Code Status: FULL Family Communication: no family present at time of exam Disposition Plan: PT/OT evals - pt highly motivated to go home - possible d/c 9/18   Consultants: Velora Heckler GI TCTS [facility] Cards  Procedures: TTE - mild LVH - EF 60-65% - no WMA - grade 1 DD - AoV poorly visualized   Antibiotics: None  DVT prophylaxis: SCDs  Objective: Blood pressure 112/43, pulse 73, temperature 98.4 F (36.9 C), temperature source Oral, resp. rate 22, height 5\' 3"  (1.6 m), weight 58.9 kg (129 lb 13.6 oz), SpO2 97 %.  Intake/Output Summary (Last 24 hours) at 12/08/14 1444 Last data filed at 12/08/14 1400  Gross per 24 hour  Intake   1065 ml  Output   1000 ml  Net     65 ml   Exam: General: No acute respiratory distress Lungs: no wheeze - CTA - poor air movement L base  Cardiovascular: Regular rate and rhythm   Abdomen: Nontender, nondistended, soft, bowel sounds positive, no rebound, no ascites, no appreciable mass Extremities: No significant cyanosis, clubbing, or edema bilateral lower extremities  Data Reviewed: Basic Metabolic Panel:  Recent Labs Lab 12/04/14 0230 12/05/14 0559  12/06/14 0240 12/07/14 0330 12/08/14 0420  NA 132* 133* 131* 134* 135  K 4.2 4.2 4.1 3.4* 3.6  CL 97* 98* 95* 94* 97*  CO2 28 30 28  34* 31  GLUCOSE 100* 97 107* 103* 125*  BUN 15 6 14 16 14   CREATININE 0.70 0.67 0.66 0.83 0.80  CALCIUM 8.4* 8.7* 8.5* 8.4* 8.8*  MG  --  2.0  --  1.8  --     CBC:  Recent Labs Lab  12/03/14 1918  12/05/14 0559 12/05/14 1356 12/06/14 0728 12/06/14 1345 12/07/14 0330 12/08/14 0420  WBC 15.9*  < > 12.4* 16.4* 13.6* 14.2* 11.0* 10.6*  NEUTROABS 12.7*  --  9.9*  --   --   --  8.6*  --   HGB 5.1*  < > 7.9* 7.7* 9.5* 10.5* 9.4* 11.3*  HCT 16.1*  < > 24.0* 23.5* 29.5* 31.6* 28.4* 34.4*  MCV 94.2  < > 90.6 91.4 87.5 88.0 88.8 87.5  PLT 733*  < > 588* 595* 535* 589* 485* 488*  < > = values in this interval not displayed.  Liver Function Tests:  Recent Labs Lab 12/03/14 2044 12/05/14 0559 12/06/14 0240 12/07/14 0330 12/08/14 0420  AST 28 20 23 18 21   ALT 26 23 22 19 20   ALKPHOS 49 54 59 56 65  BILITOT 0.3 0.7 1.2 0.5 1.1  PROT 5.4* 5.9* 5.6* 5.8* 6.2*  ALBUMIN 2.6* 2.6* 2.6* 2.6* 2.8*   Coags:  Recent Labs Lab 12/03/14 1918  INR 1.27    Recent Labs Lab 12/03/14 1918  APTT 29    Cardiac Enzymes:  Recent Labs Lab 12/04/14 1940 12/05/14 0120 12/05/14 0559  TROPONINI 0.04* 0.03 0.03     Studies:   Recent x-ray studies have been reviewed in detail by the Attending Physician  Scheduled Meds:  Scheduled Meds: . amiodarone  200 mg Oral BID PC  . antiseptic oral rinse  7 mL Mouth Rinse BID  . Fluticasone Furoate-Vilanterol  100 mcg Inhalation Daily  . furosemide  60 mg Intravenous BID  . metoprolol tartrate  12.5 mg Oral BID  . pantoprazole  40 mg Oral Daily  . potassium chloride  20 mEq Oral BID    Time spent on care of this patient: 35 mins   MCCLUNG,JEFFREY T , MD   [facility] Office  [phone] Pager - Text Page per Shea Evans as per below:  On-Call/Text Page:      [email or web address]      password [email or web address]  If 7PM-7AM, please contact night-coverage [email or web address] Password [email or web address] 12/08/2014, 2:44 PM   LOS: 5 days

## 2014-12-09 DIAGNOSIS — J438 Other emphysema: Secondary | ICD-10-CM

## 2014-12-09 DIAGNOSIS — I499 Cardiac arrhythmia, unspecified: Secondary | ICD-10-CM

## 2014-12-09 LAB — BASIC METABOLIC PANEL
ANION GAP: 8 (ref 5–15)
BUN: 15 mg/dL (ref 6–20)
CALCIUM: 8.7 mg/dL — AB (ref 8.9–10.3)
CO2: 30 mmol/L (ref 22–32)
Chloride: 95 mmol/L — ABNORMAL LOW (ref 101–111)
Creatinine, Ser: 0.87 mg/dL (ref 0.61–1.24)
GFR calc Af Amer: >60 mL/min (ref >60)
GFR calc non Af Amer: >60 mL/min (ref >60)
GLUCOSE: 87 mg/dL (ref 65–99)
POTASSIUM: 4 mmol/L (ref 3.5–5.1)
Sodium: 133 mmol/L — ABNORMAL LOW (ref 135–145)

## 2014-12-09 LAB — CBC
HEMATOCRIT: 34.8 % — AB (ref 39.0–52.0)
Hemoglobin: 11.4 g/dL — ABNORMAL LOW (ref 13.0–17.0)
MCH: 29.2 pg (ref 26.0–34.0)
MCHC: 32.8 g/dL (ref 30.0–36.0)
MCV: 89.2 fL (ref 78.0–100.0)
PLATELETS: 457 10*3/uL — AB (ref 150–400)
RBC: 3.9 MIL/uL — ABNORMAL LOW (ref 4.22–5.81)
RDW: 16.8 % — AB (ref 11.5–15.5)
WBC: 8.7 10*3/uL (ref 4.0–10.5)

## 2014-12-09 MED ORDER — OCTREOTIDE ACETATE 100 MCG/ML IJ SOLN: 100.0000 ug | Freq: Two times a day (BID) | INTRAMUSCULAR | Status: DC

## 2014-12-09 MED ORDER — FUROSEMIDE 20 MG PO TABS: 60.0000 mg | ORAL_TABLET | Freq: Two times a day (BID) | ORAL | Status: DC

## 2014-12-09 MED ORDER — POTASSIUM CHLORIDE CRYS ER 20 MEQ PO TBCR: 20.0000 meq | EXTENDED_RELEASE_TABLET | Freq: Two times a day (BID) | ORAL | Status: DC

## 2014-12-09 MED ORDER — VALSARTAN 160 MG PO TABS: 160.0000 mg | ORAL_TABLET | Freq: Every day | ORAL | Status: DC

## 2014-12-09 NOTE — Progress Notes (Addendum)
Nothing new to add.

## 2014-12-09 NOTE — Progress Notes (Signed)
[facility] arranged on behalf of pt.

## 2014-12-09 NOTE — Discharge Instructions (Signed)
Gastrointestinal Bleeding °Gastrointestinal bleeding is bleeding somewhere along the path that food travels through the body (digestive tract). This path is anywhere between the mouth and the opening of the butt (anus). You may have blood in your throw up (vomit) or in your poop (stools). If there is a lot of bleeding, you may need to stay in the hospital. °HOME CARE °· Only take medicine as told by your doctor. °· Eat foods with fiber such as whole grains, fruits, and vegetables. You can also try eating 1 to 3 prunes a day. °· Drink enough fluids to keep your pee (urine) clear or pale yellow. °GET HELP RIGHT AWAY IF:  °· Your bleeding gets worse. °· You feel dizzy, weak, or you pass out (faint). °· You have bad cramps in your back or belly (abdomen). °· You have large blood clumps (clots) in your poop. °· Your problems are getting worse. °MAKE SURE YOU:  °· Understand these instructions. °· Will watch your condition. °· Will get help right away if you are not doing well or get worse. °Document Released: 12/17/2007 Document Revised: 02/24/2012 Document Reviewed: 02/16/2011 °ExitCare® Patient Information ©2015 [employer]. This information is not intended to replace advice given to you by your health care provider. Make sure you discuss any questions you have with your health care provider. ° ° °

## 2014-12-09 NOTE — Discharge Summary (Signed)
DISCHARGE SUMMARY  Ronald Bell  MR#: [id number]  DOB:07-18-1940  Date of Admission: 12/03/2014 Date of Discharge: 12/09/2014  Attending Physician:MCCLUNG,JEFFREY T  Patient's DJM:EQASTM Lowne, DO  Consults: [facility] Cards  Disposition: D/C home   Follow-up Appts:     Follow-up Information    Schedule an appointment as soon as possible for a visit with [name], DO.   Specialty:  Family Medicine   Why:  Make an appointment to be seen in 3-5 days    Contact information:   [address] [place] [address] [phone]      Tests Needing Follow-up: -The patient is instructed that if the cost of subcutaneous octreotide is prohibitive he may opt to not use this medication at home - if however he is able to afford it the medical team does feel it will likely be helpful in reducing the incidence of further GI bleeding -if pt tolerates and is not having further GIB on short acting SQ octreotide, he should be initiated on depo octreotide in 2 weeks time (roughly Oct 1) -f/u CBC should be accomplished in 5-7 days   Discharge Diagnoses: ABLA due to GI bleed with angiodysplasia of the colon L pleural effusion  CAD - bare metal stent in the mid RCA 10/19/2014 S/p transcatheter aortic valve replacement (TAVR) Atrial fibrillation Hypertension COPD Hypokalemia   Procedures: TTE - mild LVH - EF 60-65% - no WMA - grade 1 DD - AoV poorly visualized   Initial presentation: 74 y.o. male with HX Anxiety, tobacco abuse, HTN, HLD, carotid artery stenosis, CAD, severe AS, PVD, COPD, GI bleed 2010,due to angiodysplasia of the colon who underwent placement of a bare metal stent in the mid RCA 10/19/2014 followed by a transcatheter aortic valve replacement 11/20/2014. He was discharged on 9/6 to [facility] for rehabilitation. He was on ASA and plavix. He developed dark tarry stools approximately 4 days prior to this admission. No N/V. No  abdominal pain.  In the ER, his Hgb was found to be 5.1.   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  -GI suggested considering a trial of depo octreotide if cleared for same by Cardiology - Cardiology felt this was reasonable - to begin trial of BID octreotide injections, w/ transition to depot form after 2 weeks of short acting if well tolerated and beneficial - once depo is initiated, SQ dosing will need to continue for an additional 2 week overlap  -cleared to resume ASA/Plavix from GI standpoint - Cards feels aspirin alone is reasonable -Transfused 5 units of PRBC total   L pleural effusion  -800cc out per thora by IR 9/16 - CXR slightly improved/stable as of 9/17 -likely related to recent TAVR (in are of 8th rib [employer] plate fixation)  CAD - bare metal stent in the mid RCA 10/19/2014 -s/p successful rotablator atherectomy of the mid RCA with bare metal stent placement  -ASA and Plavix held secondary to GI bleed (had completed a full month of treatment since stent placement) - resumed aspirin alone -Troponin negative, EKG when compared to EKG from 8/30 no significant change -Cards has seen  S/p transcatheter aortic valve replacement (TAVR) -d/c'd 9/6 to rehabilitation facility -No apparent immediate complications - this admission unrelated -TCTS evaluated during hospital stay    Atrial fibrillation -Diagnosed September 2016  around time of TAVR -Maintained normal sinus rhythm th/o this stay  -Continue amiodarone  Hypertension -Blood pressure stable   COPD -no active wheezing  Hypokalemia  -replaced to normal     Medication List    STOP taking these medications        amLODipine 10 MG tablet  Commonly known as:  NORVASC     clopidogrel 75 MG tablet  Commonly known as:   PLAVIX     valsartan-hydrochlorothiazide 160-12.5 MG per tablet  Commonly known as:  DIOVAN HCT      TAKE these medications        amiodarone 200 MG tablet  Commonly known as:  PACERONE  Take 1 tablet (200 mg total) by mouth 2 (two) times daily after a meal.     aspirin 81 MG chewable tablet  Chew 1 tablet (81 mg total) by mouth daily.     atorvastatin 40 MG tablet  Commonly known as:  LIPITOR  TAKE ONE TABLET BY MOUTH ONCE DAILY     BREO ELLIPTA 100-25 MCG/INH Aepb  Generic drug:  Fluticasone Furoate-Vilanterol  INHALE ONE PUFF INTO LUNGS ONCE DAILY IN THE MORNING     CENTRUM SILVER PO  Take 1 tablet by mouth daily.     docusate sodium 100 MG capsule  Commonly known as:  COLACE  Take 100 mg by mouth 2 (two) times daily.     fenofibrate 160 MG tablet  Take 1 tablet (160 mg total) by mouth daily.     furosemide 20 MG tablet  Commonly known as:  LASIX  Take 3 tablets (60 mg total) by mouth 2 (two) times daily.     Ipratropium-Albuterol 20-100 MCG/ACT Aers respimat  Commonly known as:  COMBIVENT RESPIMAT  Inhale 1 puff into the lungs 4 (four) times daily as needed for wheezing.     metoprolol tartrate 25 MG tablet  Commonly known as:  LOPRESSOR  Take 0.5 tablets (12.5 mg total) by mouth 2 (two) times daily.     octreotide 100 MCG/ML Soln injection  Commonly known as:  SANDOSTATIN  Inject 1 mL (100 mcg total) into the skin every 12 (twelve) hours.     oxyCODONE 5 MG immediate release tablet  Commonly known as:  Oxy IR/ROXICODONE  Take 1-2 tablets (5-10 mg total) by mouth every 6 (six) hours as needed for severe pain.     pantoprazole 40 MG tablet  Commonly known as:  PROTONIX  TAKE ONE TABLET BY MOUTH ONCE DAILY     potassium chloride SA 20 MEQ tablet  Commonly known as:  K-DUR,KLOR-CON  Take 1 tablet (20 mEq total) by mouth 2 (two) times daily.     valsartan 160 MG tablet  Commonly known as:  DIOVAN  Take 1 tablet (160 mg total) by mouth daily.      vitamin C 250 MG tablet  Commonly known as:  ASCORBIC ACID  Take 500 mg by mouth daily.       Day of Discharge BP 125/40 mmHg  Pulse 72  Temp(Src) 98.1 F (36.7 C) (Oral)  Resp 17  Ht 5\' 3"  (1.6 m)  Wt 60.4 kg (133 lb 2.5 oz)  BMI 23.59 kg/m2  SpO2 93%  Physical Exam: General: No acute respiratory distress Lungs: Clear to auscultation bilaterally without wheezes or crackles Cardiovascular: Regular rate and rhythm without murmur gallop or rub normal S1 and S2 Abdomen: Nontender, nondistended, soft, bowel sounds positive, no rebound, no ascites, no appreciable mass Extremities: No significant  cyanosis, clubbing, or edema bilateral lower extremities  Basic Metabolic Panel:  Recent Labs Lab 12/05/14 0559 12/06/14 0240 12/07/14 0330 12/08/14 0420 12/09/14 0234  NA 133* 131* 134* 135 133*  K 4.2 4.1 3.4* 3.6 4.0  CL 98* 95* 94* 97* 95*  CO2 30 28 34* 31 30  GLUCOSE 97 107* 103* 125* 87  BUN 6 14 16 14 15   CREATININE 0.67 0.66 0.83 0.80 0.87  CALCIUM 8.7* 8.5* 8.4* 8.8* 8.7*  MG 2.0  --  1.8  --   --     Liver Function Tests:  Recent Labs Lab 12/03/14 2044 12/05/14 0559 12/06/14 0240 12/07/14 0330 12/08/14 0420  AST 28 20 23 18 21   ALT 26 23 22 19 20   ALKPHOS 49 54 59 56 65  BILITOT 0.3 0.7 1.2 0.5 1.1  PROT 5.4* 5.9* 5.6* 5.8* 6.2*  ALBUMIN 2.6* 2.6* 2.6* 2.6* 2.8*   Coags:  Recent Labs Lab 12/03/14 1918  INR 1.27   CBC:  Recent Labs Lab 12/03/14 1918  12/05/14 0559  12/06/14 0728 12/06/14 1345 12/07/14 0330 12/08/14 0420 12/09/14 0234  WBC 15.9*  < > 12.4*  < > 13.6* 14.2* 11.0* 10.6* 8.7  NEUTROABS 12.7*  --  9.9*  --   --   --  8.6*  --   --   HGB 5.1*  < > 7.9*  < > 9.5* 10.5* 9.4* 11.3* 11.4*  HCT 16.1*  < > 24.0*  < > 29.5* 31.6* 28.4* 34.4* 34.8*  MCV 94.2  < > 90.6  < > 87.5 88.0 88.8 87.5 89.2  PLT 733*  < > 588*  < > 535* 589* 485* 488* 457*  < > = values in this interval not displayed.  Cardiac Enzymes:  Recent Labs Lab  12/04/14 1940 12/05/14 0120 12/05/14 0559  TROPONINI 0.04* 0.03 0.03   BNP (last 3 results)  Recent Labs  05/31/14 2351 12/03/14 1919  BNP 75.7 59.7    Recent Results (from the past 240 hour(s))  Culture, body fluid-bottle     Status: None (Preliminary result)   Collection Time: 12/07/14  1:54 PM  Result Value Ref Range Status   Specimen Description FLUID PLEURAL LEFT  Final   Special Requests NONE  Final   Culture NO GROWTH < 24 HOURS  Final   Report Status PENDING  Incomplete  Gram stain     Status: None   Collection Time: 12/07/14  1:54 PM  Result Value Ref Range Status   Specimen Description FLUID PLEURAL LEFT  Final   Special Requests NONE  Final   Gram Stain   Final    FEW WBC PRESENT,BOTH PMN AND MONONUCLEAR NO ORGANISMS SEEN    Report Status 12/07/2014 FINAL  Final     Time spent in discharge (includes decision making & examination of pt): >35 minutes  12/09/2014, 10:51 AM   Cherene Altes, MD [facility] Office  [phone] Pager [phone]  On-Call/Text Page:      [email or web address]      password [facility]

## 2014-12-09 NOTE — Progress Notes (Signed)
Pt discharged home, [facility] transporting. Discharge instructions reviewed with pt and prescriptions given. All questions answered.

## 2014-12-10 ENCOUNTER — Encounter: Payer: Self-pay | Admitting: Gastroenterology

## 2014-12-10 ENCOUNTER — Telehealth: Payer: Self-pay | Admitting: Family Medicine

## 2014-12-10 ENCOUNTER — Other Ambulatory Visit: Payer: Self-pay | Admitting: *Deleted

## 2014-12-10 ENCOUNTER — Telehealth: Payer: Self-pay

## 2014-12-10 ENCOUNTER — Ambulatory Visit: Payer: Medicare Other

## 2014-12-10 ENCOUNTER — Telehealth: Payer: Self-pay | Admitting: *Deleted

## 2014-12-10 DIAGNOSIS — Z87891 Personal history of nicotine dependence: Secondary | ICD-10-CM | POA: Diagnosis not present

## 2014-12-10 DIAGNOSIS — I10 Essential (primary) hypertension: Secondary | ICD-10-CM | POA: Diagnosis not present

## 2014-12-10 DIAGNOSIS — Z9981 Dependence on supplemental oxygen: Secondary | ICD-10-CM | POA: Diagnosis not present

## 2014-12-10 DIAGNOSIS — K5520 Angiodysplasia of colon without hemorrhage: Secondary | ICD-10-CM | POA: Diagnosis not present

## 2014-12-10 DIAGNOSIS — Z955 Presence of coronary angioplasty implant and graft: Secondary | ICD-10-CM | POA: Diagnosis not present

## 2014-12-10 DIAGNOSIS — E785 Hyperlipidemia, unspecified: Secondary | ICD-10-CM | POA: Diagnosis not present

## 2014-12-10 DIAGNOSIS — Z86010 Personal history of colonic polyps: Secondary | ICD-10-CM | POA: Diagnosis not present

## 2014-12-10 DIAGNOSIS — J61 Pneumoconiosis due to asbestos and other mineral fibers: Secondary | ICD-10-CM | POA: Diagnosis not present

## 2014-12-10 DIAGNOSIS — Z952 Presence of prosthetic heart valve: Secondary | ICD-10-CM | POA: Diagnosis not present

## 2014-12-10 DIAGNOSIS — Z7982 Long term (current) use of aspirin: Secondary | ICD-10-CM | POA: Diagnosis not present

## 2014-12-10 DIAGNOSIS — I2510 Atherosclerotic heart disease of native coronary artery without angina pectoris: Secondary | ICD-10-CM | POA: Diagnosis not present

## 2014-12-10 DIAGNOSIS — I739 Peripheral vascular disease, unspecified: Secondary | ICD-10-CM | POA: Diagnosis not present

## 2014-12-10 DIAGNOSIS — J439 Emphysema, unspecified: Secondary | ICD-10-CM | POA: Diagnosis not present

## 2014-12-10 DIAGNOSIS — F419 Anxiety disorder, unspecified: Secondary | ICD-10-CM | POA: Diagnosis not present

## 2014-12-10 MED ORDER — AMIODARONE HCL 200 MG PO TABS: 200.0000 mg | ORAL_TABLET | Freq: Two times a day (BID) | ORAL | Status: DC

## 2014-12-10 NOTE — Telephone Encounter (Signed)
Cardiology rx amiodarone

## 2014-12-10 NOTE — Telephone Encounter (Signed)
Please advise if this refill is appropriate.     KP 

## 2014-12-10 NOTE — Telephone Encounter (Signed)
Primary cardiologist is Dr.Skains. I reviewed with Kelli Churn, RN and OK to refill with Dr. Marlou Porch as ordering provider with note for pt to contact office to schedule appt. Fill with 2 refills. Amiodarone dose is 200 mg by mouth twice daily

## 2014-12-10 NOTE — Telephone Encounter (Signed)
Tests Needing Follow-up: -The patient is instructed that if the cost of subcutaneous octreotide is prohibitive he may opt to not use this medication at home - if however he is able to afford it the medical team does feel it will likely be helpful in reducing the incidence of further GI bleeding -if pt tolerates and is not having further GIB on short acting SQ octreotide, he should be initiated on depo octreotide in 2 weeks time (roughly Oct 1) -f/u CBC should be accomplished in 5-7 days   Transition Care Management Follow-up Telephone Call   Date discharged? 12/09/14   How have you been since you were released from the hospital? Good- patient had a BM and it looked normal    Do you understand why you were in the hospital? Yes    Do you understand the discharge instructions? yes   Where were you discharged to? Home    Items Reviewed:  Medications reviewed: no- patient refused, he is concerned about getting his amiodarone   Allergies reviewed: yes  Dietary changes reviewed: no- patient does not wish to answer these questions   Referrals reviewed: yes   Functional Questionnaire:   Activities of Daily Living (ADLs):   He states they are independent in the following: Patient states "I have all the help I need" States they require assistance with the following:    Any transportation issues/concerns?: no   Any patient concerns? Yes- patient was discharged on amiodarone, but not given the prescription.  He requested from Dr. Etter Sjogren, who stated that cardiology will be in charge of this medication.  Called Dr. Camillia Herter office and spoke to nurse who states she will pass along the message for his approval, and it should not be a problem.  Notified patient.       Confirmed importance and date/time of follow-up visits scheduled yes 12/17/14  Lynnix Schoneman Appointment booked with Dr. Etter Sjogren   Confirmed with patient if condition begins to worsen call PCP or go to the ER.  Patient was  given the office number and encouraged to call back with question or concerns.  : yes

## 2014-12-10 NOTE — Telephone Encounter (Signed)
Virgilio Frees made the patient aware.    KP

## 2014-12-10 NOTE — Telephone Encounter (Signed)
Caller name: Corporate treasurer with [employer] Can be reached:[phone] Pharmacy: [facility], [place] [address]  Reason for call: Diane stated MD at the hospital ordered amiodarone 200mg  2xday. Pt dc from [facility] 12/09/14. She is requesting RX sent in for pt.

## 2014-12-10 NOTE — Telephone Encounter (Signed)
[name], RN at 12/10/2014 2:31 PM     Status: Signed       Expand All Collapse All   Primary cardiologist is Dr.Skains. I reviewed with Kelli Churn, RN and OK to refill with Dr. Marlou Porch as ordering Jerric Oyen with note for pt to contact office to schedule appt. Fill with 2 refills. Amiodarone dose is 200 mg by mouth twice daily

## 2014-12-10 NOTE — Telephone Encounter (Signed)
Caller name: Jyron Turman Relationship to patient: Self   Can be reached: [phone]  Pharmacy: [facility] [place], [place]  Reason for call: pt got out of the hospital yesterday. He says that he was given amiodarone to take but pt says that he doesn't have any. He says that he need a Rx for it.

## 2014-12-11 NOTE — Anesthesia Postprocedure Evaluation (Signed)
  Anesthesia Post-op Note  Patient: Ronald Bell  Procedure(s) Performed: Procedure(s): COLONOSCOPY (N/A) ENTEROSCOPY (N/A)  Patient Location: Endoscopy Unit  Anesthesia Type:MAC  Level of Consciousness: awake  Airway and Oxygen Therapy: Patient Spontanous Breathing and Patient connected to nasal cannula oxygen  Post-op Pain: none  Post-op Assessment: Post-op Vital signs reviewed, Patient's Cardiovascular Status Stable, Respiratory Function Stable, Patent Airway, No signs of Nausea or vomiting and Pain level controlled              Post-op Vital Signs: Reviewed and stable  Last Vitals:  Filed Vitals:   12/09/14 0800  BP: 125/40  Pulse: 72  Temp: 36.7 C  Resp: 17    Complications: No apparent anesthesia complications

## 2014-12-12 LAB — CULTURE, BODY FLUID-BOTTLE: CULTURE: NO GROWTH

## 2014-12-13 ENCOUNTER — Telehealth: Payer: Self-pay | Admitting: Family Medicine

## 2014-12-13 ENCOUNTER — Other Ambulatory Visit: Payer: Self-pay | Admitting: Family Medicine

## 2014-12-13 DIAGNOSIS — G4700 Insomnia, unspecified: Secondary | ICD-10-CM

## 2014-12-13 DIAGNOSIS — I739 Peripheral vascular disease, unspecified: Secondary | ICD-10-CM | POA: Diagnosis not present

## 2014-12-13 DIAGNOSIS — I2510 Atherosclerotic heart disease of native coronary artery without angina pectoris: Secondary | ICD-10-CM | POA: Diagnosis not present

## 2014-12-13 DIAGNOSIS — J439 Emphysema, unspecified: Secondary | ICD-10-CM | POA: Diagnosis not present

## 2014-12-13 DIAGNOSIS — I10 Essential (primary) hypertension: Secondary | ICD-10-CM | POA: Diagnosis not present

## 2014-12-13 DIAGNOSIS — J61 Pneumoconiosis due to asbestos and other mineral fibers: Secondary | ICD-10-CM | POA: Diagnosis not present

## 2014-12-13 DIAGNOSIS — K5520 Angiodysplasia of colon without hemorrhage: Secondary | ICD-10-CM | POA: Diagnosis not present

## 2014-12-13 MED ORDER — TEMAZEPAM 15 MG PO CAPS: 15.0000 mg | ORAL_CAPSULE | Freq: Every evening | ORAL | Status: DC | PRN

## 2014-12-13 NOTE — Telephone Encounter (Signed)
restoril sent to pharmacy

## 2014-12-13 NOTE — Telephone Encounter (Signed)
Please advise      KP 

## 2014-12-13 NOTE — Telephone Encounter (Signed)
Pt called stating he has not slept in 4 days since being home from hospital. He said he was taking something there to help him sleep but he doesn't remember the name of it. Pt is requesting something prior to his hospital f/u appt 12/17/14. Call home #.

## 2014-12-14 DIAGNOSIS — J439 Emphysema, unspecified: Secondary | ICD-10-CM | POA: Diagnosis not present

## 2014-12-14 DIAGNOSIS — I10 Essential (primary) hypertension: Secondary | ICD-10-CM | POA: Diagnosis not present

## 2014-12-14 DIAGNOSIS — I739 Peripheral vascular disease, unspecified: Secondary | ICD-10-CM | POA: Diagnosis not present

## 2014-12-14 DIAGNOSIS — I2510 Atherosclerotic heart disease of native coronary artery without angina pectoris: Secondary | ICD-10-CM | POA: Diagnosis not present

## 2014-12-14 DIAGNOSIS — K5520 Angiodysplasia of colon without hemorrhage: Secondary | ICD-10-CM | POA: Diagnosis not present

## 2014-12-14 DIAGNOSIS — J61 Pneumoconiosis due to asbestos and other mineral fibers: Secondary | ICD-10-CM | POA: Diagnosis not present

## 2014-12-17 ENCOUNTER — Encounter: Payer: Self-pay | Admitting: Family Medicine

## 2014-12-17 ENCOUNTER — Telehealth: Payer: Self-pay | Admitting: Behavioral Health

## 2014-12-17 ENCOUNTER — Ambulatory Visit (INDEPENDENT_AMBULATORY_CARE_PROVIDER_SITE_OTHER): Payer: Medicare Other | Admitting: Family Medicine

## 2014-12-17 VITALS — BP 95/58 | HR 70 | Temp 97.5°F | Ht 63.0 in | Wt 136.4 lb

## 2014-12-17 DIAGNOSIS — K921 Melena: Secondary | ICD-10-CM | POA: Diagnosis not present

## 2014-12-17 DIAGNOSIS — Z23 Encounter for immunization: Secondary | ICD-10-CM

## 2014-12-17 DIAGNOSIS — E785 Hyperlipidemia, unspecified: Secondary | ICD-10-CM

## 2014-12-17 DIAGNOSIS — I10 Essential (primary) hypertension: Secondary | ICD-10-CM

## 2014-12-17 DIAGNOSIS — F4321 Adjustment disorder with depressed mood: Secondary | ICD-10-CM | POA: Diagnosis not present

## 2014-12-17 LAB — CBC WITH DIFFERENTIAL/PLATELET
BASOS ABS: 0.1 10*3/uL (ref 0.0–0.1)
BASOS PCT: 0.5 % (ref 0.0–3.0)
Eosinophils Absolute: 0.3 10*3/uL (ref 0.0–0.7)
Eosinophils Relative: 2.8 % (ref 0.0–5.0)
HEMATOCRIT: 35.7 % — AB (ref 39.0–52.0)
HEMOGLOBIN: 11.5 g/dL — AB (ref 13.0–17.0)
LYMPHS PCT: 4.1 % — AB (ref 12.0–46.0)
Lymphs Abs: 0.5 10*3/uL — ABNORMAL LOW (ref 0.7–4.0)
MCHC: 32.2 g/dL (ref 30.0–36.0)
MCV: 89.5 fl (ref 78.0–100.0)
MONOS PCT: 9.3 % (ref 3.0–12.0)
Monocytes Absolute: 1.1 10*3/uL — ABNORMAL HIGH (ref 0.1–1.0)
Neutro Abs: 10 10*3/uL — ABNORMAL HIGH (ref 1.4–7.7)
Neutrophils Relative %: 83.3 % — ABNORMAL HIGH (ref 43.0–77.0)
Platelets: 414 10*3/uL — ABNORMAL HIGH (ref 150.0–400.0)
RBC: 3.98 Mil/uL — ABNORMAL LOW (ref 4.22–5.81)
RDW: 16.4 % — AB (ref 11.5–15.5)
WBC: 12 10*3/uL — AB (ref 4.0–10.5)

## 2014-12-17 LAB — LIPID PANEL
CHOLESTEROL: 155 mg/dL (ref 0–200)
HDL: 49.4 mg/dL (ref >39.00)
LDL Cholesterol: 80 mg/dL (ref 0–99)
NonHDL: 105.78
TRIGLYCERIDES: 128 mg/dL (ref 0.0–149.0)
Total CHOL/HDL Ratio: 3
VLDL: 25.6 mg/dL (ref 0.0–40.0)

## 2014-12-17 LAB — COMPREHENSIVE METABOLIC PANEL
ALBUMIN: 3.8 g/dL (ref 3.5–5.2)
ALK PHOS: 62 U/L (ref 39–117)
ALT: 20 U/L (ref 0–53)
AST: 21 U/L (ref 0–37)
BILIRUBIN TOTAL: 0.4 mg/dL (ref 0.2–1.2)
BUN: 29 mg/dL — ABNORMAL HIGH (ref 6–23)
CALCIUM: 10.2 mg/dL (ref 8.4–10.5)
CO2: 32 mEq/L (ref 19–32)
CREATININE: 1.4 mg/dL (ref 0.40–1.50)
Chloride: 93 mEq/L — ABNORMAL LOW (ref 96–112)
GFR: 52.32 mL/min — ABNORMAL LOW (ref >60.00)
Glucose, Bld: 96 mg/dL (ref 70–99)
Potassium: 4.5 mEq/L (ref 3.5–5.1)
Sodium: 134 mEq/L — ABNORMAL LOW (ref 135–145)
Total Protein: 7.3 g/dL (ref 6.0–8.3)

## 2014-12-17 MED ORDER — SERTRALINE HCL 50 MG PO TABS: 50.0000 mg | ORAL_TABLET | Freq: Every day | ORAL | Status: DC

## 2014-12-17 MED ORDER — VALSARTAN 80 MG PO TABS: 80.0000 mg | ORAL_TABLET | Freq: Every day | ORAL | Status: DC

## 2014-12-17 MED ORDER — ALPRAZOLAM 0.25 MG PO TABS: 0.2500 mg | ORAL_TABLET | Freq: Two times a day (BID) | ORAL | Status: DC | PRN

## 2014-12-17 NOTE — Telephone Encounter (Signed)
-----   Message from Rosalita Chessman, DO sent at 12/17/2014  1:03 PM EDT ----- I forgot to tell mr Soule that I decreased the dose of diovan because his bp was so low..... Would you call him and tell him please,  Thank you

## 2014-12-17 NOTE — Patient Instructions (Signed)

## 2014-12-17 NOTE — Telephone Encounter (Signed)
LMOVM advising the patient that his blood pressure was low at the visit today and the provider has ordered for him to now take 80 mg of Diovan and the new prescription has been sent to his preferred pharmacy.

## 2014-12-17 NOTE — Progress Notes (Signed)
Patient ID: Ronald Bell, male    DOB: 12/22/1940  Age: 74 y.o. MRN: [id number]    Subjective:  Subjective HPI Marty W Bernardi presents for f/u from hospital for GI bleed.   Pt was in 9/16- 9/18.  He was sent to er from rehab.  He went to rehab after his heart surgery.   Day before d/c his hgb dropped so he was brought to the ER;.  A colonoscopy was done avms were found and cauterized.  EGD was also done.  Cardiology and GI were consulted.   He was transfused with 5 units PRBCs.    Review of Systems  Constitutional: Negative for diaphoresis, appetite change, fatigue and unexpected weight change.  Eyes: Negative for pain, redness and visual disturbance.  Respiratory: Negative for cough, chest tightness, shortness of breath and wheezing.   Cardiovascular: Negative for chest pain, palpitations and leg swelling.  Endocrine: Negative for cold intolerance, heat intolerance, polydipsia, polyphagia and polyuria.  Genitourinary: Negative for dysuria, frequency and difficulty urinating.  Neurological: Negative for dizziness, light-headedness, numbness and headaches.  All other systems reviewed and are negative.   History Past Medical History  Diagnosis Date  . Polyp of nasal cavity   . Rosacea   . Hx of adenomatous colonic polyps 2012, 2013.   . Tobacco abuse   . Hypertension   . Hyperlipidemia   . GERD (gastroesophageal reflux disease)   . GI bleed 2010    4 units PRBCs  . Peripheral vascular disease   . Irregular heartbeat   . Shingles   . Carotid artery occlusion   . Angiodysplasia of intestine with hemorrhage     large and SB, gastric AVMs.   . Pleural plaque with presence of asbestos 03/27/2013    Followed in Pulmonary clinic/ [facility]/ [place] - F/u CT 09/08/2013 1. Stable extensive calcified pleural plaque formation consistent with asbestos exposure. 2. Multiple pulmonary nodules are unchanged from the CT of 6 months ago. Given risk factors for lung cancer, continued  follow up is recommended with chest CT in 6 months> done 04/20/14 no change >repeat in 12 m in tickle file     . GERD 11/30/2008    Qualifier: Diagnosis of  By: Barmer CMA, Danielle    . PVD (peripheral vascular disease) 10/18/2012  . Carotid artery stenosis 04/22/2012  . Coronary artery disease   . COPD GOLD III with min reversibilty  08/11/2006    Followed in Pulmonary clinic/ [facility]/ [place] - PFT's 04/28/2013  FEV1 0.88 (40%) with ratio 44 and 14% better p B2 dlco 45 corrects to 83 - Trial of breo 04/28/2013 > improved symptoms  06/09/2013  - spirometry 06/04/2014 FEV1  0.76 (29%) ratio 45      . Iron deficiency anemia 01/25/2009    Qualifier: Diagnosis of  By: Perry MD, John N   . History of blood transfusion "couple times"    "related to bleeding in colon and esophagus"  . Arthritis     "left shoulder" (10/19/2014)  . Anxiety   . 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    26 mm Edwards Sapien XT transcatheter heart valve placed via transapical approach    He has past surgical history that includes Iliac artery stent (Left, 2005); Esophagogastroduodenoscopy (2012); Colonoscopy (July 2015); Tonsillectomy; Knee arthroscopy with medial menisectomy (Left, 03/08/2014); left and right heart catheterization with coronary angiogram (N/A, 06/28/2014); Esophagogastroduodenoscopy (N/A, 08/17/2014); Colonoscopy (N/A, 08/17/2014); Cardiac catheterization (2001; 06/28/2014); Cardiac catheterization (N/A, 10/19/2014); Transcatheter aortic valve   replacement, transapical (N/A, 11/20/2014); TEE without cardioversion (N/A, 11/20/2014); Rib plating (Left, 11/20/2014); Colonoscopy (N/A, 12/05/2014); and enteroscopy (N/A, 12/05/2014).   His family history includes Colitis in his father; Heart disease in his brother and daughter; Hyperlipidemia in his brother; Hypertension in his brother and son; Lung disease in his mother; Uterine cancer in an other family member. There is no history of Colon cancer.He  reports that he quit smoking about 8 months ago. His smoking use included Cigarettes and E-cigarettes. He has a 60 pack-year smoking history. He has never used smokeless tobacco. He reports that he drinks about 8.4 oz of alcohol per week. He reports that he does not use illicit drugs.  Current Outpatient Prescriptions on File Prior to Visit  Medication Sig Dispense Refill  . amiodarone (PACERONE) 200 MG tablet Take 1 tablet (200 mg total) by mouth 2 (two) times daily after a meal. 60 tablet 2  . Ascorbic Acid (VITAMIN C) 250 MG tablet Take 500 mg by mouth daily.     Marland Kitchen aspirin 81 MG chewable tablet Chew 1 tablet (81 mg total) by mouth daily.    Marland Kitchen atorvastatin (LIPITOR) 40 MG tablet TAKE ONE TABLET BY MOUTH ONCE DAILY (Patient taking differently: TAKE 40 MG BY MOUTH ONCE DAILY) 90 tablet 0  . BREO ELLIPTA 100-25 MCG/INH AEPB INHALE ONE PUFF INTO LUNGS ONCE DAILY IN THE MORNING 60 each 5  . docusate sodium (COLACE) 100 MG capsule Take 100 mg by mouth 2 (two) times daily.     . fenofibrate 160 MG tablet Take 1 tablet (160 mg total) by mouth daily. 30 tablet 2  . furosemide (LASIX) 20 MG tablet Take 3 tablets (60 mg total) by mouth 2 (two) times daily. 60 tablet 0  . Ipratropium-Albuterol (COMBIVENT RESPIMAT) 20-100 MCG/ACT AERS respimat Inhale 1 puff into the lungs 4 (four) times daily as needed for wheezing. 1 Inhaler 3  . metoprolol tartrate (LOPRESSOR) 25 MG tablet Take 0.5 tablets (12.5 mg total) by mouth 2 (two) times daily. 60 tablet 6  . Multiple Vitamins-Minerals (CENTRUM SILVER PO) Take 1 tablet by mouth daily.     . pantoprazole (PROTONIX) 40 MG tablet TAKE ONE TABLET BY MOUTH ONCE DAILY (Patient taking differently: Take 40 mg by mouth daily. ) 90 tablet 3  . potassium chloride SA (K-DUR,KLOR-CON) 20 MEQ tablet Take 1 tablet (20 mEq total) by mouth 2 (two) times daily. 60 tablet 0  . temazepam (RESTORIL) 15 MG capsule Take 1 capsule (15 mg total) by mouth at bedtime as needed for sleep. 30  capsule 0   No current facility-administered medications on file prior to visit.     Objective:  Objective Physical Exam  Constitutional: He is oriented to person, place, and time. Vital signs are normal. He appears well-developed and well-nourished. He is sleeping.  HENT:  Head: Normocephalic and atraumatic.  Mouth/Throat: Oropharynx is clear and moist.  Eyes: EOM are normal. Pupils are equal, round, and reactive to light.  Neck: Normal range of motion. Neck supple. No thyromegaly present.  Cardiovascular: Normal rate and regular rhythm.   No murmur heard. Pulmonary/Chest: Effort normal. No respiratory distress. He has decreased breath sounds. He has no wheezes. He has no rales. He exhibits no tenderness.  Musculoskeletal: He exhibits no edema or tenderness.  Neurological: He is alert and oriented to person, place, and time.  Skin: Skin is warm and dry.  Psychiatric: He has a normal mood and affect. His behavior is normal. Judgment and thought content normal.  BP 95/58 mmHg  Pulse 70  Temp(Src) 97.5 F (36.4 C) (Oral)  Ht 5' 3" (1.6 m)  Wt 136 lb 6.4 oz (61.871 kg)  BMI 24.17 kg/m2  SpO2 98% Wt Readings from Last 3 Encounters:  12/17/14 136 lb 6.4 oz (61.871 kg)  12/09/14 133 lb 2.5 oz (60.4 kg)  11/30/14 145 lb 12.8 oz (66.134 kg)     Lab Results  Component Value Date   WBC 12.0* 12/17/2014   HGB 11.5* 12/17/2014   HCT 35.7* 12/17/2014   PLT 414.0* 12/17/2014   GLUCOSE 96 12/17/2014   CHOL 155 12/17/2014   TRIG 128.0 12/17/2014   HDL 49.40 12/17/2014   LDLCALC 80 12/17/2014   ALT 20 12/17/2014   AST 21 12/17/2014   NA 134* 12/17/2014   K 4.5 12/17/2014   CL 93* 12/17/2014   CREATININE 1.40 12/17/2014   BUN 29* 12/17/2014   CO2 32 12/17/2014   TSH 1.206 06/01/2014   PSA 1.47 05/10/2014   INR 1.27 12/03/2014   HGBA1C 6.1* 11/16/2014   MICROALBUR 1.1 03/05/2014    Dg Chest 2 View  12/03/2014   CLINICAL DATA:  Dark stools onset 4 days ago. Shortness of  breath and hypoxia. Aortic valve replacement 11/20/2014.  EXAM: CHEST  2 VIEW  COMPARISON:  11/23/2014, 05/31/2014 and chest CT 07/09/2014  FINDINGS: Lungs are adequately inflated and demonstrate worsening moderate size left pleural effusion likely with associated atelectasis in the left base. Cannot completely exclude infection in the left base. Evidence of known bilateral calcified pleural plaques. Cardiomediastinal silhouette is unchanged. There is calcified plaque over the thoracoabdominal aorta. Prosthetic aortic valve unchanged. Remainder of the exam is unchanged.  IMPRESSION: Worsening moderate size left pleural effusion likely with associated atelectasis. Cannot exclude infection in the left base.  Stable asbestos related pleural disease.   Electronically Signed   By: Daniel  Boyle M.D.   On: 12/03/2014 19:55     Assessment & Plan:  Plan I have discontinued Mr. Sides's oxyCODONE, octreotide, and valsartan. I am also having him start on sertraline, ALPRAZolam, and valsartan. Additionally, I am having him maintain his Multiple Vitamins-Minerals (CENTRUM SILVER PO), vitamin C, Ipratropium-Albuterol, docusate sodium, metoprolol tartrate, pantoprazole, BREO ELLIPTA, atorvastatin, aspirin, fenofibrate, furosemide, potassium chloride SA, amiodarone, and temazepam.  Meds ordered this encounter  Medications  . sertraline (ZOLOFT) 50 MG tablet    Sig: Take 1 tablet (50 mg total) by mouth daily.    Dispense:  30 tablet    Refill:  3  . ALPRAZolam (XANAX) 0.25 MG tablet    Sig: Take 1 tablet (0.25 mg total) by mouth 2 (two) times daily as needed for anxiety.    Dispense:  20 tablet    Refill:  0  . valsartan (DIOVAN) 80 MG tablet    Sig: Take 1 tablet (80 mg total) by mouth daily.    Dispense:  30 tablet    Refill:  2    Problem List Items Addressed This Visit    Hyperlipidemia   Relevant Medications   valsartan (DIOVAN) 80 MG tablet   Other Relevant Orders   Comp Met (CMET) (Completed)     Lipid panel (Completed)   Essential hypertension (Chronic)   Relevant Medications   valsartan (DIOVAN) 80 MG tablet    Other Visit Diagnoses    Gastrointestinal hemorrhage with melena    -  Primary    Relevant Orders    Ambulatory referral to Gastroenterology    CBC with Differential/Platelet (Completed)      Adjustment disorder with depressed mood        Relevant Medications    sertraline (ZOLOFT) 50 MG tablet    ALPRAZolam (XANAX) 0.25 MG tablet    Influenza vaccine needed        Relevant Orders    Flu vaccine HIGH DOSE PF (Fluzone High dose) (Completed)       Follow-up: Return in about 3 months (around 03/18/2015), or if symptoms worsen or fail to improve, for fasting, hypertension, hyperlipidemia.  [name], DO

## 2014-12-17 NOTE — Progress Notes (Signed)
Pre visit review using our clinic review tool, if applicable. No additional management support is needed unless otherwise documented below in the visit note. 

## 2014-12-18 NOTE — Telephone Encounter (Signed)
Patient's wife informed Probation officer that the below prescription has been picked up from the pharmacy. No questions or concerns prior to the  end of call.

## 2014-12-19 DIAGNOSIS — I739 Peripheral vascular disease, unspecified: Secondary | ICD-10-CM | POA: Diagnosis not present

## 2014-12-19 DIAGNOSIS — J439 Emphysema, unspecified: Secondary | ICD-10-CM | POA: Diagnosis not present

## 2014-12-19 DIAGNOSIS — I2510 Atherosclerotic heart disease of native coronary artery without angina pectoris: Secondary | ICD-10-CM | POA: Diagnosis not present

## 2014-12-19 DIAGNOSIS — I10 Essential (primary) hypertension: Secondary | ICD-10-CM | POA: Diagnosis not present

## 2014-12-19 DIAGNOSIS — J61 Pneumoconiosis due to asbestos and other mineral fibers: Secondary | ICD-10-CM | POA: Diagnosis not present

## 2014-12-19 DIAGNOSIS — K5520 Angiodysplasia of colon without hemorrhage: Secondary | ICD-10-CM | POA: Diagnosis not present

## 2014-12-21 ENCOUNTER — Other Ambulatory Visit: Payer: Self-pay | Admitting: Thoracic Surgery (Cardiothoracic Vascular Surgery)

## 2014-12-21 DIAGNOSIS — J439 Emphysema, unspecified: Secondary | ICD-10-CM | POA: Diagnosis not present

## 2014-12-21 DIAGNOSIS — J61 Pneumoconiosis due to asbestos and other mineral fibers: Secondary | ICD-10-CM | POA: Diagnosis not present

## 2014-12-21 DIAGNOSIS — Z952 Presence of prosthetic heart valve: Secondary | ICD-10-CM

## 2014-12-21 DIAGNOSIS — I739 Peripheral vascular disease, unspecified: Secondary | ICD-10-CM | POA: Diagnosis not present

## 2014-12-21 DIAGNOSIS — I10 Essential (primary) hypertension: Secondary | ICD-10-CM | POA: Diagnosis not present

## 2014-12-21 DIAGNOSIS — I2510 Atherosclerotic heart disease of native coronary artery without angina pectoris: Secondary | ICD-10-CM | POA: Diagnosis not present

## 2014-12-21 DIAGNOSIS — K5520 Angiodysplasia of colon without hemorrhage: Secondary | ICD-10-CM | POA: Diagnosis not present

## 2014-12-24 ENCOUNTER — Ambulatory Visit (INDEPENDENT_AMBULATORY_CARE_PROVIDER_SITE_OTHER): Payer: Medicare Other | Admitting: Thoracic Surgery (Cardiothoracic Vascular Surgery)

## 2014-12-24 ENCOUNTER — Other Ambulatory Visit (INDEPENDENT_AMBULATORY_CARE_PROVIDER_SITE_OTHER): Payer: Medicare Other

## 2014-12-24 ENCOUNTER — Encounter: Payer: Self-pay | Admitting: Internal Medicine

## 2014-12-24 ENCOUNTER — Encounter: Payer: Self-pay | Admitting: Thoracic Surgery (Cardiothoracic Vascular Surgery)

## 2014-12-24 ENCOUNTER — Ambulatory Visit (HOSPITAL_COMMUNITY)
Admission: RE | Admit: 2014-12-24 | Discharge: 2014-12-24 | Disposition: A | Discharge status: Home / Self Care | Payer: Medicare Other | Source: Ambulatory Visit | Attending: Cardiovascular Disease | Admitting: Cardiovascular Disease

## 2014-12-24 ENCOUNTER — Other Ambulatory Visit: Payer: Self-pay

## 2014-12-24 ENCOUNTER — Ambulatory Visit (INDEPENDENT_AMBULATORY_CARE_PROVIDER_SITE_OTHER): Payer: Medicare Other | Admitting: Internal Medicine

## 2014-12-24 ENCOUNTER — Ambulatory Visit
Admission: RE | Admit: 2014-12-24 | Discharge: 2014-12-24 | Disposition: A | Discharge status: Home / Self Care | Payer: Medicare Other | Source: Ambulatory Visit | Attending: Thoracic Surgery (Cardiothoracic Vascular Surgery) | Admitting: Thoracic Surgery (Cardiothoracic Vascular Surgery)

## 2014-12-24 ENCOUNTER — Other Ambulatory Visit: Payer: Self-pay | Admitting: *Deleted

## 2014-12-24 VITALS — BP 140/60 | HR 80 | Ht 63.0 in | Wt 138.0 lb

## 2014-12-24 VITALS — BP 142/63 | HR 70 | Resp 24 | Ht 63.0 in | Wt 138.0 lb

## 2014-12-24 DIAGNOSIS — I350 Nonrheumatic aortic (valve) stenosis: Secondary | ICD-10-CM

## 2014-12-24 DIAGNOSIS — K5520 Angiodysplasia of colon without hemorrhage: Secondary | ICD-10-CM | POA: Diagnosis not present

## 2014-12-24 DIAGNOSIS — Z953 Presence of xenogenic heart valve: Secondary | ICD-10-CM

## 2014-12-24 DIAGNOSIS — D62 Acute posthemorrhagic anemia: Secondary | ICD-10-CM

## 2014-12-24 DIAGNOSIS — E785 Hyperlipidemia, unspecified: Secondary | ICD-10-CM | POA: Insufficient documentation

## 2014-12-24 DIAGNOSIS — I2510 Atherosclerotic heart disease of native coronary artery without angina pectoris: Secondary | ICD-10-CM

## 2014-12-24 DIAGNOSIS — Z952 Presence of prosthetic heart valve: Secondary | ICD-10-CM | POA: Diagnosis not present

## 2014-12-24 DIAGNOSIS — Z954 Presence of other heart-valve replacement: Secondary | ICD-10-CM

## 2014-12-24 DIAGNOSIS — J90 Pleural effusion, not elsewhere classified: Secondary | ICD-10-CM

## 2014-12-24 DIAGNOSIS — I359 Nonrheumatic aortic valve disorder, unspecified: Secondary | ICD-10-CM | POA: Diagnosis not present

## 2014-12-24 DIAGNOSIS — I10 Essential (primary) hypertension: Secondary | ICD-10-CM | POA: Insufficient documentation

## 2014-12-24 DIAGNOSIS — D509 Iron deficiency anemia, unspecified: Secondary | ICD-10-CM | POA: Diagnosis not present

## 2014-12-24 DIAGNOSIS — I517 Cardiomegaly: Secondary | ICD-10-CM | POA: Diagnosis not present

## 2014-12-24 DIAGNOSIS — R0602 Shortness of breath: Secondary | ICD-10-CM | POA: Diagnosis not present

## 2014-12-24 LAB — CBC WITH DIFFERENTIAL/PLATELET
BASOS PCT: 0.2 % (ref 0.0–3.0)
Basophils Absolute: 0 10*3/uL (ref 0.0–0.1)
Eosinophils Absolute: 0.1 10*3/uL (ref 0.0–0.7)
Eosinophils Relative: 1.1 % (ref 0.0–5.0)
HEMATOCRIT: 36 % — AB (ref 39.0–52.0)
Hemoglobin: 11.5 g/dL — ABNORMAL LOW (ref 13.0–17.0)
LYMPHS PCT: 4.8 % — AB (ref 12.0–46.0)
Lymphs Abs: 0.6 10*3/uL — ABNORMAL LOW (ref 0.7–4.0)
MCHC: 31.9 g/dL (ref 30.0–36.0)
MCV: 88.6 fl (ref 78.0–100.0)
MONOS PCT: 7.7 % (ref 3.0–12.0)
Monocytes Absolute: 0.9 10*3/uL (ref 0.1–1.0)
NEUTROS ABS: 9.7 10*3/uL — AB (ref 1.4–7.7)
Neutrophils Relative %: 85.7 % — ABNORMAL HIGH (ref 43.0–77.0)
PLATELETS: 427 10*3/uL — AB (ref 150.0–400.0)
RBC: 4.06 Mil/uL — ABNORMAL LOW (ref 4.22–5.81)
RDW: 16.4 % — AB (ref 11.5–15.5)
WBC: 11.4 10*3/uL — ABNORMAL HIGH (ref 4.0–10.5)

## 2014-12-24 MED ORDER — AMIODARONE HCL 200 MG PO TABS: 200.0000 mg | ORAL_TABLET | Freq: Every day | ORAL | Status: DC

## 2014-12-24 MED ORDER — FERROUS SULFATE 325 (65 FE) MG PO TBEC: 325.0000 mg | DELAYED_RELEASE_TABLET | Freq: Two times a day (BID) | ORAL | Status: DC

## 2014-12-24 MED ORDER — POTASSIUM CHLORIDE CRYS ER 20 MEQ PO TBCR: 20.0000 meq | EXTENDED_RELEASE_TABLET | Freq: Every day | ORAL | Status: DC

## 2014-12-24 MED ORDER — FUROSEMIDE 20 MG PO TABS: 40.0000 mg | ORAL_TABLET | Freq: Every day | ORAL | Status: DC

## 2014-12-24 NOTE — Patient Instructions (Signed)
Your physician has requested that you go to the basement for the following lab work before leaving today:  CBC    We have sent the following medications to your pharmacy for you to pick up at your convenience: ferrous sulfate

## 2014-12-24 NOTE — Progress Notes (Signed)
HISTORY OF PRESENT ILLNESS:  Ronald Bell is a 74 y.o. male with MULTIPLE SIGNIFICANT medical problems as listed below. Known to me for problems with recurrent iron deficiency anemia secondary to gastrointestinal AVMs and adenomatous colon polyps. Recent problems with severe aortic stenosis. Over the past few months he's had cardiac stent placement and TAVR. Admitted to the hospital early September with symptomatic anemia (hemoglobin 5.1). Had been on aspirin and Plavix. He was transfused. Colonoscopy and push enteroscopy with Dr. Havery Moros performed 12/05/2014. Gastric, small intestinal, and colonic AVMs ablated. Now on aspirin but no Plavix per cardiology. Review of outside blood work finds hemoglobin 12/09/2014 11.4. The patient is having normal-colored regular bowel movements. No GI complaints. Significantly debilitated and required oxygen for minimal exertion shortness of breath. He had been on iron previously, but not currently. This appointment made for GI follow-up.  REVIEW OF SYSTEMS:  All non-GI ROS negative except for arthritis, fatigue, shortness of breath, insomnia  Past Medical History  Diagnosis Date  . Polyp of nasal cavity   . Rosacea   . Hx of adenomatous colonic polyps 2012, 2013.   . Tobacco abuse   . Hypertension   . Hyperlipidemia   . GERD (gastroesophageal reflux disease)   . GI bleed 2010    4 units PRBCs  . Peripheral vascular disease ([place])   . Irregular heartbeat   . Shingles   . Carotid artery occlusion   . Angiodysplasia of intestine with hemorrhage     large and SB, gastric AVMs.   . Pleural plaque with presence of asbestos 03/27/2013    Followed in Pulmonary clinic/ [facility]/ [place] - F/u CT 09/08/2013 1. Stable extensive calcified pleural plaque formation consistent with asbestos exposure. 2. Multiple pulmonary nodules are unchanged from the CT of 6 months ago. Given risk factors for lung cancer, continued follow up is recommended with chest CT in  6 months> done 04/20/14 no change >repeat in 12 m in tickle file     . GERD 11/30/2008    Qualifier: Diagnosis of  By: Marijean Niemann CMA, Danielle    . PVD (peripheral vascular disease) ([place]) 10/18/2012  . Carotid artery stenosis 04/22/2012  . Coronary artery disease   . COPD GOLD III with min reversibilty  08/11/2006    Followed in Pulmonary clinic/ [facility]/ [place] - PFT's 04/28/2013  FEV1 0.88 (40%) with ratio 44 and 14% better p B2 dlco 45 corrects to 83 - Trial of breo 04/28/2013 > improved symptoms  06/09/2013  - spirometry 06/04/2014 FEV1  0.76 (29%) ratio 45      . Iron deficiency anemia 01/25/2009    Qualifier: Diagnosis of  By: Henrene Pastor MD, Docia Chuck   . History of blood transfusion "couple times"    "related to bleeding in colon and esophagus"  . Arthritis     "left shoulder" (10/19/2014)  . Anxiety   . 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 Iliac artery stent Left 2005    CIA  . Esophagogastroduodenoscopy  2012    normal  . Colonoscopy  July 2015    Dr. Henrene Pastor  . Tonsillectomy    . Knee arthroscopy with medial menisectomy Left 03/08/2014    Procedure: LEFT KNEE SCOPE WITH MEDIAL MENISECTOMY AND CHONDROPLASTY;  Surgeon: Ninetta Lights, MD;  Location: [place];  Service: Orthopedics;  Laterality: Left;  .  Left and right heart catheterization with coronary angiogram N/A 06/28/2014    Procedure: LEFT AND RIGHT HEART CATHETERIZATION WITH CORONARY ANGIOGRAM;  Surgeon: Jerline Pain, MD;  Location: [facility] CATH LAB;  Service: Cardiovascular;  Laterality: N/A;  . Esophagogastroduodenoscopy N/A 08/17/2014    Procedure: ESOPHAGOGASTRODUODENOSCOPY (EGD);  Surgeon: Jerene Bears, MD;  Location: [facility] ENDOSCOPY;  Service: Endoscopy;  Laterality: N/A;  . Colonoscopy N/A 08/17/2014    Procedure: COLONOSCOPY;  Surgeon: Jerene Bears, MD;  Location: [facility] ENDOSCOPY;   Service: Endoscopy;  Laterality: N/A;  . Cardiac catheterization  2001; 06/28/2014  . Cardiac catheterization N/A 10/19/2014    Procedure: Coronary Stent Intervention;  Surgeon: Burnell Blanks, MD;  Location: [place] CV LAB;  Service: Cardiovascular;  Laterality: N/A;  BMS Mid RCA  . Transcatheter aortic valve replacement, transapical N/A 11/20/2014    Procedure: TRANSCATHETER AORTIC VALVE REPLACEMENT, TRANSAPICAL;  Surgeon: [name], MD;  Location: [place];  Service: Open Heart Surgery;  Laterality: N/A;  . Tee without cardioversion N/A 11/20/2014    Procedure: TRANSESOPHAGEAL ECHOCARDIOGRAM (TEE);  Surgeon: [name], MD;  Location: [place];  Service: Open Heart Surgery;  Laterality: N/A;  . Rib plating Left 11/20/2014    Procedure: RIB PLATING OF LEFT 8TH RIB;  Surgeon: [name], MD;  Location: [place];  Service: Open Heart Surgery;  Laterality: Left;  . Colonoscopy N/A 12/05/2014    Procedure: COLONOSCOPY;  Surgeon: Manus Gunning, MD;  Location: [place];  Service: Gastroenterology;  Laterality: N/A;  . Enteroscopy N/A 12/05/2014    Procedure: ENTEROSCOPY;  Surgeon: Manus Gunning, MD;  Location: [place];  Service: Gastroenterology;  Laterality: N/A;    Social History Ronald Bell  reports that he quit smoking about 8 months ago. His smoking use included Cigarettes and E-cigarettes. He has a 60 pack-year smoking history. He has never used smokeless tobacco. He reports that he drinks about 8.4 oz of alcohol per week. He reports that he does not use illicit drugs.  family history includes Colitis in his father; Heart disease in his brother and daughter; Hyperlipidemia in his brother; Hypertension in his brother and son; Lung disease in his mother. There is no history of Colon cancer.  No Known Allergies     PHYSICAL EXAMINATION: Vital signs: BP 140/60 mmHg  Pulse 80  Ht 5\' 3"  (1.6 m)  Wt 138 lb (62.596 kg)  BMI 24.45 kg/m2   Constitutional: Chronically ill-appearing, weak appearing, oxygen in place, no acute distress Psychiatric: alert and oriented x3, cooperative Eyes: extraocular movements intact, anicteric, conjunctiva pink Mouth: oral pharynx moist, no lesions Neck: supple no lymphadenopathy Cardiovascular: heart regular rate and rhythm, no obvious murmur Lungs: clear to auscultation bilaterally Abdomen: soft, nontender, nondistended, no obvious ascites, no peritoneal signs, normal bowel sounds, no organomegaly Rectal: Omitted Extremities: No clubbing or cyanosis. Trace lower extremity edema bilaterally Skin: no lesions on visible extremities Neuro: No focal deficits. No asterixis, but weakness when holding arms out.    ASSESSMENT:  #1. Recurrent anemia secondary to gastrointestinal AVMs. Status post recent colonoscopy and push enteroscopy with ablation therapy. #2. Multiple significant medical problems and overall debility   PLAN:  #1. Hopefully the combination of recent ablation therapy and is being off Plavix will result in more sustained acceptable hemoglobin levels. I would like to place him back on iron 325 mg twice daily. I would like him on this indefinitely. He agrees. Iron has been prescribed today. Also, recheck hemoglobin today.  We will contact him with the value. He may need hemoglobins checked periodically or as clinically indicated. May need occasional blood transfusion for symptomatically anemia. He will resume general medical care with his PCP.  25 minutes was spent face-to-face with this patient for his evaluation. Greater than 50% of the time was used for counseling regarding his GI condition, recent treatment, and long-term treatment recommendations.

## 2014-12-24 NOTE — Progress Notes (Addendum)
Ronald Bell       [place] [address]             [phone]     CARDIOTHORACIC SURGERY OFFICE NOTE  Referring Provider is Ronald Pain, MD PCP is Ronald Koyanagi, DO   HPI:  Patient returns for routine follow-up up approximately one month status post transcatheter aortic valve replacement using a 26 mm Edwards Sapien XT transcatheter heart valve placed via trans-apical approach on 11/20/2014.  His early postoperative recovery in the hospital was notable for paroxysmal atrial fibrillation for which he was treated with amiodarone. He was discharged from the hospital on the seventh postoperative day in sinus rhythm.  Because of his history of recurrent GI bleeding in the past he was not anticoagulated using warfarin.  Dual antiplatelets therapy using aspirin and Plavix was resumed because of recent PCI and stenting of the right coronary artery.  He was readmitted to the hospital one week later with melena and symptomatic anemia with hemoglobin 5.1.  He was transfused a total of 5 units packed red blood cells and underwent both upper and lower endoscopy with ablation of multiple AVMs. During his hospitalization he also underwent left thoracentesis for a moderate sized left pleural effusion. He was eventually discharged home and he returns to our office today for routine follow-up. He was seen in follow-up earlier today by Dr. [name]. He has not had any further signs of GI bleeding and his hemoglobin has remained stable 11.5. The patient states that he is still limited by exertional shortness of breath that is multifactorial and related to his underlying COPD and chronic diastolic congestive heart failure. He has not had any chest Bell or chest tightness. Appetite is good. Home health physical therapy has been working with him and he has been making slow but steady progress. He does not wish to participate in outpatient cardiac rehabilitation program.   Current Outpatient  Prescriptions  Medication Sig Dispense Refill  . ALPRAZolam (XANAX) 0.25 MG tablet Take 1 tablet (0.25 mg total) by mouth 2 (two) times daily as needed for anxiety. 20 tablet 0  . amiodarone (PACERONE) 200 MG tablet Take 1 tablet (200 mg total) by mouth 2 (two) times daily after a meal. 60 tablet 2  . Ascorbic Acid (VITAMIN C) 250 MG tablet Take 500 mg by mouth daily.     Marland Kitchen aspirin 81 MG chewable tablet Chew 1 tablet (81 mg total) by mouth daily.    Marland Kitchen atorvastatin (LIPITOR) 40 MG tablet TAKE ONE TABLET BY MOUTH ONCE DAILY (Patient taking differently: TAKE 40 MG BY MOUTH ONCE DAILY) 90 tablet 0  . BREO ELLIPTA 100-25 MCG/INH AEPB INHALE ONE PUFF INTO LUNGS ONCE DAILY IN THE MORNING 60 each 5  . docusate sodium (COLACE) 100 MG capsule Take 100 mg by mouth 2 (two) times daily.     . fenofibrate 160 MG tablet Take 1 tablet (160 mg total) by mouth daily. 30 tablet 2  . ferrous sulfate 325 (65 FE) MG EC tablet Take 1 tablet (325 mg total) by mouth 2 (two) times daily. 60 tablet 11  . Ipratropium-Albuterol (COMBIVENT RESPIMAT) 20-100 MCG/ACT AERS respimat Inhale 1 puff into the lungs 4 (four) times daily as needed for wheezing. 1 Inhaler 3  . metoprolol tartrate (LOPRESSOR) 25 MG tablet Take 0.5 tablets (12.5 mg total) by mouth 2 (two) times daily. 60 tablet 6  . Multiple Vitamins-Minerals (CENTRUM SILVER PO) Take 1 tablet by mouth daily.     Marland Kitchen  pantoprazole (PROTONIX) 40 MG tablet TAKE ONE TABLET BY MOUTH ONCE DAILY (Patient taking differently: Take 40 mg by mouth daily. ) 90 tablet 3  . potassium chloride SA (K-DUR,KLOR-CON) 20 MEQ tablet Take 1 tablet (20 mEq total) by mouth 2 (two) times daily. 60 tablet 0  . temazepam (RESTORIL) 15 MG capsule Take 1 capsule (15 mg total) by mouth at bedtime as needed for sleep. 30 capsule 0  . valsartan (DIOVAN) 80 MG tablet Take 1 tablet (80 mg total) by mouth daily. 30 tablet 2  . furosemide (LASIX) 20 MG tablet Take 3 tablets (60 mg total) by mouth 2 (two) times  daily. (Patient not taking: Reported on 12/24/2014) 60 tablet 0   No current facility-administered medications for this visit.      Physical Exam:   BP 142/63 mmHg  Pulse 70  Resp 24  Ht 5\' 3"  (1.6 m)  Wt 138 lb (62.596 kg)  BMI 24.45 kg/m2  SpO2 98%  General:  Frail but well-appearing  Chest:   Clear to auscultation with symmetrical breath sounds  CV:   Regular rate and rhythm with soft systolic murmur  Incisions:  Clean and dry and healing nicely  Abdomen:  Soft and nontender  Extremities:  Warm and well-perfused  Diagnostic Tests:  CHEST 2 VIEW  COMPARISON: Portable chest x-ray of December 08, 2014  FINDINGS: The lungs are well-expanded. The interstitial markings remain increased but have improved on the left since the previous study. On the right there are slightly more conspicuous. There is a small left pleural effusion which appears slightly larger today. The heart is normal in size. The central pulmonary vascularity is prominent. A valve cage in the aortic position is present.  IMPRESSION: COPD and mild pulmonary interstitial edema. This is slightly more conspicuous on the right today but it has improved on the left. A small left pleural effusion is slightly larger today.   Electronically Signed  By: Ronald Bell M.D.  On: 12/24/2014 14:08    Impression:  Patient appears to be doing recently well proximally one month following transcatheter aortic valve replacement the transapical approach. He has a long history of recurrent GI bleeds and was recently hospitalized with another episode of GI bleeding for which he underwent both upper and lower endoscopy with ablation of multiple AVMs.  Since his most recent hospital discharge he has remained quite stable with no further signs of GI bleeding. He also remains stable from a cardiac standpoint.  He describes stable symptoms of exertional shortness of breath consistent with chronic diastolic congestive  heart failure, NYHA functional class IIB-III. His underlying history of chronic diastolic congestive heart failure is complicated by the presence of significant COPD.  He is currently anticoagulated using only low-dose aspirin.  Routine follow-up echocardiogram was performed earlier today but has yet to be officially reviewed by a cardiologist. I have personally reviewed the images and the patient has normal functioning transcatheter aortic heart valve and preserved left ventricular systolic function.  Peak velocity across the aortic valve was measured less than 1.7 m/s corresponding to mean transvalvular gradient estimated 7 mmHg. There is no appreciable paravalvular leak.    Plan:  I have instructed the patient to decrease his dose of amiodarone to 200 mg daily. I have renewed his prescription for Lasix but decrease the dose to 40 mg daily. Similarly, I have decreased his dose of potassium chloride to 20 mEq daily.  I have encouraged the patient to continue to gradually increase his physical  activity as tolerated and I have strongly suggested that he would benefit from participation in the outpatient cardiac rehabilitation program. We will make sure that he has a routine follow-up appointment with Dr. Marlou Porch within the next 3 months. The patient will return to the multidisciplinary heart valve clinic in 1 year for routine follow-up echocardiogram. All of his questions have been addressed.  I spent in excess of 15 minutes during the conduct of this office consultation and >50% of this time involved direct face-to-face encounter with the patient for counseling and/or coordination of their care.   Valentina Gu. Roxy Manns, MD 12/24/2014 1:25 PM

## 2014-12-24 NOTE — Patient Instructions (Addendum)
  Decrease your dose of Amiodarone to 200 mg (1 tablet) daily until their current prescription runs out, then stop taking it altogether.  Decrease lasix to 40 mg (1 tablet) daily  Decrease potassium chloride to 20 mEq (1 tablet) daily  You may continue to gradually increase your physical activity as tolerated.  Refrain from any heavy lifting or strenuous use of your arms and shoulders until at least 8 weeks from the time of your surgery, and avoid activities that cause increased pain in your chest on the side of your surgical incision.  Otherwise you may continue to increase activities without any particular limitations.  Increase the intensity and duration of physical activity gradually.  The patient is encouraged to enroll and participate in the outpatient cardiac rehab program beginning as soon as practical.  Schedule follow up appointment with Dr Marlou Porch within 3 months

## 2014-12-24 NOTE — Progress Notes (Signed)
  Echocardiogram 2D Echocardiogram has been performed.  Ronald Bell 12/24/2014, 12:00 PM

## 2014-12-25 ENCOUNTER — Other Ambulatory Visit: Payer: Self-pay | Admitting: Family Medicine

## 2014-12-25 DIAGNOSIS — J61 Pneumoconiosis due to asbestos and other mineral fibers: Secondary | ICD-10-CM | POA: Diagnosis not present

## 2014-12-25 DIAGNOSIS — J439 Emphysema, unspecified: Secondary | ICD-10-CM | POA: Diagnosis not present

## 2014-12-25 DIAGNOSIS — I739 Peripheral vascular disease, unspecified: Secondary | ICD-10-CM | POA: Diagnosis not present

## 2014-12-25 DIAGNOSIS — I10 Essential (primary) hypertension: Secondary | ICD-10-CM | POA: Diagnosis not present

## 2014-12-25 DIAGNOSIS — I2510 Atherosclerotic heart disease of native coronary artery without angina pectoris: Secondary | ICD-10-CM | POA: Diagnosis not present

## 2014-12-25 DIAGNOSIS — K5520 Angiodysplasia of colon without hemorrhage: Secondary | ICD-10-CM | POA: Diagnosis not present

## 2014-12-25 NOTE — Telephone Encounter (Signed)
Last seen and filled 12/17/14 #20, I spoke with the pharmacy and the Rx was received but only a 10 day supply. Please advise      KP

## 2014-12-26 DIAGNOSIS — K5520 Angiodysplasia of colon without hemorrhage: Secondary | ICD-10-CM | POA: Diagnosis not present

## 2014-12-26 DIAGNOSIS — J439 Emphysema, unspecified: Secondary | ICD-10-CM | POA: Diagnosis not present

## 2014-12-26 DIAGNOSIS — I2510 Atherosclerotic heart disease of native coronary artery without angina pectoris: Secondary | ICD-10-CM | POA: Diagnosis not present

## 2014-12-26 DIAGNOSIS — I10 Essential (primary) hypertension: Secondary | ICD-10-CM | POA: Diagnosis not present

## 2014-12-26 DIAGNOSIS — I739 Peripheral vascular disease, unspecified: Secondary | ICD-10-CM | POA: Diagnosis not present

## 2014-12-26 DIAGNOSIS — J61 Pneumoconiosis due to asbestos and other mineral fibers: Secondary | ICD-10-CM | POA: Diagnosis not present

## 2014-12-27 DIAGNOSIS — I10 Essential (primary) hypertension: Secondary | ICD-10-CM | POA: Diagnosis not present

## 2014-12-27 DIAGNOSIS — J61 Pneumoconiosis due to asbestos and other mineral fibers: Secondary | ICD-10-CM | POA: Diagnosis not present

## 2014-12-27 DIAGNOSIS — J439 Emphysema, unspecified: Secondary | ICD-10-CM | POA: Diagnosis not present

## 2014-12-27 DIAGNOSIS — K5520 Angiodysplasia of colon without hemorrhage: Secondary | ICD-10-CM | POA: Diagnosis not present

## 2014-12-27 DIAGNOSIS — I2510 Atherosclerotic heart disease of native coronary artery without angina pectoris: Secondary | ICD-10-CM | POA: Diagnosis not present

## 2014-12-27 DIAGNOSIS — I739 Peripheral vascular disease, unspecified: Secondary | ICD-10-CM | POA: Diagnosis not present

## 2015-01-01 DIAGNOSIS — J439 Emphysema, unspecified: Secondary | ICD-10-CM | POA: Diagnosis not present

## 2015-01-01 DIAGNOSIS — I2510 Atherosclerotic heart disease of native coronary artery without angina pectoris: Secondary | ICD-10-CM | POA: Diagnosis not present

## 2015-01-01 DIAGNOSIS — J61 Pneumoconiosis due to asbestos and other mineral fibers: Secondary | ICD-10-CM | POA: Diagnosis not present

## 2015-01-01 DIAGNOSIS — I10 Essential (primary) hypertension: Secondary | ICD-10-CM | POA: Diagnosis not present

## 2015-01-01 DIAGNOSIS — I739 Peripheral vascular disease, unspecified: Secondary | ICD-10-CM | POA: Diagnosis not present

## 2015-01-01 DIAGNOSIS — K5520 Angiodysplasia of colon without hemorrhage: Secondary | ICD-10-CM | POA: Diagnosis not present

## 2015-01-02 DIAGNOSIS — K5520 Angiodysplasia of colon without hemorrhage: Secondary | ICD-10-CM | POA: Diagnosis not present

## 2015-01-02 DIAGNOSIS — I2510 Atherosclerotic heart disease of native coronary artery without angina pectoris: Secondary | ICD-10-CM | POA: Diagnosis not present

## 2015-01-02 DIAGNOSIS — I739 Peripheral vascular disease, unspecified: Secondary | ICD-10-CM | POA: Diagnosis not present

## 2015-01-02 DIAGNOSIS — J439 Emphysema, unspecified: Secondary | ICD-10-CM | POA: Diagnosis not present

## 2015-01-02 DIAGNOSIS — I10 Essential (primary) hypertension: Secondary | ICD-10-CM | POA: Diagnosis not present

## 2015-01-02 DIAGNOSIS — J61 Pneumoconiosis due to asbestos and other mineral fibers: Secondary | ICD-10-CM | POA: Diagnosis not present

## 2015-01-03 ENCOUNTER — Telehealth: Payer: Self-pay | Admitting: *Deleted

## 2015-01-03 DIAGNOSIS — J439 Emphysema, unspecified: Secondary | ICD-10-CM | POA: Diagnosis not present

## 2015-01-03 DIAGNOSIS — I2510 Atherosclerotic heart disease of native coronary artery without angina pectoris: Secondary | ICD-10-CM | POA: Diagnosis not present

## 2015-01-03 DIAGNOSIS — K5520 Angiodysplasia of colon without hemorrhage: Secondary | ICD-10-CM | POA: Diagnosis not present

## 2015-01-03 DIAGNOSIS — I10 Essential (primary) hypertension: Secondary | ICD-10-CM | POA: Diagnosis not present

## 2015-01-03 DIAGNOSIS — I739 Peripheral vascular disease, unspecified: Secondary | ICD-10-CM | POA: Diagnosis not present

## 2015-01-03 DIAGNOSIS — J61 Pneumoconiosis due to asbestos and other mineral fibers: Secondary | ICD-10-CM | POA: Diagnosis not present

## 2015-01-03 NOTE — Telephone Encounter (Signed)
Home health certification and plan of care received via fax from [facility] for [place] period 6/37/85 - 02/07/15. Forwarded to Dr. Etter Sjogren. JG//CMA

## 2015-01-04 ENCOUNTER — Telehealth: Payer: Self-pay | Admitting: Cardiovascular Disease

## 2015-01-04 NOTE — Telephone Encounter (Signed)
New message      Talk to the nurse regarding a form from [facility].

## 2015-01-04 NOTE — Telephone Encounter (Signed)
Spoke with pt who is asking I contact Lisa at [facility] regarding his oxygen orders.  Number is [phone] ext [phone]. I spoke with Lattie Haw and let her know updated form was faxed on November 29, 2014.  She did not receive this form. Lattie Haw will refax form.

## 2015-01-07 ENCOUNTER — Other Ambulatory Visit: Payer: Self-pay | Admitting: Family Medicine

## 2015-01-07 NOTE — Telephone Encounter (Signed)
Follow up     Calling to let you know she refaxed the CMN? To you

## 2015-01-07 NOTE — Telephone Encounter (Signed)
Form completed and faxed to [facility].  Lattie Haw notified form has been fax.  She will let us know if she does not receive it.

## 2015-01-07 NOTE — Telephone Encounter (Signed)
Last seen 12/17/14 and filled  Xanax 12/25/14 #20 Temazepam 12/13/14 #30   Please advise     KP

## 2015-01-08 DIAGNOSIS — J439 Emphysema, unspecified: Secondary | ICD-10-CM | POA: Diagnosis not present

## 2015-01-08 DIAGNOSIS — I2510 Atherosclerotic heart disease of native coronary artery without angina pectoris: Secondary | ICD-10-CM | POA: Diagnosis not present

## 2015-01-08 DIAGNOSIS — K5520 Angiodysplasia of colon without hemorrhage: Secondary | ICD-10-CM | POA: Diagnosis not present

## 2015-01-08 DIAGNOSIS — I739 Peripheral vascular disease, unspecified: Secondary | ICD-10-CM | POA: Diagnosis not present

## 2015-01-08 DIAGNOSIS — I10 Essential (primary) hypertension: Secondary | ICD-10-CM | POA: Diagnosis not present

## 2015-01-08 DIAGNOSIS — J61 Pneumoconiosis due to asbestos and other mineral fibers: Secondary | ICD-10-CM | POA: Diagnosis not present

## 2015-01-10 ENCOUNTER — Encounter: Payer: Self-pay | Admitting: Internal Medicine

## 2015-01-10 ENCOUNTER — Ambulatory Visit (INDEPENDENT_AMBULATORY_CARE_PROVIDER_SITE_OTHER): Payer: Medicare Other | Admitting: Internal Medicine

## 2015-01-10 VITALS — BP 110/64 | HR 72 | Ht 63.5 in | Wt 136.0 lb

## 2015-01-10 DIAGNOSIS — J449 Chronic obstructive pulmonary disease, unspecified: Secondary | ICD-10-CM | POA: Diagnosis not present

## 2015-01-10 DIAGNOSIS — I2510 Atherosclerotic heart disease of native coronary artery without angina pectoris: Secondary | ICD-10-CM

## 2015-01-10 DIAGNOSIS — J9612 Chronic respiratory failure with hypercapnia: Secondary | ICD-10-CM

## 2015-01-10 DIAGNOSIS — J9611 Chronic respiratory failure with hypoxia: Secondary | ICD-10-CM | POA: Diagnosis not present

## 2015-01-10 MED ORDER — TIOTROPIUM BROMIDE-OLODATEROL 2.5-2.5 MCG/ACT IN AERS: 2.0000 | INHALATION_SPRAY | Freq: Every day | RESPIRATORY_TRACT | Status: DC

## 2015-01-10 MED ORDER — ALBUTEROL SULFATE HFA 108 (90 BASE) MCG/ACT IN AERS: INHALATION_SPRAY | RESPIRATORY_TRACT | Status: DC

## 2015-01-10 MED ORDER — PREDNISONE 10 MG PO TABS: ORAL_TABLET | ORAL | Status: DC

## 2015-01-10 NOTE — Progress Notes (Signed)
Subjective:    Patient ID: Ronald Bell, male    DOB: 07/30/1940  MRN: [id number]  Brief patient profile:  73 yowm  Quit smoking 12/2014  from [place] with variable cough starting Fall on Nov 2014 referred 03/27/2013 by Dr Etter Sjogren for abn CT c/w asbestosis with GOLD III copd with min  reversibility by pfts 04/28/13 and ? acei cough   History of Present Illness  03/27/2013 1st [facility] office visit/ [name] still active smoker cc recurrent episodes of severe refractory cough this episode started x 3 weeks prior to OV   but comes and goes since early fall 2014 assoc with doe x  walking garbage to street x 3 years better up to several hours after combivent on tudorza bid miant as well.  Cough is variably prod of thick yellow mucus but min amt, mostly prod in am and dry rest of the day.  Has noct choking spells just about every night p lies down better if sits up and takes combivent - they feel almost  like something strangling him.  rec Stop lisinopril Benicar 20/12.5 one daily  Pantoprazole (protonix) 40 mg   Take 30-60 min before first meal of the day and Pepcid ac 20 mg one bedtime until return to office   The key is to stop smoking completely before smoking completely stops you!    04/04/2013  Acute  ov/Mccade Sullenberger re: refractory cough  Chief Complaint  Patient presents with  . Acute Visit    Pt c/o increased cough since last visit. Cough is non prod and worse at night and early am. He states that he gets SOB with coughing spells and is using combivent 5 times daily. He states had episode of syncope last night and EMS was called.    really not sob unless coughing. No excess mucus, present 24/7  rec Stop tudorza Prednisone 10 mg take  4 each am x 2 days,   2 each am x 2 days,  1 each am x 2 days and stop  mucinex dm 1200 twice daily as long as you feel the urge to cough and supplement it with Tramadol 50 up to 2 to every 4 hours- once the cough is gone back off of it. Prednisone 10 mg take  4  each am x 2 days,   2 each am x 2 days,  1 each am x 2 days and stop  Pantoprazole (protonix) 40 mg   Take 30-60 min before first meal of the day and Pepcid 20 mg one bedtime until return to office - this is the best way to tell whether stomach acid is contributing to your problem.   GERD diet   04/28/2013 f/u ov/Zoa Dowty re: GOLD III COPD / cough much better off ACEi Chief Complaint  Patient presents with  . Follow-up    Pt states breathing is slightly improved and cough is much improved. He is using combivent approx 4 times per day.   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 160 - 12.5 one daily  Ok to stop pepcid to see what effect this has on your cough and if worse restart, if no change in symptoms x sev weeks then  stop protonix too to see if really need it Start breo open it once  each am (x 2 drags to be sure you get  it all) Only use your combivent as needed       Admit date: 04/16/2014 Discharge date: 04/17/2014   Discharge Diagnoses:    COPD exacerbation   Acute respiratory failure  Hyperlipidemia  Essential hypertension   06/04/2014 f/u ov/Nandika Stetzer re: GOLD III / severe AS/ stopped smoking around 04/23/14  Chief Complaint  Patient presents with  . Acute Visit    Pt c/o increased SOB off and on since Feb 2016. He states that he wakes up in the night feeling out of breath and gets SOB walking from room to room at home. He is using combivent 5-6 x per day.    sleeping propped up now using noct combivent as well as q am breo   Cancelled ov with Dr [name] today "I wanted to get my breathing right first" rec Prednisone 10 mg take  4 each am x 2 days,   2 each am x 2 days,  1 each am x 2 days and stop  Add pepcid 20 mg at bedtime (over the counter) Prop up as much as possible at bedtime  No change Breo each am combivent up to 1 puff every 6 hours as needed  Keep appt with T  surgery    Last T surgery note  12/24/14  Patient returns for routine follow-up up approximately one month status post transcatheter aortic valve replacement using a 26 mm Edwards Sapien XT transcatheter heart valve placed via trans-apical approach on 11/20/2014. His early postoperative recovery in the hospital was notable for paroxysmal atrial fibrillation for which he was treated with amiodarone. He was discharged from the hospital on the seventh postoperative day in sinus rhythm. Because of his history of recurrent GI bleeding in the past he was not anticoagulated using warfarin. Dual antiplatelets therapy using aspirin and Plavix was resumed because of recent PCI and stenting of the right coronary artery. He was readmitted to the hospital one week later with melena and symptomatic anemia with hemoglobin 5.1. He was transfused a total of 5 units packed red blood cells and underwent both upper and lower endoscopy with ablation of multiple AVMs. During his hospitalization he also underwent left thoracentesis for a moderate sized left pleural effusion. He was eventually discharged home and he returns to our office today for routine follow-up. He was seen in follow-up earlier today by Dr. Henrene Pastor. He has not had any further signs of GI bleeding and his hemoglobin has remained stable 11.5. The patient states that he is still limited by exertional shortness of breath that is multifactorial and related to his underlying COPD and chronic diastolic congestive heart failure. He has not had any chest pain or chest tightness. Appetite is good. Home health physical therapy has been working with him and he has been making slow but steady progress. He does not wish to participate in outpatient cardiac rehabilitation program.    01/10/2015  Ext f/u  ov/Clairissa Valvano re:  Re-establish care post op COPD GOLD III on Breo and prn combivent  Chief Complaint  Patient presents with  . Follow-up    COPD f/u pt. states breathing has  wrosen. SOB with activity. no wheezing. no cough. no chest pain/tightness. on 3L O2 pulse.   Can walk up and back driveway RA  2 x then checks 02 sats 88-89% then walks 2 more  Times was easier before surgery and not on 02 at that point  but still on BREO Finished amio one week prior to OV   Wearing 02 hs  3lpm   No obvious   day to day or daytime variabilty or assoc cough or   cp or chest tightness, subjective wheeze overt sinus or hb symptoms. No unusual exp hx or h/o childhood pna/ asthma or knowledge of premature birth.  Sleeping ok without nocturnal  or early am exacerbation  of respiratory  c/o's or need for noct saba. Also denies any obvious fluctuation of symptoms with weather or environmental changes or other aggravating or alleviating factors except as outlined above   Current Medications, Allergies, Complete Past Medical History, Past Surgical History, Family History, and Social History were reviewed in [employer] record.  ROS  The following are not active complaints unless bolded sore throat, dysphagia, dental problems, itching, sneezing,  nasal congestion or excess/ purulent secretions, ear ache,   fever, chills, sweats, unintended wt loss, pleuritic or exertional cp, hemoptysis,  orthopnea pnd or leg swelling, presyncope, palpitations, heartburn, abdominal pain, anorexia, nausea, vomiting, diarrhea  or change in bowel or urinary habits, change in stools or urine, dysuria,hematuria,  rash, arthralgias, visual complaints, headache, numbness weakness or ataxia or problems with walking or coordination,  change in mood/affect or memory.          Objective:   Physical Exam    amb wm nad  On 3lpm   06/04/2014        137 > 01/10/2015  136  Wt Readings from Last 3 Encounters:  06/09/13 133 lb (60.328 kg)  04/28/13 128 lb (58.06 kg)  04/04/13 130 lb (58.968 kg)     HEENT mild turbinate edema.  Oropharynx no thrush or excess pnd or cobblestoning.  No JVD or  cervical adenopathy. Mild accessory muscle hypertrophy. Trachea midline, nl thryroid. Chest was hyperinflated by percussion with diminished breath sounds and moderate increased exp time with trace bilateral late exp wheeze. Hoover sign positive at mid inspiration. Regular rate and rhythm with  no  gallop or rub or increase P2 or edema.  Abd: no hsm, nl excursion. Ext warm without cyanosis or clubbing.       I personally reviewed images and agree with radiology impression as follows:  CXR:  12/24/14 COPD and mild pulmonary interstitial edema. This is slightly more conspicuous on the right today but it has improved on the left. A small left pleural effusion is slightly larger today.       Assessment & Plan:

## 2015-01-10 NOTE — Assessment & Plan Note (Signed)
01/10/2015   Walked RA  2 laps @ 185 ft each stopped due to  Sob with sat 89% at nl pace  rec as of 01/10/2015  02 2lpm sleeping and exercising - otherwise no 02 needed

## 2015-01-10 NOTE — Assessment & Plan Note (Addendum)
-   PFT's 04/28/2013  FEV1 0.88 (40%) with ratio 44 and 14% better p B2 dlco 45 corrects to 83 - Trial of breo 04/28/2013 > improved symptoms  06/09/2013  - spirometry 06/04/2014 FEV1  0.76 (29%) ratio 45  -01/10/2015  extensive coaching HFA effectiveness =    90% >>   trial of stiolto    Symptoms are  disproportionate to objective findings and not clear this is a lung problem but pt does appear to have difficult airway management issues.  DDX of  difficult airways management all start with A and  include Adherence, Ace Inhibitors, Acid Reflux, Active Sinus Disease, Alpha 1 Antitripsin deficiency, Anxiety masquerading as Airways dz,  ABPA,  allergy(esp in young), Aspiration (esp in elderly), Adverse effects of meds,  Active smokers, A bunch of PE's (a small clot burden can't cause this syndrome unless there is already severe underlying pulm or vascular dz with poor reserve) plus two Bs  = Bronchiectasis and Beta blocker use..and one C= CHF   Adherence is always the initial "prime suspect" and is a multilayered concern that requires a "trust but verify" approach in every patient - starting with knowing how to use medications, especially inhalers, correctly, keeping up with refills and understanding the fundamental difference between maintenance and prns vs those medications only taken for a very short course and then stopped and not refilled.  The proper method of use, as well as anticipated side effects, of a metered-dose inhaler are discussed and demonstrated to the patient. Improved effectiveness after extensive coaching during this visit to a level of approximately  90% with respimat > try stiolto   ?active smoking > quit 12/2014   I reviewed the Fletcher curve with the patient that basically indicates  if you quit smoking when your best day FEV1 is still well preserved (as is only relatively   tru in his case)  it is highly unlikely you will progress to severe disease and informed the patient there was no  medication on the market that has proven to alter the curve/ its downward trajectory  or the likelihood of progression of their disease.  Therefore stopping maintaining abstinence is the most important aspect of care, not choice of inhalers or for that matter, doctors.    ? Anemia > hgb stable p gib, no further active bleeding   ? Allergy / asthma component > Prednisone 10 mg take  4 each am x 2 days,   2 each am x 2 days,  1 each am x 2 days and stop   ? Anxiety/ deconditioning usually dx of exclusion   ? chf  - doubt since  last BNP << 100 12/18/14   I had an extended discussion with the patient reviewing all relevant studies completed to date and  lasting 25 minutes of a 40 minute visit    Each maintenance medication was reviewed in detail including most importantly the difference between maintenance and prns and under what circumstances the prns are to be triggered using an action plan format that is not reflected in the computer generated alphabetically organized AVS.    Please see instructions for details which were reviewed in writing and the patient given a copy highlighting the part that I personally wrote and discussed at today's ov.

## 2015-01-10 NOTE — Patient Instructions (Addendum)
Stop breo and  combivent start stiolto 2 pffs each am   Only use your albuterol (proair) as a rescue medication to be used if you can't catch your breath by resting or doing a relaxed purse lip breathing pattern.  - The less you use it, the better it will work when you need it. - Ok to use up to 2 puffs  every 4 hours if you must but call for immediate appointment if use goes up over your usual need - Don't leave home without it !!  (think of it like the spare tire for your car)   02  2lpm sleeping and walking  No need for 02 inside the house with activties during the day  unless excercising   Prednisone 10 mg take  4 each am x 2 days,   2 each am x 2 days,  1 each am x 2 days and stop   Please schedule a follow up office visit in 6 weeks, call sooner if needed with pfts

## 2015-01-14 ENCOUNTER — Telehealth: Payer: Self-pay | Admitting: Internal Medicine

## 2015-01-14 NOTE — Telephone Encounter (Signed)
Spoke with pt, advised that PA process can take several days and that we would update him as we have an update. Checked up front, still have not received PA initiation form from pharmacy. Called pharmacy to re-request this.  Will await fax.

## 2015-01-14 NOTE — Telephone Encounter (Signed)
Pt back to check on status of refill/PA please advis. He can be reached@ same # .Ronald Bell

## 2015-01-14 NOTE — Telephone Encounter (Signed)
Spoke with [facility]. Was advised stiolto needs PA--pt must try and fail Breo. According to last note MW advised pt to stop breo and try stiolto. [facility] is faxing over PA. Will await this fax.

## 2015-01-14 NOTE — Telephone Encounter (Signed)
Pt also aware we are working on this

## 2015-01-15 ENCOUNTER — Other Ambulatory Visit: Payer: Self-pay | Admitting: Internal Medicine

## 2015-01-15 MED ORDER — ALBUTEROL SULFATE HFA 108 (90 BASE) MCG/ACT IN AERS: 2.0000 | INHALATION_SPRAY | RESPIRATORY_TRACT | Status: DC | PRN

## 2015-01-15 NOTE — Telephone Encounter (Signed)
Change to anoro one pff each am

## 2015-01-15 NOTE — Telephone Encounter (Signed)
Checked up front, MW's box and PA box, pa form has not been received yet.  Called pharmacy to have this re-requested.  Verified fax # on file.  Will await fax.

## 2015-01-15 NOTE — Telephone Encounter (Signed)
PA was initiated through Covermymeds.  Denied coverage.  Must use Breo or Anoro before Stiolto will be approved.  MW - please advise.

## 2015-01-16 DIAGNOSIS — J439 Emphysema, unspecified: Secondary | ICD-10-CM | POA: Diagnosis not present

## 2015-01-16 DIAGNOSIS — I10 Essential (primary) hypertension: Secondary | ICD-10-CM | POA: Diagnosis not present

## 2015-01-16 DIAGNOSIS — K5520 Angiodysplasia of colon without hemorrhage: Secondary | ICD-10-CM | POA: Diagnosis not present

## 2015-01-16 DIAGNOSIS — J61 Pneumoconiosis due to asbestos and other mineral fibers: Secondary | ICD-10-CM | POA: Diagnosis not present

## 2015-01-16 DIAGNOSIS — I2510 Atherosclerotic heart disease of native coronary artery without angina pectoris: Secondary | ICD-10-CM | POA: Diagnosis not present

## 2015-01-16 DIAGNOSIS — I739 Peripheral vascular disease, unspecified: Secondary | ICD-10-CM | POA: Diagnosis not present

## 2015-01-16 NOTE — Telephone Encounter (Signed)
lmtcb x1 for pt. 

## 2015-01-17 NOTE — Telephone Encounter (Signed)
[id number] x 2  

## 2015-01-18 MED ORDER — UMECLIDINIUM-VILANTEROL 62.5-25 MCG/INH IN AEPB: INHALATION_SPRAY | RESPIRATORY_TRACT | Status: DC

## 2015-01-18 NOTE — Telephone Encounter (Signed)
Spoke with pt. Aware of change in RX. This has been sent in. Nothing further needed

## 2015-01-21 ENCOUNTER — Other Ambulatory Visit: Payer: Self-pay | Admitting: Family Medicine

## 2015-01-21 ENCOUNTER — Telehealth: Payer: Self-pay | Admitting: Acute Care

## 2015-01-21 ENCOUNTER — Other Ambulatory Visit: Payer: Self-pay | Admitting: Thoracic Surgery (Cardiothoracic Vascular Surgery)

## 2015-01-21 NOTE — Telephone Encounter (Signed)
Last seen 12/17/14 and filled 01/07/15 #20  Please advise     KP

## 2015-01-21 NOTE — Telephone Encounter (Signed)
Left Message to make Appointment with wife.

## 2015-01-23 ENCOUNTER — Telehealth: Payer: Self-pay

## 2015-01-23 NOTE — Telephone Encounter (Signed)
Pt aware.

## 2015-01-23 NOTE — Telephone Encounter (Signed)
-----   Message from Algernon Huxley, RN sent at 12/24/2014  2:44 PM EDT ----- Regarding: cbc Pt needs CBC in Nov, order in epic.

## 2015-01-24 DIAGNOSIS — I739 Peripheral vascular disease, unspecified: Secondary | ICD-10-CM | POA: Diagnosis not present

## 2015-01-24 DIAGNOSIS — I10 Essential (primary) hypertension: Secondary | ICD-10-CM | POA: Diagnosis not present

## 2015-01-24 DIAGNOSIS — J61 Pneumoconiosis due to asbestos and other mineral fibers: Secondary | ICD-10-CM | POA: Diagnosis not present

## 2015-01-24 DIAGNOSIS — K5520 Angiodysplasia of colon without hemorrhage: Secondary | ICD-10-CM | POA: Diagnosis not present

## 2015-01-24 DIAGNOSIS — J439 Emphysema, unspecified: Secondary | ICD-10-CM | POA: Diagnosis not present

## 2015-01-24 DIAGNOSIS — I2510 Atherosclerotic heart disease of native coronary artery without angina pectoris: Secondary | ICD-10-CM | POA: Diagnosis not present

## 2015-01-29 ENCOUNTER — Other Ambulatory Visit (INDEPENDENT_AMBULATORY_CARE_PROVIDER_SITE_OTHER): Payer: Medicare Other

## 2015-01-29 ENCOUNTER — Other Ambulatory Visit: Payer: Self-pay

## 2015-01-29 DIAGNOSIS — D509 Iron deficiency anemia, unspecified: Secondary | ICD-10-CM

## 2015-01-29 LAB — CBC WITH DIFFERENTIAL/PLATELET
BASOS PCT: 0.4 % (ref 0.0–3.0)
Basophils Absolute: 0 10*3/uL (ref 0.0–0.1)
EOS PCT: 3.8 % (ref 0.0–5.0)
Eosinophils Absolute: 0.4 10*3/uL (ref 0.0–0.7)
HCT: 40.7 % (ref 39.0–52.0)
Hemoglobin: 13.4 g/dL (ref 13.0–17.0)
LYMPHS ABS: 0.9 10*3/uL (ref 0.7–4.0)
Lymphocytes Relative: 8.7 % — ABNORMAL LOW (ref 12.0–46.0)
MCHC: 33.1 g/dL (ref 30.0–36.0)
MCV: 93 fl (ref 78.0–100.0)
MONO ABS: 0.9 10*3/uL (ref 0.1–1.0)
Monocytes Relative: 9.5 % (ref 3.0–12.0)
NEUTROS ABS: 7.7 10*3/uL (ref 1.4–7.7)
Neutrophils Relative %: 77.6 % — ABNORMAL HIGH (ref 43.0–77.0)
Platelets: 256 10*3/uL (ref 150.0–400.0)
RBC: 4.37 Mil/uL (ref 4.22–5.81)
RDW: 19.1 % — AB (ref 11.5–15.5)
WBC: 9.9 10*3/uL (ref 4.0–10.5)

## 2015-01-30 NOTE — Telephone Encounter (Signed)
Left Message to make Appointment.   

## 2015-01-31 DIAGNOSIS — K5520 Angiodysplasia of colon without hemorrhage: Secondary | ICD-10-CM | POA: Diagnosis not present

## 2015-01-31 DIAGNOSIS — I2510 Atherosclerotic heart disease of native coronary artery without angina pectoris: Secondary | ICD-10-CM | POA: Diagnosis not present

## 2015-01-31 DIAGNOSIS — J439 Emphysema, unspecified: Secondary | ICD-10-CM | POA: Diagnosis not present

## 2015-01-31 DIAGNOSIS — I739 Peripheral vascular disease, unspecified: Secondary | ICD-10-CM | POA: Diagnosis not present

## 2015-01-31 DIAGNOSIS — J61 Pneumoconiosis due to asbestos and other mineral fibers: Secondary | ICD-10-CM | POA: Diagnosis not present

## 2015-01-31 DIAGNOSIS — I10 Essential (primary) hypertension: Secondary | ICD-10-CM | POA: Diagnosis not present

## 2015-02-01 ENCOUNTER — Telehealth: Payer: Self-pay

## 2015-02-01 MED ORDER — TEMAZEPAM 15 MG PO CAPS: 15.0000 mg | ORAL_CAPSULE | Freq: Every evening | ORAL | Status: DC | PRN

## 2015-02-01 NOTE — Telephone Encounter (Signed)
Refill x1 

## 2015-02-01 NOTE — Telephone Encounter (Signed)
Last seen 12/17/14 and filled 01/07/15 #30  Please advise     KP

## 2015-02-02 ENCOUNTER — Other Ambulatory Visit: Payer: Self-pay | Admitting: Family Medicine

## 2015-02-04 NOTE — Telephone Encounter (Signed)
Last seen 12/17/14 and filled 01/21/15 #20   Please advise     KP

## 2015-02-07 DIAGNOSIS — I10 Essential (primary) hypertension: Secondary | ICD-10-CM | POA: Diagnosis not present

## 2015-02-07 DIAGNOSIS — J61 Pneumoconiosis due to asbestos and other mineral fibers: Secondary | ICD-10-CM | POA: Diagnosis not present

## 2015-02-07 DIAGNOSIS — K5520 Angiodysplasia of colon without hemorrhage: Secondary | ICD-10-CM | POA: Diagnosis not present

## 2015-02-07 DIAGNOSIS — I739 Peripheral vascular disease, unspecified: Secondary | ICD-10-CM | POA: Diagnosis not present

## 2015-02-07 DIAGNOSIS — I2510 Atherosclerotic heart disease of native coronary artery without angina pectoris: Secondary | ICD-10-CM | POA: Diagnosis not present

## 2015-02-07 DIAGNOSIS — J439 Emphysema, unspecified: Secondary | ICD-10-CM | POA: Diagnosis not present

## 2015-02-11 ENCOUNTER — Other Ambulatory Visit: Payer: Self-pay | Admitting: Family Medicine

## 2015-02-15 ENCOUNTER — Other Ambulatory Visit: Payer: Self-pay | Admitting: Family Medicine

## 2015-02-15 NOTE — Telephone Encounter (Signed)
Called and spoke with pt. Patient stated he already had an ov. I confirmed PFT on 02/22/15 at 1pm and ov with MW on 02/22/15 at 2pm. Patient voiced understanding had no further questions. Nothing further needed. Will sign off on message.

## 2015-02-18 ENCOUNTER — Ambulatory Visit (INDEPENDENT_AMBULATORY_CARE_PROVIDER_SITE_OTHER): Payer: Medicare Other | Admitting: Cardiology

## 2015-02-18 ENCOUNTER — Encounter: Payer: Self-pay | Admitting: Cardiology

## 2015-02-18 VITALS — BP 130/60 | HR 82 | Ht 63.0 in | Wt 137.4 lb

## 2015-02-18 DIAGNOSIS — I2510 Atherosclerotic heart disease of native coronary artery without angina pectoris: Secondary | ICD-10-CM

## 2015-02-18 DIAGNOSIS — I2583 Coronary atherosclerosis due to lipid rich plaque: Secondary | ICD-10-CM

## 2015-02-18 DIAGNOSIS — K558 Other vascular disorders of intestine: Secondary | ICD-10-CM | POA: Diagnosis not present

## 2015-02-18 DIAGNOSIS — K5521 Angiodysplasia of colon with hemorrhage: Secondary | ICD-10-CM

## 2015-02-18 DIAGNOSIS — J948 Other specified pleural conditions: Secondary | ICD-10-CM

## 2015-02-18 DIAGNOSIS — Z954 Presence of other heart-valve replacement: Secondary | ICD-10-CM

## 2015-02-18 DIAGNOSIS — J90 Pleural effusion, not elsewhere classified: Secondary | ICD-10-CM

## 2015-02-18 DIAGNOSIS — Z952 Presence of prosthetic heart valve: Secondary | ICD-10-CM

## 2015-02-18 NOTE — Patient Instructions (Signed)
Medication Instructions:  Please stop your Fenofibrate. Continue all other medications as listed.  Follow-Up: Follow up in 3 months with Dr Marlou Porch.  If you need a refill on your cardiac medications before your next appointment, please call your pharmacy.  Thank you for choosing [facility]!!

## 2015-02-18 NOTE — Progress Notes (Signed)
Cardiology Office Note   Date:  02/18/2015   ID:  Ronald [facility] 23-Feb-1941, MRN [id number]  PCP:  Ronald Koyanagi, DO  Cardiologist:   Ronald Furbish, [facility]       History of Present Illness: [facility] is a 74 y.o. male who presents for follow-up of severe aortic stenosis status post transcatheter aortic valve replacement using 26 mm Edwards  heart valve placed via transapical approach (TAVR). Paroxysmal atrial fibrillation was noted and treated with amiodarone. He was not anticoagulated because of recurrent GI bleeding. Recent right coronary artery PCI and stenting with aspirin and Plavix noted. One week later, he was admitted to the hospital again with melena and hemoglobin was 5.1. He was transfused 5 units of blood. Endoscopic ablation with multiple AVMs noted. He had a left thoracentesis as well for moderate size left pleural effusion. Hemoglobin has remained stable at 11.5. He also saw Dr. Melvyn Bell of pulmonary medicine. On O2 only at night or walking.   Currently he is only on low-dose aspirin. No appreciable leak on echocardiogram.  He is now off of his amiodarone. He is maintaining normal rhythm. On 02/18/15 he also stopped his fenofibrate. Trigs were 128 last check.  He is still feeling some shortness of breath and was hoping that his shortness of breath would have improved significantly post valve replacement.     Past Medical History  Diagnosis Date  . Polyp of nasal cavity   . Rosacea   . Hx of adenomatous colonic polyps 2012, 2013.   . Tobacco abuse   . Hypertension   . Hyperlipidemia   . GERD (gastroesophageal reflux disease)   . GI bleed 2010    4 units PRBCs  . Peripheral vascular disease ([place])   . Irregular heartbeat   . Shingles   . Carotid artery occlusion   . Angiodysplasia of intestine with hemorrhage     large and SB, gastric AVMs.   . Pleural plaque with presence of asbestos 03/27/2013    Followed in Pulmonary clinic/ [facility]/  [place] - F/u CT 09/08/2013 1. Stable extensive calcified pleural plaque formation consistent with asbestos exposure. 2. Multiple pulmonary nodules are unchanged from the CT of 6 months ago. Given risk factors for lung cancer, continued follow up is recommended with chest CT in 6 months> done 04/20/14 no change >repeat in 12 m in tickle file     . GERD 11/30/2008    Qualifier: Diagnosis of  By: [facility], [facility]    . PVD (peripheral vascular disease) ([place]) 10/18/2012  . Carotid artery stenosis 04/22/2012  . Coronary artery disease   . COPD GOLD III with min reversibilty  08/11/2006    Followed in Pulmonary clinic/ [facility]/ [place] - PFT's 04/28/2013  FEV1 0.88 (40%) with ratio 44 and 14% better p B2 dlco 45 corrects to 83 - Trial of breo 04/28/2013 > improved symptoms  06/09/2013  - spirometry 06/04/2014 FEV1  0.76 (29%) ratio 45      . Iron deficiency anemia 01/25/2009    Qualifier: Diagnosis of  By: [facility], Ronald [facility]   . History of blood transfusion "couple times"    "related to bleeding in colon and esophagus"  . Arthritis     "left shoulder" (10/19/2014)  . Anxiety   . 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  Iliac artery stent Left 2005    CIA  . Esophagogastroduodenoscopy  2012    normal  . Colonoscopy  July 2015    Dr. [name]  . Tonsillectomy    . Knee arthroscopy with medial menisectomy Left 03/08/2014    Procedure: LEFT KNEE SCOPE WITH MEDIAL MENISECTOMY AND CHONDROPLASTY;  Surgeon: Ronald Lights, [facility];  [facility]: [facility];  Service: Orthopedics;  Laterality: Left;  . Left and right heart catheterization with coronary angiogram N/A 06/28/2014    Procedure: LEFT AND RIGHT HEART CATHETERIZATION WITH CORONARY ANGIOGRAM;  Surgeon: Ronald Pain, [facility];  [facility];  Service: Cardiovascular;  Laterality: N/A;  .  Esophagogastroduodenoscopy N/A 08/17/2014    Procedure: ESOPHAGOGASTRODUODENOSCOPY (EGD);  Surgeon: Ronald Bears, [facility];  Location: [facility] ENDOSCOPY;  Service: Endoscopy;  Laterality: N/A;  . Colonoscopy N/A 08/17/2014    Procedure: COLONOSCOPY;  Surgeon: Ronald Bears, [facility];  Location: [facility] ENDOSCOPY;  Service: Endoscopy;  Laterality: N/A;  . Cardiac catheterization  2001; 06/28/2014  . Cardiac catheterization N/A 10/19/2014    Procedure: Coronary Stent Intervention;  Surgeon: Ronald Blanks, [facility];  Location: [facility];  Service: Cardiovascular;  Laterality: N/A;  BMS Mid RCA  . Transcatheter aortic valve replacement, transapical N/A 11/20/2014    Procedure: TRANSCATHETER AORTIC VALVE REPLACEMENT, TRANSAPICAL;  Surgeon: Ronald Alberts, [facility];  Location: [facility];  Service: Open Heart Surgery;  Laterality: N/A;  . Tee without cardioversion N/A 11/20/2014    Procedure: TRANSESOPHAGEAL ECHOCARDIOGRAM (TEE);  Surgeon: Ronald Alberts, [facility];  Location: [facility];  Service: Open Heart Surgery;  Laterality: N/A;  . Rib plating Left 11/20/2014    Procedure: RIB PLATING OF LEFT 8TH RIB;  Surgeon: Ronald Alberts, [facility];  Location: [facility];  Service: Open Heart Surgery;  Laterality: Left;  . Colonoscopy N/A 12/05/2014    Procedure: COLONOSCOPY;  Surgeon: Ronald Gunning, [facility];  Location: [facility];  Service: Gastroenterology;  Laterality: N/A;  . Enteroscopy N/A 12/05/2014    Procedure: ENTEROSCOPY;  Surgeon: Ronald Gunning, [facility];  Location: [facility];  Service: Gastroenterology;  Laterality: N/A;     Current Outpatient Prescriptions  Medication Sig Dispense Refill  . albuterol (PROVENTIL HFA;VENTOLIN HFA) 108 (90 BASE) MCG/ACT inhaler Inhale 2 puffs into the lungs every 4 (four) hours as needed for wheezing or shortness of breath. 1 Inhaler 11  . ALPRAZolam (XANAX) 0.25 MG tablet TAKE ONE TABLET BY MOUTH TWICE DAILY AS NEEDED FOR ANXIETY 20 tablet 0  . Ascorbic Acid (VITAMIN C) 250 MG tablet Take 500 mg by  mouth daily.     Marland Kitchen aspirin 81 MG chewable tablet Chew 1 tablet (81 mg total) by mouth daily.    Marland Kitchen atorvastatin (LIPITOR) 40 MG tablet TAKE ONE TABLET BY MOUTH ONCE DAILY 90 tablet 1  . docusate sodium (COLACE) 100 MG capsule Take 100 mg by mouth 2 (two) times daily.     . fenofibrate 160 MG tablet Take 1 tablet (160 mg total) by mouth daily. 30 tablet 2  . ferrous sulfate 325 (65 FE) MG EC tablet Take 1 tablet (325 mg total) by mouth 2 (two) times daily. 60 tablet 11  . furosemide (LASIX) 20 MG tablet TAKE TWO TABLETS BY MOUTH ONCE DAILY 60 tablet 0  . metoprolol tartrate (LOPRESSOR) 25 MG tablet Take 0.5 tablets (12.5 mg total) by mouth 2 (two) times daily. 60 tablet 6  . Multiple Vitamins-Minerals (CENTRUM SILVER PO) Take 1 tablet by mouth daily.     Marland Kitchen  pantoprazole (PROTONIX) 40 MG tablet Take 40 mg by mouth daily.    . potassium chloride SA (K-DUR,KLOR-CON) 20 MEQ tablet Take 1 tablet (20 mEq total) by mouth daily. 60 tablet 0  . temazepam (RESTORIL) 15 MG capsule Take 1 capsule (15 mg total) by mouth at bedtime as needed. for sleep 30 capsule 0  . Tiotropium Bromide-Olodaterol (STIOLTO RESPIMAT) 2.5-2.5 MCG/ACT AERS Inhale 2 puffs into the lungs daily. 1 Inhaler 11  . Umeclidinium-Vilanterol (ANORO ELLIPTA) 62.5-25 MCG/INH AEPB 1 puff once daily every morning 60 each 6  . valsartan (DIOVAN) 80 MG tablet Take 1 tablet (80 mg total) by mouth daily. 30 tablet 2  . zolpidem (AMBIEN) 5 MG tablet Take 5 mg by mouth at bedtime.     No current facility-administered medications for this visit.    Allergies:   Review of patient's allergies indicates no known allergies.    Social History:  The patient  reports that he quit smoking about 8 weeks ago. His smoking use included Cigarettes and E-cigarettes. He has a 60 pack-year smoking history. He has never used smokeless tobacco. He reports that he drinks about 8.4 oz of alcohol per week. He reports that he does not use illicit drugs.   Family  History:  The patient's family history includes Colitis in his father; Heart disease in his brother and daughter; Hyperlipidemia in his brother; Hypertension in his brother and son; Lung disease in his mother. There is no history of Colon cancer.    ROS:  Please see the history of present illness.   Otherwise, review of systems are positive for none.   All other systems are reviewed and negative.    PHYSICAL EXAM: VS:  BP 130/60 mmHg  Pulse 82  Ht 5\' 3"  (1.6 m)  Wt 137 lb 6.4 oz (62.324 kg)  BMI 24.35 kg/m2  SpO2 96% , BMI Body mass index is 24.35 kg/(m^2). GEN: Well nourished, well developed, in no acute distress HEENT: normal Neck: no JVD, carotid bruits, or masses Cardiac: RRR; 2/6 S murmur, rubs, or gallops,no edema  Respiratory:  decreased air movement bilaterally, blunted bases no wheezes  GI: soft, nontender, nondistended, + BS MS: no deformity or atrophy Skin: warm and dry, no rash Neuro:  Strength and sensation are intact Psych: euthymic mood, full affect   EKG:  None today   Recent Labs: 06/01/2014: TSH 1.206 12/03/2014: B Natriuretic Peptide 59.7 12/07/2014: Magnesium 1.8 12/17/2014: ALT 20; BUN 29*; Creatinine, Ser 1.40; Potassium 4.5; Sodium 134* 01/29/2015: Hemoglobin 13.4; Platelets 256.0    Lipid Panel    Component Value Date/Time   CHOL 155 12/17/2014 1229   TRIG 128.0 12/17/2014 1229   TRIG 102 02/26/2006 1044   HDL 49.40 12/17/2014 1229   CHOLHDL 3 12/17/2014 1229   CHOLHDL 2.8 CALC 02/26/2006 1044   VLDL 25.6 12/17/2014 1229   LDLCALC 80 12/17/2014 1229      Wt Readings from Last 3 Encounters:  02/18/15 137 lb 6.4 oz (62.324 kg)  01/10/15 136 lb (61.689 kg)  12/24/14 138 lb (62.596 kg)      Other studies Reviewed: Additional studies/ records that were reviewed today include: Prior Bell work reviewed. Review of the above records demonstrates: As above   ASSESSMENT AND PLAN:  1. Aortic stenosis  - TAVR  - Still feeling shortness of breath.     - underlying COPD as well.  - Excellent result   2. COPD  - Dr. Melvyn Bell  - Home oxygen  - About  to complete PFTs.  3. Pleural effusion  - Thoracentesis during repeat hospitalization post aortic valve replacement.   4. CAD   - RCA stenting  - Currently on aspirin only  - GI bleed  5. GI bleed/AVM  - Aspirin only  - Hemoglobin 13.4 on 01/29/15 up from 11.5  6. Paroxysmal atrial fibrillation postoperatively  - Off of amiodarone now. Doing well.  7. Lasix has been decreased 40 mg, continue with low-dose potassium supplementation.  8. Hyperlipidemia  - Continuing with atorvastatin 40 mg  - I will stop his fenofibrate, triglycerides were 128 at last check.    Current medicines are reviewed at length with the patient today.  The patient does not have concerns regarding medicines.  The following changes have been made:  no change  Labs/ tests ordered today include: as above  No orders of the defined types were placed in this encounter.     Disposition:   FU with Sanita Estrada 3 months Signed, Ronald Furbish, [facility]  02/18/2015 1:40 PM    [facility] [place], [place], [place]  [address] Phone: [phone]; Fax: [phone]

## 2015-02-19 ENCOUNTER — Other Ambulatory Visit: Payer: Self-pay | Admitting: Thoracic Surgery (Cardiothoracic Vascular Surgery)

## 2015-02-19 ENCOUNTER — Other Ambulatory Visit: Payer: Self-pay | Admitting: Family Medicine

## 2015-02-19 NOTE — Telephone Encounter (Signed)
Rx faxed to pharmacy  

## 2015-02-22 ENCOUNTER — Other Ambulatory Visit: Payer: Self-pay

## 2015-02-22 ENCOUNTER — Ambulatory Visit (INDEPENDENT_AMBULATORY_CARE_PROVIDER_SITE_OTHER): Payer: Medicare Other | Admitting: Internal Medicine

## 2015-02-22 ENCOUNTER — Other Ambulatory Visit: Payer: Self-pay | Admitting: *Deleted

## 2015-02-22 ENCOUNTER — Encounter: Payer: Self-pay | Admitting: Internal Medicine

## 2015-02-22 VITALS — BP 134/70 | HR 80 | Ht 62.5 in | Wt 138.0 lb

## 2015-02-22 DIAGNOSIS — R0600 Dyspnea, unspecified: Secondary | ICD-10-CM | POA: Diagnosis not present

## 2015-02-22 DIAGNOSIS — J9612 Chronic respiratory failure with hypercapnia: Secondary | ICD-10-CM | POA: Diagnosis not present

## 2015-02-22 DIAGNOSIS — J9611 Chronic respiratory failure with hypoxia: Secondary | ICD-10-CM | POA: Diagnosis not present

## 2015-02-22 DIAGNOSIS — F17210 Nicotine dependence, cigarettes, uncomplicated: Secondary | ICD-10-CM

## 2015-02-22 DIAGNOSIS — Z720 Tobacco use: Secondary | ICD-10-CM

## 2015-02-22 DIAGNOSIS — J449 Chronic obstructive pulmonary disease, unspecified: Secondary | ICD-10-CM

## 2015-02-22 DIAGNOSIS — I2510 Atherosclerotic heart disease of native coronary artery without angina pectoris: Secondary | ICD-10-CM

## 2015-02-22 LAB — PULMONARY FUNCTION TEST
DL/VA % pred: 51 %
DL/VA: 2.08 ml/min/mmHg/L
DLCO UNC % PRED: 40 %
DLCO UNC: 8.91 ml/min/mmHg
FEF 25-75 PRE: 0.3 L/s
FEF 25-75 Post: 0.33 L/sec
FEF2575-%Change-Post: 10 %
FEF2575-%Pred-Post: 22 %
FEF2575-%Pred-Pre: 20 %
FEV1-%Change-Post: 5 %
FEV1-%PRED-POST: 40 %
FEV1-%PRED-PRE: 37 %
FEV1-POST: 0.84 L
FEV1-PRE: 0.79 L
FEV1FVC-%Change-Post: 0 %
FEV1FVC-%Pred-Pre: 58 %
FEV6-%CHANGE-POST: 5 %
FEV6-%PRED-POST: 69 %
FEV6-%PRED-PRE: 65 %
FEV6-POST: 1.89 L
FEV6-PRE: 1.79 L
FEV6FVC-%CHANGE-POST: -2 %
FEV6FVC-%PRED-POST: 101 %
FEV6FVC-%PRED-PRE: 103 %
FVC-%CHANGE-POST: 7 %
FVC-%PRED-POST: 68 %
FVC-%Pred-Pre: 63 %
FVC-Post: 2.03 L
FVC-Pre: 1.9 L
POST FEV6/FVC RATIO: 93 %
PRE FEV6/FVC RATIO: 95 %
Post FEV1/FVC ratio: 41 %
Pre FEV1/FVC ratio: 42 %

## 2015-02-22 MED ORDER — FUROSEMIDE 20 MG PO TABS: 40.0000 mg | ORAL_TABLET | Freq: Every day | ORAL | Status: DC

## 2015-02-22 NOTE — Progress Notes (Signed)
PFT done today. 

## 2015-02-22 NOTE — Assessment & Plan Note (Signed)
Discussed the risks and costs (both direct and indirect)  of smoking relative to the benefits of quitting but patient unwilling to commit at this point to a specific quit date.    Offered to help with quitting  /  referral to our [employer] when the patient is ready.

## 2015-02-22 NOTE — Patient Instructions (Signed)
Continue Anoro one click each am    Only use your albuterol (proair) as a rescue medication to be used if you can't catch your breath by resting or doing a relaxed purse lip breathing pattern.  - The less you use it, the better it will work when you need it. - Ok to use up to 2 puffs  every 4 hours if you must but call for immediate appointment if use goes up over your usual need - Don't leave home without it !!  (think of it like the spare tire for your car)   02  2lpm sleeping and walking  No need for 02 inside the house with activties during the day  unless excercising     Please schedule a follow up visit in 3 months but call sooner if needed

## 2015-02-22 NOTE — Assessment & Plan Note (Signed)
HC03  12/17/14  32  01/10/2015   Walked RA  2 laps @ 185 ft each stopped due to  Sob with sat 89% at nl pace  rec as of 01/10/2015  02 2lpm sleeping and exercising - otherwise no 02 needed

## 2015-02-22 NOTE — Assessment & Plan Note (Signed)
-   PFT's 04/28/2013  FEV1 0.88 (40%) with ratio 44 and 14% better p B2 dlco 45 corrects to 83 - Trial of breo 04/28/2013 > improved symptoms  06/09/2013  - spirometry 06/04/2014 FEV1  0.76 (29%) ratio 45  -01/10/2015  extensive coaching HFA effectiveness =    90% >>   trial of stiolto  - PFT's  02/22/2015  FEV1 0.84 (40 % ) ratio 41  p 5 % improvement from saba with DLCO  40 % corrects to 51 % for alv volume   - rec rehab/ declined   Despite active smoking his pfts have stabilized as has his ex tol on present rx = laba/laba/ prn saba  I had an extended discussion with the patient reviewing all relevant studies completed to date and  lasting 15 to 20 minutes of a 25 minute visit    Each maintenance medication was reviewed in detail including most importantly the difference between maintenance and prns and under what circumstances the prns are to be triggered using an action plan format that is not reflected in the computer generated alphabetically organized AVS.    Please see instructions for details which were reviewed in writing and the patient given a copy highlighting the part that I personally wrote and discussed at today's ov.

## 2015-02-22 NOTE — Progress Notes (Signed)
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 acei cough   History of Present Illness  03/27/2013 1st [facility] office visit/ Ronald Bell Bell still active smoker cc recurrent episodes of severe refractory cough this episode started x 3 weeks prior to OV   but comes and goes since early fall 2014 assoc with doe x  walking garbage to street x 3 years better up to several hours after combivent on tudorza bid miant as well.  Cough is variably prod of thick yellow mucus but min amt, mostly prod in am and dry rest of the day.  Has noct choking spells just about every night p lies down better if sits up and takes combivent - they feel almost  like something strangling him.  rec Stop lisinopril Benicar 20/12.5 one daily  Pantoprazole (protonix) 40 mg   Take 30-60 min before first meal of the day and Pepcid ac 20 mg one bedtime until return to office   The key is to stop smoking completely before smoking completely stops you!    04/04/2013  Acute  ov/Ronald Bell Bell re: refractory cough  Chief Complaint  Patient presents with  . Acute Visit    Pt c/o increased cough since last visit. Cough is non prod and worse at night and early am. He states that he gets SOB with coughing spells and is using combivent 5 times daily. He states had episode of syncope last night and EMS was called.    really not sob unless coughing. No excess mucus, present 24/7  rec Stop tudorza Prednisone 10 mg take  4 each am x 2 days,   2 each am x 2 days,  1 each am x 2 days and stop  mucinex dm 1200 twice daily as long as you feel the urge to cough and supplement it with Tramadol 50 up to 2 to every 4 hours- once the cough is gone back off of it. Prednisone 10 mg take  4  each am x 2 days,   2 each am x 2 days,  1 each am x 2 days and stop  Pantoprazole (protonix) 40 mg   Take 30-60 min before first meal of the day and Pepcid 20 mg one bedtime until return to office - this is the best way to tell whether stomach acid is contributing to your problem.   GERD diet   04/28/2013 f/u ov/Ronald Bell Bell re: GOLD III COPD / cough much better off ACEi Chief Complaint  Patient presents with  . Follow-up    Pt states breathing is slightly improved and cough is much improved. He is using combivent approx 4 times per day.   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 160 - 12.5 one daily  Ok to stop pepcid to see what effect this has on your cough and if worse restart, if no change in symptoms x sev weeks then  stop protonix too to see if really need it Start breo open it once  each am (x 2 drags to be sure you get  it all) Only use your combivent as needed       Admit date: 04/16/2014 Discharge date: 04/17/2014   Discharge Diagnoses:    COPD exacerbation   Acute respiratory failure  Hyperlipidemia  Essential hypertension   06/04/2014 f/u ov/Ronald Bell Bell re: GOLD III / severe AS/ stopped smoking around 04/23/14  Chief Complaint  Patient presents with  . Acute Visit    Pt c/o increased SOB off and on since Feb 2016. He states that he wakes up in the night feeling out of breath and gets SOB walking from room to room at home. He is using combivent 5-6 x per day.    sleeping propped up now using noct combivent as well as q am breo   Cancelled ov with Ronald Bell Bell today "I wanted to get my breathing right first" rec Prednisone 10 mg take  4 each am x 2 days,   2 each am x 2 days,  1 each am x 2 days and stop  Add pepcid 20 mg at bedtime (over the counter) Prop up as much as possible at bedtime  No change Breo each am combivent up to 1 puff every 6 hours as needed  Keep appt with T  surgery    Last T surgery note  12/24/14  Patient returns for routine follow-up up approximately one month status post transcatheter aortic valve replacement using a 26 mm Edwards Sapien XT transcatheter heart valve placed via trans-apical approach on 11/20/2014. His early postoperative recovery in the hospital was notable for paroxysmal atrial fibrillation for which he was treated with amiodarone. He was discharged from the hospital on the seventh postoperative day in sinus rhythm. Because of his history of recurrent GI bleeding in the past he was not anticoagulated using warfarin. Dual antiplatelets therapy using aspirin and Plavix was resumed because of recent PCI and stenting of the right coronary artery. He was readmitted to the hospital one week later with melena and symptomatic anemia with hemoglobin 5.1. He was transfused a total of 5 units packed red blood cells and underwent both upper and lower endoscopy with ablation of multiple AVMs. During his hospitalization he also underwent left thoracentesis for a moderate sized left pleural effusion. He was eventually discharged home and he returns to our office today for routine follow-up. He was seen in follow-up earlier today by Ronald Bell. Ronald Bell Bell. He has not had any further signs of GI bleeding and his hemoglobin has remained stable 11.5. The patient states that he is still limited by exertional shortness of breath that is multifactorial and related to his underlying COPD and chronic diastolic congestive heart failure. He has not had any chest pain or chest tightness. Appetite is good. Home health physical therapy has been working with him and he has been making slow but steady progress. He does not wish to participate in outpatient cardiac rehabilitation program.    01/10/2015  Ext f/u  ov/Ronald Bell Bell re:  Re-establish care post op COPD GOLD III on Breo and prn combivent  Chief Complaint  Patient presents with  . Follow-up    COPD f/u pt. states breathing has  wrosen. SOB with activity. no wheezing. no cough. no chest pain/tightness. on 3L O2 pulse.   Can walk up and back driveway RA  2 x then checks 02 sats 88-89% then walks 2 more  Times was easier before surgery and not on 02 at that point  but still on BREO Finished amio one week prior to OV   Wearing 02 hs  3lpm  rec Stop breo and  combivent start stiolto 2 pffs each am  Only use your albuterol (proair) as a rescue medication 02  2lpm sleeping and walking  No need for 02 inside the house with activties during the day  unless excercising  Prednisone 10 mg take  4 each am x 2 days,   2 each am x 2 days,  1 each am x 2 days and stop  Please schedule a follow up office visit in 6 weeks, call sooner if needed with pfts    02/22/2015  f/u ov/Ronald Bell Bell re: GOLD III copd/ still smoking on anoro / alb 2 puffs around lunch time  Chief Complaint  Patient presents with  . Follow-up    pt following fore COPD with PFT today: pt c/o the regular SOB, and dry cough at times. no c/o wheezing or chest tightness.   walking up to 4 min back and forth to mb on 2lpm       No obvious   day to day or daytime variabilty or assoc cough or   cp or chest tightness, subjective wheeze overt sinus or hb symptoms. No unusual exp hx or h/o childhood pna/ asthma or knowledge of premature birth.  Sleeping ok without nocturnal  or early am exacerbation  of respiratory  c/o's or need for noct saba. Also denies any obvious fluctuation of symptoms with weather or environmental changes or other aggravating or alleviating factors except as outlined above   Current Medications, Allergies, Complete Past Medical History, Past Surgical History, Family History, and Social History were reviewed in [employer] record.  ROS  The following are not active complaints unless bolded sore throat, dysphagia, dental problems, itching, sneezing,  nasal congestion or excess/ purulent secretions, ear ache,   fever, chills, sweats,  unintended wt loss, pleuritic or exertional cp, hemoptysis,  orthopnea pnd or leg swelling, presyncope, palpitations, heartburn, abdominal pain, anorexia, nausea, vomiting, diarrhea  or change in bowel or urinary habits, change in stools or urine, dysuria,hematuria,  rash, arthralgias, visual complaints, headache, numbness weakness or ataxia or problems with walking or coordination,  change in mood/affect or memory.          Objective:   Physical Exam    amb wm nad      06/04/2014        137 > 01/10/2015  136 > 02/22/2015  138  Wt Readings from Last 3 Encounters:  06/09/13 133 lb (60.328 kg)  04/28/13 128 lb (58.06 kg)  04/04/13 130 lb (58.968 kg)     HEENT mild turbinate edema.  Oropharynx no thrush or excess pnd or cobblestoning.  No JVD or cervical adenopathy. Mild accessory muscle hypertrophy. Trachea midline, nl thryroid. Chest was hyperinflated by percussion with diminished breath sounds and moderate increased exp time with trace bilateral late exp wheeze. Hoover sign positive at mid inspiration. Regular rate and rhythm with  no  gallop or rub or increase P2 or edema.  Abd: no hsm, nl excursion. Ext warm without cyanosis or clubbing.       I personally reviewed images and agree with radiology impression as follows:  CXR:  12/24/14 COPD and mild pulmonary interstitial edema. This is slightly more conspicuous on the right today but it has improved on the left. A small left pleural effusion is slightly larger today.       Assessment & Plan:

## 2015-02-24 ENCOUNTER — Other Ambulatory Visit: Payer: Self-pay | Admitting: Family Medicine

## 2015-02-25 NOTE — Telephone Encounter (Signed)
Last OV 12/17/14 Zolpidem listed as historical med no record of last fill

## 2015-02-25 NOTE — Telephone Encounter (Signed)
Medication filled to pharmacy as requested.   

## 2015-03-04 ENCOUNTER — Other Ambulatory Visit: Payer: Self-pay | Admitting: Family

## 2015-03-04 NOTE — Telephone Encounter (Signed)
Dr Etter Sjogren-- please advise request:   Name from pharmacy:  In chart as:  ALPRAZolam 0.25MG  TAB ALPRAZolam (XANAX) 0.25 MG tablet    Sig: TAKE ONE TABLET BY MOUTH TWICE DAILY AS NEEDED FOR ANXIETY    Dispense: 20 tablet   Refills: 0   Start: 03/04/2015   Class: Normal    Requested on: 02/18/2015    Originally ordered on: 12/17/2014 02/19/2015

## 2015-03-07 ENCOUNTER — Other Ambulatory Visit: Payer: Self-pay | Admitting: Family Medicine

## 2015-03-08 NOTE — Telephone Encounter (Signed)
Spoke with the patient and a request for Temazepam for 15 and Ambien 5 mg came through Epic. I advised him that he could not be taking both because they are both for sleep. He said the Temazepam works better but he has been having to take 2. Per Dr.Lowne ok to send in Temazepam 30 mg po qhs #30 0 refills to the pharmacy. Rx printed, signed and faxed.     KP

## 2015-03-11 ENCOUNTER — Other Ambulatory Visit: Payer: Self-pay | Admitting: Thoracic Surgery (Cardiothoracic Vascular Surgery)

## 2015-03-11 ENCOUNTER — Other Ambulatory Visit: Payer: Self-pay | Admitting: Family Medicine

## 2015-03-14 ENCOUNTER — Other Ambulatory Visit: Payer: Self-pay | Admitting: Thoracic Surgery (Cardiothoracic Vascular Surgery)

## 2015-03-15 ENCOUNTER — Other Ambulatory Visit: Payer: Self-pay | Admitting: *Deleted

## 2015-03-15 MED ORDER — POTASSIUM CHLORIDE CRYS ER 20 MEQ PO TBCR: 20.0000 meq | EXTENDED_RELEASE_TABLET | Freq: Every day | ORAL | Status: DC

## 2015-03-19 ENCOUNTER — Other Ambulatory Visit: Payer: Self-pay | Admitting: Family Medicine

## 2015-03-19 NOTE — Telephone Encounter (Signed)
Last seen 12/17/14 and filled 03/05/15 #20  Please advise     KP

## 2015-03-20 ENCOUNTER — Encounter: Payer: Self-pay | Admitting: *Deleted

## 2015-03-28 ENCOUNTER — Other Ambulatory Visit: Payer: Self-pay | Admitting: Family Medicine

## 2015-04-02 ENCOUNTER — Other Ambulatory Visit: Payer: Self-pay | Admitting: Family Medicine

## 2015-04-02 NOTE — Telephone Encounter (Signed)
Last seen 12/17/14   Temazepam 03/08/15 #30 Xanax 03/19/15 #20   Please advise     KP

## 2015-04-09 ENCOUNTER — Ambulatory Visit (INDEPENDENT_AMBULATORY_CARE_PROVIDER_SITE_OTHER)
Admission: RE | Admit: 2015-04-09 | Discharge: 2015-04-09 | Disposition: A | Discharge status: Home / Self Care | Payer: Medicare Other | Source: Ambulatory Visit | Attending: Internal Medicine | Admitting: Internal Medicine

## 2015-04-09 DIAGNOSIS — J920 Pleural plaque with presence of asbestos: Secondary | ICD-10-CM | POA: Diagnosis not present

## 2015-04-09 DIAGNOSIS — R918 Other nonspecific abnormal finding of lung field: Secondary | ICD-10-CM

## 2015-04-09 NOTE — Progress Notes (Signed)
Quick Note:  Spoke with pt and notified of results per Dr. Wert. Pt verbalized understanding and denied any questions.  ______ 

## 2015-04-11 ENCOUNTER — Other Ambulatory Visit: Payer: Self-pay | Admitting: Family Medicine

## 2015-04-12 NOTE — Telephone Encounter (Signed)
Last seen 12/17/14 and filled 04/02/15 #20   Please advise    KP

## 2015-04-16 ENCOUNTER — Other Ambulatory Visit: Payer: Self-pay | Admitting: Family Medicine

## 2015-04-16 NOTE — Telephone Encounter (Signed)
Rx was stopped a few months ago when the patient was release from the Hospital. Please advise if this refill is appropriate.     KP

## 2015-04-25 ENCOUNTER — Other Ambulatory Visit: Payer: Self-pay | Admitting: Family Medicine

## 2015-04-25 ENCOUNTER — Telehealth: Payer: Self-pay | Admitting: General Practice

## 2015-04-25 MED ORDER — DICLOFENAC SODIUM 1 % TD GEL: 2.0000 g | Freq: Four times a day (QID) | TRANSDERMAL | Status: DC

## 2015-04-25 NOTE — Telephone Encounter (Signed)
yes

## 2015-04-25 NOTE — Telephone Encounter (Signed)
Pr pt insurance pt cannot have name brand Voltaren gel, will be covered by insurance if generic is filled. Ok to change to generic?

## 2015-04-25 NOTE — Telephone Encounter (Signed)
Last seen 12/17/14 and filled 04/02/15 #30   Please advise     KP

## 2015-04-25 NOTE — Telephone Encounter (Signed)
New medication filled to pharmacy and pt informed.

## 2015-04-26 ENCOUNTER — Other Ambulatory Visit: Payer: Self-pay | Admitting: Family Medicine

## 2015-04-26 NOTE — Telephone Encounter (Signed)
Last seen 12/17/14 and filled 04/12/15 #20   Please advise    KP

## 2015-04-29 ENCOUNTER — Telehealth: Payer: Self-pay | Admitting: *Deleted

## 2015-04-29 NOTE — Telephone Encounter (Signed)
-----   Message from Algernon Huxley, RN sent at 01/29/2015  1:45 PM EST ----- Regarding: CBC Pt needs CBC in Feb. Order in epic.

## 2015-04-29 NOTE — Telephone Encounter (Signed)
Patient will come for labs.  

## 2015-05-02 ENCOUNTER — Other Ambulatory Visit (INDEPENDENT_AMBULATORY_CARE_PROVIDER_SITE_OTHER): Payer: Medicare Other

## 2015-05-02 ENCOUNTER — Other Ambulatory Visit: Payer: Self-pay

## 2015-05-02 DIAGNOSIS — D509 Iron deficiency anemia, unspecified: Secondary | ICD-10-CM

## 2015-05-02 LAB — CBC WITH DIFFERENTIAL/PLATELET
BASOS PCT: 0.6 % (ref 0.0–3.0)
Basophils Absolute: 0.1 10*3/uL (ref 0.0–0.1)
EOS PCT: 7 % — AB (ref 0.0–5.0)
Eosinophils Absolute: 0.7 10*3/uL (ref 0.0–0.7)
HCT: 46 % (ref 39.0–52.0)
Hemoglobin: 15.4 g/dL (ref 13.0–17.0)
LYMPHS ABS: 1.6 10*3/uL (ref 0.7–4.0)
Lymphocytes Relative: 15.7 % (ref 12.0–46.0)
MCHC: 33.5 g/dL (ref 30.0–36.0)
MCV: 95.6 fl (ref 78.0–100.0)
MONO ABS: 0.9 10*3/uL (ref 0.1–1.0)
Monocytes Relative: 9.5 % (ref 3.0–12.0)
NEUTROS ABS: 6.7 10*3/uL (ref 1.4–7.7)
NEUTROS PCT: 67.2 % (ref 43.0–77.0)
PLATELETS: 197 10*3/uL (ref 150.0–400.0)
RBC: 4.81 Mil/uL (ref 4.22–5.81)
RDW: 14.5 % (ref 11.5–15.5)
WBC: 9.9 10*3/uL (ref 4.0–10.5)

## 2015-05-08 ENCOUNTER — Telehealth: Payer: Self-pay | Admitting: *Deleted

## 2015-05-08 NOTE — Telephone Encounter (Signed)
Unable to reach patient at time of pre-visit call. Voicemail box not set up, unable to leave message.

## 2015-05-09 ENCOUNTER — Ambulatory Visit (INDEPENDENT_AMBULATORY_CARE_PROVIDER_SITE_OTHER): Payer: Medicare Other | Admitting: Family Medicine

## 2015-05-09 ENCOUNTER — Encounter: Payer: Self-pay | Admitting: Family Medicine

## 2015-05-09 VITALS — BP 116/82 | HR 82 | Temp 97.5°F | Ht 62.75 in | Wt 140.4 lb

## 2015-05-09 DIAGNOSIS — Z0000 Encounter for general adult medical examination without abnormal findings: Secondary | ICD-10-CM | POA: Diagnosis not present

## 2015-05-09 DIAGNOSIS — F411 Generalized anxiety disorder: Secondary | ICD-10-CM

## 2015-05-09 DIAGNOSIS — I10 Essential (primary) hypertension: Secondary | ICD-10-CM | POA: Diagnosis not present

## 2015-05-09 DIAGNOSIS — R0989 Other specified symptoms and signs involving the circulatory and respiratory systems: Secondary | ICD-10-CM

## 2015-05-09 DIAGNOSIS — G4700 Insomnia, unspecified: Secondary | ICD-10-CM | POA: Diagnosis not present

## 2015-05-09 DIAGNOSIS — D500 Iron deficiency anemia secondary to blood loss (chronic): Secondary | ICD-10-CM | POA: Diagnosis not present

## 2015-05-09 DIAGNOSIS — F17210 Nicotine dependence, cigarettes, uncomplicated: Secondary | ICD-10-CM

## 2015-05-09 DIAGNOSIS — K5521 Angiodysplasia of colon with hemorrhage: Secondary | ICD-10-CM

## 2015-05-09 DIAGNOSIS — R350 Frequency of micturition: Secondary | ICD-10-CM | POA: Diagnosis not present

## 2015-05-09 DIAGNOSIS — E785 Hyperlipidemia, unspecified: Secondary | ICD-10-CM

## 2015-05-09 DIAGNOSIS — K219 Gastro-esophageal reflux disease without esophagitis: Secondary | ICD-10-CM

## 2015-05-09 DIAGNOSIS — Z720 Tobacco use: Secondary | ICD-10-CM

## 2015-05-09 DIAGNOSIS — K558 Other vascular disorders of intestine: Secondary | ICD-10-CM

## 2015-05-09 LAB — CBC WITH DIFFERENTIAL/PLATELET
BASOS PCT: 0.7 % (ref 0.0–3.0)
Basophils Absolute: 0.1 10*3/uL (ref 0.0–0.1)
EOS PCT: 3.9 % (ref 0.0–5.0)
Eosinophils Absolute: 0.4 10*3/uL (ref 0.0–0.7)
HEMATOCRIT: 45.6 % (ref 39.0–52.0)
HEMOGLOBIN: 15.6 g/dL (ref 13.0–17.0)
LYMPHS PCT: 8.3 % — AB (ref 12.0–46.0)
Lymphs Abs: 0.9 10*3/uL (ref 0.7–4.0)
MCHC: 34.2 g/dL (ref 30.0–36.0)
MCV: 94.9 fl (ref 78.0–100.0)
MONO ABS: 1 10*3/uL (ref 0.1–1.0)
MONOS PCT: 9.5 % (ref 3.0–12.0)
NEUTROS PCT: 77.6 % — AB (ref 43.0–77.0)
Neutro Abs: 8.5 10*3/uL — ABNORMAL HIGH (ref 1.4–7.7)
PLATELETS: 191 10*3/uL (ref 150.0–400.0)
RBC: 4.8 Mil/uL (ref 4.22–5.81)
RDW: 14.1 % (ref 11.5–15.5)
WBC: 10.9 10*3/uL — AB (ref 4.0–10.5)

## 2015-05-09 LAB — POCT URINALYSIS DIPSTICK
Bilirubin, UA: NEGATIVE
GLUCOSE UA: NEGATIVE
Ketones, UA: NEGATIVE
LEUKOCYTES UA: NEGATIVE
NITRITE UA: NEGATIVE
PH UA: 6
Protein, UA: NEGATIVE
RBC UA: NEGATIVE
Spec Grav, UA: 1.015
UROBILINOGEN UA: 0.2

## 2015-05-09 LAB — COMPREHENSIVE METABOLIC PANEL
ALK PHOS: 69 U/L (ref 39–117)
ALT: 52 U/L (ref 0–53)
AST: 36 U/L (ref 0–37)
Albumin: 4.6 g/dL (ref 3.5–5.2)
BILIRUBIN TOTAL: 0.8 mg/dL (ref 0.2–1.2)
BUN: 21 mg/dL (ref 6–23)
CO2: 28 meq/L (ref 19–32)
CREATININE: 0.81 mg/dL (ref 0.40–1.50)
Calcium: 10.2 mg/dL (ref 8.4–10.5)
Chloride: 101 mEq/L (ref 96–112)
GFR: 98.28 mL/min (ref >60.00)
GLUCOSE: 106 mg/dL — AB (ref 70–99)
Potassium: 4.2 mEq/L (ref 3.5–5.1)
SODIUM: 139 meq/L (ref 135–145)
TOTAL PROTEIN: 7.7 g/dL (ref 6.0–8.3)

## 2015-05-09 LAB — LIPID PANEL
Cholesterol: 212 mg/dL — ABNORMAL HIGH (ref 0–200)
HDL: 60.8 mg/dL (ref >39.00)
NONHDL: 151.62
Total CHOL/HDL Ratio: 3
Triglycerides: 270 mg/dL — ABNORMAL HIGH (ref 0.0–149.0)
VLDL: 54 mg/dL — ABNORMAL HIGH (ref 0.0–40.0)

## 2015-05-09 LAB — LDL CHOLESTEROL, DIRECT: Direct LDL: 114 mg/dL

## 2015-05-09 LAB — PSA: PSA: 1.65 ng/mL (ref 0.10–4.00)

## 2015-05-09 MED ORDER — POTASSIUM CHLORIDE CRYS ER 20 MEQ PO TBCR: 20.0000 meq | EXTENDED_RELEASE_TABLET | Freq: Every day | ORAL | Status: DC

## 2015-05-09 MED ORDER — VALSARTAN 80 MG PO TABS: 80.0000 mg | ORAL_TABLET | Freq: Every day | ORAL | Status: DC

## 2015-05-09 MED ORDER — CITALOPRAM HYDROBROMIDE 10 MG PO TABS: 10.0000 mg | ORAL_TABLET | Freq: Every day | ORAL | Status: DC

## 2015-05-09 MED ORDER — FERROUS SULFATE 325 (65 FE) MG PO TBEC: 325.0000 mg | DELAYED_RELEASE_TABLET | Freq: Two times a day (BID) | ORAL | Status: DC

## 2015-05-09 MED ORDER — TEMAZEPAM 30 MG PO CAPS: 30.0000 mg | ORAL_CAPSULE | Freq: Every evening | ORAL | Status: DC | PRN

## 2015-05-09 MED ORDER — ALPRAZOLAM 0.25 MG PO TABS: 0.2500 mg | ORAL_TABLET | Freq: Two times a day (BID) | ORAL | Status: DC | PRN

## 2015-05-09 MED ORDER — PANTOPRAZOLE SODIUM 40 MG PO TBEC: 40.0000 mg | DELAYED_RELEASE_TABLET | Freq: Every day | ORAL | Status: DC

## 2015-05-09 MED ORDER — FUROSEMIDE 20 MG PO TABS: 40.0000 mg | ORAL_TABLET | Freq: Every day | ORAL | Status: DC

## 2015-05-09 MED ORDER — METOPROLOL TARTRATE 25 MG PO TABS: 12.5000 mg | ORAL_TABLET | Freq: Two times a day (BID) | ORAL | Status: DC

## 2015-05-09 NOTE — Progress Notes (Signed)
Pre visit review using our clinic review tool, if applicable. No additional management support is needed unless otherwise documented below in the visit note. 

## 2015-05-09 NOTE — Patient Instructions (Signed)

## 2015-05-09 NOTE — Progress Notes (Signed)
+  Subjective:   Ronald Bell is a 75 y.o. male who presents for Medicare Annual/Subsequent preventive examination.  Review of Systems:   Review of Systems  Constitutional: Negative for activity change, appetite change and fatigue.  HENT: Negative for hearing loss, congestion, tinnitus and ear discharge.   Eyes: Negative for visual disturbance (see optho q1y -- vision corrected to 20/20 with glasses).  Respiratory: Negative for cough, chest tightness--  Sob --pt is on O2 Cardiovascular: Negative for chest pain, palpitations and leg swelling.  Gastrointestinal: Negative for abdominal pain, diarrhea, constipation and abdominal distention.  Genitourinary: Negative for urgency, frequency, decreased urine volume and difficulty urinating.  Musculoskeletal: Negative for back pain, arthralgias and gait problem.  Skin: Negative for color change, pallor and rash.  Neurological: Negative for dizziness, light-headedness, numbness and headaches.  Hematological: Negative for adenopathy. Does not bruise/bleed easily.  Psychiatric/Behavioral: Negative for suicidal ideas, confusion, sleep disturbance, self-injury, dysphoric mood, decreased concentration and agitation.  Pt is able to read and write and can do all ADLs No risk for falling No abuse/ violence in home           Objective:    Vitals: BP 116/82 mmHg  Pulse 82  Temp(Src) 97.5 F (36.4 C) (Oral)  Ht 5' 2.75" (1.594 m)  Wt 140 lb 6.4 oz (63.685 kg)  BMI 25.06 kg/m2  SpO2 96% BP 116/82 mmHg  Pulse 82  Temp(Src) 97.5 F (36.4 C) (Oral)  Ht 5' 2.75" (1.594 m)  Wt 140 lb 6.4 oz (63.685 kg)  BMI 25.06 kg/m2  SpO2 96% General appearance: alert, cooperative, appears stated age and no distress Head: Normocephalic, without obvious abnormality, atraumatic Eyes: negative findings: lids and lashes normal and pupils equal, round, reactive to light and accomodation Ears: normal TM's and external ear canals both ears Nose: Nares  normal. Septum midline. Mucosa normal. No drainage or sinus tenderness. Throat: lips, mucosa, and tongue normal; teeth and gums normal Neck: no adenopathy, supple, symmetrical, trachea midline, thyroid not enlarged, symmetric, no tenderness/mass/nodules and b/u bruit Back: symmetric, no curvature. ROM normal. No CVA tenderness. Lungs: clear to auscultation bilaterally Chest wall: no tenderness Heart: 2/6 murmur Abdomen: soft, non-tender; bowel sounds normal; no masses,  no organomegaly Male genitalia: normal, penis: no lesions or discharge. testes: no masses or tenderness. no hernias Rectal: deferred Extremities: extremities normal, atraumatic, no cyanosis or edema Pulses: 2+ and symmetric Skin: Skin color, texture, turgor normal. No rashes or lesions Lymph nodes: Cervical, supraclavicular, and axillary nodes normal. Neurologic: Alert and oriented X 3, normal strength and tone. Normal symmetric reflexes. Normal coordination and gait Psych- no depression, no anxiety Tobacco History  Smoking status  . Former Smoker -- 1.00 packs/day for 60 years  . Types: Cigarettes, E-cigarettes  . Quit date: 04/18/2015  Smokeless tobacco  . Never Used    Comment: Patient is using the patch     Counseling given: Not Answered   Past Medical History  Diagnosis Date  . Polyp of nasal cavity   . Rosacea   . Hx of adenomatous colonic polyps 2012, 2013.   . Tobacco abuse   . Hypertension   . Hyperlipidemia   . GERD (gastroesophageal reflux disease)   . GI bleed 2010    4 units PRBCs  . Peripheral vascular disease ([place])   . Irregular heartbeat   . Shingles   . Carotid artery occlusion   . Angiodysplasia of intestine with hemorrhage     large and SB, gastric AVMs.   Marland Kitchen  Pleural plaque with presence of asbestos 03/27/2013    Followed in Pulmonary clinic/ [facility]/ [place] - F/u CT 09/08/2013 1. Stable extensive calcified pleural plaque formation consistent with asbestos exposure. 2. Multiple  pulmonary nodules are unchanged from the CT of 6 months ago. Given risk factors for lung cancer, continued follow up is recommended with chest CT in 6 months> done 04/20/14 no change >repeat in 12 m in tickle file     . GERD 11/30/2008    Qualifier: Diagnosis of  By: Marijean Niemann CMA, Danielle    . PVD (peripheral vascular disease) ([place]) 10/18/2012  . Carotid artery stenosis 04/22/2012  . Coronary artery disease   . COPD GOLD III with min reversibilty  08/11/2006    Followed in Pulmonary clinic/ [facility]/ [place] - PFT's 04/28/2013  FEV1 0.88 (40%) with ratio 44 and 14% better p B2 dlco 45 corrects to 83 - Trial of breo 04/28/2013 > improved symptoms  06/09/2013  - spirometry 06/04/2014 FEV1  0.76 (29%) ratio 45      . Iron deficiency anemia 01/25/2009    Qualifier: Diagnosis of  By: Henrene Pastor MD, Docia Chuck   . History of blood transfusion "couple times"    "related to bleeding in colon and esophagus"  . Arthritis     "left shoulder" (10/19/2014)  . Anxiety   . 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 Iliac artery stent Left 2005    CIA  . Esophagogastroduodenoscopy  2012    normal  . Colonoscopy  July 2015    Dr. Henrene Pastor  . Tonsillectomy    . Knee arthroscopy with medial menisectomy Left 03/08/2014    Procedure: LEFT KNEE SCOPE WITH MEDIAL MENISECTOMY AND CHONDROPLASTY;  Surgeon: Ninetta Lights, MD;  Location: [place];  Service: Orthopedics;  Laterality: Left;  . Left and right heart catheterization with coronary angiogram N/A 06/28/2014    Procedure: LEFT AND RIGHT HEART CATHETERIZATION WITH CORONARY ANGIOGRAM;  Surgeon: Jerline Pain, MD;  Location: [facility] CATH LAB;  Service: Cardiovascular;  Laterality: N/A;  . Esophagogastroduodenoscopy N/A 08/17/2014    Procedure: ESOPHAGOGASTRODUODENOSCOPY (EGD);  Surgeon: Jerene Bears, MD;  Location[facility] ENDOSCOPY;   Service: Endoscopy;  Laterality: N/A;  . Colonoscopy N/A 08/17/2014    Procedure: COLONOSCOPY;  Surgeon: Jerene Bears, MD;  Location: [facility] ENDOSCOPY;  Service: Endoscopy;  Laterality: N/A;  . Cardiac catheterization  2001; 06/28/2014  . Cardiac catheterization N/A 10/19/2014    Procedure: Coronary Stent Intervention;  Surgeon: Burnell Blanks, MD;  Location: [place] CV LAB;  Service: Cardiovascular;  Laterality: N/A;  BMS Mid RCA  . Transcatheter aortic valve replacement, transapical N/A 11/20/2014    Procedure: TRANSCATHETER AORTIC VALVE REPLACEMENT, TRANSAPICAL;  Surgeon: [name], MD;  Location: [place];  Service: Open Heart Surgery;  Laterality: N/A;  . Tee without cardioversion N/A 11/20/2014    Procedure: TRANSESOPHAGEAL ECHOCARDIOGRAM (TEE);  Surgeon: [name], MD;  Location: [place];  Service: Open Heart Surgery;  Laterality: N/A;  . Rib plating Left 11/20/2014    Procedure: RIB PLATING OF LEFT 8TH RIB;  Surgeon: [name], MD;  Location: [place];  Service: Open Heart Surgery;  Laterality: Left;  . Colonoscopy N/A 12/05/2014    Procedure: COLONOSCOPY;  Surgeon: Manus Gunning, MD;  Location: [place];  Service: Gastroenterology;  Laterality: N/A;  .  Enteroscopy N/A 12/05/2014    Procedure: ENTEROSCOPY;  Surgeon: Manus Gunning, MD;  Location: [place];  Service: Gastroenterology;  Laterality: N/A;   Family History  Problem Relation Age of Onset  . Lung disease Mother     pulm fibrosis  . Colitis Father   . Colon cancer Neg Hx   . Heart disease Brother   . Hypertension Brother   . Hyperlipidemia Brother   . Heart disease Daughter     cad  . Hypertension Son    History  Sexual Activity  . Sexual Activity: No    Outpatient Encounter Prescriptions as of 05/09/2015  Medication Sig  . albuterol (PROVENTIL HFA;VENTOLIN HFA) 108 (90 BASE) MCG/ACT inhaler Inhale 2 puffs into the lungs every 4 (four) hours as needed for wheezing or shortness of  breath.  . ALPRAZolam (XANAX) 0.25 MG tablet Take 1 tablet (0.25 mg total) by mouth 2 (two) times daily as needed. for anxiety  . Ascorbic Acid (VITAMIN C) 250 MG tablet Take 500 mg by mouth daily.   Marland Kitchen aspirin 81 MG chewable tablet Chew 1 tablet (81 mg total) by mouth daily.  Marland Kitchen atorvastatin (LIPITOR) 40 MG tablet TAKE ONE TABLET BY MOUTH ONCE DAILY  . diclofenac sodium (VOLTAREN) 1 % GEL Apply 2 g topically 4 (four) times daily.  Marland Kitchen docusate sodium (COLACE) 100 MG capsule Take 100 mg by mouth 2 (two) times daily.   . ferrous sulfate 325 (65 FE) MG EC tablet Take 1 tablet (325 mg total) by mouth 2 (two) times daily.  . furosemide (LASIX) 20 MG tablet Take 2 tablets (40 mg total) by mouth daily.  . metoprolol tartrate (LOPRESSOR) 25 MG tablet Take 0.5 tablets (12.5 mg total) by mouth 2 (two) times daily.  . Multiple Vitamins-Minerals (CENTRUM SILVER PO) Take 1 tablet by mouth daily.   . pantoprazole (PROTONIX) 40 MG tablet Take 1 tablet (40 mg total) by mouth daily.  . potassium chloride SA (K-DUR,KLOR-CON) 20 MEQ tablet Take 1 tablet (20 mEq total) by mouth daily.  . temazepam (RESTORIL) 30 MG capsule Take 1 capsule (30 mg total) by mouth at bedtime as needed. for sleep  . Umeclidinium-Vilanterol (ANORO ELLIPTA) 62.5-25 MCG/INH AEPB 1 puff once daily every morning  . valsartan (DIOVAN) 80 MG tablet Take 1 tablet (80 mg total) by mouth daily.  . [DISCONTINUED] ALPRAZolam (XANAX) 0.25 MG tablet TAKE ONE TABLET BY MOUTH TWICE DAILY AS NEEDED FOR ANXIETY  . [DISCONTINUED] ferrous sulfate 325 (65 FE) MG EC tablet Take 1 tablet (325 mg total) by mouth 2 (two) times daily.  . [DISCONTINUED] furosemide (LASIX) 20 MG tablet Take 2 tablets (40 mg total) by mouth daily.  . [DISCONTINUED] metoprolol tartrate (LOPRESSOR) 25 MG tablet Take 0.5 tablets (12.5 mg total) by mouth 2 (two) times daily.  . [DISCONTINUED] pantoprazole (PROTONIX) 40 MG tablet Take 40 mg by mouth daily.  . [DISCONTINUED] potassium  chloride SA (K-DUR,KLOR-CON) 20 MEQ tablet Take 1 tablet (20 mEq total) by mouth daily.  . [DISCONTINUED] temazepam (RESTORIL) 30 MG capsule TAKE ONE CAPSULE BY MOUTH AT BEDTIME AS NEEDED FOR SLEEP  . [DISCONTINUED] valsartan (DIOVAN) 80 MG tablet TAKE ONE TABLET BY MOUTH ONCE DAILY  . citalopram (CELEXA) 10 MG tablet Take 1 tablet (10 mg total) by mouth daily.  . [DISCONTINUED] Tiotropium Bromide-Olodaterol (STIOLTO RESPIMAT) 2.5-2.5 MCG/ACT AERS Inhale 2 puffs into the lungs daily. (Patient not taking: Reported on 02/22/2015)  . [DISCONTINUED] zolpidem (AMBIEN) 5 MG tablet TAKE ONE TABLET BY  MOUTH AT BEDTIME AS NEEDED   No facility-administered encounter medications on file as of 05/09/2015.    Activities of Daily Living In your present state of health, do you have any difficulty performing the following activities: 12/03/2014 11/20/2014  Hearing? Tempie Donning  Vision? N N  Difficulty concentrating or making decisions? N Y  Walking or climbing stairs? Y N  Dressing or bathing? Y N  Doing errands, shopping? N N    Patient Care Team: Rosalita Chessman, DO as PCP - General Rosetta Posner, MD as PCP - Cardiology (Vascular Surgery) Jerline Pain, MD as Attending Physician (Cardiology) Irene Shipper, MD as Consulting Physician (Gastroenterology) [name], MD as Consulting Physician (Cardiothoracic Surgery)   Assessment:    CPE Exercise Activities and Dietary recommendations---nothing regular----  Leisurely walking to mail box or in grocery store    Goals    . Quit smoking / using tobacco      Fall Risk Fall Risk  05/09/2015 03/05/2014 02/17/2013 02/12/2012  Falls in the past year? No No No No   Depression Screen PHQ 2/9 Scores 05/09/2015 03/05/2014 02/17/2013 02/12/2012  PHQ - 2 Score 1 0 0 0    Cognitive Testing mmse 30/30  Immunization History  Administered Date(s) Administered  . Influenza Split 01/23/2011, 02/12/2012  . Influenza Whole 01/28/2007, 12/14/2007, 12/14/2008,  01/17/2010  . Influenza, High Dose Seasonal PF 12/17/2014  . Influenza,inj,Quad PF,36+ Mos 02/17/2013  . Influenza-Unspecified 01/09/2014  . Pneumococcal Conjugate-13 05/10/2014  . Pneumococcal Polysaccharide-23 12/18/2003, 11/30/2008  . Td 03/23/2002  . Tdap 03/03/2013  . Zoster 01/23/2011   Screening Tests Health Maintenance  Topic Date Due  . INFLUENZA VACCINE  10/22/2015  . COLONOSCOPY  12/04/2016  . TETANUS/TDAP  03/04/2023  . ZOSTAVAX  Completed  . PNA vac Low Risk Adult  Completed      Plan:    During the course of the visit the patient was educated and counseled about the following appropriate screening and preventive services:   Vaccines to include Pneumoccal, Influenza, Hepatitis B, Td, Zostavax, HCV  Electrocardiogram  Cardiovascular Disease  Colorectal cancer screening  Diabetes screening  Prostate Cancer Screening  Glaucoma screening  Nutrition counseling   Smoking cessation counseling  Patient Instructions (the written plan) was given to the patient.   1. Generalized anxiety disorder   - citalopram (CELEXA) 10 MG tablet; Take 1 tablet (10 mg total) by mouth daily.  Dispense: 30 tablet; Refill: 3 - ALPRAZolam (XANAX) 0.25 MG tablet; Take 1 tablet (0.25 mg total) by mouth 2 (two) times daily as needed. for anxiety  Dispense: 45 tablet; Refill: 0 - Comp Met (CMET) - Lipid panel - CBC with Differential/Platelet - POCT urinalysis dipstick - PSA  2. Hyperlipidemia   - Comp Met (CMET) - Lipid panel - CBC with Differential/Platelet - POCT urinalysis dipstick - PSA  3. Essential hypertension   - Comp Met (CMET) - Lipid panel - CBC with Differential/Platelet - POCT urinalysis dipstick - PSA  4. Medicare annual wellness visit, subsequent See AVS   5. Urinary frequency   - PSA  [name], DO  05/12/2015

## 2015-05-12 NOTE — Assessment & Plan Note (Signed)
con't metoprolol and diovan stable 

## 2015-05-12 NOTE — Assessment & Plan Note (Signed)
Pt has quit-- is using the patch His wife still smokes in the house

## 2015-05-12 NOTE — Assessment & Plan Note (Addendum)
Pt always with heme + stool con't iron supp

## 2015-05-12 NOTE — Assessment & Plan Note (Signed)
Pt needs f/u with vascular

## 2015-05-14 DIAGNOSIS — R069 Unspecified abnormalities of breathing: Secondary | ICD-10-CM | POA: Diagnosis not present

## 2015-05-15 ENCOUNTER — Other Ambulatory Visit: Payer: Self-pay

## 2015-05-15 MED ORDER — FENOFIBRATE 160 MG PO TABS: 160.0000 mg | ORAL_TABLET | Freq: Every day | ORAL | Status: DC

## 2015-05-17 DIAGNOSIS — R069 Unspecified abnormalities of breathing: Secondary | ICD-10-CM | POA: Diagnosis not present

## 2015-05-18 ENCOUNTER — Inpatient Hospital Stay (HOSPITAL_COMMUNITY)
Admission: EM | Admit: 2015-05-18 | Discharge: 2015-05-20 | DRG: 190 | Disposition: A | Discharge status: Home / Self Care | Payer: Medicare Other | Attending: Internal Medicine | Admitting: Internal Medicine

## 2015-05-18 ENCOUNTER — Emergency Department (HOSPITAL_COMMUNITY): Payer: Medicare Other

## 2015-05-18 ENCOUNTER — Encounter (HOSPITAL_COMMUNITY): Payer: Self-pay

## 2015-05-18 DIAGNOSIS — Z9981 Dependence on supplemental oxygen: Secondary | ICD-10-CM

## 2015-05-18 DIAGNOSIS — Z77090 Contact with and (suspected) exposure to asbestos: Secondary | ICD-10-CM | POA: Diagnosis present

## 2015-05-18 DIAGNOSIS — I739 Peripheral vascular disease, unspecified: Secondary | ICD-10-CM | POA: Diagnosis present

## 2015-05-18 DIAGNOSIS — F411 Generalized anxiety disorder: Secondary | ICD-10-CM | POA: Diagnosis present

## 2015-05-18 DIAGNOSIS — I10 Essential (primary) hypertension: Secondary | ICD-10-CM | POA: Diagnosis present

## 2015-05-18 DIAGNOSIS — F329 Major depressive disorder, single episode, unspecified: Secondary | ICD-10-CM | POA: Diagnosis not present

## 2015-05-18 DIAGNOSIS — Z952 Presence of prosthetic heart valve: Secondary | ICD-10-CM

## 2015-05-18 DIAGNOSIS — F32A Depression, unspecified: Secondary | ICD-10-CM

## 2015-05-18 DIAGNOSIS — R0602 Shortness of breath: Secondary | ICD-10-CM | POA: Diagnosis not present

## 2015-05-18 DIAGNOSIS — K210 Gastro-esophageal reflux disease with esophagitis: Secondary | ICD-10-CM | POA: Diagnosis present

## 2015-05-18 DIAGNOSIS — Z87891 Personal history of nicotine dependence: Secondary | ICD-10-CM | POA: Diagnosis not present

## 2015-05-18 DIAGNOSIS — J441 Chronic obstructive pulmonary disease with (acute) exacerbation: Secondary | ICD-10-CM | POA: Diagnosis not present

## 2015-05-18 DIAGNOSIS — R740 Nonspecific elevation of levels of transaminase and lactic acid dehydrogenase [LDH]: Secondary | ICD-10-CM | POA: Diagnosis present

## 2015-05-18 DIAGNOSIS — Z7982 Long term (current) use of aspirin: Secondary | ICD-10-CM

## 2015-05-18 DIAGNOSIS — K219 Gastro-esophageal reflux disease without esophagitis: Secondary | ICD-10-CM | POA: Diagnosis present

## 2015-05-18 DIAGNOSIS — I350 Nonrheumatic aortic (valve) stenosis: Secondary | ICD-10-CM | POA: Diagnosis not present

## 2015-05-18 DIAGNOSIS — D509 Iron deficiency anemia, unspecified: Secondary | ICD-10-CM | POA: Diagnosis present

## 2015-05-18 DIAGNOSIS — Z7902 Long term (current) use of antithrombotics/antiplatelets: Secondary | ICD-10-CM | POA: Diagnosis not present

## 2015-05-18 DIAGNOSIS — Z95820 Peripheral vascular angioplasty status with implants and grafts: Secondary | ICD-10-CM

## 2015-05-18 DIAGNOSIS — Q2733 Arteriovenous malformation of digestive system vessel: Secondary | ICD-10-CM | POA: Diagnosis not present

## 2015-05-18 DIAGNOSIS — I2510 Atherosclerotic heart disease of native coronary artery without angina pectoris: Secondary | ICD-10-CM | POA: Diagnosis not present

## 2015-05-18 DIAGNOSIS — J9621 Acute and chronic respiratory failure with hypoxia: Secondary | ICD-10-CM | POA: Diagnosis not present

## 2015-05-18 DIAGNOSIS — Z86010 Personal history of colonic polyps: Secondary | ICD-10-CM | POA: Diagnosis not present

## 2015-05-18 DIAGNOSIS — R7989 Other specified abnormal findings of blood chemistry: Secondary | ICD-10-CM

## 2015-05-18 DIAGNOSIS — J9620 Acute and chronic respiratory failure, unspecified whether with hypoxia or hypercapnia: Secondary | ICD-10-CM

## 2015-05-18 DIAGNOSIS — E785 Hyperlipidemia, unspecified: Secondary | ICD-10-CM | POA: Diagnosis present

## 2015-05-18 DIAGNOSIS — Z955 Presence of coronary angioplasty implant and graft: Secondary | ICD-10-CM | POA: Diagnosis not present

## 2015-05-18 LAB — BASIC METABOLIC PANEL
ANION GAP: 14 (ref 5–15)
BUN: 22 mg/dL — ABNORMAL HIGH (ref 6–20)
CHLORIDE: 101 mmol/L (ref 101–111)
CO2: 26 mmol/L (ref 22–32)
Calcium: 10.3 mg/dL (ref 8.9–10.3)
Creatinine, Ser: 1.09 mg/dL (ref 0.61–1.24)
Glucose, Bld: 136 mg/dL — ABNORMAL HIGH (ref 65–99)
POTASSIUM: 4.4 mmol/L (ref 3.5–5.1)
SODIUM: 141 mmol/L (ref 135–145)

## 2015-05-18 LAB — CBC WITH DIFFERENTIAL/PLATELET
BASOS ABS: 0.1 10*3/uL (ref 0.0–0.1)
BASOS PCT: 1 %
Eosinophils Absolute: 0.4 10*3/uL (ref 0.0–0.7)
Eosinophils Relative: 4 %
HEMATOCRIT: 44.8 % (ref 39.0–52.0)
HEMOGLOBIN: 14.8 g/dL (ref 13.0–17.0)
LYMPHS PCT: 19 %
Lymphs Abs: 1.8 10*3/uL (ref 0.7–4.0)
MCH: 32.4 pg (ref 26.0–34.0)
MCHC: 33 g/dL (ref 30.0–36.0)
MCV: 98 fL (ref 78.0–100.0)
MONO ABS: 1.1 10*3/uL — AB (ref 0.1–1.0)
Monocytes Relative: 12 %
NEUTROS ABS: 6 10*3/uL (ref 1.7–7.7)
NEUTROS PCT: 64 %
Platelets: 191 10*3/uL (ref 150–400)
RBC: 4.57 MIL/uL (ref 4.22–5.81)
RDW: 13.5 % (ref 11.5–15.5)
WBC: 9.4 10*3/uL (ref 4.0–10.5)

## 2015-05-18 LAB — LACTIC ACID, PLASMA
Lactic Acid, Venous: 4 mmol/L (ref 0.5–2.0)
Lactic Acid, Venous: 3.3 mmol/L (ref 0.5–2.0)

## 2015-05-18 LAB — TROPONIN I
TROPONIN I: 0.03 ng/mL (ref <0.031)
TROPONIN I: 0.04 ng/mL — AB (ref <0.031)

## 2015-05-18 LAB — I-STAT TROPONIN, ED: TROPONIN I, POC: 0.01 ng/mL (ref 0.00–0.08)

## 2015-05-18 LAB — BRAIN NATRIURETIC PEPTIDE: B NATRIURETIC PEPTIDE 5: 50.4 pg/mL (ref 0.0–100.0)

## 2015-05-18 LAB — I-STAT CG4 LACTIC ACID, ED
LACTIC ACID, VENOUS: 0.99 mmol/L (ref 0.5–2.0)
LACTIC ACID, VENOUS: 2.25 mmol/L — AB (ref 0.5–2.0)

## 2015-05-18 MED ORDER — HYDROCODONE-ACETAMINOPHEN 5-325 MG PO TABS: 1.0000 | ORAL_TABLET | ORAL | Status: DC | PRN

## 2015-05-18 MED ORDER — CITALOPRAM HYDROBROMIDE 20 MG PO TABS
10.0000 mg | ORAL_TABLET | Freq: Every day | ORAL | Status: DC
  Administered 2015-05-18 – 2015-05-20 (×3): 10 mg via ORAL
  Filled 2015-05-18 (×3): qty 1

## 2015-05-18 MED ORDER — ACETAMINOPHEN 650 MG RE SUPP: 650.0000 mg | Freq: Four times a day (QID) | RECTAL | Status: DC | PRN

## 2015-05-18 MED ORDER — METOPROLOL TARTRATE 12.5 MG HALF TABLET
12.5000 mg | ORAL_TABLET | Freq: Two times a day (BID) | ORAL | Status: DC
  Administered 2015-05-18 – 2015-05-20 (×5): 12.5 mg via ORAL
  Filled 2015-05-18 (×5): qty 1

## 2015-05-18 MED ORDER — DOCUSATE SODIUM 100 MG PO CAPS
100.0000 mg | ORAL_CAPSULE | Freq: Two times a day (BID) | ORAL | Status: DC
  Administered 2015-05-18 – 2015-05-20 (×5): 100 mg via ORAL
  Filled 2015-05-18 (×5): qty 1

## 2015-05-18 MED ORDER — IPRATROPIUM-ALBUTEROL 0.5-2.5 (3) MG/3ML IN SOLN
3.0000 mL | Freq: Once | RESPIRATORY_TRACT | Status: AC
  Administered 2015-05-18: 3 mL via RESPIRATORY_TRACT
  Filled 2015-05-18: qty 3

## 2015-05-18 MED ORDER — LEVOFLOXACIN IN D5W 500 MG/100ML IV SOLN
500.0000 mg | Freq: Once | INTRAVENOUS | Status: AC
  Administered 2015-05-18: 500 mg via INTRAVENOUS
  Filled 2015-05-18: qty 100

## 2015-05-18 MED ORDER — IPRATROPIUM-ALBUTEROL 0.5-2.5 (3) MG/3ML IN SOLN
3.0000 mL | Freq: Once | RESPIRATORY_TRACT | Status: AC
Start: 2015-05-18 — End: 2015-05-18
  Administered 2015-05-18: 3 mL via RESPIRATORY_TRACT
  Filled 2015-05-18: qty 3

## 2015-05-18 MED ORDER — METHYLPREDNISOLONE SODIUM SUCC 40 MG IJ SOLR
40.0000 mg | Freq: Two times a day (BID) | INTRAMUSCULAR | Status: DC
  Administered 2015-05-18 – 2015-05-19 (×3): 40 mg via INTRAVENOUS
  Filled 2015-05-18 (×3): qty 1

## 2015-05-18 MED ORDER — SENNOSIDES-DOCUSATE SODIUM 8.6-50 MG PO TABS: 1.0000 | ORAL_TABLET | Freq: Every evening | ORAL | Status: DC | PRN

## 2015-05-18 MED ORDER — ALBUTEROL (5 MG/ML) CONTINUOUS INHALATION SOLN
10.0000 mg/h | INHALATION_SOLUTION | Freq: Once | RESPIRATORY_TRACT | Status: AC
  Administered 2015-05-18: 10 mg/h via RESPIRATORY_TRACT
  Filled 2015-05-18: qty 20

## 2015-05-18 MED ORDER — SODIUM CHLORIDE 0.9 % IV BOLUS (SEPSIS)
500.0000 mL | Freq: Once | INTRAVENOUS | Status: AC
  Administered 2015-05-18: 500 mL via INTRAVENOUS

## 2015-05-18 MED ORDER — ASPIRIN 81 MG PO CHEW
81.0000 mg | CHEWABLE_TABLET | Freq: Every day | ORAL | Status: DC
  Administered 2015-05-18 – 2015-05-20 (×3): 81 mg via ORAL
  Filled 2015-05-18 (×3): qty 1

## 2015-05-18 MED ORDER — TRAZODONE HCL 50 MG PO TABS: 25.0000 mg | ORAL_TABLET | Freq: Every evening | ORAL | Status: DC | PRN

## 2015-05-18 MED ORDER — IPRATROPIUM-ALBUTEROL 0.5-2.5 (3) MG/3ML IN SOLN
3.0000 mL | RESPIRATORY_TRACT | Status: DC
  Administered 2015-05-18 (×3): 3 mL via RESPIRATORY_TRACT
  Filled 2015-05-18 (×2): qty 3
  Filled 2015-05-18: qty 39

## 2015-05-18 MED ORDER — ACETAMINOPHEN 325 MG PO TABS: 650.0000 mg | ORAL_TABLET | Freq: Four times a day (QID) | ORAL | Status: DC | PRN

## 2015-05-18 MED ORDER — IRBESARTAN 75 MG PO TABS
75.0000 mg | ORAL_TABLET | Freq: Every day | ORAL | Status: DC
Start: 2015-05-18 — End: 2015-05-20
  Administered 2015-05-18 – 2015-05-20 (×3): 75 mg via ORAL
  Filled 2015-05-18 (×3): qty 1

## 2015-05-18 MED ORDER — ATORVASTATIN CALCIUM 40 MG PO TABS
40.0000 mg | ORAL_TABLET | Freq: Every day | ORAL | Status: DC
  Administered 2015-05-18 – 2015-05-20 (×3): 40 mg via ORAL
  Filled 2015-05-18 (×3): qty 1

## 2015-05-18 MED ORDER — IPRATROPIUM-ALBUTEROL 0.5-2.5 (3) MG/3ML IN SOLN
3.0000 mL | Freq: Three times a day (TID) | RESPIRATORY_TRACT | Status: DC
  Administered 2015-05-19 (×3): 3 mL via RESPIRATORY_TRACT
  Filled 2015-05-18 (×3): qty 3

## 2015-05-18 MED ORDER — VITAMIN C 500 MG PO TABS
500.0000 mg | ORAL_TABLET | Freq: Every day | ORAL | Status: DC
  Administered 2015-05-18 – 2015-05-20 (×3): 500 mg via ORAL
  Filled 2015-05-18 (×3): qty 1

## 2015-05-18 MED ORDER — ALPRAZOLAM 0.25 MG PO TABS: 0.2500 mg | ORAL_TABLET | Freq: Two times a day (BID) | ORAL | Status: DC | PRN

## 2015-05-18 MED ORDER — FENOFIBRATE 160 MG PO TABS
160.0000 mg | ORAL_TABLET | Freq: Every day | ORAL | Status: DC
  Administered 2015-05-18 – 2015-05-20 (×3): 160 mg via ORAL
  Filled 2015-05-18 (×3): qty 1

## 2015-05-18 MED ORDER — ONDANSETRON HCL 4 MG PO TABS: 4.0000 mg | ORAL_TABLET | Freq: Four times a day (QID) | ORAL | Status: DC | PRN

## 2015-05-18 MED ORDER — ONDANSETRON HCL 4 MG/2ML IJ SOLN: 4.0000 mg | Freq: Four times a day (QID) | INTRAMUSCULAR | Status: DC | PRN

## 2015-05-18 MED ORDER — POTASSIUM CHLORIDE CRYS ER 20 MEQ PO TBCR
20.0000 meq | EXTENDED_RELEASE_TABLET | Freq: Every day | ORAL | Status: DC
  Administered 2015-05-18 – 2015-05-20 (×3): 20 meq via ORAL
  Filled 2015-05-18 (×3): qty 1

## 2015-05-18 MED ORDER — FERROUS SULFATE 325 (65 FE) MG PO TABS
325.0000 mg | ORAL_TABLET | Freq: Two times a day (BID) | ORAL | Status: DC
Start: 2015-05-18 — End: 2015-05-20
  Administered 2015-05-18 – 2015-05-20 (×5): 325 mg via ORAL
  Filled 2015-05-18 (×7): qty 1

## 2015-05-18 MED ORDER — LEVOFLOXACIN IN D5W 500 MG/100ML IV SOLN
500.0000 mg | INTRAVENOUS | Status: DC
  Administered 2015-05-19: 500 mg via INTRAVENOUS
  Filled 2015-05-18: qty 100

## 2015-05-18 MED ORDER — ENOXAPARIN SODIUM 40 MG/0.4ML ~~LOC~~ SOLN
40.0000 mg | SUBCUTANEOUS | Status: DC
  Administered 2015-05-18 – 2015-05-19 (×2): 40 mg via SUBCUTANEOUS
  Filled 2015-05-18 (×2): qty 0.4

## 2015-05-18 MED ORDER — FUROSEMIDE 40 MG PO TABS: 40.0000 mg | ORAL_TABLET | Freq: Every day | ORAL | Status: DC

## 2015-05-18 MED ORDER — PANTOPRAZOLE SODIUM 40 MG PO TBEC
40.0000 mg | DELAYED_RELEASE_TABLET | Freq: Every day | ORAL | Status: DC
  Administered 2015-05-18 – 2015-05-20 (×3): 40 mg via ORAL
  Filled 2015-05-18 (×3): qty 1

## 2015-05-18 MED ORDER — TEMAZEPAM 15 MG PO CAPS
30.0000 mg | ORAL_CAPSULE | Freq: Every evening | ORAL | Status: DC | PRN
  Administered 2015-05-18 – 2015-05-19 (×2): 30 mg via ORAL
  Filled 2015-05-18 (×2): qty 2

## 2015-05-18 MED ORDER — ALBUTEROL SULFATE (2.5 MG/3ML) 0.083% IN NEBU: 2.5000 mg | INHALATION_SOLUTION | RESPIRATORY_TRACT | Status: DC | PRN

## 2015-05-18 NOTE — ED Provider Notes (Signed)
CSN: [id number]     Arrival date & time 05/18/15  0011 History  By signing my name below, I, Hansel Feinstein, attest that this documentation has been prepared under the direction and in the presence of Merryl Hacker, MD. Electronically Signed: Hansel Feinstein, ED Scribe. 05/18/2015. 12:57 AM.    Chief Complaint  Patient presents with  . Shortness of Breath   The history is provided by the patient. No language interpreter was used.    HPI Comments: MARGUETTE CONDRY is a 75 y.o. male brought in by ambulance with h/o HTN, HLD, GERD, PVD, CAD, COPD gold III, cardiac cath, cardiac stent who presents to the Emergency Department complaining of moderate SOB onset this evening with associated cough, chest congestion. Pt notes similar symptoms last week and saw relief of symptoms after receiving an albuterol breathing tx by EMS. He states he was not transported to the hospital. Pt states he has seen some relief of his current symptoms with albuterol inhaler use at home and after 125 mg Solumedrol, Atrovent, albuterol en route. He wears O2 at night. No h/o heart failure. Pt is not taking diuretics. He denies fever, recent leg swelling, CP.    Past Medical History  Diagnosis Date  . Polyp of nasal cavity   . Rosacea   . Hx of adenomatous colonic polyps 2012, 2013.   . Tobacco abuse   . Hypertension   . Hyperlipidemia   . GERD (gastroesophageal reflux disease)   . GI bleed 2010    4 units PRBCs  . Peripheral vascular disease ([place])   . Irregular heartbeat   . Shingles   . Carotid artery occlusion   . Angiodysplasia of intestine with hemorrhage     large and SB, gastric AVMs.   . Pleural plaque with presence of asbestos 03/27/2013    Followed in Pulmonary clinic/ [facility]/ [place] - F/u CT 09/08/2013 1. Stable extensive calcified pleural plaque formation consistent with asbestos exposure. 2. Multiple pulmonary nodules are unchanged from the CT of 6 months ago. Given risk factors for lung cancer,  continued follow up is recommended with chest CT in 6 months> done 04/20/14 no change >repeat in 12 m in tickle file     . GERD 11/30/2008    Qualifier: Diagnosis of  By: Marijean Niemann CMA, Danielle    . PVD (peripheral vascular disease) ([place]) 10/18/2012  . Carotid artery stenosis 04/22/2012  . Coronary artery disease   . COPD GOLD III with min reversibilty  08/11/2006    Followed in Pulmonary clinic/ [facility]/ [place] - PFT's 04/28/2013  FEV1 0.88 (40%) with ratio 44 and 14% better p B2 dlco 45 corrects to 83 - Trial of breo 04/28/2013 > improved symptoms  06/09/2013  - spirometry 06/04/2014 FEV1  0.76 (29%) ratio 45      . Iron deficiency anemia 01/25/2009    Qualifier: Diagnosis of  By: Henrene Pastor MD, Docia Chuck   . History of blood transfusion "couple times"    "related to bleeding in colon and esophagus"  . Arthritis     "left shoulder" (10/19/2014)  . Anxiety   . 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 Iliac artery stent Left 2005    CIA  . Esophagogastroduodenoscopy  2012    normal  . Colonoscopy  July 2015    Dr. Henrene Pastor  .  Tonsillectomy    . Knee arthroscopy with medial menisectomy Left 03/08/2014    Procedure: LEFT KNEE SCOPE WITH MEDIAL MENISECTOMY AND CHONDROPLASTY;  Surgeon: Ninetta Lights, MD;  Location: [place];  Service: Orthopedics;  Laterality: Left;  . Left and right heart catheterization with coronary angiogram N/A 06/28/2014    Procedure: LEFT AND RIGHT HEART CATHETERIZATION WITH CORONARY ANGIOGRAM;  Surgeon: Jerline Pain, MD;  Location[facility] CATH LAB;  Service: Cardiovascular;  Laterality: N/A;  . Esophagogastroduodenoscopy N/A 08/17/2014    Procedure: ESOPHAGOGASTRODUODENOSCOPY (EGD);  Surgeon: Jerene Bears, MD;  Location: [facility] ENDOSCOPY;  Service: Endoscopy;  Laterality: N/A;  . Colonoscopy N/A 08/17/2014    Procedure: COLONOSCOPY;   Surgeon: Jerene Bears, MD;  Location: [facility] ENDOSCOPY;  Service: Endoscopy;  Laterality: N/A;  . Cardiac catheterization  2001; 06/28/2014  . Cardiac catheterization N/A 10/19/2014    Procedure: Coronary Stent Intervention;  Surgeon: Burnell Blanks, MD;  Location: [place] CV LAB;  Service: Cardiovascular;  Laterality: N/A;  BMS Mid RCA  . Transcatheter aortic valve replacement, transapical N/A 11/20/2014    Procedure: TRANSCATHETER AORTIC VALVE REPLACEMENT, TRANSAPICAL;  Surgeon: [name], MD;  Location: [place];  Service: Open Heart Surgery;  Laterality: N/A;  . Tee without cardioversion N/A 11/20/2014    Procedure: TRANSESOPHAGEAL ECHOCARDIOGRAM (TEE);  Surgeon: [name], MD;  Location: [place];  Service: Open Heart Surgery;  Laterality: N/A;  . Rib plating Left 11/20/2014    Procedure: RIB PLATING OF LEFT 8TH RIB;  Surgeon: [name], MD;  Location: [place];  Service: Open Heart Surgery;  Laterality: Left;  . Colonoscopy N/A 12/05/2014    Procedure: COLONOSCOPY;  Surgeon: Manus Gunning, MD;  Location: [place];  Service: Gastroenterology;  Laterality: N/A;  . Enteroscopy N/A 12/05/2014    Procedure: ENTEROSCOPY;  Surgeon: Manus Gunning, MD;  Location: [place];  Service: Gastroenterology;  Laterality: N/A;   Family History  Problem Relation Age of Onset  . Lung disease Mother     pulm fibrosis  . Colitis Father   . Colon cancer Neg Hx   . Heart disease Brother   . Hypertension Brother   . Hyperlipidemia Brother   . Heart disease Daughter     cad  . Hypertension Son    Social History  Substance Use Topics  . Smoking status: Former Smoker -- 1.00 packs/day for 60 years    Types: Cigarettes, E-cigarettes    Quit date: 04/18/2015  . Smokeless tobacco: Never Used     Comment: Patient is using the patch  . Alcohol Use: 8.4 oz/week    14 Glasses of wine per week    Review of Systems  Constitutional: Negative for fever.  HENT: Positive for  congestion.   Respiratory: Positive for cough and shortness of breath.   Cardiovascular: Negative for chest pain and leg swelling.  Gastrointestinal: Negative for nausea and vomiting.  All other systems reviewed and are negative.  Allergies  Review of patient's allergies indicates no known allergies.  Home Medications   Prior to Admission medications   Medication Sig Start Date End Date Taking? Authorizing Provider  albuterol (PROVENTIL HFA;VENTOLIN HFA) 108 (90 BASE) MCG/ACT inhaler Inhale 2 puffs into the lungs every 4 (four) hours as needed for wheezing or shortness of breath. 01/15/15  Yes Tanda Rockers, MD  ALPRAZolam Duanne Moron) 0.25 MG tablet Take 1 tablet (0.25 mg total) by mouth 2 (two) times daily as needed. for anxiety 05/09/15  Yes Rosalita Chessman, DO  Ascorbic Acid (VITAMIN C) 250 MG tablet Take 500 mg by mouth daily.    Yes Historical Provider, MD  aspirin 81 MG chewable tablet Chew 1 tablet (81 mg total) by mouth daily. 10/20/14  Yes Brett Canales, PA-C  atorvastatin (LIPITOR) 40 MG tablet TAKE ONE TABLET BY MOUTH ONCE DAILY 02/12/15  Yes Rosalita Chessman, DO  citalopram (CELEXA) 10 MG tablet Take 1 tablet (10 mg total) by mouth daily. 05/09/15  Yes Alferd Apa [place], DO  diclofenac sodium (VOLTAREN) 1 % GEL Apply 2 g topically 4 (four) times daily. 04/25/15  Yes Alferd Apa [place], DO  docusate sodium (COLACE) 100 MG capsule Take 100 mg by mouth 2 (two) times daily.    Yes Historical Provider, MD  fenofibrate 160 MG tablet Take 1 tablet (160 mg total) by mouth daily. 05/15/15  Yes Alferd Apa [place], DO  ferrous sulfate 325 (65 FE) MG EC tablet Take 1 tablet (325 mg total) by mouth 2 (two) times daily. 05/09/15  Yes Yvonne R [place], DO  furosemide (LASIX) 20 MG tablet Take 2 tablets (40 mg total) by mouth daily. 05/09/15  Yes Alferd Apa [place], DO  metoprolol tartrate (LOPRESSOR) 25 MG tablet Take 0.5 tablets (12.5 mg total) by mouth 2 (two) times daily. 05/09/15  Yes Rosalita Chessman, DO  Multiple  Vitamins-Minerals (CENTRUM SILVER PO) Take 1 tablet by mouth daily.    Yes Historical Provider, MD  pantoprazole (PROTONIX) 40 MG tablet Take 1 tablet (40 mg total) by mouth daily. 05/09/15  Yes Yvonne R [place], DO  potassium chloride SA (K-DUR,KLOR-CON) 20 MEQ tablet Take 1 tablet (20 mEq total) by mouth daily. 05/09/15  Yes Yvonne R [place], DO  temazepam (RESTORIL) 30 MG capsule Take 1 capsule (30 mg total) by mouth at bedtime as needed. for sleep 05/09/15  Yes Rosalita Chessman, DO  Umeclidinium-Vilanterol [facility] ELLIPTA) 62.5-25 MCG/INH AEPB 1 puff once daily every morning 01/18/15  Yes Tanda Rockers, MD  valsartan (DIOVAN) 80 MG tablet Take 1 tablet (80 mg total) by mouth daily. 05/09/15  Yes Yvonne R [place], DO   BP 125/52 mmHg  Pulse 74  Temp(Src) 97.7 F (36.5 C) (Rectal)  Resp 19  SpO2 99% Physical Exam  Constitutional: He is oriented to person, place, and time. No distress.  Elderly, chronically ill-appearing, no acute distress  HENT:  Head: Normocephalic and atraumatic.  Cardiovascular: Normal rate, regular rhythm and normal heart sounds.   No murmur heard. Pulmonary/Chest: Effort normal. No respiratory distress. He has wheezes.  Fair air movement, diffuse expiratory squeak, no significant tachypnea or distress noted  Abdominal: Soft. Bowel sounds are normal. There is no tenderness. There is no rebound.  Musculoskeletal: He exhibits no edema.  Lymphadenopathy:    He has no cervical adenopathy.  Neurological: He is alert and oriented to person, place, and time.  Skin: Skin is warm and dry.  Psychiatric: He has a normal mood and affect.  Nursing note and vitals reviewed.   ED Course  Procedures (including critical care time)  CRITICAL CARE Performed by: Merryl Hacker   Total critical care time: 25 minutes  Critical care time was exclusive of separately billable procedures and treating other patients.  Critical care was necessary to treat or prevent imminent or  life-threatening deterioration.  Critical care was time spent personally by me on the following activities: development of treatment plan with patient and/or surrogate as well as nursing, discussions with consultants,  evaluation of patient's response to treatment, examination of patient, obtaining history from patient or surrogate, ordering and performing treatments and interventions, ordering and review of laboratory studies, ordering and review of radiographic studies, pulse oximetry and re-evaluation of patient's condition.  DIAGNOSTIC STUDIES: Oxygen Saturation is 100% on [place] 2L, normal by my interpretation.    COORDINATION OF CARE: 12:39 AM Discussed treatment plan with pt at bedside and pt agreed to plan.  1:57 AM Got called to the room d/t hypotension. Pt afebrile on rectal temp. Pt is asymptomatic, will recheck.    Labs Review Labs Reviewed  CBC WITH DIFFERENTIAL/PLATELET - Abnormal; Notable for the following:    Monocytes Absolute 1.1 (*)    All other components within normal limits  BASIC METABOLIC PANEL - Abnormal; Notable for the following:    Glucose, Bld 136 (*)    BUN 22 (*)    All other components within normal limits  I-STAT CG4 LACTIC ACID, ED - Abnormal; Notable for the following:    Lactic Acid, Venous 2.25 (*)    All other components within normal limits  BRAIN NATRIURETIC PEPTIDE  I-STAT CG4 LACTIC ACID, ED  Randolm Idol, ED    Imaging Review Dg Chest Portable 1 View  05/18/2015  CLINICAL DATA:  Shortness of breath. History of hypertension, hyperlipidemia. EXAM: PORTABLE CHEST 1 VIEW COMPARISON:  CT chest April 09, 2015 FINDINGS: Cardiomediastinal silhouette is normal, mildly calcified aortic knob. Aortic root graft. Scattered granulomata, and calcified pleural plaques better seen on prior CT. LEFT midlung zone scarring without pleural effusion or focal consolidation. No pneumothorax. Old LEFT anterior rib ORIF. IMPRESSION: No acute cardiopulmonary process.  Scattered granulomata and calcified pleural plaques better seen on prior CT. Electronically Signed   By: Elon Alas M.D.   On: 05/18/2015 01:39   I have personally reviewed and evaluated these images and lab results as part of my medical decision-making.   EKG Interpretation   Date/Time:  Saturday May 18 2015 00:28:02 EST Ventricular Rate:  84 PR Interval:  144 QRS Duration: 85 QT Interval:  346 QTC Calculation: 409 R Axis:   69 Text Interpretation:  Sinus rhythm Anterior infarct, old Borderline T  abnormalities, inferior leads No significant change since last tracing  Confirmed by HORTON  MD, COURTNEY ([id number]) on 05/18/2015 2:07:14 AM      MDM   Final diagnoses:  COPD exacerbation ([place])    Patient presents with shortness of breath. Some improvement noted with duo nebs en route. He continues to wheeze. He is in no respiratory distress. Vital signs are reassuring. He is afebrile. Patient was given a DuoNeb. He received steroids. Given response to nebs, suspect COPD exacerbation. EKG is nonischemic. Basic labwork obtained and largely reassuring. Chest x-ray is without evidence of pneumonia.  At one point, I was informed by nursing that the patient had low blood pressure. He was given 500 mL of fluids. Repeat blood pressures to confirm with a new cuff 108/52.  Rectal temperature and lactate added to ensure no signs of evolving sepsis. Lactate normal. Rectal temperature normal.  After 2 DuoNeb's, patient continues to have some slight wheezing. He was ambulated in the hallway. Patient reported increasing shortness of breath with ambulation. He has home oxygen at home but given the degree of shortness of breath with ambulation, will admit for frequent nebs and monitoring. Patient was given Levaquin for presumed COPD exacerbation.  Patient was placed on a continuous nebulizer.  I personally performed the services described in this documentation,  which was scribed in my presence. The  recorded information has been reviewed and is accurate.   Merryl Hacker, MD 05/18/15 [phone]

## 2015-05-18 NOTE — ED Notes (Signed)
Pt's BP reading low 89/25, BP cuff changed to small cuff and BP now 108/52. Dr Dina Rich in room and order rectal temp and 55ml bolus.

## 2015-05-18 NOTE — Evaluation (Signed)
Physical Therapy Evaluation Patient Details Name: Ronald Bell MRN: [id number] DOB: 18-Feb-1941 Today's Date: 05/18/2015   History of Present Illness  75 y.o. male brought in by ambulance with h/o HTN, HLD, GERD, PVD, CAD, TAVR (8/16), COPD gold III, cardiac stent who presents to the Emergency Department complaining of moderate SOB onset this evening with associated cough, chest congestion  Clinical Impression  Pt admitted with above diagnosis. Pt currently with functional limitations due to the deficits listed below (see PT Problem List).  Pt will benefit from skilled PT to increase their independence and safety with mobility to allow discharge to the venue listed below.  Pt moving well overall with use of IV pole and o2 sat 90-92% during gait on room air with it dropping to 89% after toileting.  Anticipate good progress and no follow up needed. Pt educated on proper breathing techniques.     Follow Up Recommendations No PT follow up    Equipment Recommendations  None recommended by PT    Recommendations for Other Services       Precautions / Restrictions Precautions Precaution Comments: monitor o2      Mobility  Bed Mobility               General bed mobility comments: up in recliner upon arrival  Transfers Overall transfer level: Modified independent   Transfers: Sit to/from Stand Sit to Stand: Modified independent (Device/Increase time)            Ambulation/Gait Ambulation/Gait assistance: Min guard;Supervision Ambulation Distance (Feet): 140 Feet Assistive device:  (IV pole) Gait Pattern/deviations: Step-through pattern     General Gait Details: Pt ambulated on room air and O2 between 90-92% with 2/4 dyspnea.  Upon return to the room, pt used the restroom and after returning to chair o2 89% with increased SOB. Returned to 90s within 30 seconds.  Stairs            Wheelchair Mobility    Modified Rankin (Stroke Patients Only)        Balance Overall balance assessment: No apparent balance deficits (not formally assessed)                                           Pertinent Vitals/Pain Pain Assessment: No/denies pain    Home Living Family/patient expects to be discharged to:: Private residence Living Arrangements: Spouse/significant other Available Help at Discharge: Family;Available 24 hours/day Type of Home: House Home Access: Stairs to enter   [employer] of Steps: 1 Home Layout: One level Home Equipment: Cane - single point;Walker - 4 wheels Additional Comments: oxygen at night    Prior Function Level of Independence: Independent               Hand Dominance        Extremity/Trunk Assessment   Upper Extremity Assessment: Overall WFL for tasks assessed           Lower Extremity Assessment: Overall WFL for tasks assessed      Cervical / Trunk Assessment: Normal  Communication   Communication: No difficulties  Cognition Arousal/Alertness: Awake/alert Behavior During Therapy: WFL for tasks assessed/performed Overall Cognitive Status: Within Functional Limits for tasks assessed                      General Comments General comments (skin integrity, edema, etc.): cues for proper breathing technique given  throughout session    Exercises        Assessment/Plan    PT Assessment Patient needs continued PT services  PT Diagnosis Difficulty walking   PT Problem List Decreased activity tolerance;Decreased balance;Decreased mobility;Cardiopulmonary status limiting activity  PT Treatment Interventions Gait training;Functional mobility training;Therapeutic activities;Therapeutic exercise;Balance training;Patient/family education   PT Goals (Current goals can be found in the Care Plan section) Acute Rehab PT Goals Patient Stated Goal: go home PT Goal Formulation: With patient Time For Goal Achievement: 05/25/15 Potential to Achieve Goals: Good     Frequency Min 2X/week   Barriers to discharge        Co-evaluation               End of Session Equipment Utilized During Treatment: Gait belt Activity Tolerance: Patient tolerated treatment well Patient left: in chair;with call bell/phone within reach;Other (comment) (lab tech) Nurse Communication: Mobility status         Time: [id number] PT Time Calculation (min) (ACUTE ONLY): 14 min   Charges:   PT Evaluation $PT Eval Low Complexity: 1 Procedure     PT G Codes:        Treyvonne Tata LUBECK 05/18/2015, 3:47 PM

## 2015-05-18 NOTE — Progress Notes (Signed)
Initial Nutrition Assessment  DOCUMENTATION CODES:   Not applicable  INTERVENTION:    Continue Heart Healthy diet  NUTRITION DIAGNOSIS:   Increased nutrient needs related to chronic illness as evidenced by estimated needs  GOAL:   Patient will meet greater than or equal to 90% of their needs  MONITOR:   PO intake, Supplement acceptance, Labs, Weight trends, I & O's  REASON FOR ASSESSMENT:   Consult COPD Protocol  ASSESSMENT:   75 y.o. Male brought in by ambulance with h/o HTN, HLD, GERD, PVD, CAD, COPD gold III, cardiac stent who presents to the Emergency Department complaining of moderate SOB onset this evening with associated cough, chest congestion Patient is on chronic nocturnal O2 at home on 2 L.. Chest x-ray negative. Lactate within normal limits. No relief after 2 day on treatments. Patient being admitted for COPD exacerbation.  Patient reports his appetite is "so-so". PO intake 90% per flowsheet records. Does not drink any oral nutrition supplements at home.  Declined during hospitalization. Weight has been stable.  Nutrition focused physical exam completed.  No muscle or subcutaneous fat depletion noticed.  Diet Order:  Diet Heart Room service appropriate?: Yes; Fluid consistency:: Thin  Skin:  Reviewed, no issues  Last BM:  N/A  Height:   Ht Readings from Last 1 Encounters:  05/18/15 5\' 2"  (1.575 m)    Weight:   Wt Readings from Last 1 Encounters:  05/18/15 140 lb (63.504 kg)    Wt Readings from Last 10 Encounters:  05/18/15 140 lb (63.504 kg)  05/09/15 140 lb 6.4 oz (63.685 kg)  02/22/15 138 lb (62.596 kg)  02/18/15 137 lb 6.4 oz (62.324 kg)  01/10/15 136 lb (61.689 kg)  12/24/14 138 lb (62.596 kg)  12/24/14 138 lb (62.596 kg)  12/17/14 136 lb 6.4 oz (61.871 kg)  12/09/14 133 lb 2.5 oz (60.4 kg)  11/30/14 145 lb 12.8 oz (66.134 kg)    Ideal Body Weight:  50 kg  BMI:  Body mass index is 25.6 kg/(m^2).  Estimated Nutritional  Needs:   Kcal:  1700-1900  Protein:  80-90 gm  Fluid:  1.7-1.9 L  EDUCATION NEEDS:   No education needs identified at this time  Arthur Holms, RD, LDN Pager #: [phone] After-Hours Pager #: [phone]

## 2015-05-18 NOTE — Progress Notes (Signed)
Hiram Gash RN [phone] today.

## 2015-05-18 NOTE — ED Notes (Signed)
Per EMS, pt from home with complaint of URI x  1 week. Pt has been using mucinex and albuterol inhaler. Pt woke up this evening and distress and called 911. EMS gave 1 of atrovent, 10mg  albuterol, and 125 solumedrol in route. Upon arrival pt able to speak in full sentences. Pt has hx of copd and wears 2L [place] o2 at night and same for activity during the day. Pt alert and oriented x 4.

## 2015-05-18 NOTE — H&P (Signed)
[facility] History and Physical  Ronald Bell [id number] DOB: 1941-02-07 DOA: 05/18/2015  Referring physician: Emergency Department PCP: [name], DO   CHIEF COMPLAINT:   Shortness of breath                HPI: Ronald Bell is a 75 y.o. male  with multiple, significant medical problems not limited to hypertension, peripheral vascular disease , coronary artery disease s/p stent placement , severe aortic stenosis status post TAVR, and COPD gold III. He presented to the emergency department with dyspnea.   Patient is on chronic O2 at home. Yesterday he became short of breath and noticed some wheezing. Patient has been using his inhalers at home though only using the albuterol once a day instead of 4 times a day as needed. Patient has a chronic cough productive of sputum. He does not know what color the sputum is. It does not sound like the sputum has increased in volume. Patient sleeps on pillows, he has not been requiring any more than normal to get comfortable. No fevers or chills     ED COURSE:   Sats in the low nineties on nasal cannula. Intermittently tachycardic with heart rate less than 105. BP stable. Intermittent tachypnea.      Labs:   Troponin 0.01 Lactic acid 2.25, up from 0.99  EKG:     Sinus rhythm Anterior infarct, old Borderline T abnormalities, inferior leads No significant change since last tracing                  Medications  ipratropium-albuterol (DUONEB) 0.5-2.5 (3) MG/3ML nebulizer solution 3 mL (3 mLs Nebulization Given 05/18/15 0117)  sodium chloride 0.9 % bolus 500 mL (0 mLs Intravenous Stopped 05/18/15 0245)  ipratropium-albuterol (DUONEB) 0.5-2.5 (3) MG/3ML nebulizer solution 3 mL (3 mLs Nebulization Given 05/18/15 0245)  levofloxacin (LEVAQUIN) IVPB 500 mg (500 mg Intravenous Transfusing/Transfer 05/18/15 0631)  albuterol (PROVENTIL,VENTOLIN) solution continuous neb (10 mg/hr Nebulization Given 05/18/15 0454)    Review of Systems   Constitutional: Negative.   HENT: Negative.   Eyes: Negative.   Respiratory: Positive for cough, sputum production, shortness of breath and wheezing.   Cardiovascular: Negative.   Gastrointestinal: Negative.   Genitourinary: Negative.   Musculoskeletal: Negative.   Skin: Negative.   Neurological: Negative.   Endo/Heme/Allergies: Negative.   Psychiatric/Behavioral: Negative.     Past Medical History  Diagnosis Date  . Polyp of nasal cavity   . Rosacea   . Hx of adenomatous colonic polyps 2012, 2013.   . Tobacco abuse   . Hypertension   . Hyperlipidemia   . GERD (gastroesophageal reflux disease)   . GI bleed 2010    4 units PRBCs  . Peripheral vascular disease ([place])   . Irregular heartbeat   . Shingles   . Carotid artery occlusion   . Angiodysplasia of intestine with hemorrhage     large and SB, gastric AVMs.   . Pleural plaque with presence of asbestos 03/27/2013    Followed in Pulmonary clinic/ [facility]/ [place] - F/u CT 09/08/2013 1. Stable extensive calcified pleural plaque formation consistent with asbestos exposure. 2. Multiple pulmonary nodules are unchanged from the CT of 6 months ago. Given risk factors for lung cancer, continued follow up is recommended with chest CT in 6 months> done 04/20/14 no change >repeat in 12 m in tickle file     . GERD 11/30/2008    Qualifier: Diagnosis of  By: Marijean Niemann CMA, Danielle    .  PVD (peripheral vascular disease) ([place]) 10/18/2012  . Carotid artery stenosis 04/22/2012  . Coronary artery disease   . COPD GOLD III with min reversibilty  08/11/2006    Followed in Pulmonary clinic/ [facility]/ [place] - PFT's 04/28/2013  FEV1 0.88 (40%) with ratio 44 and 14% better p B2 dlco 45 corrects to 83 - Trial of breo 04/28/2013 > improved symptoms  06/09/2013  - spirometry 06/04/2014 FEV1  0.76 (29%) ratio 45      . Iron deficiency anemia 01/25/2009    Qualifier: Diagnosis of  By: Henrene Pastor MD, Docia Chuck   . History of blood transfusion "couple times"     "related to bleeding in colon and esophagus"  . Arthritis     "left shoulder" (10/19/2014)  . Anxiety   . 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 Iliac artery stent Left 2005    CIA  . Esophagogastroduodenoscopy  2012    normal  . Colonoscopy  July 2015    Dr. Henrene Pastor  . Tonsillectomy    . Knee arthroscopy with medial menisectomy Left 03/08/2014    Procedure: LEFT KNEE SCOPE WITH MEDIAL MENISECTOMY AND CHONDROPLASTY;  Surgeon: Ninetta Lights, MD;  Location: [facility];  Service: Orthopedics;  Laterality: Left;  . Left and right heart catheterization with coronary angiogram N/A 06/28/2014    Procedure: LEFT AND RIGHT HEART CATHETERIZATION WITH CORONARY ANGIOGRAM;  Surgeon: Jerline Pain, MD;  Location: [facility] CATH LAB;  Service: Cardiovascular;  Laterality: N/A;  . Esophagogastroduodenoscopy N/A 08/17/2014    Procedure: ESOPHAGOGASTRODUODENOSCOPY (EGD);  Surgeon: Jerene Bears, MD;  Location: [facility] ENDOSCOPY;  Service: Endoscopy;  Laterality: N/A;  . Colonoscopy N/A 08/17/2014    Procedure: COLONOSCOPY;  Surgeon: Jerene Bears, MD;  Location: [facility] ENDOSCOPY;  Service: Endoscopy;  Laterality: N/A;  . Cardiac catheterization  2001; 06/28/2014  . Cardiac catheterization N/A 10/19/2014    Procedure: Coronary Stent Intervention;  Surgeon: Burnell Blanks, MD;  Location: [place] CV LAB;  Service: Cardiovascular;  Laterality: N/A;  BMS Mid RCA  . Transcatheter aortic valve replacement, transapical N/A 11/20/2014    Procedure: TRANSCATHETER AORTIC VALVE REPLACEMENT, TRANSAPICAL;  Surgeon: [name], MD;  Location: [place];  Service: Open Heart Surgery;  Laterality: N/A;  . Tee without cardioversion N/A 11/20/2014    Procedure: TRANSESOPHAGEAL ECHOCARDIOGRAM (TEE);  Surgeon: [name], MD;  Location: [place];  Service: Open Heart Surgery;   Laterality: N/A;  . Rib plating Left 11/20/2014    Procedure: RIB PLATING OF LEFT 8TH RIB;  Surgeon: [name], MD;  Location: [place];  Service: Open Heart Surgery;  Laterality: Left;  . Colonoscopy N/A 12/05/2014    Procedure: COLONOSCOPY;  Surgeon: Manus Gunning, MD;  Location: [place];  Service: Gastroenterology;  Laterality: N/A;  . Enteroscopy N/A 12/05/2014    Procedure: ENTEROSCOPY;  Surgeon: Manus Gunning, MD;  Location: [place];  Service: Gastroenterology;  Laterality: N/A;    SOCIAL HISTORY:  reports that he quit smoking about 4 weeks ago. His smoking use included Cigarettes and E-cigarettes. He has a 60 pack-year smoking history. He has never used smokeless tobacco. He reports that he drinks about 8.4 oz of alcohol per week. He reports that he does not use illicit drugs. Lives:   At home with wife   Assistive devices:  None needed for ambulation.   No Known Allergies  Family History  Problem Relation Age of Onset  . Lung disease Mother     pulm fibrosis  . Colitis Father   . Colon cancer Neg Hx   . Heart disease Brother   . Hypertension Brother   . Hyperlipidemia Brother   . Heart disease Daughter     cad  . Hypertension Son     Prior to Admission medications   Medication Sig Start Date End Date Taking? Authorizing Provider  albuterol (PROVENTIL HFA;VENTOLIN HFA) 108 (90 BASE) MCG/ACT inhaler Inhale 2 puffs into the lungs every 4 (four) hours as needed for wheezing or shortness of breath. 01/15/15  Yes Tanda Rockers, MD  ALPRAZolam Duanne Moron) 0.25 MG tablet Take 1 tablet (0.25 mg total) by mouth 2 (two) times daily as needed. for anxiety 05/09/15  Yes Rosalita Chessman, DO  Ascorbic Acid (VITAMIN C) 250 MG tablet Take 500 mg by mouth daily.    Yes Historical Provider, MD  aspirin 81 MG chewable tablet Chew 1 tablet (81 mg total) by mouth daily. 10/20/14  Yes Brett Canales, PA-C  atorvastatin (LIPITOR) 40 MG tablet TAKE ONE TABLET BY MOUTH ONCE  DAILY 02/12/15  Yes Rosalita Chessman, DO  citalopram (CELEXA) 10 MG tablet Take 1 tablet (10 mg total) by mouth daily. 05/09/15  Yes Alferd Apa Lowne, DO  diclofenac sodium (VOLTAREN) 1 % GEL Apply 2 g topically 4 (four) times daily. 04/25/15  Yes Alferd Apa Lowne, DO  docusate sodium (COLACE) 100 MG capsule Take 100 mg by mouth 2 (two) times daily.    Yes Historical Provider, MD  fenofibrate 160 MG tablet Take 1 tablet (160 mg total) by mouth daily. 05/15/15  Yes Alferd Apa Lowne, DO  ferrous sulfate 325 (65 FE) MG EC tablet Take 1 tablet (325 mg total) by mouth 2 (two) times daily. 05/09/15  Yes Yvonne R Lowne, DO  furosemide (LASIX) 20 MG tablet Take 2 tablets (40 mg total) by mouth daily. 05/09/15  Yes Alferd Apa Lowne, DO  metoprolol tartrate (LOPRESSOR) 25 MG tablet Take 0.5 tablets (12.5 mg total) by mouth 2 (two) times daily. 05/09/15  Yes Rosalita Chessman, DO  Multiple Vitamins-Minerals (CENTRUM SILVER PO) Take 1 tablet by mouth daily.    Yes Historical Provider, MD  pantoprazole (PROTONIX) 40 MG tablet Take 1 tablet (40 mg total) by mouth daily. 05/09/15  Yes Yvonne R Lowne, DO  potassium chloride SA (K-DUR,KLOR-CON) 20 MEQ tablet Take 1 tablet (20 mEq total) by mouth daily. 05/09/15  Yes Yvonne R Lowne, DO  temazepam (RESTORIL) 30 MG capsule Take 1 capsule (30 mg total) by mouth at bedtime as needed. for sleep 05/09/15  Yes Rosalita Chessman, DO  Umeclidinium-Vilanterol [facility] ELLIPTA) 62.5-25 MCG/INH AEPB 1 puff once daily every morning 01/18/15  Yes Tanda Rockers, MD  valsartan (DIOVAN) 80 MG tablet Take 1 tablet (80 mg total) by mouth daily. 05/09/15  Yes Rosalita Chessman, DO   PHYSICAL EXAM: Filed Vitals:   05/18/15 0515 05/18/15 0530 05/18/15 0615 05/18/15 0646  BP: 111/98 113/77 117/55 116/45  Pulse: 80 81 104 100  Temp:    97.5 F (36.4 C)  TempSrc:    Oral  Resp: 24  18 18   SpO2: 100% 99% 95% 96%    Wt Readings from Last 3 Encounters:  05/09/15 63.685 kg (140 lb 6.4 oz)  02/22/15 62.596 kg (138  lb)  02/18/15 62.324 kg (137  lb 6.4 oz)    General:  Pleasant white male. Appears calm and comfortable Eyes: PER, normal lids, irises & conjunctiva ENT: grossly normal hearing, lips & tongue Neck: no LAD, no masses Cardiovascular: RRR.  No LE edema.  Respiratory: Respirations even and unlabored. Slightly labored respirations. Lungs CTA bilaterally, no wheezes / rales .   Abdomen: soft, non-distended, non-tender, active bowel sounds. No obvious masses.  Skin: no rash seen on limited exam Musculoskeletal: grossly normal tone BUE/BLE Psychiatric: grossly normal mood and affect, speech fluent and appropriate Neurologic: grossly non-focal.         LABS ON ADMISSION:    Basic Metabolic Panel:  Recent Labs Lab 05/18/15 0033  NA 141  K 4.4  CL 101  CO2 26  GLUCOSE 136*  BUN 22*  CREATININE 1.09  CALCIUM 10.3   CBC:  Recent Labs Lab 05/18/15 0033  WBC 9.4  NEUTROABS 6.0  HGB 14.8  HCT 44.8  MCV 98.0  PLT 191    BNP (last 3 results)  Recent Labs  05/31/14 2351 12/03/14 1919 05/18/15 0426  BNP 75.7 59.7 50.4    CREATININE: 1.09 (05/18/15 0033) Estimated creatinine clearance - 45.9 mL/min  Radiological Exams on Admission: Dg Chest Portable 1 View  05/18/2015  CLINICAL DATA:  Shortness of breath. History of hypertension, hyperlipidemia. EXAM: PORTABLE CHEST 1 VIEW COMPARISON:  CT chest April 09, 2015 FINDINGS: Cardiomediastinal silhouette is normal, mildly calcified aortic knob. Aortic root graft. Scattered granulomata, and calcified pleural plaques better seen on prior CT. LEFT midlung zone scarring without pleural effusion or focal consolidation. No pneumothorax. Old LEFT anterior rib ORIF. IMPRESSION: No acute cardiopulmonary process. Scattered granulomata and calcified pleural plaques better seen on prior CT. Electronically Signed   By: Elon Alas M.D.   On: 05/18/2015 01:39     ASSESSMENT / PLAN   Active Problems:   Hyperlipidemia   Essential  hypertension   GERD   COPD exacerbation (HCC)   Aortic stenosis, severe   S/P TAVR (transcatheter aortic valve replacement)   CAD (coronary artery disease)   Depression  Acute on chronic respiratory failure (increased work of breathing) secondary to COPD exacerbation . CXR without any acute findings or suggestion of heart failure. -Admit to Medical bed -Continue 02 [place] to maintain sats 88-92% -Scheduled Duonebs Q4 hours with Albuterol q2 prn -Continue IV levaquin initiated in ED  -Solumedrol 40mg  IV Q 12.   Elevated lactic acid. Patient is afebrile with a normal white count. He looks quite well. Doubt sepsis at this point. Patient does have a history of diastolic heart failure, grade 1 based on echocardiogram October 2016. He's received 500 mL bolus since admission. Does not appear dehydrated on exam so we'll hold off on maintenance IV fluids as to not overload him -monitor lactic acid levels -blood cultures  CAD, severe. Not CABG candidate. Status post stent placement 2016.  -continue home asa  History of severe AS, s/p TAVR August 2016.   PAD, s/p stenting of iliac artery   Hx of recurrent GI bleeding on Plavix  Iron deficiency anemia . Secondary to gastrointestinal AVMs and polyps. Followed by [facility]. Hgb normal. -continue home iron  Hypertension. Controlled.  -continue home lasix in am  Hyperlipidemia -continue home statin + fenofibrate  Depresssion. Stable.  -Continue home celexa  Former smoker. Quit smoking a month ago.    CONSULTANTS:    none  Code Status:  full code DVT Prophylaxis: Lovenox  Family Communication:  Patient alert, oriented  and understands plan of care.  Disposition Plan: Discharge to home in 24-48 hours   Time spent: 60 minutes Tye Savoy  NP [facility] Pager [phone]

## 2015-05-19 DIAGNOSIS — J441 Chronic obstructive pulmonary disease with (acute) exacerbation: Principal | ICD-10-CM

## 2015-05-19 DIAGNOSIS — J9621 Acute and chronic respiratory failure with hypoxia: Secondary | ICD-10-CM

## 2015-05-19 DIAGNOSIS — I350 Nonrheumatic aortic (valve) stenosis: Secondary | ICD-10-CM

## 2015-05-19 DIAGNOSIS — F329 Major depressive disorder, single episode, unspecified: Secondary | ICD-10-CM

## 2015-05-19 LAB — COMPREHENSIVE METABOLIC PANEL
ALT: 38 U/L (ref 17–63)
ANION GAP: 7 (ref 5–15)
AST: 27 U/L (ref 15–41)
Albumin: 3.2 g/dL — ABNORMAL LOW (ref 3.5–5.0)
Alkaline Phosphatase: 51 U/L (ref 38–126)
BUN: 16 mg/dL (ref 6–20)
CHLORIDE: 109 mmol/L (ref 101–111)
CO2: 27 mmol/L (ref 22–32)
CREATININE: 0.93 mg/dL (ref 0.61–1.24)
Calcium: 9.4 mg/dL (ref 8.9–10.3)
Glucose, Bld: 142 mg/dL — ABNORMAL HIGH (ref 65–99)
Potassium: 4.8 mmol/L (ref 3.5–5.1)
Sodium: 143 mmol/L (ref 135–145)
Total Bilirubin: 0.4 mg/dL (ref 0.3–1.2)
Total Protein: 6.3 g/dL — ABNORMAL LOW (ref 6.5–8.1)

## 2015-05-19 LAB — INFLUENZA PANEL BY PCR (TYPE A & B)
H1N1FLUPCR: NOT DETECTED
INFLAPCR: NEGATIVE
INFLBPCR: NEGATIVE

## 2015-05-19 MED ORDER — PREDNISONE 20 MG PO TABS
40.0000 mg | ORAL_TABLET | Freq: Every day | ORAL | Status: DC
  Administered 2015-05-20: 40 mg via ORAL
  Filled 2015-05-19: qty 2

## 2015-05-19 MED ORDER — METHYLPREDNISOLONE SODIUM SUCC 40 MG IJ SOLR
40.0000 mg | Freq: Two times a day (BID) | INTRAMUSCULAR | Status: AC
  Administered 2015-05-19: 40 mg via INTRAVENOUS
  Filled 2015-05-19: qty 1

## 2015-05-19 MED ORDER — LEVOFLOXACIN 500 MG PO TABS
500.0000 mg | ORAL_TABLET | Freq: Every day | ORAL | Status: DC
  Administered 2015-05-20: 500 mg via ORAL
  Filled 2015-05-19: qty 1

## 2015-05-19 MED ORDER — IPRATROPIUM-ALBUTEROL 0.5-2.5 (3) MG/3ML IN SOLN
3.0000 mL | Freq: Two times a day (BID) | RESPIRATORY_TRACT | Status: DC
  Administered 2015-05-20: 3 mL via RESPIRATORY_TRACT
  Filled 2015-05-19: qty 3

## 2015-05-19 NOTE — Progress Notes (Signed)
[facility] PROGRESS NOTE  Ronald Bell [id number] DOB: [birth year] DOA: 05/18/2015 PCP: [name], DO  Assessment/Plan: Acute on chronic respiratory failure -due to COPD exacerbation . CXR without any acute findings  -continue solumedrol, transition to PO prednisone -Scheduled Duonebs Q4 hours with Albuterol q2 prn -change levaquin to PO -FLu PCR negative  Elevated lactic acid.  -clinically no evidence of sepsis  CAD,  -severe. Not CABG candidate. Status post PCI/stent placement 2016.  -continue ASA, lopressor/statin  History of severe AS, s/p TAVR August 2016.   PAD, s/p stenting of iliac artery   Hx of recurrent GI bleeding on Plavix  Iron deficiency anemia . Secondary to gastrointestinal AVMs and polyps.  -Followed by [facility]. Hgb normal. -continue home iron  Hypertension. Controlled.  -continue home lasix in am  Hyperlipidemia -continue home statin + fenofibrate  Depresssion. Stable.  -Continue home celexa  Former smoker. Quit smoking a month ago.    CONSULTANTS:  none  Code Status: full code DVT Prophylaxis: Lovenox  Family Communication: Patient alert, oriented and understands plan of care.  Disposition Plan: home tomorrow  HPI/Subjective: Breathing better  Objective: Filed Vitals:   05/19/15 0439 05/19/15 1326  BP: 130/47 145/54  Pulse: 63 62  Temp: 97.4 F (36.3 C) 97.8 F (36.6 C)  Resp: 19 17    Intake/Output Summary (Last 24 hours) at 05/19/15 1347 Last data filed at 05/19/15 0949  Gross per 24 hour  Intake   1102 ml  Output    275 ml  Net    827 ml   Filed Weights   05/18/15 1227  Weight: 63.504 kg (140 lb)    Exam:   General:  AAOx3  Cardiovascular: S1S2/RRR  Respiratory: improved air movement  Abdomen: soft, NT, BS present  Musculoskeletal: no edema   Data Reviewed: Basic Metabolic Panel:  Recent Labs Lab 05/18/15 0033 05/19/15 0610  NA 141 143  K 4.4 4.8  CL 101 109  CO2 26  27  GLUCOSE 136* 142*  BUN 22* 16  CREATININE 1.09 0.93  CALCIUM 10.3 9.4   Liver Function Tests:  Recent Labs Lab 05/19/15 0610  AST 27  ALT 38  ALKPHOS 51  BILITOT 0.4  PROT 6.3*  ALBUMIN 3.2*   No results for input(s): LIPASE, AMYLASE in the last 168 hours. No results for input(s): AMMONIA in the last 168 hours. CBC:  Recent Labs Lab 05/18/15 0033  WBC 9.4  NEUTROABS 6.0  HGB 14.8  HCT 44.8  MCV 98.0  PLT 191   Cardiac Enzymes:  Recent Labs Lab 05/18/15 1015 05/18/15 1531 05/18/15 2129  TROPONINI <0.03 0.03 0.04*   BNP (last 3 results)  Recent Labs  05/31/14 2351 12/03/14 1919 05/18/15 0426  BNP 75.7 59.7 50.4    ProBNP (last 3 results) No results for input(s): PROBNP in the last 8760 hours.  CBG: No results for input(s): GLUCAP in the last 168 hours.  Recent Results (from the past 240 hour(s))  Culture, blood (Routine X 2) w Reflex to ID Panel     Status: None (Preliminary result)   Collection Time: 05/18/15 10:25 AM  Result Value Ref Range Status   Specimen Description BLOOD LEFT ANTECUBITAL  Final   Special Requests BOTTLES DRAWN AEROBIC AND ANAEROBIC 5CC  Final   Culture NO GROWTH 1 DAY  Final   Report Status PENDING  Incomplete  Culture, blood (Routine X 2) w Reflex to ID Panel     Status: None (Preliminary result)  Collection Time: 05/18/15 10:35 AM  Result Value Ref Range Status   Specimen Description BLOOD LEFT ANTECUBITAL  Final   Special Requests BOTTLES DRAWN AEROBIC AND ANAEROBIC 10CC  Final   Culture NO GROWTH 1 DAY  Final   Report Status PENDING  Incomplete     Studies: Dg Chest Portable 1 View  05/18/2015  CLINICAL DATA:  Shortness of breath. History of hypertension, hyperlipidemia. EXAM: PORTABLE CHEST 1 VIEW COMPARISON:  CT chest April 09, 2015 FINDINGS: Cardiomediastinal silhouette is normal, mildly calcified aortic knob. Aortic root graft. Scattered granulomata, and calcified pleural plaques better seen on prior CT.  LEFT midlung zone scarring without pleural effusion or focal consolidation. No pneumothorax. Old LEFT anterior rib ORIF. IMPRESSION: No acute cardiopulmonary process. Scattered granulomata and calcified pleural plaques better seen on prior CT. Electronically Signed   By: Elon Alas M.D.   On: 05/18/2015 01:39    Scheduled Meds: . aspirin  81 mg Oral Daily  . atorvastatin  40 mg Oral Daily  . citalopram  10 mg Oral Daily  . docusate sodium  100 mg Oral BID  . enoxaparin (LOVENOX) injection  40 mg Subcutaneous Q24H  . fenofibrate  160 mg Oral Daily  . ferrous sulfate  325 mg Oral BID  . ipratropium-albuterol  3 mL Nebulization TID  . irbesartan  75 mg Oral Daily  . levofloxacin (LEVAQUIN) IV  500 mg Intravenous Q24H  . methylPREDNISolone (SOLU-MEDROL) injection  40 mg Intravenous Q12H  . metoprolol tartrate  12.5 mg Oral BID  . pantoprazole  40 mg Oral Daily  . potassium chloride SA  20 mEq Oral Daily  . vitamin C  500 mg Oral Daily   Continuous Infusions:  Antibiotics Given (last 72 hours)    Date/Time Action Medication Dose Rate   05/19/15 0626 Given   levofloxacin (LEVAQUIN) IVPB 500 mg 500 mg 100 mL/hr      Active Problems:   Hyperlipidemia   Iron deficiency anemia   Essential hypertension   GERD   COPD exacerbation (HCC)   Aortic stenosis, severe   Former smoker   S/P TAVR (transcatheter aortic valve replacement)   CAD (coronary artery disease)   Depression   Acute on chronic respiratory failure (HCC)   Elevated lactic acid level    Time spent: 14min    [facility] Pager [phone]. If 7PM-7AM, please contact night-coverage at [email or web address], password [email or web address] 05/19/2015, 1:47 PM  LOS: 1 day

## 2015-05-20 LAB — CBC
HCT: 37.7 % — ABNORMAL LOW (ref 39.0–52.0)
Hemoglobin: 12.8 g/dL — ABNORMAL LOW (ref 13.0–17.0)
MCH: 33.2 pg (ref 26.0–34.0)
MCHC: 34 g/dL (ref 30.0–36.0)
MCV: 97.7 fL (ref 78.0–100.0)
PLATELETS: 163 10*3/uL (ref 150–400)
RBC: 3.86 MIL/uL — ABNORMAL LOW (ref 4.22–5.81)
RDW: 13.6 % (ref 11.5–15.5)
WBC: 10.5 10*3/uL (ref 4.0–10.5)

## 2015-05-20 LAB — LACTIC ACID, PLASMA: LACTIC ACID, VENOUS: 1.8 mmol/L (ref 0.5–2.0)

## 2015-05-20 MED ORDER — LEVOFLOXACIN 500 MG PO TABS: 500.0000 mg | ORAL_TABLET | Freq: Every day | ORAL | Status: DC

## 2015-05-20 MED ORDER — PREDNISONE 20 MG PO TABS: 20.0000 mg | ORAL_TABLET | Freq: Every day | ORAL | Status: DC

## 2015-05-20 NOTE — Progress Notes (Signed)
Occupational Therapy Evaluation/Discharge Patient Details Name: Ronald Bell MRN: [id number] DOB: 10-17-1940 Today's Date: 05/20/2015    History of Present Illness 75 y.o. male brought in by ambulance with h/o HTN, HLD, GERD, PVD, CAD, TAVR (8/16), COPD gold III, cardiac stent who presents to the Emergency Department complaining of moderate SOB onset this evening with associated cough, chest congestion   Clinical Impression   Patient has been evaluated by Occupational Therapy with no acute OT needs identified. Pt completed all ADLs and functional transfers at mod I level with no reports of SOB or dizziness and no LOB noted. Reviewed fall prevention and energy conservation strategies for COPD and pt enacting many of these strategies already into daily life. All education has been completed and pt has no further questions. OT signing off. Thank you for this referral.    Follow Up Recommendations  No OT follow up    Equipment Recommendations  None recommended by OT    Recommendations for Other Services       Precautions / Restrictions Precautions Precautions: None Precaution Comments: monitor o2 Restrictions Weight Bearing Restrictions: No      Mobility Bed Mobility               General bed mobility comments: Up in chair on OT arrival  Transfers Overall transfer level: Modified independent Equipment used: None Transfers: Sit to/from Stand Sit to Stand: Modified independent (Device/Increase time)         General transfer comment: No LOB or dizziness reported or noted.     Balance Overall balance assessment: No apparent balance deficits (not formally assessed)                                          ADL Overall ADL's : Modified independent                                       General ADL Comments: Completed all transfers and ADLs without physical assist or AD. No LOB or dizziness noted. Reviewed fall prevention  and energy conservation strategies for COPD - pt already performing these at home.     Vision Vision Assessment?: No apparent visual deficits   Perception     Praxis      Pertinent Vitals/Pain Pain Assessment: No/denies pain     Hand Dominance Right   Extremity/Trunk Assessment Upper Extremity Assessment Upper Extremity Assessment: Overall WFL for tasks assessed   Lower Extremity Assessment Lower Extremity Assessment: Overall WFL for tasks assessed   Cervical / Trunk Assessment Cervical / Trunk Assessment: Normal   Communication Communication Communication: No difficulties   Cognition Arousal/Alertness: Awake/alert Behavior During Therapy: WFL for tasks assessed/performed Overall Cognitive Status: Within Functional Limits for tasks assessed                     General Comments       Exercises       Shoulder Instructions      Home Living Family/patient expects to be discharged to:: Private residence Living Arrangements: Spouse/significant other Available Help at Discharge: Family;Available 24 hours/day Type of Home: House Home Access: Stairs to enter [employer] of Steps: 1   Home Layout: One level     Bathroom Shower/Tub: Teacher, early years/pre: Standard Bathroom Accessibility: Yes  How Accessible: Accessible via walker Home Equipment: Cane - single point;Walker - 4 wheels;Shower seat   Additional Comments: oxygen at night or for long distance mobility      Prior Functioning/Environment Level of Independence: Independent        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    OT Diagnosis:     OT Problem List: Decreased activity tolerance   OT Treatment/Interventions:      OT Goals(Current goals can be found in the care plan section) Acute Rehab OT Goals Patient Stated Goal: to go home this morning OT Goal Formulation: With patient Time For Goal Achievement: 06/03/15 Potential to Achieve Goals: Good  OT Frequency:      Barriers to D/C:            Co-evaluation              End of Session Nurse Communication: Mobility status  Activity Tolerance: Patient tolerated treatment well Patient left: in chair;with call bell/phone within reach   Time: [id number] OT Time Calculation (min): 10 min Charges:  OT General Charges $OT Visit: 1 Procedure OT Evaluation $OT Eval Low Complexity: 1 Procedure G-Codes:    Redmond Baseman, OTR/L [phone] 05/20/2015, 8:59 AM

## 2015-05-20 NOTE — Discharge Summary (Signed)
Physician Discharge Summary  Ronald Bell [id number] DOB: 1941/01/12 DOA: 05/18/2015  PCP: [name], DO  Admit date: 05/18/2015 Discharge date: 05/20/2015  Time spent: 45 minutes  Recommendations for Outpatient Follow-up:  Dr.Lowne in 1 week  Discharge Diagnoses:    COPD exacerbation   Hyperlipidemia   Iron deficiency anemia   Essential hypertension   GERD   Aortic stenosis, severe   Former smoker   S/P TAVR (transcatheter aortic valve replacement)   CAD (coronary artery disease)   Depression   Acute on chronic respiratory failure (HCC)   Elevated lactic acid level   Discharge Condition: stable  Diet recommendation: heart healthy, low sodium  Filed Weights   05/18/15 1227  Weight: 63.504 kg (140 lb)    History of present illness:  CHIEF COMPLAINT: Shortness of breath  HPI: Ronald Bell is a 75 y.o. male with multiple, significant medical problems not limited to hypertension, peripheral vascular disease , coronary artery disease s/p stent placement , severe aortic stenosis status post TAVR, and COPD gold III. He presented to the emergency department with dyspnea. Patient is on chronic O2 at home.2/24 he became short of breath and noticed some wheezing  Hospital Course:  Acute on chronic respiratory failure -due to COPD exacerbation . CXR without any acute findings  -treated with solumedrol levaquin and nebs -FLu PCR negative -transitioned to oral levaquin and prednisone taper at discharge  Elevated lactic acid.  -clinically no evidence of sepsis -improved  CAD,  -severe. Not CABG candidate. Status post PCI/stent placement 2016.  -stable, continue ASA, lopressor/statin -plavix stopped last year due to recurrent Gi bleeding  History of severe AS, s/p TAVR August 2016.   PAD, s/p stenting of iliac artery   Hx of recurrent GI bleeding on Plavix  Iron deficiency anemia . Secondary to gastrointestinal AVMs and polyps.   -Followed by [facility]. Hgb normal. -continue home iron  Hypertension. Controlled.  -continue home lasix in am  Hyperlipidemia -continue home statin + fenofibrate  Depresssion. Stable.  -Continue home celexa  Former smoker. Quit smoking a month ago.    Discharge Exam: Filed Vitals:   05/19/15 2022 05/20/15 0424  BP: 127/53 108/50  Pulse: 78 67  Temp: 97.6 F (36.4 C) 98.4 F (36.9 C)  Resp: 18 19    General: AAOx3 Cardiovascular:S1S2/RRR Respiratory: improved air movement  Discharge Instructions   Discharge Instructions    Diet - low sodium heart healthy    Complete by:  As directed      Increase activity slowly    Complete by:  As directed           Current Discharge Medication List    START taking these medications   Details  levofloxacin (LEVAQUIN) 500 MG tablet Take 1 tablet (500 mg total) by mouth daily. For 3days Qty: 3 tablet, Refills: 0    predniSONE (DELTASONE) 20 MG tablet Take 1-2 tablets (20-40 mg total) by mouth daily with breakfast. Take 40mg  for 2days then 20mg  for 2days then STOP Qty: 6 tablet, Refills: 0      CONTINUE these medications which have NOT CHANGED   Details  albuterol (PROVENTIL HFA;VENTOLIN HFA) 108 (90 BASE) MCG/ACT inhaler Inhale 2 puffs into the lungs every 4 (four) hours as needed for wheezing or shortness of breath. Qty: 1 Inhaler, Refills: 11    ALPRAZolam (XANAX) 0.25 MG tablet Take 1 tablet (0.25 mg total) by mouth 2 (two) times daily as needed. for anxiety Qty: 45 tablet, Refills: 0  Associated Diagnoses: Generalized anxiety disorder    Ascorbic Acid (VITAMIN C) 250 MG tablet Take 500 mg by mouth daily.     aspirin 81 MG chewable tablet Chew 1 tablet (81 mg total) by mouth daily.    atorvastatin (LIPITOR) 40 MG tablet TAKE ONE TABLET BY MOUTH ONCE DAILY Qty: 90 tablet, Refills: 1    citalopram (CELEXA) 10 MG tablet Take 1 tablet (10 mg total) by mouth daily. Qty: 30 tablet, Refills: 3   Associated  Diagnoses: Generalized anxiety disorder    diclofenac sodium (VOLTAREN) 1 % GEL Apply 2 g topically 4 (four) times daily. Qty: 100 g, Refills: 1    docusate sodium (COLACE) 100 MG capsule Take 100 mg by mouth 2 (two) times daily.     fenofibrate 160 MG tablet Take 1 tablet (160 mg total) by mouth daily. Qty: 30 tablet, Refills: 2    ferrous sulfate 325 (65 FE) MG EC tablet Take 1 tablet (325 mg total) by mouth 2 (two) times daily. Qty: 60 tablet, Refills: 11   Associated Diagnoses: Anemia due to chronic blood loss    furosemide (LASIX) 20 MG tablet Take 2 tablets (40 mg total) by mouth daily. Qty: 60 tablet, Refills: 3   Associated Diagnoses: Essential hypertension    metoprolol tartrate (LOPRESSOR) 25 MG tablet Take 0.5 tablets (12.5 mg total) by mouth 2 (two) times daily. Qty: 60 tablet, Refills: 6   Associated Diagnoses: Essential hypertension    Multiple Vitamins-Minerals (CENTRUM SILVER PO) Take 1 tablet by mouth daily.     pantoprazole (PROTONIX) 40 MG tablet Take 1 tablet (40 mg total) by mouth daily.   Associated Diagnoses: Gastroesophageal reflux disease, esophagitis presence not specified    potassium chloride SA (K-DUR,KLOR-CON) 20 MEQ tablet Take 1 tablet (20 mEq total) by mouth daily. Qty: 30 tablet, Refills: 3   Associated Diagnoses: Essential hypertension    temazepam (RESTORIL) 30 MG capsule Take 1 capsule (30 mg total) by mouth at bedtime as needed. for sleep Qty: 30 capsule, Refills: 0   Associated Diagnoses: Insomnia    Umeclidinium-Vilanterol (ANORO ELLIPTA) 62.5-25 MCG/INH AEPB 1 puff once daily every morning Qty: 60 each, Refills: 6    valsartan (DIOVAN) 80 MG tablet Take 1 tablet (80 mg total) by mouth daily. Qty: 30 tablet, Refills: 5   Associated Diagnoses: Essential hypertension       No Known Allergies Follow-up Information    Follow up with [name], DO. Schedule an appointment as soon as possible for a visit in 1 week.   Specialty:   Family Medicine   Contact information:   [address] [place] [address] [phone]        The results of significant diagnostics from this hospitalization (including imaging, microbiology, ancillary and laboratory) are listed below for reference.    Significant Diagnostic Studies: Dg Chest Portable 1 View  05/18/2015  CLINICAL DATA:  Shortness of breath. History of hypertension, hyperlipidemia. EXAM: PORTABLE CHEST 1 VIEW COMPARISON:  CT chest April 09, 2015 FINDINGS: Cardiomediastinal silhouette is normal, mildly calcified aortic knob. Aortic root graft. Scattered granulomata, and calcified pleural plaques better seen on prior CT. LEFT midlung zone scarring without pleural effusion or focal consolidation. No pneumothorax. Old LEFT anterior rib ORIF. IMPRESSION: No acute cardiopulmonary process. Scattered granulomata and calcified pleural plaques better seen on prior CT. Electronically Signed   By: Elon Alas M.D.   On: 05/18/2015 01:39    Microbiology: Recent Results (from the past  240 hour(s))  Culture, blood (Routine X 2) w Reflex to ID Panel     Status: None (Preliminary result)   Collection Time: 05/18/15 10:25 AM  Result Value Ref Range Status   Specimen Description BLOOD LEFT ANTECUBITAL  Final   Special Requests BOTTLES DRAWN AEROBIC AND ANAEROBIC 5CC  Final   Culture NO GROWTH 1 DAY  Final   Report Status PENDING  Incomplete  Culture, blood (Routine X 2) w Reflex to ID Panel     Status: None (Preliminary result)   Collection Time: 05/18/15 10:35 AM  Result Value Ref Range Status   Specimen Description BLOOD LEFT ANTECUBITAL  Final   Special Requests BOTTLES DRAWN AEROBIC AND ANAEROBIC 10CC  Final   Culture NO GROWTH 1 DAY  Final   Report Status PENDING  Incomplete     Labs: Basic Metabolic Panel:  Recent Labs Lab 05/18/15 0033 05/19/15 0610  NA 141 143  K 4.4 4.8  CL 101 109  CO2 26 27  GLUCOSE 136* 142*  BUN 22* 16  CREATININE  1.09 0.93  CALCIUM 10.3 9.4   Liver Function Tests:  Recent Labs Lab 05/19/15 0610  AST 27  ALT 38  ALKPHOS 51  BILITOT 0.4  PROT 6.3*  ALBUMIN 3.2*   No results for input(s): LIPASE, AMYLASE in the last 168 hours. No results for input(s): AMMONIA in the last 168 hours. CBC:  Recent Labs Lab 05/18/15 0033 05/20/15 0543  WBC 9.4 10.5  NEUTROABS 6.0  --   HGB 14.8 12.8*  HCT 44.8 37.7*  MCV 98.0 97.7  PLT 191 163   Cardiac Enzymes:  Recent Labs Lab 05/18/15 1015 05/18/15 1531 05/18/15 2129  TROPONINI <0.03 0.03 0.04*   BNP: BNP (last 3 results)  Recent Labs  05/31/14 2351 12/03/14 1919 05/18/15 0426  BNP 75.7 59.7 50.4    ProBNP (last 3 results) No results for input(s): PROBNP in the last 8760 hours.  CBG: No results for input(s): GLUCAP in the last 168 hours.     SignedDomenic Polite MD.  [facility] 05/20/2015, 10:37 AM

## 2015-05-20 NOTE — Progress Notes (Signed)
IV removed. AVS given to patient. Understanding demonstrated.  Belongings packed. Transportation arranged by patient.

## 2015-05-21 ENCOUNTER — Encounter (HOSPITAL_COMMUNITY): Payer: Self-pay | Admitting: Emergency Medicine

## 2015-05-21 ENCOUNTER — Emergency Department (HOSPITAL_COMMUNITY): Payer: Medicare Other

## 2015-05-21 ENCOUNTER — Telehealth: Payer: Self-pay | Admitting: *Deleted

## 2015-05-21 ENCOUNTER — Emergency Department (HOSPITAL_COMMUNITY)
Admission: EM | Admit: 2015-05-21 | Discharge: 2015-05-21 | Disposition: A | Discharge status: Home / Self Care | Payer: Medicare Other | Attending: Emergency Medicine | Admitting: Emergency Medicine

## 2015-05-21 DIAGNOSIS — M1990 Unspecified osteoarthritis, unspecified site: Secondary | ICD-10-CM | POA: Insufficient documentation

## 2015-05-21 DIAGNOSIS — I10 Essential (primary) hypertension: Secondary | ICD-10-CM | POA: Insufficient documentation

## 2015-05-21 DIAGNOSIS — R069 Unspecified abnormalities of breathing: Secondary | ICD-10-CM | POA: Diagnosis not present

## 2015-05-21 DIAGNOSIS — Z7982 Long term (current) use of aspirin: Secondary | ICD-10-CM | POA: Insufficient documentation

## 2015-05-21 DIAGNOSIS — J441 Chronic obstructive pulmonary disease with (acute) exacerbation: Secondary | ICD-10-CM | POA: Insufficient documentation

## 2015-05-21 DIAGNOSIS — K219 Gastro-esophageal reflux disease without esophagitis: Secondary | ICD-10-CM | POA: Diagnosis not present

## 2015-05-21 DIAGNOSIS — Z792 Long term (current) use of antibiotics: Secondary | ICD-10-CM | POA: Insufficient documentation

## 2015-05-21 DIAGNOSIS — Z8619 Personal history of other infectious and parasitic diseases: Secondary | ICD-10-CM | POA: Diagnosis not present

## 2015-05-21 DIAGNOSIS — D509 Iron deficiency anemia, unspecified: Secondary | ICD-10-CM | POA: Insufficient documentation

## 2015-05-21 DIAGNOSIS — Z86010 Personal history of colonic polyps: Secondary | ICD-10-CM | POA: Insufficient documentation

## 2015-05-21 DIAGNOSIS — Z87891 Personal history of nicotine dependence: Secondary | ICD-10-CM | POA: Diagnosis not present

## 2015-05-21 DIAGNOSIS — Z872 Personal history of diseases of the skin and subcutaneous tissue: Secondary | ICD-10-CM | POA: Insufficient documentation

## 2015-05-21 DIAGNOSIS — F419 Anxiety disorder, unspecified: Secondary | ICD-10-CM | POA: Diagnosis not present

## 2015-05-21 DIAGNOSIS — Z79899 Other long term (current) drug therapy: Secondary | ICD-10-CM | POA: Diagnosis not present

## 2015-05-21 DIAGNOSIS — R0602 Shortness of breath: Secondary | ICD-10-CM | POA: Diagnosis not present

## 2015-05-21 DIAGNOSIS — E785 Hyperlipidemia, unspecified: Secondary | ICD-10-CM | POA: Diagnosis not present

## 2015-05-21 DIAGNOSIS — I2510 Atherosclerotic heart disease of native coronary artery without angina pectoris: Secondary | ICD-10-CM | POA: Insufficient documentation

## 2015-05-21 DIAGNOSIS — Z7952 Long term (current) use of systemic steroids: Secondary | ICD-10-CM | POA: Diagnosis not present

## 2015-05-21 DIAGNOSIS — Z9889 Other specified postprocedural states: Secondary | ICD-10-CM | POA: Insufficient documentation

## 2015-05-21 LAB — BASIC METABOLIC PANEL
ANION GAP: 10 (ref 5–15)
BUN: 14 mg/dL (ref 6–20)
CALCIUM: 9.5 mg/dL (ref 8.9–10.3)
CO2: 27 mmol/L (ref 22–32)
Chloride: 105 mmol/L (ref 101–111)
Creatinine, Ser: 0.78 mg/dL (ref 0.61–1.24)
Glucose, Bld: 109 mg/dL — ABNORMAL HIGH (ref 65–99)
Potassium: 4.2 mmol/L (ref 3.5–5.1)
Sodium: 142 mmol/L (ref 135–145)

## 2015-05-21 LAB — I-STAT CG4 LACTIC ACID, ED: Lactic Acid, Venous: 1.25 mmol/L (ref 0.5–2.0)

## 2015-05-21 LAB — CBC WITH DIFFERENTIAL/PLATELET
BASOS ABS: 0 10*3/uL (ref 0.0–0.1)
BASOS PCT: 0 %
EOS PCT: 1 %
Eosinophils Absolute: 0.1 10*3/uL (ref 0.0–0.7)
HCT: 42.9 % (ref 39.0–52.0)
Hemoglobin: 14 g/dL (ref 13.0–17.0)
Lymphocytes Relative: 19 %
Lymphs Abs: 2.1 10*3/uL (ref 0.7–4.0)
MCH: 32.2 pg (ref 26.0–34.0)
MCHC: 32.6 g/dL (ref 30.0–36.0)
MCV: 98.6 fL (ref 78.0–100.0)
MONO ABS: 1.1 10*3/uL — AB (ref 0.1–1.0)
Monocytes Relative: 10 %
NEUTROS ABS: 7.5 10*3/uL (ref 1.7–7.7)
Neutrophils Relative %: 70 %
PLATELETS: 179 10*3/uL (ref 150–400)
RBC: 4.35 MIL/uL (ref 4.22–5.81)
RDW: 13.5 % (ref 11.5–15.5)
WBC: 10.7 10*3/uL — AB (ref 4.0–10.5)

## 2015-05-21 LAB — BRAIN NATRIURETIC PEPTIDE: B NATRIURETIC PEPTIDE 5: 355.2 pg/mL — AB (ref 0.0–100.0)

## 2015-05-21 LAB — D-DIMER, QUANTITATIVE: D-Dimer, Quant: 0.76 ug/mL-FEU — ABNORMAL HIGH (ref 0.00–0.50)

## 2015-05-21 LAB — I-STAT TROPONIN, ED: TROPONIN I, POC: 0.03 ng/mL (ref 0.00–0.08)

## 2015-05-21 MED ORDER — FUROSEMIDE 10 MG/ML IJ SOLN
40.0000 mg | Freq: Once | INTRAMUSCULAR | Status: AC
  Administered 2015-05-21: 40 mg via INTRAVENOUS
  Filled 2015-05-21: qty 4

## 2015-05-21 MED ORDER — IOHEXOL 350 MG/ML SOLN
100.0000 mL | Freq: Once | INTRAVENOUS | Status: AC | PRN
  Administered 2015-05-21: 55 mL via INTRAVENOUS

## 2015-05-21 NOTE — Telephone Encounter (Signed)
Transition Care Management Follow-up Telephone Call PCP: [name], DO  Admit date: 05/18/2015 Discharge date: 05/20/2015  Recommendations for Outpatient Follow-up:  Dr.Lowne in 1 week  Discharge Diagnoses:   COPD exacerbation  Hyperlipidemia  Iron deficiency anemia  Essential hypertension  GERD  Aortic stenosis, severe  Former smoker  S/P TAVR (transcatheter aortic valve replacement)  CAD (coronary artery disease)  Depression  Acute on chronic respiratory failure (HCC)  Elevated lactic acid level   Discharge Condition: stable  Diet recommendation: heart healthy, low sodium   Date discharged? 05/20/15, ED 05/21/15   How have you been since you were released from the hospital? "Well, I had to go back to the ED again today because I got so short of breath. Everyone is telling me it's the COPD. I feel better now. I'm sitting here doing the bills right now."   Do you understand why you were in the hospital? yes   Do you understand the discharge instructions? yes   Where were you discharged to? Home   Items Reviewed:  Medications reviewed: yes  Allergies reviewed: yes  Dietary changes reviewed: yes, low sodium diet  Referrals reviewed: no, none made   Functional Questionnaire:   Activities of Daily Living (ADLs):   He states they are independent in the following: ambulation, bathing and hygiene, feeding, continence, grooming, toileting and dressing States they require assistance with the following: None   Any transportation issues/concerns?: no   Any patient concerns? no   Confirmed importance and date/time of follow-up visits scheduled yes  Provider Appointment booked with Dr. [name] 05/23/15 @ 4:00 PM  Confirmed with patient if condition begins to worsen call PCP or go to the ER.  Patient was given the office number and encouraged to call back with question or concerns.  : yes, pt given office phone number and encouraged to call  office w/ questions or concerns.

## 2015-05-21 NOTE — ED Notes (Signed)
Patient coming back from ambulating to the bathroom; patient placed back on monitor, continuous pulse oximetry and blood pressure cuff

## 2015-05-21 NOTE — ED Notes (Signed)
Urinal handed to patient; Carmelina Paddock', RN at bedside

## 2015-05-21 NOTE — ED Notes (Signed)
Pt arrives via EMS from home with [facility], d/c'd from hospital day before yesterday. Upon EMS arrival, pt's lips blue, nail beds blue.  Perked up with 10MG  albuterol and 0.5 atrovent via aerosol mask.

## 2015-05-21 NOTE — Discharge Instructions (Signed)

## 2015-05-21 NOTE — ED Notes (Signed)
125MG  solumedrol on board. Pt discharged with levoquin and prednisone that he hasn't picked up from the pharmacy.

## 2015-05-21 NOTE — ED Provider Notes (Signed)
Care assumed from Dr. Dina Rich at 0800 with plan for f/u PE study.   Results:  BP 155/87 mmHg  Pulse 104  Temp(Src) 96.2 F (35.7 C) (Rectal)  Resp 22  Ht 5' 2.5" (1.588 m)  Wt 140 lb (63.504 kg)  BMI 25.18 kg/m2  SpO2 94%  Results for orders placed or performed during the hospital encounter of 05/21/15  CBC with Differential  Result Value Ref Range   WBC 10.7 (H) 4.0 - 10.5 K/uL   RBC 4.35 4.22 - 5.81 MIL/uL   Hemoglobin 14.0 13.0 - 17.0 g/dL   HCT 42.9 39.0 - 52.0 %   MCV 98.6 78.0 - 100.0 fL   MCH 32.2 26.0 - 34.0 pg   MCHC 32.6 30.0 - 36.0 g/dL   RDW 13.5 11.5 - 15.5 %   Platelets 179 150 - 400 K/uL   Neutrophils Relative % 70 %   Neutro Abs 7.5 1.7 - 7.7 K/uL   Lymphocytes Relative 19 %   Lymphs Abs 2.1 0.7 - 4.0 K/uL   Monocytes Relative 10 %   Monocytes Absolute 1.1 (H) 0.1 - 1.0 K/uL   Eosinophils Relative 1 %   Eosinophils Absolute 0.1 0.0 - 0.7 K/uL   Basophils Relative 0 %   Basophils Absolute 0.0 0.0 - 0.1 K/uL  Basic metabolic panel  Result Value Ref Range   Sodium 142 135 - 145 mmol/L   Potassium 4.2 3.5 - 5.1 mmol/L   Chloride 105 101 - 111 mmol/L   CO2 27 22 - 32 mmol/L   Glucose, Bld 109 (H) 65 - 99 mg/dL   BUN 14 6 - 20 mg/dL   Creatinine, Ser 0.78 0.61 - 1.24 mg/dL   Calcium 9.5 8.9 - 10.3 mg/dL   GFR calc non Af Amer >60 >60 mL/min   GFR calc Af Amer >60 >60 mL/min   Anion gap 10 5 - 15  Brain natriuretic peptide  Result Value Ref Range   B Natriuretic Peptide 355.2 (H) 0.0 - 100.0 pg/mL  D-dimer, quantitative (not at [facility])  Result Value Ref Range   D-Dimer, Quant 0.76 (H) 0.00 - 0.50 ug/mL-FEU  I-Stat Troponin, ED (not at [facility])  Result Value Ref Range   Troponin i, poc 0.03 0.00 - 0.08 ng/mL   Comment 3          I-Stat CG4 Lactic Acid, ED  Result Value Ref Range   Lactic Acid, Venous 1.25 0.5 - 2.0 mmol/L    Ct Angio Chest Pe W/cm &/or Wo Cm  05/21/2015  CLINICAL DATA:  Increasing shortness of Breath EXAM: CT ANGIOGRAPHY CHEST WITH  CONTRAST TECHNIQUE: Multidetector CT imaging of the chest was performed using the standard protocol during bolus administration of intravenous contrast. Multiplanar CT image reconstructions and MIPs were obtained to evaluate the vascular anatomy. CONTRAST:  37mL OMNIPAQUE IOHEXOL 350 MG/ML SOLN COMPARISON:  04/09/2015, 05/21/2015 FINDINGS: The lungs are well aerated bilaterally and demonstrate bilateral emphysematous changes. Diffuse bilateral calcified pleural plaques are noted. Some non calcified plaquing is also noted along the diaphragm bilaterally. Scarring is again noted in the lingula stable from the previous exam. A 7 mm spiculated lesion is again noted in the right upper lobe and stable a few scattered other smaller nodules are again seen and stable. No new focal infiltrate is seen. The thoracic inlet is within normal limits. Heavy calcification of the thoracic aorta and its branches are seen. No aneurysmal dilatation is noted. Changes of prior aortic valve replacement are noted.  The pulmonary artery is well visualized and demonstrates a normal branching pattern. No filling defects are identified to suggest pulmonary emboli. No significant hilar or mediastinal adenopathy is noted. A few scattered calcifications are seen likely related to prior granulomatous disease. The visualized upper abdomen is within normal limits. No acute bony abnormality is noted. Review of the MIP images confirms the above findings. IMPRESSION: Multiple calcified and noncalcified pleural plaques stable in appearance. Emphysematous changes. Scattered noncalcified nodules stable in appearance from the previous exam. No evidence of pulmonary embolism is noted. Electronically Signed   By: Inez Catalina M.D.   On: 05/21/2015 08:44   Dg Chest Portable 1 View  05/21/2015  CLINICAL DATA:  Acute onset of shortness of breath. Initial encounter. EXAM: PORTABLE CHEST 1 VIEW COMPARISON:  Chest radiograph from 05/18/2015 FINDINGS: The lungs are  well-aerated. Vascular congestion is noted. Bilateral scarring is again noted, with calcified pleural plaques again seen. No superimposed focal airspace consolidation is identified. No pleural effusion or pneumothorax is identified. The cardiomediastinal silhouette is normal in size. An aortic valve stent is noted. Postoperative change is noted along a left lower rib. No acute osseous abnormalities are seen. IMPRESSION: Vascular congestion noted. Bilateral scarring, with calcified pleural plaques. No acute cardiopulmonary process seen. Electronically Signed   By: Garald Balding M.D.   On: 05/21/2015 05:45    Radiology and laboratory examinations were reviewed by me and used in medical decision making if performed.   MDM:  No PE noted on CT, patient at baseline oxygenation. Well appearing on reassessment. Has appointment with pulmonology in 3 days which I recommended he keep or move up until able. If he continues to have symptoms in the evenings I recommended he turn his O2 up to 3L until he is able to be further evaluated by his primary team and return immediately here if symptoms persist. Plan to follow up with PCP as needed and return precautions discussed for worsening or new concerning symptoms.   Diagnoses that have been ruled out:  None  Diagnoses that are still under consideration:  None  Final diagnoses:  Shortness of breath     Leo Grosser, MD 05/21/15 1017

## 2015-05-21 NOTE — ED Notes (Signed)
Patient d/c'd from both IVs, monitor, continuous pulse oximetry and blood pressure cuff; patient getting dressed to be discharged home

## 2015-05-21 NOTE — ED Provider Notes (Signed)
CSN: [id number]     Arrival date & time 05/21/15  0458 History   First MD Initiated Contact with Patient 05/21/15 0507     Chief Complaint  Patient presents with  . Shortness of Breath     (Consider location/radiation/quality/duration/timing/severity/associated sxs/prior Treatment) HPI  This is a 75 year old male with history COPD, coronary artery disease, who presents with shortness of breath. Patient was just discharged yesterday morning. He was admitted to the hospital for COPD exacerbation. He states that he was doing well. He states he had increasing shortness of breath tonight when he was at home after he went to bed. No fevers at home. He tried his neb at home with minimal relief. He called EMS. Patient was given 125 Solu-Medrol and a DuoNeb en route. He is on a prednisone taper and Levaquin at home. On my evaluation, he reports some improvement of his shortness of breath. Denies chest pain, cough, fever.  Past Medical History  Diagnosis Date  . Polyp of nasal cavity   . Rosacea   . Hx of adenomatous colonic polyps 2012, 2013.   . Tobacco abuse   . Hypertension   . Hyperlipidemia   . GERD (gastroesophageal reflux disease)   . GI bleed 2010    4 units PRBCs  . Peripheral vascular disease ([place])   . Irregular heartbeat   . Shingles   . Carotid artery occlusion   . Angiodysplasia of intestine with hemorrhage     large and SB, gastric AVMs.   . Pleural plaque with presence of asbestos 03/27/2013    Followed in Pulmonary clinic/ [facility]/ [place] - F/u CT 09/08/2013 1. Stable extensive calcified pleural plaque formation consistent with asbestos exposure. 2. Multiple pulmonary nodules are unchanged from the CT of 6 months ago. Given risk factors for lung cancer, continued follow up is recommended with chest CT in 6 months> done 04/20/14 no change >repeat in 12 m in tickle file     . GERD 11/30/2008    Qualifier: Diagnosis of  By: Marijean Niemann CMA, Danielle    . PVD (peripheral vascular  disease) ([place]) 10/18/2012  . Carotid artery stenosis 04/22/2012  . Coronary artery disease   . COPD GOLD III with min reversibilty  08/11/2006    Followed in Pulmonary clinic/ [facility]/ [place] - PFT's 04/28/2013  FEV1 0.88 (40%) with ratio 44 and 14% better p B2 dlco 45 corrects to 83 - Trial of breo 04/28/2013 > improved symptoms  06/09/2013  - spirometry 06/04/2014 FEV1  0.76 (29%) ratio 45      . Iron deficiency anemia 01/25/2009    Qualifier: Diagnosis of  By: Henrene Pastor MD, Docia Chuck   . History of blood transfusion "couple times"    "related to bleeding in colon and esophagus"  . Arthritis     "left shoulder" (10/19/2014)  . Anxiety   . 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 Iliac artery stent Left 2005    CIA  . Esophagogastroduodenoscopy  2012    normal  . Colonoscopy  July 2015    Dr. Henrene Pastor  . Tonsillectomy    . Knee arthroscopy with medial menisectomy Left 03/08/2014    Procedure: LEFT KNEE SCOPE WITH MEDIAL MENISECTOMY AND CHONDROPLASTY;  Surgeon: Ninetta Lights, MD;  Location: [place];  Service: Orthopedics;  Laterality: Left;  . Left and right  heart catheterization with coronary angiogram N/A 06/28/2014    Procedure: LEFT AND RIGHT HEART CATHETERIZATION WITH CORONARY ANGIOGRAM;  Surgeon: Jerline Pain, MD;  Location: [facility] CATH LAB;  Service: Cardiovascular;  Laterality: N/A;  . Esophagogastroduodenoscopy N/A 08/17/2014    Procedure: ESOPHAGOGASTRODUODENOSCOPY (EGD);  Surgeon: Jerene Bears, MD;  Location: [facility] ENDOSCOPY;  Service: Endoscopy;  Laterality: N/A;  . Colonoscopy N/A 08/17/2014    Procedure: COLONOSCOPY;  Surgeon: Jerene Bears, MD;  Location: [facility] ENDOSCOPY;  Service: Endoscopy;  Laterality: N/A;  . Cardiac catheterization  2001; 06/28/2014  . Cardiac catheterization N/A 10/19/2014    Procedure: Coronary Stent Intervention;   Surgeon: Burnell Blanks, MD;  Location: [place] CV LAB;  Service: Cardiovascular;  Laterality: N/A;  BMS Mid RCA  . Transcatheter aortic valve replacement, transapical N/A 11/20/2014    Procedure: TRANSCATHETER AORTIC VALVE REPLACEMENT, TRANSAPICAL;  Surgeon: [name], MD;  Location: [place];  Service: Open Heart Surgery;  Laterality: N/A;  . Tee without cardioversion N/A 11/20/2014    Procedure: TRANSESOPHAGEAL ECHOCARDIOGRAM (TEE);  Surgeon: [name], MD;  Location: [place];  Service: Open Heart Surgery;  Laterality: N/A;  . Rib plating Left 11/20/2014    Procedure: RIB PLATING OF LEFT 8TH RIB;  Surgeon: [name], MD;  Location: [place];  Service: Open Heart Surgery;  Laterality: Left;  . Colonoscopy N/A 12/05/2014    Procedure: COLONOSCOPY;  Surgeon: Manus Gunning, MD;  Location: [place];  Service: Gastroenterology;  Laterality: N/A;  . Enteroscopy N/A 12/05/2014    Procedure: ENTEROSCOPY;  Surgeon: Manus Gunning, MD;  Location: [place];  Service: Gastroenterology;  Laterality: N/A;   Family History  Problem Relation Age of Onset  . Lung disease Mother     pulm fibrosis  . Colitis Father   . Colon cancer Neg Hx   . Heart disease Brother   . Hypertension Brother   . Hyperlipidemia Brother   . Heart disease Daughter     cad  . Hypertension Son    Social History  Substance Use Topics  . Smoking status: Former Smoker -- 1.00 packs/day for 60 years    Types: Cigarettes, E-cigarettes    Quit date: 04/18/2015  . Smokeless tobacco: Never Used     Comment: Patient is using the patch  . Alcohol Use: 8.4 oz/week    14 Glasses of wine per week    Review of Systems  Constitutional: Negative.  Negative for fever.  Respiratory: Positive for cough and shortness of breath. Negative for chest tightness.   Cardiovascular: Negative.  Negative for chest pain and leg swelling.  Gastrointestinal: Negative.   Genitourinary: Negative.  Negative for  dysuria.  Neurological: Negative for headaches.  All other systems reviewed and are negative.     Allergies  Review of patient's allergies indicates no known allergies.  Home Medications   Prior to Admission medications   Medication Sig Start Date End Date Taking? Authorizing Provider  albuterol (PROVENTIL HFA;VENTOLIN HFA) 108 (90 BASE) MCG/ACT inhaler Inhale 2 puffs into the lungs every 4 (four) hours as needed for wheezing or shortness of breath. 01/15/15   Tanda Rockers, MD  ALPRAZolam Duanne Moron) 0.25 MG tablet Take 1 tablet (0.25 mg total) by mouth 2 (two) times daily as needed. for anxiety 05/09/15   Rosalita Chessman, DO  Ascorbic Acid (VITAMIN C) 250 MG tablet Take 500 mg by mouth daily.     Historical Provider, MD  aspirin 81 MG chewable  tablet Chew 1 tablet (81 mg total) by mouth daily. 10/20/14   Brett Canales, PA-C  atorvastatin (LIPITOR) 40 MG tablet TAKE ONE TABLET BY MOUTH ONCE DAILY 02/12/15   Rosalita Chessman, DO  citalopram (CELEXA) 10 MG tablet Take 1 tablet (10 mg total) by mouth daily. 05/09/15   Rosalita Chessman, DO  diclofenac sodium (VOLTAREN) 1 % GEL Apply 2 g topically 4 (four) times daily. 04/25/15   Rosalita Chessman, DO  docusate sodium (COLACE) 100 MG capsule Take 100 mg by mouth 2 (two) times daily.     Historical Provider, MD  fenofibrate 160 MG tablet Take 1 tablet (160 mg total) by mouth daily. 05/15/15   Rosalita Chessman, DO  ferrous sulfate 325 (65 FE) MG EC tablet Take 1 tablet (325 mg total) by mouth 2 (two) times daily. 05/09/15   Rosalita Chessman, DO  furosemide (LASIX) 20 MG tablet Take 2 tablets (40 mg total) by mouth daily. 05/09/15   Rosalita Chessman, DO  levofloxacin (LEVAQUIN) 500 MG tablet Take 1 tablet (500 mg total) by mouth daily. For 3days 05/20/15   Domenic Polite, MD  metoprolol tartrate (LOPRESSOR) 25 MG tablet Take 0.5 tablets (12.5 mg total) by mouth 2 (two) times daily. 05/09/15   Rosalita Chessman, DO  Multiple Vitamins-Minerals (CENTRUM SILVER PO) Take 1 tablet  by mouth daily.     Historical Provider, MD  pantoprazole (PROTONIX) 40 MG tablet Take 1 tablet (40 mg total) by mouth daily. 05/09/15   Rosalita Chessman, DO  potassium chloride SA (K-DUR,KLOR-CON) 20 MEQ tablet Take 1 tablet (20 mEq total) by mouth daily. 05/09/15   Rosalita Chessman, DO  predniSONE (DELTASONE) 20 MG tablet Take 1-2 tablets (20-40 mg total) by mouth daily with breakfast. Take 40mg  for 2days then 20mg  for 2days then STOP 05/20/15   Domenic Polite, MD  temazepam (RESTORIL) 30 MG capsule Take 1 capsule (30 mg total) by mouth at bedtime as needed. for sleep 05/09/15   Rosalita Chessman, DO  Umeclidinium-Vilanterol [facility] ELLIPTA) 62.5-25 MCG/INH AEPB 1 puff once daily every morning 01/18/15   Tanda Rockers, MD  valsartan (DIOVAN) 80 MG tablet Take 1 tablet (80 mg total) by mouth daily. 05/09/15   Alferd Apa Lowne, DO   BP 92/67 mmHg  Pulse 105  Temp(Src) 97.9 F (36.6 C) (Oral)  Resp 26  Ht 5' 2.5" (1.588 m)  Wt 140 lb (63.504 kg)  BMI 25.18 kg/m2  SpO2 97% Physical Exam  Constitutional: He is oriented to person, place, and time.  Elderly, no acute distress  HENT:  Head: Normocephalic and atraumatic.  Cardiovascular: Normal rate, regular rhythm and normal heart sounds.   No murmur heard. Pulmonary/Chest: Effort normal. No respiratory distress. He has wheezes.  Scant wheezing, fair air movement  Abdominal: Soft. Bowel sounds are normal. There is no tenderness. There is no rebound.  Musculoskeletal: He exhibits no edema.  Neurological: He is alert and oriented to person, place, and time.  Skin: Skin is warm and dry.  Psychiatric: He has a normal mood and affect.  Nursing note and vitals reviewed.   ED Course  Procedures (including critical care time) Labs Review Labs Reviewed  CBC WITH DIFFERENTIAL/PLATELET  BASIC METABOLIC PANEL  BRAIN NATRIURETIC PEPTIDE  I-STAT [place], ED  I-STAT CG4 LACTIC ACID, ED    Imaging Review No results found. I have personally reviewed and  evaluated these images and lab results as part of my medical decision-making.  EKG Interpretation   Date/Time:  Tuesday May 21 2015 04:59:30 EST Ventricular Rate:  95 PR Interval:  52 QRS Duration: 90 QT Interval:  424 QTC Calculation: 533 R Axis:   55 Text Interpretation:  Sinus rhythm Short PR interval Borderline T  abnormalities, inferior leads Prolonged QT interval Confirmed by HORTON   MD, COURTNEY ([id number]) on 05/21/2015 5:03:02 AM      MDM   Final diagnoses:  None    Patient presents with shortness of breath. Recent admission for COPD. States that he is on steroids and antibiotics at home. Felt more short of breath last night. He is on home oxygen. No significant wheezing on exam does have scant wheezing. He is afebrile and nontoxic. Vital signs are largely reassuring. Basic workup obtained. Given recurrent shortness of breath with only scant wheezing, d-dimer was sent to evaluate for possible PE. Otherwise troponin and BNP as well as basic labwork was obtained. D-dimer is equivocal at 0.76. Will obtain CT angiogram. Other lab work notable for a BNP of 355. This is somewhat elevated compared to patient's baseline.  He was noted to have mild vascular congestion on chest x-ray. He does take Lasix at home. He was given one IV dose of Lasix. CT angiogram is pending.  If patient has a negative CTA and is feeling better, feel he can likely be discharged home with continued steroids and nebulizers and home oxygen as previously prescribed.  Signed out to Dr. Laneta Simmers.    Merryl Hacker, MD 05/21/15 2258

## 2015-05-22 ENCOUNTER — Encounter: Payer: Self-pay | Admitting: *Deleted

## 2015-05-23 ENCOUNTER — Ambulatory Visit (INDEPENDENT_AMBULATORY_CARE_PROVIDER_SITE_OTHER): Payer: Medicare Other | Admitting: Family Medicine

## 2015-05-23 ENCOUNTER — Encounter: Payer: Self-pay | Admitting: Family Medicine

## 2015-05-23 VITALS — BP 110/66 | HR 91 | Temp 97.8°F | Wt 135.6 lb

## 2015-05-23 DIAGNOSIS — G4700 Insomnia, unspecified: Secondary | ICD-10-CM

## 2015-05-23 DIAGNOSIS — J441 Chronic obstructive pulmonary disease with (acute) exacerbation: Secondary | ICD-10-CM | POA: Diagnosis not present

## 2015-05-23 LAB — CULTURE, BLOOD (ROUTINE X 2)
Culture: NO GROWTH
Culture: NO GROWTH

## 2015-05-23 MED ORDER — UMECLIDINIUM-VILANTEROL 62.5-25 MCG/INH IN AEPB: INHALATION_SPRAY | RESPIRATORY_TRACT | Status: DC

## 2015-05-23 MED ORDER — TEMAZEPAM 30 MG PO CAPS: 30.0000 mg | ORAL_CAPSULE | Freq: Every evening | ORAL | Status: DC | PRN

## 2015-05-23 MED ORDER — IPRATROPIUM-ALBUTEROL 0.5-2.5 (3) MG/3ML IN SOLN: 3.0000 mL | Freq: Four times a day (QID) | RESPIRATORY_TRACT | Status: DC | PRN

## 2015-05-23 MED ORDER — COMPRESSOR NEBULIZER MISC: 1.0000 | Status: DC | PRN

## 2015-05-23 MED ORDER — ALBUTEROL SULFATE HFA 108 (90 BASE) MCG/ACT IN AERS: 2.0000 | INHALATION_SPRAY | RESPIRATORY_TRACT | Status: DC | PRN

## 2015-05-23 NOTE — Patient Instructions (Signed)
Chronic Obstructive Pulmonary Disease Chronic obstructive pulmonary disease (COPD) is a common lung condition in which airflow from the lungs is limited. COPD is a general term that can be used to describe many different lung problems that limit airflow, including both chronic bronchitis and emphysema. If you have COPD, your lung function will probably never return to normal, but there are measures you can take to improve lung function and make yourself feel better. CAUSES   Smoking (common).  Exposure to secondhand smoke.  Genetic problems.  Chronic inflammatory lung diseases or recurrent infections. SYMPTOMS  Shortness of breath, especially with physical activity.  Deep, persistent (chronic) cough with a large amount of thick mucus.  Wheezing.  Rapid breaths (tachypnea).  Gray or bluish discoloration (cyanosis) of the skin, especially in your fingers, toes, or lips.  Fatigue.  Weight loss.  Frequent infections or episodes when breathing symptoms become much worse (exacerbations).  Chest tightness. DIAGNOSIS Your health care provider will take a medical history and perform a physical examination to diagnose COPD. Additional tests for COPD may include:  Lung (pulmonary) function tests.  Chest X-ray.  CT scan.  Blood tests. TREATMENT  Treatment for COPD may include:  Inhaler and nebulizer medicines. These help manage the symptoms of COPD and make your breathing more comfortable.  Supplemental oxygen. Supplemental oxygen is only helpful if you have a low oxygen level in your blood.  Exercise and physical activity. These are beneficial for nearly all people with COPD.  Lung surgery or transplant.  Nutrition therapy to gain weight, if you are underweight.  Pulmonary rehabilitation. This may involve working with a team of health care providers and specialists, such as respiratory, occupational, and physical therapists. HOME CARE INSTRUCTIONS  Take all medicines  (inhaled or pills) as directed by your health care provider.  Avoid over-the-counter medicines or cough syrups that dry up your airway (such as antihistamines) and slow down the elimination of secretions unless instructed otherwise by your health care provider.  If you are a smoker, the most important thing that you can do is stop smoking. Continuing to smoke will cause further lung damage and breathing trouble. Ask your health care provider for help with quitting smoking. He or she can direct you to community resources or hospitals that provide support.  Avoid exposure to irritants such as smoke, chemicals, and fumes that aggravate your breathing.  Use oxygen therapy and pulmonary rehabilitation if directed by your health care provider. If you require home oxygen therapy, ask your health care provider whether you should purchase a pulse oximeter to measure your oxygen level at home.  Avoid contact with individuals who have a contagious illness.  Avoid extreme temperature and humidity changes.  Eat healthy foods. Eating smaller, more frequent meals and resting before meals may help you maintain your strength.  Stay active, but balance activity with periods of rest. Exercise and physical activity will help you maintain your ability to do things you want to do.  Preventing infection and hospitalization is very important when you have COPD. Make sure to receive all the vaccines your health care provider recommends, especially the pneumococcal and influenza vaccines. Ask your health care provider whether you need a pneumonia vaccine.  Learn and use relaxation techniques to manage stress.  Learn and use controlled breathing techniques as directed by your health care provider. Controlled breathing techniques include:  Pursed lip breathing. Start by breathing in (inhaling) through your nose for 1 second. Then, purse your lips as if you were   going to whistle and breathe out (exhale) through the  pursed lips for 2 seconds.  Diaphragmatic breathing. Start by putting one hand on your abdomen just above your waist. Inhale slowly through your nose. The hand on your abdomen should move out. Then purse your lips and exhale slowly. You should be able to feel the hand on your abdomen moving in as you exhale.  Learn and use controlled coughing to clear mucus from your lungs. Controlled coughing is a series of short, progressive coughs. The steps of controlled coughing are: 1. Lean your head slightly forward. 2. Breathe in deeply using diaphragmatic breathing. 3. Try to hold your breath for 3 seconds. 4. Keep your mouth slightly open while coughing twice. 5. Spit any mucus out into a tissue. 6. Rest and repeat the steps once or twice as needed. SEEK MEDICAL CARE IF:  You are coughing up more mucus than usual.  There is a change in the color or thickness of your mucus.  Your breathing is more labored than usual.  Your breathing is faster than usual. SEEK IMMEDIATE MEDICAL CARE IF:  You have shortness of breath while you are resting.  You have shortness of breath that prevents you from:  Being able to talk.  Performing your usual physical activities.  You have chest pain lasting longer than 5 minutes.  Your skin color is more cyanotic than usual.  You measure low oxygen saturations for longer than 5 minutes with a pulse oximeter. MAKE SURE YOU:  Understand these instructions.  Will watch your condition.  Will get help right away if you are not doing well or get worse.   This information is not intended to replace advice given to you by your health care provider. Make sure you discuss any questions you have with your health care provider.   Document Released: 12/17/2004 Document Revised: 03/30/2014 Document Reviewed: 11/03/2012 Elsevier Interactive Patient Education 2016 [employer]  

## 2015-05-23 NOTE — Progress Notes (Signed)
Patient ID: Ronald Bell, male    DOB: 05-28-1940  Age: 75 y.o. MRN: [id number]    Subjective:  Subjective HPI Ronald Bell presents for f/u hosp for copd exac.  He was d/c on 2/ 27 and went back to the ER yesterday because he woke up and could not breathe.  Pt states er gave him neb tx and sent him home.  ER visit and d/c summary reviewed.  Pt is afraid to go to sleep unless he has a nebulizer , in case it occurs again.   Review of Systems  Constitutional: Negative for diaphoresis, appetite change, fatigue and unexpected weight change.  Eyes: Negative for pain, redness and visual disturbance.  Respiratory: Negative for cough, chest tightness, shortness of breath and wheezing.   Cardiovascular: Negative for chest pain, palpitations and leg swelling.  Endocrine: Negative for cold intolerance, heat intolerance, polydipsia, polyphagia and polyuria.  Genitourinary: Negative for dysuria, frequency and difficulty urinating.  Neurological: Negative for dizziness, light-headedness, numbness and headaches.    History Past Medical History  Diagnosis Date  . Polyp of nasal cavity   . Rosacea   . Hx of adenomatous colonic polyps 2012, 2013.   . Tobacco abuse   . Hypertension   . Hyperlipidemia   . GERD (gastroesophageal reflux disease)   . GI bleed 2010    4 units PRBCs  . Peripheral vascular disease ([place])   . Irregular heartbeat   . Shingles   . Carotid artery occlusion   . Angiodysplasia of intestine with hemorrhage     large and SB, gastric AVMs.   . Pleural plaque with presence of asbestos 03/27/2013    Followed in Pulmonary clinic/ [facility]/ [facility] - F/u CT 09/08/2013 1. Stable extensive calcified pleural plaque formation consistent with asbestos exposure. 2. Multiple pulmonary nodules are unchanged from the CT of 6 months ago. Given risk factors for lung cancer, continued follow up is recommended with chest CT in 6 months> done 04/20/14 no change >repeat in 12 m in  tickle file     . GERD 11/30/2008    Qualifier: Diagnosis of  By: Marijean Niemann CMA, Danielle    . PVD (peripheral vascular disease) ([place]) 10/18/2012  . Carotid artery stenosis 04/22/2012  . Coronary artery disease   . COPD GOLD III with min reversibilty  08/11/2006    Followed in Pulmonary clinic/ [facility]/ [facility] - PFT's 04/28/2013  FEV1 0.88 (40%) with ratio 44 and 14% better p B2 dlco 45 corrects to 83 - Trial of breo 04/28/2013 > improved symptoms  06/09/2013  - spirometry 06/04/2014 FEV1  0.76 (29%) ratio 45      . Iron deficiency anemia 01/25/2009    Qualifier: Diagnosis of  By: Henrene Pastor MD, Docia Chuck   . History of blood transfusion "couple times"    "related to bleeding in colon and esophagus"  . Arthritis     "left shoulder" (10/19/2014)  . Anxiety   . 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    26 mm Edwards Sapien XT transcatheter heart valve placed via transapical approach    He has past surgical history that includes Iliac artery stent (Left, 2005); Esophagogastroduodenoscopy (2012); Colonoscopy (July 2015); Tonsillectomy; Knee arthroscopy with medial menisectomy (Left, 03/08/2014); left and right heart catheterization with coronary angiogram (N/A, 06/28/2014); Esophagogastroduodenoscopy (N/A, 08/17/2014); Colonoscopy (N/A, 08/17/2014); Cardiac catheterization (2001; 06/28/2014); Cardiac catheterization (N/A, 10/19/2014); Transcatheter aortic valve replacement, transapical (N/A, 11/20/2014); TEE without cardioversion (N/A, 11/20/2014); Rib plating (Left, 11/20/2014); Colonoscopy (N/A, 12/05/2014);  and enteroscopy (N/A, 12/05/2014).   His family history includes CAD in his daughter; Colitis in his father; Heart disease in his brother; Hyperlipidemia in his brother; Hypertension in his brother and son; Lung disease in his mother. There is no history of Colon cancer.He reports that he quit smoking about 5 weeks ago. His smoking use included Cigarettes and E-cigarettes. He has a 60 pack-year  smoking history. He has never used smokeless tobacco. He reports that he drinks about 8.4 oz of alcohol per week. He reports that he does not use illicit drugs.  Current Outpatient Prescriptions on File Prior to Visit  Medication Sig Dispense Refill  . ALPRAZolam (XANAX) 0.25 MG tablet Take 1 tablet (0.25 mg total) by mouth 2 (two) times daily as needed. for anxiety 45 tablet 0  . Ascorbic Acid (VITAMIN C) 250 MG tablet Take 500 mg by mouth daily.     Marland Kitchen aspirin 81 MG chewable tablet Chew 1 tablet (81 mg total) by mouth daily.    Marland Kitchen atorvastatin (LIPITOR) 40 MG tablet TAKE ONE TABLET BY MOUTH ONCE DAILY 90 tablet 1  . citalopram (CELEXA) 10 MG tablet Take 1 tablet (10 mg total) by mouth daily. 30 tablet 3  . diclofenac sodium (VOLTAREN) 1 % GEL Apply 2 g topically 4 (four) times daily. (Patient taking differently: Apply 2 g topically 4 (four) times daily as needed. ) 100 g 1  . docusate sodium (COLACE) 100 MG capsule Take 100 mg by mouth 2 (two) times daily.     . fenofibrate 160 MG tablet Take 1 tablet (160 mg total) by mouth daily. 30 tablet 2  . ferrous sulfate 325 (65 FE) MG EC tablet Take 1 tablet (325 mg total) by mouth 2 (two) times daily. 60 tablet 11  . furosemide (LASIX) 20 MG tablet Take 2 tablets (40 mg total) by mouth daily. 60 tablet 3  . metoprolol tartrate (LOPRESSOR) 25 MG tablet Take 0.5 tablets (12.5 mg total) by mouth 2 (two) times daily. 60 tablet 6  . Multiple Vitamins-Minerals (CENTRUM SILVER PO) Take 1 tablet by mouth daily.     . pantoprazole (PROTONIX) 40 MG tablet Take 1 tablet (40 mg total) by mouth daily.    . potassium chloride SA (K-DUR,KLOR-CON) 20 MEQ tablet Take 1 tablet (20 mEq total) by mouth daily. 30 tablet 3  . valsartan (DIOVAN) 80 MG tablet Take 1 tablet (80 mg total) by mouth daily. 30 tablet 5   No current facility-administered medications on file prior to visit.     Objective:  Objective Physical Exam  Constitutional: He is oriented to person,  place, and time. Vital signs are normal. He appears well-developed and well-nourished. He is sleeping.  HENT:  Head: Normocephalic and atraumatic.  Mouth/Throat: Oropharynx is clear and moist.  Eyes: EOM are normal. Pupils are equal, round, and reactive to light.  Neck: Normal range of motion. Neck supple. No thyromegaly present.  Cardiovascular: Normal rate and regular rhythm.   No murmur heard. Pulmonary/Chest: Breath sounds normal. No respiratory distress. He has no wheezes. He has no rales. He exhibits no tenderness.  Pt has no sob at rest but + DOE   Musculoskeletal: He exhibits no edema or tenderness.  Neurological: He is alert and oriented to person, place, and time.  Skin: Skin is warm and dry.  Psychiatric: He has a normal mood and affect. His behavior is normal. Judgment and thought content normal.  Nursing note and vitals reviewed.  BP 110/66 mmHg  Pulse  91  Temp(Src) 97.8 F (36.6 C) (Oral)  Wt 135 lb 9.6 oz (61.508 kg)  SpO2 91% Wt Readings from Last 3 Encounters:  05/23/15 135 lb 9.6 oz (61.508 kg)  05/21/15 140 lb (63.504 kg)  05/18/15 140 lb (63.504 kg)     Lab Results  Component Value Date   WBC 10.7* 05/21/2015   HGB 14.0 05/21/2015   HCT 42.9 05/21/2015   PLT 179 05/21/2015   GLUCOSE 109* 05/21/2015   CHOL 212* 05/09/2015   TRIG 270.0* 05/09/2015   HDL 60.80 05/09/2015   LDLDIRECT 114.0 05/09/2015   LDLCALC 80 12/17/2014   ALT 38 05/19/2015   AST 27 05/19/2015   NA 142 05/21/2015   K 4.2 05/21/2015   CL 105 05/21/2015   CREATININE 0.78 05/21/2015   BUN 14 05/21/2015   CO2 27 05/21/2015   TSH 1.206 06/01/2014   PSA 1.65 05/09/2015   INR 1.27 12/03/2014   HGBA1C 6.1* 11/16/2014   MICROALBUR 1.1 03/05/2014    Ct Angio Chest Pe W/cm &/or Wo Cm  05/21/2015  CLINICAL DATA:  Increasing shortness of Breath EXAM: CT ANGIOGRAPHY CHEST WITH CONTRAST TECHNIQUE: Multidetector CT imaging of the chest was performed using the standard protocol during bolus  administration of intravenous contrast. Multiplanar CT image reconstructions and MIPs were obtained to evaluate the vascular anatomy. CONTRAST:  67mL OMNIPAQUE IOHEXOL 350 MG/ML SOLN COMPARISON:  04/09/2015, 05/21/2015 FINDINGS: The lungs are well aerated bilaterally and demonstrate bilateral emphysematous changes. Diffuse bilateral calcified pleural plaques are noted. Some non calcified plaquing is also noted along the diaphragm bilaterally. Scarring is again noted in the lingula stable from the previous exam. A 7 mm spiculated lesion is again noted in the right upper lobe and stable a few scattered other smaller nodules are again seen and stable. No new focal infiltrate is seen. The thoracic inlet is within normal limits. Heavy calcification of the thoracic aorta and its branches are seen. No aneurysmal dilatation is noted. Changes of prior aortic valve replacement are noted. The pulmonary artery is well visualized and demonstrates a normal branching pattern. No filling defects are identified to suggest pulmonary emboli. No significant hilar or mediastinal adenopathy is noted. A few scattered calcifications are seen likely related to prior granulomatous disease. The visualized upper abdomen is within normal limits. No acute bony abnormality is noted. Review of the MIP images confirms the above findings. IMPRESSION: Multiple calcified and noncalcified pleural plaques stable in appearance. Emphysematous changes. Scattered noncalcified nodules stable in appearance from the previous exam. No evidence of pulmonary embolism is noted. Electronically Signed   By: Inez Catalina M.D.   On: 05/21/2015 08:44   Dg Chest Portable 1 View  05/21/2015  CLINICAL DATA:  Acute onset of shortness of breath. Initial encounter. EXAM: PORTABLE CHEST 1 VIEW COMPARISON:  Chest radiograph from 05/18/2015 FINDINGS: The lungs are well-aerated. Vascular congestion is noted. Bilateral scarring is again noted, with calcified pleural plaques  again seen. No superimposed focal airspace consolidation is identified. No pleural effusion or pneumothorax is identified. The cardiomediastinal silhouette is normal in size. An aortic valve stent is noted. Postoperative change is noted along a left lower rib. No acute osseous abnormalities are seen. IMPRESSION: Vascular congestion noted. Bilateral scarring, with calcified pleural plaques. No acute cardiopulmonary process seen. Electronically Signed   By: Garald Balding M.D.   On: 05/21/2015 05:45     Assessment & Plan:  Plan I have discontinued Mr. Gentilcore levofloxacin and predniSONE. I am also having him  start on ipratropium-albuterol and Compressor Nebulizer. Additionally, I am having him maintain his Multiple Vitamins-Minerals (CENTRUM SILVER PO), vitamin C, docusate sodium, aspirin, atorvastatin, diclofenac sodium, citalopram, ALPRAZolam, valsartan, potassium chloride SA, pantoprazole, metoprolol tartrate, furosemide, ferrous sulfate, fenofibrate, umeclidinium-vilanterol, temazepam, and albuterol.  Meds ordered this encounter  Medications  . ipratropium-albuterol (DUONEB) 0.5-2.5 (3) MG/3ML SOLN    Sig: Take 3 mLs by nebulization every 6 (six) hours as needed.    Dispense:  360 mL    Refill:  3  . Nebulizers (COMPRESSOR NEBULIZER) MISC    Sig: 1 Device by Does not apply route as needed.    Dispense:  1 each    Refill:  0    Patient [facility] O[facility] from [facility]. Dx code:ICD-10-CM: J44.1  . umeclidinium-vilanterol (ANORO ELLIPTA) 62.5-25 MCG/INH AEPB    Sig: 1 puff once daily every morning    Dispense:  60 each    Refill:  6  . temazepam (RESTORIL) 30 MG capsule    Sig: Take 1 capsule (30 mg total) by mouth at bedtime as needed. for sleep    Dispense:  30 capsule    Refill:  0  . albuterol (PROVENTIL HFA;VENTOLIN HFA) 108 (90 Base) MCG/ACT inhaler    Sig: Inhale 2 puffs into the lungs every 4 (four) hours as needed for wheezing or shortness of breath.    Dispense:  1 Inhaler     Refill:  11    Problem List Items Addressed This Visit    COPD exacerbation ([place]) - Primary    Pt is afraid to sleep at night because of the sob He prefers not to wait for neb until he sees pulmonary tomorrow He is asymptomatic at rest  Will try to get a neb delivered to night.  Pt [facility] F/u pulm tomorrow      Relevant Medications   ipratropium-albuterol (DUONEB) 0.5-2.5 (3) MG/3ML SOLN   umeclidinium-vilanterol (ANORO ELLIPTA) 62.5-25 MCG/INH AEPB   albuterol (PROVENTIL HFA;VENTOLIN HFA) 108 (90 Base) MCG/ACT inhaler    Other Visit Diagnoses    Insomnia        Relevant Medications    temazepam (RESTORIL) 30 MG capsule       Follow-up: Return in about 3 months (around 08/23/2015), or if symptoms worsen or fail to improve.  [name], DO

## 2015-05-23 NOTE — Progress Notes (Signed)
Pre visit review using our clinic review tool, if applicable. No additional management support is needed unless otherwise documented below in the visit note. 

## 2015-05-23 NOTE — Assessment & Plan Note (Signed)
Pt is afraid to sleep at night because of the sob He prefers not to wait for neb until he sees pulmonary tomorrow He is asymptomatic at rest  Will try to get a neb delivered to night.  Pt does not [facility] to [facility] F/u pulm tomorrow

## 2015-05-24 ENCOUNTER — Ambulatory Visit (INDEPENDENT_AMBULATORY_CARE_PROVIDER_SITE_OTHER): Payer: Medicare Other | Admitting: Internal Medicine

## 2015-05-24 ENCOUNTER — Encounter: Payer: Self-pay | Admitting: Internal Medicine

## 2015-05-24 VITALS — BP 124/72 | HR 80 | Ht 62.0 in | Wt 124.8 lb

## 2015-05-24 DIAGNOSIS — J9611 Chronic respiratory failure with hypoxia: Secondary | ICD-10-CM | POA: Diagnosis not present

## 2015-05-24 DIAGNOSIS — J449 Chronic obstructive pulmonary disease, unspecified: Secondary | ICD-10-CM | POA: Diagnosis not present

## 2015-05-24 DIAGNOSIS — J9612 Chronic respiratory failure with hypercapnia: Secondary | ICD-10-CM

## 2015-05-24 MED ORDER — GLYCOPYRROLATE-FORMOTEROL 9-4.8 MCG/ACT IN AERO: 2.0000 | INHALATION_SPRAY | Freq: Two times a day (BID) | RESPIRATORY_TRACT | Status: DC

## 2015-05-24 MED ORDER — FAMOTIDINE 20 MG PO TABS: ORAL_TABLET | ORAL | Status: DC

## 2015-05-24 NOTE — Progress Notes (Signed)
Subjective:    Patient ID: Ronald Bell, male    DOB: 07/30/1940  MRN: [id number]  Brief patient profile:  73 yowm  Quit smoking 12/2014  from [place] with variable cough starting Fall on Nov 2014 referred 03/27/2013 by Dr Etter Sjogren for abn CT c/w asbestosis with GOLD III copd with min  reversibility by pfts 04/28/13 and ? acei cough   History of Present Illness  03/27/2013 1st [place] [facility] office visit/ [name] still active smoker cc recurrent episodes of severe refractory cough this episode started x 3 weeks prior to OV   but comes and goes since early fall 2014 assoc with doe x  walking garbage to street x 3 years better up to several hours after combivent on tudorza bid miant as well.  Cough is variably prod of thick yellow mucus but min amt, mostly prod in am and dry rest of the day.  Has noct choking spells just about every night p lies down better if sits up and takes combivent - they feel almost  like something strangling him.  rec Stop lisinopril Benicar 20/12.5 one daily  Pantoprazole (protonix) 40 mg   Take 30-60 min before first meal of the day and Pepcid ac 20 mg one bedtime until return to office   The key is to stop smoking completely before smoking completely stops you!    04/04/2013  Acute  ov/[facility] re: refractory cough  Chief Complaint  Patient presents with  . Acute Visit    Pt c/o increased cough since last visit. Cough is non prod and worse at night and early am. He states that he gets SOB with coughing spells and is using combivent 5 times daily. He states had episode of syncope last night and EMS was called.    really not sob unless coughing. No excess mucus, present 24/7  rec Stop tudorza Prednisone 10 mg take  4 each am x 2 days,   2 each am x 2 days,  1 each am x 2 days and stop  mucinex dm 1200 twice daily as long as you feel the urge to cough and supplement it with Tramadol 50 up to 2 to every 4 hours- once the cough is gone back off of it. Prednisone 10 mg take  4  each am x 2 days,   2 each am x 2 days,  1 each am x 2 days and stop  Pantoprazole (protonix) 40 mg   Take 30-60 min before first meal of the day and Pepcid 20 mg one bedtime until return to office - this is the best way to tell whether stomach acid is contributing to your problem.   GERD diet   04/28/2013 f/u ov/[facility] re: GOLD III COPD / cough much better off ACEi Chief Complaint  Patient presents with  . Follow-up    Pt states breathing is slightly improved and cough is much improved. He is using combivent approx 4 times per day.   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 Acute Visit    Pt c/o increased SOB off and on since Feb 2016. He states that he wakes up in the night feeling out of breath and gets SOB walking from room to room at home. He is using combivent 5-6 x per day.    sleeping propped up now using noct combivent as well as q am breo   Cancelled ov with Dr [name] today "I wanted to get my breathing right first" rec Prednisone 10 mg take  4 each am x 2 days,   2 each am x 2 days,  1 each am x 2 days and stop  Add pepcid 20 mg at bedtime (over the counter) Prop up as much as possible at bedtime  No change Breo each am combivent up to 1 puff every 6 hours as needed  Keep appt with T  surgery    Last T surgery note  12/24/14  Patient returns for routine follow-up up approximately one month status post transcatheter aortic valve replacement using a 26 mm Edwards Sapien XT transcatheter heart valve placed via trans-apical approach on 11/20/2014. His early postoperative recovery in the hospital was notable for paroxysmal atrial fibrillation for which he was treated with amiodarone. He was discharged from the hospital on the seventh postoperative day in sinus rhythm. Because of his history of recurrent GI bleeding in the past he was not anticoagulated using warfarin. Dual antiplatelets therapy using aspirin and Plavix was resumed because of recent PCI and stenting of the right coronary artery. He was readmitted to the hospital one week later with melena and symptomatic anemia with hemoglobin 5.1. He was transfused a total of 5 units packed red blood cells and underwent both upper and lower endoscopy with ablation of multiple AVMs. During his hospitalization he also underwent left thoracentesis for a moderate sized left pleural effusion. He was eventually discharged home and he returns to our office today for routine follow-up. He was seen in follow-up earlier today by Dr. Henrene Pastor. He has not had any further signs of GI bleeding and his hemoglobin has remained stable 11.5. The patient states that he is still limited by exertional shortness of breath that is multifactorial and related to his underlying COPD and chronic diastolic congestive heart failure. He has not had any chest pain or chest tightness. Appetite is good. Home health physical therapy has been working with him and he has been making slow but steady progress. He does not wish to participate in outpatient cardiac rehabilitation program.    01/10/2015  Ext f/u  ov/[facility] re:  Re-establish care post op COPD GOLD III on Breo and prn combivent  Chief Complaint  Patient presents with  . Follow-up    COPD f/u pt. states breathing has  wrosen. SOB with activity. no wheezing. no cough. no chest pain/tightness. on 3L O2 pulse.   Can walk up and back driveway RA  2 x then checks 02 sats 88-89% then walks 2 more  Times was easier before surgery and not on 02 at that point  but still on BREO Finished amio one week prior to OV   Wearing 02 hs  3lpm  rec Stop breo and  combivent start stiolto 2 pffs each am  Only use your albuterol (proair) as a rescue medication 02  2lpm sleeping and walking  No need for 02 inside the house with activties during the day  unless excercising  Prednisone 10 mg take  4 each am x 2 days,   2 each am x 2 days,  1 each am x 2 days and stop  Please schedule a follow up office visit in 6 weeks, call sooner if needed with pfts    02/22/2015  f/u ov/[facility] re: GOLD III copd/ still smoking on anoro / alb 2 puffs around lunch time  Chief Complaint  Patient presents with  . Follow-up    pt following fore COPD with PFT today: pt c/o the regular SOB, and dry cough at times. no c/o wheezing or chest tightness.   walking up to 4 min back and forth to mb on 2lpm   rec Continue Anoro one click each am   Only use your albuterol (proair) as a rescue medication  02  2lpm sleeping and walking  No need for 02 inside the house with activties during the day  unless excercising   Admit date: 05/18/2015 Discharge date: 05/20/2015  Discharge Diagnoses:   COPD exacerbation  Hyperlipidemia  Iron deficiency anemia  Essential hypertension  GERD  Aortic stenosis, severe  Former smoker  S/P TAVR (transcatheter aortic valve replacement)  CAD (coronary artery disease)  Depression  Acute on chronic respiratory failure (HCC)  Elevated lactic acid level   05/24/2015  Extended transition of care  ov/[facility] re: COPD  GOLD III criteria/ 02 2 with 2lpm with activity and hs   Chief Complaint  Patient presents with  . Follow-up    Recently admitted to hospital from 05/18/15-03/18/16 with COPD flare. He states had to  to back to ED on 05/21/15 with SOB and had neg CT Angio. He states that his breathing seems better today and breathing is back at baseline.   on ppi pc and anoro plus freq saba in multiple forms   No obvious   day to day or daytime variabilty or assoc excess/ purulent sputum or mucus plugs   cp or chest tightness, subjective wheeze overt sinus or hb symptoms. No unusual exp hx or h/o childhood pna/ asthma or knowledge of premature birth.  Sleeping ok without nocturnal  or early am exacerbation  of respiratory  c/o's or need for noct saba. Also denies any obvious fluctuation of symptoms with weather or environmental changes or other aggravating or alleviating factors except as outlined above   Current Medications, Allergies, Complete Past Medical History, Past Surgical History, Family History, and Social History were reviewed in [employer] record.  ROS  The following are not active complaints unless bolded sore throat, dysphagia, dental problems, itching, sneezing,  nasal congestion or excess/ purulent secretions, ear ache,   fever, chills, sweats, unintended wt loss, pleuritic or exertional cp, hemoptysis,  orthopnea pnd or leg swelling, presyncope, palpitations, heartburn, abdominal pain, anorexia, nausea, vomiting, diarrhea  or change in bowel or urinary habits, change in stools or urine, dysuria,hematuria,  rash, arthralgias, visual complaints, headache, numbness weakness or ataxia or problems with walking or coordination,  change in mood/affect or memory.          Objective:   Physical Exam    amb wm nad      06/04/2014        137 > 01/10/2015  136 > 02/22/2015  138 > 05/24/2015  135    06/09/13 133 lb (60.328 kg)  04/28/13 128 lb (58.06 kg)  04/04/13 130 lb (58.968 kg)     HEENT mild turbinate edema.  Oropharynx no thrush or excess pnd or cobblestoning.  No JVD or cervical adenopathy. Mild accessory muscle hypertrophy. Trachea midline, nl thryroid. Chest was  hyperinflated by percussion with diminished breath sounds and moderate increased exp time with trace bilateral late exp wheeze. Hoover sign positive at mid inspiration. Regular rate and rhythm with  no  gallop or rub or increase P2 or edema.  Abd: no hsm, nl excursion. Ext warm without cyanosis or clubbing.         I personally reviewed images and agree with radiology impression as follows:  CTaChest   05/21/15 Multiple calcified and noncalcified pleural plaques stable in appearance.  Emphysematous changes.  Scattered noncalcified nodules stable in appearance from the previous exam.  No evidence of pulmonary embolism is noted.       Assessment & Plan:

## 2015-05-24 NOTE — Patient Instructions (Addendum)
Plan A = Automatic = stop Anoro and try BEVESPI  Take 2 puffs first thing in am and then another 2 puffs about 12 hours later.                                     Change protonix to 40mg  Take 30-60 min before first meal of the day and add Pepcid ac 20 mg at bedtime    Plan B = Backup Only use your albuterol as a rescue medication to be used if you can't catch your breath by resting or doing a relaxed purse lip breathing pattern.  - The less you use it, the better it will work when you need it. - Ok to use up to 2 puffs  every 4 hours if you must but call for   appointment if use goes up over your usual need - Don't leave home without it !!  (think of it like the spare tire for your car)   Plan C = Crisis - only use your albuterol nebulizer if you first try Plan B and it fails to help > ok to use the nebulizer up to every 4 hours but if start needing it regularly call for immediate appointment   Plan D = Doctor - call me if B and C not adequate  Plan E = ER - go to ER or call 911 if all else fails    Please schedule a follow up office visit in 6 weeks, call sooner if needed

## 2015-05-25 NOTE — Assessment & Plan Note (Addendum)
HC03  12/17/14  32  01/10/2015   Walked RA  2 laps @ 185 ft each stopped due to  Sob with sat 89% at nl pace - 05/24/2015  Walked 2lpm x 3 laps @ 185 ft each stopped due to  End of study, nl pace, no  desat  Min sob   rec as of 05/24/2015  02 2lpm sleeping and exercising - otherwise no 02 needed

## 2015-05-25 NOTE — Assessment & Plan Note (Addendum)
-   PFT's 04/28/2013  FEV1 0.88 (40%) with ratio 44 and 14% better p B2 dlco 45 corrects to 83 - Trial of breo 04/28/2013 > improved symptoms  06/09/2013  - spirometry 06/04/2014 FEV1  0.76 (29%) ratio 45  -01/10/2015   trial of stiolto > insurance declined  - PFT's  02/22/2015  FEV1 0.84 (40 % ) ratio 41  p 5 % improvement from saba with DLCO  40 % corrects to 51 % for alv volume   - 02/22/15  rec rehab/ declined - 05/24/2015  extensive coaching HFA effectiveness =    75% > try change anoro to BEVESPI    Extremely difficult symptom control > two trips to ER.  DDX of  difficult airways management almost all start with A and  include Adherence, Ace Inhibitors, Acid Reflux, Active Sinus Disease, Alpha 1 Antitripsin deficiency, Anxiety masquerading as Airways dz,  ABPA,  Allergy(esp in young), Aspiration (esp in elderly), Adverse effects of meds,  Active smokers, A bunch of PE's (a small clot burden can't cause this syndrome unless there is already severe underlying pulm or vascular dz with poor reserve) plus two Bs  = Bronchiectasis and Beta blocker use..and one C= CHF  Adherence is always the initial "prime suspect" and is a multilayered concern that requires a "trust but verify" approach in every patient - starting with knowing how to use medications, especially inhalers, correctly, keeping up with refills and understanding the fundamental difference between maintenance and prns vs those medications only taken for a very short course and then stopped and not refilled.  - - The proper method of use, as well as anticipated side effects, of a metered-dose inhaler are discussed and demonstrated to the patient. Improved effectiveness after extensive coaching during this visit to a level of approximately 75 % from a baseline of 50 %  > try BEVESPI  ? Acid (or non-acid) GERD > always difficult to exclude as up to 75% of pts in some series report no assoc GI/ Heartburn symptoms> rec max (24h)  acid suppression and diet  restrictions/ reviewed and instructions given in writing.   ? Anxiety > usually at the bottom of this list of usual suspects but should be much higher on this pt's based on H and P and note already on psychotropics .   ? Allergy > finish pred taper  ? BB effect > unlikely with such a low dose of lopressor   ? Chf > bnp intermediate, need to keep in ddx as has GI diastolic dysfunction   I had an extended discussion with the patient reviewing all relevant studies completed to date and  lasting  25  minutes of a 40 minute   transition of care visit    Each maintenance medication was reviewed in detail including most importantly the difference between maintenance and prns and under what circumstances the prns are to be triggered using an action plan format that is not reflected in the computer generated alphabetically organized AVS.    Please see instructions for details which were reviewed in writing and the patient given a copy highlighting the part that I personally wrote and discussed at today's ov.

## 2015-05-27 ENCOUNTER — Ambulatory Visit (INDEPENDENT_AMBULATORY_CARE_PROVIDER_SITE_OTHER): Payer: Medicare Other | Admitting: Cardiology

## 2015-05-27 ENCOUNTER — Encounter: Payer: Self-pay | Admitting: Cardiology

## 2015-05-27 VITALS — BP 130/50 | HR 73 | Ht 62.5 in | Wt 136.2 lb

## 2015-05-27 DIAGNOSIS — I350 Nonrheumatic aortic (valve) stenosis: Secondary | ICD-10-CM | POA: Diagnosis not present

## 2015-05-27 DIAGNOSIS — Z954 Presence of other heart-valve replacement: Secondary | ICD-10-CM

## 2015-05-27 DIAGNOSIS — Z952 Presence of prosthetic heart valve: Secondary | ICD-10-CM

## 2015-05-27 DIAGNOSIS — I2583 Coronary atherosclerosis due to lipid rich plaque: Secondary | ICD-10-CM

## 2015-05-27 DIAGNOSIS — I2510 Atherosclerotic heart disease of native coronary artery without angina pectoris: Secondary | ICD-10-CM

## 2015-05-27 DIAGNOSIS — J438 Other emphysema: Secondary | ICD-10-CM

## 2015-05-27 DIAGNOSIS — E782 Mixed hyperlipidemia: Secondary | ICD-10-CM

## 2015-05-27 NOTE — Progress Notes (Signed)
Cardiology Office Note   Date:  05/27/2015   ID:  Ronald Bell, Ronald Bell October 10, 1940, MRN [id number]  PCP:  [name], DO  Cardiologist:   Candee Furbish, MD       History of Present Illness: Ronald Bell is a 75 y.o. male who presents for follow-up of severe aortic stenosis status post transcatheter aortic valve replacement using 26 mm Edwards heart valve placed via transapical approach (TAVR). Paroxysmal atrial fibrillation was noted and treated with amiodarone. He was not anticoagulated because of recurrent GI bleeding. Recent right coronary artery PCI and stenting with aspirin and Plavix noted. One week later, he was admitted to the hospital again with melena and hemoglobin was 5.1. He was transfused 5 units of blood. Endoscopic ablation with multiple AVMs noted. He had a left thoracentesis as well for moderate size left pleural effusion. Hemoglobin has remained stable at 11.5. He also saw Dr. [name] of pulmonary medicine. On O2 only at night or walking.   Currently he is only on low-dose aspirin. No appreciable leak on echocardiogram.  He is now off of his amiodarone. He is maintaining normal rhythm. On 02/18/15 he also stopped his fenofibrate. Trigs were 128 last check.  He was admitted from 2/25 through  05/20/15 for COPD flare had to go back to the emergency department on 2/28 , CT angiogram was negative.  In the past I tried to stop his fiber 8 however his triglycerides went up to 270 and this was restarted by Dr. Etter Sjogren.    Past Medical History  Diagnosis Date  . Polyp of nasal cavity   . Rosacea   . Hx of adenomatous colonic polyps 2012, 2013.   . Tobacco abuse   . Hypertension   . Hyperlipidemia   . GERD (gastroesophageal reflux disease)   . GI bleed 2010    4 units PRBCs  . Peripheral vascular disease ([place])   . Irregular heartbeat   . Shingles   . Carotid artery occlusion   . Angiodysplasia of intestine with hemorrhage     large and SB, gastric AVMs.   .  Pleural plaque with presence of asbestos 03/27/2013    Followed in Pulmonary clinic/ [facility]/ [place] - F/u CT 09/08/2013 1. Stable extensive calcified pleural plaque formation consistent with asbestos exposure. 2. Multiple pulmonary nodules are unchanged from the CT of 6 months ago. Given risk factors for lung cancer, continued follow up is recommended with chest CT in 6 months> done 04/20/14 no change >repeat in 12 m in tickle file     . GERD 11/30/2008    Qualifier: Diagnosis of  By: Marijean Niemann CMA, Danielle    . PVD (peripheral vascular disease) ([place]) 10/18/2012  . Carotid artery stenosis 04/22/2012  . Coronary artery disease   . COPD GOLD III with min reversibilty  08/11/2006    Followed in Pulmonary clinic/ [facility]/ [place] - PFT's 04/28/2013  FEV1 0.88 (40%) with ratio 44 and 14% better p B2 dlco 45 corrects to 83 - Trial of breo 04/28/2013 > improved symptoms  06/09/2013  - spirometry 06/04/2014 FEV1  0.76 (29%) ratio 45      . Iron deficiency anemia 01/25/2009    Qualifier: Diagnosis of  By: Henrene Pastor MD, Docia Chuck   . History of blood transfusion "couple times"    "related to bleeding in colon and esophagus"  . Arthritis     "left shoulder" (10/19/2014)  . Anxiety   . S/P TAVR (transcatheter aortic valve replacement)  11/20/2014    26 mm Edwards Sapien XT transcatheter heart valve placed via transapical approach    Past Surgical History  Procedure Laterality Date  . Iliac artery stent Left 2005    CIA  . Esophagogastroduodenoscopy  2012    normal  . Colonoscopy  July 2015    Dr. Henrene Pastor  . Tonsillectomy    . Knee arthroscopy with medial menisectomy Left 03/08/2014    Procedure: LEFT KNEE SCOPE WITH MEDIAL MENISECTOMY AND CHONDROPLASTY;  Surgeon: Ninetta Lights, MD;  Location: [facility];  Service: Orthopedics;  Laterality: Left;  . Left and right heart catheterization with coronary angiogram N/A 06/28/2014    Procedure: LEFT AND RIGHT HEART CATHETERIZATION WITH CORONARY ANGIOGRAM;  Surgeon: Jerline Pain, MD;  Location: [facility] CATH LAB;  Service: Cardiovascular;  Laterality: N/A;  . Esophagogastroduodenoscopy N/A 08/17/2014    Procedure: ESOPHAGOGASTRODUODENOSCOPY (EGD);  Surgeon: Jerene Bears, MD;  Location: [facility] ENDOSCOPY;  Service: Endoscopy;  Laterality: N/A;  . Colonoscopy N/A 08/17/2014    Procedure: COLONOSCOPY;  Surgeon: Jerene Bears, MD;  Location: [facility] ENDOSCOPY;  Service: Endoscopy;  Laterality: N/A;  . Cardiac catheterization  2001; 06/28/2014  . Cardiac catheterization N/A 10/19/2014    Procedure: Coronary Stent Intervention;  Surgeon: Burnell Blanks, MD;  Location: [facility] CV LAB;  Service: Cardiovascular;  Laterality: N/A;  BMS Mid RCA  . Transcatheter aortic valve replacement, transapical N/A 11/20/2014    Procedure: TRANSCATHETER AORTIC VALVE REPLACEMENT, TRANSAPICAL;  Surgeon: [name], MD;  Location: [facility];  Service: Open Heart Surgery;  Laterality: N/A;  . Tee without cardioversion N/A 11/20/2014    Procedure: TRANSESOPHAGEAL ECHOCARDIOGRAM (TEE);  Surgeon: [name], MD;  Location: [facility];  Service: Open Heart Surgery;  Laterality: N/A;  . Rib plating Left 11/20/2014    Procedure: RIB PLATING OF LEFT 8TH RIB;  Surgeon: [name], MD;  Location: [facility];  Service: Open Heart Surgery;  Laterality: Left;  . Colonoscopy N/A 12/05/2014    Procedure: COLONOSCOPY;  Surgeon: Manus Gunning, MD;  Location: [facility];  Service: Gastroenterology;  Laterality: N/A;  . Enteroscopy N/A 12/05/2014    Procedure: ENTEROSCOPY;  Surgeon: Manus Gunning, MD;  Location: [place];  Service: Gastroenterology;  Laterality: N/A;     Current Outpatient Prescriptions  Medication Sig Dispense Refill  . albuterol (PROVENTIL HFA;VENTOLIN HFA) 108 (90 Base) MCG/ACT inhaler Inhale 2 puffs into the lungs every 4 (four) hours as needed for wheezing or shortness of breath. 1 Inhaler 11  . ALPRAZolam (XANAX) 0.25 MG tablet Take 1 tablet (0.25 mg total) by mouth 2  (two) times daily as needed. for anxiety 45 tablet 0  . Ascorbic Acid (VITAMIN C) 250 MG tablet Take 500 mg by mouth daily.     Marland Kitchen aspirin 81 MG tablet Take 81 mg by mouth daily.    Marland Kitchen atorvastatin (LIPITOR) 40 MG tablet TAKE ONE TABLET BY MOUTH ONCE DAILY 90 tablet 1  . citalopram (CELEXA) 10 MG tablet Take 1 tablet (10 mg total) by mouth daily. 30 tablet 3  . diclofenac sodium (VOLTAREN) 1 % GEL Apply 2 g topically 4 (four) times daily. 100 g 1  . docusate sodium (COLACE) 100 MG capsule Take 100 mg by mouth 2 (two) times daily.     . famotidine (PEPCID) 20 MG tablet Take 20 mg by mouth daily.    . fenofibrate 160 MG tablet Take 1 tablet (160 mg total) by mouth daily. 30 tablet  2  . ferrous sulfate 325 (65 FE) MG EC tablet Take 1 tablet (325 mg total) by mouth 2 (two) times daily. 60 tablet 11  . furosemide (LASIX) 20 MG tablet Take 2 tablets (40 mg total) by mouth daily. 60 tablet 3  . Glycopyrrolate-Formoterol (BEVESPI AEROSPHERE) 9-4.8 MCG/ACT AERO Inhale 2 puffs into the lungs 2 (two) times daily. 1 Inhaler 11  . metoprolol tartrate (LOPRESSOR) 25 MG tablet Take 0.5 tablets (12.5 mg total) by mouth 2 (two) times daily. 60 tablet 6  . Multiple Vitamins-Minerals (CENTRUM SILVER PO) Take 1 tablet by mouth daily.     . OXYGEN Inhale 2 mLs into the lungs daily.     . pantoprazole (PROTONIX) 40 MG tablet Take 1 tablet (40 mg total) by mouth daily.    . potassium chloride SA (K-DUR,KLOR-CON) 20 MEQ tablet Take 1 tablet (20 mEq total) by mouth daily. 30 tablet 3  . temazepam (RESTORIL) 30 MG capsule Take 1 capsule (30 mg total) by mouth at bedtime as needed. for sleep 30 capsule 0  . valsartan (DIOVAN) 80 MG tablet Take 1 tablet (80 mg total) by mouth daily. 30 tablet 5   No current facility-administered medications for this visit.    Allergies:   Review of patient's allergies indicates no known allergies.    Social History:  The patient  reports that he quit smoking about 5 weeks ago. His  smoking use included Cigarettes and E-cigarettes. He has a 60 pack-year smoking history. He has never used smokeless tobacco. He reports that he drinks about 8.4 oz of alcohol per week. He reports that he does not use illicit drugs.   Family History:  The patient's family history includes CAD in his daughter; Colitis in his father; Heart disease in his brother; Hyperlipidemia in his brother; Hypertension in his brother and son; Lung disease in his mother. There is no history of Colon cancer.    ROS:  Please see the history of present illness.   Otherwise, review of systems are positive for none.   All other systems are reviewed and negative.    PHYSICAL EXAM: VS:  BP 130/50 mmHg  Pulse 73  Ht 5' 2.5" (1.588 m)  Wt 136 lb 3.2 oz (61.78 kg)  BMI 24.50 kg/m2  SpO2 94% , BMI Body mass index is 24.5 kg/(m^2). GEN: Well nourished, well developed, in no acute distress HEENT: normal Neck: no JVD, carotid bruits, or masses Cardiac: RRR; 2/6 S murmur, rubs, or gallops,no edema  Respiratory:  decreased air movement bilaterally, blunted bases no wheezes  GI: soft, nontender, nondistended, + BS MS: no deformity or atrophy Skin: warm and dry, no rash Neuro:  Strength and sensation are intact Psych: euthymic mood, full affect   EKG:  None today   Recent Labs: 06/01/2014: TSH 1.206 12/07/2014: Magnesium 1.8 05/19/2015: ALT 38 05/21/2015: B Natriuretic Peptide 355.2*; BUN 14; Creatinine, Ser 0.78; Hemoglobin 14.0; Platelets 179; Potassium 4.2; Sodium 142    Lipid Panel    Component Value Date/Time   CHOL 212* 05/09/2015 0951   TRIG 270.0* 05/09/2015 0951   TRIG 102 02/26/2006 1044   HDL 60.80 05/09/2015 0951   CHOLHDL 3 05/09/2015 0951   CHOLHDL 2.8 CALC 02/26/2006 1044   VLDL 54.0* 05/09/2015 0951   LDLCALC 80 12/17/2014 1229   LDLDIRECT 114.0 05/09/2015 0951      Wt Readings from Last 3 Encounters:  05/27/15 136 lb 3.2 oz (61.78 kg)  05/24/15 124 lb 12.8 oz (56.609 kg)  05/23/15  135 lb 9.6 oz (61.508 kg)      Other studies Reviewed: Additional studies/ records that were reviewed today include: Prior lab work reviewed. Review of the above records demonstrates: As above   ASSESSMENT AND PLAN:  Aortic stenosis  - TAVR  - Still feeling shortness of breath.   - underlying COPD as well.  - Excellent result  - walking  COPD  - Dr. [name]  - Home oxygen  CAD   - RCA stenting  - Currently on aspirin only  - GI bleed  GI bleed/AVM  - Aspirin only  - Hemoglobin 13.4 on 01/29/15 up from 11.5  Paroxysmal atrial fibrillation postoperatively  - Off of amiodarone now. Doing well.  Lasix has been decreased 40 mg, continue with low-dose potassium supplementation.  Hyperlipidemia  - Continuing with atorvastatin 40 mg  - I will continue his fenofibrate, triglycerides were 270 at last check.  - Dr. Etter Sjogren restarted fibrate.     Current medicines are reviewed at length with the patient today.  The patient does not have concerns regarding medicines.  The following changes have been made:  no change  Labs/ tests ordered today include: as above  No orders of the defined types were placed in this encounter.     Disposition:   FU with [place] 6 months Signed, Candee Furbish, MD  05/27/2015 1:32 PM    [facility] [place], [place], [place]  [address] Phone: [phone]; Fax: [phone]

## 2015-05-27 NOTE — Patient Instructions (Signed)

## 2015-05-31 ENCOUNTER — Other Ambulatory Visit: Payer: Self-pay | Admitting: Family Medicine

## 2015-06-08 ENCOUNTER — Other Ambulatory Visit: Payer: Self-pay | Admitting: Cardiology

## 2015-06-12 ENCOUNTER — Telehealth: Payer: Self-pay | Admitting: Family Medicine

## 2015-06-12 NOTE — Telephone Encounter (Signed)
Caller name: Manus Gunning with [facility] Can be reached: [phone]   Reason for call: Pt was referred for Palliative Care and needs call with VO. Ok to leave on confidential VM.

## 2015-06-13 NOTE — Telephone Encounter (Signed)
Spoke with Manus Gunning and gave Verbal order per pcp for palliative care at this time.

## 2015-06-13 NOTE — Telephone Encounter (Signed)
Please advise      KP 

## 2015-06-13 NOTE — Telephone Encounter (Signed)
Ok to give verbal 

## 2015-06-14 ENCOUNTER — Other Ambulatory Visit: Payer: Self-pay | Admitting: Family Medicine

## 2015-06-14 DIAGNOSIS — F17210 Nicotine dependence, cigarettes, uncomplicated: Secondary | ICD-10-CM

## 2015-06-14 DIAGNOSIS — R5383 Other fatigue: Secondary | ICD-10-CM

## 2015-06-14 DIAGNOSIS — J441 Chronic obstructive pulmonary disease with (acute) exacerbation: Secondary | ICD-10-CM

## 2015-06-17 ENCOUNTER — Other Ambulatory Visit: Payer: Self-pay | Admitting: Family Medicine

## 2015-06-17 NOTE — Telephone Encounter (Signed)
Last seen 05/23/15 and filled 05/31/15 #45   Please advise    KP

## 2015-06-20 ENCOUNTER — Telehealth: Payer: Self-pay | Admitting: Family Medicine

## 2015-06-20 MED ORDER — ALPRAZOLAM 0.5 MG PO TABS: 0.5000 mg | ORAL_TABLET | Freq: Three times a day (TID) | ORAL | Status: DC | PRN

## 2015-06-20 NOTE — Telephone Encounter (Signed)
Change to xanax 0.5 mg 1 po tid prn

## 2015-06-20 NOTE — Telephone Encounter (Signed)
Patient aware Rx is going to be increased and  faxed and he verbalized understanding, Rx faxed.

## 2015-06-20 NOTE — Telephone Encounter (Signed)
Please advise      KP 

## 2015-06-20 NOTE — Telephone Encounter (Signed)
Caller name: Clarice Relationship to patient: wife Can be reached: [phone] Pharmacy: [facility] [place], [place]  Reason for call: Clarice said that pt is not doing right. He's not sleeping. He is needing something stronger for nerves. She said that he got a low dose and not even enough for 2/day.

## 2015-07-01 ENCOUNTER — Other Ambulatory Visit: Payer: Self-pay | Admitting: Family Medicine

## 2015-07-01 NOTE — Telephone Encounter (Signed)
Last seen and filled 05/23/15 #30   Please advise   KP

## 2015-07-08 ENCOUNTER — Ambulatory Visit: Payer: Medicare Other | Admitting: Internal Medicine

## 2015-07-11 ENCOUNTER — Ambulatory Visit: Payer: Medicare Other | Admitting: Internal Medicine

## 2015-07-16 ENCOUNTER — Ambulatory Visit: Payer: Medicare Other | Admitting: Internal Medicine

## 2015-07-16 ENCOUNTER — Other Ambulatory Visit: Payer: Self-pay | Admitting: Family Medicine

## 2015-07-16 NOTE — Telephone Encounter (Signed)
Pt's spouse Clarice called in to make PCP aware that pt's medications are missing. She said that they had company in the home, she thinks that someone may have taken them. She would like to be advised further on what they should do?     CB : [phone]

## 2015-07-16 NOTE — Telephone Encounter (Signed)
Last seen 05/23/15 and filled 07/01/15 #30 no refills   Please advise     KP

## 2015-07-17 ENCOUNTER — Telehealth: Payer: Self-pay | Admitting: *Deleted

## 2015-07-17 NOTE — Telephone Encounter (Signed)
Forwarded to Dr. Lowne-Chase. JG//CMA 

## 2015-07-19 DIAGNOSIS — R069 Unspecified abnormalities of breathing: Secondary | ICD-10-CM | POA: Diagnosis not present

## 2015-07-20 ENCOUNTER — Inpatient Hospital Stay (HOSPITAL_COMMUNITY)
Admission: EM | Admit: 2015-07-20 | Discharge: 2015-07-22 | DRG: 190 | Disposition: A | Discharge status: Home Health | Payer: Medicare Other | Attending: Internal Medicine | Admitting: Internal Medicine

## 2015-07-20 ENCOUNTER — Encounter (HOSPITAL_COMMUNITY): Payer: Self-pay

## 2015-07-20 ENCOUNTER — Emergency Department (HOSPITAL_COMMUNITY): Payer: Medicare Other

## 2015-07-20 DIAGNOSIS — F329 Major depressive disorder, single episode, unspecified: Secondary | ICD-10-CM | POA: Diagnosis present

## 2015-07-20 DIAGNOSIS — J9622 Acute and chronic respiratory failure with hypercapnia: Secondary | ICD-10-CM | POA: Diagnosis present

## 2015-07-20 DIAGNOSIS — I480 Paroxysmal atrial fibrillation: Secondary | ICD-10-CM | POA: Diagnosis present

## 2015-07-20 DIAGNOSIS — T1490 Injury, unspecified: Secondary | ICD-10-CM | POA: Diagnosis not present

## 2015-07-20 DIAGNOSIS — D509 Iron deficiency anemia, unspecified: Secondary | ICD-10-CM | POA: Diagnosis present

## 2015-07-20 DIAGNOSIS — D539 Nutritional anemia, unspecified: Secondary | ICD-10-CM | POA: Diagnosis present

## 2015-07-20 DIAGNOSIS — I110 Hypertensive heart disease with heart failure: Secondary | ICD-10-CM | POA: Diagnosis present

## 2015-07-20 DIAGNOSIS — J449 Chronic obstructive pulmonary disease, unspecified: Secondary | ICD-10-CM | POA: Diagnosis present

## 2015-07-20 DIAGNOSIS — J9612 Chronic respiratory failure with hypercapnia: Secondary | ICD-10-CM

## 2015-07-20 DIAGNOSIS — S299XXA Unspecified injury of thorax, initial encounter: Secondary | ICD-10-CM | POA: Diagnosis not present

## 2015-07-20 DIAGNOSIS — I2510 Atherosclerotic heart disease of native coronary artery without angina pectoris: Secondary | ICD-10-CM | POA: Diagnosis not present

## 2015-07-20 DIAGNOSIS — E785 Hyperlipidemia, unspecified: Secondary | ICD-10-CM | POA: Diagnosis present

## 2015-07-20 DIAGNOSIS — Z9981 Dependence on supplemental oxygen: Secondary | ICD-10-CM

## 2015-07-20 DIAGNOSIS — D500 Iron deficiency anemia secondary to blood loss (chronic): Secondary | ICD-10-CM | POA: Diagnosis present

## 2015-07-20 DIAGNOSIS — J441 Chronic obstructive pulmonary disease with (acute) exacerbation: Secondary | ICD-10-CM | POA: Diagnosis not present

## 2015-07-20 DIAGNOSIS — J9611 Chronic respiratory failure with hypoxia: Secondary | ICD-10-CM | POA: Diagnosis present

## 2015-07-20 DIAGNOSIS — I5033 Acute on chronic diastolic (congestive) heart failure: Secondary | ICD-10-CM | POA: Diagnosis present

## 2015-07-20 DIAGNOSIS — F411 Generalized anxiety disorder: Secondary | ICD-10-CM | POA: Diagnosis present

## 2015-07-20 DIAGNOSIS — Z955 Presence of coronary angioplasty implant and graft: Secondary | ICD-10-CM | POA: Diagnosis not present

## 2015-07-20 DIAGNOSIS — R069 Unspecified abnormalities of breathing: Secondary | ICD-10-CM | POA: Diagnosis not present

## 2015-07-20 DIAGNOSIS — I959 Hypotension, unspecified: Secondary | ICD-10-CM | POA: Diagnosis present

## 2015-07-20 DIAGNOSIS — R0600 Dyspnea, unspecified: Secondary | ICD-10-CM

## 2015-07-20 DIAGNOSIS — J9621 Acute and chronic respiratory failure with hypoxia: Secondary | ICD-10-CM | POA: Diagnosis present

## 2015-07-20 DIAGNOSIS — Z79899 Other long term (current) drug therapy: Secondary | ICD-10-CM | POA: Diagnosis not present

## 2015-07-20 DIAGNOSIS — Z7289 Other problems related to lifestyle: Secondary | ICD-10-CM | POA: Diagnosis not present

## 2015-07-20 DIAGNOSIS — Z7982 Long term (current) use of aspirin: Secondary | ICD-10-CM | POA: Diagnosis not present

## 2015-07-20 DIAGNOSIS — I10 Essential (primary) hypertension: Secondary | ICD-10-CM | POA: Diagnosis present

## 2015-07-20 DIAGNOSIS — N179 Acute kidney failure, unspecified: Secondary | ICD-10-CM | POA: Diagnosis not present

## 2015-07-20 DIAGNOSIS — Z87891 Personal history of nicotine dependence: Secondary | ICD-10-CM | POA: Diagnosis not present

## 2015-07-20 DIAGNOSIS — R0602 Shortness of breath: Secondary | ICD-10-CM | POA: Diagnosis not present

## 2015-07-20 DIAGNOSIS — I739 Peripheral vascular disease, unspecified: Secondary | ICD-10-CM | POA: Diagnosis present

## 2015-07-20 DIAGNOSIS — I5032 Chronic diastolic (congestive) heart failure: Secondary | ICD-10-CM

## 2015-07-20 DIAGNOSIS — Z8249 Family history of ischemic heart disease and other diseases of the circulatory system: Secondary | ICD-10-CM | POA: Diagnosis not present

## 2015-07-20 LAB — CBC
HCT: 35.9 % — ABNORMAL LOW (ref 39.0–52.0)
Hemoglobin: 11.5 g/dL — ABNORMAL LOW (ref 13.0–17.0)
MCH: 32.8 pg (ref 26.0–34.0)
MCHC: 32 g/dL (ref 30.0–36.0)
MCV: 102.3 fL — AB (ref 78.0–100.0)
PLATELETS: 208 10*3/uL (ref 150–400)
RBC: 3.51 MIL/uL — AB (ref 4.22–5.81)
RDW: 13 % (ref 11.5–15.5)
WBC: 10.5 10*3/uL (ref 4.0–10.5)

## 2015-07-20 LAB — BASIC METABOLIC PANEL
Anion gap: 15 (ref 5–15)
BUN: 33 mg/dL — AB (ref 6–20)
CALCIUM: 9.5 mg/dL (ref 8.9–10.3)
CHLORIDE: 104 mmol/L (ref 101–111)
CO2: 21 mmol/L — ABNORMAL LOW (ref 22–32)
CREATININE: 2.13 mg/dL — AB (ref 0.61–1.24)
GFR, EST AFRICAN AMERICAN: 33 mL/min — AB (ref >60)
GFR, EST NON AFRICAN AMERICAN: 28 mL/min — AB (ref >60)
Glucose, Bld: 158 mg/dL — ABNORMAL HIGH (ref 65–99)
Potassium: 4 mmol/L (ref 3.5–5.1)
SODIUM: 140 mmol/L (ref 135–145)

## 2015-07-20 LAB — I-STAT TROPONIN, ED: TROPONIN I, POC: 0.02 ng/mL (ref 0.00–0.08)

## 2015-07-20 LAB — BRAIN NATRIURETIC PEPTIDE: B Natriuretic Peptide: 64.8 pg/mL (ref 0.0–100.0)

## 2015-07-20 LAB — VITAMIN B12: VITAMIN B 12: 382 pg/mL (ref 180–914)

## 2015-07-20 LAB — PROCALCITONIN: Procalcitonin: <0.1 ng/mL

## 2015-07-20 MED ORDER — VITAMIN C 500 MG PO TABS
500.0000 mg | ORAL_TABLET | Freq: Every day | ORAL | Status: DC
  Administered 2015-07-21: 500 mg via ORAL
  Filled 2015-07-20: qty 1

## 2015-07-20 MED ORDER — ATORVASTATIN CALCIUM 40 MG PO TABS
40.0000 mg | ORAL_TABLET | Freq: Every day | ORAL | Status: DC
  Administered 2015-07-21 – 2015-07-22 (×2): 40 mg via ORAL
  Filled 2015-07-20 (×2): qty 1

## 2015-07-20 MED ORDER — POLYETHYLENE GLYCOL 3350 17 G PO PACK: 17.0000 g | PACK | Freq: Every day | ORAL | Status: DC | PRN

## 2015-07-20 MED ORDER — METOPROLOL TARTRATE 12.5 MG HALF TABLET
12.5000 mg | ORAL_TABLET | Freq: Two times a day (BID) | ORAL | Status: DC
  Filled 2015-07-20: qty 1

## 2015-07-20 MED ORDER — ALPRAZOLAM 0.5 MG PO TABS
0.5000 mg | ORAL_TABLET | Freq: Three times a day (TID) | ORAL | Status: DC | PRN
  Administered 2015-07-20: 0.5 mg via ORAL
  Filled 2015-07-20: qty 1

## 2015-07-20 MED ORDER — HEPARIN SODIUM (PORCINE) 5000 UNIT/ML IJ SOLN
5000.0000 [IU] | Freq: Three times a day (TID) | INTRAMUSCULAR | Status: DC
  Administered 2015-07-20 – 2015-07-22 (×5): 5000 [IU] via SUBCUTANEOUS
  Filled 2015-07-20 (×5): qty 1

## 2015-07-20 MED ORDER — ASPIRIN EC 81 MG PO TBEC
81.0000 mg | DELAYED_RELEASE_TABLET | Freq: Every day | ORAL | Status: DC
  Administered 2015-07-21 – 2015-07-22 (×2): 81 mg via ORAL
  Filled 2015-07-20 (×2): qty 1

## 2015-07-20 MED ORDER — TEMAZEPAM 15 MG PO CAPS: 30.0000 mg | ORAL_CAPSULE | Freq: Every evening | ORAL | Status: DC | PRN

## 2015-07-20 MED ORDER — SODIUM CHLORIDE 0.9 % IV SOLN
Freq: Once | INTRAVENOUS | Status: AC
  Administered 2015-07-20: 19:00:00 via INTRAVENOUS

## 2015-07-20 MED ORDER — IPRATROPIUM-ALBUTEROL 0.5-2.5 (3) MG/3ML IN SOLN: 3.0000 mL | RESPIRATORY_TRACT | Status: DC | PRN

## 2015-07-20 MED ORDER — METHYLPREDNISOLONE SODIUM SUCC 125 MG IJ SOLR: 125.0000 mg | Freq: Once | INTRAMUSCULAR | Status: DC

## 2015-07-20 MED ORDER — ONDANSETRON HCL 4 MG/2ML IJ SOLN: 4.0000 mg | Freq: Four times a day (QID) | INTRAMUSCULAR | Status: DC | PRN

## 2015-07-20 MED ORDER — SODIUM CHLORIDE 0.9 % IV SOLN
INTRAVENOUS | Status: AC
  Administered 2015-07-20 – 2015-07-21 (×2): via INTRAVENOUS

## 2015-07-20 MED ORDER — METHYLPREDNISOLONE SODIUM SUCC 125 MG IJ SOLR
60.0000 mg | Freq: Four times a day (QID) | INTRAMUSCULAR | Status: DC
  Administered 2015-07-20 – 2015-07-21 (×2): 60 mg via INTRAVENOUS
  Filled 2015-07-20 (×2): qty 2

## 2015-07-20 MED ORDER — SODIUM CHLORIDE 0.9% FLUSH
3.0000 mL | Freq: Two times a day (BID) | INTRAVENOUS | Status: DC
  Administered 2015-07-21: 3 mL via INTRAVENOUS

## 2015-07-20 MED ORDER — ALBUTEROL SULFATE (2.5 MG/3ML) 0.083% IN NEBU
2.5000 mg | INHALATION_SOLUTION | RESPIRATORY_TRACT | Status: DC
  Filled 2015-07-20: qty 3

## 2015-07-20 MED ORDER — ALBUTEROL (5 MG/ML) CONTINUOUS INHALATION SOLN
10.0000 mg/h | INHALATION_SOLUTION | Freq: Once | RESPIRATORY_TRACT | Status: AC
  Administered 2015-07-20: 10 mg/h via RESPIRATORY_TRACT
  Filled 2015-07-20: qty 20

## 2015-07-20 MED ORDER — ARFORMOTEROL TARTRATE 15 MCG/2ML IN NEBU
15.0000 ug | INHALATION_SOLUTION | Freq: Two times a day (BID) | RESPIRATORY_TRACT | Status: DC
  Administered 2015-07-21 – 2015-07-22 (×3): 15 ug via RESPIRATORY_TRACT
  Filled 2015-07-20 (×3): qty 2

## 2015-07-20 MED ORDER — ONDANSETRON HCL 4 MG PO TABS: 4.0000 mg | ORAL_TABLET | Freq: Four times a day (QID) | ORAL | Status: DC | PRN

## 2015-07-20 MED ORDER — BISACODYL 5 MG PO TBEC: 5.0000 mg | DELAYED_RELEASE_TABLET | Freq: Every day | ORAL | Status: DC | PRN

## 2015-07-20 MED ORDER — FAMOTIDINE 20 MG PO TABS
20.0000 mg | ORAL_TABLET | Freq: Every day | ORAL | Status: DC
  Administered 2015-07-20 – 2015-07-21 (×2): 20 mg via ORAL
  Filled 2015-07-20 (×2): qty 1

## 2015-07-20 MED ORDER — DOCUSATE SODIUM 100 MG PO CAPS
100.0000 mg | ORAL_CAPSULE | Freq: Two times a day (BID) | ORAL | Status: DC
  Administered 2015-07-21 – 2015-07-22 (×2): 100 mg via ORAL
  Filled 2015-07-20 (×3): qty 1

## 2015-07-20 MED ORDER — HYDROCODONE-ACETAMINOPHEN 5-325 MG PO TABS: 1.0000 | ORAL_TABLET | ORAL | Status: DC | PRN

## 2015-07-20 MED ORDER — FENOFIBRATE 160 MG PO TABS
160.0000 mg | ORAL_TABLET | Freq: Every day | ORAL | Status: DC
  Administered 2015-07-21 – 2015-07-22 (×2): 160 mg via ORAL
  Filled 2015-07-20 (×2): qty 1

## 2015-07-20 MED ORDER — CITALOPRAM HYDROBROMIDE 10 MG PO TABS
10.0000 mg | ORAL_TABLET | Freq: Every day | ORAL | Status: DC
Start: 2015-07-20 — End: 2015-07-22
  Administered 2015-07-21 – 2015-07-22 (×2): 10 mg via ORAL
  Filled 2015-07-20 (×2): qty 1

## 2015-07-20 MED ORDER — UMECLIDINIUM BROMIDE 62.5 MCG/INH IN AEPB
1.0000 | INHALATION_SPRAY | Freq: Every day | RESPIRATORY_TRACT | Status: DC
  Administered 2015-07-21: 1 via RESPIRATORY_TRACT
  Filled 2015-07-20: qty 7

## 2015-07-20 MED ORDER — ACETAMINOPHEN 325 MG PO TABS: 650.0000 mg | ORAL_TABLET | Freq: Four times a day (QID) | ORAL | Status: DC | PRN

## 2015-07-20 MED ORDER — FERROUS SULFATE 325 (65 FE) MG PO TABS
325.0000 mg | ORAL_TABLET | Freq: Two times a day (BID) | ORAL | Status: DC
  Administered 2015-07-20 – 2015-07-22 (×4): 325 mg via ORAL
  Filled 2015-07-20 (×4): qty 1

## 2015-07-20 MED ORDER — ACETAMINOPHEN 650 MG RE SUPP: 650.0000 mg | Freq: Four times a day (QID) | RECTAL | Status: DC | PRN

## 2015-07-20 NOTE — ED Notes (Signed)
Resp tech in with pt to give continuous neb

## 2015-07-20 NOTE — H&P (Signed)
History and Physical    Ronald Bell [id number] DOB: 1940/04/17 DOA: 07/20/2015  Referring Provider: Dr. Reather Converse ([place])   PCP: Ann Held, DO  Outpatient Specialists: Dr. Marlou Porch (cardiology), Dr. [name] (pulmonology), Dr. Henrene Pastor (GI)  Patient coming from: Home  Chief Complaint: Acute on chronic dyspnea   HPI: Ronald Bell is a 75 y.o. male with medical history significant for paroxysmal atrial fibrillation, COPD on home O2 at night, CAD with stent, chronic diastolic CHF, hypertension, anxiety, and depression who presents to the ED with acute on chronic dyspnea. Patient reports dyspnea increased beyond his baseline for the past 1-2 weeks. He has been using his albuterol MDI and nebulizer with increasing frequency, and while this improves his symptoms momentarily, he remains significantly short of breath with minimal exertion. He denies any recent fever, chills, chest pain, or palpitations. He does endorse chronic orthopnea and sleeps with his head elevated. There has been no unilateral leg swelling or tenderness, no recent long distance travel, and no sick contacts. Patient slipped and fell earlier this morning but there was no significant injury, he did not hit his head or lose consciousness, and he is not anticoagulated secondary to recurrent GI bleeds in the past. Later in the day, as his dyspnea was worsening, EMS was called out to his house for evaluation. He was noted to be significantly short of breath and received 125 mg IV Solu-Medrol and 2 nebulized breathing treatments en route to the hospital.  ED Course: Upon arrival to the ED, patient is found to be afebrile, saturating adequately on 3 L/m supplemental oxygen, with soft blood pressure, and heart rate of approximately 100. EKG featured a sinus tachycardia with rate 106 and anterior Q waves. Chest x-ray was negative for acute cardiopulmonary disease. Troponin is 0.02. BMP is notable for serum creatinine of 2.13, up  from an apparent baseline of 0.8. CBC is notable for hemoglobin of 11.5 and MCV of 102. Patient received a continuous nebulized treatment in the emergency department with some temporary relief. He'll be admitted to the hospital for ongoing evaluation and management of COPD with acute exacerbation.  Review of Systems:  All other systems reviewed and apart from HPI, are negative.  Past Medical History  Diagnosis Date  . Polyp of nasal cavity   . Rosacea   . Hx of adenomatous colonic polyps 2012, 2013.   . Tobacco abuse   . Hypertension   . Hyperlipidemia   . GERD (gastroesophageal reflux disease)   . GI bleed 2010    4 units PRBCs  . Peripheral vascular disease ([place])   . Irregular heartbeat   . Shingles   . Carotid artery occlusion   . Angiodysplasia of intestine with hemorrhage     large and SB, gastric AVMs.   . Pleural plaque with presence of asbestos 03/27/2013    Followed in Pulmonary clinic/ [facility]/ [place] - F/u CT 09/08/2013 1. Stable extensive calcified pleural plaque formation consistent with asbestos exposure. 2. Multiple pulmonary nodules are unchanged from the CT of 6 months ago. Given risk factors for lung cancer, continued follow up is recommended with chest CT in 6 months> done 04/20/14 no change >repeat in 12 m in tickle file     . GERD 11/30/2008    Qualifier: Diagnosis of  By: Marijean Niemann CMA, Danielle    . PVD (peripheral vascular disease) ([place]) 10/18/2012  . Carotid artery stenosis 04/22/2012  . Coronary artery disease   . COPD GOLD III with min  reversibilty  08/11/2006    Followed in Pulmonary clinic/ [facility]/ [place] - PFT's 04/28/2013  FEV1 0.88 (40%) with ratio 44 and 14% better p B2 dlco 45 corrects to 83 - Trial of breo 04/28/2013 > improved symptoms  06/09/2013  - spirometry 06/04/2014 FEV1  0.76 (29%) ratio 45      . Iron deficiency anemia 01/25/2009    Qualifier: Diagnosis of  By: Henrene Pastor MD, Docia Chuck   . History of blood transfusion "couple times"    "related  to bleeding in colon and esophagus"  . Arthritis     "left shoulder" (10/19/2014)  . Anxiety   . 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 Iliac artery stent Left 2005    CIA  . Esophagogastroduodenoscopy  2012    normal  . Colonoscopy  July 2015    Dr. Henrene Pastor  . Tonsillectomy    . Knee arthroscopy with medial menisectomy Left 03/08/2014    Procedure: LEFT KNEE SCOPE WITH MEDIAL MENISECTOMY AND CHONDROPLASTY;  Surgeon: Ninetta Lights, MD;  Location: [facility];  Service: Orthopedics;  Laterality: Left;  . Left and right heart catheterization with coronary angiogram N/A 06/28/2014    Procedure: LEFT AND RIGHT HEART CATHETERIZATION WITH CORONARY ANGIOGRAM;  Surgeon: Jerline Pain, MD;  Location: [facility] CATH LAB;  Service: Cardiovascular;  Laterality: N/A;  . Esophagogastroduodenoscopy N/A 08/17/2014    Procedure: ESOPHAGOGASTRODUODENOSCOPY (EGD);  Surgeon: Jerene Bears, MD;  Location: [facility] ENDOSCOPY;  Service: Endoscopy;  Laterality: N/A;  . Colonoscopy N/A 08/17/2014    Procedure: COLONOSCOPY;  Surgeon: Jerene Bears, MD;  Location: [facility] ENDOSCOPY;  Service: Endoscopy;  Laterality: N/A;  . Cardiac catheterization  2001; 06/28/2014  . Cardiac catheterization N/A 10/19/2014    Procedure: Coronary Stent Intervention;  Surgeon: Burnell Blanks, MD;  Location: [place] CV LAB;  Service: Cardiovascular;  Laterality: N/A;  BMS Mid RCA  . Transcatheter aortic valve replacement, transapical N/A 11/20/2014    Procedure: TRANSCATHETER AORTIC VALVE REPLACEMENT, TRANSAPICAL;  Surgeon: [name], MD;  Location: [place];  Service: Open Heart Surgery;  Laterality: N/A;  . Tee without cardioversion N/A 11/20/2014    Procedure: TRANSESOPHAGEAL ECHOCARDIOGRAM (TEE);  Surgeon: [name], MD;  Location: [place];  Service: Open Heart Surgery;  Laterality:  N/A;  . Rib plating Left 11/20/2014    Procedure: RIB PLATING OF LEFT 8TH RIB;  Surgeon: [name], MD;  Location: [place];  Service: Open Heart Surgery;  Laterality: Left;  . Colonoscopy N/A 12/05/2014    Procedure: COLONOSCOPY;  Surgeon: Manus Gunning, MD;  Location: [place];  Service: Gastroenterology;  Laterality: N/A;  . Enteroscopy N/A 12/05/2014    Procedure: ENTEROSCOPY;  Surgeon: Manus Gunning, MD;  Location: [place];  Service: Gastroenterology;  Laterality: N/A;     reports that he quit smoking about 3 months ago. His smoking use included Cigarettes and E-cigarettes. He has a 60 pack-year smoking history. He has never used smokeless tobacco. He reports that he drinks about 8.4 oz of alcohol per week. He reports that he does not use illicit drugs.  No Known Allergies  Family History  Problem Relation Age of Onset  . Lung disease Mother     pulm fibrosis  . Colitis Father   . Colon cancer Neg Hx   . Heart  disease Brother   . Hypertension Brother   . Hyperlipidemia Brother   . CAD Daughter     cad  . Hypertension Son      Prior to Admission medications   Medication Sig Start Date End Date Taking? Authorizing Provider  albuterol (PROVENTIL HFA;VENTOLIN HFA) 108 (90 Base) MCG/ACT inhaler Inhale 2 puffs into the lungs every 4 (four) hours as needed for wheezing or shortness of breath. 05/23/15  Yes Yvonne R Lowne Chase, DO  ALPRAZolam (XANAX) 0.5 MG tablet Take 1 tablet (0.5 mg total) by mouth 3 (three) times daily as needed for anxiety. 06/20/15  Yes Yvonne R Lowne Chase, DO  Ascorbic Acid (VITAMIN C) 250 MG tablet Take 500 mg by mouth daily.    Yes Historical Provider, MD  aspirin 81 MG tablet Take 81 mg by mouth daily.   Yes Historical Provider, MD  atorvastatin (LIPITOR) 40 MG tablet TAKE ONE TABLET BY MOUTH ONCE DAILY 02/12/15  Yes Yvonne R Lowne Chase, DO  citalopram (CELEXA) 10 MG tablet Take 1 tablet (10 mg total) by mouth daily. 05/09/15  Yes  Yvonne R Lowne Chase, DO  diclofenac sodium (VOLTAREN) 1 % GEL Apply 2 g topically 4 (four) times daily. Patient taking differently: Apply 2 g topically 4 (four) times daily as needed (for pain).  04/25/15  Yes Yvonne R Lowne Chase, DO  docusate sodium (COLACE) 100 MG capsule Take 100 mg by mouth 2 (two) times daily.    Yes Historical Provider, MD  famotidine (PEPCID) 20 MG tablet Take 20 mg by mouth at bedtime.    Yes Historical Provider, MD  fenofibrate 160 MG tablet Take 1 tablet (160 mg total) by mouth daily. 05/15/15  Yes Alferd Apa Lowne Chase, DO  ferrous sulfate 325 (65 FE) MG EC tablet Take 1 tablet (325 mg total) by mouth 2 (two) times daily. 05/09/15  Yes Yvonne R Lowne Chase, DO  furosemide (LASIX) 20 MG tablet Take 2 tablets (40 mg total) by mouth daily. 05/09/15  Yes Yvonne R Lowne Chase, DO  Glycopyrrolate-Formoterol (BEVESPI AEROSPHERE) 9-4.8 MCG/ACT AERO Inhale 2 puffs into the lungs 2 (two) times daily. 05/24/15  Yes Tanda Rockers, MD  metoprolol tartrate (LOPRESSOR) 25 MG tablet Take 0.5 tablets (12.5 mg total) by mouth 2 (two) times daily. 05/09/15  Yes Yvonne R Lowne Chase, DO  Multiple Vitamins-Minerals (CENTRUM SILVER PO) Take 1 tablet by mouth daily.    Yes Historical Provider, MD  OXYGEN Inhale 2 L into the lungs at bedtime.    Yes Historical Provider, MD  potassium chloride SA (K-DUR,KLOR-CON) 20 MEQ tablet Take 1 tablet (20 mEq total) by mouth daily. 05/09/15  Yes Yvonne R Lowne Chase, DO  temazepam (RESTORIL) 30 MG capsule TAKE ONE CAPSULE BY MOUTH AT BEDTIME AS NEEDED FOR SLEEP Patient taking differently: TAKE ONE CAPSULE BY MOUTH AT BEDTIME FOR SLEEP 07/16/15  Yes Yvonne R Lowne Chase, DO  valsartan (DIOVAN) 80 MG tablet Take 1 tablet (80 mg total) by mouth daily. 05/09/15  Yes Yvonne R Lowne Chase, DO  furosemide (LASIX) 20 MG tablet TAKE TWO TABLETS BY MOUTH ONCE DAILY 06/11/15   Jerline Pain, MD  pantoprazole (PROTONIX) 40 MG tablet Take 1 tablet (40 mg total) by mouth daily.  05/09/15   Ann Held, DO    Physical Exam: Filed Vitals:   07/20/15 1821 07/20/15 1822 07/20/15 1836 07/20/15 1900  BP:  92/51 104/49 110/97  Pulse:   98 101  Temp:  TempSrc:      Resp:   26 23  Height:      Weight:      SpO2: 98%  99% 100%      Constitutional: Moderate respiratory distress with accessory muscle recruitment Eyes: PERTLA, lids and conjunctivae normal ENMT: Mucous membranes are moist. Posterior pharynx clear of any exudate or lesions.   Neck: normal, supple, no masses, no thyromegaly Respiratory: Diminished bilaterally, occasional end-expiratory wheeze.  Cardiovascular: S1 & S2 heard, regular rate and rhythm, grade III systolic murmur at USB, no rubs / gallops. No extremity edema.   Abdomen: No distension, no tenderness, no masses palpated. No hepatosplenomegaly. Bowel sounds normal.  Musculoskeletal: no clubbing / cyanosis. No joint deformity upper and lower extremities. Normal muscle tone.  Skin: no significant rashes, lesions, ulcers. No induration Neurologic: CN 2-12 grossly intact. Sensation intact, DTR normal. Strength 5/5 in all 4 limbs.  Psychiatric: Normal judgment and insight. Alert and oriented x 3. Normal mood.     Labs on Admission: I have personally reviewed following labs and imaging studies  CBC:  Recent Labs Lab 07/20/15 1811  WBC 10.5  HGB 11.5*  HCT 35.9*  MCV 102.3*  PLT 123XX123   Basic Metabolic Panel:  Recent Labs Lab 07/20/15 1811  NA 140  K 4.0  CL 104  CO2 21*  GLUCOSE 158*  BUN 33*  CREATININE 2.13*  CALCIUM 9.5   GFR: Estimated Creatinine Clearance: 22.8 mL/min (by C-G formula based on Cr of 2.13). Liver Function Tests: No results for input(s): AST, ALT, ALKPHOS, BILITOT, PROT, ALBUMIN in the last 168 hours. No results for input(s): LIPASE, AMYLASE in the last 168 hours. No results for input(s): AMMONIA in the last 168 hours. Coagulation Profile: No results for input(s): INR, PROTIME in the last 168  hours. Cardiac Enzymes: No results for input(s): CKTOTAL, CKMB, CKMBINDEX, TROPONINI in the last 168 hours. BNP (last 3 results) No results for input(s): PROBNP in the last 8760 hours. HbA1C: No results for input(s): HGBA1C in the last 72 hours. CBG: No results for input(s): GLUCAP in the last 168 hours. Lipid Profile: No results for input(s): CHOL, HDL, LDLCALC, TRIG, CHOLHDL, LDLDIRECT in the last 72 hours. Thyroid Function Tests: No results for input(s): TSH, T4TOTAL, FREET4, T3FREE, THYROIDAB in the last 72 hours. Anemia Panel: No results for input(s): VITAMINB12, FOLATE, FERRITIN, TIBC, IRON, RETICCTPCT in the last 72 hours. Urine analysis:    Component Value Date/Time   COLORURINE YELLOW 11/16/2014 1058   APPEARANCEUR CLEAR 11/16/2014 1058   LABSPEC 1.015 11/16/2014 1058   PHURINE 6.5 11/16/2014 1058   GLUCOSEU NEGATIVE 11/16/2014 1058   HGBUR NEGATIVE 11/16/2014 1058   HGBUR negative 11/30/2008 0000   BILIRUBINUR neg 05/09/2015 1106   BILIRUBINUR NEGATIVE 11/16/2014 1058   KETONESUR NEGATIVE 11/16/2014 1058   PROTEINUR neg 05/09/2015 1106   PROTEINUR NEGATIVE 11/16/2014 1058   UROBILINOGEN 0.2 05/09/2015 1106   UROBILINOGEN 0.2 11/16/2014 1058   NITRITE neg 05/09/2015 1106   NITRITE NEGATIVE 11/16/2014 1058   LEUKOCYTESUR Negative 05/09/2015 1106   Sepsis Labs: @LABRCNTIP (procalcitonin:4,lacticidven:4) )No results found for this or any previous visit (from the past 240 hour(s)).   Radiological Exams on Admission: Dg Chest 2 View  07/20/2015  CLINICAL DATA:  Fall at home. EXAM: CHEST  2 VIEW COMPARISON:  May 21, 2015. FINDINGS: Stable cardiomediastinal silhouette. Status post aortic valve replacement. Stable bilateral calcified pleural plaques are noted. Atherosclerosis of abdominal aorta is noted. No pneumothorax or pleural effusion is noted. Stable  left midlung scarring is noted. No acute pulmonary disease is noted. Atherosclerosis of thoracic aorta is noted.  IMPRESSION: Stable calcified pleural plaques. Stable left midlung scarring. No acute cardiopulmonary abnormality seen. Electronically Signed   By: Marijo Conception, M.D.   On: 07/20/2015 18:27    EKG: Independently reviewed. Sinus tachycardia (rate 106), anterior Q-wave  Assessment/Plan  1. COPD with chronic hypercapnic respiratory failure and acute exacerbation - Uncertain precipitant, no evidence of infection identified  - Still with increased WOB and increased O2 requirement after continuous neb and systemic steroid  - Continue DuoNeb q2-3h prn  - Continue systemic steroid with Solu-Medrol 60 mg IV q6h  - Continuous pulse oximetry with titration of FiO2 to maintain sat in 90s  - Continue home inhalers with formulary equivalent (Bevespi Aeroshpere replaced with Brovana and Incruse)  2. CAD - Cath in July 2016 revealed 95% stenosis of mid RCA and BMS placed  - No anginal complaints  - EKG without acute ischemic features, troponin wnl  - Monitor on telemetry  - Continue ASA 81, Lipitor   3. Paroxysmal atrial fibrillation - In sinus rhythm on admission  - CHADS-VASc is 40 (age x2, CHF, CAD, HTN)  - Not on AC due to recurrent GIB  - Monitor on telemetry   4. Chronic diastolic CHF  - TTE (0000000) with EF 123456, grade 1 diastolic dysfunction  - Appears dry on presentation, has AKI  - Hold Lasix for now while gently hydrating as below  - Follow strict I/Os, daily wts    5. Hypertension - Running on the low-side currently  - Hold Lasix and valsartan until BP can tolerate and renal fxn stable   6. Iron-deficiency anemia  - Hgb 11.4 on admission with MCV 102.3  - Continue iron suplementation  - Check B12 and folate levels given new macrocytosis, supplement prn  - No sign of active blood loss   7. Depression, anxiety  - Appears stable  - Continue current-dose Celexa, Xanax    8. AKI  - SCr 2.13 on admission, up from 0.78 on 05/21/2015  - Suspect this is multifactorial with  prerenal azotemia secondary to poor PO intake and continued Lasix and valsartan use  - Gently hydrating overnight with NS at 75 cc/hr  - Hold Lasix and valsartan until renal fxn stable  - Repeat chem panel in am    DVT prophylaxis: sq heparin  Code Status: Full  Family Communication: None available  Disposition Plan: Admit to telemetry   Consults called: None  Admission status: Inpatient    Vianne Bulls MD [facility] Pager [phone]  If 7PM-7AM, please contact night-coverage [email or web address] Password [facility]  07/20/2015, 8:04 PM

## 2015-07-20 NOTE — ED Notes (Signed)
Pt given decaf coffee and ice water

## 2015-07-20 NOTE — ED Provider Notes (Signed)
CSN: [id number]     Arrival date & time 07/20/15  1653 History   First MD Initiated Contact with Patient 07/20/15 1708     Chief Complaint  Patient presents with  . Shortness of Breath     (Consider location/radiation/quality/duration/timing/severity/associated sxs/prior Treatment) HPI Comments: 75 year old male with history of anemia, COPD on 2 L Amoxil that time, vascular disease, aortic valve replacement, aortic stenosis, see that smoker presents with worsening short of breath and cough. EMS was called her this morning and then later on this evening for similar worsening breathing difficulty. Patient had low risk fall slipping on the floor in his slippers no significant pain or injury from the fall. No loss of consciousness. Patient denies blood clot history, recent surgery, leg swelling.   This feels similar to COPD history.  Patient is a 75 y.o. male presenting with shortness of breath. The history is provided by the patient.  Shortness of Breath Associated symptoms: cough and wheezing   Associated symptoms: no abdominal pain, no chest pain, no fever, no headaches, no neck pain, no rash and no vomiting     Past Medical History  Diagnosis Date  . Polyp of nasal cavity   . Rosacea   . Hx of adenomatous colonic polyps 2012, 2013.   . Tobacco abuse   . Hypertension   . Hyperlipidemia   . GERD (gastroesophageal reflux disease)   . GI bleed 2010    4 units PRBCs  . Peripheral vascular disease ([place])   . Irregular heartbeat   . Shingles   . Carotid artery occlusion   . Angiodysplasia of intestine with hemorrhage     large and SB, gastric AVMs.   . Pleural plaque with presence of asbestos 03/27/2013    Followed in Pulmonary clinic/ [facility]/ [place] - F/u CT 09/08/2013 1. Stable extensive calcified pleural plaque formation consistent with asbestos exposure. 2. Multiple pulmonary nodules are unchanged from the CT of 6 months ago. Given risk factors for lung cancer, continued  follow up is recommended with chest CT in 6 months> done 04/20/14 no change >repeat in 12 m in tickle file     . GERD 11/30/2008    Qualifier: Diagnosis of  By: Marijean Niemann CMA, Danielle    . PVD (peripheral vascular disease) ([place]) 10/18/2012  . Carotid artery stenosis 04/22/2012  . Coronary artery disease   . COPD GOLD III with min reversibilty  08/11/2006    Followed in Pulmonary clinic/ [facility]/ [place] - PFT's 04/28/2013  FEV1 0.88 (40%) with ratio 44 and 14% better p B2 dlco 45 corrects to 83 - Trial of breo 04/28/2013 > improved symptoms  06/09/2013  - spirometry 06/04/2014 FEV1  0.76 (29%) ratio 45      . Iron deficiency anemia 01/25/2009    Qualifier: Diagnosis of  By: Henrene Pastor MD, Docia Chuck   . History of blood transfusion "couple times"    "related to bleeding in colon and esophagus"  . Arthritis     "left shoulder" (10/19/2014)  . Anxiety   . 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 Iliac artery stent Left 2005    CIA  . Esophagogastroduodenoscopy  2012    normal  . Colonoscopy  July 2015    Dr. Henrene Pastor  . Tonsillectomy    . Knee arthroscopy with medial menisectomy Left 03/08/2014  Procedure: LEFT KNEE SCOPE WITH MEDIAL MENISECTOMY AND CHONDROPLASTY;  Surgeon: Ninetta Lights, MD;  Location: [place];  Service: Orthopedics;  Laterality: Left;  . Left and right heart catheterization with coronary angiogram N/A 06/28/2014    Procedure: LEFT AND RIGHT HEART CATHETERIZATION WITH CORONARY ANGIOGRAM;  Surgeon: Jerline Pain, MD;  Location: [facility] CATH LAB;  Service: Cardiovascular;  Laterality: N/A;  . Esophagogastroduodenoscopy N/A 08/17/2014    Procedure: ESOPHAGOGASTRODUODENOSCOPY (EGD);  Surgeon: Jerene Bears, MD;  Location: [facility] ENDOSCOPY;  Service: Endoscopy;  Laterality: N/A;  . Colonoscopy N/A 08/17/2014    Procedure: COLONOSCOPY;  Surgeon:  Jerene Bears, MD;  Location: [facility] ENDOSCOPY;  Service: Endoscopy;  Laterality: N/A;  . Cardiac catheterization  2001; 06/28/2014  . Cardiac catheterization N/A 10/19/2014    Procedure: Coronary Stent Intervention;  Surgeon: Burnell Blanks, MD;  Location: [place] CV LAB;  Service: Cardiovascular;  Laterality: N/A;  BMS Mid RCA  . Transcatheter aortic valve replacement, transapical N/A 11/20/2014    Procedure: TRANSCATHETER AORTIC VALVE REPLACEMENT, TRANSAPICAL;  Surgeon: [name], MD;  Location: [place];  Service: Open Heart Surgery;  Laterality: N/A;  . Tee without cardioversion N/A 11/20/2014    Procedure: TRANSESOPHAGEAL ECHOCARDIOGRAM (TEE);  Surgeon: [name], MD;  Location: [place];  Service: Open Heart Surgery;  Laterality: N/A;  . Rib plating Left 11/20/2014    Procedure: RIB PLATING OF LEFT 8TH RIB;  Surgeon: [name], MD;  Location: [place];  Service: Open Heart Surgery;  Laterality: Left;  . Colonoscopy N/A 12/05/2014    Procedure: COLONOSCOPY;  Surgeon: Manus Gunning, MD;  Location: [place];  Service: Gastroenterology;  Laterality: N/A;  . Enteroscopy N/A 12/05/2014    Procedure: ENTEROSCOPY;  Surgeon: Manus Gunning, MD;  Location: [place];  Service: Gastroenterology;  Laterality: N/A;   Family History  Problem Relation Age of Onset  . Lung disease Mother     pulm fibrosis  . Colitis Father   . Colon cancer Neg Hx   . Heart disease Brother   . Hypertension Brother   . Hyperlipidemia Brother   . CAD Daughter     cad  . Hypertension Son    Social History  Substance Use Topics  . Smoking status: Former Smoker -- 1.00 packs/day for 60 years    Types: Cigarettes, E-cigarettes    Quit date: 04/18/2015  . Smokeless tobacco: Never Used     Comment: Patient is using the patch  . Alcohol Use: 8.4 oz/week    14 Glasses of wine per week    Review of Systems  Constitutional: Negative for fever and chills.  HENT: Negative for  congestion.   Eyes: Negative for visual disturbance.  Respiratory: Positive for cough, shortness of breath and wheezing.   Cardiovascular: Negative for chest pain.  Gastrointestinal: Negative for vomiting and abdominal pain.  Genitourinary: Negative for dysuria and flank pain.  Musculoskeletal: Negative for back pain, neck pain and neck stiffness.  Skin: Negative for rash.  Neurological: Negative for light-headedness and headaches.      Allergies  Review of patient's allergies indicates no known allergies.  Home Medications   Prior to Admission medications   Medication Sig Start Date End Date Taking? Authorizing Provider  albuterol (PROVENTIL HFA;VENTOLIN HFA) 108 (90 Base) MCG/ACT inhaler Inhale 2 puffs into the lungs every 4 (four) hours as needed for wheezing or shortness of breath. 05/23/15  Yes Yvonne R Lowne Chase, DO  ALPRAZolam Duanne Moron) 0.5 MG  tablet Take 1 tablet (0.5 mg total) by mouth 3 (three) times daily as needed for anxiety. 06/20/15  Yes Yvonne R Lowne Chase, DO  Ascorbic Acid (VITAMIN C) 250 MG tablet Take 500 mg by mouth daily.    Yes Historical Provider, MD  aspirin 81 MG tablet Take 81 mg by mouth daily.   Yes Historical Provider, MD  atorvastatin (LIPITOR) 40 MG tablet TAKE ONE TABLET BY MOUTH ONCE DAILY 02/12/15  Yes Yvonne R Lowne Chase, DO  citalopram (CELEXA) 10 MG tablet Take 1 tablet (10 mg total) by mouth daily. 05/09/15  Yes Yvonne R Lowne Chase, DO  diclofenac sodium (VOLTAREN) 1 % GEL Apply 2 g topically 4 (four) times daily. Patient taking differently: Apply 2 g topically 4 (four) times daily as needed (for pain).  04/25/15  Yes Yvonne R Lowne Chase, DO  docusate sodium (COLACE) 100 MG capsule Take 100 mg by mouth 2 (two) times daily.    Yes Historical Provider, MD  famotidine (PEPCID) 20 MG tablet Take 20 mg by mouth at bedtime.    Yes Historical Provider, MD  fenofibrate 160 MG tablet Take 1 tablet (160 mg total) by mouth daily. 05/15/15  Yes Alferd Apa Lowne  Chase, DO  ferrous sulfate 325 (65 FE) MG EC tablet Take 1 tablet (325 mg total) by mouth 2 (two) times daily. 05/09/15  Yes Yvonne R Lowne Chase, DO  furosemide (LASIX) 20 MG tablet Take 2 tablets (40 mg total) by mouth daily. 05/09/15  Yes Yvonne R Lowne Chase, DO  Glycopyrrolate-Formoterol (BEVESPI AEROSPHERE) 9-4.8 MCG/ACT AERO Inhale 2 puffs into the lungs 2 (two) times daily. 05/24/15  Yes Tanda Rockers, MD  metoprolol tartrate (LOPRESSOR) 25 MG tablet Take 0.5 tablets (12.5 mg total) by mouth 2 (two) times daily. 05/09/15  Yes Yvonne R Lowne Chase, DO  Multiple Vitamins-Minerals (CENTRUM SILVER PO) Take 1 tablet by mouth daily.    Yes Historical Provider, MD  OXYGEN Inhale 2 L into the lungs at bedtime.    Yes Historical Provider, MD  potassium chloride SA (K-DUR,KLOR-CON) 20 MEQ tablet Take 1 tablet (20 mEq total) by mouth daily. 05/09/15  Yes Yvonne R Lowne Chase, DO  temazepam (RESTORIL) 30 MG capsule TAKE ONE CAPSULE BY MOUTH AT BEDTIME AS NEEDED FOR SLEEP Patient taking differently: TAKE ONE CAPSULE BY MOUTH AT BEDTIME FOR SLEEP 07/16/15  Yes Yvonne R Lowne Chase, DO  valsartan (DIOVAN) 80 MG tablet Take 1 tablet (80 mg total) by mouth daily. 05/09/15  Yes Yvonne R Lowne Chase, DO  furosemide (LASIX) 20 MG tablet TAKE TWO TABLETS BY MOUTH ONCE DAILY 06/11/15   Jerline Pain, MD  pantoprazole (PROTONIX) 40 MG tablet Take 1 tablet (40 mg total) by mouth daily. 05/09/15   Yvonne R Lowne Chase, DO   BP 104/49 mmHg  Pulse 98  Temp(Src) 98.4 F (36.9 C) (Oral)  Resp 26  Ht 5\' 2"  (1.575 m)  Wt 129 lb (58.514 kg)  BMI 23.59 kg/m2  SpO2 99% Physical Exam  Constitutional: He is oriented to person, place, and time. He appears well-developed and well-nourished.  HENT:  Head: Normocephalic and atraumatic.  Eyes: Right eye exhibits no discharge. Left eye exhibits no discharge.  Neck: Normal range of motion. Neck supple. No tracheal deviation present.  Cardiovascular: Regular rhythm.  Tachycardia  present.   Pulmonary/Chest: He has wheezes (end expiratory, decreased breath sounds bilateral, increased respiratory effort).  Abdominal: Soft. He exhibits no distension. There is no tenderness. There is  no guarding.  Musculoskeletal: He exhibits no edema.  Neurological: He is alert and oriented to person, place, and time.  Skin: Skin is warm. No rash noted.  Psychiatric: He has a normal mood and affect.  Nursing note and vitals reviewed.   ED Course  Procedures (including critical care time) Labs Review Labs Reviewed  BASIC METABOLIC PANEL - Abnormal; Notable for the following:    CO2 21 (*)    Glucose, Bld 158 (*)    BUN 33 (*)    Creatinine, Ser 2.13 (*)    GFR calc non Af Amer 28 (*)    GFR calc Af Amer 33 (*)    All other components within normal limits  CBC - Abnormal; Notable for the following:    RBC 3.51 (*)    Hemoglobin 11.5 (*)    HCT 35.9 (*)    MCV 102.3 (*)    All other components within normal limits  Randolm Idol, ED    Imaging Review Dg Chest 2 View  07/20/2015  CLINICAL DATA:  Fall at home. EXAM: CHEST  2 VIEW COMPARISON:  May 21, 2015. FINDINGS: Stable cardiomediastinal silhouette. Status post aortic valve replacement. Stable bilateral calcified pleural plaques are noted. Atherosclerosis of abdominal aorta is noted. No pneumothorax or pleural effusion is noted. Stable left midlung scarring is noted. No acute pulmonary disease is noted. Atherosclerosis of thoracic aorta is noted. IMPRESSION: Stable calcified pleural plaques. Stable left midlung scarring. No acute cardiopulmonary abnormality seen. Electronically Signed   By: Marijo Conception, M.D.   On: 07/20/2015 18:27   I have personally reviewed and evaluated these images and lab results as part of my medical decision-making.   EKG Interpretation   Date/Time:  Saturday July 20 2015 16:56:11 EDT Ventricular Rate:  106 PR Interval:  168 QRS Duration: 102 QT Interval:  350 QTC Calculation:  465 R Axis:   84 Text Interpretation:  Sinus tachycardia Anterior infarct, old Nonspecific  T abnormalities, lateral leads Confirmed by Eara Burruel  MD, Denielle Bayard ([id number]) on  07/20/2015 5:10:59 PM      MDM   Final diagnoses:  Acute exacerbation of chronic obstructive pulmonary disease (COPD) ([place])  Acute dyspnea  Acute renal failure, unspecified acute renal failure type [facility])   Patient presents with clinical concern for COPD exacerbation. Patient has increased effort breathing, patient required continuous neb in the ER. His steroids prior to arrival. Patient requiring increased oxygen than his baseline and has worsening symptoms/dyspnea. With to EMS visits today and worsening symptoms clinically plan for admission/observation the hospital. We'll discuss with [facility] hospitalist.  The patients results and plan were reviewed and discussed.   Any x-rays performed were independently reviewed by myself.   Differential diagnosis were considered with the presenting HPI.  Medications  albuterol (PROVENTIL) (2.5 MG/3ML) 0.083% nebulizer solution 2.5 mg (not administered)  albuterol (PROVENTIL,VENTOLIN) solution continuous neb (10 mg/hr Nebulization Given 07/20/15 1821)  0.9 %  sodium chloride infusion ( Intravenous New Bag/Given 07/20/15 1837)    Filed Vitals:   07/20/15 1746 07/20/15 1821 07/20/15 1822 07/20/15 1836  BP: 94/70  92/51 104/49  Pulse: 95   98  Temp:      TempSrc:      Resp: 23   26  Height:      Weight:      SpO2: 96% 98%  99%    Final diagnoses:  Acute exacerbation of chronic obstructive pulmonary disease (COPD) (H[facility])  Acute dyspnea  Acute renal failure, unspecified acute renal  failure type [facility])    Admission/ observation were discussed with the admitting physician, patient and/or family and they are comfortable with the plan.     Elnora Morrison, MD 07/20/15 1901

## 2015-07-20 NOTE — ED Notes (Signed)
Opyd, MD at bedside 

## 2015-07-20 NOTE — ED Notes (Signed)
Per EMS, called out for a fall. Pt states that his slippers slipped on the floor and the pt sat down on the floor, denied hitting head or loc. SCCA passed with EMS. Pt was escorted to chair. Pt stood up on his own to get his personal belongings and became sob with EMS prompting transport. Pt states that he has been using his rescue inhaler 1-3 times per day and uses o2 at night at 3L. Pt has copd. SPO2 at 97% on RA with EMS. Wheezing in all fields. EMS gave nebulizer 5mg (twice) albuterol and 0.5 of atrovent with 125mg  of soludmedrol. Pt has 20ga in right hand. BP 143/118, HR 112 sinus tach, RR 26. Pt alert and oriented x 4.

## 2015-07-21 DIAGNOSIS — J441 Chronic obstructive pulmonary disease with (acute) exacerbation: Secondary | ICD-10-CM | POA: Insufficient documentation

## 2015-07-21 DIAGNOSIS — N179 Acute kidney failure, unspecified: Secondary | ICD-10-CM | POA: Insufficient documentation

## 2015-07-21 LAB — BASIC METABOLIC PANEL
Anion gap: 9 (ref 5–15)
BUN: 31 mg/dL — ABNORMAL HIGH (ref 6–20)
CALCIUM: 9.3 mg/dL (ref 8.9–10.3)
CO2: 26 mmol/L (ref 22–32)
CREATININE: 1.68 mg/dL — AB (ref 0.61–1.24)
Chloride: 105 mmol/L (ref 101–111)
GFR calc non Af Amer: 38 mL/min — ABNORMAL LOW (ref >60)
GFR, EST AFRICAN AMERICAN: 44 mL/min — AB (ref >60)
Glucose, Bld: 185 mg/dL — ABNORMAL HIGH (ref 65–99)
Potassium: 4.4 mmol/L (ref 3.5–5.1)
SODIUM: 140 mmol/L (ref 135–145)

## 2015-07-21 MED ORDER — PREDNISONE 50 MG PO TABS
60.0000 mg | ORAL_TABLET | Freq: Once | ORAL | Status: AC
  Administered 2015-07-21: 60 mg via ORAL
  Filled 2015-07-21: qty 1

## 2015-07-21 MED ORDER — LORAZEPAM 2 MG/ML IJ SOLN: 1.0000 mg | Freq: Four times a day (QID) | INTRAMUSCULAR | Status: DC | PRN

## 2015-07-21 MED ORDER — THIAMINE HCL 100 MG/ML IJ SOLN
100.0000 mg | Freq: Every day | INTRAMUSCULAR | Status: DC
  Filled 2015-07-21: qty 2

## 2015-07-21 MED ORDER — IPRATROPIUM-ALBUTEROL 0.5-2.5 (3) MG/3ML IN SOLN
3.0000 mL | RESPIRATORY_TRACT | Status: DC
  Administered 2015-07-21 (×2): 3 mL via RESPIRATORY_TRACT
  Filled 2015-07-21 (×2): qty 3

## 2015-07-21 MED ORDER — BUDESONIDE 0.25 MG/2ML IN SUSP
0.2500 mg | Freq: Two times a day (BID) | RESPIRATORY_TRACT | Status: DC
  Administered 2015-07-21 – 2015-07-22 (×3): 0.25 mg via RESPIRATORY_TRACT
  Filled 2015-07-21 (×3): qty 2

## 2015-07-21 MED ORDER — LORAZEPAM 1 MG PO TABS: 1.0000 mg | ORAL_TABLET | Freq: Four times a day (QID) | ORAL | Status: DC | PRN

## 2015-07-21 MED ORDER — FOLIC ACID 1 MG PO TABS
1.0000 mg | ORAL_TABLET | Freq: Every day | ORAL | Status: DC
  Administered 2015-07-21 – 2015-07-22 (×2): 1 mg via ORAL
  Filled 2015-07-21 (×2): qty 1

## 2015-07-21 MED ORDER — SODIUM CHLORIDE 0.9 % IV SOLN
INTRAVENOUS | Status: DC
  Administered 2015-07-21: 13:00:00 via INTRAVENOUS

## 2015-07-21 MED ORDER — ADULT MULTIVITAMIN W/MINERALS CH
1.0000 | ORAL_TABLET | Freq: Every day | ORAL | Status: DC
  Administered 2015-07-21 – 2015-07-22 (×2): 1 via ORAL
  Filled 2015-07-21 (×2): qty 1

## 2015-07-21 MED ORDER — VITAMIN B-1 100 MG PO TABS
100.0000 mg | ORAL_TABLET | Freq: Every day | ORAL | Status: DC
  Administered 2015-07-21 – 2015-07-22 (×2): 100 mg via ORAL
  Filled 2015-07-21 (×2): qty 1

## 2015-07-21 MED ORDER — PREDNISONE 20 MG PO TABS
40.0000 mg | ORAL_TABLET | Freq: Every day | ORAL | Status: DC
  Administered 2015-07-22: 40 mg via ORAL
  Filled 2015-07-21: qty 2

## 2015-07-21 MED ORDER — DOXYCYCLINE HYCLATE 100 MG PO TABS
100.0000 mg | ORAL_TABLET | Freq: Two times a day (BID) | ORAL | Status: DC
  Administered 2015-07-21 – 2015-07-22 (×3): 100 mg via ORAL
  Filled 2015-07-21 (×3): qty 1

## 2015-07-21 MED ORDER — ALPRAZOLAM 0.5 MG PO TABS
0.5000 mg | ORAL_TABLET | Freq: Two times a day (BID) | ORAL | Status: DC
  Administered 2015-07-21 – 2015-07-22 (×3): 0.5 mg via ORAL
  Filled 2015-07-21 (×3): qty 1

## 2015-07-21 MED ORDER — IPRATROPIUM-ALBUTEROL 0.5-2.5 (3) MG/3ML IN SOLN
3.0000 mL | Freq: Four times a day (QID) | RESPIRATORY_TRACT | Status: DC
  Administered 2015-07-21 – 2015-07-22 (×3): 3 mL via RESPIRATORY_TRACT
  Filled 2015-07-21 (×3): qty 3

## 2015-07-21 NOTE — Progress Notes (Addendum)
0956 bp low as taken and charted

## 2015-07-21 NOTE — Progress Notes (Addendum)
[facility]                                                                              Patient Demographics  Ronald Bell, is a 75 y.o. male, DOB - [birth year], [id number]  Admit date - 07/20/2015   Admitting Physician Vianne Bulls, MD  Outpatient Primary MD for the patient is Ann Held, DO  Outpatient specialists: Dr. Marlou Porch (cardiology),                                           Dr. [name] (pulmonology)                                          Dr. Henrene Pastor (GI)  LOS - 1  days    Chief Complaint  Patient presents with  . Shortness of Breath       Brief summary   Patient is a 75 year old male with paroxysmal atrial fibrillation, COPD on home O2 2 L at night, CAD, chronic diastolic CHF, hypertension, anxiety, depression presented to ED with acute on chronic dyspnea with wheezing. Chest x-ray negative for acute pneumonia. Troponin 0.02, creatinine 2.13, baseline 0.8, hemoglobin 11.5, MCV 102.  Assessment & Plan   Acute on chronic hypoxic and hypercapnic respiratory failure due to COPD exacerbation  - Still wheezing, continue scheduled nebs, Brovana, Pulmicort, doxycycline. - tremulous, could be due to alcohol withdrawal versus IV steroids - DC IV steroids, placed on oral prednisone  Alcohol use: Patient drinks a glass of wine everyday -Very tremulous on examination could be due to IV steroids however rule out alcohol withdrawal - I have placed patient on xanax Twice a day, he takes daily  - place on CIWA protocol with Ativan   CAD - Cath in July 2016 revealed 95% stenosis of mid RCA and BMS placed  - EKG without acute ischemic features, troponin wnl  - Continue ASA 81, Lipitor   Paroxysmal atrial fibrillation - In sinus rhythm on admission  - CHADS-VASc is 23 (age x2, CHF, CAD, HTN), Not on AC due to recurrent GIB  -Currently holding beta blocker secondary to hypotension    Chronic diastolic CHF  - TTE (0000000) with EF  123456, grade 1 diastolic dysfunction  - Appears dry on presentation, has AKI  - Hold Lasix for now while gently hydrating as below  - Follow strict I/Os, daily wts    hypotension with history of hypertension -  hold Lasix, valsartan, hold beta blocker  - Gently hydrate    Iron-deficiency anemia, macrocytic secondary to alcohol use  - Hgb 11.4 on admission with MCV 102.3  - B12 normal, folate pending   Depression, anxiety  Continue Celexa, Xanax    AKI  - SCr 2.13 on admission, up from 0.78 on 05/21/2015  - Suspect this is multifactorial with prerenal azotemia secondary to poor PO intake and continued Lasix and valsartan use, hold Lasix and valsartan  until renal fxn stable  - Creatinine improving  Code Status: Full CODE STATUS   Family Communication: Discussed in detail with the patient, all imaging results, lab results explained to the patient   Disposition Plan:   Time Spent in minutes   25 minutes  Procedures  None   Consults   None   DVT Prophylaxis  heparin   Medications  Scheduled Meds: . ALPRAZolam  0.5 mg Oral BID  . arformoterol  15 mcg Nebulization BID  . aspirin EC  81 mg Oral Daily  . atorvastatin  40 mg Oral Daily  . citalopram  10 mg Oral Daily  . docusate sodium  100 mg Oral BID  . famotidine  20 mg Oral QHS  . fenofibrate  160 mg Oral Daily  . ferrous sulfate  325 mg Oral BID  . folic acid  1 mg Oral Daily  . heparin  5,000 Units Subcutaneous Q8H  . ipratropium-albuterol  3 mL Nebulization Q4H  . multivitamin with minerals  1 tablet Oral Daily  . [START ON 07/22/2015] predniSONE  40 mg Oral Q breakfast  . sodium chloride flush  3 mL Intravenous Q12H  . thiamine  100 mg Oral Daily   Or  . thiamine  100 mg Intravenous Daily  . umeclidinium bromide  1 puff Inhalation Daily  . vitamin C  500 mg Oral Daily   Continuous Infusions: . sodium chloride     PRN Meds:.acetaminophen **OR** acetaminophen, bisacodyl, HYDROcodone-acetaminophen,  ipratropium-albuterol, LORazepam **OR** LORazepam, ondansetron **OR** ondansetron (ZOFRAN) IV, polyethylene glycol, temazepam   Antibiotics   Anti-infectives    None        Subjective:   Ronald Bell was seen and examined today.  Patient denies dizziness, chest pain, abdominal pain, N/V/D/C, new weakness, numbess, tingling. No acute events overnight.  +Shortness of breath with wheezing   Objective:   Filed Vitals:   07/21/15 0434 07/21/15 0725 07/21/15 0900 07/21/15 0956  BP: 108/62   85/56  Pulse: 94   89  Temp: 98.3 F (36.8 C)     TempSrc: Oral     Resp: 17   18  Height:      Weight: 58.182 kg (128 lb 4.3 oz)     SpO2: 100% 99% 99% 99%    Intake/Output Summary (Last 24 hours) at 07/21/15 1112 Last data filed at 07/21/15 1032  Gross per 24 hour  Intake   1505 ml  Output    675 ml  Net    830 ml     Wt Readings from Last 3 Encounters:  07/21/15 58.182 kg (128 lb 4.3 oz)  05/27/15 61.78 kg (136 lb 3.2 oz)  05/24/15 56.609 kg (124 lb 12.8 oz)     Exam  General: Alert and oriented x 3, NAD  HEENT:  PERRLA, EOMI, Anicteric Sclera, mucous membranes moist.   Neck: Supple, no JVD, no masses  CVS: S1 S2 auscultated, no rubs, murmurs or gallops. Regular rate and rhythm.  Respiratory: Diffuse expiratory wheezing bilaterally  Abdomen: Soft, nontender, nondistended, + bowel sounds  Ext: no cyanosis clubbing or edema  Neuro: AAOx3, Cr N's II- XII. Strength 5/5 upper and lower extremities bilaterally  Skin: No rashes  Psych: Normal affect and demeanor, alert and oriented x3    Data Reviewed:  I have personally reviewed following labs and imaging studies  Micro Results No results found for this or any previous visit (from the past 240 hour(s)).  Radiology Reports Dg Chest 2 View  07/20/2015  CLINICAL DATA:  Fall at home. EXAM: CHEST  2 VIEW COMPARISON:  May 21, 2015. FINDINGS: Stable cardiomediastinal silhouette. Status post aortic valve  replacement. Stable bilateral calcified pleural plaques are noted. Atherosclerosis of abdominal aorta is noted. No pneumothorax or pleural effusion is noted. Stable left midlung scarring is noted. No acute pulmonary disease is noted. Atherosclerosis of thoracic aorta is noted. IMPRESSION: Stable calcified pleural plaques. Stable left midlung scarring. No acute cardiopulmonary abnormality seen. Electronically Signed   By: Marijo Conception, M.D.   On: 07/20/2015 18:27    CBC  Recent Labs Lab 07/20/15 1811  WBC 10.5  HGB 11.5*  HCT 35.9*  PLT 208  MCV 102.3*  MCH 32.8  MCHC 32.0  RDW 13.0    Chemistries   Recent Labs Lab 07/20/15 1811 07/21/15 0317  NA 140 140  K 4.0 4.4  CL 104 105  CO2 21* 26  GLUCOSE 158* 185*  BUN 33* 31*  CREATININE 2.13* 1.68*  CALCIUM 9.5 9.3   ------------------------------------------------------------------------------------------------------------------ estimated creatinine clearance is 28.9 mL/min (by C-G formula based on Cr of 1.68). ------------------------------------------------------------------------------------------------------------------ No results for input(s): HGBA1C in the last 72 hours. ------------------------------------------------------------------------------------------------------------------ No results for input(s): CHOL, HDL, LDLCALC, TRIG, CHOLHDL, LDLDIRECT in the last 72 hours. ------------------------------------------------------------------------------------------------------------------ No results for input(s): TSH, T4TOTAL, T3FREE, THYROIDAB in the last 72 hours.  Invalid input(s): FREET3 ------------------------------------------------------------------------------------------------------------------  Recent Labs  07/20/15 2045  VITAMINB12 382    Coagulation profile No results for input(s): INR, PROTIME in the last 168 hours.  No results for input(s): DDIMER in the last 72 hours.  Cardiac Enzymes No  results for input(s): CKMB, TROPONINI, MYOGLOBIN in the last 168 hours.  Invalid input(s): CK ------------------------------------------------------------------------------------------------------------------ Invalid input(s): POCBNP  No results for input(s): GLUCAP in the last 72 hours.   RAI,RIPUDEEP M.D. [facility] 07/21/2015, 11:12 AM  Pager: [phone] Between 7am to 7pm - call Pager - [phone]  After 7pm go to [email or web address] - password [email or web address]  Call night coverage person covering after 7pm

## 2015-07-21 NOTE — Progress Notes (Signed)
1220 with order IV   done

## 2015-07-21 NOTE — Evaluation (Signed)
Occupational Therapy Evaluation Patient Details Name: Ronald Bell MRN: [id number] DOB: 1940-12-27 Today's Date: 07/21/2015    History of Present Illness Ronald Bell is a 75 y.o. male with medical history significant for paroxysmal atrial fibrillation, COPD on home O2 at night, CAD with stent, chronic diastolic CHF, hypertension, anxiety, and depression who presents to the ED with acute on chronic dyspnea. Patient reports dyspnea increased beyond his baseline for the past 1-2 weeks. He has been using his albuterol MDI and nebulizer with increasing frequency, and while this improves his symptoms momentarily, he remains significantly short of breath with minimal exertion.Ronald Bell is a 75 y.o. male with medical history significant for paroxysmal atrial fibrillation, COPD on home O2 at night, CAD with stent, chronic diastolic CHF, hypertension, anxiety, and depression who presents to the ED with acute on chronic dyspnea. Patient reports dyspnea increased beyond his baseline for the past 1-2 weeks. He has been using his albuterol MDI and nebulizer with increasing frequency, and while this improves his symptoms momentarily, he remains significantly short of breath with minimal exertion.Ronald Bell is a 75 y.o. male with medical history significant for paroxysmal atrial fibrillation, COPD on home O2 at night, CAD with stent, chronic diastolic CHF, hypertension, anxiety, and depression who presents to the ED with acute on chronic dyspnea. Patient reports dyspnea increased beyond his baseline for the past 1-2 weeks. He has been using his albuterol MDI and nebulizer with increasing frequency, and while this improves his symptoms momentarily, he remains significantly short of breath with minimal exertion.Ronald Bell is a 75 y.o. male with medical history significant for paroxysmal atrial fibrillation, COPD on home O2 at night, CAD with stent, chronic diastolic CHF,  hypertension, anxiety, and depression who presents to the ED with acute on chronic dyspnea. Patient reports dyspnea increased beyond his baseline for the past 1-2 weeks. He has been using his albuterol MDI and nebulizer with increasing frequency, and while this improves his symptoms momentarily, he remains significantly short of breath with minimal exertion.Ronald Bell is a 75 y.o. male with medical history significant for paroxysmal atrial fibrillation, COPD on home O2 at night, CAD with stent, chronic diastolic CHF, hypertension, anxiety, depression. PMH: PVD, COPD, L SHLD OA, knee arthroscopy.    Clinical Impression   Pt. Does not have skilled OT needs at this time. Pt. Is at baseline for ADLs. Pt. Is in agreement with no further OT.     Follow Up Recommendations  No OT follow up    Equipment Recommendations  None recommended by OT    Recommendations for Other Services       Precautions / Restrictions Restrictions Weight Bearing Restrictions: No      Mobility Bed Mobility Overal bed mobility: Independent                Transfers Overall transfer level: Modified independent                    Balance                                            ADL Overall ADL's : At baseline  Vision     Perception     Praxis      Pertinent Vitals/Pain Pain Assessment: No/denies pain     Hand Dominance Left   Extremity/Trunk Assessment Upper Extremity Assessment Upper Extremity Assessment: Generalized weakness           Communication Communication Communication: No difficulties   Cognition Arousal/Alertness: Awake/alert Behavior During Therapy: WFL for tasks assessed/performed Overall Cognitive Status: Within Functional Limits for tasks assessed                     General Comments       Exercises       Shoulder Instructions      Home Living  Family/patient expects to be discharged to:: Private residence Living Arrangements: Spouse/significant other Available Help at Discharge: Family;Available 24 hours/day Type of Home: House Home Access: Stairs to enter [employer] of Steps:  (2) Entrance Stairs-Rails: None Home Layout: One level     Bathroom Shower/Tub: Tub/shower unit Shower/tub characteristics: Architectural technologist: Standard     Home Equipment: Building services engineer Comments: Pt. did not use shower seat       Prior Functioning/Environment Level of Independence: Independent             OT Diagnosis:     OT Problem List:     OT Treatment/Interventions:      OT Goals(Current goals can be found in the care plan section) Acute Rehab OT Goals Patient Stated Goal: go home  OT Frequency:     Barriers to D/C:            Co-evaluation              End of Session Equipment Utilized During Treatment: Gait belt  Activity Tolerance: Patient tolerated treatment well Patient left: in chair;with call bell/phone within reach   Time: [id number] OT Time Calculation (min): 30 min Charges:  OT General Charges $OT Visit: 1 Procedure OT Evaluation $OT Eval Low Complexity: 1 Procedure OT Treatments $Self Care/Home Management : 8-22 mins G-Codes:    Havanah Nelms 2015/08/11, 11:14 AM

## 2015-07-21 NOTE — Evaluation (Signed)
Physical Therapy Evaluation Patient Details Name: Ronald Bell MRN: [id number] DOB: 1940-08-20 Today's Date: 07/21/2015   History of Present Illness  Ronald Bell is a 75 y.o. male with medical history significant for paroxysmal atrial fibrillation, COPD on home O2 at night, CAD with stent, chronic diastolic CHF, hypertension, anxiety, and depression who presents to the ED with acute on chronic dyspnea. Patient reports dyspnea increased beyond his baseline for the past 1-2 weeks. He has been using his albuterol MDI and nebulizer with increasing frequency, and while this improves his symptoms momentarily, he remains significantly short of breath with minimal exertion.   Clinical Impression  Pt admitted with above diagnosis. Pt currently with functional limitations due to the deficits listed below (see PT Problem List). [name] presents w/ balance impairments w/ high level activities while ambulating.  SpO2 drops to 89% while on 2L O2 but improves w/ cues for pursed lip breathing. Pt will benefit from skilled PT to increase their independence and safety with mobility to allow discharge to the venue listed below.      Follow Up Recommendations Home health PT;Supervision for mobility/OOB    Equipment Recommendations  None recommended by PT    Recommendations for Other Services       Precautions / Restrictions Precautions Precautions: Fall Precaution Comments: watch O2 Restrictions Weight Bearing Restrictions: No      Mobility  Bed Mobility Overal bed mobility: Independent             General bed mobility comments: Pt sitting in recliner chair upon PT arrival  Transfers Overall transfer level: Needs assistance Equipment used: None Transfers: Sit to/from Stand Sit to Stand: Supervision         General transfer comment: Supervision for safety.  Pt reports mild dizziness that clears after standing ~20 seconds.  Ambulation/Gait Ambulation/Gait  assistance: Min guard Ambulation Distance (Feet): 150 Feet Assistive device: None Gait Pattern/deviations: Step-through pattern;Decreased stride length;Narrow base of support   Gait velocity interpretation: at or above normal speed for age/gender General Gait Details: Instability w/ stepping over object, increasing gait speed, and head turns while ambulating, min guard assist for safety.  SpO2 drops to 89% while on 2L O2 but improves w/ cues for pursed lip breathing.  Stairs            Wheelchair Mobility    Modified Rankin (Stroke Patients Only)       Balance Overall balance assessment: Needs assistance;History of Falls Sitting-balance support: No upper extremity supported;Feet supported Sitting balance-Leahy Scale: Good     Standing balance support: No upper extremity supported;During functional activity Standing balance-Leahy Scale: Fair                   Standardized Balance Assessment Standardized Balance Assessment : Dynamic Gait Index   Dynamic Gait Index Level Surface: Mild Impairment Change in Gait Speed: Mild Impairment Gait with Horizontal Head Turns: Mild Impairment Gait with Vertical Head Turns: Mild Impairment Gait and Pivot Turn: Normal Step Over Obstacle: Mild Impairment       Pertinent Vitals/Pain Pain Assessment: No/denies pain    Home Living Family/patient expects to be discharged to:: Private residence Living Arrangements: Spouse/significant other Available Help at Discharge: Family;Available 24 hours/day Type of Home: House Home Access: Stairs to enter Entrance Stairs-Rails: None Entrance Stairs-Number of Steps: 1 Home Layout: One level Home Equipment: Clinical cytogeneticist - 2 wheels;Cane - single point Additional Comments: Pt. did not use shower seat     Prior Function Level  of Independence: Independent         Comments: 2 falls in the past month after tripping on root of tree and other object     Hand Dominance    Dominant Hand: Left    Extremity/Trunk Assessment   Upper Extremity Assessment: Defer to OT evaluation           Lower Extremity Assessment: Overall WFL for tasks assessed      Cervical / Trunk Assessment: Kyphotic  Communication   Communication: No difficulties  Cognition Arousal/Alertness: Awake/alert Behavior During Therapy: WFL for tasks assessed/performed Overall Cognitive Status: Within Functional Limits for tasks assessed                      General Comments      Exercises        Assessment/Plan    PT Assessment Patient needs continued PT services  PT Diagnosis Difficulty walking   PT Problem List Decreased activity tolerance;Decreased balance;Decreased safety awareness;Cardiopulmonary status limiting activity  PT Treatment Interventions DME instruction;Gait training;Stair training;Functional mobility training;Therapeutic activities;Therapeutic exercise;Balance training;Patient/family education   PT Goals (Current goals can be found in the Care Plan section) Acute Rehab PT Goals Patient Stated Goal: go home PT Goal Formulation: With patient Time For Goal Achievement: 08/04/15 Potential to Achieve Goals: Good    Frequency Min 3X/week   Barriers to discharge Inaccessible home environment 2 steps to enter home    Co-evaluation               End of Session Equipment Utilized During Treatment: Gait belt;Oxygen Activity Tolerance: Patient tolerated treatment well Patient left: in chair;with call bell/phone within reach;with chair alarm set Nurse Communication: Mobility status;Other (comment) (SpO2)         Time: 1130-1145 PT Time Calculation (min) (ACUTE ONLY): 15 min   Charges:   PT Evaluation $PT Eval Low Complexity: 1 Procedure     PT G Codes:       Collie Siad PT, DPT  Pager: [phone] Phone: [phone] 07/21/2015, 12:04 PM

## 2015-07-22 ENCOUNTER — Other Ambulatory Visit: Payer: Self-pay | Admitting: Family Medicine

## 2015-07-22 LAB — BASIC METABOLIC PANEL
ANION GAP: 7 (ref 5–15)
BUN: 25 mg/dL — AB (ref 6–20)
CHLORIDE: 107 mmol/L (ref 101–111)
CO2: 27 mmol/L (ref 22–32)
Calcium: 9.5 mg/dL (ref 8.9–10.3)
Creatinine, Ser: 1.23 mg/dL (ref 0.61–1.24)
GFR calc Af Amer: >60 mL/min (ref >60)
GFR, EST NON AFRICAN AMERICAN: 55 mL/min — AB (ref >60)
Glucose, Bld: 149 mg/dL — ABNORMAL HIGH (ref 65–99)
POTASSIUM: 4.5 mmol/L (ref 3.5–5.1)
Sodium: 141 mmol/L (ref 135–145)

## 2015-07-22 LAB — CBC
HEMATOCRIT: 33.2 % — AB (ref 39.0–52.0)
HEMOGLOBIN: 11 g/dL — AB (ref 13.0–17.0)
MCH: 33.4 pg (ref 26.0–34.0)
MCHC: 33.1 g/dL (ref 30.0–36.0)
MCV: 100.9 fL — AB (ref 78.0–100.0)
Platelets: 248 10*3/uL (ref 150–400)
RBC: 3.29 MIL/uL — AB (ref 4.22–5.81)
RDW: 12.7 % (ref 11.5–15.5)
WBC: 10.9 10*3/uL — ABNORMAL HIGH (ref 4.0–10.5)

## 2015-07-22 LAB — FOLATE RBC
Folate, Hemolysate: 590.7 ng/mL
Folate, RBC: 1669 ng/mL (ref >498)
HEMATOCRIT: 35.4 % — AB (ref 37.5–51.0)

## 2015-07-22 LAB — GLUCOSE, CAPILLARY: Glucose-Capillary: 170 mg/dL — ABNORMAL HIGH (ref 65–99)

## 2015-07-22 LAB — PROCALCITONIN: Procalcitonin: <0.1 ng/mL

## 2015-07-22 MED ORDER — FUROSEMIDE 20 MG PO TABS: 20.0000 mg | ORAL_TABLET | Freq: Every day | ORAL | Status: DC

## 2015-07-22 MED ORDER — IPRATROPIUM-ALBUTEROL 0.5-2.5 (3) MG/3ML IN SOLN: 3.0000 mL | RESPIRATORY_TRACT | Status: DC | PRN

## 2015-07-22 MED ORDER — DOXYCYCLINE HYCLATE 100 MG PO TABS: 100.0000 mg | ORAL_TABLET | Freq: Two times a day (BID) | ORAL | Status: DC

## 2015-07-22 MED ORDER — PREDNISONE 10 MG PO TABS: ORAL_TABLET | ORAL | Status: DC

## 2015-07-22 NOTE — Telephone Encounter (Signed)
Last alprazolam Rx: 06/17/15, #45. Pharmacy requesting #[phone] Next OV: 10/2015 Last UDS:  2015  Please advise request?

## 2015-07-22 NOTE — Consult Note (Signed)
   [employer] [facility] Inpatient Consult   07/22/2015  ANTOINETTE HASKETT 1940/09/10 [id number] Patient was screened for [place] [facility] services under his Medicare benefit.  Patient admitted with COPD exacerbation and his 2nd admission in 6 months.   Met with the patient regarding the benefits of [place] [facility] services.  Explained that [facility] is a covered benefit of insurance.  Patient endorses Dr. [name] as his primary care provider.  Patient quite anxious.  States, "I am planning on following up with my pulmonologist, Dr. [name].  I feel like I will be fine after I see him about my COPD."   Review information for [facility] Care Management and a brochure  was provided with contact information.  Explained that [facility] does not interfere with or replace any services arranged by the inpatient care management staff.  Patient declined services with [facility] [facility] stating, "I will review this information and will call if I need anything extra.   For further questions or changes, please contact: Natividad Brood, RN BSN [facility] Liaison  [phone] business mobile phone Toll free office [phone]

## 2015-07-22 NOTE — Progress Notes (Signed)
discharge instruction and presciption given to pt and sspouse wheeled to lobby by nt

## 2015-07-22 NOTE — Care Management Note (Signed)
Case Management Note  Patient Details  Name: EGHOSA PHETTEPLACE MRN: [id number] Date of Birth: 1940-08-03  Subjective/Objective:       Admitted with COPD             Action/Plan: Patient lives at home with spouse, has home oxygen, nebulizer machine; Pulmonologist is Dr Selinda Orion. Patient plans to be more active, CM informed pt to talk to his physician on the amount of exercise is best for him and how much wine to consume. Patient states that he drinks several glasses of wine while cooking ( champagne glasses). Patient could benefit from a disease management program for COPD; Patient requested [facility]. [name] with [facility] called for arrangements. He has a cane and walker at home but states that he does not use them.   Expected Discharge Date:     07/22/2015             Expected Discharge Plan:  [place]  In-House Referral:    [facility] Discharge planning Services  CM Consult  Choice offered to:  Patient  HH Arranged:  RN, Disease Management, PT [place] Agency:  [place]  Status of Service:  In process, will continue to follow  Sherrilyn Rist [id number] 07/22/2015, 11:47 AM

## 2015-07-22 NOTE — Discharge Summary (Signed)
Physician Discharge Summary   Patient ID: Ronald Bell MRN: [id number] DOB/AGE: 28-Apr-1940 75 y.o.  Admit date: 07/20/2015 Discharge date: 07/22/2015  Primary Care Physician:  Ann Held, DO  Discharge Diagnoses:   . Acute on Chronic respiratory failure with hypoxia and hypercapnia (HCC) . COPD with acute exacerbation ([place]) . AKI (acute kidney injury) ([place]) . Iron deficiency anemia . Hyperlipidemia . COPD GOLD III with min reversibilty  . Coronary artery disease involving native coronary artery of native heart without angina pectoris . Essential hypertension . Paroxysmal atrial fibrillation (HCC) . Chronic diastolic CHF (congestive heart failure) (HCC)    Consults: none   Recommendations for Outpatient Follow-up:  1. Home health PT OT, RN arranged by case management 2. Please repeat CBC/BMET at next visit   DIET: Heart healthy diet    Allergies:  No Known Allergies   DISCHARGE MEDICATIONS: Current Discharge Medication List    START taking these medications   Details  doxycycline (VIBRA-TABS) 100 MG tablet Take 1 tablet (100 mg total) by mouth 2 (two) times daily. X 7days Qty: 14 tablet, Refills: 0    ipratropium-albuterol (DUONEB) 0.5-2.5 (3) MG/3ML SOLN Take 3 mLs by nebulization every 4 (four) hours as needed. Take Nebs three times a day for this week, after that continue as needed for shortness of breath or wheezing Qty: 360 mL, Refills: 3    predniSONE (DELTASONE) 10 MG tablet Prednisone dosing: Take  Prednisone 40mg  (4 tabs) x 2 days, then taper to 30mg  (3 tabs) x 3 days, then 20mg  (2 tabs) x 3days, then 10mg  (1 tab) x 3days, then OFF.  Dispense:  26 tabs, refills: None Qty: 26 tablet, Refills: 0      CONTINUE these medications which have CHANGED   Details  furosemide (LASIX) 20 MG tablet Take 1 tablet (20 mg total) by mouth daily. Qty: 30 tablet, Refills: 3   Associated Diagnoses: Essential hypertension      CONTINUE these medications  which have NOT CHANGED   Details  albuterol (PROVENTIL HFA;VENTOLIN HFA) 108 (90 Base) MCG/ACT inhaler Inhale 2 puffs into the lungs every 4 (four) hours as needed for wheezing or shortness of breath. Qty: 1 Inhaler, Refills: 11   Associated Diagnoses: COPD exacerbation (HCC)    ALPRAZolam (XANAX) 0.5 MG tablet Take 1 tablet (0.5 mg total) by mouth 3 (three) times daily as needed for anxiety. Qty: 90 tablet, Refills: 0    Ascorbic Acid (VITAMIN C) 250 MG tablet Take 500 mg by mouth daily.     aspirin 81 MG tablet Take 81 mg by mouth daily.    atorvastatin (LIPITOR) 40 MG tablet TAKE ONE TABLET BY MOUTH ONCE DAILY Qty: 90 tablet, Refills: 1    citalopram (CELEXA) 10 MG tablet Take 1 tablet (10 mg total) by mouth daily. Qty: 30 tablet, Refills: 3   Associated Diagnoses: Generalized anxiety disorder    diclofenac sodium (VOLTAREN) 1 % GEL Apply 2 g topically 4 (four) times daily. Qty: 100 g, Refills: 1    docusate sodium (COLACE) 100 MG capsule Take 100 mg by mouth 2 (two) times daily.     famotidine (PEPCID) 20 MG tablet Take 20 mg by mouth at bedtime.     fenofibrate 160 MG tablet Take 1 tablet (160 mg total) by mouth daily. Qty: 30 tablet, Refills: 2    ferrous sulfate 325 (65 FE) MG EC tablet Take 1 tablet (325 mg total) by mouth 2 (two) times daily. Qty: 60 tablet, Refills: 11  Associated Diagnoses: Anemia due to chronic blood loss    Glycopyrrolate-Formoterol (BEVESPI AEROSPHERE) 9-4.8 MCG/ACT AERO Inhale 2 puffs into the lungs 2 (two) times daily. Qty: 1 Inhaler, Refills: 11    metoprolol tartrate (LOPRESSOR) 25 MG tablet Take 0.5 tablets (12.5 mg total) by mouth 2 (two) times daily. Qty: 60 tablet, Refills: 6   Associated Diagnoses: Essential hypertension    Multiple Vitamins-Minerals (CENTRUM SILVER PO) Take 1 tablet by mouth daily.     OXYGEN Inhale 2 L into the lungs at bedtime.     potassium chloride SA (K-DUR,KLOR-CON) 20 MEQ tablet Take 1 tablet (20 mEq total)  by mouth daily. Qty: 30 tablet, Refills: 3   Associated Diagnoses: Essential hypertension    temazepam (RESTORIL) 30 MG capsule TAKE ONE CAPSULE BY MOUTH AT BEDTIME AS NEEDED FOR SLEEP Qty: 30 capsule, Refills: 0    pantoprazole (PROTONIX) 40 MG tablet Take 1 tablet (40 mg total) by mouth daily.   Associated Diagnoses: Gastroesophageal reflux disease, esophagitis presence not specified      STOP taking these medications     valsartan (DIOVAN) 80 MG tablet          Brief H and P: For complete details please refer to admission H and P, but in brief Patient is a 75 year old male with paroxysmal atrial fibrillation, COPD on home O2 2 L at night, CAD, chronic diastolic CHF, hypertension, anxiety, depression presented to ED with acute on chronic dyspnea with wheezing. Chest x-ray negative for acute pneumonia. Troponin 0.02, creatinine 2.13, baseline 0.8, hemoglobin 11.5, MCV 102.  Hospital Course:  Acute on chronic hypoxic and hypercapnic respiratory failure due to COPD exacerbation  - Significantly improved. Patient was placed on scheduled DuoNeb's, Brovana, Pulmicort, doxycycline. -He was somewhat tremulous with IV Solu-Medrol, improved after transitioning to oral prednisone.  - He will continue doxycycline for 7 days, prednisone with taper, duonebs TID x 1 week then as needed, bevespi inhaler bid  Alcohol use: Patient drinks a glass of wine everyday - Patient was noticed to be very tremulous, initially there was a concern for alcohol withdrawal however once patient was transitioned to oral prednisone, his tremulousness improved. He did not go into any acute withdrawals.  CAD - Cath in July 2016 revealed 95% stenosis of mid RCA and BMS placed  - EKG without acute ischemic features, troponin wnl  - Continue ASA 81, Lipitor   Paroxysmal atrial fibrillation - In sinus rhythm on admission  - CHADS-VASc is 77 (age x2, CHF, CAD, HTN), Not on AC due to recurrent GIB  -At the time of  admission, he was somewhat hypotensive hence beta blocker, Lasix, ARB held. BP has not improved, patient may restart beta blocker and Lasix (I have decreased Lasix to 20 mg daily)   Chronic diastolic CHF  - TTE (0000000) with EF 123456, grade 1 diastolic dysfunction  - Appeared dry on presentation with acute kidney injury. Lasix was discontinued and he was gently hydrated. - Patient was on Lasix 40 mg daily, now reduced to 20 mg daily at the time of discharge. Please check BMET at follow-up appointment.   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 Valsartan still on hold.    Day of Discharge BP 127/79 mmHg  Pulse 98  Temp(Src) 97.5 F (36.4 C) (Oral)  Resp 18  Ht 5\' 2"  (1.575 m)  Wt 56.9 kg (125 lb 7.1 oz)  BMI 22.94 kg/m2  SpO2 94%  Physical Exam: General: Alert and awake oriented x3 not in any acute distress. HEENT: anicteric sclera, pupils reactive to light and accommodation CVS: S1-S2 clear no murmur rubs or gallops Chest: clear to auscultation bilaterally, no wheezing rales or rhonchi Abdomen: soft nontender, nondistended, normal bowel sounds Extremities: no cyanosis, clubbing or edema noted bilaterally Neuro: Cranial nerves II-XII intact, no focal neurological deficits   The results of significant diagnostics from this hospitalization (including imaging, microbiology, ancillary and laboratory) are listed below for reference.    LAB  RESULTS: Basic Metabolic Panel:  Recent Labs Lab 07/21/15 0317 07/22/15 0427  NA 140 141  K 4.4 4.5  CL 105 107  CO2 26 27  GLUCOSE 185* 149*  BUN 31* 25*  CREATININE 1.68* 1.23  CALCIUM 9.3 9.5   Liver Function Tests: No results for input(s): AST, ALT, ALKPHOS, BILITOT, PROT, ALBUMIN in the last 168 hours. No results for input(s): LIPASE, AMYLASE in the last 168 hours. No results for input(s): AMMONIA in the last 168 hours. CBC:  Recent Labs Lab 07/20/15 1811 07/22/15 0427  WBC 10.5 10.9*  HGB 11.5* 11.0*  HCT 35.9* 33.2*  MCV 102.3* 100.9*  PLT 208 248   Cardiac Enzymes: No results for input(s): CKTOTAL, CKMB, CKMBINDEX, TROPONINI in the last 168 hours. BNP: Invalid input(s): POCBNP CBG:  Recent Labs Lab 07/22/15 0910  GLUCAP 170*    Significant Diagnostic Studies:  Dg Chest 2 View  07/20/2015  CLINICAL DATA:  Fall at home. EXAM: CHEST  2 VIEW COMPARISON:  May 21, 2015. FINDINGS: Stable cardiomediastinal silhouette. Status post aortic valve replacement. Stable bilateral calcified pleural plaques are noted. Atherosclerosis of abdominal aorta is noted. No pneumothorax or pleural effusion is noted. Stable left midlung scarring is noted. No acute pulmonary disease is noted. Atherosclerosis of thoracic aorta is noted. IMPRESSION: Stable calcified pleural plaques. Stable left midlung scarring. No acute cardiopulmonary abnormality seen. Electronically Signed   By: Marijo Conception, M.D.   On: 07/20/2015 18:27    2D ECHO:   Disposition and Follow-up: Discharge Instructions    ([place]) Call MD:  Anytime you have any of the following symptoms: 1) 3 pound weight gain in 24 hours or 5 pounds in 1 week 2) shortness of breath, with or without a dry hacking cough 3) swelling in the hands, feet or stomach 4) if you have to sleep on extra pillows at night in order to breathe.    Complete by:  As directed      Diet - low sodium heart healthy    Complete by:   As directed      Discharge instructions    Complete by:  As directed   Please HOLD Valsartan until follow up with Dr Marlou Porch     Discharge instructions    Complete by:  As directed   Take DuoNebs 3mg /3ml vial three times a day for this week, after that continue as needed for shortness of breath or wheezing (this is the prescription Dr Etter Sjogren gave you).  Use BEVESPI inhaler 2 puffs twice a day   Continue albuterol inhaler as a rescue inhaler daily. Keep it with you everywhere.  Take prednisone with taper as per instructions.  Continue doxycycline antibiotic twice a day with food.     Increase activity slowly    Complete by:  As directed             DISPOSITION: home    DISCHARGE FOLLOW-UP Follow-up Information    Follow up with Ann Held, DO. Go on 07/26/2015.   Specialty:  Family Medicine   Why:  for hospital follow-up @11 :30am   Contact information:   [place] [address] [phone]       Follow up with Christinia Gully, MD. Go on 08/02/2015.   Specialty:  Pulmonary Disease   Why:  for hospital follow-up@9 :00am   Contact information:   [address] [place] [address] [phone]       Follow up with [facility].   Why:  They will do your home health care at your home   Contact information:   [address] [place]  [address] [phone]        Time spent on Discharge: 60mins   Signed:   RAI,RIPUDEEP M.D. [facility] 07/22/2015, 12:40 PM Pager: [phone]

## 2015-07-23 ENCOUNTER — Emergency Department (HOSPITAL_COMMUNITY): Payer: Medicare Other

## 2015-07-23 ENCOUNTER — Inpatient Hospital Stay (HOSPITAL_COMMUNITY)
Admission: EM | Admit: 2015-07-23 | Discharge: 2015-07-27 | DRG: 190 | Disposition: A | Discharge status: Home Health | Payer: Medicare Other | Attending: Internal Medicine | Admitting: Internal Medicine

## 2015-07-23 ENCOUNTER — Encounter (HOSPITAL_COMMUNITY): Payer: Self-pay | Admitting: *Deleted

## 2015-07-23 DIAGNOSIS — I959 Hypotension, unspecified: Secondary | ICD-10-CM | POA: Diagnosis present

## 2015-07-23 DIAGNOSIS — I480 Paroxysmal atrial fibrillation: Secondary | ICD-10-CM | POA: Diagnosis present

## 2015-07-23 DIAGNOSIS — J9622 Acute and chronic respiratory failure with hypercapnia: Secondary | ICD-10-CM | POA: Diagnosis present

## 2015-07-23 DIAGNOSIS — Z79899 Other long term (current) drug therapy: Secondary | ICD-10-CM

## 2015-07-23 DIAGNOSIS — J9621 Acute and chronic respiratory failure with hypoxia: Secondary | ICD-10-CM

## 2015-07-23 DIAGNOSIS — Z954 Presence of other heart-valve replacement: Secondary | ICD-10-CM

## 2015-07-23 DIAGNOSIS — Z953 Presence of xenogenic heart valve: Secondary | ICD-10-CM | POA: Diagnosis not present

## 2015-07-23 DIAGNOSIS — Z77090 Contact with and (suspected) exposure to asbestos: Secondary | ICD-10-CM | POA: Diagnosis present

## 2015-07-23 DIAGNOSIS — D509 Iron deficiency anemia, unspecified: Secondary | ICD-10-CM | POA: Diagnosis present

## 2015-07-23 DIAGNOSIS — R062 Wheezing: Secondary | ICD-10-CM | POA: Diagnosis not present

## 2015-07-23 DIAGNOSIS — Z87891 Personal history of nicotine dependence: Secondary | ICD-10-CM | POA: Diagnosis not present

## 2015-07-23 DIAGNOSIS — I2510 Atherosclerotic heart disease of native coronary artery without angina pectoris: Secondary | ICD-10-CM | POA: Diagnosis present

## 2015-07-23 DIAGNOSIS — I5032 Chronic diastolic (congestive) heart failure: Secondary | ICD-10-CM | POA: Diagnosis present

## 2015-07-23 DIAGNOSIS — F329 Major depressive disorder, single episode, unspecified: Secondary | ICD-10-CM | POA: Diagnosis present

## 2015-07-23 DIAGNOSIS — F419 Anxiety disorder, unspecified: Secondary | ICD-10-CM | POA: Diagnosis present

## 2015-07-23 DIAGNOSIS — I110 Hypertensive heart disease with heart failure: Secondary | ICD-10-CM | POA: Diagnosis present

## 2015-07-23 DIAGNOSIS — K210 Gastro-esophageal reflux disease with esophagitis: Secondary | ICD-10-CM | POA: Diagnosis present

## 2015-07-23 DIAGNOSIS — Z955 Presence of coronary angioplasty implant and graft: Secondary | ICD-10-CM | POA: Diagnosis not present

## 2015-07-23 DIAGNOSIS — Z7982 Long term (current) use of aspirin: Secondary | ICD-10-CM | POA: Diagnosis not present

## 2015-07-23 DIAGNOSIS — K219 Gastro-esophageal reflux disease without esophagitis: Secondary | ICD-10-CM | POA: Diagnosis present

## 2015-07-23 DIAGNOSIS — J449 Chronic obstructive pulmonary disease, unspecified: Secondary | ICD-10-CM | POA: Diagnosis not present

## 2015-07-23 DIAGNOSIS — D539 Nutritional anemia, unspecified: Secondary | ICD-10-CM | POA: Diagnosis present

## 2015-07-23 DIAGNOSIS — I10 Essential (primary) hypertension: Secondary | ICD-10-CM | POA: Diagnosis present

## 2015-07-23 DIAGNOSIS — J9620 Acute and chronic respiratory failure, unspecified whether with hypoxia or hypercapnia: Secondary | ICD-10-CM | POA: Diagnosis present

## 2015-07-23 DIAGNOSIS — R0602 Shortness of breath: Secondary | ICD-10-CM | POA: Diagnosis not present

## 2015-07-23 DIAGNOSIS — E785 Hyperlipidemia, unspecified: Secondary | ICD-10-CM | POA: Diagnosis present

## 2015-07-23 DIAGNOSIS — I739 Peripheral vascular disease, unspecified: Secondary | ICD-10-CM | POA: Diagnosis present

## 2015-07-23 DIAGNOSIS — I509 Heart failure, unspecified: Secondary | ICD-10-CM | POA: Diagnosis not present

## 2015-07-23 DIAGNOSIS — K5521 Angiodysplasia of colon with hemorrhage: Secondary | ICD-10-CM

## 2015-07-23 DIAGNOSIS — Z95820 Peripheral vascular angioplasty status with implants and grafts: Secondary | ICD-10-CM

## 2015-07-23 DIAGNOSIS — R069 Unspecified abnormalities of breathing: Secondary | ICD-10-CM | POA: Diagnosis not present

## 2015-07-23 DIAGNOSIS — Z952 Presence of prosthetic heart valve: Secondary | ICD-10-CM

## 2015-07-23 DIAGNOSIS — Z8249 Family history of ischemic heart disease and other diseases of the circulatory system: Secondary | ICD-10-CM | POA: Diagnosis not present

## 2015-07-23 DIAGNOSIS — J441 Chronic obstructive pulmonary disease with (acute) exacerbation: Secondary | ICD-10-CM | POA: Diagnosis not present

## 2015-07-23 HISTORY — DX: Dependence on supplemental oxygen: Z99.81

## 2015-07-23 LAB — I-STAT VENOUS BLOOD GAS, ED
Acid-Base Excess: 4 mmol/L — ABNORMAL HIGH (ref 0.0–2.0)
BICARBONATE: 32.2 meq/L — AB (ref 20.0–24.0)
O2 Saturation: 55 %
PCO2 VEN: 64.7 mmHg — AB (ref 45.0–50.0)
PH VEN: 7.305 — AB (ref 7.250–7.300)
TCO2: 34 mmol/L (ref 0–100)
pO2, Ven: 33 mmHg (ref 31.0–45.0)

## 2015-07-23 LAB — BASIC METABOLIC PANEL
ANION GAP: 7 (ref 5–15)
Anion gap: 9 (ref 5–15)
BUN: 22 mg/dL — ABNORMAL HIGH (ref 6–20)
BUN: 19 mg/dL (ref 6–20)
CALCIUM: 9.4 mg/dL (ref 8.9–10.3)
CALCIUM: 9.5 mg/dL (ref 8.9–10.3)
CHLORIDE: 105 mmol/L (ref 101–111)
CO2: 23 mmol/L (ref 22–32)
CO2: 27 mmol/L (ref 22–32)
Chloride: 106 mmol/L (ref 101–111)
Creatinine, Ser: 1.27 mg/dL — ABNORMAL HIGH (ref 0.61–1.24)
Creatinine, Ser: 1.04 mg/dL (ref 0.61–1.24)
GFR calc Af Amer: >60 mL/min (ref >60)
GFR calc non Af Amer: 53 mL/min — ABNORMAL LOW (ref >60)
GLUCOSE: 113 mg/dL — AB (ref 65–99)
GLUCOSE: 135 mg/dL — AB (ref 65–99)
Potassium: 5.5 mmol/L — ABNORMAL HIGH (ref 3.5–5.1)
Potassium: 4.8 mmol/L (ref 3.5–5.1)
SODIUM: 138 mmol/L (ref 135–145)
Sodium: 139 mmol/L (ref 135–145)

## 2015-07-23 LAB — CBC WITH DIFFERENTIAL/PLATELET
Basophils Absolute: 0 10*3/uL (ref 0.0–0.1)
Basophils Relative: 0 %
EOS ABS: 0 10*3/uL (ref 0.0–0.7)
Eosinophils Relative: 0 %
HEMATOCRIT: 36.9 % — AB (ref 39.0–52.0)
HEMOGLOBIN: 11.9 g/dL — AB (ref 13.0–17.0)
LYMPHS ABS: 1.4 10*3/uL (ref 0.7–4.0)
LYMPHS PCT: 13 %
MCH: 32.3 pg (ref 26.0–34.0)
MCHC: 32.2 g/dL (ref 30.0–36.0)
MCV: 100.3 fL — AB (ref 78.0–100.0)
MONOS PCT: 11 %
Monocytes Absolute: 1.1 10*3/uL — ABNORMAL HIGH (ref 0.1–1.0)
NEUTROS ABS: 8.1 10*3/uL — AB (ref 1.7–7.7)
NEUTROS PCT: 76 %
Platelets: 309 10*3/uL (ref 150–400)
RBC: 3.68 MIL/uL — AB (ref 4.22–5.81)
RDW: 13 % (ref 11.5–15.5)
WBC: 10.7 10*3/uL — AB (ref 4.0–10.5)

## 2015-07-23 LAB — I-STAT CHEM 8, ED
BUN: 34 mg/dL — AB (ref 6–20)
CALCIUM ION: 1.21 mmol/L (ref 1.13–1.30)
CHLORIDE: 104 mmol/L (ref 101–111)
Creatinine, Ser: 1.3 mg/dL — ABNORMAL HIGH (ref 0.61–1.24)
Glucose, Bld: 112 mg/dL — ABNORMAL HIGH (ref 65–99)
HEMATOCRIT: 40 % (ref 39.0–52.0)
Hemoglobin: 13.6 g/dL (ref 13.0–17.0)
POTASSIUM: 5.3 mmol/L — AB (ref 3.5–5.1)
SODIUM: 141 mmol/L (ref 135–145)
TCO2: 29 mmol/L (ref 0–100)

## 2015-07-23 LAB — I-STAT TROPONIN, ED: Troponin i, poc: 0.09 ng/mL (ref 0.00–0.08)

## 2015-07-23 LAB — BRAIN NATRIURETIC PEPTIDE: B Natriuretic Peptide: 259.6 pg/mL — ABNORMAL HIGH (ref 0.0–100.0)

## 2015-07-23 LAB — I-STAT CG4 LACTIC ACID, ED: LACTIC ACID, VENOUS: 1.28 mmol/L (ref 0.5–2.0)

## 2015-07-23 MED ORDER — FUROSEMIDE 20 MG PO TABS
20.0000 mg | ORAL_TABLET | Freq: Every day | ORAL | Status: DC
  Administered 2015-07-23 – 2015-07-27 (×5): 20 mg via ORAL
  Filled 2015-07-23 (×5): qty 1

## 2015-07-23 MED ORDER — ARFORMOTEROL TARTRATE 15 MCG/2ML IN NEBU
15.0000 ug | INHALATION_SOLUTION | Freq: Two times a day (BID) | RESPIRATORY_TRACT | Status: DC
  Administered 2015-07-23 – 2015-07-27 (×9): 15 ug via RESPIRATORY_TRACT
  Filled 2015-07-23 (×9): qty 2

## 2015-07-23 MED ORDER — MAGNESIUM SULFATE 2 GM/50ML IV SOLN
2.0000 g | Freq: Once | INTRAVENOUS | Status: AC
  Administered 2015-07-23: 2 g via INTRAVENOUS
  Filled 2015-07-23: qty 50

## 2015-07-23 MED ORDER — ATORVASTATIN CALCIUM 40 MG PO TABS
40.0000 mg | ORAL_TABLET | Freq: Every day | ORAL | Status: DC
  Administered 2015-07-23 – 2015-07-27 (×5): 40 mg via ORAL
  Filled 2015-07-23 (×5): qty 1

## 2015-07-23 MED ORDER — ALBUTEROL SULFATE (2.5 MG/3ML) 0.083% IN NEBU: 2.5000 mg | INHALATION_SOLUTION | RESPIRATORY_TRACT | Status: DC | PRN

## 2015-07-23 MED ORDER — FERROUS SULFATE 325 (65 FE) MG PO TABS
325.0000 mg | ORAL_TABLET | Freq: Two times a day (BID) | ORAL | Status: DC
  Administered 2015-07-23 – 2015-07-27 (×8): 325 mg via ORAL
  Filled 2015-07-23 (×8): qty 1

## 2015-07-23 MED ORDER — ASPIRIN EC 81 MG PO TBEC
81.0000 mg | DELAYED_RELEASE_TABLET | Freq: Every day | ORAL | Status: DC
  Administered 2015-07-23 – 2015-07-27 (×5): 81 mg via ORAL
  Filled 2015-07-23 (×5): qty 1

## 2015-07-23 MED ORDER — ALBUTEROL (5 MG/ML) CONTINUOUS INHALATION SOLN
15.0000 mg/h | INHALATION_SOLUTION | RESPIRATORY_TRACT | Status: AC
  Administered 2015-07-23: 15 mg/h via RESPIRATORY_TRACT

## 2015-07-23 MED ORDER — IPRATROPIUM-ALBUTEROL 0.5-2.5 (3) MG/3ML IN SOLN: 3.0000 mL | RESPIRATORY_TRACT | Status: DC | PRN

## 2015-07-23 MED ORDER — FENOFIBRATE 160 MG PO TABS
160.0000 mg | ORAL_TABLET | Freq: Every day | ORAL | Status: DC
  Administered 2015-07-23 – 2015-07-27 (×5): 160 mg via ORAL
  Filled 2015-07-23 (×6): qty 1

## 2015-07-23 MED ORDER — CITALOPRAM HYDROBROMIDE 20 MG PO TABS
10.0000 mg | ORAL_TABLET | Freq: Every day | ORAL | Status: DC
  Administered 2015-07-23 – 2015-07-27 (×5): 10 mg via ORAL
  Filled 2015-07-23 (×5): qty 1

## 2015-07-23 MED ORDER — SODIUM CHLORIDE 0.9 % IV BOLUS (SEPSIS)
500.0000 mL | Freq: Once | INTRAVENOUS | Status: AC
  Administered 2015-07-23: 500 mL via INTRAVENOUS

## 2015-07-23 MED ORDER — PANTOPRAZOLE SODIUM 40 MG PO TBEC
40.0000 mg | DELAYED_RELEASE_TABLET | Freq: Every day | ORAL | Status: DC
  Administered 2015-07-23 – 2015-07-27 (×5): 40 mg via ORAL
  Filled 2015-07-23 (×6): qty 1

## 2015-07-23 MED ORDER — IOPAMIDOL (ISOVUE-370) INJECTION 76%
INTRAVENOUS | Status: AC
  Administered 2015-07-23: 100 mL
  Filled 2015-07-23: qty 100

## 2015-07-23 MED ORDER — SODIUM POLYSTYRENE SULFONATE 15 GM/60ML PO SUSP
30.0000 g | Freq: Once | ORAL | Status: DC
  Filled 2015-07-23: qty 120

## 2015-07-23 MED ORDER — BUDESONIDE 0.25 MG/2ML IN SUSP
0.2500 mg | Freq: Two times a day (BID) | RESPIRATORY_TRACT | Status: DC
  Administered 2015-07-23 – 2015-07-27 (×9): 0.25 mg via RESPIRATORY_TRACT
  Filled 2015-07-23 (×9): qty 2

## 2015-07-23 MED ORDER — DICLOFENAC SODIUM 1 % TD GEL: 2.0000 g | Freq: Four times a day (QID) | TRANSDERMAL | Status: DC | PRN

## 2015-07-23 MED ORDER — DOCUSATE SODIUM 100 MG PO CAPS
100.0000 mg | ORAL_CAPSULE | Freq: Two times a day (BID) | ORAL | Status: DC
  Administered 2015-07-23 – 2015-07-27 (×9): 100 mg via ORAL
  Filled 2015-07-23 (×9): qty 1

## 2015-07-23 MED ORDER — FAMOTIDINE 20 MG PO TABS
20.0000 mg | ORAL_TABLET | Freq: Every day | ORAL | Status: DC
  Administered 2015-07-23 – 2015-07-26 (×4): 20 mg via ORAL
  Filled 2015-07-23 (×4): qty 1

## 2015-07-23 MED ORDER — ENOXAPARIN SODIUM 40 MG/0.4ML ~~LOC~~ SOLN
40.0000 mg | Freq: Every day | SUBCUTANEOUS | Status: DC
  Administered 2015-07-23 – 2015-07-27 (×5): 40 mg via SUBCUTANEOUS
  Filled 2015-07-23 (×7): qty 0.4

## 2015-07-23 MED ORDER — TEMAZEPAM 15 MG PO CAPS
30.0000 mg | ORAL_CAPSULE | Freq: Every evening | ORAL | Status: DC | PRN
  Administered 2015-07-23 – 2015-07-26 (×4): 30 mg via ORAL
  Filled 2015-07-23 (×4): qty 2

## 2015-07-23 MED ORDER — PREDNISONE 20 MG PO TABS
40.0000 mg | ORAL_TABLET | Freq: Every day | ORAL | Status: DC
  Administered 2015-07-23 – 2015-07-27 (×5): 40 mg via ORAL
  Filled 2015-07-23 (×7): qty 2

## 2015-07-23 MED ORDER — IPRATROPIUM-ALBUTEROL 0.5-2.5 (3) MG/3ML IN SOLN
3.0000 mL | RESPIRATORY_TRACT | Status: DC
  Administered 2015-07-23: 3 mL via RESPIRATORY_TRACT
  Filled 2015-07-23: qty 3

## 2015-07-23 MED ORDER — PREDNISONE 20 MG PO TABS: 50.0000 mg | ORAL_TABLET | Freq: Every day | ORAL | Status: DC

## 2015-07-23 MED ORDER — IPRATROPIUM-ALBUTEROL 0.5-2.5 (3) MG/3ML IN SOLN
3.0000 mL | Freq: Four times a day (QID) | RESPIRATORY_TRACT | Status: DC
  Administered 2015-07-23 – 2015-07-27 (×14): 3 mL via RESPIRATORY_TRACT
  Filled 2015-07-23 (×15): qty 3

## 2015-07-23 MED ORDER — POTASSIUM CHLORIDE CRYS ER 20 MEQ PO TBCR: 20.0000 meq | EXTENDED_RELEASE_TABLET | Freq: Every day | ORAL | Status: DC

## 2015-07-23 MED ORDER — DOXYCYCLINE HYCLATE 100 MG PO TABS
100.0000 mg | ORAL_TABLET | Freq: Two times a day (BID) | ORAL | Status: DC
  Administered 2015-07-23 – 2015-07-27 (×9): 100 mg via ORAL
  Filled 2015-07-23 (×9): qty 1

## 2015-07-23 MED ORDER — METOPROLOL TARTRATE 12.5 MG HALF TABLET
12.5000 mg | ORAL_TABLET | Freq: Two times a day (BID) | ORAL | Status: DC
  Administered 2015-07-23 – 2015-07-27 (×9): 12.5 mg via ORAL
  Filled 2015-07-23 (×10): qty 1

## 2015-07-23 MED ORDER — ALBUTEROL SULFATE HFA 108 (90 BASE) MCG/ACT IN AERS: 2.0000 | INHALATION_SPRAY | RESPIRATORY_TRACT | Status: DC | PRN

## 2015-07-23 NOTE — ED Notes (Signed)
MD Little informed of patient's low BP readings.

## 2015-07-23 NOTE — Progress Notes (Signed)
CAT STARTED  

## 2015-07-23 NOTE — Progress Notes (Signed)
[facility]                                                                              Patient Demographics  Ronald Bell, is a 75 y.o. male, DOB - 19-Jan-1941, [id number]  Admit date - 07/23/2015   Admitting Physician Etta Quill, DO  Outpatient Primary MD for the patient is Ann Held, DO  Outpatient specialists:   LOS - 0  days    Chief Complaint  Patient presents with  . Respiratory Distress       Brief summary   Ronald Bell is a 75 y.o. male with medical history significant of COPD, just discharged from our service On 5/1 after an admission for acute COPD exacerbation. Patient is on 2L home O2 at night. Patient presented to the ED on CPAP with c/o sudden onset of SOB on the night of admission with associated dry cough. Per EMS O2 sats were 76% on arrival. Patient was started on Solumedrol and duoneb en route to ED.   Assessment & Plan    Acute on chronic hypoxic and hypercapnic respiratory failure due to COPD exacerbation - wheezing - CT angiogram negative for pulmonary embolus, chronic changes of diffuse emphysematous changes, fibrosis and calcified pleural plaques. - continue scheduled nebs, Brovana, Pulmicort, doxycycline. - Patient was tremulous with IV Solu-Medrol during the last admission, will continue oral prednisone for now  CAD - Cath in July 2016 revealed 95% stenosis of mid RCA and BMS placed  - Continue ASA 81, Lipitor   Paroxysmal atrial fibrillation - In sinus rhythm on admission  - CHADS-VASc is 93 (age x2, CHF, CAD, HTN), Not on AC due to recurrent GIB  -Currently holding beta blocker secondary to hypotension   Chronic diastolic CHF  - TTE (0000000) with EF 123456, grade 1 diastolic dysfunction  - Continue aspirin, Lipitor, Lasix 20 mg daily per his home dose  hypotension with history of hypertension - Continue Lasix,metoprolol   Iron-deficiency anemia, macrocytic secondary to  alcohol use  - H&H stable - B12 normal, folate normal  Depression, anxiety  Continue Celexa, Xanax   Code Status: Full CODE STATUS  Family Communication: Discussed in detail with the patient, all imaging results, lab results explained to the patient   Disposition Plan:   Time Spent in minutes   25 minutes  Procedures  CTA chest  Consults     DVT Prophylaxis  Lovenox   Medications  Scheduled Meds: . arformoterol  15 mcg Nebulization BID  . aspirin EC  81 mg Oral Daily  . atorvastatin  40 mg Oral Daily  . budesonide  0.25 mg Inhalation BID  . citalopram  10 mg Oral Daily  . docusate sodium  100 mg Oral BID  . doxycycline  100 mg Oral BID  . enoxaparin (LOVENOX) injection  40 mg Subcutaneous Daily  . famotidine  20 mg Oral QHS  . fenofibrate  160 mg Oral Daily  . ferrous sulfate  325 mg Oral BID WC  . furosemide  20 mg Oral Daily  . metoprolol tartrate  12.5 mg Oral BID  . pantoprazole  40 mg  Oral Daily  . predniSONE  40 mg Oral Q breakfast  . sodium polystyrene  30 g Oral Once   Continuous Infusions:  PRN Meds:.albuterol, diclofenac sodium, ipratropium-albuterol, temazepam   Antibiotics   Anti-infectives    Start     Dose/Rate Route Frequency Ordered Stop   07/23/15 1000  doxycycline (VIBRA-TABS) tablet 100 mg     100 mg Oral 2 times daily 07/23/15 0449          Subjective:   Ronald Bell was seen and examined today. Still having wheezing, overall slightly improvement. No fevers or chills. Patient denies dizziness, chest pain, abdominal pain, N/V/D/C, new weakness, numbess, tingling.   Objective:   Filed Vitals:   07/23/15 0945 07/23/15 1015 07/23/15 1218 07/23/15 1219  BP: 132/64 142/61 167/85 168/83  Pulse: 126 113 114   Temp:   97.5 F (36.4 C)   TempSrc:   Oral   Resp: 23 21 20    SpO2: 95% 94% 99%    No intake or output data in the 24 hours ending 07/23/15 1230   Wt Readings from Last 3 Encounters:  07/22/15 56.9 kg (125 lb 7.1  oz)  05/27/15 61.78 kg (136 lb 3.2 oz)  05/24/15 56.609 kg (124 lb 12.8 oz)     Exam  General: Alert and oriented x 3, NAD  HEENT:  PERRLA, EOMI, Anicteric Sclera, mucous membranes moist.   Neck: Supple, no JVD, no masses  CVS: S1 S2 auscultated, no rubs, murmurs or gallops. Regular rate and rhythm.  Respiratory: Chest tight, diffuse wheezing bilaterally   Abdomen: Soft, nontender, nondistended, + bowel sounds  Ext: no cyanosis clubbing or edema, somewhat tremulous   Neuro: AAOx3, Cr N's II- XII. Strength 5/5 upper and lower extremities bilaterally  Skin: No rashes  Psych: Normal affect and demeanor, alert and oriented x3    Data Reviewed:  I have personally reviewed following labs and imaging studies  Micro Results No results found for this or any previous visit (from the past 240 hour(s)).  Radiology Reports Dg Chest 2 View  07/20/2015  CLINICAL DATA:  Fall at home. EXAM: CHEST  2 VIEW COMPARISON:  May 21, 2015. FINDINGS: Stable cardiomediastinal silhouette. Status post aortic valve replacement. Stable bilateral calcified pleural plaques are noted. Atherosclerosis of abdominal aorta is noted. No pneumothorax or pleural effusion is noted. Stable left midlung scarring is noted. No acute pulmonary disease is noted. Atherosclerosis of thoracic aorta is noted. IMPRESSION: Stable calcified pleural plaques. Stable left midlung scarring. No acute cardiopulmonary abnormality seen. Electronically Signed   By: Marijo Conception, M.D.   On: 07/20/2015 18:27   Ct Angio Chest Pe W/cm &/or Wo Cm  07/23/2015  CLINICAL DATA:  Sudden worsening of shortness of breath. Recent hospitalization for COPD. EXAM: CT ANGIOGRAPHY CHEST WITH CONTRAST TECHNIQUE: Multidetector CT imaging of the chest was performed using the standard protocol during bolus administration of intravenous contrast. Multiplanar CT image reconstructions and MIPs were obtained to evaluate the vascular anatomy. CONTRAST:  100 mL  Isovue 370 COMPARISON:  05/21/2015 FINDINGS: Technically adequate study with good opacification of the central and segmental pulmonary arteries. No focal filling defects demonstrated. No evidence of significant pulmonary embolus. Normal heart size. Normal caliber thoracic aorta. Great vessel origins are patent. No aortic dissection. Calcification of the aorta and coronary arteries. Postoperative changes in the aortic valve. Esophagus is decompressed. No significant lymphadenopathy in the chest. Multiple bilateral calcified pleural plaques. Emphysematous changes in the lungs. Focal scarring or spiculation in  the right upper lung, measuring about 5 x 9 mm. No significant changes since previous study dated 03/10/2013 suggesting benign etiology. Focal atelectasis in the lung bases. No pleural effusions. No pneumothorax. Included portions of the upper abdominal organs are grossly unremarkable. Degenerative changes in the spine. No destructive bone lesions. Review of the MIP images confirms the above findings. IMPRESSION: No evidence of significant pulmonary embolus. Stable chronic changes in the lungs with diffuse emphysematous change, fibrosis, and calcified pleural plaques. Electronically Signed   By: Lucienne Capers M.D.   On: 07/23/2015 03:38   Dg Chest Port 1 View  07/23/2015  CLINICAL DATA:  Sudden worsening of shortness of breath, cough, and CHF. EXAM: PORTABLE CHEST 1 VIEW COMPARISON:  07/20/2015 FINDINGS: Postoperative changes in the heart and in the left lower rib. Heart size and pulmonary vascularity are normal. Emphysematous changes in the lungs with scattered fibrosis. Multiple calcified pleural plaques bilaterally. No focal airspace disease or consolidation. Linear scarring in the left mid lung. No blunting of costophrenic angles. No pneumothorax. Mediastinal contours appear intact. Calcified and tortuous aorta. IMPRESSION: Stable appearance of chronic changes in the chest, including emphysematous  changes, fibrosis, calcified pleural plaques, and scarring in the left mid lung. Normal heart size. Electronically Signed   By: Lucienne Capers M.D.   On: 07/23/2015 01:35    CBC  Recent Labs Lab 07/20/15 1811 07/20/15 2045 07/22/15 0427 07/23/15 0039 07/23/15 0047  WBC 10.5  --  10.9* 10.7*  --   HGB 11.5*  --  11.0* 11.9* 13.6  HCT 35.9* 35.4* 33.2* 36.9* 40.0  PLT 208  --  248 309  --   MCV 102.3*  --  100.9* 100.3*  --   MCH 32.8  --  33.4 32.3  --   MCHC 32.0  --  33.1 32.2  --   RDW 13.0  --  12.7 13.0  --   LYMPHSABS  --   --   --  1.4  --   MONOABS  --   --   --  1.1*  --   EOSABS  --   --   --  0.0  --   BASOSABS  --   --   --  0.0  --     Chemistries   Recent Labs Lab 07/20/15 1811 07/21/15 0317 07/22/15 0427 07/23/15 0039 07/23/15 0047 07/23/15 1007  NA 140 140 141 139 141 138  K 4.0 4.4 4.5 5.5* 5.3* 4.8  CL 104 105 107 105 104 106  CO2 21* 26 27 27   --  23  GLUCOSE 158* 185* 149* 113* 112* 135*  BUN 33* 31* 25* 22* 34* 19  CREATININE 2.13* 1.68* 1.23 1.27* 1.30* 1.04  CALCIUM 9.5 9.3 9.5 9.5  --  9.4   ------------------------------------------------------------------------------------------------------------------ estimated creatinine clearance is 46.7 mL/min (by C-G formula based on Cr of 1.04). ------------------------------------------------------------------------------------------------------------------ No results for input(s): HGBA1C in the last 72 hours. ------------------------------------------------------------------------------------------------------------------ No results for input(s): CHOL, HDL, LDLCALC, TRIG, CHOLHDL, LDLDIRECT in the last 72 hours. ------------------------------------------------------------------------------------------------------------------ No results for input(s): TSH, T4TOTAL, T3FREE, THYROIDAB in the last 72 hours.  Invalid input(s):  FREET3 ------------------------------------------------------------------------------------------------------------------  Recent Labs  07/20/15 2045  VITAMINB12 382    Coagulation profile No results for input(s): INR, PROTIME in the last 168 hours.  No results for input(s): DDIMER in the last 72 hours.  Cardiac Enzymes No results for input(s): CKMB, TROPONINI, MYOGLOBIN in the last 168 hours.  Invalid input(s): CK ------------------------------------------------------------------------------------------------------------------ Invalid input(s): POCBNP  Recent Labs  07/22/15 0910  GLUCAP 170*     Bralon Antkowiak M.D. [facility] 07/23/2015, 12:30 PM  Pager: [phone] Between 7am to 7pm - call Pager - [phone]  After 7pm go to [email or web address] - password [email or web address]  Call night coverage person covering after 7pm

## 2015-07-23 NOTE — ED Notes (Signed)
Paged admitting to [place], [place].

## 2015-07-23 NOTE — H&P (Signed)
History and Physical    Ronald Bell [id number] DOB: 09/23/1940 DOA: 07/23/2015  Referring MD/NP/PA: Dr. Rex Kras PCP: Ann Held, DO Outpatient Specialists: None Patient coming from: Home  Chief Complaint: SOB  HPI: Ronald Bell is a 75 y.o. male with medical history significant of COPD, just discharged from our service yesterday after an admission for acute exacerbation.  2L home O2 at night.  Patient presents to the ED on CPAP with c/o sudden onset of SOB tonight with associated dry cough.  Per EMS O2 sats were 76% on arrival.  Solumedrol and duoneb en route to ED.  ED Course: Continuous neb treatment dramatically improved his breathing in the ED.  He was tachycardic from the neb treatments.  Patients BP was noted to be low on several measurements in the ED and CTA PE was performed which was negative for PE as well as aortic dissection.  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  Review of Systems: As per HPI otherwise 10 point review of systems negative.    Past Medical History  Diagnosis Date  . Polyp of nasal cavity   . Rosacea   . Hx of adenomatous colonic polyps 2012, 2013.   . Tobacco abuse   . Hypertension   . Hyperlipidemia   . GERD (gastroesophageal reflux disease)   . GI bleed 2010    4 units PRBCs  . Peripheral vascular disease ([place])   . Irregular heartbeat   . Shingles   . Carotid artery occlusion   . Angiodysplasia of intestine with hemorrhage     large and SB, gastric AVMs.   . Pleural plaque with presence of asbestos 03/27/2013    Followed in Pulmonary clinic/ [facility]/ [place] - F/u CT 09/08/2013 1. Stable extensive calcified pleural plaque formation consistent with asbestos exposure. 2. Multiple pulmonary nodules are unchanged from the CT of 6 months ago. Given risk factors for lung cancer, continued follow up is recommended  with chest CT in 6 months> done 04/20/14 no change >repeat in 12 m in tickle file     . GERD 11/30/2008    Qualifier: Diagnosis of  By: Marijean Niemann CMA, Danielle    . PVD (peripheral vascular disease) ([place]) 10/18/2012  . Carotid artery stenosis 04/22/2012  . Coronary artery disease   . COPD GOLD III with min reversibilty  08/11/2006    Followed in Pulmonary clinic/  [facility]/ [place] - PFT's 04/28/2013  FEV1 0.88 (40%) with ratio 44 and 14% better p B2 dlco 45 corrects to 83 - Trial of breo 04/28/2013 > improved symptoms  06/09/2013  - spirometry 06/04/2014 FEV1  0.76 (29%) ratio 45      . Iron deficiency anemia 01/25/2009    Qualifier: Diagnosis of  By: Henrene Pastor MD, Docia Chuck   . History of blood transfusion "couple times"    "related to bleeding in colon and esophagus"  . Arthritis     "left shoulder" (10/19/2014)  . Anxiety   . 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 Iliac artery stent Left 2005    CIA  . Esophagogastroduodenoscopy  2012    normal  . Colonoscopy  July 2015    Dr. Henrene Pastor  .  Tonsillectomy    . Knee arthroscopy with medial menisectomy Left 03/08/2014    Procedure: LEFT KNEE SCOPE WITH MEDIAL MENISECTOMY AND CHONDROPLASTY;  Surgeon: Ninetta Lights, MD;  Location: [place];  Service: Orthopedics;  Laterality: Left;  . Left and right heart catheterization with coronary angiogram N/A 06/28/2014    Procedure: LEFT AND RIGHT HEART CATHETERIZATION WITH CORONARY ANGIOGRAM;  Surgeon: Jerline Pain, MD;  Location: [facility] CATH LAB;  Service: Cardiovascular;  Laterality: N/A;  . Esophagogastroduodenoscopy N/A 08/17/2014    Procedure: ESOPHAGOGASTRODUODENOSCOPY (EGD);  Surgeon: Jerene Bears, MD;  Location: [facility] ENDOSCOPY;  Service: Endoscopy;  Laterality: N/A;  . Colonoscopy N/A 08/17/2014    Procedure: COLONOSCOPY;  Surgeon: Jerene Bears, MD;   Location: [facility] ENDOSCOPY;  Service: Endoscopy;  Laterality: N/A;  . Cardiac catheterization  2001; 06/28/2014  . Cardiac catheterization N/A 10/19/2014    Procedure: Coronary Stent Intervention;  Surgeon: Burnell Blanks, MD;  Location: [place] CV LAB;  Service: Cardiovascular;  Laterality: N/A;  BMS Mid RCA  . Transcatheter aortic valve replacement, transapical N/A 11/20/2014    Procedure: TRANSCATHETER AORTIC VALVE REPLACEMENT, TRANSAPICAL;  Surgeon: [name], MD;  Location: [place];  Service: Open Heart Surgery;  Laterality: N/A;  . Tee without cardioversion N/A 11/20/2014    Procedure: TRANSESOPHAGEAL ECHOCARDIOGRAM (TEE);  Surgeon: [name], MD;  Location: [place];  Service: Open Heart Surgery;  Laterality: N/A;  . Rib plating Left 11/20/2014    Procedure: RIB PLATING OF LEFT 8TH RIB;  Surgeon: [name], MD;  Location: [place];  Service: Open Heart Surgery;  Laterality: Left;  . Colonoscopy N/A 12/05/2014    Procedure: COLONOSCOPY;  Surgeon: Manus Gunning, MD;  Location: [place];  Service: Gastroenterology;  Laterality: N/A;  . Enteroscopy N/A 12/05/2014    Procedure: ENTEROSCOPY;  Surgeon: Manus Gunning, MD;  Location: [place];  Service: Gastroenterology;  Laterality: N/A;     reports that he quit smoking about 3 months ago. His smoking use included Cigarettes and E-cigarettes. He has a 60 pack-year smoking history. He has never used smokeless tobacco. He reports that he drinks about 8.4 oz of alcohol per week. He reports that he does not use illicit drugs.  No Known Allergies  Family History  Problem Relation Age of Onset  . Lung disease Mother     pulm fibrosis  . Colitis Father   . Colon cancer Neg Hx   . Heart disease Brother   . Hypertension Brother   . Hyperlipidemia Brother   . CAD Daughter     cad  . Hypertension Son      Prior to Admission medications   Medication Sig Start Date End Date Taking? Authorizing Provider    albuterol (PROVENTIL HFA;VENTOLIN HFA) 108 (90 Base) MCG/ACT inhaler Inhale 2 puffs into the lungs every 4 (four) hours as needed for wheezing or shortness of breath. 05/23/15  Yes Yvonne R Lowne Chase, DO  Ascorbic Acid (VITAMIN C) 250 MG tablet Take 500 mg by mouth daily.    Yes Historical Provider, MD  aspirin 81 MG tablet Take 81 mg by mouth daily.   Yes Historical Provider, MD  atorvastatin (LIPITOR) 40 MG tablet TAKE ONE TABLET BY MOUTH ONCE DAILY 02/12/15  Yes Yvonne R Lowne Chase, DO  citalopram (CELEXA) 10 MG tablet Take 1 tablet (10 mg total) by mouth daily. 05/09/15  Yes Yvonne R Lowne Chase, DO  diclofenac sodium (VOLTAREN) 1 % GEL Apply 2 g  topically 4 (four) times daily. Patient taking differently: Apply 2 g topically 4 (four) times daily as needed (for pain).  04/25/15  Yes Yvonne R Lowne Chase, DO  docusate sodium (COLACE) 100 MG capsule Take 100 mg by mouth 2 (two) times daily.    Yes Historical Provider, MD  famotidine (PEPCID) 20 MG tablet Take 20 mg by mouth at bedtime.    Yes Historical Provider, MD  fenofibrate 160 MG tablet Take 1 tablet (160 mg total) by mouth daily. 05/15/15  Yes Alferd Apa Lowne Chase, DO  ferrous sulfate 325 (65 FE) MG EC tablet Take 1 tablet (325 mg total) by mouth 2 (two) times daily. 05/09/15  Yes Yvonne R Lowne Chase, DO  Glycopyrrolate-Formoterol (BEVESPI AEROSPHERE) 9-4.8 MCG/ACT AERO Inhale 2 puffs into the lungs 2 (two) times daily. 05/24/15  Yes Tanda Rockers, MD  metoprolol tartrate (LOPRESSOR) 25 MG tablet Take 0.5 tablets (12.5 mg total) by mouth 2 (two) times daily. 05/09/15  Yes Yvonne R Lowne Chase, DO  Multiple Vitamins-Minerals (CENTRUM SILVER PO) Take 1 tablet by mouth daily.    Yes Historical Provider, MD  OXYGEN Inhale 2 L into the lungs at bedtime.    Yes Historical Provider, MD  pantoprazole (PROTONIX) 40 MG tablet Take 1 tablet (40 mg total) by mouth daily. 05/09/15  Yes Yvonne R Lowne Chase, DO  potassium chloride SA (K-DUR,KLOR-CON) 20 MEQ  tablet Take 1 tablet (20 mEq total) by mouth daily. 05/09/15  Yes Yvonne R Lowne Chase, DO  temazepam (RESTORIL) 30 MG capsule TAKE ONE CAPSULE BY MOUTH AT BEDTIME AS NEEDED FOR SLEEP Patient taking differently: TAKE ONE CAPSULE BY MOUTH AT BEDTIME FOR SLEEP 07/16/15  Yes Yvonne R Lowne Chase, DO  ALPRAZolam Duanne Moron) 0.5 MG tablet TAKE ONE TABLET BY MOUTH THREE TIMES DAILY AS NEEDED FOR ANXIETY Patient not taking: Reported on 07/23/2015 07/22/15   Rosalita Chessman Chase, DO  doxycycline (VIBRA-TABS) 100 MG tablet Take 1 tablet (100 mg total) by mouth 2 (two) times daily. X 7days Patient not taking: Reported on 07/23/2015 07/22/15   Ripudeep Krystal Eaton, MD  furosemide (LASIX) 20 MG tablet Take 1 tablet (20 mg total) by mouth daily. Patient not taking: Reported on 07/23/2015 07/22/15   Ripudeep K Rai, MD  ipratropium-albuterol (DUONEB) 0.5-2.5 (3) MG/3ML SOLN Take 3 mLs by nebulization every 4 (four) hours as needed. Take Nebs three times a day for this week, after that continue as needed for shortness of breath or wheezing Patient not taking: Reported on 07/23/2015 07/22/15   Ripudeep Krystal Eaton, MD  predniSONE (DELTASONE) 10 MG tablet Prednisone dosing: Take  Prednisone 40mg  (4 tabs) x 2 days, then taper to 30mg  (3 tabs) x 3 days, then 20mg  (2 tabs) x 3days, then 10mg  (1 tab) x 3days, then OFF.  Dispense:  26 tabs, refills: None Patient not taking: Reported on 07/23/2015 07/23/15   Ripudeep Krystal Eaton, MD    Physical Exam: Filed Vitals:   07/23/15 0215 07/23/15 0230 07/23/15 0326 07/23/15 0336  BP: 82/52 98/72 91/41    Pulse: 112 121 115   Temp:    97.8 F (36.6 C)  TempSrc:    Oral  Resp: 24 28 26    SpO2: 99% 92% 97%       Constitutional: NAD, calm, comfortable Filed Vitals:   07/23/15 0215 07/23/15 0230 07/23/15 0326 07/23/15 0336  BP: 82/52 98/72 91/41    Pulse: 112 121 115   Temp:    97.8 F (36.6 C)  TempSrc:  Oral  Resp: 24 28 26    SpO2: 99% 92% 97%    Eyes: PERRL, lids and conjunctivae normal ENMT: Mucous  membranes are moist. Posterior pharynx clear of any exudate or lesions.Normal dentition.  Neck: normal, supple, no masses, no thyromegaly Respiratory: clear to auscultation bilaterally, no wheezing, no crackles. Normal respiratory effort. No accessory muscle use.  Cardiovascular: Regular rate and rhythm, no murmurs / rubs / gallops. No extremity edema. 2+ pedal pulses. No carotid bruits.  Abdomen: no tenderness, no masses palpated. No hepatosplenomegaly. Bowel sounds positive.  Musculoskeletal: no clubbing / cyanosis. No joint deformity upper and lower extremities. Good ROM, no contractures. Normal muscle tone.  Skin: no rashes, lesions, ulcers. No induration Neurologic: CN 2-12 grossly intact. Sensation intact, DTR normal. Strength 5/5 in all 4.  Psychiatric: Normal judgment and insight. Alert and oriented x 3. Normal mood.    Labs on Admission: I have personally reviewed following labs and imaging studies  CBC:  Recent Labs Lab 07/20/15 1811 07/20/15 2045 07/22/15 0427 07/23/15 0039 07/23/15 0047  WBC 10.5  --  10.9* 10.7*  --   NEUTROABS  --   --   --  8.1*  --   HGB 11.5*  --  11.0* 11.9* 13.6  HCT 35.9* 35.4* 33.2* 36.9* 40.0  MCV 102.3*  --  100.9* 100.3*  --   PLT 208  --  248 309  --    Basic Metabolic Panel:  Recent Labs Lab 07/20/15 1811 07/21/15 0317 07/22/15 0427 07/23/15 0039 07/23/15 0047  NA 140 140 141 139 141  K 4.0 4.4 4.5 5.5* 5.3*  CL 104 105 107 105 104  CO2 21* 26 27 27   --   GLUCOSE 158* 185* 149* 113* 112*  BUN 33* 31* 25* 22* 34*  CREATININE 2.13* 1.68* 1.23 1.27* 1.30*  CALCIUM 9.5 9.3 9.5 9.5  --    GFR: Estimated Creatinine Clearance: 37.3 mL/min (by C-G formula based on Cr of 1.3). Liver Function Tests: No results for input(s): AST, ALT, ALKPHOS, BILITOT, PROT, ALBUMIN in the last 168 hours. No results for input(s): LIPASE, AMYLASE in the last 168 hours. No results for input(s): AMMONIA in the last 168 hours. Coagulation Profile: No  results for input(s): INR, PROTIME in the last 168 hours. Cardiac Enzymes: No results for input(s): CKTOTAL, CKMB, CKMBINDEX, TROPONINI in the last 168 hours. BNP (last 3 results) No results for input(s): PROBNP in the last 8760 hours. HbA1C: No results for input(s): HGBA1C in the last 72 hours. CBG:  Recent Labs Lab 07/22/15 0910  GLUCAP 170*   Lipid Profile: No results for input(s): CHOL, HDL, LDLCALC, TRIG, CHOLHDL, LDLDIRECT in the last 72 hours. Thyroid Function Tests: No results for input(s): TSH, T4TOTAL, FREET4, T3FREE, THYROIDAB in the last 72 hours. Anemia Panel:  Recent Labs  07/20/15 2045  VITAMINB12 382   Urine analysis:    Component Value Date/Time   COLORURINE YELLOW 11/16/2014 1058   APPEARANCEUR CLEAR 11/16/2014 1058   LABSPEC 1.015 11/16/2014 1058   PHURINE 6.5 11/16/2014 1058   GLUCOSEU NEGATIVE 11/16/2014 1058   HGBUR NEGATIVE 11/16/2014 1058   HGBUR negative 11/30/2008 0000   BILIRUBINUR neg 05/09/2015 1106   BILIRUBINUR NEGATIVE 11/16/2014 1058   KETONESUR NEGATIVE 11/16/2014 1058   PROTEINUR neg 05/09/2015 1106   PROTEINUR NEGATIVE 11/16/2014 1058   UROBILINOGEN 0.2 05/09/2015 1106   UROBILINOGEN 0.2 11/16/2014 1058   NITRITE neg 05/09/2015 1106   NITRITE NEGATIVE 11/16/2014 1058   LEUKOCYTESUR Negative 05/09/2015 1106  Sepsis Labs: @LABRCNTIP (procalcitonin:4,lacticidven:4) )No results found for this or any previous visit (from the past 240 hour(s)).   Radiological Exams on Admission: Ct Angio Chest Pe W/cm &/or Wo Cm  07/23/2015  CLINICAL DATA:  Sudden worsening of shortness of breath. Recent hospitalization for COPD. EXAM: CT ANGIOGRAPHY CHEST WITH CONTRAST TECHNIQUE: Multidetector CT imaging of the chest was performed using the standard protocol during bolus administration of intravenous contrast. Multiplanar CT image reconstructions and MIPs were obtained to evaluate the vascular anatomy. CONTRAST:  100 mL Isovue 370 COMPARISON:   05/21/2015 FINDINGS: Technically adequate study with good opacification of the central and segmental pulmonary arteries. No focal filling defects demonstrated. No evidence of significant pulmonary embolus. Normal heart size. Normal caliber thoracic aorta. Great vessel origins are patent. No aortic dissection. Calcification of the aorta and coronary arteries. Postoperative changes in the aortic valve. Esophagus is decompressed. No significant lymphadenopathy in the chest. Multiple bilateral calcified pleural plaques. Emphysematous changes in the lungs. Focal scarring or spiculation in the right upper lung, measuring about 5 x 9 mm. No significant changes since previous study dated 03/10/2013 suggesting benign etiology. Focal atelectasis in the lung bases. No pleural effusions. No pneumothorax. Included portions of the upper abdominal organs are grossly unremarkable. Degenerative changes in the spine. No destructive bone lesions. Review of the MIP images confirms the above findings. IMPRESSION: No evidence of significant pulmonary embolus. Stable chronic changes in the lungs with diffuse emphysematous change, fibrosis, and calcified pleural plaques. Electronically Signed   By: Lucienne Capers M.D.   On: 07/23/2015 03:38   Dg Chest Port 1 View  07/23/2015  CLINICAL DATA:  Sudden worsening of shortness of breath, cough, and CHF. EXAM: PORTABLE CHEST 1 VIEW COMPARISON:  07/20/2015 FINDINGS: Postoperative changes in the heart and in the left lower rib. Heart size and pulmonary vascularity are normal. Emphysematous changes in the lungs with scattered fibrosis. Multiple calcified pleural plaques bilaterally. No focal airspace disease or consolidation. Linear scarring in the left mid lung. No blunting of costophrenic angles. No pneumothorax. Mediastinal contours appear intact. Calcified and tortuous aorta. IMPRESSION: Stable appearance of chronic changes in the chest, including emphysematous changes, fibrosis, calcified  pleural plaques, and scarring in the left mid lung. Normal heart size. Electronically Signed   By: Lucienne Capers M.D.   On: 07/23/2015 01:35    EKG: Independently reviewed.  Assessment/Plan Principal Problem:   Acute exacerbation of chronic obstructive pulmonary disease (COPD) ([place]) Active Problems:   Essential hypertension   COPD GOLD III with min reversibilty    COPD exacerbation (HCC)   S/P TAVR (transcatheter aortic valve replacement)   Acute on chronic respiratory failure (HCC)    Acute exacerbation of COPD -  Adult wheeze protocol  Prednisone PO  Brovana  Duonebs  Pulmicort  Doxycycline  HTN - continue home meds, make sure to measure BPs from his R arm only due to longstanding history of artery stenosis in his L arm which will give false low BP readings  H/o PAF - not on AC due to recurrent GIB in past  Chronic Diastolic CHF -  Continue home lasix which had been discontinued during last admission     DVT prophylaxis: Lovenox Code Status: Full Family Communication: None Consults called: None Admission status: Inpatient   Etta Quill DO [facility] Pager [phone] from 7PM-7AM  If 7AM-7PM, please contact the day physician for the patient [email or web address] Password [email or web address]  07/23/2015, 4:52 AM

## 2015-07-23 NOTE — ED Notes (Signed)
BP checked in patient's right arm instead of left arm. Reading found to be consistent with patient's well appearance @ 115/65. MD Little informed of finding.

## 2015-07-23 NOTE — ED Provider Notes (Addendum)
CSN: [id number]     Arrival date & time    History  By signing my name below, I, Hansel Feinstein, attest that this documentation has been prepared under the direction and in the presence of Sharlett Iles, MD. Electronically Signed: Hansel Feinstein, ED Scribe. 07/23/2015. 12:39 AM.    No chief complaint on file.  LEVEL 5 CAVEAT: HPI and ROS limited due to respiratory distress    The history is provided by the patient and the EMS personnel. The history is limited by the condition of the patient. No language interpreter was used.    HPI Comments: KORDALE OBA is a 75 y.o. male brought in by ambulance with h/o HTN, HLD, CAD, COPD on 2L home O2 at night, CHF, Afib who presents to the Emergency Department on CPAP complaining of moderate, sudden onset SOB onset tonight with associated dry cough. No chest pain. Pt was dc from the hospital this evening for COPD exacerbation with doxycycline, Duonebs, albuterol and BEVESPI inhalers, prednisone taper. Per EMS, O2 sats 76% on arrival and BP 146/96. He was administered Solumedrol and Duoneb en route. No MAG was given. Pt is a former smoker. Pt is not taking any anticoagulants.   Past Medical History  Diagnosis Date  . Polyp of nasal cavity   . Rosacea   . Hx of adenomatous colonic polyps 2012, 2013.   . Tobacco abuse   . Hypertension   . Hyperlipidemia   . GERD (gastroesophageal reflux disease)   . GI bleed 2010    4 units PRBCs  . Peripheral vascular disease ([place])   . Irregular heartbeat   . Shingles   . Carotid artery occlusion   . Angiodysplasia of intestine with hemorrhage     large and SB, gastric AVMs.   . Pleural plaque with presence of asbestos 03/27/2013    Followed in Pulmonary clinic/ [facility]/ [place] - F/u CT 09/08/2013 1. Stable extensive calcified pleural plaque formation consistent with asbestos exposure. 2. Multiple pulmonary nodules are unchanged from the CT of 6 months ago. Given risk factors for lung cancer, continued  follow up is recommended with chest CT in 6 months> done 04/20/14 no change >repeat in 12 m in tickle file     . GERD 11/30/2008    Qualifier: Diagnosis of  By: Marijean Niemann CMA, Danielle    . PVD (peripheral vascular disease) ([place]) 10/18/2012  . Carotid artery stenosis 04/22/2012  . Coronary artery disease   . COPD GOLD III with min reversibilty  08/11/2006    Followed in Pulmonary clinic/ [facility]/ [place] - PFT's 04/28/2013  FEV1 0.88 (40%) with ratio 44 and 14% better p B2 dlco 45 corrects to 83 - Trial of breo 04/28/2013 > improved symptoms  06/09/2013  - spirometry 06/04/2014 FEV1  0.76 (29%) ratio 45      . Iron deficiency anemia 01/25/2009    Qualifier: Diagnosis of  By: Henrene Pastor MD, Docia Chuck   . History of blood transfusion "couple times"    "related to bleeding in colon and esophagus"  . Arthritis     "left shoulder" (10/19/2014)  . Anxiety   . 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 Iliac artery stent Left 2005    CIA  . Esophagogastroduodenoscopy  2012    normal  . Colonoscopy  July 2015    Dr.  Henrene Pastor  . Tonsillectomy    . Knee arthroscopy with medial menisectomy Left 03/08/2014    Procedure: LEFT KNEE SCOPE WITH MEDIAL MENISECTOMY AND CHONDROPLASTY;  Surgeon: Ninetta Lights, MD;  Location: [place];  Service: Orthopedics;  Laterality: Left;  . Left and right heart catheterization with coronary angiogram N/A 06/28/2014    Procedure: LEFT AND RIGHT HEART CATHETERIZATION WITH CORONARY ANGIOGRAM;  Surgeon: Jerline Pain, MD;  Location: [facility] CATH LAB;  Service: Cardiovascular;  Laterality: N/A;  . Esophagogastroduodenoscopy N/A 08/17/2014    Procedure: ESOPHAGOGASTRODUODENOSCOPY (EGD);  Surgeon: Jerene Bears, MD;  Location: [facility] ENDOSCOPY;  Service: Endoscopy;  Laterality: N/A;  . Colonoscopy N/A 08/17/2014    Procedure: COLONOSCOPY;  Surgeon:  Jerene Bears, MD;  Location: [facility] ENDOSCOPY;  Service: Endoscopy;  Laterality: N/A;  . Cardiac catheterization  2001; 06/28/2014  . Cardiac catheterization N/A 10/19/2014    Procedure: Coronary Stent Intervention;  Surgeon: Burnell Blanks, MD;  Location: [place] CV LAB;  Service: Cardiovascular;  Laterality: N/A;  BMS Mid RCA  . Transcatheter aortic valve replacement, transapical N/A 11/20/2014    Procedure: TRANSCATHETER AORTIC VALVE REPLACEMENT, TRANSAPICAL;  Surgeon: [name], MD;  Location: [place];  Service: Open Heart Surgery;  Laterality: N/A;  . Tee without cardioversion N/A 11/20/2014    Procedure: TRANSESOPHAGEAL ECHOCARDIOGRAM (TEE);  Surgeon: [name], MD;  Location: [place];  Service: Open Heart Surgery;  Laterality: N/A;  . Rib plating Left 11/20/2014    Procedure: RIB PLATING OF LEFT 8TH RIB;  Surgeon: [name], MD;  Location: [place];  Service: Open Heart Surgery;  Laterality: Left;  . Colonoscopy N/A 12/05/2014    Procedure: COLONOSCOPY;  Surgeon: Manus Gunning, MD;  Location: [place];  Service: Gastroenterology;  Laterality: N/A;  . Enteroscopy N/A 12/05/2014    Procedure: ENTEROSCOPY;  Surgeon: Manus Gunning, MD;  Location: [place];  Service: Gastroenterology;  Laterality: N/A;   Family History  Problem Relation Age of Onset  . Lung disease Mother     pulm fibrosis  . Colitis Father   . Colon cancer Neg Hx   . Heart disease Brother   . Hypertension Brother   . Hyperlipidemia Brother   . CAD Daughter     cad  . Hypertension Son    Social History  Substance Use Topics  . Smoking status: Former Smoker -- 1.00 packs/day for 60 years    Types: Cigarettes, E-cigarettes    Quit date: 04/18/2015  . Smokeless tobacco: Never Used     Comment: Patient is using the patch  . Alcohol Use: 8.4 oz/week    14 Glasses of wine per week    Review of Systems  Unable to perform ROS: Severe respiratory distress     Allergies   Review of patient's allergies indicates no known allergies.  Home Medications   Prior to Admission medications   Medication Sig Start Date End Date Taking? Authorizing Provider  albuterol (PROVENTIL HFA;VENTOLIN HFA) 108 (90 Base) MCG/ACT inhaler Inhale 2 puffs into the lungs every 4 (four) hours as needed for wheezing or shortness of breath. 05/23/15   Rosalita Chessman Chase, DO  ALPRAZolam Duanne Moron) 0.5 MG tablet TAKE ONE TABLET BY MOUTH THREE TIMES DAILY AS NEEDED FOR ANXIETY 07/22/15   Alferd Apa Lowne Chase, DO  Ascorbic Acid (VITAMIN C) 250 MG tablet Take 500 mg by mouth daily.     Historical Provider, MD  aspirin 81 MG tablet Take 81 mg  by mouth daily.    Historical Provider, MD  atorvastatin (LIPITOR) 40 MG tablet TAKE ONE TABLET BY MOUTH ONCE DAILY 02/12/15   Alferd Apa Lowne Chase, DO  citalopram (CELEXA) 10 MG tablet Take 1 tablet (10 mg total) by mouth daily. 05/09/15   Rosalita Chessman Chase, DO  diclofenac sodium (VOLTAREN) 1 % GEL Apply 2 g topically 4 (four) times daily. Patient taking differently: Apply 2 g topically 4 (four) times daily as needed (for pain).  04/25/15   Rosalita Chessman Chase, DO  docusate sodium (COLACE) 100 MG capsule Take 100 mg by mouth 2 (two) times daily.     Historical Provider, MD  doxycycline (VIBRA-TABS) 100 MG tablet Take 1 tablet (100 mg total) by mouth 2 (two) times daily. X 7days 07/22/15   Ripudeep Krystal Eaton, MD  famotidine (PEPCID) 20 MG tablet Take 20 mg by mouth at bedtime.     Historical Provider, MD  fenofibrate 160 MG tablet Take 1 tablet (160 mg total) by mouth daily. 05/15/15   Rosalita Chessman Chase, DO  ferrous sulfate 325 (65 FE) MG EC tablet Take 1 tablet (325 mg total) by mouth 2 (two) times daily. 05/09/15   Rosalita Chessman Chase, DO  furosemide (LASIX) 20 MG tablet Take 1 tablet (20 mg total) by mouth daily. 07/22/15   Ripudeep Krystal Eaton, MD  Glycopyrrolate-Formoterol (BEVESPI AEROSPHERE) 9-4.8 MCG/ACT AERO Inhale 2 puffs into the lungs 2 (two) times daily. 05/24/15    Tanda Rockers, MD  ipratropium-albuterol (DUONEB) 0.5-2.5 (3) MG/3ML SOLN Take 3 mLs by nebulization every 4 (four) hours as needed. Take Nebs three times a day for this week, after that continue as needed for shortness of breath or wheezing 07/22/15   Ripudeep Krystal Eaton, MD  metoprolol tartrate (LOPRESSOR) 25 MG tablet Take 0.5 tablets (12.5 mg total) by mouth 2 (two) times daily. 05/09/15   Rosalita Chessman Chase, DO  Multiple Vitamins-Minerals (CENTRUM SILVER PO) Take 1 tablet by mouth daily.     Historical Provider, MD  OXYGEN Inhale 2 L into the lungs at bedtime.     Historical Provider, MD  pantoprazole (PROTONIX) 40 MG tablet Take 1 tablet (40 mg total) by mouth daily. 05/09/15   Rosalita Chessman Chase, DO  potassium chloride SA (K-DUR,KLOR-CON) 20 MEQ tablet Take 1 tablet (20 mEq total) by mouth daily. 05/09/15   Rosalita Chessman Chase, DO  predniSONE (DELTASONE) 10 MG tablet Prednisone dosing: Take  Prednisone 40mg  (4 tabs) x 2 days, then taper to 30mg  (3 tabs) x 3 days, then 20mg  (2 tabs) x 3days, then 10mg  (1 tab) x 3days, then OFF.  Dispense:  26 tabs, refills: None 07/23/15   Ripudeep K Rai, MD  temazepam (RESTORIL) 30 MG capsule TAKE ONE CAPSULE BY MOUTH AT BEDTIME AS NEEDED FOR SLEEP Patient taking differently: TAKE ONE CAPSULE BY MOUTH AT BEDTIME FOR SLEEP 07/16/15   Yvonne R Lowne Chase, DO   SpO2 76% Physical Exam  Constitutional: He is oriented to person, place, and time. He appears well-developed and well-nourished. He appears distressed.  HENT:  Head: Normocephalic and atraumatic.  Moist mucous membranes  Eyes: Conjunctivae are normal. Pupils are equal, round, and reactive to light.  Neck: Neck supple.  Cardiovascular: Regular rhythm and normal heart sounds.  Tachycardia present.   No murmur heard. Pulmonary/Chest: He is in respiratory distress.  Tachypnea w/ Severely diminished BS b/l, faint expiratory wheezes and prolonged expiratory phase  Abdominal: Soft. Bowel sounds  are normal. He  exhibits no distension. There is no tenderness.  Musculoskeletal:  L foot w/ mild edema and old ecchymoses, non-tender  Neurological: He is alert and oriented to person, place, and time.  Fluent speech  Skin: Skin is warm. He is diaphoretic.  Old ecchymoses of L foot and toes  Psychiatric: He has a normal mood and affect. Judgment normal.  Nursing note and vitals reviewed.   ED Course  .Critical Care Performed by: Sharlett Iles Authorized by: Sharlett Iles Total critical care time: 60 minutes Critical care was necessary to treat or prevent imminent or life-threatening deterioration of the following conditions: respiratory failure. Critical care was time spent personally by me on the following activities: development of treatment plan with patient or surrogate, evaluation of patient's response to treatment, examination of patient, obtaining history from patient or surrogate, ordering and performing treatments and interventions, ordering and review of laboratory studies, ordering and review of radiographic studies, pulse oximetry, re-evaluation of patient's condition and review of old charts.   (including critical care time) DIAGNOSTIC STUDIES: Oxygen Saturation is 100% on BIPAP, normal by my interpretation.    COORDINATION OF CARE: 12:37 AM Discussed treatment plan with pt at bedside which includes lab work, EKG and pt agreed to plan.   Labs Review Labs Reviewed  BASIC METABOLIC PANEL - Abnormal; Notable for the following:    Potassium 5.5 (*)    Glucose, Bld 113 (*)    BUN 22 (*)    Creatinine, Ser 1.27 (*)    GFR calc non Af Amer 53 (*)    All other components within normal limits  BRAIN NATRIURETIC PEPTIDE - Abnormal; Notable for the following:    B Natriuretic Peptide 259.6 (*)    All other components within normal limits  CBC WITH DIFFERENTIAL/PLATELET - Abnormal; Notable for the following:    WBC 10.7 (*)    RBC 3.68 (*)    Hemoglobin 11.9 (*)    HCT  36.9 (*)    MCV 100.3 (*)    Neutro Abs 8.1 (*)    Monocytes Absolute 1.1 (*)    All other components within normal limits  I-STAT TROPOININ, ED - Abnormal; Notable for the following:    Troponin i, poc 0.09 (*)    All other components within normal limits  I-STAT CHEM 8, ED - Abnormal; Notable for the following:    Potassium 5.3 (*)    BUN 34 (*)    Creatinine, Ser 1.30 (*)    Glucose, Bld 112 (*)    All other components within normal limits  I-STAT VENOUS BLOOD GAS, ED - Abnormal; Notable for the following:    pH, Ven 7.305 (*)    pCO2, Ven 64.7 (*)    Bicarbonate 32.2 (*)    Acid-Base Excess 4.0 (*)    All other components within normal limits  BLOOD GAS, VENOUS  I-STAT CG4 LACTIC ACID, ED    Imaging Review Ct Angio Chest Pe W/cm &/or Wo Cm  07/23/2015  CLINICAL DATA:  Sudden worsening of shortness of breath. Recent hospitalization for COPD. EXAM: CT ANGIOGRAPHY CHEST WITH CONTRAST TECHNIQUE: Multidetector CT imaging of the chest was performed using the standard protocol during bolus administration of intravenous contrast. Multiplanar CT image reconstructions and MIPs were obtained to evaluate the vascular anatomy. CONTRAST:  100 mL Isovue 370 COMPARISON:  05/21/2015 FINDINGS: Technically adequate study with good opacification of the central and segmental pulmonary arteries. No focal filling defects demonstrated. No evidence of  significant pulmonary embolus. Normal heart size. Normal caliber thoracic aorta. Great vessel origins are patent. No aortic dissection. Calcification of the aorta and coronary arteries. Postoperative changes in the aortic valve. Esophagus is decompressed. No significant lymphadenopathy in the chest. Multiple bilateral calcified pleural plaques. Emphysematous changes in the lungs. Focal scarring or spiculation in the right upper lung, measuring about 5 x 9 mm. No significant changes since previous study dated 03/10/2013 suggesting benign etiology. Focal atelectasis  in the lung bases. No pleural effusions. No pneumothorax. Included portions of the upper abdominal organs are grossly unremarkable. Degenerative changes in the spine. No destructive bone lesions. Review of the MIP images confirms the above findings. IMPRESSION: No evidence of significant pulmonary embolus. Stable chronic changes in the lungs with diffuse emphysematous change, fibrosis, and calcified pleural plaques. Electronically Signed   By: Lucienne Capers M.D.   On: 07/23/2015 03:38   Dg Chest Port 1 View  07/23/2015  CLINICAL DATA:  Sudden worsening of shortness of breath, cough, and CHF. EXAM: PORTABLE CHEST 1 VIEW COMPARISON:  07/20/2015 FINDINGS: Postoperative changes in the heart and in the left lower rib. Heart size and pulmonary vascularity are normal. Emphysematous changes in the lungs with scattered fibrosis. Multiple calcified pleural plaques bilaterally. No focal airspace disease or consolidation. Linear scarring in the left mid lung. No blunting of costophrenic angles. No pneumothorax. Mediastinal contours appear intact. Calcified and tortuous aorta. IMPRESSION: Stable appearance of chronic changes in the chest, including emphysematous changes, fibrosis, calcified pleural plaques, and scarring in the left mid lung. Normal heart size. Electronically Signed   By: Lucienne Capers M.D.   On: 07/23/2015 01:35   I have personally reviewed and evaluated these lab results as part of my medical decision-making.   EKG Interpretation   Date/Time:  Tuesday Jul 23 2015 00:37:13 EDT Ventricular Rate:  131 PR Interval:  153 QRS Duration: 83 QT Interval:  292 QTC Calculation: 431 R Axis:   39 Text Interpretation:  Sinus tachycardia Anterior infarct, old Nonspecific  T abnormalities, lateral leads ST depressions and T wave inversions on  previous EKG have improved on current EKG Confirmed by LITTLE MD, RACHEL  [phone]) on 07/23/2015 2:53:51 AM      Medications  magnesium sulfate IVPB 2 g 50 mL  (not administered)  albuterol (PROVENTIL,VENTOLIN) solution continuous neb (not administered)     MDM   Final diagnoses:  Acute exacerbation of chronic obstructive pulmonary disease (COPD) (HCC)  Acute on chronic respiratory failure with hypoxia and hypercapnia (HCC)   Pt w/ COPD and CHF, discharged From hospital earlier today, resent for sudden worsening of shortness of breath while at home. On arrival by EMS, the patient was severely dyspneic in respiratory distress, on BiPAP with O2 sat 94%. Initial blood pressure 82/65 in left arm. The patient denied any chest pain. Gave continuous albuterol and magnesium and obtained chest x-ray which showed no acute infiltrate or volume overload. EKG without obvious ischemic changes compared to previous. Obtained above lab work which showed troponin 0.09, BNP 259, creatinine 1.27, potassium 5.5 which was hemolyzed. WBC 10.7. Because of the patient's recent hospitalization and sudden worsening of his breathing, obtained CTA of the chest to rule out PE. CT was negative for acute process.    Of note, pt continued to have low BP readings on left arm. Switched cuff to R arm and had significant difference in BP; consistently normotensive on R arm. Pt later noted that he was told he has a blockage on his  left arm. Normotension is c/w his improved clinical picture, improved HR, normal lactate, and normal mentation. On reexamination, the patient was improved, breathing comfortably on O2 [place]. Discussed admission with [facility] hospitalist, Dr. Alcario Drought, and pt admitted for further care.  I personally performed the services described in this documentation, which was scribed in my presence. The recorded information has been reviewed and is accurate.   Sharlett Iles, MD 07/23/15 [place], MD 07/23/15 [phone]

## 2015-07-23 NOTE — Progress Notes (Signed)
CAT completed

## 2015-07-23 NOTE — ED Notes (Signed)
Repaged admitting to [facility], [place].

## 2015-07-23 NOTE — ED Notes (Signed)
Patient denies pain and paresthesia. States "I have a blockage in my left arm".

## 2015-07-23 NOTE — ED Notes (Signed)
Ordered diet tray 

## 2015-07-23 NOTE — ED Notes (Signed)
Breakfast tray ordered 

## 2015-07-23 NOTE — ED Notes (Signed)
Pt to ED from home by EMS c/o shortness of breath with sudden onset tonight. Pt discharged this afternoon. Initial sats 76%, albuterol 5mg , atrovent 0.5mg  and 125mg  solumedrol given enroute. Pt on CPAP on arrival, tachypneic and tachycardic. Pt is usually on 2L home oxygen at night. Productive cough noted

## 2015-07-24 ENCOUNTER — Other Ambulatory Visit: Payer: Self-pay

## 2015-07-24 LAB — CBC
HCT: 32.8 % — ABNORMAL LOW (ref 39.0–52.0)
Hemoglobin: 10.7 g/dL — ABNORMAL LOW (ref 13.0–17.0)
MCH: 32.4 pg (ref 26.0–34.0)
MCHC: 32.6 g/dL (ref 30.0–36.0)
MCV: 99.4 fL (ref 78.0–100.0)
PLATELETS: 230 10*3/uL (ref 150–400)
RBC: 3.3 MIL/uL — ABNORMAL LOW (ref 4.22–5.81)
RDW: 12.9 % (ref 11.5–15.5)
WBC: 8.8 10*3/uL (ref 4.0–10.5)

## 2015-07-24 LAB — BASIC METABOLIC PANEL
Anion gap: 6 (ref 5–15)
BUN: 21 mg/dL — AB (ref 6–20)
CALCIUM: 9.2 mg/dL (ref 8.9–10.3)
CHLORIDE: 107 mmol/L (ref 101–111)
CO2: 29 mmol/L (ref 22–32)
CREATININE: 1.07 mg/dL (ref 0.61–1.24)
GFR calc non Af Amer: >60 mL/min (ref >60)
GLUCOSE: 101 mg/dL — AB (ref 65–99)
Potassium: 4.2 mmol/L (ref 3.5–5.1)
Sodium: 142 mmol/L (ref 135–145)

## 2015-07-24 MED ORDER — BENZONATATE 100 MG PO CAPS
100.0000 mg | ORAL_CAPSULE | Freq: Three times a day (TID) | ORAL | Status: DC
  Administered 2015-07-24 – 2015-07-27 (×10): 100 mg via ORAL
  Filled 2015-07-24 (×10): qty 1

## 2015-07-24 NOTE — Progress Notes (Signed)
Patient ambulated 350 ft on 2L at 96% and tolerated it well.  He ambulated 100 ft on room air and dropped to 90%.

## 2015-07-24 NOTE — Telephone Encounter (Signed)
Error.     KP 

## 2015-07-24 NOTE — Progress Notes (Signed)
[facility]                                                                              Patient Demographics  Ronald Bell, is a 75 y.o. male, DOB - [birth year], [id number]  Admit date - 07/23/2015   Admitting Physician Etta Quill, DO  Outpatient Primary MD for the patient is Ann Held, DO  Outpatient specialists:   LOS - 1  days    Chief Complaint  Patient presents with  . Respiratory Distress       Brief summary   Ronald Bell is a 75 y.o. male with medical history significant of COPD, just discharged from our service On 5/1 after an admission for acute COPD exacerbation. Patient is on 2L home O2 at night. Patient presented to the ED on CPAP with c/o sudden onset of SOB on the night of admission with associated dry cough. Per EMS O2 sats were 76% on arrival. Patient was started on Solumedrol and duoneb en route to ED.   Assessment & Plan    Acute on chronic hypoxic and hypercapnic respiratory failure due to COPD exacerbation - wheezing is improving - CT angiogram negative for pulmonary embolus, chronic changes of diffuse emphysematous changes, fibrosis and calcified pleural plaques. - continue scheduled nebs, Brovana, Pulmicort, doxycycline. 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 arformoterol  15 mcg Nebulization BID  . aspirin EC  81 mg Oral Daily  . atorvastatin  40 mg Oral Daily  . benzonatate  100 mg Oral TID  . budesonide  0.25 mg Inhalation BID  . citalopram  10 mg Oral Daily  . docusate sodium  100 mg Oral BID  . doxycycline  100 mg Oral BID  . enoxaparin (LOVENOX) injection  40 mg Subcutaneous Daily  . famotidine  20 mg Oral QHS  . fenofibrate  160 mg Oral Daily  . ferrous sulfate  325 mg Oral BID WC  . furosemide  20 mg Oral Daily  . ipratropium-albuterol  3 mL Nebulization QID  . metoprolol tartrate  12.5 mg Oral BID  . pantoprazole  40 mg Oral Daily  . predniSONE  40 mg Oral Q breakfast  . sodium polystyrene  30 g Oral Once   Continuous Infusions:  PRN Meds:.albuterol, diclofenac sodium, temazepam   Antibiotics   Anti-infectives    Start     Dose/Rate Route Frequency Ordered Stop   07/23/15 1000  doxycycline (VIBRA-TABS) tablet 100 mg     100 mg Oral 2 times daily 07/23/15 0449          Subjective:   Ronald Bell was seen and examined today. Feeling a lot better today, no fevers or chills. Wheezing has significantly improved. Patient denies dizziness, chest pain, abdominal pain, N/V/D/C, new weakness, numbess, tingling.   Objective:   Filed Vitals:   07/23/15 2055 07/24/15 0436 07/24/15 0759 07/24/15 1023  BP: 157/71 137/90  133/62  Pulse: 101 75 79 81  Temp: 97.7 F (36.5 C) 97.6 F (36.4 C)    TempSrc: Oral Oral    Resp: 21 20 20    SpO2: 98% 100% 98%     Intake/Output Summary (Last 24 hours) at 07/24/15 1042 Last data filed at 07/24/15 0923  Gross per 24 hour  Intake    120 ml  Output       0 ml  Net    120 ml     Wt Readings from Last 3 Encounters:  07/22/15 56.9 kg (125 lb 7.1 oz)  05/27/15 61.78 kg (136 lb 3.2 oz)  05/24/15 56.609 kg (124 lb 12.8 oz)     Exam  General: Alert and oriented x 3, NAD  HEENT:    Neck:   CVS: S1 S2 auscultated, no rubs, murmurs or gallops. Regular rate and rhythm.  Respiratory:Wheezing significantly improved  Abdomen: Soft, nontender, nondistended, + bowel sounds  Ext: no cyanosis clubbing or edema, somewhat tremulous   Neuro: no new deficits  Skin: No rashes  Psych: Normal affect and demeanor, alert and oriented x3    Data Reviewed:  I have personally reviewed following labs and imaging studies  Micro Results No results found for this or any previous visit (from the past 240 hour(s)).  Radiology Reports Dg Chest 2 View  07/20/2015  CLINICAL DATA:  Fall at home. EXAM: CHEST  2 VIEW COMPARISON:  May 21, 2015. FINDINGS: Stable cardiomediastinal silhouette. Status post aortic valve replacement. Stable bilateral calcified pleural plaques are noted. Atherosclerosis of abdominal aorta is noted. No pneumothorax or pleural effusion is noted. Stable left midlung scarring is noted. No acute pulmonary disease is noted. Atherosclerosis of thoracic aorta is noted. IMPRESSION: Stable calcified pleural plaques. Stable left midlung scarring. No acute cardiopulmonary abnormality seen. Electronically Signed   By: Marijo Conception, M.D.   On: 07/20/2015 18:27   Ct Angio Chest Pe W/cm &/or Wo Cm  07/23/2015  CLINICAL DATA:  Sudden worsening of shortness of breath. Recent hospitalization for COPD. EXAM: CT ANGIOGRAPHY CHEST WITH CONTRAST TECHNIQUE: Multidetector CT imaging of the chest was performed using the standard protocol during bolus administration of intravenous contrast. Multiplanar CT image reconstructions and MIPs were obtained to evaluate the vascular anatomy. CONTRAST:  100 mL Isovue 370 COMPARISON:  05/21/2015 FINDINGS:  Technically adequate study with good opacification of the central and segmental pulmonary arteries. No focal filling defects demonstrated. No evidence of significant pulmonary embolus. Normal heart size. Normal caliber thoracic aorta. Great vessel origins are patent. No aortic dissection. Calcification of the aorta and coronary arteries. Postoperative changes in the aortic valve. Esophagus is decompressed. No significant lymphadenopathy in the  chest. Multiple bilateral calcified pleural plaques. Emphysematous changes in the lungs. Focal scarring or spiculation in the right upper lung, measuring about 5 x 9 mm. No significant changes since previous study dated 03/10/2013 suggesting benign etiology. Focal atelectasis in the lung bases. No pleural effusions. No pneumothorax. Included portions of the upper abdominal organs are grossly unremarkable. Degenerative changes in the spine. No destructive bone lesions. Review of the MIP images confirms the above findings. IMPRESSION: No evidence of significant pulmonary embolus. Stable chronic changes in the lungs with diffuse emphysematous change, fibrosis, and calcified pleural plaques. Electronically Signed   By: Lucienne Capers M.D.   On: 07/23/2015 03:38   Dg Chest Port 1 View  07/23/2015  CLINICAL DATA:  Sudden worsening of shortness of breath, cough, and CHF. EXAM: PORTABLE CHEST 1 VIEW COMPARISON:  07/20/2015 FINDINGS: Postoperative changes in the heart and in the left lower rib. Heart size and pulmonary vascularity are normal. Emphysematous changes in the lungs with scattered fibrosis. Multiple calcified pleural plaques bilaterally. No focal airspace disease or consolidation. Linear scarring in the left mid lung. No blunting of costophrenic angles. No pneumothorax. Mediastinal contours appear intact. Calcified and tortuous aorta. IMPRESSION: Stable appearance of chronic changes in the chest, including emphysematous changes, fibrosis, calcified pleural plaques, and  scarring in the left mid lung. Normal heart size. Electronically Signed   By: Lucienne Capers M.D.   On: 07/23/2015 01:35    CBC  Recent Labs Lab 07/20/15 1811 07/20/15 2045 07/22/15 0427 07/23/15 0039 07/23/15 0047 07/24/15 0228  WBC 10.5  --  10.9* 10.7*  --  8.8  HGB 11.5*  --  11.0* 11.9* 13.6 10.7*  HCT 35.9* 35.4* 33.2* 36.9* 40.0 32.8*  PLT 208  --  248 309  --  230  MCV 102.3*  --  100.9* 100.3*  --  99.4  MCH 32.8  --  33.4 32.3  --  32.4  MCHC 32.0  --  33.1 32.2  --  32.6  RDW 13.0  --  12.7 13.0  --  12.9  LYMPHSABS  --   --   --  1.4  --   --   MONOABS  --   --   --  1.1*  --   --   EOSABS  --   --   --  0.0  --   --   BASOSABS  --   --   --  0.0  --   --     Chemistries   Recent Labs Lab 07/21/15 0317 07/22/15 0427 07/23/15 0039 07/23/15 0047 07/23/15 1007 07/24/15 0228  NA 140 141 139 141 138 142  K 4.4 4.5 5.5* 5.3* 4.8 4.2  CL 105 107 105 104 106 107  CO2 26 27 27   --  23 29  GLUCOSE 185* 149* 113* 112* 135* 101*  BUN 31* 25* 22* 34* 19 21*  CREATININE 1.68* 1.23 1.27* 1.30* 1.04 1.07  CALCIUM 9.3 9.5 9.5  --  9.4 9.2   ------------------------------------------------------------------------------------------------------------------ estimated creatinine clearance is 45.4 mL/min (by C-G formula based on Cr of 1.07). ------------------------------------------------------------------------------------------------------------------ No results for input(s): HGBA1C in the last 72 hours. ------------------------------------------------------------------------------------------------------------------ No results for input(s): CHOL, HDL, LDLCALC, TRIG, CHOLHDL, LDLDIRECT in the last 72 hours. ------------------------------------------------------------------------------------------------------------------ No results for input(s): TSH, T4TOTAL, T3FREE, THYROIDAB in the last 72 hours.  Invalid input(s):  FREET3 ------------------------------------------------------------------------------------------------------------------ No results for input(s): VITAMINB12, FOLATE, FERRITIN, TIBC, IRON, RETICCTPCT in the last 72 hours.  Coagulation profile No results for input(s):  INR, PROTIME in the last 168 hours.  No results for input(s): DDIMER in the last 72 hours.  Cardiac Enzymes No results for input(s): CKMB, TROPONINI, MYOGLOBIN in the last 168 hours.  Invalid input(s): CK ------------------------------------------------------------------------------------------------------------------ Invalid input(s): POCBNP   Recent Labs  07/22/15 0910  GLUCAP 170*     RAI,RIPUDEEP M.D. [facility] 07/24/2015, 10:42 AM  Pager: [phone] Between 7am to 7pm - call Pager - [phone]  After 7pm go to [email or web address] - password [email or web address]  Call night coverage person covering after 7pm

## 2015-07-24 NOTE — Progress Notes (Signed)
Utilization review completed.  

## 2015-07-25 ENCOUNTER — Inpatient Hospital Stay (HOSPITAL_COMMUNITY): Payer: Medicare Other

## 2015-07-25 DIAGNOSIS — I509 Heart failure, unspecified: Secondary | ICD-10-CM

## 2015-07-25 LAB — BASIC METABOLIC PANEL
ANION GAP: 7 (ref 5–15)
BUN: 21 mg/dL — AB (ref 6–20)
CHLORIDE: 104 mmol/L (ref 101–111)
CO2: 30 mmol/L (ref 22–32)
Calcium: 9.4 mg/dL (ref 8.9–10.3)
Creatinine, Ser: 1.01 mg/dL (ref 0.61–1.24)
GFR calc Af Amer: >60 mL/min (ref >60)
GLUCOSE: 88 mg/dL (ref 65–99)
POTASSIUM: 3.8 mmol/L (ref 3.5–5.1)
Sodium: 141 mmol/L (ref 135–145)

## 2015-07-25 LAB — ECHOCARDIOGRAM COMPLETE

## 2015-07-25 NOTE — Progress Notes (Signed)
[facility]                                                                              Patient Demographics  Ronald Bell, is a 75 y.o. male, DOB - Jul 09, 1940, [id number]  Admit date - 07/23/2015   Admitting Physician Etta Quill, DO  Outpatient Primary MD for the patient is Ann Held, DO  Outpatient specialists:   LOS - 2  days    Chief Complaint  Patient presents with  . Respiratory Distress       Brief summary   Ronald Bell is a 75 y.o. male with medical history significant of COPD, just discharged from our service On 5/1 after an admission for acute COPD exacerbation. Patient is on 2L home O2 at night. Patient presented to the ED on CPAP with c/o sudden onset of SOB on the night of admission with associated dry cough. Per EMS O2 sats were 76% on arrival. Patient was started on Solumedrol and duoneb en route to ED.  Patient was readmitted for COPD exacerbation, progressively improving, hoping for DC home on 5/5   Assessment & Plan    Acute on chronic hypoxic and hypercapnic respiratory failure due to COPD exacerbation - wheezing Has significantly improved  - CT angiogram negative for pulmonary embolus, chronic changes of diffuse emphysematous changes, fibrosis and calcified pleural plaques. - continue scheduled nebs, Brovana, Pulmicort, doxycycline. Will continue Pulmicort and Brovana outpatient, as patient has significant improvement, dc Bevespi inhaler outpatient. - continue oral prednisone with taper outpatient - PTOT evaluation, home O2 evaluation - Scheduled pulmonology appointment for 5/12 at [facility], With Dr. [name] (in [place]).  - Patient still feels short of breath with minimal exertion, will obtain 2-D echocardiogram for further workup  CAD - Cath in July 2016 revealed 95% stenosis of mid RCA and BMS placed  - Continue ASA 81, Lipitor   Paroxysmal atrial fibrillation - In sinus rhythm on admission  - CHADS-VASc  is 6 (age x2, CHF, CAD, HTN), Not on AC due to recurrent GIB   Chronic diastolic CHF - shortness of breath on ambulation - Continue aspirin, Lipitor, Lasix 20 mg daily per his home dose - Update 2-D echo, previous echo 10/16 had shown EF of 60-65% with grade 1 diastolic dysfunction.  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  Time Spent in minutes   25 minutes  Procedures  CTA chest  Consults     DVT Prophylaxis  Lovenox   Medications  Scheduled Meds: . arformoterol  15 mcg Nebulization BID  . aspirin EC  81 mg Oral Daily  . atorvastatin  40 mg Oral Daily  . benzonatate  100 mg Oral TID  . budesonide  0.25 mg Inhalation BID  . citalopram  10 mg Oral Daily  .  docusate sodium  100 mg Oral BID  . doxycycline  100 mg Oral BID  . enoxaparin (LOVENOX) injection  40 mg Subcutaneous Daily  . famotidine  20 mg Oral QHS  . fenofibrate  160 mg Oral Daily  . ferrous sulfate  325 mg Oral BID WC  . furosemide  20 mg Oral Daily  . ipratropium-albuterol  3 mL Nebulization QID  . metoprolol tartrate  12.5 mg Oral BID  . pantoprazole  40 mg Oral Daily  . predniSONE  40 mg Oral Q breakfast  . sodium polystyrene  30 g Oral Once   Continuous Infusions:  PRN Meds:.albuterol, diclofenac sodium, temazepam   Antibiotics   Anti-infectives    Start     Dose/Rate Route Frequency Ordered Stop   07/23/15 1000  doxycycline (VIBRA-TABS) tablet 100 mg     100 mg Oral 2 times daily 07/23/15 0449          Subjective:   Ronald Bell was seen and examined today.Feeling better today, still feels short of  breath on walking.  no fevers or chills. Wheezing has significantly improved. Patient denies dizziness, chest pain, abdominal pain, N/V/D/C, new weakness, numbess, tingling.   Objective:   Filed Vitals:   07/24/15 2017 07/24/15 2034 07/25/15 0948 07/25/15 0952  BP: 119/63  133/85   Pulse: 91  85   Temp: 97.5 F (36.4 C)     TempSrc: Oral     Resp: 18     SpO2: 98% 98%  96%    Intake/Output Summary (Last 24 hours) at 07/25/15 1041 Last data filed at 07/24/15 1433  Gross per 24 hour  Intake    240 ml  Output      0 ml  Net    240 ml     Wt Readings from Last 3 Encounters:  07/22/15 56.9 kg (125 lb 7.1 oz)  05/27/15 61.78 kg (136 lb 3.2 oz)  05/24/15 56.609 kg (124 lb 12.8 oz)     Exam  General: Alert and oriented x 3, NAD  HEENT:    Neck:   CVS: S1 S2 clear, Regular rate and rhythm.  Respiratory: Fairly clear to auscultation bilaterally   Abdomen: Soft, nontender, nondistended, + bowel sounds  Ext: no cyanosis clubbing or edema, somewhat tremulous   Neuro: no new deficits  Skin: No rashes  Psych: Normal affect and demeanor, alert and oriented x3    Data Reviewed:  I have personally reviewed following labs and imaging studies  Micro Results No results found for this or any previous visit (from the past 240 hour(s)).  Radiology Reports Dg Chest 2 View  07/20/2015  CLINICAL DATA:  Fall at home. EXAM: CHEST  2 VIEW COMPARISON:  May 21, 2015. FINDINGS: Stable cardiomediastinal silhouette. Status post aortic valve replacement. Stable bilateral calcified pleural plaques are noted. Atherosclerosis of abdominal aorta is noted. No pneumothorax or pleural effusion is noted. Stable left midlung scarring is noted. No acute pulmonary disease is noted. Atherosclerosis of thoracic aorta is noted. IMPRESSION: Stable calcified pleural plaques. Stable left midlung scarring. No acute cardiopulmonary abnormality seen. Electronically Signed   By: Marijo Conception, M.D.   On:  07/20/2015 18:27   Ct Angio Chest Pe W/cm &/or Wo Cm  07/23/2015  CLINICAL DATA:  Sudden worsening of shortness of breath. Recent hospitalization for COPD. EXAM: CT ANGIOGRAPHY CHEST WITH CONTRAST TECHNIQUE: Multidetector CT imaging of the chest was performed using the standard protocol during bolus administration of intravenous contrast. Multiplanar CT  image reconstructions and MIPs were obtained to evaluate the vascular anatomy. CONTRAST:  100 mL Isovue 370 COMPARISON:  05/21/2015 FINDINGS: Technically adequate study with good opacification of the central and segmental pulmonary arteries. No focal filling defects demonstrated. No evidence of significant pulmonary embolus. Normal heart size. Normal caliber thoracic aorta. Great vessel origins are patent. No aortic dissection. Calcification of the aorta and coronary arteries. Postoperative changes in the aortic valve. Esophagus is decompressed. No significant lymphadenopathy in the chest. Multiple bilateral calcified pleural plaques. Emphysematous changes in the lungs. Focal scarring or spiculation in the right upper lung, measuring about 5 x 9 mm. No significant changes since previous study dated 03/10/2013 suggesting benign etiology. Focal atelectasis in the lung bases. No pleural effusions. No pneumothorax. Included portions of the upper abdominal organs are grossly unremarkable. Degenerative changes in the spine. No destructive bone lesions. Review of the MIP images confirms the above findings. IMPRESSION: No evidence of significant pulmonary embolus. Stable chronic changes in the lungs with diffuse emphysematous change, fibrosis, and calcified pleural plaques. Electronically Signed   By: Lucienne Capers M.D.   On: 07/23/2015 03:38   Dg Chest Port 1 View  07/23/2015  CLINICAL DATA:  Sudden worsening of shortness of breath, cough, and CHF. EXAM: PORTABLE CHEST 1 VIEW COMPARISON:  07/20/2015 FINDINGS: Postoperative changes in the heart and in the left lower  rib. Heart size and pulmonary vascularity are normal. Emphysematous changes in the lungs with scattered fibrosis. Multiple calcified pleural plaques bilaterally. No focal airspace disease or consolidation. Linear scarring in the left mid lung. No blunting of costophrenic angles. No pneumothorax. Mediastinal contours appear intact. Calcified and tortuous aorta. IMPRESSION: Stable appearance of chronic changes in the chest, including emphysematous changes, fibrosis, calcified pleural plaques, and scarring in the left mid lung. Normal heart size. Electronically Signed   By: Lucienne Capers M.D.   On: 07/23/2015 01:35    CBC  Recent Labs Lab 07/20/15 1811 07/20/15 2045 07/22/15 0427 07/23/15 0039 07/23/15 0047 07/24/15 0228  WBC 10.5  --  10.9* 10.7*  --  8.8  HGB 11.5*  --  11.0* 11.9* 13.6 10.7*  HCT 35.9* 35.4* 33.2* 36.9* 40.0 32.8*  PLT 208  --  248 309  --  230  MCV 102.3*  --  100.9* 100.3*  --  99.4  MCH 32.8  --  33.4 32.3  --  32.4  MCHC 32.0  --  33.1 32.2  --  32.6  RDW 13.0  --  12.7 13.0  --  12.9  LYMPHSABS  --   --   --  1.4  --   --   MONOABS  --   --   --  1.1*  --   --   EOSABS  --   --   --  0.0  --   --   BASOSABS  --   --   --  0.0  --   --     Chemistries   Recent Labs Lab 07/22/15 0427 07/23/15 0039 07/23/15 0047 07/23/15 1007 07/24/15 0228 07/25/15 0419  NA 141 139 141 138 142 141  K 4.5 5.5* 5.3* 4.8 4.2 3.8  CL 107 105 104 106 107 104  CO2 27 27  --  23 29 30   GLUCOSE 149* 113* 112* 135* 101* 88  BUN 25* 22* 34* 19 21* 21*  CREATININE 1.23 1.27* 1.30* 1.04 1.07 1.01  CALCIUM 9.5 9.5  --  9.4 9.2 9.4   ------------------------------------------------------------------------------------------------------------------ estimated creatinine  clearance is 48.1 mL/min (by C-G formula based on Cr of 1.01). ------------------------------------------------------------------------------------------------------------------ No results for input(s): HGBA1C in  the last 72 hours. ------------------------------------------------------------------------------------------------------------------ No results for input(s): CHOL, HDL, LDLCALC, TRIG, CHOLHDL, LDLDIRECT in the last 72 hours. ------------------------------------------------------------------------------------------------------------------ No results for input(s): TSH, T4TOTAL, T3FREE, THYROIDAB in the last 72 hours.  Invalid input(s): FREET3 ------------------------------------------------------------------------------------------------------------------ No results for input(s): VITAMINB12, FOLATE, FERRITIN, TIBC, IRON, RETICCTPCT in the last 72 hours.  Coagulation profile No results for input(s): INR, PROTIME in the last 168 hours.  No results for input(s): DDIMER in the last 72 hours.  Cardiac Enzymes No results for input(s): CKMB, TROPONINI, MYOGLOBIN in the last 168 hours.  Invalid input(s): CK ------------------------------------------------------------------------------------------------------------------ Invalid input(s): POCBNP  No results for input(s): GLUCAP in the last 72 hours.   Shasta Chinn M.D. [facility] 07/25/2015, 10:41 AM  Pager: [phone] Between 7am to 7pm - call Pager - [phone]  After 7pm go to [email or web address] - password [email or web address]  Call night coverage person covering after 7pm

## 2015-07-25 NOTE — Care Management Note (Signed)
Case Management Note [name] RN, BSN Unit 2W-Case Manager [phone]  Patient Details  Name: Ronald Bell MRN: [id number] Date of Birth: October 19, 1940  Subjective/Objective:      Pt admitted with COPD- was just d/c'd on 07/22/15 and readmitted               Action/Plan: Per previous CM note- [name] - Patient lives at home with spouse, has home oxygen, nebulizer machine; Pulmonologist is Dr Selinda Orion. Patient plans to be more active, CM informed pt to talk to his physician on the amount of exercise is best for him and how much wine to consume. Patient states that he drinks several glasses of wine while cooking ( champagne glasses). Patient could benefit from a disease management program for COPD; Patient requested [facility]. [name] with [facility] called for arrangements. He has a cane and walker at home but states that he does not use them.   On readmit -received a call from [facility] with [facility]- stating that they would not be able to take pt back under services when he discharges on this admission- pt will need to choose another agency- CM will f/u with pt regarding [place] services and offering choice for another agency- pt will need [facility] orders for RN/PT on discharge.    Expected Discharge Date:                  Expected Discharge Plan:  [place]  In-House Referral:     Discharge planning Services  CM Consult  Post Acute Care Choice:  Home Health Choice offered to:  Patient  DME Arranged:    DME Agency:     HH Arranged:  RN, Disease Management, PT [place] Agency:     Status of Service:  In process, will continue to follow  Medicare Important Message Given:    Date Medicare IM Given:    Medicare IM give by:    Date Additional Medicare IM Given:    Additional Medicare Important Message give by:     If discussed at [place] of Stay Meetings, dates discussed:    Additional Comments:  [name], RN 07/25/2015, 10:15 AM

## 2015-07-25 NOTE — Progress Notes (Signed)
  Echocardiogram 2D Echocardiogram has been performed.  Jennette Dubin 07/25/2015, 11:02 AM

## 2015-07-25 NOTE — Evaluation (Signed)
Physical Therapy Evaluation & discharge Patient Details Name: Ronald Bell MRN: [id number] DOB: 10/10/1940 Today's Date: 07/25/2015   History of Present Illness  Ronald Bell is a 75 y.o. male with medical history significant of COPD, just discharged from our service On 5/1 after an admission for acute COPD exacerbation. Patient is on 2L home O2 at night. Patient presented to the ED on CPAP with c/o sudden onset of SOB on the night of admission with associated dry cough. Per EMS O2 sats were 76% on arrival. Patient was started on Solumedrol and duoneb en route to ED.  Clinical Impression  Pt moving well with no AD.  Able to ambulate with no AD on 3 L/min with o2 sat 97-98%.  Limiting factor seems to be breathing and not mobility deficits and will therefore dc from acute PT at this time. Pt is in agreement and discussed with nursing who has been walking with patient.  Pt was to receive HHPT after last dc, but was re-admitted very quickly.  It may still be appropriate to follow up with pt at home to educate him on energy conservation techniques within his own home environment.    Follow Up Recommendations Supervision - Intermittent;Home health PT    Equipment Recommendations  None recommended by PT    Recommendations for Other Services       Precautions / Restrictions Precautions Precautions: Fall Restrictions Weight Bearing Restrictions: No      Mobility  Bed Mobility Overal bed mobility: Independent                Transfers Overall transfer level: Modified independent               General transfer comment: no c/o dizziness with standing  Ambulation/Gait Ambulation/Gait assistance: Supervision;Modified independent (Device/Increase time) Ambulation Distance (Feet): 350 Feet (x2) Assistive device: None Gait Pattern/deviations: Step-through pattern     General Gait Details: Amb on 3 L/min with head turns, head nods, start/stops, increased gait  speed with no difficulty. o2 97-98%  Stairs            Wheelchair Mobility    Modified Rankin (Stroke Patients Only)       Balance Overall balance assessment: History of Falls   Sitting balance-Leahy Scale: Normal       Standing balance-Leahy Scale: Good                               Pertinent Vitals/Pain Pain Assessment: No/denies pain    Home Living Family/patient expects to be discharged to:: Private residence Living Arrangements: Spouse/significant other Available Help at Discharge: Family;Available 24 hours/day Type of Home: House Home Access: Stairs to enter   [employer] of Steps: 1 Home Layout: One level Home Equipment: Walker - 2 wheels;Shower seat;Cane - single point      Prior Function Level of Independence: Independent         Comments: 2 falls in the past month after tripping on root of tree and other object     Hand Dominance   Dominant Hand: Left    Extremity/Trunk Assessment   Upper Extremity Assessment: Overall WFL for tasks assessed           Lower Extremity Assessment: Overall WFL for tasks assessed         Communication   Communication: No difficulties  Cognition Arousal/Alertness: Awake/alert Behavior During Therapy: WFL for tasks assessed/performed Overall Cognitive Status: Within Functional Limits  for tasks assessed                      General Comments      Exercises        Assessment/Plan    PT Assessment Patent does not need any further PT services;All further PT needs can be met in the next venue of care  PT Diagnosis Difficulty walking   PT Problem List    PT Treatment Interventions     PT Goals (Current goals can be found in the Care Plan section) Acute Rehab PT Goals Patient Stated Goal: get breathing figured out PT Goal Formulation: All assessment and education complete, DC therapy    Frequency     Barriers to discharge        Co-evaluation                End of Session Equipment Utilized During Treatment: Gait belt;Oxygen Activity Tolerance: Patient tolerated treatment well Patient left: in bed;with call bell/phone within reach Nurse Communication: Mobility status (02)         Time: 1950-9326 PT Time Calculation (min) (ACUTE ONLY): 24 min   Charges:   PT Evaluation $PT Eval Low Complexity: 1 Procedure PT Treatments $Gait Training: 8-22 mins   PT G Codes:        Ronald Bell 07/25/2015, 1:50 PM

## 2015-07-26 ENCOUNTER — Inpatient Hospital Stay: Payer: Medicare Other | Admitting: Family Medicine

## 2015-07-26 DIAGNOSIS — J441 Chronic obstructive pulmonary disease with (acute) exacerbation: Principal | ICD-10-CM

## 2015-07-26 DIAGNOSIS — I10 Essential (primary) hypertension: Secondary | ICD-10-CM

## 2015-07-26 DIAGNOSIS — J9621 Acute and chronic respiratory failure with hypoxia: Secondary | ICD-10-CM

## 2015-07-26 LAB — BASIC METABOLIC PANEL
ANION GAP: 9 (ref 5–15)
BUN: 20 mg/dL (ref 6–20)
CHLORIDE: 102 mmol/L (ref 101–111)
CO2: 31 mmol/L (ref 22–32)
Calcium: 9.5 mg/dL (ref 8.9–10.3)
Creatinine, Ser: 1.01 mg/dL (ref 0.61–1.24)
GFR calc non Af Amer: >60 mL/min (ref >60)
Glucose, Bld: 102 mg/dL — ABNORMAL HIGH (ref 65–99)
POTASSIUM: 3.8 mmol/L (ref 3.5–5.1)
Sodium: 142 mmol/L (ref 135–145)

## 2015-07-26 MED ORDER — IPRATROPIUM-ALBUTEROL 0.5-2.5 (3) MG/3ML IN SOLN: 3.0000 mL | RESPIRATORY_TRACT | Status: DC | PRN

## 2015-07-26 MED ORDER — ARFORMOTEROL TARTRATE 15 MCG/2ML IN NEBU: 15.0000 ug | INHALATION_SOLUTION | Freq: Two times a day (BID) | RESPIRATORY_TRACT | Status: DC

## 2015-07-26 MED ORDER — ALBUTEROL SULFATE (2.5 MG/3ML) 0.083% IN NEBU: 2.5000 mg | INHALATION_SOLUTION | RESPIRATORY_TRACT | Status: DC | PRN

## 2015-07-26 NOTE — Consult Note (Signed)
   [facility] Inpatient Consult   07/26/2015  Ronald Bell 1940-07-30 [id number] Patient evaluated for community based chronic disease management services with [facility] as a benefit of patient's Medicare Insurance. Spoke with patient at bedside to explain [facility] services. Patient endorses Dr. Roma Schanz as his primary care provider.  Consent form signed.  Patient is a re-admission and agrees to post hospital follow up/monitoring.  Expresses being tired of having to come back to the hospital.  COPD and HX TVAR recently.   Patient will receive post hospital discharge call and will be evaluated for monthly home visits for assessments and disease process education.  Given contact information and [facility] literature at bedside. Made Inpatient Case Manager aware that [facility] following. Of note, [facility] Care Management services does not replace or interfere with any services that are arranged by inpatient case management or social work.  For additional questions or referrals please contact:    Natividad Brood, RN BSN [facility] Liaison  [phone] business mobile phone Toll free office [phone]

## 2015-07-26 NOTE — Discharge Summary (Signed)
Physician Discharge Summary  DAVIYON WIDMAYER MRN: [id number] DOB/AGE: [birth year] 75 y.o.  PCP: Ann Held, DO   Admit date: 07/23/2015 Discharge date: 07/26/2015  Discharge Diagnoses:   Principal Problem:   Acute exacerbation of chronic obstructive pulmonary disease (COPD) ([place]) Active Problems:   Essential hypertension   COPD GOLD III with min reversibilty    COPD exacerbation (HCC)   S/P TAVR (transcatheter aortic valve replacement)   Acute on chronic respiratory failure ([place])    Follow-up recommendations Follow-up with PCP in 3-5 days , including all  additional recommended appointments as below Follow-up CBC, CMP in 3-5 days Scheduled pulmonology appointment for 5/12 at [place], With Dr. [name] (in [place]).       Current Discharge Medication List    START taking these medications   Details  albuterol (PROVENTIL) (2.5 MG/3ML) 0.083% nebulizer solution Take 3 mLs (2.5 mg total) by nebulization every 4 (four) hours as needed for wheezing or shortness of breath. Qty: 75 mL, Refills: 12    arformoterol (BROVANA) 15 MCG/2ML NEBU Take 2 mLs (15 mcg total) by nebulization 2 (two) times daily. Qty: 120 mL, Refills: 3      CONTINUE these medications which have CHANGED   Details  ipratropium-albuterol (DUONEB) 0.5-2.5 (3) MG/3ML SOLN Take 3 mLs by nebulization every 4 (four) hours as needed. Take Nebs three times a day for this week, after that continue as needed for shortness of breath or wheezing Qty: 360 mL, Refills: 3      CONTINUE these medications which have NOT CHANGED   Details  albuterol (PROVENTIL HFA;VENTOLIN HFA) 108 (90 Base) MCG/ACT inhaler Inhale 2 puffs into the lungs every 4 (four) hours as needed for wheezing or shortness of breath. Qty: 1 Inhaler, Refills: 11   Associated Diagnoses: COPD exacerbation (HCC)    Ascorbic Acid (VITAMIN C) 250 MG tablet Take 500 mg by mouth daily.     aspirin 81 MG tablet Take 81 mg by mouth daily.    atorvastatin  (LIPITOR) 40 MG tablet TAKE ONE TABLET BY MOUTH ONCE DAILY Qty: 90 tablet, Refills: 1    citalopram (CELEXA) 10 MG tablet Take 1 tablet (10 mg total) by mouth daily. Qty: 30 tablet, Refills: 3   Associated Diagnoses: Generalized anxiety disorder    diclofenac sodium (VOLTAREN) 1 % GEL Apply 2 g topically 4 (four) times daily. Qty: 100 g, Refills: 1    docusate sodium (COLACE) 100 MG capsule Take 100 mg by mouth 2 (two) times daily.     famotidine (PEPCID) 20 MG tablet Take 20 mg by mouth at bedtime.     fenofibrate 160 MG tablet Take 1 tablet (160 mg total) by mouth daily. Qty: 30 tablet, Refills: 2    ferrous sulfate 325 (65 FE) MG EC tablet Take 1 tablet (325 mg total) by mouth 2 (two) times daily. Qty: 60 tablet, Refills: 11   Associated Diagnoses: Anemia due to chronic blood loss    Glycopyrrolate-Formoterol (BEVESPI AEROSPHERE) 9-4.8 MCG/ACT AERO Inhale 2 puffs into the lungs 2 (two) times daily. Qty: 1 Inhaler, Refills: 11    metoprolol tartrate (LOPRESSOR) 25 MG tablet Take 0.5 tablets (12.5 mg total) by mouth 2 (two) times daily. Qty: 60 tablet, Refills: 6   Associated Diagnoses: Essential hypertension    Multiple Vitamins-Minerals (CENTRUM SILVER PO) Take 1 tablet by mouth daily.     OXYGEN Inhale 2 L into the lungs at bedtime.     pantoprazole (PROTONIX) 40 MG tablet Take 1  tablet (40 mg total) by mouth daily.   Associated Diagnoses: Gastroesophageal reflux disease, esophagitis presence not specified    potassium chloride SA (K-DUR,KLOR-CON) 20 MEQ tablet Take 1 tablet (20 mEq total) by mouth daily. Qty: 30 tablet, Refills: 3   Associated Diagnoses: Essential hypertension    temazepam (RESTORIL) 30 MG capsule TAKE ONE CAPSULE BY MOUTH AT BEDTIME AS NEEDED FOR SLEEP Qty: 30 capsule, Refills: 0    ALPRAZolam (XANAX) 0.5 MG tablet TAKE ONE TABLET BY MOUTH THREE TIMES DAILY AS NEEDED FOR ANXIETY Qty: 90 tablet, Refills: 0    doxycycline (VIBRA-TABS) 100 MG tablet  Take 1 tablet (100 mg total) by mouth 2 (two) times daily. X 7days Qty: 14 tablet, Refills: 0    furosemide (LASIX) 20 MG tablet Take 1 tablet (20 mg total) by mouth daily. Qty: 30 tablet, Refills: 3   Associated Diagnoses: Essential hypertension    predniSONE (DELTASONE) 10 MG tablet Prednisone dosing: Take  Prednisone 8m (4 tabs) x 2 days, then taper to 357m(3 tabs) x 3 days, then 2041m2 tabs) x 3days, then 31m54m tab) x 3days, then OFF.  Dispense:  26 tabs, refills: None Qty: 26 tablet, Refills: 0         Discharge Condition: Stable   Discharge Instructions Get Medicines reviewed and adjusted: Please take all your medications with you for your next visit with your Primary MD  Please request your Primary MD to go over all hospital tests and procedure/radiological results at the follow up, please ask your Primary MD to get all Hospital records sent to his/her office.  If you experience worsening of your admission symptoms, develop shortness of breath, life threatening emergency, suicidal or homicidal thoughts you must seek medical attention immediately by calling 911 or calling your MD immediately if symptoms less severe.  You must read complete instructions/literature along with all the possible adverse reactions/side effects for all the Medicines you take and that have been prescribed to you. Take any new Medicines after you have completely understood and accpet all the possible adverse reactions/side effects.   Do not drive when taking Pain medications.   Do not take more than prescribed Pain, Sleep and Anxiety Medications  Special Instructions: If you have smoked or chewed Tobacco in the last 2 yrs please stop smoking, stop any regular Alcohol and or any Recreational drug use.  Wear Seat belts while driving.  Please note  You were cared for by a hospitalist during your hospital stay. Once you are discharged, your primary care physician will handle any further  medical issues. Please note that NO REFILLS for any discharge medications will be authorized once you are discharged, as it is imperative that you return to your primary care physician (or establish a relationship with a primary care physician if you do not have one) for your aftercare needs so that they can reassess your need for medications and monitor your lab values.  Discharge Instructions    Diet - low sodium heart healthy    Complete by:  As directed      Increase activity slowly    Complete by:  As directed             No Known Allergies    Disposition: 06-Home-Health Care Svc   Consults:  None     Significant Diagnostic Studies:  Dg Chest 2 View  07/20/2015  CLINICAL DATA:  Fall at home. EXAM: CHEST  2 VIEW COMPARISON:  May 21, 2015. FINDINGS: Stable  cardiomediastinal silhouette. Status post aortic valve replacement. Stable bilateral calcified pleural plaques are noted. Atherosclerosis of abdominal aorta is noted. No pneumothorax or pleural effusion is noted. Stable left midlung scarring is noted. No acute pulmonary disease is noted. Atherosclerosis of thoracic aorta is noted. IMPRESSION: Stable calcified pleural plaques. Stable left midlung scarring. No acute cardiopulmonary abnormality seen. Electronically Signed   By: Marijo Conception, M.D.   On: 07/20/2015 18:27   Ct Angio Chest Pe W/cm &/or Wo Cm  07/23/2015  CLINICAL DATA:  Sudden worsening of shortness of breath. Recent hospitalization for COPD. EXAM: CT ANGIOGRAPHY CHEST WITH CONTRAST TECHNIQUE: Multidetector CT imaging of the chest was performed using the standard protocol during bolus administration of intravenous contrast. Multiplanar CT image reconstructions and MIPs were obtained to evaluate the vascular anatomy. CONTRAST:  100 mL Isovue 370 COMPARISON:  05/21/2015 FINDINGS: Technically adequate study with good opacification of the central and segmental pulmonary arteries. No focal filling defects demonstrated.  No evidence of significant pulmonary embolus. Normal heart size. Normal caliber thoracic aorta. Great vessel origins are patent. No aortic dissection. Calcification of the aorta and coronary arteries. Postoperative changes in the aortic valve. Esophagus is decompressed. No significant lymphadenopathy in the chest. Multiple bilateral calcified pleural plaques. Emphysematous changes in the lungs. Focal scarring or spiculation in the right upper lung, measuring about 5 x 9 mm. No significant changes since previous study dated 03/10/2013 suggesting benign etiology. Focal atelectasis in the lung bases. No pleural effusions. No pneumothorax. Included portions of the upper abdominal organs are grossly unremarkable. Degenerative changes in the spine. No destructive bone lesions. Review of the MIP images confirms the above findings. IMPRESSION: No evidence of significant pulmonary embolus. Stable chronic changes in the lungs with diffuse emphysematous change, fibrosis, and calcified pleural plaques. Electronically Signed   By: Lucienne Capers M.D.   On: 07/23/2015 03:38   Dg Chest Port 1 View  07/23/2015  CLINICAL DATA:  Sudden worsening of shortness of breath, cough, and CHF. EXAM: PORTABLE CHEST 1 VIEW COMPARISON:  07/20/2015 FINDINGS: Postoperative changes in the heart and in the left lower rib. Heart size and pulmonary vascularity are normal. Emphysematous changes in the lungs with scattered fibrosis. Multiple calcified pleural plaques bilaterally. No focal airspace disease or consolidation. Linear scarring in the left mid lung. No blunting of costophrenic angles. No pneumothorax. Mediastinal contours appear intact. Calcified and tortuous aorta. IMPRESSION: Stable appearance of chronic changes in the chest, including emphysematous changes, fibrosis, calcified pleural plaques, and scarring in the left mid lung. Normal heart size. Electronically Signed   By: Lucienne Capers M.D.   On: 07/23/2015 01:35      There  were no vitals filed for this visit.   Microbiology: No results found for this or any previous visit (from the past 240 hour(s)).     Blood Culture    Component Value Date/Time   SDES BLOOD LEFT ANTECUBITAL 05/18/2015 1035   SPECREQUEST BOTTLES DRAWN AEROBIC AND ANAEROBIC 10CC 05/18/2015 1035   CULT NO GROWTH 5 DAYS 05/18/2015 1035   REPTSTATUS 05/23/2015 FINAL 05/18/2015 1035      Labs: Results for orders placed or performed during the hospital encounter of 07/23/15 (from the past 48 hour(s))  Basic metabolic panel     Status: Abnormal   Collection Time: 07/25/15  4:19 AM  Result Value Ref Range   Sodium 141 135 - 145 mmol/L   Potassium 3.8 3.5 - 5.1 mmol/L   Chloride 104 101 - 111 mmol/L  CO2 30 22 - 32 mmol/L   Glucose, Bld 88 65 - 99 mg/dL   BUN 21 (H) 6 - 20 mg/dL   Creatinine, Ser 1.01 0.61 - 1.24 mg/dL   Calcium 9.4 8.9 - 10.3 mg/dL   GFR calc non Af Amer >60 >60 mL/min   GFR calc Af Amer >60 >60 mL/min    Comment: (NOTE) The eGFR has been calculated using the CKD EPI equation. This calculation has not been validated in all clinical situations. eGFR's persistently <60 mL/min signify possible Chronic Kidney Disease.    Anion gap 7 5 - 15  Basic metabolic panel     Status: Abnormal   Collection Time: 07/26/15  2:31 AM  Result Value Ref Range   Sodium 142 135 - 145 mmol/L   Potassium 3.8 3.5 - 5.1 mmol/L   Chloride 102 101 - 111 mmol/L   CO2 31 22 - 32 mmol/L   Glucose, Bld 102 (H) 65 - 99 mg/dL   BUN 20 6 - 20 mg/dL   Creatinine, Ser 1.01 0.61 - 1.24 mg/dL   Calcium 9.5 8.9 - 10.3 mg/dL   GFR calc non Af Amer >60 >60 mL/min   GFR calc Af Amer >60 >60 mL/min    Comment: (NOTE) The eGFR has been calculated using the CKD EPI equation. This calculation has not been validated in all clinical situations. eGFR's persistently <60 mL/min signify possible Chronic Kidney Disease.    Anion gap 9 5 - 15    2-D echo LV EF: 60% -  65%  ------------------------------------------------------------------- Indications: CHF - 428.0.  ------------------------------------------------------------------- History: PMH: Chronic obstructive pulmonary disease. Risk factors: Former tobacco use. Hypertension. Dyslipidemia.  ------------------------------------------------------------------- Study Conclusions  - Left ventricle: The cavity size was normal. Wall thickness was  increased in a pattern of moderate LVH. Systolic function was  normal. The estimated ejection fraction was in the range of 60%  to 65%. - Left atrium: The atrium was mildly dilated.  ------------------------------------------------------------------- Labs, prior tests, procedures, and surgery: S/p TAVR. Transthoracic echocardiography. M-mode, complete 2D, spectral Doppler, and color Doppler. Birthdate: Patient birthdate: April 20, 1940. Age: Patient is 75 yr old. Sex: Gender: male. BMI: 22.9 kg/m^2. Blood pressure: 119/63 Patient status: Inpatient. Study date: Study date: 07/25/2015. Study time: 10:23 AM. Location: Echo laboratory.  -------------------------------------------------------------------  ------------------------------------------------------------------- Left ventricle: The cavity size was normal. Wall thickness was increased in a pattern of moderate LVH. Systolic function was normal. The estimated ejection fraction was in the range of 60% to 65%.      Lab Results  Component Value Date   MICROALBUR 1.1 03/05/2014   LDLCALC 80 12/17/2014   CREATININE 1.01 07/26/2015     Deniro Laymon Bell is a 75 y.o. male with medical history significant of COPD, just discharged from our service On 5/1 after an admission for acute COPD exacerbation. Patient is on 2L home O2 at night. Patient presented to the ED on CPAP with c/o sudden onset of SOB on the night of admission with associated dry  cough. Per EMS O2 sats were 76% on arrival. Patient was started on Solumedrol and duoneb en route to ED. Patient was readmitted for COPD exacerbation, progressively improving, hoping for DC home on 5/5   Assessment & Plan    Acute on chronic hypoxic and hypercapnic respiratory failure due to COPD exacerbation - wheezing Has significantly improved  - CT angiogram negative for pulmonary embolus, chronic changes of diffuse emphysematous changes, fibrosis and calcified pleural plaques. - continue scheduled nebs, Brovana, Pulmicort,  doxycycline. Will continue Pulmicort and Brovana outpatient, as patient has significant improvement, - continue oral prednisone with taper outpatient - PTOT evaluation, home O2 evaluation - Scheduled pulmonology appointment for 5/12 at [place], With Dr. [name] (in AVS).  - Patient still feels short of breath with minimal exertion,   2-D echocardiogram for further workup as above   CAD - Cath in July 2016 revealed 95% stenosis of mid RCA and BMS placed  - Continue ASA 81, Lipitor   Paroxysmal atrial fibrillation - In sinus rhythm on admission  - CHADS-VASc is 47 (age x2, CHF, CAD, HTN), Not on AC due to recurrent GIB   Chronic diastolic CHF - shortness of breath on ambulation - Continue aspirin, Lipitor, Lasix 20 mg daily per his home dose - 2-D echo showing 60-65%,unchanged from  previous echo 10/16 had shown EF of 60-65% with grade 1 diastolic dysfunction.  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 rate 19, SpO2 96 %.  General: Alert and oriented x 3, NAD  HEENT:   Neck:  CVS: S1 S2 clear, Regular rate  and rhythm.  Respiratory: Fairly clear to auscultation bilaterally   Abdomen: Soft, nontender, nondistended, + bowel sounds  Ext: no cyanosis clubbing or edema, somewhat tremulous  Neuro: no new deficits  Skin: No rashes  Psych: Normal affect and demeanor, alert and oriented x3     Follow-up Information    Follow up with Christinia Gully, MD On 08/02/2015.   Specialty:  Pulmonary Disease   Why:  at [place], for hospital follow-up   Contact information:   [address] [address] [phone]       Signed: Reyne Dumas 07/26/2015, 1:34 PM        Time spent >45 mins

## 2015-07-26 NOTE — Progress Notes (Signed)
SATURATION QUALIFICATIONS: (This note is used to comply with regulatory documentation for home oxygen)  Patient Saturations on Room Air at Rest = 94%  Patient Saturations on Room Air while Ambulating = 89%  Patient Saturations on 2 Liters of oxygen while Ambulating = 96%  Please briefly explain why patient needs home oxygen: Ambulated immediately following nebulizer treatment.  Pt stated he felt SOB after ambulating about 26ft and sats fell to 89%.

## 2015-07-26 NOTE — Care Management Important Message (Signed)
Important Message  Patient Details  Name: Ronald Bell MRN: [id number] Date of Birth: 09/09/1940   Medicare Important Message Given:  Yes    Nathen May 07/26/2015, 3:02 PM

## 2015-07-27 ENCOUNTER — Other Ambulatory Visit: Payer: Self-pay | Admitting: Family Medicine

## 2015-07-27 MED ORDER — BUDESONIDE 0.25 MG/2ML IN SUSP: 0.2500 mg | Freq: Two times a day (BID) | RESPIRATORY_TRACT | Status: DC

## 2015-07-27 NOTE — Progress Notes (Signed)
Cm spoke to patient at the bedside about [facility] PT/OT/RN/Aide and patient agreeable to services. CM offered patient choice as [facility] no longer taking the patient for [place] and patient chose [place]. Cm called Tommi Emery with [facility] for referral and referral was accepted. CM spoke to patient about home equipment and he said that he already has nebulizer at home, oxygen with [facility] (Cm confirmed with [name] @ [place] that they will continue to provide oxygen); patient has RW, 3N1, shower chair and denies any further DME needs. Patient will have wife at home to assist with any further needs. Patient denies further needs at this time; CM remains available should additional needs arise.

## 2015-07-27 NOTE — Progress Notes (Signed)
Pt in stable condition, this RN went over dc summary and instructions wt pt and family, paper prescription given to pt, they verbalised understanding, pt belongings at bedside, iv taken out, tele dc , ccmd notified, pt taken off the floor on a wheelchair by a NT

## 2015-07-27 NOTE — Discharge Summary (Signed)
Physician Discharge Summary  Ronald Bell MRN: [id number] DOB/AGE: Dec 24, 1940 75 y.o.  PCP: Ann Held, DO   Admit date: 07/23/2015 Discharge date: 07/27/2015  Discharge Diagnoses:   Principal Problem:   Acute exacerbation of chronic obstructive pulmonary disease (COPD) ([place]) Active Problems:   Essential hypertension   COPD GOLD III with min reversibilty    COPD exacerbation (HCC)   S/P TAVR (transcatheter aortic valve replacement)   Acute on chronic respiratory failure ([place])    Follow-up recommendations Follow-up with PCP in 3-5 days , including all  additional recommended appointments as below Follow-up CBC, CMP in 3-5 days Scheduled pulmonology appointment for 5/12 at [place], With Dr. [name] (in [place]).       Current Discharge Medication List    START taking these medications   Details  albuterol (PROVENTIL) (2.5 MG/3ML) 0.083% nebulizer solution Take 3 mLs (2.5 mg total) by nebulization every 4 (four) hours as needed for wheezing or shortness of breath. Qty: 75 mL, Refills: 12    arformoterol (BROVANA) 15 MCG/2ML NEBU Take 2 mLs (15 mcg total) by nebulization 2 (two) times daily. Qty: 120 mL, Refills: 3    budesonide (PULMICORT) 0.25 MG/2ML nebulizer solution Inhale 2 mLs (0.25 mg total) into the lungs 2 (two) times daily. Qty: 60 mL, Refills: 12      CONTINUE these medications which have CHANGED   Details  ipratropium-albuterol (DUONEB) 0.5-2.5 (3) MG/3ML SOLN Take 3 mLs by nebulization every 4 (four) hours as needed. Take Nebs three times a day for this week, after that continue as needed for shortness of breath or wheezing Qty: 360 mL, Refills: 3      CONTINUE these medications which have NOT CHANGED   Details  albuterol (PROVENTIL HFA;VENTOLIN HFA) 108 (90 Base) MCG/ACT inhaler Inhale 2 puffs into the lungs every 4 (four) hours as needed for wheezing or shortness of breath. Qty: 1 Inhaler, Refills: 11   Associated Diagnoses: COPD exacerbation  (HCC)    Ascorbic Acid (VITAMIN C) 250 MG tablet Take 500 mg by mouth daily.     aspirin 81 MG tablet Take 81 mg by mouth daily.    atorvastatin (LIPITOR) 40 MG tablet TAKE ONE TABLET BY MOUTH ONCE DAILY Qty: 90 tablet, Refills: 1    citalopram (CELEXA) 10 MG tablet Take 1 tablet (10 mg total) by mouth daily. Qty: 30 tablet, Refills: 3   Associated Diagnoses: Generalized anxiety disorder    diclofenac sodium (VOLTAREN) 1 % GEL Apply 2 g topically 4 (four) times daily. Qty: 100 g, Refills: 1    docusate sodium (COLACE) 100 MG capsule Take 100 mg by mouth 2 (two) times daily.     famotidine (PEPCID) 20 MG tablet Take 20 mg by mouth at bedtime.     fenofibrate 160 MG tablet Take 1 tablet (160 mg total) by mouth daily. Qty: 30 tablet, Refills: 2    ferrous sulfate 325 (65 FE) MG EC tablet Take 1 tablet (325 mg total) by mouth 2 (two) times daily. Qty: 60 tablet, Refills: 11   Associated Diagnoses: Anemia due to chronic blood loss    metoprolol tartrate (LOPRESSOR) 25 MG tablet Take 0.5 tablets (12.5 mg total) by mouth 2 (two) times daily. Qty: 60 tablet, Refills: 6   Associated Diagnoses: Essential hypertension    Multiple Vitamins-Minerals (CENTRUM SILVER PO) Take 1 tablet by mouth daily.     OXYGEN Inhale 2 L into the lungs at bedtime.     pantoprazole (PROTONIX) 40 MG  tablet Take 1 tablet (40 mg total) by mouth daily.   Associated Diagnoses: Gastroesophageal reflux disease, esophagitis presence not specified    potassium chloride SA (K-DUR,KLOR-CON) 20 MEQ tablet Take 1 tablet (20 mEq total) by mouth daily. Qty: 30 tablet, Refills: 3   Associated Diagnoses: Essential hypertension    temazepam (RESTORIL) 30 MG capsule TAKE ONE CAPSULE BY MOUTH AT BEDTIME AS NEEDED FOR SLEEP Qty: 30 capsule, Refills: 0    ALPRAZolam (XANAX) 0.5 MG tablet TAKE ONE TABLET BY MOUTH THREE TIMES DAILY AS NEEDED FOR ANXIETY Qty: 90 tablet, Refills: 0    doxycycline (VIBRA-TABS) 100 MG tablet  Take 1 tablet (100 mg total) by mouth 2 (two) times daily. X 7days Qty: 14 tablet, Refills: 0    furosemide (LASIX) 20 MG tablet Take 1 tablet (20 mg total) by mouth daily. Qty: 30 tablet, Refills: 3   Associated Diagnoses: Essential hypertension    predniSONE (DELTASONE) 10 MG tablet Prednisone dosing: Take  Prednisone 8m (4 tabs) x 2 days, then taper to 356m(3 tabs) x 3 days, then 2069m2 tabs) x 3days, then 82m45m tab) x 3days, then OFF.  Dispense:  26 tabs, refills: None Qty: 26 tablet, Refills: 0      STOP taking these medications     Glycopyrrolate-Formoterol (BEVESPI AEROSPHERE) 9-4.8 MCG/ACT AERO          Discharge Condition: Stable   Discharge Instructions Get Medicines reviewed and adjusted: Please take all your medications with you for your next visit with your Primary MD  Please request your Primary MD to go over all hospital tests and procedure/radiological results at the follow up, please ask your Primary MD to get all Hospital records sent to his/her office.  If you experience worsening of your admission symptoms, develop shortness of breath, life threatening emergency, suicidal or homicidal thoughts you must seek medical attention immediately by calling 911 or calling your MD immediately if symptoms less severe.  You must read complete instructions/literature along with all the possible adverse reactions/side effects for all the Medicines you take and that have been prescribed to you. Take any new Medicines after you have completely understood and accpet all the possible adverse reactions/side effects.   Do not drive when taking Pain medications.   Do not take more than prescribed Pain, Sleep and Anxiety Medications  Special Instructions: If you have smoked or chewed Tobacco in the last 2 yrs please stop smoking, stop any regular Alcohol and or any Recreational drug use.  Wear Seat belts while driving.  Please note  You were cared for by a  hospitalist during your hospital stay. Once you are discharged, your primary care physician will handle any further medical issues. Please note that NO REFILLS for any discharge medications will be authorized once you are discharged, as it is imperative that you return to your primary care physician (or establish a relationship with a primary care physician if you do not have one) for your aftercare needs so that they can reassess your need for medications and monitor your lab values.  Discharge Instructions    AMB Referral to [facility]    Complete by:  As directed   Reason for consult:  High risk; re-admission  Diagnoses of:  COPD/ Pneumonia  Expected date of contact:  1-3 days (reserved for hospital discharges)  Please assign to community nurse for transition of care calls and assess for home visits. Questions please call:   VictNatividad Brood BSN CCM  [facility]  [phone] business mobile phone Toll free office [phone]     Diet - low sodium heart healthy    Complete by:  As directed      Diet - low sodium heart healthy    Complete by:  As directed      Increase activity slowly    Complete by:  As directed      Increase activity slowly    Complete by:  As directed             No Known Allergies    Disposition: 06-Home-Health Care Svc   Consults:  None     Significant Diagnostic Studies:  Dg Chest 2 View  07/20/2015  CLINICAL DATA:  Fall at home. EXAM: CHEST  2 VIEW COMPARISON:  May 21, 2015. FINDINGS: Stable cardiomediastinal silhouette. Status post aortic valve replacement. Stable bilateral calcified pleural plaques are noted. Atherosclerosis of abdominal aorta is noted. No pneumothorax or pleural effusion is noted. Stable left midlung scarring is noted. No acute pulmonary disease is noted. Atherosclerosis of thoracic aorta is noted. IMPRESSION: Stable calcified pleural plaques. Stable left midlung scarring. No acute  cardiopulmonary abnormality seen. Electronically Signed   By: Marijo Conception, M.D.   On: 07/20/2015 18:27   Ct Angio Chest Pe W/cm &/or Wo Cm  07/23/2015  CLINICAL DATA:  Sudden worsening of shortness of breath. Recent hospitalization for COPD. EXAM: CT ANGIOGRAPHY CHEST WITH CONTRAST TECHNIQUE: Multidetector CT imaging of the chest was performed using the standard protocol during bolus administration of intravenous contrast. Multiplanar CT image reconstructions and MIPs were obtained to evaluate the vascular anatomy. CONTRAST:  100 mL Isovue 370 COMPARISON:  05/21/2015 FINDINGS: Technically adequate study with good opacification of the central and segmental pulmonary arteries. No focal filling defects demonstrated. No evidence of significant pulmonary embolus. Normal heart size. Normal caliber thoracic aorta. Great vessel origins are patent. No aortic dissection. Calcification of the aorta and coronary arteries. Postoperative changes in the aortic valve. Esophagus is decompressed. No significant lymphadenopathy in the chest. Multiple bilateral calcified pleural plaques. Emphysematous changes in the lungs. Focal scarring or spiculation in the right upper lung, measuring about 5 x 9 mm. No significant changes since previous study dated 03/10/2013 suggesting benign etiology. Focal atelectasis in the lung bases. No pleural effusions. No pneumothorax. Included portions of the upper abdominal organs are grossly unremarkable. Degenerative changes in the spine. No destructive bone lesions. Review of the MIP images confirms the above findings. IMPRESSION: No evidence of significant pulmonary embolus. Stable chronic changes in the lungs with diffuse emphysematous change, fibrosis, and calcified pleural plaques. Electronically Signed   By: Lucienne Capers M.D.   On: 07/23/2015 03:38   Dg Chest Port 1 View  07/23/2015  CLINICAL DATA:  Sudden worsening of shortness of breath, cough, and CHF. EXAM: PORTABLE CHEST 1 VIEW  COMPARISON:  07/20/2015 FINDINGS: Postoperative changes in the heart and in the left lower rib. Heart size and pulmonary vascularity are normal. Emphysematous changes in the lungs with scattered fibrosis. Multiple calcified pleural plaques bilaterally. No focal airspace disease or consolidation. Linear scarring in the left mid lung. No blunting of costophrenic angles. No pneumothorax. Mediastinal contours appear intact. Calcified and tortuous aorta. IMPRESSION: Stable appearance of chronic changes in the chest, including emphysematous changes, fibrosis, calcified pleural plaques, and scarring in the left mid lung. Normal heart size. Electronically Signed   By: Lucienne Capers M.D.   On: 07/23/2015 01:35      There  were no vitals filed for this visit.   Microbiology: No results found for this or any previous visit (from the past 240 hour(s)).     Blood Culture    Component Value Date/Time   SDES BLOOD LEFT ANTECUBITAL 05/18/2015 1035   SPECREQUEST BOTTLES DRAWN AEROBIC AND ANAEROBIC 10CC 05/18/2015 1035   CULT NO GROWTH 5 DAYS 05/18/2015 1035   REPTSTATUS 05/23/2015 FINAL 05/18/2015 1035      Labs: Results for orders placed or performed during the hospital encounter of 07/23/15 (from the past 48 hour(s))  Basic metabolic panel     Status: Abnormal   Collection Time: 07/26/15  2:31 AM  Result Value Ref Range   Sodium 142 135 - 145 mmol/L   Potassium 3.8 3.5 - 5.1 mmol/L   Chloride 102 101 - 111 mmol/L   CO2 31 22 - 32 mmol/L   Glucose, Bld 102 (H) 65 - 99 mg/dL   BUN 20 6 - 20 mg/dL   Creatinine, Ser 1.01 0.61 - 1.24 mg/dL   Calcium 9.5 8.9 - 10.3 mg/dL   GFR calc non Af Amer >60 >60 mL/min   GFR calc Af Amer >60 >60 mL/min    Comment: (NOTE) The eGFR has been calculated using the CKD EPI equation. This calculation has not been validated in all clinical situations. eGFR's persistently <60 mL/min signify possible Chronic Kidney Disease.    Anion gap 9 5 - 15    2-D  echo LV EF: 60% - 65%  ------------------------------------------------------------------- Indications: CHF - 428.0.  ------------------------------------------------------------------- History: PMH: Chronic obstructive pulmonary disease. Risk factors: Former tobacco use. Hypertension. Dyslipidemia.  ------------------------------------------------------------------- Study Conclusions  - Left ventricle: The cavity size was normal. Wall thickness was  increased in a pattern of moderate LVH. Systolic function was  normal. The estimated ejection fraction was in the range of 60%  to 65%. - Left atrium: The atrium was mildly dilated.  ------------------------------------------------------------------- Labs, prior tests, procedures, and surgery: S/p TAVR. Transthoracic echocardiography. M-mode, complete 2D, spectral Doppler, and color Doppler. Birthdate: Patient birthdate: 09-12-1940. Age: Patient is 75 yr old. Sex: Gender: male. BMI: 22.9 kg/m^2. Blood pressure: 119/63 Patient status: Inpatient. Study date: Study date: 07/25/2015. Study time: 10:23 AM. Location: Echo laboratory.  -------------------------------------------------------------------  ------------------------------------------------------------------- Left ventricle: The cavity size was normal. Wall thickness was increased in a pattern of moderate LVH. Systolic function was normal. The estimated ejection fraction was in the range of 60% to 65%.      Lab Results  Component Value Date   MICROALBUR 1.1 03/05/2014   LDLCALC 80 12/17/2014   CREATININE 1.01 07/26/2015     Ronald Bell is a 75 y.o. male with medical history significant of COPD, just discharged from our service On 5/1 after an admission for acute COPD exacerbation. Patient is on 2L home O2 at night. Patient presented to the ED on CPAP with c/o sudden onset of SOB on the night of admission with  associated dry cough. Per EMS O2 sats were 76% on arrival. Patient was started on Solumedrol and duoneb en route to ED. Patient was readmitted for COPD exacerbation, progressively improving, hoping for DC home on 5/5   Assessment & Plan    Acute on chronic hypoxic and hypercapnic respiratory failure due to COPD exacerbation - wheezing Has significantly improved  - CT angiogram negative for pulmonary embolus, chronic changes of diffuse emphysematous changes, fibrosis and calcified pleural plaques. - continue scheduled /When necessary nebs,  doxycycline.  - continue oral prednisone with taper  outpatient - PTOT evaluation, home O2 evaluation - Scheduled pulmonology appointment for 5/12 at [place], With Dr. [name] (in [place]).  - Patient still feels short of breath with minimal exertion,   2-D echocardiogram for further workup showed LVH but normal EF   CAD - Cath in July 2016 revealed 95% stenosis of mid RCA and BMS placed  - Continue ASA 81, Lipitor   Paroxysmal atrial fibrillation - In sinus rhythm on admission  - CHADS-VASc is 58 (age x2, CHF, CAD, HTN), Not on AC due to recurrent GIB   Chronic diastolic CHF - no exacerbation - Continue aspirin, Lipitor, Lasix 20 mg daily per his home dose - 2-D echo showing 60-65%,unchanged from  previous echo 10/16 had shown EF of 60-65% with grade 1 diastolic dysfunction.  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 rate 18, SpO2 96 %.  General: Alert and oriented x 3, NAD  HEENT:   Neck:  CVS: S1 S2 clear, Regular rate and rhythm.  Respiratory: Fairly clear to auscultation bilaterally    Abdomen: Soft, nontender, nondistended, + bowel sounds  Ext: no cyanosis clubbing or edema, somewhat tremulous  Neuro: no new deficits  Skin: No rashes  Psych: Normal affect and demeanor, alert and oriented x3     Follow-up Information    Follow up with Christinia Gully, MD On 08/02/2015.   Specialty:  Pulmonary Disease   Why:  at [facility], for hospital follow-up   Contact information:   [address] [place] [address] [phone]       Follow up with Ann Held, DO. Schedule an appointment as soon as possible for a visit in 3 days.   Specialty:  Family Medicine   Why:  Hospital follow-up   Contact information:   [address] [place] [address] [phone]       Signed: Reyne Dumas 07/27/2015, 9:08 AM        Time spent >45 mins

## 2015-07-29 ENCOUNTER — Other Ambulatory Visit: Payer: Self-pay | Admitting: *Deleted

## 2015-07-29 ENCOUNTER — Telehealth: Payer: Self-pay | Admitting: Behavioral Health

## 2015-07-29 ENCOUNTER — Other Ambulatory Visit: Payer: Self-pay

## 2015-07-29 NOTE — Patient Outreach (Signed)
[facility]) Care Management  07/29/2015  Ronald Bell 27-Oct-1940 [id number]  Transition of care (Week 1)  RN spoke with pt's spouse and introduced the [facility] program and requested to speak with the pt however pt not available. RN offered to follow up at a later day (agreed). RN left contact information and will follow up later this week for transition of care.  Raina Mina, RN Care Management Coordinator [place] Office [phone]

## 2015-07-29 NOTE — Telephone Encounter (Signed)
Transition Care Management Follow-up Telephone Call  PCP: Ann Held, DO  Admit date: 07/23/2015 Discharge date: 07/27/2015  Discharge Diagnoses:   Principal Problem:  Acute exacerbation of chronic obstructive pulmonary disease (COPD) ([place]) Active Problems:  Essential hypertension  COPD GOLD III with min reversibilty   COPD exacerbation ([place])  S/P TAVR (transcatheter aortic valve replacement)  Acute on chronic respiratory failure ([place])  Follow-up recommendations Follow-up with PCP in 3-5 days , including all additional recommended appointments as below Follow-up CBC, CMP in 3-5 days Scheduled pulmonology appointment for 5/12 at [place], With Dr. [name] (in [place]).   Discharge Condition: Stable   How have you been since you were released from the hospital? Patient stated, "I'm doing all right".   Do you understand why you were in the hospital? yes   Do you understand the discharge instructions? yes   Where were you discharged to? Home with wife.   Items Reviewed:  Medications reviewed: no, patient voiced that he would like to go over his medications at the appointment due to several changes in the hospital.  Allergies reviewed: yes, NKA  Dietary changes reviewed: no dietary changes were made.  Referrals reviewed: yes, follow-up with PCP in 3-5 days; Pulmonology Appointment w/ Dr. [name] on 5/12   Functional Questionnaire:   Activities of Daily Living (ADLs):   He states they are independent in the following: ambulation, bathing and hygiene, feeding, continence, grooming, toileting and dressing States they require assistance with the following: None   Any transportation issues/concerns?: no   Any patient concerns? no   Confirmed importance and date/time of follow-up visits scheduled yes, 08/02/15 at 11:30 AM.  Provider Appointment booked with Brunetta Jeans, PA-C.  Confirmed with patient if condition begins to worsen call PCP or go to the ER.   Patient was given the office number and encouraged to call back with question or concerns.  : yes

## 2015-07-30 ENCOUNTER — Other Ambulatory Visit: Payer: Self-pay | Admitting: *Deleted

## 2015-07-30 ENCOUNTER — Encounter (HOSPITAL_COMMUNITY): Payer: Self-pay | Admitting: *Deleted

## 2015-07-30 ENCOUNTER — Inpatient Hospital Stay (HOSPITAL_COMMUNITY)
Admission: EM | Admit: 2015-07-30 | Discharge: 2015-08-02 | DRG: 190 | Disposition: A | Discharge status: Home Health | Payer: Medicare Other | Attending: Internal Medicine | Admitting: Internal Medicine

## 2015-07-30 ENCOUNTER — Emergency Department (HOSPITAL_COMMUNITY): Payer: Medicare Other

## 2015-07-30 DIAGNOSIS — D509 Iron deficiency anemia, unspecified: Secondary | ICD-10-CM | POA: Diagnosis present

## 2015-07-30 DIAGNOSIS — Z87891 Personal history of nicotine dependence: Secondary | ICD-10-CM

## 2015-07-30 DIAGNOSIS — I480 Paroxysmal atrial fibrillation: Secondary | ICD-10-CM | POA: Diagnosis present

## 2015-07-30 DIAGNOSIS — Z955 Presence of coronary angioplasty implant and graft: Secondary | ICD-10-CM

## 2015-07-30 DIAGNOSIS — Z9981 Dependence on supplemental oxygen: Secondary | ICD-10-CM

## 2015-07-30 DIAGNOSIS — J441 Chronic obstructive pulmonary disease with (acute) exacerbation: Principal | ICD-10-CM | POA: Diagnosis present

## 2015-07-30 DIAGNOSIS — J9620 Acute and chronic respiratory failure, unspecified whether with hypoxia or hypercapnia: Secondary | ICD-10-CM | POA: Diagnosis present

## 2015-07-30 DIAGNOSIS — I110 Hypertensive heart disease with heart failure: Secondary | ICD-10-CM | POA: Diagnosis present

## 2015-07-30 DIAGNOSIS — I739 Peripheral vascular disease, unspecified: Secondary | ICD-10-CM | POA: Diagnosis present

## 2015-07-30 DIAGNOSIS — I5032 Chronic diastolic (congestive) heart failure: Secondary | ICD-10-CM | POA: Diagnosis not present

## 2015-07-30 DIAGNOSIS — I350 Nonrheumatic aortic (valve) stenosis: Secondary | ICD-10-CM

## 2015-07-30 DIAGNOSIS — I10 Essential (primary) hypertension: Secondary | ICD-10-CM | POA: Diagnosis not present

## 2015-07-30 DIAGNOSIS — J449 Chronic obstructive pulmonary disease, unspecified: Secondary | ICD-10-CM | POA: Diagnosis present

## 2015-07-30 DIAGNOSIS — I2510 Atherosclerotic heart disease of native coronary artery without angina pectoris: Secondary | ICD-10-CM | POA: Diagnosis present

## 2015-07-30 DIAGNOSIS — Z952 Presence of prosthetic heart valve: Secondary | ICD-10-CM

## 2015-07-30 DIAGNOSIS — E785 Hyperlipidemia, unspecified: Secondary | ICD-10-CM | POA: Diagnosis not present

## 2015-07-30 DIAGNOSIS — R0602 Shortness of breath: Secondary | ICD-10-CM | POA: Diagnosis not present

## 2015-07-30 DIAGNOSIS — J8410 Pulmonary fibrosis, unspecified: Secondary | ICD-10-CM | POA: Diagnosis not present

## 2015-07-30 DIAGNOSIS — R069 Unspecified abnormalities of breathing: Secondary | ICD-10-CM | POA: Diagnosis not present

## 2015-07-30 DIAGNOSIS — Z7982 Long term (current) use of aspirin: Secondary | ICD-10-CM

## 2015-07-30 DIAGNOSIS — Z77090 Contact with and (suspected) exposure to asbestos: Secondary | ICD-10-CM | POA: Diagnosis present

## 2015-07-30 DIAGNOSIS — K219 Gastro-esophageal reflux disease without esophagitis: Secondary | ICD-10-CM | POA: Diagnosis present

## 2015-07-30 DIAGNOSIS — Z79899 Other long term (current) drug therapy: Secondary | ICD-10-CM

## 2015-07-30 DIAGNOSIS — J9621 Acute and chronic respiratory failure with hypoxia: Secondary | ICD-10-CM | POA: Diagnosis not present

## 2015-07-30 LAB — CBC
HCT: 36.8 % — ABNORMAL LOW (ref 39.0–52.0)
HEMOGLOBIN: 12 g/dL — AB (ref 13.0–17.0)
MCH: 33.1 pg (ref 26.0–34.0)
MCHC: 32.6 g/dL (ref 30.0–36.0)
MCV: 101.7 fL — AB (ref 78.0–100.0)
Platelets: 279 10*3/uL (ref 150–400)
RBC: 3.62 MIL/uL — AB (ref 4.22–5.81)
RDW: 13.2 % (ref 11.5–15.5)
WBC: 11.9 10*3/uL — ABNORMAL HIGH (ref 4.0–10.5)

## 2015-07-30 LAB — I-STAT TROPONIN, ED: TROPONIN I, POC: 0.02 ng/mL (ref 0.00–0.08)

## 2015-07-30 LAB — BASIC METABOLIC PANEL
Anion gap: 11 (ref 5–15)
BUN: 28 mg/dL — ABNORMAL HIGH (ref 6–20)
CHLORIDE: 105 mmol/L (ref 101–111)
CO2: 25 mmol/L (ref 22–32)
CREATININE: 0.95 mg/dL (ref 0.61–1.24)
Calcium: 9.4 mg/dL (ref 8.9–10.3)
Glucose, Bld: 64 mg/dL — ABNORMAL LOW (ref 65–99)
Potassium: 4.9 mmol/L (ref 3.5–5.1)
Sodium: 141 mmol/L (ref 135–145)

## 2015-07-30 LAB — BRAIN NATRIURETIC PEPTIDE: B Natriuretic Peptide: 68 pg/mL (ref 0.0–100.0)

## 2015-07-30 MED ORDER — IPRATROPIUM-ALBUTEROL 0.5-2.5 (3) MG/3ML IN SOLN
3.0000 mL | Freq: Four times a day (QID) | RESPIRATORY_TRACT | Status: DC
  Administered 2015-07-30 – 2015-07-31 (×3): 3 mL via RESPIRATORY_TRACT
  Filled 2015-07-30 (×3): qty 3

## 2015-07-30 MED ORDER — ALBUTEROL SULFATE (2.5 MG/3ML) 0.083% IN NEBU: 2.5000 mg | INHALATION_SOLUTION | RESPIRATORY_TRACT | Status: DC | PRN

## 2015-07-30 MED ORDER — DOCUSATE SODIUM 100 MG PO CAPS
100.0000 mg | ORAL_CAPSULE | Freq: Two times a day (BID) | ORAL | Status: DC
  Administered 2015-07-30 – 2015-08-02 (×7): 100 mg via ORAL
  Filled 2015-07-30 (×7): qty 1

## 2015-07-30 MED ORDER — SODIUM CHLORIDE 0.9 % IV SOLN: 250.0000 mL | INTRAVENOUS | Status: DC | PRN

## 2015-07-30 MED ORDER — IPRATROPIUM BROMIDE 0.02 % IN SOLN
0.5000 mg | Freq: Once | RESPIRATORY_TRACT | Status: AC
  Administered 2015-07-30: 0.5 mg via RESPIRATORY_TRACT
  Filled 2015-07-30: qty 2.5

## 2015-07-30 MED ORDER — ENOXAPARIN SODIUM 40 MG/0.4ML ~~LOC~~ SOLN
40.0000 mg | SUBCUTANEOUS | Status: DC
  Administered 2015-07-30 – 2015-08-02 (×4): 40 mg via SUBCUTANEOUS
  Filled 2015-07-30 (×4): qty 0.4

## 2015-07-30 MED ORDER — METOPROLOL TARTRATE 12.5 MG HALF TABLET
12.5000 mg | ORAL_TABLET | Freq: Two times a day (BID) | ORAL | Status: DC
  Administered 2015-07-30 – 2015-08-02 (×7): 12.5 mg via ORAL
  Filled 2015-07-30 (×7): qty 1

## 2015-07-30 MED ORDER — TEMAZEPAM 15 MG PO CAPS
30.0000 mg | ORAL_CAPSULE | Freq: Every evening | ORAL | Status: DC | PRN
  Administered 2015-07-30 – 2015-08-01 (×3): 30 mg via ORAL
  Filled 2015-07-30 (×3): qty 2

## 2015-07-30 MED ORDER — IPRATROPIUM-ALBUTEROL 0.5-2.5 (3) MG/3ML IN SOLN: 3.0000 mL | RESPIRATORY_TRACT | Status: DC

## 2015-07-30 MED ORDER — VITAMIN C 500 MG PO TABS
500.0000 mg | ORAL_TABLET | Freq: Every day | ORAL | Status: DC
  Administered 2015-07-30 – 2015-08-02 (×4): 500 mg via ORAL
  Filled 2015-07-30 (×4): qty 1

## 2015-07-30 MED ORDER — SENNOSIDES-DOCUSATE SODIUM 8.6-50 MG PO TABS: 1.0000 | ORAL_TABLET | Freq: Every evening | ORAL | Status: DC | PRN

## 2015-07-30 MED ORDER — CITALOPRAM HYDROBROMIDE 10 MG PO TABS
10.0000 mg | ORAL_TABLET | Freq: Every day | ORAL | Status: DC
  Administered 2015-07-30 – 2015-08-02 (×4): 10 mg via ORAL
  Filled 2015-07-30 (×4): qty 1

## 2015-07-30 MED ORDER — ALPRAZOLAM 0.5 MG PO TABS: 0.5000 mg | ORAL_TABLET | Freq: Three times a day (TID) | ORAL | Status: DC | PRN

## 2015-07-30 MED ORDER — ONDANSETRON HCL 4 MG PO TABS: 4.0000 mg | ORAL_TABLET | Freq: Four times a day (QID) | ORAL | Status: DC | PRN

## 2015-07-30 MED ORDER — PANTOPRAZOLE SODIUM 40 MG PO TBEC
40.0000 mg | DELAYED_RELEASE_TABLET | Freq: Every day | ORAL | Status: DC
  Administered 2015-07-30 – 2015-08-02 (×4): 40 mg via ORAL
  Filled 2015-07-30 (×4): qty 1

## 2015-07-30 MED ORDER — FENOFIBRATE 160 MG PO TABS
160.0000 mg | ORAL_TABLET | Freq: Every day | ORAL | Status: DC
  Administered 2015-07-30 – 2015-08-02 (×4): 160 mg via ORAL
  Filled 2015-07-30 (×4): qty 1

## 2015-07-30 MED ORDER — SODIUM CHLORIDE 0.9% FLUSH: 3.0000 mL | INTRAVENOUS | Status: DC | PRN

## 2015-07-30 MED ORDER — METHYLPREDNISOLONE SODIUM SUCC 125 MG IJ SOLR
60.0000 mg | Freq: Four times a day (QID) | INTRAMUSCULAR | Status: DC
  Administered 2015-07-30 – 2015-08-01 (×10): 60 mg via INTRAVENOUS
  Filled 2015-07-30 (×10): qty 2

## 2015-07-30 MED ORDER — ACETAMINOPHEN 325 MG PO TABS: 650.0000 mg | ORAL_TABLET | Freq: Four times a day (QID) | ORAL | Status: DC | PRN

## 2015-07-30 MED ORDER — SODIUM CHLORIDE 0.9% FLUSH
3.0000 mL | Freq: Two times a day (BID) | INTRAVENOUS | Status: DC
  Administered 2015-07-30 – 2015-08-02 (×7): 3 mL via INTRAVENOUS

## 2015-07-30 MED ORDER — ASPIRIN EC 81 MG PO TBEC
81.0000 mg | DELAYED_RELEASE_TABLET | Freq: Every day | ORAL | Status: DC
  Administered 2015-07-30 – 2015-08-02 (×4): 81 mg via ORAL
  Filled 2015-07-30 (×4): qty 1

## 2015-07-30 MED ORDER — FAMOTIDINE 20 MG PO TABS
20.0000 mg | ORAL_TABLET | Freq: Every day | ORAL | Status: DC
  Administered 2015-07-30 – 2015-08-01 (×3): 20 mg via ORAL
  Filled 2015-07-30 (×3): qty 1

## 2015-07-30 MED ORDER — ATORVASTATIN CALCIUM 40 MG PO TABS
40.0000 mg | ORAL_TABLET | Freq: Every day | ORAL | Status: DC
  Administered 2015-07-30 – 2015-08-02 (×4): 40 mg via ORAL
  Filled 2015-07-30 (×4): qty 1

## 2015-07-30 MED ORDER — ACETAMINOPHEN 650 MG RE SUPP: 650.0000 mg | Freq: Four times a day (QID) | RECTAL | Status: DC | PRN

## 2015-07-30 MED ORDER — ALBUTEROL (5 MG/ML) CONTINUOUS INHALATION SOLN
10.0000 mg/h | INHALATION_SOLUTION | Freq: Once | RESPIRATORY_TRACT | Status: AC
  Administered 2015-07-30: 10 mg/h via RESPIRATORY_TRACT
  Filled 2015-07-30: qty 20

## 2015-07-30 MED ORDER — ONDANSETRON HCL 4 MG/2ML IJ SOLN: 4.0000 mg | Freq: Four times a day (QID) | INTRAMUSCULAR | Status: DC | PRN

## 2015-07-30 MED ORDER — FERROUS SULFATE 325 (65 FE) MG PO TABS
325.0000 mg | ORAL_TABLET | Freq: Two times a day (BID) | ORAL | Status: DC
  Administered 2015-07-30 – 2015-08-02 (×7): 325 mg via ORAL
  Filled 2015-07-30 (×9): qty 1

## 2015-07-30 MED ORDER — PREDNISONE 20 MG PO TABS
60.0000 mg | ORAL_TABLET | Freq: Once | ORAL | Status: AC
  Administered 2015-07-30: 60 mg via ORAL
  Filled 2015-07-30: qty 3

## 2015-07-30 NOTE — ED Notes (Signed)
MD at bedside., decreased O2 to 1L per MD, O2-99% at this time.

## 2015-07-30 NOTE — ED Notes (Signed)
Admitting at bedside 

## 2015-07-30 NOTE — ED Notes (Signed)
MD at bedside. 

## 2015-07-30 NOTE — ED Notes (Signed)
Pt presents via [facility] for SOB.  Pt woke up feeling SOB, wife called EMS.  EMS arrived and pt was taking home neb tx and also took Pulmicort.   Hx: COPD, CHF, recently d/c after COPD exacerbation.  125 solumedrol administered by EMS.  Lungs initially tight and wheezing noted by EMS, another duoneb administered.  EMS reports 92% RA, 98% on neb.  BP-104/68, R-initially 32, decreased to 20.  EKG unremarkable.  HR 95.  A x 4, able to speak in complete sentences, airway intact.

## 2015-07-30 NOTE — ED Provider Notes (Signed)
CSN: [id number]     Arrival date & time 07/30/15  0755 History   First MD Initiated Contact with Patient 07/30/15 0800     Chief Complaint  Patient presents with  . Shortness of Breath     (Consider location/radiation/quality/duration/timing/severity/associated sxs/prior Treatment) HPI  GARLEN NAFFZIGER is a 75 y.o. male brought in by ambulance with h/o HTN, HLD, CAD, asbestosis, COPD on 2L home O2 at night, CHF, and Afib, presenting with recurrent SOB.  Patient was recently admitted (4/29-5/1) and readmitted (5/2-5/7) to [facility] for SOB 2/2 COPD exacerbation. Patient was sent home with Doxycycline and Prednisone taper.  Patient reports he has been taking all medications and nebulizers at home.  He was wearing his oxygen last night when he became increasingly SOB, took a nebulizer, felt slightly better, and then became SOB with exertion again when walking back to his bedroom.  He endorses SOB at rest that is manageable, but states he is acutely SOB with exertion.    He endorses stable 2 pillow orthopnea.  He denies leg swelling, weight gain, or chest pain.  He denies calf swelling or calf pain.  He was on DVT prophylaxis while admitted.  Upon representation to ED 5/2, patient underwent CTA chest which was negative for PE.  CXRs and CT chest show chronic bilateral calcified pleural plaques, which patient reports is 2/2 asbestosis exposure as a teenager.  He is a retired Chief Financial Officer.  He has quit smoking, drinks 1-2 glasses of wine nightly, and denies drugs.   Past Medical History  Diagnosis Date  . Polyp of nasal cavity   . Rosacea   . Hx of adenomatous colonic polyps 2012, 2013.   . Tobacco abuse   . Hypertension   . Hyperlipidemia   . GERD (gastroesophageal reflux disease)   . GI bleed 2010    4 units PRBCs  . Peripheral vascular disease ([place])   . Irregular heartbeat   . Shingles   . Carotid artery occlusion   . Angiodysplasia of intestine with hemorrhage     large and SB, gastric AVMs.    . Pleural plaque with presence of asbestos 03/27/2013    Followed in Pulmonary clinic/ [facility]/ [place] - F/u CT 09/08/2013 1. Stable extensive calcified pleural plaque formation consistent with asbestos exposure. 2. Multiple pulmonary nodules are unchanged from the CT of 6 months ago. Given risk factors for lung cancer, continued follow up is recommended with chest CT in 6 months> done 04/20/14 no change >repeat in 12 m in tickle file     . GERD 11/30/2008    Qualifier: Diagnosis of  By: Marijean Niemann CMA, Danielle    . PVD (peripheral vascular disease) ([place]) 10/18/2012  . Carotid artery stenosis 04/22/2012  . Coronary artery disease   . COPD GOLD III with min reversibilty  08/11/2006    Followed in Pulmonary clinic/ [facility]/ [place] - PFT's 04/28/2013  FEV1 0.88 (40%) with ratio 44 and 14% better p B2 dlco 45 corrects to 83 - Trial of breo 04/28/2013 > improved symptoms  06/09/2013  - spirometry 06/04/2014 FEV1  0.76 (29%) ratio 45      . Iron deficiency anemia 01/25/2009    Qualifier: Diagnosis of  By: Henrene Pastor MD, Docia Chuck   . History of blood transfusion "couple times"    "related to bleeding in colon and esophagus"  . Arthritis     "left shoulder" (10/19/2014)  . Anxiety   . 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 On home oxygen therapy     "2L at night" (07/23/2015)   Past Surgical History  Procedure Laterality Date  . Iliac artery stent Left 2005    CIA  . Esophagogastroduodenoscopy  2012    normal  . Colonoscopy  July 2015    Dr. Henrene Pastor  . Tonsillectomy    . Knee arthroscopy with medial menisectomy Left 03/08/2014    Procedure: LEFT KNEE SCOPE WITH MEDIAL MENISECTOMY AND CHONDROPLASTY;  Surgeon: Ninetta Lights, MD;  Location: [place];  Service: Orthopedics;  Laterality: Left;  . Left and right heart catheterization with coronary angiogram N/A 06/28/2014    Procedure: LEFT AND RIGHT  HEART CATHETERIZATION WITH CORONARY ANGIOGRAM;  Surgeon: Jerline Pain, MD;  Location: [facility] CATH LAB;  Service: Cardiovascular;  Laterality: N/A;  . Esophagogastroduodenoscopy N/A 08/17/2014    Procedure: ESOPHAGOGASTRODUODENOSCOPY (EGD);  Surgeon: Jerene Bears, MD;  Location: [facility] ENDOSCOPY;  Service: Endoscopy;  Laterality: N/A;  . Colonoscopy N/A 08/17/2014    Procedure: COLONOSCOPY;  Surgeon: Jerene Bears, MD;  Location: [facility] ENDOSCOPY;  Service: Endoscopy;  Laterality: N/A;  . Cardiac catheterization  2001; 06/28/2014  . Cardiac catheterization N/A 10/19/2014    Procedure: Coronary Stent Intervention;  Surgeon: Burnell Blanks, MD;  Location: [place] CV LAB;  Service: Cardiovascular;  Laterality: N/A;  BMS Mid RCA  . Transcatheter aortic valve replacement, transapical N/A 11/20/2014    Procedure: TRANSCATHETER AORTIC VALVE REPLACEMENT, TRANSAPICAL;  Surgeon: [name], MD;  Location: [employer];  Service: Open Heart Surgery;  Laterality: N/A;  . Tee without cardioversion N/A 11/20/2014    Procedure: TRANSESOPHAGEAL ECHOCARDIOGRAM (TEE);  Surgeon: [name], MD;  Location: [place];  Service: Open Heart Surgery;  Laterality: N/A;  . Rib plating Left 11/20/2014    Procedure: RIB PLATING OF LEFT 8TH RIB;  Surgeon: [name], MD;  Location: [place];  Service: Open Heart Surgery;  Laterality: Left;  . Colonoscopy N/A 12/05/2014    Procedure: COLONOSCOPY;  Surgeon: Manus Gunning, MD;  Location: [place];  Service: Gastroenterology;  Laterality: N/A;  . Enteroscopy N/A 12/05/2014    Procedure: ENTEROSCOPY;  Surgeon: Manus Gunning, MD;  Location: [place];  Service: Gastroenterology;  Laterality: N/A;   Family History  Problem Relation Age of Onset  . Lung disease Mother     pulm fibrosis  . Colitis Father   . Colon cancer Neg Hx   . Heart disease Brother   . Hypertension Brother   . Hyperlipidemia Brother   . CAD Daughter     cad  . Hypertension Son     Social History  Substance Use Topics  . Smoking status: Former Smoker -- 1.00 packs/day for 60 years    Types: Cigarettes, E-cigarettes    Quit date: 04/18/2015  . Smokeless tobacco: Never Used  . Alcohol Use: 8.4 oz/week    14 Glasses of wine per week    Review of Systems  Constitutional: Negative for fever and chills.  HENT: Negative for congestion and trouble swallowing.   Respiratory: Positive for shortness of breath and wheezing. Negative for cough.   Cardiovascular: Negative for chest pain, palpitations and leg swelling.  Gastrointestinal: Positive for nausea. Negative for vomiting and abdominal pain.  Genitourinary: Negative for dysuria and hematuria.  Neurological: Negative for dizziness and syncope.  Psychiatric/Behavioral: Negative for confusion.      Allergies  Review of  patient's allergies indicates no known allergies.  Home Medications   Prior to Admission medications   Medication Sig Start Date End Date Taking? Authorizing Provider  albuterol (PROVENTIL HFA;VENTOLIN HFA) 108 (90 Base) MCG/ACT inhaler Inhale 2 puffs into the lungs every 4 (four) hours as needed for wheezing or shortness of breath. 05/23/15   Rosalita Chessman Chase, DO  albuterol (PROVENTIL) (2.5 MG/3ML) 0.083% nebulizer solution Take 3 mLs (2.5 mg total) by nebulization every 4 (four) hours as needed for wheezing or shortness of breath. 07/26/15   Reyne Dumas, MD  ALPRAZolam Duanne Moron) 0.5 MG tablet TAKE ONE TABLET BY MOUTH THREE TIMES DAILY AS NEEDED FOR ANXIETY Patient not taking: Reported on 07/23/2015 07/22/15   Alferd Apa Lowne Chase, DO  arformoterol (BROVANA) 15 MCG/2ML NEBU Take 2 mLs (15 mcg total) by nebulization 2 (two) times daily. 07/26/15   Reyne Dumas, MD  Ascorbic Acid (VITAMIN C) 250 MG tablet Take 500 mg by mouth daily.     Historical Provider, MD  aspirin 81 MG tablet Take 81 mg by mouth daily.    Historical Provider, MD  atorvastatin (LIPITOR) 40 MG tablet TAKE ONE TABLET BY MOUTH ONCE DAILY  02/12/15   Alferd Apa Lowne Chase, DO  budesonide (PULMICORT) 0.25 MG/2ML nebulizer solution Inhale 2 mLs (0.25 mg total) into the lungs 2 (two) times daily. 07/27/15   Reyne Dumas, MD  citalopram (CELEXA) 10 MG tablet Take 1 tablet (10 mg total) by mouth daily. 05/09/15   Rosalita Chessman Chase, DO  diclofenac sodium (VOLTAREN) 1 % GEL Apply 2 g topically 4 (four) times daily. Patient taking differently: Apply 2 g topically 4 (four) times daily as needed (for pain).  04/25/15   Rosalita Chessman Chase, DO  docusate sodium (COLACE) 100 MG capsule Take 100 mg by mouth 2 (two) times daily.     Historical Provider, MD  doxycycline (VIBRA-TABS) 100 MG tablet Take 1 tablet (100 mg total) by mouth 2 (two) times daily. X 7days Patient not taking: Reported on 07/23/2015 07/22/15   Ripudeep Krystal Eaton, MD  famotidine (PEPCID) 20 MG tablet Take 20 mg by mouth at bedtime.     Historical Provider, MD  fenofibrate 160 MG tablet Take 1 tablet (160 mg total) by mouth daily. 05/15/15   Rosalita Chessman Chase, DO  ferrous sulfate 325 (65 FE) MG EC tablet Take 1 tablet (325 mg total) by mouth 2 (two) times daily. 05/09/15   Rosalita Chessman Chase, DO  furosemide (LASIX) 20 MG tablet Take 1 tablet (20 mg total) by mouth daily. Patient not taking: Reported on 07/23/2015 07/22/15   Ripudeep K Rai, MD  ipratropium-albuterol (DUONEB) 0.5-2.5 (3) MG/3ML SOLN Take 3 mLs by nebulization every 4 (four) hours as needed. Take Nebs three times a day for this week, after that continue as needed for shortness of breath or wheezing 07/26/15   Reyne Dumas, MD  metoprolol tartrate (LOPRESSOR) 25 MG tablet Take 0.5 tablets (12.5 mg total) by mouth 2 (two) times daily. 05/09/15   Rosalita Chessman Chase, DO  Multiple Vitamins-Minerals (CENTRUM SILVER PO) Take 1 tablet by mouth daily.     Historical Provider, MD  OXYGEN Inhale 2 L into the lungs at bedtime.     Historical Provider, MD  pantoprazole (PROTONIX) 40 MG tablet Take 1 tablet (40 mg total) by mouth daily.  05/09/15   Rosalita Chessman Chase, DO  potassium chloride SA (K-DUR,KLOR-CON) 20 MEQ tablet Take 1 tablet (20 mEq total)  by mouth daily. 05/09/15   Rosalita Chessman Chase, DO  predniSONE (DELTASONE) 10 MG tablet Prednisone dosing: Take  Prednisone 40mg  (4 tabs) x 2 days, then taper to 30mg  (3 tabs) x 3 days, then 20mg  (2 tabs) x 3days, then 10mg  (1 tab) x 3days, then OFF.  Dispense:  26 tabs, refills: None Patient not taking: Reported on 07/23/2015 07/23/15   Ripudeep K Rai, MD  temazepam (RESTORIL) 30 MG capsule TAKE ONE CAPSULE BY MOUTH AT BEDTIME AS NEEDED FOR SLEEP Patient taking differently: TAKE ONE CAPSULE BY MOUTH AT BEDTIME FOR SLEEP 07/16/15   Yvonne R Lowne Chase, DO   BP 136/62 mmHg  Pulse 94  Temp(Src) 97.8 F (36.6 C) (Axillary)  Resp 22  SpO2 98% Physical Exam  Constitutional: He is oriented to person, place, and time. He appears well-developed and well-nourished.  Mild respiratory distress.  Unable to speak in full sentences.  HENT:  Head: Normocephalic and atraumatic.  Eyes: EOM are normal. No scleral icterus.  Neck: No JVD present. No tracheal deviation present.  Cardiovascular: Normal rate, regular rhythm, normal heart sounds and intact distal pulses.   Borderline tachycardic.  Regular rhythm.  Pulmonary/Chest: Effort normal. No stridor.  Mild respiratory distress.  Unable to speak in full sentences.  Diffuse wheezing in all zones. No crackles.  Abdominal: Soft. He exhibits no distension. There is no tenderness. There is no rebound and no guarding.  Musculoskeletal: He exhibits no edema.  Neurological: He is alert and oriented to person, place, and time.  Skin: Skin is warm and dry. He is not diaphoretic.    ED Course  Procedures (including critical care time) Labs Review Labs Reviewed  BASIC METABOLIC PANEL  CBC  BRAIN NATRIURETIC PEPTIDE  I-STAT [place], ED    Imaging Review Dg Chest Port 1 View  07/30/2015  CLINICAL DATA:  Shortness of Breath, transcatheter  aortic valve replacement last year EXAM: PORTABLE CHEST 1 VIEW COMPARISON:  07/23/2015 FINDINGS: Cardiomediastinal silhouette is stable. Hyperinflation and fibrotic changes again noted bilaterally. Stable bilateral calcified pleural plaques. Again noted streaky atelectasis and fibrotic changes in lingula. No definite superimposed infiltrate. No pulmonary edema. Atherosclerotic calcifications of thoracic aorta. IMPRESSION: Hyperinflation and fibrotic changes again noted bilaterally. Stable bilateral calcified pleural plaques. Again noted streaky atelectasis and fibrotic changes in lingula. No definite superimposed infiltrate. No pulmonary edema. Electronically Signed   By: Lahoma Crocker M.D.   On: 07/30/2015 08:24   I have personally reviewed and evaluated these images and lab results as part of my medical decision-making.   EKG Interpretation None      MDM   Final diagnoses:  SOB (shortness of breath)   75 yo male with HTN, HLD, CAD, asbestosis, COPD on 2L home O2 at night, CHF, and Afib, presenting with recurrent SOB after discharge for COPD exacerbation.  Symptoms remain consistent from previous admissions with recent CTA chest negative for PE.  Given his known asbestosis with bilateral calcified plaques, COPD, and fibrotic changes on CT, this is likely continued COPD exacerbation vs worsening underlying pulmonary fibrosis.   Patient normally only on 2L at night, but desaturated to 89% on 2L while ambulating and does not feel comfortable going home in this condition.  I agree that patient would benefit from admission for further workup of COPD exac vs progressive pulmonary fibrosis.  [facility] to admit patient. - Continuous albuterol neb - Prednisone 60 mg - Admit patient.  Iline Oven, MD 07/30/15 1148  Elnora Morrison, MD 07/30/15  1650 

## 2015-07-30 NOTE — Consult Note (Signed)
   [facility] Inpatient Consult   07/30/2015  Ronald Bell 11-23-1940 [id number]   Patient is just recently active with [facility] for chronic disease management services for COPD, HX of HF. Medicare/ACO Registry. Call received from [facility].  Patient has been engaged by a [employer].  Patient just arriving to floor.  Will monitor for ongoing [facility] needs for post hospital follow up.  Active consent.  Patient will receive a post discharge transition of care call and will be evaluated for monthly home visits for assessments and disease process education.  Will make  Inpatient Case Manager aware that [facility] following for post hospital needs. Of note, [facility] Care Management services does not replace or interfere with any services that are needed or arranged by inpatient case management or social work.  For additional questions or referrals please contact:  Natividad Brood, RN BSN [facility] Liaison  [phone] business mobile phone Toll free office [phone]

## 2015-07-30 NOTE — Patient Outreach (Signed)
[facility]) Care Management  07/30/2015  JACOBE SKURA 02/20/1941 [id number]  Transition of care  RN attempted to call pt today however caregiver wife indicated pt current at the hospital and will be readmitted (SOB in ED via EPIC). RN will update the hospital liaison of pt's readmission and attempt case management services once again once pt discharges home.   Raina Mina, RN Care Management Coordinator [place] Office [phone]

## 2015-07-30 NOTE — H&P (Signed)
[facility] History and Physical  Ronald Bell [id number] DOB: 01-Nov-1940 DOA: 07/30/2015  Referring physician: edp PCP: Ann Held, DO   Chief Complaint: sob  HPI: Ronald Bell is a very pleasant 75 y.o. male  with a past medical history of paroxysmal A. fib, COPD on home O2 at night, CAD with stent, chronic diastolic CHF, hypertension, anxiety presents to emergency Department chief complaint acute on chronic shortness of breath. Initial evaluation reveals acute on chronic respiratory failure likely related to COPD exacerbation.  Information is obtained from the patient. He reports he's been hospitalized 2 times in the last month for same. He reports feeling better and at baseline at the time of discharge. His most recent discharge was 4 days ago when he reports he was doing "fine". He states he awakened this morning with sudden onset shortness of breath while in bed. He reports having no trouble sleeping or breathing during the night. He did nebulizer and called EMS. Initially he declined transport to the hospital but when he ambulated back to his bedroom he had worsening shortness of breath again. Denies any fever chills increased cough or sputum production. He denies chest pain palpitations headache dizziness syncope or near-syncope. He denies dysuria hematuria frequency or urgency. He denies lower extremity edema. He reports he does sleep propped up on 2 pillows.  In the emergency department he is afebrile hemodynamically stable with tachypnea and hypoxia. Oxygen saturation levels dropped 89% with ambulation on oxygen.  Review of Systems:  10 point review of systems complete and all systems are negative except as indicated   Past Medical History  Diagnosis Date  . Polyp of nasal cavity   . Rosacea   . Hx of adenomatous colonic polyps 2012, 2013.   . Tobacco abuse   . Hypertension   . Hyperlipidemia   . GERD (gastroesophageal reflux disease)   . GI  bleed 2010    4 units PRBCs  . Peripheral vascular disease ([place])   . Irregular heartbeat   . Shingles   . Carotid artery occlusion   . Angiodysplasia of intestine with hemorrhage     large and SB, gastric AVMs.   . Pleural plaque with presence of asbestos 03/27/2013    Followed in Pulmonary clinic/ [facility]/ [facility] - F/u CT 09/08/2013 1. Stable extensive calcified pleural plaque formation consistent with asbestos exposure. 2. Multiple pulmonary nodules are unchanged from the CT of 6 months ago. Given risk factors for lung cancer, continued follow up is recommended with chest CT in 6 months> done 04/20/14 no change >repeat in 12 m in tickle file     . GERD 11/30/2008    Qualifier: Diagnosis of  By: Marijean Niemann CMA, Danielle    . PVD (peripheral vascular disease) ([place]) 10/18/2012  . Carotid artery stenosis 04/22/2012  . Coronary artery disease   . COPD GOLD III with min reversibilty  08/11/2006    Followed in Pulmonary clinic/ [facility]/ [facility] - PFT's 04/28/2013  FEV1 0.88 (40%) with ratio 44 and 14% better p B2 dlco 45 corrects to 83 - Trial of breo 04/28/2013 > improved symptoms  06/09/2013  - spirometry 06/04/2014 FEV1  0.76 (29%) ratio 45      . Iron deficiency anemia 01/25/2009    Qualifier: Diagnosis of  By: Henrene Pastor MD, Docia Chuck   . History of blood transfusion "couple times"    "related to bleeding in colon and esophagus"  . Arthritis     "left shoulder" (10/19/2014)  .  Anxiety   . 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 On home oxygen therapy     "2L at night" (07/23/2015)   Past Surgical History  Procedure Laterality Date  . Iliac artery stent Left 2005    CIA  . Esophagogastroduodenoscopy  2012    normal  . Colonoscopy  July 2015    Dr. Henrene Pastor  . Tonsillectomy    . Knee arthroscopy with medial menisectomy Left 03/08/2014    Procedure: LEFT KNEE SCOPE WITH MEDIAL MENISECTOMY  AND CHONDROPLASTY;  Surgeon: Ninetta Lights, MD;  Location: [place];  Service: Orthopedics;  Laterality: Left;  . Left and right heart catheterization with coronary angiogram N/A 06/28/2014    Procedure: LEFT AND RIGHT HEART CATHETERIZATION WITH CORONARY ANGIOGRAM;  Surgeon: Jerline Pain, MD;  Location: [facility] CATH LAB;  Service: Cardiovascular;  Laterality: N/A;  . Esophagogastroduodenoscopy N/A 08/17/2014    Procedure: ESOPHAGOGASTRODUODENOSCOPY (EGD);  Surgeon: Jerene Bears, MD;  Location: [facility] ENDOSCOPY;  Service: Endoscopy;  Laterality: N/A;  . Colonoscopy N/A 08/17/2014    Procedure: COLONOSCOPY;  Surgeon: Jerene Bears, MD;  Location: [facility] ENDOSCOPY;  Service: Endoscopy;  Laterality: N/A;  . Cardiac catheterization  2001; 06/28/2014  . Cardiac catheterization N/A 10/19/2014    Procedure: Coronary Stent Intervention;  Surgeon: Burnell Blanks, MD;  Location: [place] CV LAB;  Service: Cardiovascular;  Laterality: N/A;  BMS Mid RCA  . Transcatheter aortic valve replacement, transapical N/A 11/20/2014    Procedure: TRANSCATHETER AORTIC VALVE REPLACEMENT, TRANSAPICAL;  Surgeon: [name], MD;  Location: [place];  Service: Open Heart Surgery;  Laterality: N/A;  . Tee without cardioversion N/A 11/20/2014    Procedure: TRANSESOPHAGEAL ECHOCARDIOGRAM (TEE);  Surgeon: [name], MD;  Location: [place];  Service: Open Heart Surgery;  Laterality: N/A;  . Rib plating Left 11/20/2014    Procedure: RIB PLATING OF LEFT 8TH RIB;  Surgeon: [name], MD;  Location: [place];  Service: Open Heart Surgery;  Laterality: Left;  . Colonoscopy N/A 12/05/2014    Procedure: COLONOSCOPY;  Surgeon: Manus Gunning, MD;  Location: [place];  Service: Gastroenterology;  Laterality: N/A;  . Enteroscopy N/A 12/05/2014    Procedure: ENTEROSCOPY;  Surgeon: Manus Gunning, MD;  Location: [place];  Service: Gastroenterology;  Laterality: N/A;   Social History:  reports that he quit smoking about 3  months ago. His smoking use included Cigarettes and E-cigarettes. He has a 60 pack-year smoking history. He has never used smokeless tobacco. He reports that he drinks about 8.4 oz of alcohol per week. He reports that he does not use illicit drugs. Is a retired Chief Financial Officer. He lives at home with his wife. He is independent with ADLs.  No Known Allergies  Family History  Problem Relation Age of Onset  . Lung disease Mother     pulm fibrosis  . Colitis Father   . Colon cancer Neg Hx   . Heart disease Brother   . Hypertension Brother   . Hyperlipidemia Brother   . CAD Daughter     cad  . Hypertension Son      Prior to Admission medications   Medication Sig Start Date End Date Taking? Authorizing Provider  albuterol (PROVENTIL HFA;VENTOLIN HFA) 108 (90 Base) MCG/ACT inhaler Inhale 2 puffs into the lungs every 4 (four) hours as needed for wheezing or shortness of breath. 05/23/15  Yes Rosalita Chessman Chase, DO  albuterol (PROVENTIL) (2.5 MG/3ML) 0.083% nebulizer solution Take 3 mLs (2.5 mg total) by nebulization every 4 (four) hours as needed for wheezing or shortness of breath. 07/26/15  Yes Reyne Dumas, MD  arformoterol (BROVANA) 15 MCG/2ML NEBU Take 2 mLs (15 mcg total) by nebulization 2 (two) times daily. 07/26/15  Yes Reyne Dumas, MD  Ascorbic Acid (VITAMIN C) 250 MG tablet Take 500 mg by mouth daily.    Yes Historical Provider, MD  aspirin 81 MG tablet Take 81 mg by mouth daily.   Yes Historical Provider, MD  atorvastatin (LIPITOR) 40 MG tablet TAKE ONE TABLET BY MOUTH ONCE DAILY 02/12/15  Yes Yvonne R Lowne Chase, DO  budesonide (PULMICORT) 0.25 MG/2ML nebulizer solution Inhale 2 mLs (0.25 mg total) into the lungs 2 (two) times daily. 07/27/15  Yes Reyne Dumas, MD  citalopram (CELEXA) 10 MG tablet Take 1 tablet (10 mg total) by mouth daily. 05/09/15  Yes Yvonne R Lowne Chase, DO  diclofenac sodium (VOLTAREN) 1 % GEL Apply 2 g topically 4 (four) times daily. Patient taking differently: Apply 2 g  topically 4 (four) times daily as needed (for pain).  04/25/15  Yes Yvonne R Lowne Chase, DO  docusate sodium (COLACE) 100 MG capsule Take 100 mg by mouth 2 (two) times daily.    Yes Historical Provider, MD  famotidine (PEPCID) 20 MG tablet Take 20 mg by mouth at bedtime.    Yes Historical Provider, MD  fenofibrate 160 MG tablet Take 1 tablet (160 mg total) by mouth daily. 05/15/15  Yes Alferd Apa Lowne Chase, DO  ferrous sulfate 325 (65 FE) MG EC tablet Take 1 tablet (325 mg total) by mouth 2 (two) times daily. 05/09/15  Yes Yvonne R Lowne Chase, DO  ipratropium-albuterol (DUONEB) 0.5-2.5 (3) MG/3ML SOLN Take 3 mLs by nebulization every 4 (four) hours as needed. Take Nebs three times a day for this week, after that continue as needed for shortness of breath or wheezing 07/26/15  Yes Reyne Dumas, MD  metoprolol tartrate (LOPRESSOR) 25 MG tablet Take 0.5 tablets (12.5 mg total) by mouth 2 (two) times daily. 05/09/15  Yes Yvonne R Lowne Chase, DO  Multiple Vitamins-Minerals (CENTRUM SILVER PO) Take 1 tablet by mouth daily.    Yes Historical Provider, MD  OXYGEN Inhale 2 L into the lungs at bedtime.    Yes Historical Provider, MD  pantoprazole (PROTONIX) 40 MG tablet Take 1 tablet (40 mg total) by mouth daily. 05/09/15  Yes Yvonne R Lowne Chase, DO  potassium chloride SA (K-DUR,KLOR-CON) 20 MEQ tablet Take 1 tablet (20 mEq total) by mouth daily. 05/09/15  Yes Yvonne R Lowne Chase, DO  temazepam (RESTORIL) 30 MG capsule TAKE ONE CAPSULE BY MOUTH AT BEDTIME AS NEEDED FOR SLEEP Patient taking differently: TAKE ONE CAPSULE BY MOUTH AT BEDTIME FOR SLEEP 07/16/15  Yes Yvonne R Lowne Chase, DO  ALPRAZolam Duanne Moron) 0.5 MG tablet TAKE ONE TABLET BY MOUTH THREE TIMES DAILY AS NEEDED FOR ANXIETY Patient not taking: Reported on 07/23/2015 07/22/15   Rosalita Chessman Chase, DO  doxycycline (VIBRA-TABS) 100 MG tablet Take 1 tablet (100 mg total) by mouth 2 (two) times daily. X 7days Patient not taking: Reported on 07/30/2015 07/22/15    Ripudeep Krystal Eaton, MD  furosemide (LASIX) 20 MG tablet Take 1 tablet (20 mg total) by mouth daily. Patient not taking: Reported on 07/23/2015 07/22/15   Ripudeep Krystal Eaton, MD  predniSONE (DELTASONE) 10 MG tablet Prednisone dosing:  Take  Prednisone 40mg  (4 tabs) x 2 days, then taper to 30mg  (3 tabs) x 3 days, then 20mg  (2 tabs) x 3days, then 10mg  (1 tab) x 3days, then OFF.  Dispense:  26 tabs, refills: None Patient not taking: Reported on 07/23/2015 07/23/15   Ripudeep Krystal Eaton, MD   Physical Exam: Filed Vitals:   07/30/15 1044 07/30/15 1100 07/30/15 1114 07/30/15 1115  BP: 128/82 112/82  106/51  Pulse: 100 94  95  Temp:      TempSrc:      Resp: 20 22  19   SpO2: 96% 95% 96% 93%    Wt Readings from Last 3 Encounters:  07/22/15 56.9 kg (125 lb 7.1 oz)  05/27/15 61.78 kg (136 lb 3.2 oz)  05/24/15 56.609 kg (124 lb 12.8 oz)    General:  Appears Somewhat anxious and comfortable Eyes: PERRL, normal lids, irises & conjunctiva ENT: grossly normal hearing, lips & tongue, because membranes of his mouth are pink  Neck: no LAD, masses or thyromegaly Cardiovascular: RRR, no m/r/g. No LE edema.  Respiratory: CTA bilaterally but distant throughout. Mild increased work of breathing with conversation.  Abdomen: soft, ntnd no guarding or rebounding positive bowel sounds throughout Skin: no rash or induration seen on limited exam Musculoskeletal: grossly normal tone BUE/BLE, joints without swelling/erythema  Psychiatric: grossly normal mood and affect, speech fluent and appropriate Neurologic: grossly non-focal. Speech clear facial symmetry moves all extremities           Labs on Admission:  Basic Metabolic Panel:  Recent Labs Lab 07/24/15 0228 07/25/15 0419 07/26/15 0231 07/30/15 0806  NA 142 141 142 141  K 4.2 3.8 3.8 4.9  CL 107 104 102 105  CO2 29 30 31 25   GLUCOSE 101* 88 102* 64*  BUN 21* 21* 20 28*  CREATININE 1.07 1.01 1.01 0.95  CALCIUM 9.2 9.4 9.5 9.4   Liver Function Tests: No  results for input(s): AST, ALT, ALKPHOS, BILITOT, PROT, ALBUMIN in the last 168 hours. No results for input(s): LIPASE, AMYLASE in the last 168 hours. No results for input(s): AMMONIA in the last 168 hours. CBC:  Recent Labs Lab 07/24/15 0228 07/30/15 0806  WBC 8.8 11.9*  HGB 10.7* 12.0*  HCT 32.8* 36.8*  MCV 99.4 101.7*  PLT 230 279   Cardiac Enzymes: No results for input(s): CKTOTAL, CKMB, CKMBINDEX, TROPONINI in the last 168 hours.  BNP (last 3 results)  Recent Labs  07/20/15 2045 07/23/15 0039 07/30/15 0806  BNP 64.8 259.6* 68.0    ProBNP (last 3 results) No results for input(s): PROBNP in the last 8760 hours.  CBG: No results for input(s): GLUCAP in the last 168 hours.  Radiological Exams on Admission: Dg Chest Port 1 View  07/30/2015  CLINICAL DATA:  Shortness of Breath, transcatheter aortic valve replacement last year EXAM: PORTABLE CHEST 1 VIEW COMPARISON:  07/23/2015 FINDINGS: Cardiomediastinal silhouette is stable. Hyperinflation and fibrotic changes again noted bilaterally. Stable bilateral calcified pleural plaques. Again noted streaky atelectasis and fibrotic changes in lingula. No definite superimposed infiltrate. No pulmonary edema. Atherosclerotic calcifications of thoracic aorta. IMPRESSION: Hyperinflation and fibrotic changes again noted bilaterally. Stable bilateral calcified pleural plaques. Again noted streaky atelectasis and fibrotic changes in lingula. No definite superimposed infiltrate. No pulmonary edema. Electronically Signed   By: Lahoma Crocker M.D.   On: 07/30/2015 08:24    EKG: Independently reviewed. Sinus rhythm Borderline T abnormalities, lateral leads Similar to previous  Assessment/Plan Principal Problem:   Acute on chronic respiratory  failure (HCC) Active Problems:   Iron deficiency anemia   Essential hypertension   COPD GOLD III with min reversibilty    GERD   COPD exacerbation (HCC)   Aortic stenosis, severe   CAD (coronary artery  disease)   1. Acute on chronic respiratory failure likely related to COPD exacerbation. Oxygen saturation level dropped 89% with ambulation on room air while in the emergency department. Increased oxygen need. Chest x-ray without acute abnormalities. No infiltrate no pulmonary edema. Chart review indicates PFTs in 2016 withFEV1 40%  -Admit to telemetry  -Scheduled nebs  -Solu-Medrol 60 mg IV every 6  -Flutter valve  -We'll ambulate tomorrow and recheck oxygen. May need to go home on oxygen 24 7  -Dr Marily Memos discussed with Dr wert who recommended standard therapies and follow up in 2 days.  #2. COPD with exacerbation. Patient denies any obvious triggers. States he was doing fine until this morning with sudden onset. Home medications include Bevespi aerosphere with Brovan and incruse -Oxygen supplementation  -Monitor oxygen saturation level  -Has follow-up appointment with pulmonology in 2 days   #3. GERD. Stable at baseline.   #4. CAD. Cath in July 2016 revealed 95% stenosis of mid RCA . He denies any chest pain palpitations. EKG without acute changes. Initial troponin within the limits of normal  -Continue aspirin and Lipitor  -Monitor on telemetry   #5. paroxysmal A. Fib. chadvasc score 5. In sinus rhythm on admission. On low-dose aspirin. Home medications include metoprolol  -Continue home meds   6. Chronic diastolic heart failure. TTE October 2016 with an EF of [id number] grade 1 diastolic dysfunction. She reports no longer on Lasix. Compensated. -Monitor daily weights -Strict intake and output -Continue beta blocker  7. Hypertension. Controlled in the emergency department -Continue home meds   Discussed with Dr [name] per Dr Marily Memos    Code Status: full DVT Prophylaxis: Family Communication: none present Disposition Plan: home  Time spent: 38 minutes  [facility]

## 2015-07-30 NOTE — ED Notes (Signed)
Pulse oximetry at 89% while ambulating.  Pulse at 119 beats/minute

## 2015-07-30 NOTE — ED Notes (Signed)
X-ray at bedside

## 2015-07-30 NOTE — Progress Notes (Signed)
Pt A/Ox4. Pt instructed of high risk fall potential while hospitalized. Pt refused bed alarm. Will continue to utilize other fall risk precautions with pt.

## 2015-07-31 ENCOUNTER — Telehealth: Payer: Self-pay | Admitting: Internal Medicine

## 2015-07-31 DIAGNOSIS — I350 Nonrheumatic aortic (valve) stenosis: Secondary | ICD-10-CM | POA: Diagnosis not present

## 2015-07-31 DIAGNOSIS — E785 Hyperlipidemia, unspecified: Secondary | ICD-10-CM | POA: Diagnosis present

## 2015-07-31 DIAGNOSIS — K219 Gastro-esophageal reflux disease without esophagitis: Secondary | ICD-10-CM | POA: Diagnosis present

## 2015-07-31 DIAGNOSIS — I739 Peripheral vascular disease, unspecified: Secondary | ICD-10-CM | POA: Diagnosis present

## 2015-07-31 DIAGNOSIS — I480 Paroxysmal atrial fibrillation: Secondary | ICD-10-CM | POA: Diagnosis present

## 2015-07-31 DIAGNOSIS — Z952 Presence of prosthetic heart valve: Secondary | ICD-10-CM | POA: Diagnosis not present

## 2015-07-31 DIAGNOSIS — Z87891 Personal history of nicotine dependence: Secondary | ICD-10-CM | POA: Diagnosis not present

## 2015-07-31 DIAGNOSIS — Z9981 Dependence on supplemental oxygen: Secondary | ICD-10-CM | POA: Diagnosis not present

## 2015-07-31 DIAGNOSIS — I2510 Atherosclerotic heart disease of native coronary artery without angina pectoris: Secondary | ICD-10-CM

## 2015-07-31 DIAGNOSIS — Z955 Presence of coronary angioplasty implant and graft: Secondary | ICD-10-CM | POA: Diagnosis not present

## 2015-07-31 DIAGNOSIS — Z79899 Other long term (current) drug therapy: Secondary | ICD-10-CM | POA: Diagnosis not present

## 2015-07-31 DIAGNOSIS — J441 Chronic obstructive pulmonary disease with (acute) exacerbation: Secondary | ICD-10-CM | POA: Diagnosis not present

## 2015-07-31 DIAGNOSIS — J9620 Acute and chronic respiratory failure, unspecified whether with hypoxia or hypercapnia: Secondary | ICD-10-CM | POA: Diagnosis not present

## 2015-07-31 DIAGNOSIS — R0602 Shortness of breath: Secondary | ICD-10-CM | POA: Diagnosis not present

## 2015-07-31 DIAGNOSIS — I110 Hypertensive heart disease with heart failure: Secondary | ICD-10-CM | POA: Diagnosis present

## 2015-07-31 DIAGNOSIS — J9621 Acute and chronic respiratory failure with hypoxia: Secondary | ICD-10-CM | POA: Diagnosis not present

## 2015-07-31 DIAGNOSIS — I5032 Chronic diastolic (congestive) heart failure: Secondary | ICD-10-CM | POA: Diagnosis present

## 2015-07-31 DIAGNOSIS — Z7982 Long term (current) use of aspirin: Secondary | ICD-10-CM | POA: Diagnosis not present

## 2015-07-31 DIAGNOSIS — J8410 Pulmonary fibrosis, unspecified: Secondary | ICD-10-CM | POA: Diagnosis present

## 2015-07-31 DIAGNOSIS — D509 Iron deficiency anemia, unspecified: Secondary | ICD-10-CM | POA: Diagnosis present

## 2015-07-31 DIAGNOSIS — Z77090 Contact with and (suspected) exposure to asbestos: Secondary | ICD-10-CM | POA: Diagnosis present

## 2015-07-31 LAB — BASIC METABOLIC PANEL
Anion gap: 10 (ref 5–15)
BUN: 24 mg/dL — ABNORMAL HIGH (ref 6–20)
CALCIUM: 9.4 mg/dL (ref 8.9–10.3)
CO2: 25 mmol/L (ref 22–32)
CREATININE: 0.98 mg/dL (ref 0.61–1.24)
Chloride: 106 mmol/L (ref 101–111)
Glucose, Bld: 148 mg/dL — ABNORMAL HIGH (ref 65–99)
Potassium: 4.2 mmol/L (ref 3.5–5.1)
SODIUM: 141 mmol/L (ref 135–145)

## 2015-07-31 LAB — CBC
HCT: 34 % — ABNORMAL LOW (ref 39.0–52.0)
Hemoglobin: 11.2 g/dL — ABNORMAL LOW (ref 13.0–17.0)
MCH: 32.2 pg (ref 26.0–34.0)
MCHC: 32.9 g/dL (ref 30.0–36.0)
MCV: 97.7 fL (ref 78.0–100.0)
PLATELETS: 262 10*3/uL (ref 150–400)
RBC: 3.48 MIL/uL — AB (ref 4.22–5.81)
RDW: 13 % (ref 11.5–15.5)
WBC: 11.1 10*3/uL — AB (ref 4.0–10.5)

## 2015-07-31 MED ORDER — IPRATROPIUM-ALBUTEROL 0.5-2.5 (3) MG/3ML IN SOLN
3.0000 mL | Freq: Three times a day (TID) | RESPIRATORY_TRACT | Status: DC
  Administered 2015-07-31 – 2015-08-01 (×4): 3 mL via RESPIRATORY_TRACT
  Filled 2015-07-31 (×4): qty 3

## 2015-07-31 MED ORDER — BUDESONIDE 0.25 MG/2ML IN SUSP
0.2500 mg | Freq: Two times a day (BID) | RESPIRATORY_TRACT | Status: DC
  Administered 2015-07-31 – 2015-08-02 (×4): 0.25 mg via RESPIRATORY_TRACT
  Filled 2015-07-31 (×4): qty 2

## 2015-07-31 NOTE — Progress Notes (Signed)
Text paged Dr. Marthenia Rolling r/t pt being very anxious about a physician coming to see him and explaining his plan of care to him. Also, he wants his Dr. Sherral Hammers, Pulmonologist, to round on him.

## 2015-07-31 NOTE — Evaluation (Signed)
Physical Therapy Evaluation Patient Details Name: Ronald Bell MRN: [id number] DOB: December 03, 1940 Today's Date: 07/31/2015   History of Present Illness  HPI: Ronald Bell is a very pleasant 75 y.o. male with a past medical history of paroxysmal A. fib, COPD on home O2 at night, CAD with stent, chronic diastolic CHF, hypertension, anxiety presents to emergency Department chief complaint acute on chronic shortness of breath. Initial evaluation reveals acute on chronic respiratory failure likely related to COPD exacerbation.  Clinical Impression   Patient evaluated by Physical Therapy with no further acute PT needs identified. All education has been completed and the patient has no further questions.  See below for any follow-up Physical Therapy or equipment needs. PT is signing off. Thank you for this referral.     Follow Up Recommendations Supervision - Intermittent;Home health PT  Worth considering [facility] for chronic disease management    Equipment Recommendations  None recommended by PT    Recommendations for Other Services       Precautions / Restrictions Restrictions Weight Bearing Restrictions: No      Mobility  Bed Mobility Overal bed mobility: Independent                Transfers Overall transfer level: Modified independent Equipment used: None Transfers: Sit to/from Stand Sit to Stand: Modified independent (Device/Increase time)         General transfer comment: No difficulty noted  Ambulation/Gait Ambulation/Gait assistance: Supervision;Modified independent (Device/Increase time) Ambulation Distance (Feet): 200 Feet Assistive device: None (and pushing Dinamap) Gait Pattern/deviations: Step-through pattern   Gait velocity interpretation: Below normal speed for age/gender General Gait Details: amb on Room Air and O2 sats reamined greater than or equal to 92%; Dyspnea 2/4; demonstrates good self-monitor for activity tolerance  Stairs            Wheelchair Mobility    Modified Rankin (Stroke Patients Only)       Balance             Standing balance-Leahy Scale: Good                               Pertinent Vitals/Pain Pain Assessment: No/denies pain    Home Living Family/patient expects to be discharged to:: Private residence Living Arrangements: Spouse/significant other Available Help at Discharge: Family;Available 24 hours/day Type of Home: House Home Access: Stairs to enter Entrance Stairs-Rails: None Entrance Stairs-Number of Steps: 1 Home Layout: One level Home Equipment: Walker - 2 wheels;Shower seat;Cane - single point      Prior Function Level of Independence: Independent               Hand Dominance   Dominant Hand: Left    Extremity/Trunk Assessment   Upper Extremity Assessment: Overall WFL for tasks assessed           Lower Extremity Assessment: Overall WFL for tasks assessed      Cervical / Trunk Assessment: Kyphotic  Communication   Communication: No difficulties  Cognition Arousal/Alertness: Awake/alert Behavior During Therapy: WFL for tasks assessed/performed Overall Cognitive Status: Within Functional Limits for tasks assessed                      General Comments      Exercises        Assessment/Plan    PT Assessment Patent does not need any further PT services;All further PT needs can be met in the next  venue of care  PT Diagnosis Difficulty walking   PT Problem List    PT Treatment Interventions     PT Goals (Current goals can be found in the Care Plan section) Acute Rehab PT Goals Patient Stated Goal: get breathing figured out PT Goal Formulation: All assessment and education complete, DC therapy    Frequency     Barriers to discharge        Co-evaluation               End of Session   Activity Tolerance: Patient tolerated treatment well Patient left: Other (comment) (Managing independently in room) Nurse  Communication: Mobility status    Functional Assessment Tool Used: Clinical Judgement Functional Limitation: Mobility: Walking and moving around Mobility: Walking and Moving Around Current Status [phone]): 0 percent impaired, limited or restricted Mobility: Walking and Moving Around Goal Status [phone]): 0 percent impaired, limited or restricted Mobility: Walking and Moving Around Discharge Status [phone]): 0 percent impaired, limited or restricted    Time: 5621-3086 PT Time Calculation (min) (ACUTE ONLY): 14 min   Charges:   PT Evaluation $PT Eval Low Complexity: 1 Procedure     PT G Codes:   PT G-Codes **NOT FOR INPATIENT CLASS** Functional Assessment Tool Used: Clinical Judgement Functional Limitation: Mobility: Walking and moving around Mobility: Walking and Moving Around Current Status ([id number]): 0 percent impaired, limited or restricted Mobility: Walking and Moving Around Goal Status ([id number]): 0 percent impaired, limited or restricted Mobility: Walking and Moving Around Discharge Status [phone]): 0 percent impaired, limited or restricted    Roney Marion [facility] 07/31/2015, 10:19 AM  Roney Marion, [place] Pager [phone] Office [phone]

## 2015-07-31 NOTE — Telephone Encounter (Signed)
Dr. [name], patient is in the hospital again for the 3rd time and he is requesting that you come by his room and see him while he is there. [id number]; [place].  Patient Instructions     Plan A = Automatic = stop Anoro and try BEVESPI Take 2 puffs first thing in am and then another 2 puffs about 12 hours later.   Change protonix to 40mg  Take 30-60 min before first meal of the day and add Pepcid ac 20 mg at bedtime    Plan B = Backup Only use your albuterol as a rescue medication to be used if you can't catch your breath by resting or doing a relaxed purse lip breathing pattern.  - The less you use it, the better it will work when you need it. - Ok to use up to 2 puffs every 4 hours if you must but call for appointment if use goes up over your usual need - Don't leave home without it !! (think of it like the spare tire for your car)   Plan C = Crisis - only use your albuterol nebulizer if you first try Plan B and it fails to help > ok to use the nebulizer up to every 4 hours but if start needing it regularly call for immediate appointment   Plan D = Doctor - call me if B and C not adequate  Plan E = ER - go to ER or call 911 if all else fails   Please schedule a follow up office visit in 6 weeks, call sooner if needed

## 2015-07-31 NOTE — Progress Notes (Signed)
PROGRESS NOTE    Ronald Bell  [id number] DOB: [birth year] DOA: 07/30/2015 PCP: Ann Held, DO  Outpatient Specialists:   Brief Narrative:  75 y.o. 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    Assessment & Plan: Principal Problem:   Acute on chronic respiratory failure (HCC) Active Problems:   Iron deficiency anemia   Essential hypertension   COPD GOLD III with min reversibilty    GERD   COPD exacerbation (HCC)   Aortic stenosis, severe   CAD (coronary artery disease)   Pulmonary fibrosis (HCC)  Principal Problem:  Acute on chronic respiratory failure (HCC) Active Problems:  Iron deficiency anemia  Essential hypertension  COPD GOLD III with min reversibilty   GERD  COPD exacerbation (HCC)  Aortic stenosis, severe  CAD (coronary artery disease)  - Acute on chronic respiratory failure likely related to COPD exacerbation. Continue current medication.  - COPD with exacerbation - Continue current regimen. Add Nebs Pulmicort. Pulmonary consult called. - GERD. Stable at baseline.  - CAD- Stable. - Paroxysmal A. Fib.   - Chronic diastolic heart failure, compensated. - Hypertension - Optimize  Code Status: full DVT Prophylaxis: Family Communication: none present Disposition Plan: home   Subjective: Slowly improving. Patient insists on my call his Pulmonologist (Dr. Christinia Gully). Pulmonary office called.  Objective: Filed Vitals:   07/31/15 0502 07/31/15 0809 07/31/15 0934 07/31/15 1200  BP: 145/65  110/50 146/60  Pulse: 71  80 70  Temp: 97.5 F (36.4 C)  97.3 F (36.3 C) 98.1 F (36.7 C)  TempSrc: Oral  Oral Oral  Resp: 19  20 20   Height:      Weight: 60.238 kg (132 lb 12.8 oz)     SpO2: 96% 95% 96% 97%    Intake/Output Summary (Last 24  hours) at 07/31/15 1732 Last data filed at 07/31/15 1352  Gross per 24 hour  Intake   1135 ml  Output    900 ml  Net    235 ml   Filed Weights   07/30/15 1222 07/31/15 0502  Weight: 60.419 kg (133 lb 3.2 oz) 60.238 kg (132 lb 12.8 oz)    Examination:  General exam: Not in distress. Awake and alert.  Respiratory system: Decreased air entry with wheezing.  Cardiovascular system: S1 & S2   Gastrointestinal system: Abdomen is nondistended, soft and nontender.   Central nervous system: Alert and oriented. No focal neurological deficits. Extremities: No edema.   Data Reviewed: I have personally reviewed following labs and imaging studies  CBC:  Recent Labs Lab 07/30/15 0806 07/31/15 0335  WBC 11.9* 11.1*  HGB 12.0* 11.2*  HCT 36.8* 34.0*  MCV 101.7* 97.7  PLT 279 99991111   Basic Metabolic Panel:  Recent Labs Lab 07/25/15 0419 07/26/15 0231 07/30/15 0806 07/31/15 0335  NA 141 142 141 141  K 3.8 3.8 4.9 4.2  CL 104 102 105 106  CO2 30 31 25 25   GLUCOSE 88 102* 64* 148*  BUN 21* 20 28* 24*  CREATININE 1.01 1.01 0.95 0.98  CALCIUM 9.4 9.5 9.4 9.4   GFR: Estimated Creatinine Clearance: 49.5 mL/min (by C-G formula based on Cr of 0.98). Liver Function Tests: No results for input(s): AST, ALT, ALKPHOS, BILITOT, PROT, ALBUMIN in the last 168 hours. No results for input(s): LIPASE, AMYLASE in  the last 168 hours. No results for input(s): AMMONIA in the last 168 hours. Coagulation Profile: No results for input(s): INR, PROTIME in the last 168 hours. Cardiac Enzymes: No results for input(s): CKTOTAL, CKMB, CKMBINDEX, TROPONINI in the last 168 hours. BNP (last 3 results) No results for input(s): PROBNP in the last 8760 hours. HbA1C: No results for input(s): HGBA1C in the last 72 hours. CBG: No results for input(s): GLUCAP in the last 168 hours. Lipid Profile: No results for input(s): CHOL, HDL, LDLCALC, TRIG, CHOLHDL, LDLDIRECT in the last 72 hours. Thyroid Function  Tests: No results for input(s): TSH, T4TOTAL, FREET4, T3FREE, THYROIDAB in the last 72 hours. Anemia Panel: No results for input(s): VITAMINB12, FOLATE, FERRITIN, TIBC, IRON, RETICCTPCT in the last 72 hours. Urine analysis:    Component Value Date/Time   COLORURINE YELLOW 11/16/2014 1058   APPEARANCEUR CLEAR 11/16/2014 1058   LABSPEC 1.015 11/16/2014 1058   PHURINE 6.5 11/16/2014 1058   GLUCOSEU NEGATIVE 11/16/2014 1058   HGBUR NEGATIVE 11/16/2014 1058   HGBUR negative 11/30/2008 0000   BILIRUBINUR neg 05/09/2015 1106   BILIRUBINUR NEGATIVE 11/16/2014 1058   KETONESUR NEGATIVE 11/16/2014 1058   PROTEINUR neg 05/09/2015 1106   PROTEINUR NEGATIVE 11/16/2014 1058   UROBILINOGEN 0.2 05/09/2015 1106   UROBILINOGEN 0.2 11/16/2014 1058   NITRITE neg 05/09/2015 1106   NITRITE NEGATIVE 11/16/2014 1058   LEUKOCYTESUR Negative 05/09/2015 1106   Sepsis Labs: @LABRCNTIP (procalcitonin:4,lacticidven:4)  )No results found for this or any previous visit (from the past 240 hour(s)).       Radiology Studies: Dg Chest Port 1 View  07/30/2015  CLINICAL DATA:  Shortness of Breath, transcatheter aortic valve replacement last year EXAM: PORTABLE CHEST 1 VIEW COMPARISON:  07/23/2015 FINDINGS: Cardiomediastinal silhouette is stable. Hyperinflation and fibrotic changes again noted bilaterally. Stable bilateral calcified pleural plaques. Again noted streaky atelectasis and fibrotic changes in lingula. No definite superimposed infiltrate. No pulmonary edema. Atherosclerotic calcifications of thoracic aorta. IMPRESSION: Hyperinflation and fibrotic changes again noted bilaterally. Stable bilateral calcified pleural plaques. Again noted streaky atelectasis and fibrotic changes in lingula. No definite superimposed infiltrate. No pulmonary edema. Electronically Signed   By: Lahoma Crocker M.D.   On: 07/30/2015 08:24        Scheduled Meds: . aspirin EC  81 mg Oral Daily  . atorvastatin  40 mg Oral Daily  .  budesonide (PULMICORT) nebulizer solution  0.25 mg Nebulization BID  . citalopram  10 mg Oral Daily  . docusate sodium  100 mg Oral BID  . enoxaparin (LOVENOX) injection  40 mg Subcutaneous Q24H  . famotidine  20 mg Oral QHS  . fenofibrate  160 mg Oral Daily  . ferrous sulfate  325 mg Oral BID  . ipratropium-albuterol  3 mL Nebulization TID  . methylPREDNISolone (SOLU-MEDROL) injection  60 mg Intravenous Q6H  . metoprolol tartrate  12.5 mg Oral BID  . pantoprazole  40 mg Oral Daily  . sodium chloride flush  3 mL Intravenous Q12H  . vitamin C  500 mg Oral Daily   Continuous Infusions:       Time spent: 40 Mins   Bonnell Public, MD [facility] Pager [phone]  If 7PM-7AM, please contact night-coverage [email or web address] Password [facility] 07/31/2015, 5:32 PM

## 2015-07-31 NOTE — Consult Note (Addendum)
   [facility] CM Inpatient Consult   07/31/2015  Ronald Bell 10/01/1940 [id number] Received a call from Manus Gunning with [place] program [phone] regarding patient is being followed by their program.  Patient will have full care management services through this Palliative Care program and [facility] Management will not follow this patient for transition of care needs.  Will notify [place] Management office of patient's program. Will update Inpatient RNCM, as well.   For questions, please contact:  Natividad Brood, RN BSN [facility] Liaison  [phone] business mobile phone Toll free office [phone]  1445: Patient and inpatient RNCM made aware of [facility] involvement post hospital. Natividad Brood, RN BSN [facility] Liaison  [phone] business mobile phone Toll free office [phone]

## 2015-07-31 NOTE — Care Management Obs Status (Signed)
[facility] NOTIFICATION   Patient Details  Name: NAPOLEAN SIDMAN MRN: [id number] Date of Birth: 1940-12-07   Medicare Observation Status Notification Given:  Yes    Royston Bake, RN 07/31/2015, 3:10 PM

## 2015-07-31 NOTE — Patient Care Conference (Signed)
Patient refused bed alarm. Will continue to monitor.  

## 2015-07-31 NOTE — Consult Note (Signed)
   [facility] Inpatient Consult   07/31/2015  Ronald Bell 07/12/1940 [id number]   Active [recent new patient] with [facility] Care Management prior to admission:  Met with the patient at the bedside.  Patient states that he felt like he was doing pretty good for a day or so and woke up yesterday with an overwhelming feeling of shortness of breath.  He states he used his inhalers and nebulizer and EMS came and checked him out and he felt if he could lie down he would not have to come to the hospital.  He states he lied down and had that overwhelming feeling of shortness of breath so he told EMS he felt that he needed to go get checked out.  Patient states he has not been out of the hospital long enough to follow up with his Pulmonologist, Dr. [name].  Patient consents to ongoing community follow up with [facility].  He states, "I need to see what's really going on, this is very concerning to not know what's going on."  Contact information given.  Of note, [facility] Care Management does not interfere with or replace any services needed or arranged by inpatient care management team.  For questions, please contact:  Natividad Brood, RN BSN [facility] Liaison  [phone] business mobile phone Toll free office [phone]

## 2015-07-31 NOTE — Progress Notes (Signed)
OT Cancellation Note  Patient Details Name: Ronald Bell MRN: [id number] DOB: 12/06/1940   Cancelled Treatment:    Reason Eval/Treat Not Completed: OT screened, no needs identified, will sign off - Pt has been ambulated with nursing, and has been performing ADLs without assist.    [name] [employer], OTR/L [id number]  07/31/2015, 4:52 PM

## 2015-08-01 DIAGNOSIS — J441 Chronic obstructive pulmonary disease with (acute) exacerbation: Principal | ICD-10-CM

## 2015-08-01 DIAGNOSIS — J9620 Acute and chronic respiratory failure, unspecified whether with hypoxia or hypercapnia: Secondary | ICD-10-CM

## 2015-08-01 LAB — GLUCOSE, CAPILLARY: GLUCOSE-CAPILLARY: 195 mg/dL — AB (ref 65–99)

## 2015-08-01 MED ORDER — ARFORMOTEROL TARTRATE 15 MCG/2ML IN NEBU
15.0000 ug | INHALATION_SOLUTION | Freq: Two times a day (BID) | RESPIRATORY_TRACT | Status: DC
  Administered 2015-08-01 – 2015-08-02 (×2): 15 ug via RESPIRATORY_TRACT
  Filled 2015-08-01 (×2): qty 2

## 2015-08-01 MED ORDER — METHYLPREDNISOLONE SODIUM SUCC 125 MG IJ SOLR
60.0000 mg | Freq: Two times a day (BID) | INTRAMUSCULAR | Status: DC
  Administered 2015-08-02: 60 mg via INTRAVENOUS
  Filled 2015-08-01 (×2): qty 2

## 2015-08-01 MED ORDER — UMECLIDINIUM-VILANTEROL 62.5-25 MCG/INH IN AEPB
1.0000 | INHALATION_SPRAY | Freq: Every day | RESPIRATORY_TRACT | Status: DC
  Filled 2015-08-01: qty 14

## 2015-08-01 MED ORDER — UMECLIDINIUM BROMIDE 62.5 MCG/INH IN AEPB
1.0000 | INHALATION_SPRAY | Freq: Every day | RESPIRATORY_TRACT | Status: DC
  Administered 2015-08-01 – 2015-08-02 (×2): 1 via RESPIRATORY_TRACT
  Filled 2015-08-01: qty 7

## 2015-08-01 MED ORDER — IPRATROPIUM-ALBUTEROL 0.5-2.5 (3) MG/3ML IN SOLN: 3.0000 mL | Freq: Four times a day (QID) | RESPIRATORY_TRACT | Status: DC | PRN

## 2015-08-01 MED ORDER — ROFLUMILAST 500 MCG PO TABS
500.0000 ug | ORAL_TABLET | Freq: Every day | ORAL | Status: DC
  Administered 2015-08-01 – 2015-08-02 (×2): 500 ug via ORAL
  Filled 2015-08-01 (×2): qty 1

## 2015-08-01 NOTE — Progress Notes (Signed)
Pt educated on the safety and importance of using bed/chair alarms to prevent falls.  Pt continues to refuse both.

## 2015-08-01 NOTE — Progress Notes (Signed)
Patient refused bed alarm. Will continue to monitor patient. 

## 2015-08-01 NOTE — Progress Notes (Signed)
PROGRESS NOTE    Ronald Bell  [id number] DOB: 1940-06-20 DOA: 07/30/2015 PCP: Ann Held, DO  Outpatient Specialists:   Brief Narrative:  75 y.o. 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    Assessment & Plan: Principal Problem:   Acute on chronic respiratory failure (HCC) Active Problems:   Iron deficiency anemia   Essential hypertension   COPD GOLD III with min reversibilty    GERD   COPD exacerbation (HCC)   Aortic stenosis, severe   CAD (coronary artery disease)   Pulmonary fibrosis (HCC)  Principal Problem:  Acute on chronic respiratory failure (HCC) Active Problems:  Iron deficiency anemia  Essential hypertension  COPD GOLD III with min reversibilty   GERD  COPD exacerbation (HCC)  Aortic stenosis, severe  CAD (coronary artery disease)  - Acute on chronic respiratory failure likely related to COPD exacerbation. Continue current medication.  - COPD with exacerbation - Continue current regimen. Pulmonary consulted. - GERD. Stable at baseline.  - CAD- Stable. - Paroxysmal A. Fib.   - Chronic diastolic heart failure, compensated. - Hypertension - Optimize  Code Status: full DVT Prophylaxis: Family Communication: none present Disposition Plan: home   Subjective: Slowly improving. Patient insists on my call his Pulmonologist (Dr. Christinia Gully). Pulmonary office called.  Objective: Filed Vitals:   07/31/15 2030 07/31/15 2242 08/01/15 0538 08/01/15 1504  BP:  150/64  119/67  Pulse:  86  73  Temp:  97.5 F (36.4 C)  97.6 F (36.4 C)  TempSrc:  Oral  Oral  Resp:  20  20  Height:      Weight:   60.51 kg (133 lb 6.4 oz)   SpO2: 96% 95%  96%    Intake/Output Summary (Last 24 hours) at 08/01/15 1715 Last data filed at 08/01/15 1506  Gross per 24 hour  Intake   1970 ml  Output   1725 ml  Net    245 ml   Filed Weights   07/30/15 1222 07/31/15 0502 08/01/15 0538  Weight: 60.419 kg (133 lb 3.2 oz) 60.238 kg (132 lb 12.8 oz) 60.51 kg (133 lb 6.4 oz)    Examination:  General exam: Not in distress. Awake and alert.  Respiratory system: Decreased air entry with wheezing.  Cardiovascular system: S1 & S2   Gastrointestinal system: Abdomen is nondistended, soft and nontender.   Central nervous system: Alert and oriented. No focal neurological deficits. Extremities: No edema.   Data Reviewed: I have personally reviewed following labs and imaging studies  CBC:  Recent Labs Lab 07/30/15 0806 07/31/15 0335  WBC 11.9* 11.1*  HGB 12.0* 11.2*  HCT 36.8* 34.0*  MCV 101.7* 97.7  PLT 279 99991111   Basic Metabolic Panel:  Recent Labs Lab 07/26/15 0231 07/30/15 0806 07/31/15 0335  NA 142 141 141  K 3.8 4.9 4.2  CL 102 105 106  CO2 31 25 25   GLUCOSE 102* 64* 148*  BUN 20 28* 24*  CREATININE 1.01 0.95 0.98  CALCIUM 9.5 9.4 9.4   GFR: Estimated Creatinine Clearance: 49.5 mL/min (by C-G formula based on Cr of 0.98). Liver Function Tests: No results for input(s): AST, ALT, ALKPHOS, BILITOT, PROT, ALBUMIN in the last 168 hours. No results for input(s): LIPASE, AMYLASE in the last 168 hours. No results for input(s): AMMONIA in the  last 168 hours. Coagulation Profile: No results for input(s): INR, PROTIME in the last 168 hours. Cardiac Enzymes: No results for input(s): CKTOTAL, CKMB, CKMBINDEX, TROPONINI in the last 168 hours. BNP (last 3 results) No results for input(s): PROBNP in the last 8760 hours. HbA1C: No results for input(s): HGBA1C in the last 72 hours. CBG:  Recent Labs Lab 08/01/15 1114  GLUCAP 195*   Lipid Profile: No results for input(s): CHOL, HDL, LDLCALC, TRIG, CHOLHDL, LDLDIRECT in the last 72 hours. Thyroid Function Tests: No results for input(s): TSH, T4TOTAL, FREET4, T3FREE, THYROIDAB in  the last 72 hours. Anemia Panel: No results for input(s): VITAMINB12, FOLATE, FERRITIN, TIBC, IRON, RETICCTPCT in the last 72 hours. Urine analysis:    Component Value Date/Time   COLORURINE YELLOW 11/16/2014 1058   APPEARANCEUR CLEAR 11/16/2014 1058   LABSPEC 1.015 11/16/2014 1058   PHURINE 6.5 11/16/2014 1058   GLUCOSEU NEGATIVE 11/16/2014 1058   HGBUR NEGATIVE 11/16/2014 1058   HGBUR negative 11/30/2008 0000   BILIRUBINUR neg 05/09/2015 1106   BILIRUBINUR NEGATIVE 11/16/2014 1058   KETONESUR NEGATIVE 11/16/2014 1058   PROTEINUR neg 05/09/2015 1106   PROTEINUR NEGATIVE 11/16/2014 1058   UROBILINOGEN 0.2 05/09/2015 1106   UROBILINOGEN 0.2 11/16/2014 1058   NITRITE neg 05/09/2015 1106   NITRITE NEGATIVE 11/16/2014 1058   LEUKOCYTESUR Negative 05/09/2015 1106   Sepsis Labs: @LABRCNTIP (procalcitonin:4,lacticidven:4)  )No results found for this or any previous visit (from the past 240 hour(s)).       Radiology Studies: No results found.      Scheduled Meds: . aspirin EC  81 mg Oral Daily  . atorvastatin  40 mg Oral Daily  . budesonide (PULMICORT) nebulizer solution  0.25 mg Nebulization BID  . citalopram  10 mg Oral Daily  . docusate sodium  100 mg Oral BID  . enoxaparin (LOVENOX) injection  40 mg Subcutaneous Q24H  . famotidine  20 mg Oral QHS  . fenofibrate  160 mg Oral Daily  . ferrous sulfate  325 mg Oral BID  . ipratropium-albuterol  3 mL Nebulization TID  . methylPREDNISolone (SOLU-MEDROL) injection  60 mg Intravenous Q6H  . metoprolol tartrate  12.5 mg Oral BID  . pantoprazole  40 mg Oral Daily  . sodium chloride flush  3 mL Intravenous Q12H  . vitamin C  500 mg Oral Daily   Continuous Infusions:    LOS: 1 day    Time spent: 40 Mins   Bonnell Public, MD [facility] Pager [phone]  If 7PM-7AM, please contact night-coverage [email or web address] Password [email or web address] 08/01/2015, 5:15 PM

## 2015-08-01 NOTE — Consult Note (Signed)
PULMONARY / CRITICAL CARE MEDICINE   Name: Ronald Bell MRN: [id number] DOB: 12-24-1940    ADMISSION DATE:  07/30/2015 CONSULTATION DATE:  08/01/15  REFERRING MD:  Kyra Searles  CHIEF COMPLAINT:  Dyspnea  HISTORY OF PRESENT ILLNESS:   Ronald Bell is a 75 y.o. male with multiple, significant medical problems not limited to hypertension, peripheral vascular disease , coronary artery disease s/p stent placement , severe aortic stenosis status post TAVR, and COPD gold IV (FEV1 37% in Dec 2016). He is a patient for Dr. [name].  He has multiple recent admissions for COPD exacerbation. Flu infection and PEs have been ruled out in the past. His last admissions were from 4/29-5/1 and 5/2-07/27/15.  Now readmitted on 5/9 with acute exacerbation of COPD, acute episode of dyspnea. He is on Pulmicort, Duonebs, Solu-Medrol with improvement in symptoms. As an outpatient he had been on [place], anoro and bevespi.   PAST MEDICAL HISTORY :  He  has a past medical history of Polyp of nasal cavity; Rosacea; adenomatous colonic polyps (2012, 2013. ); Tobacco abuse; Hypertension; Hyperlipidemia; GERD (gastroesophageal reflux disease); GI bleed (2010); Peripheral vascular disease ([place]); Irregular heartbeat; Shingles; Carotid artery occlusion; Angiodysplasia of intestine with hemorrhage; Pleural plaque with presence of asbestos (03/27/2013); GERD (11/30/2008); PVD (peripheral vascular disease) ([place]) (10/18/2012); Carotid artery stenosis (04/22/2012); Coronary artery disease; COPD GOLD III with min reversibilty  (08/11/2006); Iron deficiency anemia (01/25/2009); History of blood transfusion ("couple times"); Arthritis; Anxiety; 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  PAST SURGICAL HISTORY: He  has past surgical history that includes Iliac artery stent (Left, 2005); Esophagogastroduodenoscopy (2012); Colonoscopy (July 2015); Tonsillectomy; Knee arthroscopy with medial  menisectomy (Left, 03/08/2014); left and right heart catheterization with coronary angiogram (N/A, 06/28/2014); Esophagogastroduodenoscopy (N/A, 08/17/2014); Colonoscopy (N/A, 08/17/2014); Cardiac catheterization (2001; 06/28/2014); Cardiac catheterization (N/A, 10/19/2014); Transcatheter aortic valve replacement, transapical (N/A, 11/20/2014); TEE without cardioversion (N/A, 11/20/2014); Rib plating (Left, 11/20/2014); Colonoscopy (N/A, 12/05/2014); and enteroscopy (N/A, 12/05/2014).  No Known Allergies  No current facility-administered medications on file prior to encounter.   Current Outpatient Prescriptions on File Prior to Encounter  Medication Sig  . albuterol (PROVENTIL HFA;VENTOLIN HFA) 108 (90 Base) MCG/ACT inhaler Inhale 2 puffs into the lungs every 4 (four) hours as needed for wheezing or shortness of breath.  Marland Kitchen albuterol (PROVENTIL) (2.5 MG/3ML) 0.083% nebulizer solution Take 3 mLs (2.5 mg total) by nebulization every 4 (four) hours as needed for wheezing or shortness of breath.  Marland Kitchen arformoterol (BROVANA) 15 MCG/2ML NEBU Take 2 mLs (15 mcg total) by nebulization 2 (two) times daily.  . Ascorbic Acid (VITAMIN C) 250 MG tablet Take 500 mg by mouth daily.   Marland Kitchen aspirin 81 MG tablet Take 81 mg by mouth daily.  Marland Kitchen atorvastatin (LIPITOR) 40 MG tablet TAKE ONE TABLET BY MOUTH ONCE DAILY  . budesonide (PULMICORT) 0.25 MG/2ML nebulizer solution Inhale 2 mLs (0.25 mg total) into the lungs 2 (two) times daily.  . citalopram (CELEXA) 10 MG tablet Take 1 tablet (10 mg total) by mouth daily.  . diclofenac sodium (VOLTAREN) 1 % GEL Apply 2 g topically 4 (four) times daily. (Patient taking differently: Apply 2 g topically 4 (four) times daily as needed (for pain). )  . docusate sodium (COLACE) 100 MG capsule Take 100 mg by mouth 2 (two) times daily.   . famotidine (PEPCID) 20 MG tablet Take 20 mg by mouth at bedtime.   . fenofibrate 160 MG tablet Take 1 tablet (160 mg  total) by mouth daily.  . ferrous sulfate 325  (65 FE) MG EC tablet Take 1 tablet (325 mg total) by mouth 2 (two) times daily.  Marland Kitchen ipratropium-albuterol (DUONEB) 0.5-2.5 (3) MG/3ML SOLN Take 3 mLs by nebulization every 4 (four) hours as needed. Take Nebs three times a day for this week, after that continue as needed for shortness of breath or wheezing  . metoprolol tartrate (LOPRESSOR) 25 MG tablet Take 0.5 tablets (12.5 mg total) by mouth 2 (two) times daily.  . Multiple Vitamins-Minerals (CENTRUM SILVER PO) Take 1 tablet by mouth daily.   . OXYGEN Inhale 2 L into the lungs at bedtime.   . pantoprazole (PROTONIX) 40 MG tablet Take 1 tablet (40 mg total) by mouth daily.  . potassium chloride SA (K-DUR,KLOR-CON) 20 MEQ tablet Take 1 tablet (20 mEq total) by mouth daily.  . temazepam (RESTORIL) 30 MG capsule TAKE ONE CAPSULE BY MOUTH AT BEDTIME AS NEEDED FOR SLEEP (Patient taking differently: TAKE ONE CAPSULE BY MOUTH AT BEDTIME FOR SLEEP)  . ALPRAZolam (XANAX) 0.5 MG tablet TAKE ONE TABLET BY MOUTH THREE TIMES DAILY AS NEEDED FOR ANXIETY (Patient not taking: Reported on 07/23/2015)  . doxycycline (VIBRA-TABS) 100 MG tablet Take 1 tablet (100 mg total) by mouth 2 (two) times daily. X 7days (Patient not taking: Reported on 07/30/2015)  . furosemide (LASIX) 20 MG tablet Take 1 tablet (20 mg total) by mouth daily. (Patient not taking: Reported on 07/23/2015)  . predniSONE (DELTASONE) 10 MG tablet Prednisone dosing: Take  Prednisone 40mg  (4 tabs) x 2 days, then taper to 30mg  (3 tabs) x 3 days, then 20mg  (2 tabs) x 3days, then 10mg  (1 tab) x 3days, then OFF.  Dispense:  26 tabs, refills: None (Patient not taking: Reported on 07/23/2015)    FAMILY HISTORY:  His indicated that his mother is deceased. He indicated that his father is deceased. He indicated that his sister is alive. He indicated that his brother is alive. He indicated that his daughter is deceased. He indicated that his son is alive.   SOCIAL HISTORY: He  reports that he quit smoking about 3  months ago. His smoking use included Cigarettes and E-cigarettes. He has a 60 pack-year smoking history. He has never used smokeless tobacco. He reports that he drinks about 8.4 oz of alcohol per week. He reports that he does not use illicit drugs.  REVIEW OF SYSTEMS:   Dyspnea on exertion, cough, wheezing, sputum production. No hemoptysis. No nausea, vomiting, diarrhea, constipation. No chest pain, palpitation. No fevers, chills. All other review of systems are negative.  SUBJECTIVE:   VITAL SIGNS: BP 119/67 mmHg  Pulse 73  Temp(Src) 97.6 F (36.4 C) (Oral)  Resp 20  Ht 5\' 2"  (1.575 m)  Wt 133 lb 6.4 oz (60.51 kg)  BMI 24.39 kg/m2  SpO2 96%  HEMODYNAMICS:   VENTILATOR SETTINGS:   INTAKE / OUTPUT: I/O last 3 completed shifts: In: 1615 [P.O.:1615] Out: 2325 [Urine:2325]  PHYSICAL EXAMINATION: General:  Elderly man, no acute distress Neuro:  No gross focal deficits HEENT:  Moist mucous membranes, no thyromegaly, JVD Cardiovascular:  Regular rate and rhythm, no murmurs rubs gallops Lungs:  Diminished air entry, no wheeze, crackles Abdomen:  Soft, positive bowel sounds Musculoskeletal:  Normal tone and bulk, no edema Skin:  Intact  LABS:  BMET  Recent Labs Lab 07/26/15 0231 07/30/15 0806 07/31/15 0335  NA 142 141 141  K 3.8 4.9 4.2  CL 102 105 106  CO2 31 25  25  BUN 20 28* 24*  CREATININE 1.01 0.95 0.98  GLUCOSE 102* 64* 148*    Electrolytes  Recent Labs Lab 07/26/15 0231 07/30/15 0806 07/31/15 0335  CALCIUM 9.5 9.4 9.4    CBC  Recent Labs Lab 07/30/15 0806 07/31/15 0335  WBC 11.9* 11.1*  HGB 12.0* 11.2*  HCT 36.8* 34.0*  PLT 279 262    Coag's No results for input(s): APTT, INR in the last 168 hours.  Sepsis Markers No results for input(s): LATICACIDVEN, PROCALCITON, O2SATVEN in the last 168 hours.  ABG No results for input(s): PHART, PCO2ART, PO2ART in the last 168 hours.  Liver Enzymes No results for input(s): AST, ALT, ALKPHOS,  BILITOT, ALBUMIN in the last 168 hours.  Cardiac Enzymes No results for input(s): TROPONINI, PROBNP in the last 168 hours.  Glucose  Recent Labs Lab 08/01/15 1114  GLUCAP 195*    Imaging No results found.  STUDIES:  CTA 07/23/15 > no evidence of significant PE, diffuse emphysema, fibrosis, pleural plaques. Images reviewed. CXR 07/30/15> hyperinflation, fibrosis.  CULTURES:  ANTIBIOTICS: None  SIGNIFICANT EVENTS: 5/9 > Admit with AECOPD  LINES/TUBES:  DISCUSSION: This 75 year old with severe COPD, multiple recent exacerbations. He appears to be steadily improving since his admission 2 days ago. He needs further optimization of his inhalers before discharge. Addition of daliresp may help reduce the frequency of recurrent exacerbations. He likely need to be discharged on a prolonged slow taper of prednisone with a close follow-up in the pulmonary clinic.  Reccs: - Start Anoro epipta - Continue pulmocort nebs bid - Duonebs to be used PRN.  - Start daliresp - Can reduce solumedrol to 60 mg q12. - Ambulatory O2 eval before discharge.   We will continue to follow.  Marshell [name] MD [facility] Pager [phone] If no answer or after 3pm call: [phone] 08/01/2015, 6:37 PM

## 2015-08-02 ENCOUNTER — Inpatient Hospital Stay: Payer: Medicare Other | Admitting: Physician Assistant

## 2015-08-02 ENCOUNTER — Inpatient Hospital Stay: Payer: Medicare Other | Admitting: Internal Medicine

## 2015-08-02 MED ORDER — BUDESONIDE 0.5 MG/2ML IN SUSP: 0.5000 mg | Freq: Two times a day (BID) | RESPIRATORY_TRACT | Status: DC

## 2015-08-02 MED ORDER — PREDNISONE 10 MG PO TABS: ORAL_TABLET | ORAL | Status: DC

## 2015-08-02 MED ORDER — UMECLIDINIUM BROMIDE 62.5 MCG/INH IN AEPB: 1.0000 | INHALATION_SPRAY | Freq: Every day | RESPIRATORY_TRACT | Status: DC

## 2015-08-02 MED ORDER — ROFLUMILAST 500 MCG PO TABS: 500.0000 ug | ORAL_TABLET | Freq: Every day | ORAL | Status: DC

## 2015-08-02 MED ORDER — LEVOFLOXACIN 750 MG PO TABS: 750.0000 mg | ORAL_TABLET | Freq: Every day | ORAL | Status: DC

## 2015-08-02 NOTE — Discharge Summary (Signed)
Physician Discharge Summary  Patient ID: Ronald Bell MRN: [id number] DOB/AGE: 77-May-1942 77 y.o.  Admit date: 07/30/2015 Discharge date: 08/02/2015  Admission Diagnoses:  Discharge Diagnoses:  Principal Problem:   Acute on chronic respiratory failure ([place]) Active Problems:   Iron deficiency anemia   Essential hypertension   COPD GOLD III with min reversibilty    GERD   COPD exacerbation ([facility])   Aortic stenosis, severe   CAD (coronary artery disease)   Pulmonary fibrosis ([facility])   Discharged Condition: stable  Hospital Course: 75 y.o. 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 Patient has had recurrent admissions in the last 6 weeks. Patient was managed with IV steroids and nebulizer treatment. During the hospital, the patient was seen by the Pulmonary team. Patient will be discharged back home on slow tapering of oral steroids, nebulizer treatment as directed by the Pulmonary team and oral antibiotics. Patient feels he is back to his baseline and is eager to be discharged back home.   Consults: Pulmonary  Discharge meds - Please see the Med. Rec.  Discharge Exam: Blood pressure 152/69, pulse 64, temperature 97.5 F (36.4 C), temperature source Oral, resp. rate 21, height 5\' 2"  (1.575 m), weight 61.326 kg (135 lb 3.2 oz), SpO2 99 %.   Disposition: 06-Home-Health Care Svc     Medication List    STOP taking these medications        budesonide 0.25 MG/2ML nebulizer solution  Commonly known as:  PULMICORT  Replaced by:  budesonide 0.5 MG/2ML nebulizer solution     diclofenac sodium 1 % Gel  Commonly known as:  VOLTAREN     doxycycline 100 MG tablet  Commonly known as:  VIBRA-TABS     furosemide 20 MG tablet  Commonly known as:  LASIX     potassium  chloride SA 20 MEQ tablet  Commonly known as:  K-DUR,KLOR-CON      TAKE these medications        albuterol 108 (90 Base) MCG/ACT inhaler  Commonly known as:  PROVENTIL HFA;VENTOLIN HFA  Inhale 2 puffs into the lungs every 4 (four) hours as needed for wheezing or shortness of breath.     albuterol (2.5 MG/3ML) 0.083% nebulizer solution  Commonly known as:  PROVENTIL  Take 3 mLs (2.5 mg total) by nebulization every 4 (four) hours as needed for wheezing or shortness of breath.     ALPRAZolam 0.5 MG tablet  Commonly known as:  XANAX  TAKE ONE TABLET BY MOUTH THREE TIMES DAILY AS NEEDED FOR ANXIETY     arformoterol 15 MCG/2ML Nebu  Commonly known as:  BROVANA  Take 2 mLs (15 mcg total) by nebulization 2 (two) times daily.     aspirin 81 MG tablet  Take 81 mg by mouth daily.     atorvastatin 40 MG tablet  Commonly known as:  LIPITOR  TAKE ONE TABLET BY MOUTH ONCE DAILY     budesonide 0.5 MG/2ML nebulizer solution  Commonly known as:  PULMICORT  Take 2 mLs (0.5 mg total) by nebulization 2 (two) times daily.     CENTRUM SILVER PO  Take 1 tablet by mouth daily.     citalopram 10 MG tablet  Commonly known as:  CELEXA  Take 1 tablet (10 mg total) by mouth daily.  docusate sodium 100 MG capsule  Commonly known as:  COLACE  Take 100 mg by mouth 2 (two) times daily.     famotidine 20 MG tablet  Commonly known as:  PEPCID  Take 20 mg by mouth at bedtime.     fenofibrate 160 MG tablet  Take 1 tablet (160 mg total) by mouth daily.     ferrous sulfate 325 (65 FE) MG EC tablet  Take 1 tablet (325 mg total) by mouth 2 (two) times daily.     ipratropium-albuterol 0.5-2.5 (3) MG/3ML Soln  Commonly known as:  DUONEB  Take 3 mLs by nebulization every 4 (four) hours as needed. Take Nebs three times a day for this week, after that continue as needed for shortness of breath or wheezing     metoprolol tartrate 25 MG tablet  Commonly known as:  LOPRESSOR  Take 0.5 tablets (12.5 mg  total) by mouth 2 (two) times daily.     OXYGEN  Inhale 2 L into the lungs at bedtime.     pantoprazole 40 MG tablet  Commonly known as:  PROTONIX  Take 1 tablet (40 mg total) by mouth daily.     predniSONE 10 MG tablet  Commonly known as:  DELTASONE  Prednisone 60mg  po daily for 4 days, then 50mg  po daily for 4 days, then 40mg  po daily for 4 days, then 30mg  po daily for 4 days, then 20mg  po daily for 4 days and then 10mg  po daily for 4 days.     roflumilast 500 MCG Tabs tablet  Commonly known as:  DALIRESP  Take 1 tablet (500 mcg total) by mouth daily.     temazepam 30 MG capsule  Commonly known as:  RESTORIL  TAKE ONE CAPSULE BY MOUTH AT BEDTIME AS NEEDED FOR SLEEP     umeclidinium bromide 62.5 MCG/INH Aepb  Commonly known as:  INCRUSE ELLIPTA  Inhale 1 puff into the lungs daily.     vitamin C 250 MG tablet  Commonly known as:  ASCORBIC ACID  Take 500 mg by mouth daily.           Follow-up Information    Follow up with Christinia Gully, MD On 08/23/2015.   Specialty:  Pulmonary Disease   Why:  Appt at 2:00 PM   Contact information:   [address] [place] [address] [phone]       Follow up with Ann Held, DO In 1 week.   Specialty:  Family Medicine   Why:  Follow up with the Pulmonologist   Contact information:   [address] [place] [address] [phone]       Signed: Bonnell Public 08/02/2015, 10:56 AM

## 2015-08-02 NOTE — Progress Notes (Signed)
PULMONARY / CRITICAL CARE MEDICINE   Name: Ronald Bell MRN: [id number] DOB: 01-29-1941    ADMISSION DATE:  07/30/2015 CONSULTATION DATE:  08/01/15  REFERRING MD:  Kyra Searles  CHIEF COMPLAINT:  Dyspnea   SUBJECTIVE:  Pt reports feeling much better.  Hopeful to go home.    VITAL SIGNS: BP 152/69 mmHg  Pulse 64  Temp(Src) 97.5 F (36.4 C) (Oral)  Resp 21  Ht 5\' 2"  (1.575 m)  Wt 135 lb 3.2 oz (61.326 kg)  BMI 24.72 kg/m2  SpO2 99%  INTAKE / OUTPUT: I/O last 3 completed shifts: In: 2690 [P.O.:2690] Out: 2025 [Urine:2025]  PHYSICAL EXAMINATION: General:  Elderly man, no acute distress, standing at sink  Neuro:  No gross focal deficits HEENT:  Moist mucous membranes, no thyromegaly, JVD Cardiovascular:  Regular rate and rhythm, no murmurs rubs gallops Lungs:  Diminished air entry, no wheeze, crackles Abdomen:  Soft, positive bowel sounds Musculoskeletal:  Normal tone and bulk, no edema Skin:  Intact  LABS:  BMET  Recent Labs Lab 07/30/15 0806 07/31/15 0335  NA 141 141  K 4.9 4.2  CL 105 106  CO2 25 25  BUN 28* 24*  CREATININE 0.95 0.98  GLUCOSE 64* 148*    Electrolytes  Recent Labs Lab 07/30/15 0806 07/31/15 0335  CALCIUM 9.4 9.4    CBC  Recent Labs Lab 07/30/15 0806 07/31/15 0335  WBC 11.9* 11.1*  HGB 12.0* 11.2*  HCT 36.8* 34.0*  PLT 279 262    Glucose  Recent Labs Lab 08/01/15 1114  GLUCAP 195*    Imaging No results found.  STUDIES:  CTA 07/23/15 > no evidence of significant PE, diffuse emphysema, fibrosis, pleural plaques. Images reviewed. CXR 07/30/15> hyperinflation, fibrosis.  CULTURES:  ANTIBIOTICS: None  SIGNIFICANT EVENTS: 5/9 > Admit with AECOPD  LINES/TUBES:  DISCUSSION: 75 year old M with severe COPD, multiple recent exacerbations. He appears to be steadily improving since his admission 2 days ago. He needs further optimization of his inhalers before discharge. Addition of daliresp may help reduce the  frequency of recurrent exacerbations. He likely need to be discharged on a prolonged slow taper of prednisone with a close follow-up in the pulmonary clinic.  Reccs: - Continue Anoro ellipta - Continue pulmocort nebs bid, adjust dosing to 0.5 mg BID - Duonebs PRN - Continue daliresp - Ambulatory O2 eval before discharge - Continue solumedrol to 60 mg q12 with slow taper.  Upon transition for discharge, would start prednisone taper at 60 mg with gradual reduction by 10mg  Q4 days - Follow up with Pulmonary within 2 weeks post discharge > appt arranged, see discharge section.   PCCM will sign off.  Please call if new needs arise.   Noe Gens, NP-C [facility] Pgr: [phone] or if no answer [phone] 08/02/2015, 9:01 AM   Attending note: I have seen and examined the patient with nurse practitioner/resident and agree with the note. History, labs and imaging reviewed.  75 Y/O with severe COPD, multiple exacerbations. Feeling better today, no wheezing Likely D/C today. Continue Anoro elipta, pulmocort nebs Duo nebs as needed Started on daliresp. Slow pred taper till he is seen in pulmonary clinic again.  Marshell [name] MD [facility] Pager [phone] If no answer or after 3pm call: [phone] 08/02/2015, 11:31 AM

## 2015-08-02 NOTE — Telephone Encounter (Signed)
Discussed by phone, pt happy with care, f/u arranged here

## 2015-08-05 ENCOUNTER — Telehealth: Payer: Self-pay | Admitting: Behavioral Health

## 2015-08-05 NOTE — Telephone Encounter (Signed)
Transition Care Management Follow-up Telephone Call  Admit date: 07/30/2015 Discharge date: 08/02/2015  Discharge Diagnoses:  Principal Problem:  Acute on chronic respiratory failure ([place]) Active Problems:  Iron deficiency anemia  Essential hypertension  COPD GOLD III with min reversibilty   GERD  COPD exacerbation ([place])  Aortic stenosis, severe  CAD (coronary artery disease)  Pulmonary fibrosis ([place])  Discharged Condition: stable   How have you been since you were released from the hospital? Patient stated, "I'm doing everything that I'm suppose to do, like my breathing exercises right now".   Do you understand why you were in the hospital? yes   Do you understand the discharge instructions? yes   Where were you discharged to? Home with spouse.   Items Reviewed:  Medications reviewed: no; patient voiced that changes were made in the hospital, but instead of discussing them over the phone he would like to bring a list of them in person.  Allergies reviewed: yes, NKA  Dietary changes reviewed: per the patient, no changes in diet.  Referrals reviewed: yes, follow-up with PCP & Dr. [name], Pulmonologist.   Functional Questionnaire:   Activities of Daily Living (ADLs):   He states they are independent in the following: ambulation, bathing and hygiene, feeding, continence, grooming, toileting and dressing States they require assistance with the following: None   Any transportation issues/concerns?: no   Any patient concerns? no   Confirmed importance and date/time of follow-up visits scheduled yes, 08/13/15 at 11:30 AM.  Provider Appointment booked with Dr. Carollee Herter.  Confirmed with patient if condition begins to worsen call PCP or go to the ER.  Patient was given the office number and encouraged to call back with question or concerns.  : yes

## 2015-08-07 ENCOUNTER — Telehealth: Payer: Self-pay | Admitting: Family Medicine

## 2015-08-07 NOTE — Telephone Encounter (Signed)
Ok to give verbal for [facility] to evaluate

## 2015-08-07 NOTE — Telephone Encounter (Signed)
Caller name: Ronald Bell with [facility] Can be reached: [phone]   Reason for call: OK to talk to Ronald Bell or Erasmo Downer  Requesting VO for [facility] services. Pt was in the hospital, the hospital had referred pt for [facility], but then pt was readmitted.  They did not receive orders for [facility] at dc but the family is requesting.

## 2015-08-08 NOTE — Telephone Encounter (Signed)
Called [place] with [facility]. Left message per Dr. Etter Sjogren that it will be ok for Home Health Evaluation. Left message for call back if necessary.

## 2015-08-12 ENCOUNTER — Other Ambulatory Visit: Payer: Self-pay | Admitting: *Deleted

## 2015-08-12 NOTE — Patient Outreach (Signed)
[facility] Care Management  08/12/2015  Ronald Bell 1941/01/17 [id number]  [facility] involved via last hospital discharge and has been discharged via [facility] contacts and services.  Raina Mina, RN Care Management Coordinator [place] Office [phone]

## 2015-08-13 ENCOUNTER — Ambulatory Visit (INDEPENDENT_AMBULATORY_CARE_PROVIDER_SITE_OTHER): Payer: Medicare Other | Admitting: Family Medicine

## 2015-08-13 ENCOUNTER — Encounter: Payer: Self-pay | Admitting: Family Medicine

## 2015-08-13 VITALS — BP 128/58 | HR 94 | Temp 97.6°F | Ht 62.0 in | Wt 132.8 lb

## 2015-08-13 DIAGNOSIS — E785 Hyperlipidemia, unspecified: Secondary | ICD-10-CM | POA: Diagnosis not present

## 2015-08-13 DIAGNOSIS — D500 Iron deficiency anemia secondary to blood loss (chronic): Secondary | ICD-10-CM

## 2015-08-13 DIAGNOSIS — I2510 Atherosclerotic heart disease of native coronary artery without angina pectoris: Secondary | ICD-10-CM

## 2015-08-13 DIAGNOSIS — E781 Pure hyperglyceridemia: Secondary | ICD-10-CM | POA: Diagnosis not present

## 2015-08-13 DIAGNOSIS — J441 Chronic obstructive pulmonary disease with (acute) exacerbation: Secondary | ICD-10-CM

## 2015-08-13 DIAGNOSIS — I10 Essential (primary) hypertension: Secondary | ICD-10-CM

## 2015-08-13 MED ORDER — BUDESONIDE 0.5 MG/2ML IN SUSP: 0.5000 mg | Freq: Two times a day (BID) | RESPIRATORY_TRACT | Status: DC

## 2015-08-13 NOTE — Patient Instructions (Signed)
Chronic Obstructive Pulmonary Disease Chronic obstructive pulmonary disease (COPD) is a common lung condition in which airflow from the lungs is limited. COPD is a general term that can be used to describe many different lung problems that limit airflow, including both chronic bronchitis and emphysema. If you have COPD, your lung function will probably never return to normal, but there are measures you can take to improve lung function and make yourself feel better. CAUSES   Smoking (common).  Exposure to secondhand smoke.  Genetic problems.  Chronic inflammatory lung diseases or recurrent infections. SYMPTOMS  Shortness of breath, especially with physical activity.  Deep, persistent (chronic) cough with a large amount of thick mucus.  Wheezing.  Rapid breaths (tachypnea).  Gray or bluish discoloration (cyanosis) of the skin, especially in your fingers, toes, or lips.  Fatigue.  Weight loss.  Frequent infections or episodes when breathing symptoms become much worse (exacerbations).  Chest tightness. DIAGNOSIS Your health care provider will take a medical history and perform a physical examination to diagnose COPD. Additional tests for COPD may include:  Lung (pulmonary) function tests.  Chest X-ray.  CT scan.  Blood tests. TREATMENT  Treatment for COPD may include:  Inhaler and nebulizer medicines. These help manage the symptoms of COPD and make your breathing more comfortable.  Supplemental oxygen. Supplemental oxygen is only helpful if you have a low oxygen level in your blood.  Exercise and physical activity. These are beneficial for nearly all people with COPD.  Lung surgery or transplant.  Nutrition therapy to gain weight, if you are underweight.  Pulmonary rehabilitation. This may involve working with a team of health care providers and specialists, such as respiratory, occupational, and physical therapists. HOME CARE INSTRUCTIONS  Take all medicines  (inhaled or pills) as directed by your health care provider.  Avoid over-the-counter medicines or cough syrups that dry up your airway (such as antihistamines) and slow down the elimination of secretions unless instructed otherwise by your health care provider.  If you are a smoker, the most important thing that you can do is stop smoking. Continuing to smoke will cause further lung damage and breathing trouble. Ask your health care provider for help with quitting smoking. He or she can direct you to community resources or hospitals that provide support.  Avoid exposure to irritants such as smoke, chemicals, and fumes that aggravate your breathing.  Use oxygen therapy and pulmonary rehabilitation if directed by your health care provider. If you require home oxygen therapy, ask your health care provider whether you should purchase a pulse oximeter to measure your oxygen level at home.  Avoid contact with individuals who have a contagious illness.  Avoid extreme temperature and humidity changes.  Eat healthy foods. Eating smaller, more frequent meals and resting before meals may help you maintain your strength.  Stay active, but balance activity with periods of rest. Exercise and physical activity will help you maintain your ability to do things you want to do.  Preventing infection and hospitalization is very important when you have COPD. Make sure to receive all the vaccines your health care provider recommends, especially the pneumococcal and influenza vaccines. Ask your health care provider whether you need a pneumonia vaccine.  Learn and use relaxation techniques to manage stress.  Learn and use controlled breathing techniques as directed by your health care provider. Controlled breathing techniques include:  Pursed lip breathing. Start by breathing in (inhaling) through your nose for 1 second. Then, purse your lips as if you were   going to whistle and breathe out (exhale) through the  pursed lips for 2 seconds.  Diaphragmatic breathing. Start by putting one hand on your abdomen just above your waist. Inhale slowly through your nose. The hand on your abdomen should move out. Then purse your lips and exhale slowly. You should be able to feel the hand on your abdomen moving in as you exhale.  Learn and use controlled coughing to clear mucus from your lungs. Controlled coughing is a series of short, progressive coughs. The steps of controlled coughing are: 1. Lean your head slightly forward. 2. Breathe in deeply using diaphragmatic breathing. 3. Try to hold your breath for 3 seconds. 4. Keep your mouth slightly open while coughing twice. 5. Spit any mucus out into a tissue. 6. Rest and repeat the steps once or twice as needed. SEEK MEDICAL CARE IF:  You are coughing up more mucus than usual.  There is a change in the color or thickness of your mucus.  Your breathing is more labored than usual.  Your breathing is faster than usual. SEEK IMMEDIATE MEDICAL CARE IF:  You have shortness of breath while you are resting.  You have shortness of breath that prevents you from:  Being able to talk.  Performing your usual physical activities.  You have chest pain lasting longer than 5 minutes.  Your skin color is more cyanotic than usual.  You measure low oxygen saturations for longer than 5 minutes with a pulse oximeter. MAKE SURE YOU:  Understand these instructions.  Will watch your condition.  Will get help right away if you are not doing well or get worse.   This information is not intended to replace advice given to you by your health care provider. Make sure you discuss any questions you have with your health care provider.   Document Released: 12/17/2004 Document Revised: 03/30/2014 Document Reviewed: 11/03/2012 Elsevier Interactive Patient Education 2016 [employer]  

## 2015-08-13 NOTE — Progress Notes (Signed)
Patient ID: Ronald Bell, male    DOB: 1940/11/04  Age: 75 y.o. MRN: [id number]    Subjective:  Subjective HPI ELIOT MALBURG presents for hospital f/u-- he was in the hospital 3 x in last month.  Last time he was d/c on 5/12.-- copd  Review of Systems  Constitutional: Negative for diaphoresis, appetite change, fatigue and unexpected weight change.  Eyes: Negative for pain, redness and visual disturbance.  Respiratory: Negative for cough, chest tightness, shortness of breath and wheezing.   Cardiovascular: Negative for chest pain, palpitations and leg swelling.  Endocrine: Negative for cold intolerance, heat intolerance, polydipsia, polyphagia and polyuria.  Genitourinary: Negative for dysuria, frequency and difficulty urinating.  Neurological: Negative for dizziness, light-headedness, numbness and headaches.    History Past Medical History  Diagnosis Date  . Polyp of nasal cavity   . Rosacea   . Hx of adenomatous colonic polyps 2012, 2013.   . Tobacco abuse   . Hypertension   . Hyperlipidemia   . GERD (gastroesophageal reflux disease)   . GI bleed 2010    4 units PRBCs  . Peripheral vascular disease ([place])   . Irregular heartbeat   . Shingles   . Carotid artery occlusion   . Angiodysplasia of intestine with hemorrhage     large and SB, gastric AVMs.   . Pleural plaque with presence of asbestos 03/27/2013    Followed in Pulmonary clinic/ [facility]/ [place] - F/u CT 09/08/2013 1. Stable extensive calcified pleural plaque formation consistent with asbestos exposure. 2. Multiple pulmonary nodules are unchanged from the CT of 6 months ago. Given risk factors for lung cancer, continued follow up is recommended with chest CT in 6 months> done 04/20/14 no change >repeat in 12 m in tickle file     . GERD 11/30/2008    Qualifier: Diagnosis of  By: Marijean Niemann CMA, Danielle    . PVD (peripheral vascular disease) ([place]) 10/18/2012  . Carotid artery stenosis 04/22/2012  . Coronary  artery disease   . COPD GOLD III with min reversibilty  08/11/2006    Followed in Pulmonary clinic/ [facility]/ [place] - PFT's 04/28/2013  FEV1 0.88 (40%) with ratio 44 and 14% better p B2 dlco 45 corrects to 83 - Trial of breo 04/28/2013 > improved symptoms  06/09/2013  - spirometry 06/04/2014 FEV1  0.76 (29%) ratio 45      . Iron deficiency anemia 01/25/2009    Qualifier: Diagnosis of  By: Henrene Pastor MD, Docia Chuck   . History of blood transfusion "couple times"    "related to bleeding in colon and esophagus"  . Arthritis     "left shoulder" (10/19/2014)  . Anxiety   . 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 On home oxygen therapy     "2L at night" (07/23/2015)    He has past surgical history that includes Iliac artery stent (Left, 2005); Esophagogastroduodenoscopy (2012); Colonoscopy (July 2015); Tonsillectomy; Knee arthroscopy with medial menisectomy (Left, 03/08/2014); left and right heart catheterization with coronary angiogram (N/A, 06/28/2014); Esophagogastroduodenoscopy (N/A, 08/17/2014); Colonoscopy (N/A, 08/17/2014); Cardiac catheterization (2001; 06/28/2014); Cardiac catheterization (N/A, 10/19/2014); Transcatheter aortic valve replacement, transapical (N/A, 11/20/2014); TEE without cardioversion (N/A, 11/20/2014); Rib plating (Left, 11/20/2014); Colonoscopy (N/A, 12/05/2014); and enteroscopy (N/A, 12/05/2014).   His family history includes CAD in his daughter; Colitis in his father; Heart disease in his brother; Hyperlipidemia in his brother; Hypertension in his brother  and son; Lung disease in his mother. There is no history of Colon cancer.He reports that he quit smoking about 4 months ago. His smoking use included Cigarettes and E-cigarettes. He has a 60 pack-year smoking history. He has never used smokeless tobacco. He reports that he drinks about 8.4 oz of alcohol per week. He reports that he does  not use illicit drugs.  Current Outpatient Prescriptions on File Prior to Visit  Medication Sig Dispense Refill  . albuterol (PROVENTIL HFA;VENTOLIN HFA) 108 (90 Base) MCG/ACT inhaler Inhale 2 puffs into the lungs every 4 (four) hours as needed for wheezing or shortness of breath. 1 Inhaler 11  . albuterol (PROVENTIL) (2.5 MG/3ML) 0.083% nebulizer solution Take 3 mLs (2.5 mg total) by nebulization every 4 (four) hours as needed for wheezing or shortness of breath. 75 mL 12  . arformoterol (BROVANA) 15 MCG/2ML NEBU Take 2 mLs (15 mcg total) by nebulization 2 (two) times daily. 120 mL 3  . Ascorbic Acid (VITAMIN C) 250 MG tablet Take 500 mg by mouth daily.     Marland Kitchen aspirin 81 MG tablet Take 81 mg by mouth daily.    . citalopram (CELEXA) 10 MG tablet Take 1 tablet (10 mg total) by mouth daily. 30 tablet 3  . docusate sodium (COLACE) 100 MG capsule Take 100 mg by mouth 2 (two) times daily.     . famotidine (PEPCID) 20 MG tablet Take 20 mg by mouth at bedtime.     . ferrous sulfate 325 (65 FE) MG EC tablet Take 1 tablet (325 mg total) by mouth 2 (two) times daily. 60 tablet 11  . ipratropium-albuterol (DUONEB) 0.5-2.5 (3) MG/3ML SOLN Take 3 mLs by nebulization every 4 (four) hours as needed. Take Nebs three times a day for this week, after that continue as needed for shortness of breath or wheezing 360 mL 3  . Multiple Vitamins-Minerals (CENTRUM SILVER PO) Take 1 tablet by mouth daily.     . OXYGEN Inhale 2 L into the lungs at bedtime.     . pantoprazole (PROTONIX) 40 MG tablet Take 1 tablet (40 mg total) by mouth daily.    . predniSONE (DELTASONE) 10 MG tablet Prednisone 60mg  po daily for 4 days, then 50mg  po daily for 4 days, then 40mg  po daily for 4 days, then 30mg  po daily for 4 days, then 20mg  po daily for 4 days and then 10mg  po daily for 4 days. 84 tablet 0  . roflumilast (DALIRESP) 500 MCG TABS tablet Take 1 tablet (500 mcg total) by mouth daily. 30 tablet 1  . umeclidinium bromide (INCRUSE  ELLIPTA) 62.5 MCG/INH AEPB Inhale 1 puff into the lungs daily. 1 each 1   No current facility-administered medications on file prior to visit.     Objective:  Objective Physical Exam  Constitutional: He is oriented to person, place, and time. Vital signs are normal. He appears well-developed and well-nourished. He is sleeping.  HENT:  Head: Normocephalic and atraumatic.  Mouth/Throat: Oropharynx is clear and moist.  Eyes: EOM are normal. Pupils are equal, round, and reactive to light.  Neck: Normal range of motion. Neck supple. No thyromegaly present.  Cardiovascular: Normal rate and regular rhythm.   No murmur heard. Pulmonary/Chest: Effort normal and breath sounds normal. No respiratory distress. He has no wheezes. He has no rales. He exhibits no tenderness.  Musculoskeletal: He exhibits no edema or tenderness.  Neurological: He is alert and oriented to person, place, and time.  Skin: Skin is warm  and dry.  Psychiatric: He has a normal mood and affect. His behavior is normal. Judgment and thought content normal.  Nursing note and vitals reviewed.  BP 128/58 mmHg  Pulse 94  Temp(Src) 97.6 F (36.4 C) (Oral)  Ht 5\' 2"  (1.575 m)  Wt 132 lb 12.8 oz (60.238 kg)  BMI 24.28 kg/m2  SpO2 94% Wt Readings from Last 3 Encounters:  08/13/15 132 lb 12.8 oz (60.238 kg)  08/02/15 135 lb 3.2 oz (61.326 kg)  07/22/15 125 lb 7.1 oz (56.9 kg)     Lab Results  Component Value Date   WBC 11.1* 07/31/2015   HGB 11.2* 07/31/2015   HCT 34.0* 07/31/2015   PLT 262 07/31/2015   GLUCOSE 148* 07/31/2015   CHOL 212* 05/09/2015   TRIG 270.0* 05/09/2015   HDL 60.80 05/09/2015   LDLDIRECT 114.0 05/09/2015   LDLCALC 80 12/17/2014   ALT 38 05/19/2015   AST 27 05/19/2015   NA 141 07/31/2015   K 4.2 07/31/2015   CL 106 07/31/2015   CREATININE 0.98 07/31/2015   BUN 24* 07/31/2015   CO2 25 07/31/2015   TSH 1.206 06/01/2014   PSA 1.65 05/09/2015   INR 1.27 12/03/2014   HGBA1C 6.1* 11/16/2014    MICROALBUR 1.1 03/05/2014    Dg Chest Port 1 View  07/30/2015  CLINICAL DATA:  Shortness of Breath, transcatheter aortic valve replacement last year EXAM: PORTABLE CHEST 1 VIEW COMPARISON:  07/23/2015 FINDINGS: Cardiomediastinal silhouette is stable. Hyperinflation and fibrotic changes again noted bilaterally. Stable bilateral calcified pleural plaques. Again noted streaky atelectasis and fibrotic changes in lingula. No definite superimposed infiltrate. No pulmonary edema. Atherosclerotic calcifications of thoracic aorta. IMPRESSION: Hyperinflation and fibrotic changes again noted bilaterally. Stable bilateral calcified pleural plaques. Again noted streaky atelectasis and fibrotic changes in lingula. No definite superimposed infiltrate. No pulmonary edema. Electronically Signed   By: Lahoma Crocker M.D.   On: 07/30/2015 08:24     Assessment & Plan:  Plan I have discontinued Mr. Fleites levofloxacin. I have also changed his atorvastatin. Additionally, I am having him maintain his Multiple Vitamins-Minerals (CENTRUM SILVER PO), vitamin C, docusate sodium, citalopram, pantoprazole, ferrous sulfate, albuterol, aspirin, OXYGEN, famotidine, albuterol, arformoterol, ipratropium-albuterol, roflumilast, umeclidinium bromide, predniSONE, budesonide, and metoprolol tartrate.  Meds ordered this encounter  Medications  . budesonide (PULMICORT) 0.5 MG/2ML nebulizer solution    Sig: Take 2 mLs (0.5 mg total) by nebulization 2 (two) times daily.    Dispense:  240 mL    Refill:  12  . metoprolol tartrate (LOPRESSOR) 25 MG tablet    Sig: Take 0.5 tablets (12.5 mg total) by mouth 2 (two) times daily.    Dispense:  60 tablet    Refill:  6  . atorvastatin (LIPITOR) 40 MG tablet    Sig: Take 1 tablet (40 mg total) by mouth daily.    Dispense:  90 tablet    Refill:  1    Problem List Items Addressed This Visit    COPD exacerbation ([place]) - Primary   Relevant Medications   budesonide (PULMICORT) 0.5 MG/2ML  nebulizer solution   Essential hypertension (Chronic)   Relevant Medications   metoprolol tartrate (LOPRESSOR) 25 MG tablet   atorvastatin (LIPITOR) 40 MG tablet   Hyperlipidemia   Relevant Medications   metoprolol tartrate (LOPRESSOR) 25 MG tablet   atorvastatin (LIPITOR) 40 MG tablet   Other Relevant Orders   Lipid panel    Other Visit Diagnoses    Anemia due to chronic blood loss  Relevant Orders    Comprehensive metabolic panel    CBC with Differential/Platelet    Hypertriglyceridemia        Relevant Medications    metoprolol tartrate (LOPRESSOR) 25 MG tablet    atorvastatin (LIPITOR) 40 MG tablet    Other Relevant Orders    Lipid panel    Comprehensive metabolic panel       Follow-up: Return in about 3 months (around 11/13/2015), or if symptoms worsen or fail to improve, for annual exam, fasting.  Ann Held, DO

## 2015-08-15 ENCOUNTER — Other Ambulatory Visit: Payer: Self-pay | Admitting: Family Medicine

## 2015-08-15 NOTE — Telephone Encounter (Signed)
Pt is requesting refill on Alprazolam 0.5mg  tablet.  Last OV: 08/13/2015 Last Fill: 07/22/2015 #90 and 0RF Pt sig: 1 tablet TID PRN UDS: None  Please advise.

## 2015-08-15 NOTE — Telephone Encounter (Signed)
Rx will be faxed in the morning once signed by PCP.     KP

## 2015-08-15 NOTE — Telephone Encounter (Signed)
Refill x1 

## 2015-08-21 ENCOUNTER — Other Ambulatory Visit: Payer: Self-pay | Admitting: Family Medicine

## 2015-08-21 MED ORDER — ATORVASTATIN CALCIUM 40 MG PO TABS: 40.0000 mg | ORAL_TABLET | Freq: Every day | ORAL | Status: DC

## 2015-08-21 MED ORDER — METOPROLOL TARTRATE 25 MG PO TABS: 12.5000 mg | ORAL_TABLET | Freq: Two times a day (BID) | ORAL | Status: DC

## 2015-08-21 NOTE — Telephone Encounter (Signed)
Refill request sent for Temazepam.   Last ov: 08/13/15 Last refill: 07/16/2015  Ok to refill? Please advise.

## 2015-08-22 ENCOUNTER — Telehealth: Payer: Self-pay | Admitting: Family Medicine

## 2015-08-22 ENCOUNTER — Other Ambulatory Visit: Payer: Self-pay | Admitting: Family Medicine

## 2015-08-22 NOTE — Telephone Encounter (Signed)
Caller name: Tammy  Relation to pt: RN from [place]  Call back number: [phone]   Reason for call:  Requesting verbal orders for dr. Etter Sjogren to continue pallative care in home

## 2015-08-23 ENCOUNTER — Encounter: Payer: Self-pay | Admitting: Internal Medicine

## 2015-08-23 ENCOUNTER — Other Ambulatory Visit (INDEPENDENT_AMBULATORY_CARE_PROVIDER_SITE_OTHER): Payer: Medicare Other

## 2015-08-23 ENCOUNTER — Ambulatory Visit (INDEPENDENT_AMBULATORY_CARE_PROVIDER_SITE_OTHER): Payer: Medicare Other | Admitting: Internal Medicine

## 2015-08-23 VITALS — BP 130/68 | HR 79 | Ht 62.0 in | Wt 134.0 lb

## 2015-08-23 DIAGNOSIS — E785 Hyperlipidemia, unspecified: Secondary | ICD-10-CM | POA: Diagnosis not present

## 2015-08-23 DIAGNOSIS — J9611 Chronic respiratory failure with hypoxia: Secondary | ICD-10-CM

## 2015-08-23 DIAGNOSIS — E781 Pure hyperglyceridemia: Secondary | ICD-10-CM

## 2015-08-23 DIAGNOSIS — I2510 Atherosclerotic heart disease of native coronary artery without angina pectoris: Secondary | ICD-10-CM | POA: Diagnosis not present

## 2015-08-23 DIAGNOSIS — D500 Iron deficiency anemia secondary to blood loss (chronic): Secondary | ICD-10-CM | POA: Diagnosis not present

## 2015-08-23 DIAGNOSIS — J9612 Chronic respiratory failure with hypercapnia: Secondary | ICD-10-CM | POA: Diagnosis not present

## 2015-08-23 DIAGNOSIS — J449 Chronic obstructive pulmonary disease, unspecified: Secondary | ICD-10-CM | POA: Diagnosis not present

## 2015-08-23 DIAGNOSIS — D509 Iron deficiency anemia, unspecified: Secondary | ICD-10-CM | POA: Diagnosis not present

## 2015-08-23 LAB — CBC WITH DIFFERENTIAL/PLATELET
Basophils Absolute: 0 10*3/uL (ref 0.0–0.1)
Basophils Relative: 0.1 % (ref 0.0–3.0)
EOS PCT: 1.4 % (ref 0.0–5.0)
Eosinophils Absolute: 0.1 10*3/uL (ref 0.0–0.7)
HEMATOCRIT: 36 % — AB (ref 39.0–52.0)
HEMOGLOBIN: 11.9 g/dL — AB (ref 13.0–17.0)
LYMPHS PCT: 4 % — AB (ref 12.0–46.0)
Lymphs Abs: 0.4 10*3/uL — ABNORMAL LOW (ref 0.7–4.0)
MCHC: 33 g/dL (ref 30.0–36.0)
MCV: 96.5 fl (ref 78.0–100.0)
MONO ABS: 0.6 10*3/uL (ref 0.1–1.0)
Monocytes Relative: 5.3 % (ref 3.0–12.0)
Neutro Abs: 9.8 10*3/uL — ABNORMAL HIGH (ref 1.4–7.7)
Neutrophils Relative %: 89.2 % — ABNORMAL HIGH (ref 43.0–77.0)
Platelets: 224 10*3/uL (ref 150.0–400.0)
RBC: 3.73 Mil/uL — AB (ref 4.22–5.81)
RDW: 12.9 % (ref 11.5–15.5)
WBC: 11 10*3/uL — AB (ref 4.0–10.5)

## 2015-08-23 LAB — COMPREHENSIVE METABOLIC PANEL
ALBUMIN: 4.1 g/dL (ref 3.5–5.2)
ALT: 21 U/L (ref 0–53)
AST: 16 U/L (ref 0–37)
Alkaline Phosphatase: 38 U/L — ABNORMAL LOW (ref 39–117)
BILIRUBIN TOTAL: 0.4 mg/dL (ref 0.2–1.2)
BUN: 17 mg/dL (ref 6–23)
CALCIUM: 9.6 mg/dL (ref 8.4–10.5)
CO2: 26 mEq/L (ref 19–32)
CREATININE: 0.84 mg/dL (ref 0.40–1.50)
Chloride: 102 mEq/L (ref 96–112)
GFR: 94.17 mL/min (ref >60.00)
Glucose, Bld: 125 mg/dL — ABNORMAL HIGH (ref 70–99)
Potassium: 4.2 mEq/L (ref 3.5–5.1)
Sodium: 137 mEq/L (ref 135–145)
TOTAL PROTEIN: 6.6 g/dL (ref 6.0–8.3)

## 2015-08-23 LAB — LIPID PANEL
CHOLESTEROL: 171 mg/dL (ref 0–200)
HDL: 72.4 mg/dL (ref >39.00)
LDL CALC: 69 mg/dL (ref 0–99)
NonHDL: 98.48
TRIGLYCERIDES: 147 mg/dL (ref 0.0–149.0)
Total CHOL/HDL Ratio: 2
VLDL: 29.4 mg/dL (ref 0.0–40.0)

## 2015-08-23 MED ORDER — PREDNISONE 10 MG PO TABS
ORAL_TABLET | ORAL | Status: DC
Start: 2015-08-23 — End: 2015-12-03

## 2015-08-23 NOTE — Telephone Encounter (Signed)
Pt called stating he is out of temazepam.

## 2015-08-23 NOTE — Telephone Encounter (Signed)
Ok to give verbal 

## 2015-08-23 NOTE — Patient Instructions (Addendum)
Plan A = Automatic = Brovana/Budesonide twice daily and Incruse just in the am   Plan B = Backup Only use your albuterol as a rescue medication to be used if you can't catch your breath by resting or doing a relaxed purse lip breathing pattern.  - The less you use it, the better it will work when you need it. - Ok to use the inhaler up to 2 puffs  every 4 hours if you must but call for appointment if use goes up over your usual need - Don't leave home without it !!  (think of it like the spare tire for your car)   Plan C = Crisis - only use your ipatropium-albuterol nebulizer if you first try Plan B and it fails to help > ok to use the nebulizer up to every 4 hours but if start needing it regularly call for immediate appointment   Plan D = Doctor - call me if B and C not adequate  Plan E = ER - go to ER or call 911 if all else fails    Prednisone 10 mg x 2 each am with breakfast until back normal self then resume the 10 mg daily    See Tammy NP w/in 2 weeks(or first available)  with all your medications, even over the counter meds, separated in two separate bags, the ones you take no matter what vs the ones you stop once you feel better and take only as needed when you feel you need them.   Tammy  will generate for you a new user friendly medication calendar that will put [place] all on the same page re: your medication use.     Without this process, it simply isn't possible to assure that we are providing  your outpatient care  with  the attention to detail we feel you deserve.   If we cannot assure that you're getting that kind of care,  then we cannot manage your problem effectively from this clinic.  Once you have seen Tammy and we are sure that we're all on the same page with your medication use she will arrange follow up with me.

## 2015-08-23 NOTE — Progress Notes (Signed)
Subjective:    Patient ID: MAKAIO GUNNERSON, male    DOB: Feb 16, 1941  MRN: [id number]  Brief patient profile:  13 yowm  Quit smoking 03/2015  from [place] with variable cough starting Fall on Nov 2014 referred 03/27/2013 by Dr Etter Sjogren for abn CT c/w asbestosis with GOLD III copd with min  reversibility by pfts 04/28/13 and ? acei cough   History of Present Illness  03/27/2013 1st [facility] office visit/ [name] still active smoker cc recurrent episodes of severe refractory cough this episode started x 3 weeks prior to OV   but comes and goes since early fall 2014 assoc with doe x  walking garbage to street x 3 years better up to several hours after combivent on tudorza bid miant as well.  Cough is variably prod of thick yellow mucus but min amt, mostly prod in am and dry rest of the day.  Has noct choking spells just about every night p lies down better if sits up and takes combivent - they feel almost  like something strangling him.  rec Stop lisinopril Benicar 20/12.5 one daily  Pantoprazole (protonix) 40 mg   Take 30-60 min before first meal of the day and Pepcid ac 20 mg one bedtime until return to office   The key is to stop smoking completely before smoking completely stops you!    04/04/2013  Acute  ov/Kona Yusuf re: refractory cough  Chief Complaint  Patient presents with  . Acute Visit    Pt c/o increased cough since last visit. Cough is non prod and worse at night and early am. He states that he gets SOB with coughing spells and is using combivent 5 times daily. He states had episode of syncope last night and EMS was called.    really not sob unless coughing. No excess mucus, present 24/7  rec Stop tudorza Prednisone 10 mg take  4 each am x 2 days,   2 each am x 2 days,  1 each am x 2 days and stop  mucinex dm 1200 twice daily as long as you feel the urge to cough and supplement it with Tramadol 50 up to 2 to every 4 hours- once the cough is gone back off of it. Prednisone 10 mg take  4 each  am x 2 days,   2 each am x 2 days,  1 each am x 2 days and stop  Pantoprazole (protonix) 40 mg   Take 30-60 min before first meal of the day and Pepcid 20 mg one bedtime until return to office - this is the best way to tell whether stomach acid is contributing to your problem.   GERD diet   04/28/2013 f/u ov/Kyaira Trantham re: GOLD III COPD / cough much better off ACEi Chief Complaint  Patient presents with  . Follow-up    Pt states breathing is slightly improved and cough is much improved. He is using combivent approx 4 times per day.   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 160 - 12.5 one daily  Ok to stop pepcid to see what effect this has on your cough and if worse restart, if no change in symptoms x sev weeks then  stop protonix too to see if really need it Start breo open it once  each am (x 2 drags to be sure you get  it all) Only use your combivent as needed       Admit date: 04/16/2014 Discharge date: 04/17/2014   Discharge Diagnoses:    COPD exacerbation   Acute respiratory failure  Hyperlipidemia  Essential hypertension   06/04/2014 f/u ov/Destry Bezdek re: GOLD III / severe AS/ stopped smoking around 04/23/14  Chief Complaint  Patient presents with  . Acute Visit    Pt c/o increased SOB off and on since Feb 2016. He states that he wakes up in the night feeling out of breath and gets SOB walking from room to room at home. He is using combivent 5-6 x per day.    sleeping propped up now using noct combivent as well as q am breo   Cancelled ov with Dr [name] today "I wanted to get my breathing right first" rec Prednisone 10 mg take  4 each am x 2 days,   2 each am x 2 days,  1 each am x 2 days and stop  Add pepcid 20 mg at bedtime (over the counter) Prop up as much as possible at bedtime  No change Breo each am combivent up to 1 puff every 6 hours as needed  Keep appt with T  surgery    Last T surgery note  12/24/14  Patient returns for routine follow-up up approximately one month status post transcatheter aortic valve replacement using a 26 mm Edwards Sapien XT transcatheter heart valve placed via trans-apical approach on 11/20/2014. His early postoperative recovery in the hospital was notable for paroxysmal atrial fibrillation for which he was treated with amiodarone. He was discharged from the hospital on the seventh postoperative day in sinus rhythm. Because of his history of recurrent GI bleeding in the past he was not anticoagulated using warfarin. Dual antiplatelets therapy using aspirin and Plavix was resumed because of recent PCI and stenting of the right coronary artery. He was readmitted to the hospital one week later with melena and symptomatic anemia with hemoglobin 5.1. He was transfused a total of 5 units packed red blood cells and underwent both upper and lower endoscopy with ablation of multiple AVMs. During his hospitalization he also underwent left thoracentesis for a moderate sized left pleural effusion. He was eventually discharged home and he returns to our office today for routine follow-up. He was seen in follow-up earlier today by Dr. Henrene Pastor. He has not had any further signs of GI bleeding and his hemoglobin has remained stable 11.5. The patient states that he is still limited by exertional shortness of breath that is multifactorial and related to his underlying COPD and chronic diastolic congestive heart failure. He has not had any chest pain or chest tightness. Appetite is good. Home health physical therapy has been working with him and he has been making slow but steady progress. He does not wish to participate in outpatient cardiac rehabilitation program.    01/10/2015  Ext f/u  ov/Ashtyn Meland re:  Re-establish care post op COPD GOLD III on Breo and prn combivent  Chief Complaint  Patient presents with  . Follow-up    COPD f/u pt. states breathing has  wrosen. SOB with activity. no wheezing. no cough. no chest pain/tightness. on 3L O2 pulse.   Can walk up and back driveway RA  2 x then checks 02 sats 88-89% then walks 2 more  Times was easier before surgery and not on 02 at that point  but still on BREO Finished amio one week prior to OV   Wearing 02 hs  3lpm  rec Stop breo and  combivent start stiolto 2 pffs each am  Only use your albuterol (proair) as a rescue medication 02  2lpm sleeping and walking  No need for 02 inside the house with activties during the day  unless excercising  Prednisone 10 mg take  4 each am x 2 days,   2 each am x 2 days,  1 each am x 2 days and stop  Please schedule a follow up office visit in 6 weeks, call sooner if needed with pfts    02/22/2015  f/u ov/Jonnathan Birman re: GOLD III copd/ still smoking on anoro / alb 2 puffs around lunch time  Chief Complaint  Patient presents with  . Follow-up    pt following fore COPD with PFT today: pt c/o the regular SOB, and dry cough at times. no c/o wheezing or chest tightness.   walking up to 4 min back and forth to mb on 2lpm   rec Continue Anoro one click each am   Only use your albuterol (proair) as a rescue medication  02  2lpm sleeping and walking  No need for 02 inside the house with activties during the day  unless excercising   Admit date: 05/18/2015 Discharge date: 05/20/2015  Discharge Diagnoses:   COPD exacerbation  Hyperlipidemia  Iron deficiency anemia  Essential hypertension  GERD  Aortic stenosis, severe  Former smoker  S/P TAVR (transcatheter aortic valve replacement)  CAD (coronary artery disease)  Depression  Acute on chronic respiratory failure (HCC)  Elevated lactic acid level   05/24/2015  Extended transition of care  ov/Caresse Sedivy re: COPD  GOLD III criteria/ 02 2 with 2lpm with activity and hs   Chief Complaint  Patient presents with  . Follow-up    Recently admitted to hospital from 05/18/15-03/18/16 with COPD flare. He states had to  to back to ED on 05/21/15 with SOB and had neg CT Angio. He states that his breathing seems better today and breathing is back at baseline.   on ppi pc and anoro plus freq saba in multiple forms  rec Plan A = Automatic = stop Anoro and try BEVESPI  Take 2 puffs first thing in am and then another 2 puffs about 12 hours later.                                     Change protonix to 40mg  Take 30-60 min before first meal of the day and add Pepcid ac 20 mg at bedtime  Plan B = Backup Only use your albuterol as a rescue medication Plan C = Crisis - only use your albuterol nebulizer if you first try Plan B and it fails to help > ok to use the nebulizer up to every 4 hours but if start needing it regularly call for immediate appointment Plan D = Doctor - call me if B and C not adequate Plan E = ER - go to ER or call 911 if all else fails       08/23/2015  Post hosp/ transition of care  ov/Jaydalee Bardwell re: COPD III/ 02 dep/ freq admits Chief Complaint  Patient presents with  . HFU    Breathing has wornsened just over the past 3 days.   doe = MMRC3 = can't walk 100 yards even at a slow pace at a flat grade s stopping due to sob even on 2lpm  Last used  saba am of ov/ very confused with details of care/ sob worse since finished last pred rx    No obvious   day to day or daytime variabilty or assoc excess/ purulent sputum or mucus plugs   cp or chest tightness, subjective wheeze overt sinus or hb symptoms. No unusual exp hx or h/o childhood pna/ asthma or knowledge of premature birth.  Sleeping ok without nocturnal  or early am exacerbation  of respiratory  c/o's or need for noct saba. Also denies any obvious fluctuation of symptoms with weather or environmental changes or other aggravating or alleviating factors except as outlined above   Current Medications, Allergies, Complete Past Medical History, Past Surgical History, Family History, and Social History were reviewed in [employer]  record.  ROS  The following are not active complaints unless bolded sore throat, dysphagia, dental problems, itching, sneezing,  nasal congestion or excess/ purulent secretions, ear ache,   fever, chills, sweats, unintended wt loss, pleuritic or exertional cp, hemoptysis,  orthopnea pnd or leg swelling, presyncope, palpitations, heartburn, abdominal pain, anorexia, nausea, vomiting, diarrhea  or change in bowel or urinary habits, change in stools or urine, dysuria,hematuria,  rash, arthralgias, visual complaints, headache, numbness weakness or ataxia or problems with walking or coordination,  change in mood/affect or memory.          Objective:   Physical Exam    amb wm nad    / vital signs reviewed   06/04/2014        137 > 01/10/2015  136 > 02/22/2015  138 > 05/24/2015  135 > 08/23/2015    134     06/09/13 133 lb (60.328 kg)  04/28/13 128 lb (58.06 kg)  04/04/13 130 lb (58.968 kg)     HEENT mild turbinate edema.  Oropharynx no thrush or excess pnd or cobblestoning.  No JVD or cervical adenopathy. Mild accessory muscle hypertrophy. Trachea midline, nl thryroid. Chest was hyperinflated by percussion with diminished breath sounds and moderate increased exp time with trace bilateral late exp wheeze. Hoover sign positive at mid inspiration. Regular rate and rhythm with  no  gallop or rub or increase P2 or edema.  Abd: no hsm, nl excursion. Ext warm without cyanosis or clubbing.           I personally reviewed images and agree with radiology impression as follows:  CXR:  07/30/15 Hyperinflation and fibrotic changes again noted bilaterally. Stable bilateral calcified pleural plaques. Again noted streaky atelectasis and fibrotic changes in lingula. No definite superimposed infiltrate. No pulmonary edema.      Assessment & Plan:

## 2015-08-23 NOTE — Telephone Encounter (Signed)
VO given     KP

## 2015-08-23 NOTE — Telephone Encounter (Signed)
Last seen 08/13/15 and filled 08/21/15 #30   Please advise     KP

## 2015-08-23 NOTE — Telephone Encounter (Signed)
Please advise      KP 

## 2015-08-23 NOTE — Telephone Encounter (Signed)
Faxed to [facility]. Sent for scanning. JG//CMA

## 2015-08-25 ENCOUNTER — Encounter: Payer: Self-pay | Admitting: Internal Medicine

## 2015-08-25 NOTE — Assessment & Plan Note (Signed)
HC03  12/17/14  32  01/10/2015   Walked RA  2 laps @ 185 ft each stopped due to  Sob with sat 89% at nl pace - 05/24/2015  Walked 2lpm x 3 laps @ 185 ft each stopped due to  End of study, nl pace, no  desat  Min sob   rec as of 08/23/2015  02 2lpm s 24/7

## 2015-08-25 NOTE — Assessment & Plan Note (Signed)
-   PFT's 04/28/2013  FEV1 0.88 (40%) with ratio 44 and 14% better p B2 dlco 45 corrects to 83 - Trial of breo 04/28/2013 > improved symptoms  06/09/2013  - spirometry 06/04/2014 FEV1  0.76 (29%) ratio 45  -01/10/2015   trial of stiolto > insurance declined  - PFT's  02/22/2015  FEV1 0.84 (40 % ) ratio 41  p 5 % improvement from saba with DLCO  40 % corrects to 51 % for alv volume   - 02/22/15  rec rehab/ declined - 05/24/2015  extensive coaching HFA effectiveness =    75% > try change anoro to BEVESPI  - multilple admits > tried on daliresp > no better so 08/23/2015 rec d/c when present bottle used up   - 08/23/2015 pred rx daily 20 if doing poor/ 10 if doing better   I had an extended discussion with the patient reviewing all relevant studies completed to date and  lasting 25 minutes of a 40  minute  Extended transition of care office visit    1) clearly struggling with med reconciliation:  To keep things simple, I have asked the patient to first separate medicines that are perceived as maintenance, that is to be taken daily "no matter what", from those medicines that are taken on only on an as-needed basis and I have given the patient examples of both, and then return to see our NP to generate a  detailed  medication calendar which should be followed until the next physician sees the patient and updates it.   2) The goal with a chronic steroid dependent illness is always arriving at the lowest effective dose that controls the disease/symptoms and not accepting a set "formula" which is based on statistics or guidelines that don't always take into account patient  variability or the natural hx of the dz in every individual patient, which may well vary over time.  For now therefore I recommend the patient maintain a ceiling of 20 and a flor of 10 mg daily   3) Each maintenance medication was reviewed in detail including most importantly the difference between maintenance and prns and under what circumstances the  prns are to be triggered using an action plan format that is not reflected in the computer generated alphabetically organized AVS.    Please see instructions for details which were reviewed in writing and the patient given a copy highlighting the part that I personally wrote and discussed at today's ov.

## 2015-08-27 ENCOUNTER — Other Ambulatory Visit: Payer: Self-pay

## 2015-08-27 DIAGNOSIS — D509 Iron deficiency anemia, unspecified: Secondary | ICD-10-CM

## 2015-08-29 ENCOUNTER — Telehealth: Payer: Self-pay | Admitting: *Deleted

## 2015-08-29 ENCOUNTER — Other Ambulatory Visit: Payer: Self-pay | Admitting: Family Medicine

## 2015-08-29 NOTE — Telephone Encounter (Signed)
Forwarded to Dr. Lowne-Chase. JG//CMA 

## 2015-08-29 NOTE — Telephone Encounter (Signed)
Med filled.  

## 2015-09-02 ENCOUNTER — Other Ambulatory Visit: Payer: Self-pay

## 2015-09-02 DIAGNOSIS — I10 Essential (primary) hypertension: Secondary | ICD-10-CM

## 2015-09-02 MED ORDER — METOPROLOL TARTRATE 25 MG PO TABS: 12.5000 mg | ORAL_TABLET | Freq: Two times a day (BID) | ORAL | Status: DC

## 2015-09-02 NOTE — Telephone Encounter (Signed)
I need to know if its okay to fill this patients metoprolol. He was prescribed the medicine in the hospital. Thank you for your time.

## 2015-09-03 NOTE — Telephone Encounter (Signed)
Forms reviewed and signed by provider.  Forms mailed back to [place].

## 2015-09-06 ENCOUNTER — Ambulatory Visit (INDEPENDENT_AMBULATORY_CARE_PROVIDER_SITE_OTHER): Payer: Medicare Other | Admitting: Adult Health

## 2015-09-06 ENCOUNTER — Encounter: Payer: Self-pay | Admitting: Adult Health

## 2015-09-06 VITALS — BP 142/76 | HR 72 | Temp 97.6°F | Ht 62.0 in | Wt 134.0 lb

## 2015-09-06 DIAGNOSIS — J449 Chronic obstructive pulmonary disease, unspecified: Secondary | ICD-10-CM

## 2015-09-06 DIAGNOSIS — J9611 Chronic respiratory failure with hypoxia: Secondary | ICD-10-CM

## 2015-09-06 DIAGNOSIS — J9612 Chronic respiratory failure with hypercapnia: Secondary | ICD-10-CM

## 2015-09-06 NOTE — Progress Notes (Signed)
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 acei cough   History of Present Illness  03/27/2013 1st [facility] office visit/ Ronald Bell still active smoker cc recurrent episodes of severe refractory cough this episode started x 3 weeks prior to OV   but comes and goes since early fall 2014 assoc with doe x  walking garbage to street x 3 years better up to several hours after combivent on tudorza bid miant as well.  Cough is variably prod of thick yellow mucus but min amt, mostly prod in am and dry rest of the day.  Has noct choking spells just about every night p lies down better if sits up and takes combivent - they feel almost  like something strangling him.  rec Stop lisinopril Benicar 20/12.5 one daily  Pantoprazole (protonix) 40 mg   Take 30-60 min before first meal of the day and Pepcid ac 20 mg one bedtime until return to office   The key is to stop smoking completely before smoking completely stops you!    04/04/2013  Acute  ov/Wert re: refractory cough  Chief Complaint  Patient presents with  . Acute Visit    Pt c/o increased cough since last visit. Cough is non prod and worse at night and early am. He states that he gets SOB with coughing spells and is using combivent 5 times daily. He states had episode of syncope last night and EMS was called.    really not sob unless coughing. No excess mucus, present 24/7  rec Stop tudorza Prednisone 10 mg take  4 each am x 2 days,   2 each am x 2 days,  1 each am x 2 days and stop  mucinex dm 1200 twice daily as long as you feel the urge to cough and supplement it with Tramadol 50 up to 2 to every 4 hours- once the cough is gone back off of it. Prednisone 10 mg take  4 each  am x 2 days,   2 each am x 2 days,  1 each am x 2 days and stop  Pantoprazole (protonix) 40 mg   Take 30-60 min before first meal of the day and Pepcid 20 mg one bedtime until return to office - this is the best way to tell whether stomach acid is contributing to your problem.   GERD diet   04/28/2013 f/u ov/Wert re: GOLD III COPD / cough much better off ACEi Chief Complaint  Patient presents with  . Follow-up    Pt states breathing is slightly improved and cough is much improved. He is using combivent approx 4 times per day.   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 Acute Visit    Pt c/o increased SOB off and on since Feb 2016. He states that he wakes up in the night feeling out of breath and gets SOB walking from room to room at home. He is using combivent 5-6 x per day.    sleeping propped up now using noct combivent as well as q am breo   Cancelled ov with Ronald Bell today "I wanted to get my breathing right first" rec Prednisone 10 mg take  4 each am x 2 days,   2 each am x 2 days,  1 each am x 2 days and stop  Add pepcid 20 mg at bedtime (over the counter) Prop up as much as possible at bedtime  No change Breo each am combivent up to 1 puff every 6 hours as needed  Keep appt with T  surgery    Last T surgery note  12/24/14  Patient returns for routine follow-up up approximately one month status post transcatheter aortic valve replacement using a 26 mm Edwards Sapien XT transcatheter heart valve placed via trans-apical approach on 11/20/2014. His early postoperative recovery in the hospital was notable for paroxysmal atrial fibrillation for which he was treated with amiodarone. He was discharged from the hospital on the seventh postoperative day in sinus rhythm. Because of his history of recurrent GI bleeding in the past he was not anticoagulated using warfarin. Dual antiplatelets therapy using aspirin and Plavix was resumed because of recent PCI and stenting of the right coronary artery. He was readmitted to the hospital one week later with melena and symptomatic anemia with hemoglobin 5.1. He was transfused a total of 5 units packed red blood cells and underwent both upper and lower endoscopy with ablation of multiple AVMs. During his hospitalization he also underwent left thoracentesis for a moderate sized left pleural effusion. He was eventually discharged home and he returns to our office today for routine follow-up. He was seen in follow-up earlier today by Ronald. [name]. He has not had any further signs of GI bleeding and his hemoglobin has remained stable 11.5. The patient states that he is still limited by exertional shortness of breath that is multifactorial and related to his underlying COPD and chronic diastolic congestive heart failure. He has not had any chest pain or chest tightness. Appetite is good. Home health physical therapy has been working with him and he has been making slow but steady progress. He does not wish to participate in outpatient cardiac rehabilitation program.    01/10/2015  Ext f/u  ov/Wert re:  Re-establish care post op COPD GOLD III on Breo and prn combivent  Chief Complaint  Patient presents with  . Follow-up    COPD f/u pt. states breathing has  wrosen. SOB with activity. no wheezing. no cough. no chest pain/tightness. on 3L O2 pulse.   Can walk up and back driveway RA  2 x then checks 02 sats 88-89% then walks 2 more  Times was easier before surgery and not on 02 at that point  but still on BREO Finished amio one week prior to OV   Wearing 02 hs  3lpm  rec Stop breo and  combivent start stiolto 2 pffs each am  Only use your albuterol (proair) as a rescue medication 02  2lpm sleeping and walking  No need for 02 inside the house with activties during the day  unless excercising  Prednisone 10 mg take  4 each am x 2 days,   2 each am x 2 days,  1 each am x 2 days and stop  Please schedule a follow up office visit in 6 weeks, call sooner if needed with pfts    02/22/2015  f/u ov/Wert re: GOLD III copd/ still smoking on anoro / alb 2 puffs around lunch time  Chief Complaint  Patient presents with  . Follow-up    pt following fore COPD with PFT today: pt c/o the regular SOB, and dry cough at times. no c/o wheezing or chest tightness.   walking up to 4 min back and forth to mb on 2lpm   rec Continue Anoro one click each am   Only use your albuterol (proair) as a rescue medication  02  2lpm sleeping and walking  No need for 02 inside the house with activties during the day  unless excercising   Admit date: 05/18/2015 Discharge date: 05/20/2015  Discharge Diagnoses:   COPD exacerbation  Hyperlipidemia  Iron deficiency anemia  Essential hypertension  GERD  Aortic stenosis, severe  Former smoker  S/P TAVR (transcatheter aortic valve replacement)  CAD (coronary artery disease)  Depression  Acute on chronic respiratory failure (HCC)  Elevated lactic acid level   05/24/2015  Extended transition of care  ov/Wert re: COPD  GOLD III criteria/ 02 2 with 2lpm with activity and hs   Chief Complaint  Patient presents with  . Follow-up    Recently admitted to hospital from 05/18/15-03/18/16 with COPD flare. He states had to  to back to ED on 05/21/15 with SOB and had neg CT Angio. He states that his breathing seems better today and breathing is back at baseline.   on ppi pc and anoro plus freq saba in multiple forms  rec Plan A = Automatic = stop Anoro and try BEVESPI  Take 2 puffs first thing in am and then another 2 puffs about 12 hours later.                                     Change protonix to 40mg  Take 30-60 min before first meal of the day and add Pepcid ac 20 mg at bedtime  Plan B = Backup Only use your albuterol as a rescue medication Plan C = Crisis - only use your albuterol nebulizer if you first try Plan B and it fails to help > ok to use the nebulizer up to every 4 hours but if start needing it regularly call for immediate appointment Plan D = Doctor - call me if B and C not adequate Plan E = ER - go to ER or call 911 if all else fails       08/23/2015  Post hosp/ transition of care  ov/Wert re: COPD III/ 02 dep/ freq admits Chief Complaint  Patient presents with  . HFU    Breathing has wornsened just over the past 3 days.   doe = MMRC3 = can't walk 100 yards even at a slow pace at a flat grade s stopping due to sob even on 2lpm  Last used  saba am of ov/ very confused with details of care/ sob worse since finished last pred rx   >>Continue on Brovana and budesonide nebulizer twice daily, Incruse  09/06/2015 Follow up : COPD III /O2 dep  Patient returns for a two-week follow-up. Since last visit. Patient says he is doing good.  Recent exacerbation , COPD with hospitalization. Patient feels that he is starting to get stronger. Feels he is getting stronger, has not used duoneb or ventolin much at all.  Denies chest pain, orthopnea, edema or hemoptyis s.   We reviewed all his medications organize them into a medication calendar  with patient education. Says he cannot afford daliresp. Seems to know his med well .  Has a weekly pill box with daily sections.  PVX and Prevnar utd.      Current  Medications, Allergies, Complete Past Medical History, Past Surgical History, Family History, and Social History were reviewed in [employer] record.  ROS  The following are not active complaints unless bolded sore throat, dysphagia, dental problems, itching, sneezing,  nasal congestion or excess/ purulent secretions, ear ache,   fever, chills, sweats, unintended wt loss, pleuritic or exertional cp, hemoptysis,  orthopnea pnd or leg swelling, presyncope, palpitations, heartburn, abdominal pain, anorexia, nausea, vomiting, diarrhea  or change in bowel or urinary habits, change in stools or urine, dysuria,hematuria,  rash, arthralgias, visual complaints, headache, numbness weakness or ataxia or problems with walking or coordination,  change in mood/affect or memory.          Objective:   Physical Exam    amb wm nad    / vital signs reviewed  Filed Vitals:   09/06/15 1405  BP: 142/76  Pulse: 72  Temp: 97.6 F (36.4 C)  TempSrc: Oral  Height: 5\' 2"  (1.575 m)  Weight: 134 lb (60.782 kg)  SpO2: 95%     06/04/2014        137 > 01/10/2015  136 > 02/22/2015  138 > 05/24/2015  135 > 08/23/2015    134      HEENT mild turbinate edema.  Oropharynx no thrush or excess pnd or cobblestoning.  No JVD or cervical adenopathy. Mild accessory muscle hypertrophy. Trachea midline, nl thryroid. Chest was hyperinflated by percussion with diminished breath sounds and moderate increased exp time  Hoover sign positive at mid inspiration. Regular rate and rhythm with  no  gallop or rub or increase P2 or edema.  Abd: no hsm, nl excursion. Ext warm without cyanosis or clubbing.            CXR:  07/30/15 Hyperinflation and fibrotic changes again noted bilaterally. Stable bilateral calcified pleural plaques. Again noted streaky atelectasis and fibrotic changes in lingula. No definite superimposed infiltrate. No pulmonary edema.       Imara Standiford NP-C  [facility]   09/06/2015

## 2015-09-06 NOTE — Assessment & Plan Note (Signed)
Cont on o2 .  

## 2015-09-06 NOTE — Assessment & Plan Note (Signed)
Doing well with not flare  Patient's medications were reviewed today and patient education was given. Computerized medication calendar was adjusted/completed   Plan  No changes

## 2015-09-06 NOTE — Patient Instructions (Signed)
Follow med calendar closely and bring to each visit Follow up with Dr. [name] in 3 months and as needed

## 2015-09-08 NOTE — Progress Notes (Signed)
Chart and office note reviewed in detail  > agree with a/p as outlined    

## 2015-09-12 NOTE — Addendum Note (Signed)
Addended by: Osa Craver on: 09/12/2015 11:41 AM   Modules accepted: Orders, Medications

## 2015-09-13 ENCOUNTER — Other Ambulatory Visit: Payer: Self-pay | Admitting: Family Medicine

## 2015-09-16 ENCOUNTER — Other Ambulatory Visit: Payer: Self-pay | Admitting: Family Medicine

## 2015-09-16 ENCOUNTER — Telehealth: Payer: Self-pay | Admitting: Adult Health

## 2015-09-16 NOTE — Telephone Encounter (Signed)
Requesting Temazepam 30mg -Take 1 capsule by mouth at bedtime as needed for sleep.                     Alprazolam 0.5mg -Take 1 tablet by mouth three times daily as needed for anxiety. Last refill:08/23/15;#30,0                07/22/15;#90,0-Reported pt not taking on (09/12/15) Last OV:08/13/15 UDS:02/12/14-No results noted Please advise.//AB/CMA

## 2015-09-16 NOTE — Telephone Encounter (Signed)
Spoke with pt. States that he needed to add a medication to his list. Alprazolam has been added per the pt. Nothing further was needed.

## 2015-10-01 ENCOUNTER — Other Ambulatory Visit: Payer: Self-pay | Admitting: Internal Medicine

## 2015-10-01 MED ORDER — UMECLIDINIUM BROMIDE 62.5 MCG/INH IN AEPB: 1.0000 | INHALATION_SPRAY | Freq: Every day | RESPIRATORY_TRACT | Status: DC

## 2015-10-11 ENCOUNTER — Other Ambulatory Visit: Payer: Self-pay | Admitting: Family Medicine

## 2015-10-11 NOTE — Telephone Encounter (Signed)
Rx printed, awaiting MD signature.  

## 2015-10-11 NOTE — Telephone Encounter (Signed)
Pt is requesting refill on Temazepam.   Last OV: 08/13/2015 Last Fill: 09/16/2015 #90 and 0RF (Several days early) UDS: None  Please advise.

## 2015-10-11 NOTE — Telephone Encounter (Signed)
Refill x1 

## 2015-10-12 ENCOUNTER — Other Ambulatory Visit: Payer: Self-pay | Admitting: Family Medicine

## 2015-10-14 NOTE — Telephone Encounter (Signed)
Last alprazolam Rx: 09/16/15, #90 Last OV: 08/13/15 Next OV: 10/2015 UDS: 02/12/14. Needs uds at next OV.  Rx printed and forwarded to PCP for signature.

## 2015-10-16 ENCOUNTER — Other Ambulatory Visit: Payer: Self-pay | Admitting: *Deleted

## 2015-10-16 DIAGNOSIS — Z952 Presence of prosthetic heart valve: Secondary | ICD-10-CM

## 2015-10-16 DIAGNOSIS — I350 Nonrheumatic aortic (valve) stenosis: Secondary | ICD-10-CM

## 2015-10-24 ENCOUNTER — Telehealth: Payer: Self-pay | Admitting: Family Medicine

## 2015-10-24 NOTE — Telephone Encounter (Signed)
Tammy is calling to get verbals to continue services for palliative care [facility])   freq- 1-3 times monthly for nursing visits

## 2015-10-24 NOTE — Telephone Encounter (Signed)
Please advise      KP 

## 2015-10-24 NOTE — Telephone Encounter (Signed)
Verbal given to Tammy.     KP

## 2015-10-24 NOTE — Telephone Encounter (Signed)
Ok to give verbal 

## 2015-10-29 ENCOUNTER — Telehealth: Payer: Self-pay

## 2015-10-29 NOTE — Telephone Encounter (Signed)
Pt aware.

## 2015-10-29 NOTE — Telephone Encounter (Signed)
-----   Message from Algernon Huxley, RN sent at 05/02/2015  4:18 PM EST ----- Regarding: CBC CBC in 6 mth, order in epic.

## 2015-11-04 ENCOUNTER — Telehealth: Payer: Self-pay | Admitting: *Deleted

## 2015-11-04 NOTE — Telephone Encounter (Addendum)
Received Physician's Orders/Plan of Care paperwork via mail from [place].   Billing sheet attached and placed in folder for Dr. Carollee Herter to review and sign.//AB/CMA

## 2015-11-05 ENCOUNTER — Other Ambulatory Visit: Payer: Self-pay

## 2015-11-05 ENCOUNTER — Other Ambulatory Visit (INDEPENDENT_AMBULATORY_CARE_PROVIDER_SITE_OTHER): Payer: Medicare Other

## 2015-11-05 DIAGNOSIS — D509 Iron deficiency anemia, unspecified: Secondary | ICD-10-CM | POA: Diagnosis not present

## 2015-11-05 LAB — CBC WITH DIFFERENTIAL/PLATELET
BASOS ABS: 0 10*3/uL (ref 0.0–0.1)
Basophils Relative: 0.4 % (ref 0.0–3.0)
EOS ABS: 0.3 10*3/uL (ref 0.0–0.7)
Eosinophils Relative: 3.8 % (ref 0.0–5.0)
HEMATOCRIT: 38.3 % — AB (ref 39.0–52.0)
HEMOGLOBIN: 12.8 g/dL — AB (ref 13.0–17.0)
LYMPHS PCT: 9.8 % — AB (ref 12.0–46.0)
Lymphs Abs: 0.8 10*3/uL (ref 0.7–4.0)
MCHC: 33.4 g/dL (ref 30.0–36.0)
MCV: 90.9 fl (ref 78.0–100.0)
MONO ABS: 0.7 10*3/uL (ref 0.1–1.0)
Monocytes Relative: 8.2 % (ref 3.0–12.0)
Neutro Abs: 6.7 10*3/uL (ref 1.4–7.7)
Neutrophils Relative %: 77.8 % — ABNORMAL HIGH (ref 43.0–77.0)
Platelets: 194 10*3/uL (ref 150.0–400.0)
RBC: 4.22 Mil/uL (ref 4.22–5.81)
RDW: 16.7 % — ABNORMAL HIGH (ref 11.5–15.5)
WBC: 8.6 10*3/uL (ref 4.0–10.5)

## 2015-11-07 NOTE — Telephone Encounter (Signed)
Received signed Physician's Orders/ Plan of Care from Dr. Carollee Herter.  Paperwork mailed to [facility].//AB/CMA

## 2015-11-08 ENCOUNTER — Ambulatory Visit: Payer: Medicare Other | Admitting: Family Medicine

## 2015-11-11 ENCOUNTER — Telehealth: Payer: Self-pay | Admitting: Family Medicine

## 2015-11-11 NOTE — Telephone Encounter (Signed)
Last seen 08/13/15 and filled   Xanax 10/14/15 #90 Temazepam 10/11/15 #30    Please advise    KP

## 2015-11-11 NOTE — Telephone Encounter (Signed)
Patient is requesting a call back about this medication refill, he says the pharmacy said dr denied refill due to provider wanting to change medication, Patient asking for a month refill to hold him until his visit [phone] patient's call back number

## 2015-11-11 NOTE — Telephone Encounter (Signed)
Med's have been faxed.     KP 

## 2015-11-11 NOTE — Telephone Encounter (Addendum)
Relation to [id number] Call back [phone] Pharmacy: [facility], [place] [phone] (Phone) [phone] (Fax)     Reason for call:  Patient checking on the status of medication request; ALPRAZolam (XANAX) 0.5 MG tablet, patient states he just got off the phone with pharmacy and they never receieved temazepam (RESTORIL) 30 MG capsule. Please advise

## 2015-11-15 ENCOUNTER — Other Ambulatory Visit: Payer: Self-pay | Admitting: Internal Medicine

## 2015-11-15 ENCOUNTER — Telehealth: Payer: Self-pay | Admitting: Internal Medicine

## 2015-11-15 MED ORDER — ARFORMOTEROL TARTRATE 15 MCG/2ML IN NEBU: 15.0000 ug | INHALATION_SOLUTION | Freq: Two times a day (BID) | RESPIRATORY_TRACT | 3 refills | Status: DC

## 2015-11-15 NOTE — Telephone Encounter (Signed)
Called spoke with pt. He is requesting a refill on his brovana. Verified pharmacy as [facility] on [address]. I explained to him that I would send rx today. He voiced understanding and had no further questions. rx sent.

## 2015-11-18 ENCOUNTER — Ambulatory Visit: Payer: Medicare Other | Admitting: Thoracic Surgery (Cardiothoracic Vascular Surgery)

## 2015-11-19 ENCOUNTER — Encounter: Payer: Self-pay | Admitting: Cardiology

## 2015-11-22 ENCOUNTER — Other Ambulatory Visit: Payer: Self-pay | Admitting: Family Medicine

## 2015-11-29 ENCOUNTER — Ambulatory Visit (HOSPITAL_COMMUNITY)
Admission: RE | Admit: 2015-11-29 | Discharge: 2015-11-29 | Disposition: A | Discharge status: Home / Self Care | Payer: Medicare Other | Source: Ambulatory Visit | Attending: Thoracic Surgery (Cardiothoracic Vascular Surgery) | Admitting: Thoracic Surgery (Cardiothoracic Vascular Surgery)

## 2015-11-29 DIAGNOSIS — J449 Chronic obstructive pulmonary disease, unspecified: Secondary | ICD-10-CM | POA: Diagnosis not present

## 2015-11-29 DIAGNOSIS — I340 Nonrheumatic mitral (valve) insufficiency: Secondary | ICD-10-CM | POA: Diagnosis not present

## 2015-11-29 DIAGNOSIS — Z954 Presence of other heart-valve replacement: Secondary | ICD-10-CM | POA: Diagnosis not present

## 2015-11-29 DIAGNOSIS — I10 Essential (primary) hypertension: Secondary | ICD-10-CM | POA: Diagnosis not present

## 2015-11-29 DIAGNOSIS — I350 Nonrheumatic aortic (valve) stenosis: Secondary | ICD-10-CM

## 2015-11-29 DIAGNOSIS — Z952 Presence of prosthetic heart valve: Secondary | ICD-10-CM

## 2015-11-29 DIAGNOSIS — I2510 Atherosclerotic heart disease of native coronary artery without angina pectoris: Secondary | ICD-10-CM | POA: Insufficient documentation

## 2015-11-29 DIAGNOSIS — Z953 Presence of xenogenic heart valve: Secondary | ICD-10-CM | POA: Diagnosis not present

## 2015-11-29 LAB — ECHOCARDIOGRAM COMPLETE
AO mean calculated velocity dopler: 259 cm/s
AOPV: 0.25 m/s
AV Area VTI index: 0.46 cm2/m2
AV Area VTI: 0.72 cm2
AV Area mean vel: 0.69 cm2
AV Mean grad: 30 mmHg
AV area mean vel ind: 0.42 cm2/m2
AV vel: 0.75
AVCELMEANRAT: 0.24
AVPG: 48 mmHg
AVPKVEL: 345 cm/s
CHL CUP AV PEAK INDEX: 0.44
CHL CUP AV VALUE AREA INDEX: 0.46
CHL CUP DOP CALC LVOT VTI: 24.7 cm
CHL CUP MV DEC (S): 257
CHL CUP STROKE VOLUME: 40 mL
E/e' ratio: 11.54
EWDT: 257 ms
FS: 35 % (ref 28–44)
IV/PV OW: 1.03
LA ID, A-P, ES: 37 mm
LA diam index: 2.25 cm/m2
LA vol: 51.4 mL
LAVOLA4C: 47.3 mL
LAVOLIN: 31.3 mL/m2
LEFT ATRIUM END SYS DIAM: 37 mm
LV E/e' medial: 11.54
LV E/e'average: 11.54
LV PW d: 9.4 mm — AB (ref 0.6–1.1)
LV dias vol index: 57 mL/m2
LV dias vol: 94 mL (ref 62–150)
LV sys vol: 54 mL (ref 21–61)
LVELAT: 6.64 cm/s
LVOT SV: 70 mL
LVOT area: 2.84 cm2
LVOT peak VTI: 0.26 cm
LVOT peak vel: 87.9 cm/s
LVOTD: 19 mm
LVSYSVOLIN: 33 mL/m2
Lateral S' vel: 7.88 cm/s
MV pk A vel: 96 m/s
MVPG: 2 mmHg
MVPKEVEL: 76.6 m/s
Simpson's disk: 43
TAPSE: 21.4 mm
TDI e' lateral: 6.64
TDI e' medial: 4.13
VTI: 93.9 cm
Valve area: 0.75 cm2

## 2015-11-29 NOTE — Progress Notes (Signed)
Echocardiogram 2D Echocardiogram has been performed.  Aggie Cosier 11/29/2015, 11:29 AM

## 2015-12-02 ENCOUNTER — Ambulatory Visit (INDEPENDENT_AMBULATORY_CARE_PROVIDER_SITE_OTHER): Payer: Medicare Other | Admitting: Thoracic Surgery (Cardiothoracic Vascular Surgery)

## 2015-12-02 ENCOUNTER — Encounter: Payer: Self-pay | Admitting: Thoracic Surgery (Cardiothoracic Vascular Surgery)

## 2015-12-02 VITALS — BP 148/74 | HR 91 | Resp 18 | Ht 62.0 in | Wt 140.0 lb

## 2015-12-02 DIAGNOSIS — J449 Chronic obstructive pulmonary disease, unspecified: Secondary | ICD-10-CM | POA: Diagnosis not present

## 2015-12-02 DIAGNOSIS — I2510 Atherosclerotic heart disease of native coronary artery without angina pectoris: Secondary | ICD-10-CM | POA: Diagnosis not present

## 2015-12-02 DIAGNOSIS — Z954 Presence of other heart-valve replacement: Secondary | ICD-10-CM

## 2015-12-02 DIAGNOSIS — I350 Nonrheumatic aortic (valve) stenosis: Secondary | ICD-10-CM

## 2015-12-02 DIAGNOSIS — Z953 Presence of xenogenic heart valve: Secondary | ICD-10-CM

## 2015-12-02 DIAGNOSIS — Z952 Presence of prosthetic heart valve: Secondary | ICD-10-CM

## 2015-12-02 NOTE — Progress Notes (Signed)
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  He was last seen in our office on 12/24/2014 at which time he was doing fairly well. He has been followed regularly by Drs. Skains who saw him most recently on 05/27/2015. Over the past 6 months the patient has had problems with worsening shortness of breath including multiple hospitalizations for acute exacerbations of COPD. He tells me he was hospitalized 4 times in the month of May. Since then he has been followed carefully by Dr. [name] who has been adjusting his medical therapy. The patient has remained stable since then but he remains severely limited by exertional shortness of breath. He gets short of breath with very mild activity and occasionally at rest. He is more short of breath if he lays flat in bed and he sleeps on 2 pillows. He denies any history of PND or lower extremity edema. He has never had any chest pain or chest tightness. He has not been smoking. He has not had any further episodes of GI bleeding.   Current Outpatient Prescriptions  Medication Sig Dispense Refill  . albuterol (PROVENTIL HFA;VENTOLIN HFA) 108 (90 Base) MCG/ACT inhaler Inhale 2 puffs into the lungs every 4 (four) hours as needed for wheezing or shortness of breath. (Patient taking differently: Inhale 2 puffs into the lungs every 4 (four) hours as needed for wheezing or shortness of breath ((PLAN B))). ) 1 Inhaler 11  . ALPRAZolam (XANAX) 0.5 MG tablet Take 0.5 mg by mouth 3 (three)  times daily as needed for anxiety.    . ALPRAZolam (XANAX) 0.5 MG tablet TAKE ONE TABLET BY MOUTH THREE TIMES DAILY AS NEEDED FOR ANXIETY 90 tablet 0  . ALPRAZolam (XANAX) 0.5 MG tablet TAKE ONE TABLET BY MOUTH THREE TIMES DAILY AS NEEDED 90 tablet 0  . arformoterol (BROVANA) 15 MCG/2ML NEBU Take 2 mLs (15 mcg total) by nebulization 2 (two) times daily. 120 mL 3  . aspirin 81 MG tablet Take 81 mg by mouth every morning.     Marland Kitchen atorvastatin (LIPITOR) 40 MG tablet TAKE ONE TABLET BY MOUTH ONCE DAILY 90 tablet 1  . budesonide (PULMICORT) 0.5 MG/2ML nebulizer solution Take 2 mLs (0.5 mg total) by nebulization 2 (two) times daily. 240 mL 12  . citalopram (CELEXA) 10 MG tablet TAKE ONE TABLET BY MOUTH ONCE DAILY 30 tablet 5  . docusate sodium (COLACE) 100 MG capsule Take 200 mg by mouth daily.     . famotidine (PEPCID) 20 MG tablet Take 20 mg by mouth at bedtime.     . fenofibrate 160 MG tablet TAKE ONE TABLET BY MOUTH ONCE DAILY (Patient taking differently: TAKE ONE TABLET BY MOUTH EVERY MORNING) 30 tablet 5  . ferrous sulfate 325 (65 FE) MG EC tablet Take 1 tablet (325 mg total) by mouth 2 (two) times daily. 60 tablet 11  . ipratropium-albuterol (DUONEB) 0.5-2.5 (3) MG/3ML SOLN Take 3 mLs by nebulization every 4 (four) hours as needed. Take Nebs three times a day for this week, after  that continue as needed for shortness of breath or wheezing (Patient taking differently: Take 3 mLs by nebulization every 4 (four) hours as needed (SHORTNESS OF BREATH AND WHEEZING ((PLAN C))). ) 360 mL 3  . metoprolol tartrate (LOPRESSOR) 25 MG tablet Take 0.5 tablets (12.5 mg total) by mouth 2 (two) times daily. 30 tablet 6  . Multiple Vitamins-Minerals (CENTRUM SILVER PO) Take 1 tablet by mouth daily.     . OXYGEN Use 2L at bedtime and as needed for shortness of breath    . pantoprazole (PROTONIX) 40 MG tablet Take 1 tablet (40 mg total) by mouth daily. 90 tablet 1  . predniSONE (DELTASONE) 10 MG tablet Take  2 each am  until better, then one daily (Patient taking differently: Take 10mg  daily every morning. Increase to 2 tabs daily until better then return to 1 tab daily for worsening breathing) 100 tablet 2  . sodium chloride (OCEAN) 0.65 % SOLN nasal spray Use as needed for nasal congestion    . temazepam (RESTORIL) 30 MG capsule TAKE ONE CAPSULE BY MOUTH AT BEDTIME AS NEEDED FOR SLEEP 30 capsule 0  . umeclidinium bromide (INCRUSE ELLIPTA) 62.5 MCG/INH AEPB Inhale 1 puff into the lungs daily. 1 each 2   No current facility-administered medications for this visit.       Physical Exam:   BP (!) 148/74   Pulse 91   Resp 18   Ht 5\' 2"  (1.575 m)   Wt 140 lb (63.5 kg)   SpO2 93% Comment: ON RA  BMI 25.61 kg/m   General:  Chronically ill-appearing in no distress  Chest:   Clear and symmetrical breath sounds without wheezing  CV:   Regular rate and rhythm with grade 2/6 systolic murmur heard best along the sternal border  Incisions:  Completely healed  Abdomen:  Soft nontender  Extremities:  Warm and well-perfused, no edema  Diagnostic Tests:   Transthoracic Echocardiography  Patient:    Bibb, Jirsa MR #:       [id number] Study Date: 11/29/2015 Gender:     M Age:        47 Height:     157.5 cm Weight:     60.8 kg BSA:        1.64 m^2 Pt. Status: Room:   ATTENDING    Darylene Price, M.D.  ORDERING     Darylene Price, M.D.  REFERRING    Darylene Price, M.D.  PERFORMING   [facility], Outpatient  SONOGRAPHER  Madelin Rear, RDCS  cc:  ------------------------------------------------------------------- LV EF: 60% -   65%  ------------------------------------------------------------------- Indications:      Aortic stenosis 424.1.  ------------------------------------------------------------------- History:   PMH:  TAVR.  Coronary artery disease.  Chronic obstructive pulmonary disease.  Risk factors:   Hypertension.  ------------------------------------------------------------------- Study Conclusions  - Left ventricle: The cavity size was normal. Wall thickness was   normal. Systolic function was normal. The estimated ejection   fraction was in the range of 60% to 65%. Features are consistent   with a pseudonormal left ventricular filling pattern, with   concomitant abnormal relaxation and increased filling pressure   (grade 2 diastolic dysfunction). - Aortic valve: A bioprosthesis was present. Valve area (VTI): 0.75   cm^2. Valve area (Vmax): 0.72 cm^2. Valve area (Vmean): 0.69   cm^2. - Mitral valve: There was mild regurgitation.  ------------------------------------------------------------------- Study data:  Comparison was made to the study of 07/25/2015.  Study status:  Routine.  Procedure:  The patient reported no pain  pre or post test. Transthoracic echocardiography. Image quality was fair. Study completion:  There were no complications. Transthoracic echocardiography.  M-mode, complete 2D, spectral Doppler, and color Doppler.  Birthdate:  Patient birthdate: [birth year].  Age:  Patient is 75 yr old.  Sex:  Gender: male. BMI: 24.5 kg/m^2.  Blood pressure:     130/68  Patient status: Outpatient.  Study date:  Study date: 11/29/2015. Study time: 10:42 AM.  [facility]:  [facility].  -------------------------------------------------------------------  ------------------------------------------------------------------- Left ventricle:  The global longitudinal strain is -13.2%.  The cavity size was normal. Wall thickness was normal. Systolic function was normal. The estimated ejection fraction was in the range of 60% to 65%. Features are consistent with a pseudonormal left ventricular filling pattern, with concomitant abnormal relaxation and increased filling pressure (grade 2  diastolic dysfunction).  ------------------------------------------------------------------- Aortic valve:  A bioprosthesis was present.  Doppler:   There was no stenosis.   There was no regurgitation.    VTI ratio of LVOT to aortic valve: 0.26. Valve area (VTI): 0.75 cm^2. Indexed valve area (VTI): 0.46 cm^2/m^2. Peak velocity ratio of LVOT to aortic valve: 0.25. Valve area (Vmax): 0.72 cm^2. Indexed valve area (Vmax): 0.44 cm^2/m^2. Mean velocity ratio of LVOT to aortic valve: 0.24. Valve area (Vmean): 0.69 cm^2. Indexed valve area (Vmean): 0.42 cm^2/m^2.    Mean gradient (S): 30 mm Hg. Peak gradient (S): 48 mm Hg.  ------------------------------------------------------------------- Aorta:  Aortic root: The aortic root was normal in size. Ascending aorta: The ascending aorta was normal in size.  ------------------------------------------------------------------- Mitral valve:   Structurally normal valve.   Leaflet separation was normal.  Doppler:  Transvalvular velocity was within the normal range. There was no evidence for stenosis. There was mild regurgitation.    Peak gradient (D): 2 mm Hg.  ------------------------------------------------------------------- Left atrium:  The atrium was normal in size.  ------------------------------------------------------------------- Right ventricle:  The cavity size was normal. Systolic function was normal.  ------------------------------------------------------------------- Pulmonic valve:    The valve appears to be grossly normal. Doppler:  There was no significant regurgitation.  ------------------------------------------------------------------- Tricuspid valve:   The valve appears to be grossly normal. Doppler:  There was no significant regurgitation.  ------------------------------------------------------------------- Right atrium:  The atrium was normal in  size.  ------------------------------------------------------------------- Pericardium:  There was no pericardial effusion.  ------------------------------------------------------------------- Measurements   Left ventricle                            Value          Reference  LV ID, ED, PLAX chordal                   48.5  mm       43 - 52  LV ID, ES, PLAX chordal                   31.3  mm       23 - 38  LV fx shortening, PLAX chordal            35    %        >=29  LV PW thickness, ED                       9.4   mm       ---------  IVS/LV PW ratio, ED  1.03           <=1.3  Stroke volume, 2D                         70    ml       ---------  Stroke volume/bsa, 2D                     43    ml/m^2   ---------  LV ejection fraction, 1-p A4C             42    %        ---------  LV end-diastolic volume, 2-p              94    ml       ---------  LV end-systolic volume, 2-p               54    ml       ---------  LV ejection fraction, 2-p                 43    %        ---------  Stroke volume, 2-p                        40    ml       ---------  LV end-diastolic volume/bsa, 2-p          57    ml/m^2   ---------  LV end-systolic volume/bsa, 2-p           33    ml/m^2   ---------  Stroke volume/bsa, 2-p                    24.4  ml/m^2   ---------  LV e&', lateral                            6.64  cm/s     ---------  LV E/e&', lateral                          11.54          ---------  LV e&', medial                             4.13  cm/s     ---------  LV E/e&', medial                           18.55          ---------  LV e&', average                            5.39  cm/s     ---------  LV E/e&', average                          14.22          ---------  Longitudinal strain, TDI                  13    %        ---------    Ventricular  septum                        Value          Reference  IVS thickness, ED                         9.68  mm       ---------    LVOT                                       Value          Reference  LVOT ID, S                                19    mm       ---------  LVOT area                                 2.84  cm^2     ---------  LVOT peak velocity, S                     87.9  cm/s     ---------  LVOT mean velocity, S                     62.7  cm/s     ---------  LVOT VTI, S                               24.7  cm       ---------    Aortic valve                              Value          Reference  Aortic valve peak velocity, S             345   cm/s     ---------  Aortic valve mean velocity, S             259   cm/s     ---------  Aortic valve VTI, S                       93.9  cm       ---------  Aortic mean gradient, S                   30    mm Hg    ---------  Aortic peak gradient, S                   48    mm Hg    ---------  VTI ratio, LVOT/AV                        0.26           ---------  Aortic valve area, VTI                    0.75  cm^2     ---------  Aortic valve area/bsa, VTI                0.46  cm^2/m^2 ---------  Velocity ratio, peak, LVOT/AV             0.25           ---------  Aortic valve area, peak velocity          0.72  cm^2     ---------  Aortic valve area/bsa, peak               0.44  cm^2/m^2 ---------  velocity  Velocity ratio, mean, LVOT/AV             0.24           ---------  Aortic valve area, mean velocity          0.69  cm^2     ---------  Aortic valve area/bsa, mean               0.42  cm^2/m^2 ---------  velocity    Aorta                                     Value          Reference  Aortic root ID, ED                        30    mm       ---------    Left atrium                               Value          Reference  LA ID, A-P, ES                            37    mm       ---------  LA ID/bsa, A-P                     (H)    2.25  cm/m^2   <=2.2  LA volume, S                              51.4  ml       ---------  LA volume/bsa, S                          31.3  ml/m^2    ---------  LA volume, ES, 1-p A4C                    47.3  ml       ---------  LA volume/bsa, ES, 1-p A4C                28.8  ml/m^2   ---------  LA volume, ES, 1-p A2C                    52.6  ml       ---------  LA volume/bsa, ES, 1-p A2C                32    ml/m^2   ---------  Mitral valve                              Value          Reference  Mitral E-wave peak velocity               76.6  cm/s     ---------  Mitral A-wave peak velocity               96    cm/s     ---------  Mitral deceleration time           (H)    257   ms       150 - 230  Mitral peak gradient, D                   2     mm Hg    ---------  Mitral E/A ratio, peak                    0.8            ---------    Systemic veins                            Value          Reference  Estimated CVP                             3     mm Hg    ---------    Right ventricle                           Value          Reference  TAPSE                                     21.4  mm       ---------  RV s&', lateral, S                         7.88  cm/s     ---------  Legend: (L)  and  (H)  mark values outside specified reference range.  ------------------------------------------------------------------- Prepared and Electronically Authenticated by  Mertie Moores, M.D. 2017-09-08T13:45:53  Transthoracic Echocardiography  Patient:    Keil, Corazza MR #:       [id number] Study Date: 07/25/2015 Gender:     M Age:        44 Height:     157.5 cm Weight:     56.7 kg BSA:        1.58 m^2 Pt. Status: Room:       [id number]   Kerin Salen, Ripudeep K  REFERRING    Rai, Ripudeep K  ADMITTING    Etta Quill  PERFORMING   [facility], Inpatient  SONOGRAPHER  Mikki Santee  ATTENDING    Little, Wenda Overland  cc:  ------------------------------------------------------------------- LV EF: 60% -   65%  ------------------------------------------------------------------- Indications:      CHF -  428.0.  ------------------------------------------------------------------- History:   PMH:   Chronic obstructive pulmonary disease.  Risk factors:  Former tobacco use. Hypertension. Dyslipidemia.  -------------------------------------------------------------------  Study Conclusions  - Left ventricle: The cavity size was normal. Wall thickness was   increased in a pattern of moderate LVH. Systolic function was   normal. The estimated ejection fraction was in the range of 60%   to 65%. - Left atrium: The atrium was mildly dilated.  ------------------------------------------------------------------- Labs, prior tests, procedures, and surgery: S/p TAVR.          Transthoracic echocardiography.  M-mode, complete 2D, spectral Doppler, and color Doppler.  Birthdate: Patient birthdate: 13-Nov-1940.  Age:  Patient is 75 yr old.  Sex: Gender: male.    BMI: 22.9 kg/m^2.  Blood pressure:     119/63 Patient status:  Inpatient.  Study date:  Study date: 07/25/2015. Study time: 10:23 AM.  Location:  [facility].  -------------------------------------------------------------------  ------------------------------------------------------------------- Left ventricle:  The cavity size was normal. Wall thickness was increased in a pattern of moderate LVH. Systolic function was normal. The estimated ejection fraction was in the range of 60% to 65%.  ------------------------------------------------------------------- Aortic valve:  AV is not well imaged Peak and mean gradients through the valve are 27 and 15 mm Hg respectively consistent with mild AS  Doppler:  There was no significant regurgitation.    VTI ratio of LVOT to aortic valve: 0.4. Valve area (VTI): 1 cm^2. Indexed valve area (VTI): 0.63 cm^2/m^2. Peak velocity ratio of LVOT to aortic valve: 0.35. Valve area (Vmax): 0.88 cm^2. Indexed valve area (Vmax): 0.56 cm^2/m^2. Mean velocity ratio of LVOT to aortic valve: 0.31. Valve  area (Vmean): 0.78 cm^2. Indexed valve area (Vmean): 0.49 cm^2/m^2.    Mean gradient (S): 14 mm Hg. Peak gradient (S): 27 mm Hg.  ------------------------------------------------------------------- Aorta:  The aorta was normal, not dilated, and non-diseased.  ------------------------------------------------------------------- Mitral valve:   Mildly thickened leaflets .  Doppler:  There was trivial regurgitation.  ------------------------------------------------------------------- Left atrium:  The atrium was mildly dilated.  ------------------------------------------------------------------- Right ventricle:  The cavity size was normal. Wall thickness was normal. Systolic function was normal.  ------------------------------------------------------------------- Pulmonic valve:   Poorly visualized.  Doppler:  There was no significant regurgitation.  ------------------------------------------------------------------- Tricuspid valve:   Structurally normal valve.   Leaflet separation was normal.  Doppler:  Transvalvular velocity was within the normal range. There was no regurgitation.  ------------------------------------------------------------------- Right atrium:  The atrium was normal in size.  ------------------------------------------------------------------- Pericardium:  There was no pericardial effusion.  ------------------------------------------------------------------- Systemic veins: Inferior vena cava: The vessel was normal in size. The respirophasic diameter changes were in the normal range (>= 50%), consistent with normal central venous pressure.  ------------------------------------------------------------------- Post procedure conclusions Ascending Aorta:  - The aorta was normal, not dilated, and non-diseased.  ------------------------------------------------------------------- Measurements   Left ventricle                            Value           Reference  LV ID, ED, PLAX chordal                   46.1  mm       43 - 52  LV ID, ES, PLAX chordal                   27.5  mm       23 - 38  LV fx shortening, PLAX chordal            40    %        >=  29  LV PW thickness, ED                       15.6  mm       ---------  IVS/LV PW ratio, ED                       0.76           <=1.3  Stroke volume, 2D                         65    ml       ---------  Stroke volume/bsa, 2D                     41    ml/m^2   ---------  LV e&', lateral                            6.58  cm/s     ---------  LV E/e&', lateral                          10.43          ---------  LV e&', medial                             4.5   cm/s     ---------  LV E/e&', medial                           15.24          ---------  LV e&', average                            5.54  cm/s     ---------  LV E/e&', average                          12.38          ---------    Ventricular septum                        Value          Reference  IVS thickness, ED                         11.8  mm       ---------    LVOT                                      Value          Reference  LVOT ID, S                                18    mm       ---------  LVOT area  2.54  cm^2     ---------  LVOT peak velocity, S                     90.2  cm/s     ---------  LVOT mean velocity, S                     52.5  cm/s     ---------  LVOT VTI, S                               25.4  cm       ---------  LVOT peak gradient, S                     3     mm Hg    ---------    Aortic valve                              Value          Reference  Aortic valve peak velocity, S             261   cm/s     ---------  Aortic valve mean velocity, S             171   cm/s     ---------  Aortic valve VTI, S                       64.3  cm       ---------  Aortic mean gradient, S                   14    mm Hg    ---------  Aortic peak gradient, S                   27    mm Hg     ---------  VTI ratio, LVOT/AV                        0.4            ---------  Aortic valve area, VTI                    1     cm^2     ---------  Aortic valve area/bsa, VTI                0.63  cm^2/m^2 ---------  Velocity ratio, peak, LVOT/AV             0.35           ---------  Aortic valve area, peak velocity          0.88  cm^2     ---------  Aortic valve area/bsa, peak               0.56  cm^2/m^2 ---------  velocity  Velocity ratio, mean, LVOT/AV             0.31           ---------  Aortic valve area, mean velocity          0.78  cm^2     ---------  Aortic valve area/bsa, mean  0.49  cm^2/m^2 ---------  velocity    Aorta                                     Value          Reference  Aortic root ID, ED                        22    mm       ---------    Left atrium                               Value          Reference  LA ID, A-P, ES                            41    mm       ---------  LA ID/bsa, A-P                    (H)     2.59  cm/m^2   <=2.2  LA volume, ES, 1-p A4C                    42    ml       ---------  LA volume/bsa, ES, 1-p A4C                26.6  ml/m^2   ---------  LA volume, ES, 1-p A2C                    55    ml       ---------  LA volume/bsa, ES, 1-p A2C                34.8  ml/m^2   ---------    Mitral valve                              Value          Reference  Mitral E-wave peak velocity               68.6  cm/s     ---------  Mitral A-wave peak velocity               86.4  cm/s     ---------  Mitral deceleration time          (H)     275   ms       150 - 230  Mitral E/A ratio, peak                    0.8            ---------    Right ventricle                           Value          Reference  RV s&', lateral, S                         11.4  cm/s     ---------  Legend: (L)  and  (  H)  mark values outside specified reference range.  ------------------------------------------------------------------- Prepared and Electronically  Authenticated by  Dorris Carnes, M.D. 2017-05-04T12:14:42   Transthoracic Echocardiography  Patient:    Mekell, Ditoro MR #:       [id number] Study Date: 12/24/2014 Gender:     M Age:        89 Height:     160 cm Weight:     62.6 kg BSA:        1.68 m^2 Pt. Status: Room:   ATTENDING    Darlina Guys, MD  Willia Craze, Christopher  REFERRING    [place], [place], Outpatient  SONOGRAPHER  Roseanna Rainbow  cc:  ------------------------------------------------------------------- LV EF: 60% -   65%  ------------------------------------------------------------------- Indications:      424.1 Aortic valve disorders.  ------------------------------------------------------------------- History:   PMH:  S/P Aortic valve replacement. Aortic stenosis. Dyspnea.  Coronary artery disease.  Risk factors:  Hypertension. Dyslipidemia.  ------------------------------------------------------------------- Study Conclusions  - Left ventricle: The cavity size was normal. There was mild   concentric hypertrophy. Systolic function was normal. The   estimated ejection fraction was in the range of 60% to 65%. Wall   motion was normal; there were no regional wall motion   abnormalities. Doppler parameters are consistent with abnormal   left ventricular relaxation (grade 1 diastolic dysfunction).   Doppler parameters are consistent with elevated ventricular   end-diastolic filling pressure. - Aortic valve: Mobility was not restricted. Mean gradient (S): 7   mm Hg. Peak gradient (S): 11 mm Hg. Valve area (VTI): 1.46 cm^2.   Valve area (Vmax): 1.58 cm^2. Valve area (Vmean): 1.45 cm^2. - Aortic root: The aortic root was normal in size. - Mitral valve: Structurally normal valve. - Left atrium: The atrium was mildly dilated. - Right ventricle: The cavity size was normal. Wall thickness was   normal. Systolic function was normal. - Right atrium: The atrium  was normal in size. - Tricuspid valve: There was no regurgitation. - Pulmonic valve: There was no regurgitation. - Pulmonary arteries: Systolic pressure couldn&'t be assessed as   there was no tricuspid regurgitation. - Inferior vena cava: The vessel was normal in size. - Pericardium, extracardiac: There was no pericardial effusion.  Impressions:  - There is no difference when compared to the prior study from   12/06/2014, transaortic gradient remain normal.  Transthoracic echocardiography.  M-mode, complete 2D, spectral Doppler, and color Doppler.  Birthdate:  Patient birthdate: 10-18-1940.  Age:  Patient is 75 yr old.  Sex:  Gender: male. BMI: 24.4 kg/m^2.  Blood pressure:     129/71  Patient status: Outpatient.  Study date:  Study date: 12/24/2014. Study time: 11:16 AM.  Location:  Echo laboratory.  -------------------------------------------------------------------  ------------------------------------------------------------------- Left ventricle:  The cavity size was normal. There was mild concentric hypertrophy. Systolic function was normal. The estimated ejection fraction was in the range of 60% to 65%. Wall motion was normal; there were no regional wall motion abnormalities. Doppler parameters are consistent with abnormal left ventricular relaxation (grade 1 diastolic dysfunction). Doppler parameters are consistent with elevated ventricular end-diastolic filling pressure.  ------------------------------------------------------------------- Aortic valve:  A bioprosthetic aortic valve sits well in the aortic position. There is no central regurgitation or paravalvular leak. Normal transaortic gradients. Mobility was not restricted. Doppler:  Transvalvular velocity was within the normal range. There was no stenosis. There was no regurgitation.    VTI ratio of LVOT to aortic valve: 0.47. Valve area (  VTI): 1.46 cm^2. Indexed valve area (VTI): 0.87 cm^2/m^2. Peak velocity  ratio of LVOT to aortic valve: 0.5. Valve area (Vmax): 1.58 cm^2. Indexed valve area (Vmax): 0.94 cm^2/m^2. Mean velocity ratio of LVOT to aortic valve: 0.46. Valve area (Vmean): 1.45 cm^2. Indexed valve area (Vmean): 0.86 cm^2/m^2.    Mean gradient (S): 7 mm Hg. Peak gradient (S): 11 mm Hg.  ------------------------------------------------------------------- Aorta:  Aortic root: The aortic root was normal in size.  ------------------------------------------------------------------- Mitral valve:   Structurally normal valve.   Mobility was not restricted.  Doppler:  Transvalvular velocity was within the normal range. There was no evidence for stenosis. There was trivial regurgitation.    Peak gradient (D): 3 mm Hg.  ------------------------------------------------------------------- Left atrium:  The atrium was mildly dilated.  ------------------------------------------------------------------- Right ventricle:  The cavity size was normal. Wall thickness was normal. Systolic function was normal.  ------------------------------------------------------------------- Pulmonic valve:    Structurally normal valve.   Cusp separation was normal.  Doppler:  Transvalvular velocity was within the normal range. There was no evidence for stenosis. There was no regurgitation.  ------------------------------------------------------------------- Tricuspid valve:   Structurally normal valve.    Doppler: Transvalvular velocity was within the normal range. There was no regurgitation.  ------------------------------------------------------------------- Pulmonary artery:   The main pulmonary artery was normal-sized. Systolic pressure couldn&'t be assessed as there was no tricuspid regurgitation.  ------------------------------------------------------------------- Right atrium:  The atrium was normal in  size.  ------------------------------------------------------------------- Pericardium:  There was no pericardial effusion.  ------------------------------------------------------------------- Systemic veins: Inferior vena cava: The vessel was normal in size.  ------------------------------------------------------------------- Measurements   Left ventricle                            Value          Reference  LV ID, ED, PLAX chordal                   45.9  mm       43 - 52  LV ID, ES, PLAX chordal                   25.5  mm       23 - 38  LV fx shortening, PLAX chordal            44    %        >=29  LV PW thickness, ED                       12    mm       ---------  IVS/LV PW ratio, ED                       1.07           <=1.3  Stroke volume, 2D                         70    ml       ---------  Stroke volume/bsa, 2D                     42    ml/m^2   ---------  LV ejection fraction, 1-p A4C             49    %        ---------  LV end-diastolic volume, 2-p  69    ml       ---------  LV end-systolic volume, 2-p               35    ml       ---------  LV ejection fraction, 2-p                 50    %        ---------  Stroke volume, 2-p                        34    ml       ---------  LV end-diastolic volume/bsa, 2-p          41    ml/m^2   ---------  LV end-systolic volume/bsa, 2-p           21    ml/m^2   ---------  Stroke volume/bsa, 2-p                    20.4  ml/m^2   ---------  LV e&', lateral                            7.62  cm/s     ---------  LV E/e&', lateral                          10.63          ---------  LV e&', medial                             4.9   cm/s     ---------  LV E/e&', medial                           16.53          ---------  LV e&', average                            6.26  cm/s     ---------  LV E/e&', average                          12.94          ---------    Ventricular septum                        Value          Reference  IVS  thickness, ED                         12.8  mm       ---------    LVOT                                      Value          Reference  LVOT ID, S                                20    mm       ---------  LVOT area                                 3.14  cm^2     ---------  LVOT peak velocity, S                     84.8  cm/s     ---------  LVOT mean velocity, S                     56.7  cm/s     ---------  LVOT VTI, S                               22.2  cm       ---------  LVOT peak gradient, S                     3     mm Hg    ---------    Aortic valve                              Value          Reference  Aortic valve peak velocity, S             168   cm/s     ---------  Aortic valve mean velocity, S             123   cm/s     ---------  Aortic valve VTI, S                       47.7  cm       ---------  Aortic mean gradient, S                   7     mm Hg    ---------  Aortic peak gradient, S                   11    mm Hg    ---------  VTI ratio, LVOT/AV                        0.47           ---------  Aortic valve area, VTI                    1.46  cm^2     ---------  Aortic valve area/bsa, VTI                0.87  cm^2/m^2 ---------  Velocity ratio, peak, LVOT/AV             0.5            ---------  Aortic valve area, peak velocity          1.58  cm^2     ---------  Aortic valve area/bsa, peak               0.94  cm^2/m^2 ---------  velocity  Velocity ratio, mean, LVOT/AV             0.46           ---------  Aortic valve area, mean velocity  1.45  cm^2     ---------  Aortic valve area/bsa, mean               0.86  cm^2/m^2 ---------  velocity    Aorta                                     Value          Reference  Aortic root ID, ED                        31    mm       ---------    Left atrium                               Value          Reference  LA ID, A-P, ES                            47    mm       ---------  LA ID/bsa, A-P                    (H)     2.8   cm/m^2    <=2.2  LA volume, S                              48.9  ml       ---------  LA volume/bsa, S                          29.1  ml/m^2   ---------  LA volume, ES, 1-p A4C                    45.5  ml       ---------  LA volume/bsa, ES, 1-p A4C                27.1  ml/m^2   ---------  LA volume, ES, 1-p A2C                    51.8  ml       ---------  LA volume/bsa, ES, 1-p A2C                30.9  ml/m^2   ---------    Mitral valve                              Value          Reference  Mitral E-wave peak velocity               81    cm/s     ---------  Mitral A-wave peak velocity               102   cm/s     ---------  Mitral deceleration time          (H)     246   ms       150 - 230  Mitral peak gradient, D  3     mm Hg    ---------  Mitral E/A ratio, peak                    0.8            ---------    Systemic veins                            Value          Reference  Estimated CVP                             3     mm Hg    ---------    Right ventricle                           Value          Reference  TAPSE                                     21.2  mm       ---------  RV s&', lateral, S                         10.4  cm/s     ---------  Legend: (L)  and  (H)  mark values outside specified reference range.  ------------------------------------------------------------------- Prepared and Electronically Authenticated by  Ena Dawley, M.D. 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  Over the past 6 months he has had worsening problems with exertional shortness breath and several hospitalizations for acute exacerbations, all of which have been blamed on the patient's underlying severe COPD. At present he describes stable but significant symptoms of exertional  shortness of breath with minimal activity and occasionally at rest.  Symptoms have gotten worse over the past 6 months despite the fact the patient quit smoking prior to his transcatheter aortic valve replacement. He has been a bit more stable over the past few months with adjustments in his medical therapy for COPD. Nonetheless, symptoms may be consistent with chronic diastolic congestive heart failure, [place] Heart Association functional class IIIB. The patient states that his breathing is actually worse than it was prior to surgery.  I have personally reviewed the patient's recent follow-up transthoracic echocardiogram. It is difficult to reconcile the report which does not coincide with the findings on the echo. The leaflets of the aortic valve are not well visualized. However, there is clearly no significant aortic insufficiency. The report states "no stenosis" and yet the peak velocity across the aortic valve was reported close to 3.5 m/s corresponding to mean transvalvular gradient estimated 30 mmHg and valve area ranging between 0.69 and 0.75 cm. Peak velocity across the aortic valve measured at the time of the patient's echo last October was reported 1.7 m/s whereas another follow-up echocardiogram performed last May reported a peak velocity across the aortic valve of 2.6 m/s. These findings are concerning for the possibility of significant prosthetic valve dysfunction. Of note, the patient could not be maintained on standard anticoagulation therapy following his  TAVR because of GI bleeding.    Plan:  I think it might be wise to obtain trans-esophageal echocardiogram to further evaluate the patient's aortic valve prosthesis and whether or not there is significant dysfunction, potentially related to thrombosis of the leaflets. The patient is scheduled to see Dr. Marlou Porch in follow-up tomorrow. Will discuss with Dr. Marlou Porch and other members of the team. We have not recommended any changes to the  patient's current medications at this time. All of his questions have been addressed.   I spent in excess of 30 minutes during the conduct of this office consultation and >50% of this time involved direct face-to-face encounter with the patient for counseling and/or coordination of their care.     Valentina Gu. Roxy Manns, MD 12/02/2015 9:39 AM

## 2015-12-02 NOTE — Patient Instructions (Signed)

## 2015-12-03 ENCOUNTER — Ambulatory Visit (INDEPENDENT_AMBULATORY_CARE_PROVIDER_SITE_OTHER): Payer: Medicare Other | Admitting: Cardiology

## 2015-12-03 ENCOUNTER — Encounter: Payer: Self-pay | Admitting: *Deleted

## 2015-12-03 ENCOUNTER — Encounter: Payer: Self-pay | Admitting: Cardiology

## 2015-12-03 VITALS — BP 134/86 | HR 94 | Ht 60.5 in | Wt 144.0 lb

## 2015-12-03 DIAGNOSIS — I2510 Atherosclerotic heart disease of native coronary artery without angina pectoris: Secondary | ICD-10-CM | POA: Diagnosis not present

## 2015-12-03 DIAGNOSIS — J449 Chronic obstructive pulmonary disease, unspecified: Secondary | ICD-10-CM

## 2015-12-03 DIAGNOSIS — Z954 Presence of other heart-valve replacement: Secondary | ICD-10-CM

## 2015-12-03 DIAGNOSIS — K558 Other vascular disorders of intestine: Secondary | ICD-10-CM

## 2015-12-03 DIAGNOSIS — E782 Mixed hyperlipidemia: Secondary | ICD-10-CM

## 2015-12-03 DIAGNOSIS — Z952 Presence of prosthetic heart valve: Secondary | ICD-10-CM

## 2015-12-03 DIAGNOSIS — K5521 Angiodysplasia of colon with hemorrhage: Secondary | ICD-10-CM

## 2015-12-03 MED ORDER — FUROSEMIDE 20 MG PO TABS: 20.0000 mg | ORAL_TABLET | Freq: Every day | ORAL | 3 refills | Status: DC

## 2015-12-03 NOTE — Patient Instructions (Addendum)
Medication Instructions:  Please start Furosemide 20 mg a day. Continue all other medications as listed.  Testing/Procedures: Your physician has requested that you have a TEE. During a TEE, sound waves are used to create images of your heart. It provides your doctor with information about the size and shape of your heart and how well your heart's chambers and valves are working. In this test, a transducer is attached to the end of a flexible tube that's guided down your throat and into your esophagus (the tube leading from you mouth to your stomach) to get a more detailed image of your heart. You are not awake for the procedure. Please see the instruction sheet given to you today. For further information please visit [email or web address].  Follow-Up: Follow up after your scheduled procedure.  If you need a refill on your cardiac medications before your next appointment, please call your pharmacy.  Thank you for choosing [facility]!!

## 2015-12-03 NOTE — Progress Notes (Signed)
Cardiology Office Note   Date:  12/03/2015   ID:  Bell, Ronald 11-05-1940, MRN [id number]  PCP:  Ann Held, DO  Cardiologist:   Candee Furbish, MD       History of Present Illness: Ronald Bell is a 75 y.o. male who presents for follow-up of severe aortic stenosis status post transcatheter aortic valve replacement using 26 mm Edwards heart valve placed via transapical approach (TAVR). Paroxysmal atrial fibrillation was noted and treated with amiodarone. He was not anticoagulated because of recurrent GI bleeding. Recent right coronary artery PCI and stenting with aspirin and Plavix noted. One week later, he was admitted to the hospital again with melena and hemoglobin was 5.1. He was transfused 5 units of blood. Endoscopic ablation with multiple AVMs noted. He had a left thoracentesis as well for moderate size left pleural effusion. Hemoglobin has remained stable at 11.5. He also saw Dr. [name] of pulmonary medicine. On O2 only at night or walking.   Currently he is only on low-dose aspirin. No appreciable leak on echocardiogram.  He is now off of his amiodarone. He is maintaining normal rhythm. On 02/18/15 he also stopped his fenofibrate. Trigs were 128 last check.  He was admitted from 2/25 through  05/20/15 for COPD flare had to go back to the emergency department on 2/28 , CT angiogram was negative.  He also had 4 separate hospitalizations in May for apparent COPD exacerbations. BNP was 58.  More concerning however is his increased transvalvular gradient and velocity, 26 mm Sapien Valve. His breathing has worsened.    Past Medical History:  Diagnosis Date  . Angiodysplasia of intestine with hemorrhage    large and SB, gastric AVMs.   . Anxiety   . Arthritis    "left shoulder" (10/19/2014)  . Carotid artery occlusion   . Carotid artery stenosis 04/22/2012  . COPD GOLD III with min reversibilty  08/11/2006   Followed in Pulmonary clinic/ [facility]/ [facility] - PFT's 04/28/2013  FEV1 0.88 (40%) with ratio 44 and 14% better p B2 dlco 45 corrects to 83 - Trial of breo 04/28/2013 > improved symptoms  06/09/2013  - spirometry 06/04/2014 FEV1  0.76 (29%) ratio 45      . Coronary artery disease   . GERD 11/30/2008   Qualifier: Diagnosis of  By: Marijean Niemann CMA, Danielle    . GERD (gastroesophageal reflux disease)   . GI bleed 2010   4 units PRBCs  . History of blood transfusion "couple times"   "related to bleeding in colon and esophagus"  . Hx of adenomatous colonic polyps 2012, 2013.   Marland Kitchen Hyperlipidemia   . Hypertension   . Iron deficiency anemia 01/25/2009   Qualifier: Diagnosis of  By: Henrene Pastor MD, Docia Chuck   . Irregular heartbeat   . On home oxygen therapy    "2L at night" (07/23/2015)  . Peripheral vascular disease ([place])   . Pleural plaque with presence of asbestos 03/27/2013   Followed in Pulmonary clinic/ [facility]/ [facility] - F/u CT 09/08/2013 1. Stable extensive calcified pleural plaque formation consistent with asbestos exposure. 2. Multiple pulmonary nodules are unchanged from the CT of 6 months ago. Given risk factors for lung cancer, continued follow up is recommended with chest CT in 6 months> done 04/20/14 no change >repeat in 12 m in tickle file     . Polyp of nasal cavity   . PVD (peripheral vascular disease) ([place]) 10/18/2012  . Rosacea   .  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 Shingles   . Tobacco abuse     Past Surgical History:  Procedure Laterality Date  . CARDIAC CATHETERIZATION  2001; 06/28/2014  . CARDIAC CATHETERIZATION N/A 10/19/2014   Procedure: Coronary Stent Intervention;  Surgeon: Burnell Blanks, MD;  Location: [facility] CV [facility];  Service: Cardiovascular;  Laterality: N/A;  BMS Mid RCA  . COLONOSCOPY  July 2015   Dr. Henrene Pastor  . COLONOSCOPY N/A 08/17/2014   Procedure: COLONOSCOPY;  Surgeon: Jerene Bears,  MD;  Location: [facility] ENDOSCOPY;  Service: Endoscopy;  Laterality: N/A;  . COLONOSCOPY N/A 12/05/2014   Procedure: COLONOSCOPY;  Surgeon: Manus Gunning, MD;  Location: [place];  Service: Gastroenterology;  Laterality: N/A;  . ENTEROSCOPY N/A 12/05/2014   Procedure: ENTEROSCOPY;  Surgeon: Manus Gunning, MD;  Location: [facility] ENDOSCOPY;  Service: Gastroenterology;  Laterality: N/A;  . ESOPHAGOGASTRODUODENOSCOPY  2012   normal  . ESOPHAGOGASTRODUODENOSCOPY N/A 08/17/2014   Procedure: ESOPHAGOGASTRODUODENOSCOPY (EGD);  Surgeon: Jerene Bears, MD;  Location: [facility] ENDOSCOPY;  Service: Endoscopy;  Laterality: N/A;  . ILIAC ARTERY STENT Left 2005   CIA  . KNEE ARTHROSCOPY WITH MEDIAL MENISECTOMY Left 03/08/2014   Procedure: LEFT KNEE SCOPE WITH MEDIAL MENISECTOMY AND CHONDROPLASTY;  Surgeon: Ninetta Lights, MD;  Location: [place];  Service: Orthopedics;  Laterality: Left;  . LEFT AND RIGHT HEART CATHETERIZATION WITH CORONARY ANGIOGRAM N/A 06/28/2014   Procedure: LEFT AND RIGHT HEART CATHETERIZATION WITH CORONARY ANGIOGRAM;  Surgeon: Jerline Pain, MD;  Location: [facility] CATH LAB;  Service: Cardiovascular;  Laterality: N/A;  . RIB PLATING Left 11/20/2014   Procedure: RIB PLATING OF LEFT 8TH RIB;  Surgeon: [name], MD;  Location: [place];  Service: Open Heart Surgery;  Laterality: Left;  . TEE WITHOUT CARDIOVERSION N/A 11/20/2014   Procedure: TRANSESOPHAGEAL ECHOCARDIOGRAM (TEE);  Surgeon: [name], MD;  Location: [place];  Service: Open Heart Surgery;  Laterality: N/A;  . TONSILLECTOMY    . TRANSCATHETER AORTIC VALVE REPLACEMENT, TRANSAPICAL N/A 11/20/2014   Procedure: TRANSCATHETER AORTIC VALVE REPLACEMENT, TRANSAPICAL;  Surgeon: [name], MD;  Location: [place];  Service: Open Heart Surgery;  Laterality: N/A;     Current Outpatient Prescriptions  Medication Sig Dispense Refill  . albuterol (PROVENTIL HFA;VENTOLIN HFA) 108 (90 Base) MCG/ACT inhaler Inhale 2 puffs into the lungs every  6 (six) hours as needed for wheezing or shortness of breath (Plan B).     . ALPRAZolam (XANAX) 0.5 MG tablet Take 0.5 mg by mouth 3 (three) times daily as needed for anxiety.    Marland Kitchen arformoterol (BROVANA) 15 MCG/2ML NEBU Take 2 mLs (15 mcg total) by nebulization 2 (two) times daily. 120 mL 3  . aspirin 81 MG tablet Take 81 mg by mouth every morning.     Marland Kitchen atorvastatin (LIPITOR) 40 MG tablet TAKE ONE TABLET BY MOUTH ONCE DAILY 90 tablet 1  . budesonide (PULMICORT) 0.5 MG/2ML nebulizer solution Take 2 mLs (0.5 mg total) by nebulization 2 (two) times daily. 240 mL 12  . citalopram (CELEXA) 10 MG tablet TAKE ONE TABLET BY MOUTH ONCE DAILY 30 tablet 5  . docusate sodium (COLACE) 100 MG capsule Take 200 mg by mouth daily.     . famotidine (PEPCID) 20 MG tablet Take 20 mg by mouth at bedtime.     . fenofibrate 160 MG tablet Take 160 mg by mouth  daily.    . ferrous sulfate 325 (65 FE) MG EC tablet Take 1 tablet (325 mg total) by mouth 2 (two) times daily. 60 tablet 11  . ipratropium-albuterol (DUONEB) 0.5-2.5 (3) MG/3ML SOLN Take 3 mLs by nebulization every 4 (four) hours as needed (shortness of breath and wheezing plan C).    . metoprolol tartrate (LOPRESSOR) 25 MG tablet Take 0.5 tablets (12.5 mg total) by mouth 2 (two) times daily. 30 tablet 6  . Multiple Vitamins-Minerals (CENTRUM SILVER PO) Take 1 tablet by mouth daily.     . OXYGEN Use 2L at bedtime and as needed for shortness of breath    . pantoprazole (PROTONIX) 40 MG tablet Take 1 tablet (40 mg total) by mouth daily. 90 tablet 1  . predniSONE (DELTASONE) 10 MG tablet Take 10 mg by mouth daily with breakfast.    . sodium chloride (OCEAN) 0.65 % SOLN nasal spray Use as needed for nasal congestion    . temazepam (RESTORIL) 30 MG capsule TAKE ONE CAPSULE BY MOUTH AT BEDTIME AS NEEDED FOR SLEEP 30 capsule 0  . umeclidinium bromide (INCRUSE ELLIPTA) 62.5 MCG/INH AEPB Inhale 1 puff into the lungs daily. 1 each 2  . furosemide (LASIX) 20 MG tablet Take 1  tablet (20 mg total) by mouth daily. 90 tablet 3   No current facility-administered medications for this visit.     Allergies:   Review of patient's allergies indicates no known allergies.    Social History:  The patient  reports that he quit smoking about 7 months ago. His smoking use included Cigarettes and E-cigarettes. He has a 60.00 pack-year smoking history. He has never used smokeless tobacco. He reports that he drinks about 8.4 oz of alcohol per week . He reports that he does not use drugs.   Family History:  The patient's family history includes CAD in his daughter; Colitis in his father; Heart disease in his brother; Hyperlipidemia in his brother; Hypertension in his brother and son; Lung disease in his mother.    ROS:  Please see the history of present illness.   Otherwise, review of systems are positive for none.   All other systems are reviewed and negative.    PHYSICAL EXAM: VS:  BP 134/86   Pulse 94   Ht 5' 0.5" (1.537 m)   Wt 144 lb (65.3 kg)   SpO2 90%   BMI 27.66 kg/m  , BMI Body mass index is 27.66 kg/m. GEN: Well nourished, well developed, in no acute distress  HEENT: normal  Neck: no JVD, carotid bruits, or masses Cardiac: RRR; 2/6 S murmur, rubs, or gallops,no edema  Respiratory:  decreased air movement bilaterally, blunted bases no wheezes  GI: soft, nontender, nondistended, + BS MS: no deformity or atrophy  Skin: warm and dry, no rash Neuro:  Strength and sensation are intact Psych: euthymic mood, full affect   EKG:  None today   Recent Labs: 12/07/2014: Magnesium 1.8 07/30/2015: B Natriuretic Peptide 68.0 08/23/2015: ALT 21; BUN 17; Creatinine, Ser 0.84; Potassium 4.2; Sodium 137 11/05/2015: Hemoglobin 12.8; Platelets 194.0    Lipid Panel    Component Value Date/Time   CHOL 171 08/23/2015 1436   TRIG 147.0 08/23/2015 1436   TRIG 102 02/26/2006 1044   HDL 72.40 08/23/2015 1436   CHOLHDL 2 08/23/2015 1436   VLDL 29.4 08/23/2015 1436   LDLCALC  69 08/23/2015 1436   LDLDIRECT 114.0 05/09/2015 0951      Wt Readings from Last 3 Encounters:  12/03/15 144 lb (65.3 kg)  12/02/15 140 lb (63.5 kg)  09/06/15 134 lb (60.8 kg)      Other studies Reviewed: Additional studies/ records that were reviewed today include: Prior lab work reviewed. Review of the above records demonstrates: As above   ASSESSMENT AND PLAN:  Aortic stenosis  - TAVR 63mm Sapien XT 11/20/14  - Still feeling shortness of breath. Had 4 hospitalizations thought to be secondary to COPD, has improved with the help of Dr. [name]. Thoracentesis. Prior episode of GI bleeding but no recent episodes.  - underlying COPD as well. He feels as though his breathing is worse than prior to surgery.  - He recently had echocardiogram which shows increased transvalvular gradient and velocity which has increased from his postoperative echocardiogram. 3.4 m/s. 30 mmHg. Last October 1.7 m/s, in May 2.6 m/s. We are concerned about possible valvular dysfunction.  - In order to visualize this further, we will perform transesophageal echocardiogram with the assistance of anesthesia given his severe COPD.  - Risks and benefits of procedure including esophageal damage have been discussed. He is willing to proceed. I discussed with Dr. Roxy Manns as well.  COPD  - Dr. [name]  - Home oxygen  CAD   - RCA stenting  - Currently on aspirin only  - GI bleed  GI bleed/AVM  - Aspirin only  - Hemoglobin 13.4 on 01/29/15 up from 11.5  Paroxysmal atrial fibrillation postoperatively  - Off of amiodarone now. Doing well.  Lasix had been discontinued. BNP was 68 in May 2017. I will restart 20 mg once a day.  Hyperlipidemia  - Continuing with atorvastatin 40 mg  - I will continue his fenofibrate, triglycerides were 270 at last check.  - Dr. Etter Sjogren restarted fibrate.     Current medicines are reviewed at length with the patient today.  The patient does not have concerns regarding medicines.  The  following changes have been made: Restarted Lasix 20 mg once a day  Labs/ tests ordered today include: as above  No orders of the defined types were placed in this encounter.    Disposition:   FU with Tonica Brasington post TEE Signed, Candee Furbish, MD  12/03/2015 2:38 PM    [address], [place], [place]  [address] Phone: [phone]; Fax: [phone]

## 2015-12-04 NOTE — Addendum Note (Signed)
Addended by: Jerline Pain on: 12/04/2015 08:35 AM   Modules accepted: Orders, SmartSet

## 2015-12-06 ENCOUNTER — Encounter: Payer: Self-pay | Admitting: Family Medicine

## 2015-12-06 ENCOUNTER — Other Ambulatory Visit: Payer: Self-pay | Admitting: Family Medicine

## 2015-12-06 ENCOUNTER — Ambulatory Visit (INDEPENDENT_AMBULATORY_CARE_PROVIDER_SITE_OTHER): Payer: Medicare Other | Admitting: Family Medicine

## 2015-12-06 VITALS — BP 142/58 | HR 62 | Temp 98.3°F | Resp 18 | Ht 61.0 in | Wt 144.6 lb

## 2015-12-06 DIAGNOSIS — I10 Essential (primary) hypertension: Secondary | ICD-10-CM

## 2015-12-06 DIAGNOSIS — Z23 Encounter for immunization: Secondary | ICD-10-CM | POA: Diagnosis not present

## 2015-12-06 DIAGNOSIS — K219 Gastro-esophageal reflux disease without esophagitis: Secondary | ICD-10-CM

## 2015-12-06 DIAGNOSIS — E785 Hyperlipidemia, unspecified: Secondary | ICD-10-CM | POA: Diagnosis not present

## 2015-12-06 DIAGNOSIS — G4700 Insomnia, unspecified: Secondary | ICD-10-CM | POA: Diagnosis not present

## 2015-12-06 DIAGNOSIS — F411 Generalized anxiety disorder: Secondary | ICD-10-CM | POA: Diagnosis not present

## 2015-12-06 DIAGNOSIS — Z954 Presence of other heart-valve replacement: Secondary | ICD-10-CM

## 2015-12-06 DIAGNOSIS — J441 Chronic obstructive pulmonary disease with (acute) exacerbation: Secondary | ICD-10-CM

## 2015-12-06 DIAGNOSIS — I5032 Chronic diastolic (congestive) heart failure: Secondary | ICD-10-CM

## 2015-12-06 DIAGNOSIS — I2510 Atherosclerotic heart disease of native coronary artery without angina pectoris: Secondary | ICD-10-CM

## 2015-12-06 DIAGNOSIS — Z952 Presence of prosthetic heart valve: Secondary | ICD-10-CM

## 2015-12-06 LAB — CBC WITH DIFFERENTIAL/PLATELET
BASOS ABS: 0 10*3/uL (ref 0.0–0.1)
Basophils Relative: 0.4 % (ref 0.0–3.0)
EOS PCT: 1.4 % (ref 0.0–5.0)
Eosinophils Absolute: 0.2 10*3/uL (ref 0.0–0.7)
HCT: 41 % (ref 39.0–52.0)
HEMOGLOBIN: 13.8 g/dL (ref 13.0–17.0)
Lymphocytes Relative: 5.2 % — ABNORMAL LOW (ref 12.0–46.0)
Lymphs Abs: 0.6 10*3/uL — ABNORMAL LOW (ref 0.7–4.0)
MCHC: 33.5 g/dL (ref 30.0–36.0)
MCV: 91.5 fl (ref 78.0–100.0)
MONO ABS: 0.8 10*3/uL (ref 0.1–1.0)
MONOS PCT: 6.6 % (ref 3.0–12.0)
Neutro Abs: 9.9 10*3/uL — ABNORMAL HIGH (ref 1.4–7.7)
Platelets: 201 10*3/uL (ref 150.0–400.0)
RBC: 4.48 Mil/uL (ref 4.22–5.81)
RDW: 17.2 % — ABNORMAL HIGH (ref 11.5–15.5)
WBC: 11.5 10*3/uL — AB (ref 4.0–10.5)

## 2015-12-06 LAB — COMPREHENSIVE METABOLIC PANEL
ALBUMIN: 4.2 g/dL (ref 3.5–5.2)
ALK PHOS: 31 U/L — AB (ref 39–117)
ALT: 21 U/L (ref 0–53)
AST: 20 U/L (ref 0–37)
BUN: 18 mg/dL (ref 6–23)
CALCIUM: 9.5 mg/dL (ref 8.4–10.5)
CHLORIDE: 103 meq/L (ref 96–112)
CO2: 30 mEq/L (ref 19–32)
Creatinine, Ser: 0.97 mg/dL (ref 0.40–1.50)
GFR: 79.7 mL/min (ref >60.00)
Glucose, Bld: 102 mg/dL — ABNORMAL HIGH (ref 70–99)
POTASSIUM: 4.3 meq/L (ref 3.5–5.1)
SODIUM: 139 meq/L (ref 135–145)
TOTAL PROTEIN: 7 g/dL (ref 6.0–8.3)
Total Bilirubin: 0.4 mg/dL (ref 0.2–1.2)

## 2015-12-06 LAB — LIPID PANEL
CHOLESTEROL: 159 mg/dL (ref 0–200)
HDL: 63.7 mg/dL (ref >39.00)
LDL CALC: 69 mg/dL (ref 0–99)
NonHDL: 94.86
Total CHOL/HDL Ratio: 2
Triglycerides: 128 mg/dL (ref 0.0–149.0)
VLDL: 25.6 mg/dL (ref 0.0–40.0)

## 2015-12-06 MED ORDER — ALPRAZOLAM 0.5 MG PO TABS: 0.5000 mg | ORAL_TABLET | Freq: Three times a day (TID) | ORAL | 2 refills | Status: DC | PRN

## 2015-12-06 MED ORDER — PANTOPRAZOLE SODIUM 40 MG PO TBEC: 40.0000 mg | DELAYED_RELEASE_TABLET | Freq: Every day | ORAL | 3 refills | Status: DC

## 2015-12-06 MED ORDER — TEMAZEPAM 30 MG PO CAPS: 30.0000 mg | ORAL_CAPSULE | Freq: Every evening | ORAL | 1 refills | Status: DC | PRN

## 2015-12-06 NOTE — Progress Notes (Signed)
Patient ID: Ronald Bell, male    DOB: 1940/03/26  Age: 75 y.o. MRN: [id number]    Subjective:  Subjective  HPI Ronald Bell presents for f/u bp , cholesterol and anxiety/ insomnia.  He needs refills He was in the hospital 4x in may beause of copd but he is doing well now.    Review of Systems  Constitutional: Negative for appetite change, diaphoresis, fatigue and unexpected weight change.  Eyes: Negative for pain, redness and visual disturbance.  Respiratory: Negative for cough, chest tightness, shortness of breath and wheezing.   Cardiovascular: Negative for chest pain, palpitations and leg swelling.  Endocrine: Negative for cold intolerance, heat intolerance, polydipsia, polyphagia and polyuria.  Genitourinary: Negative for difficulty urinating, dysuria and frequency.  Neurological: Negative for dizziness, light-headedness, numbness and headaches.    History Past Medical History:  Diagnosis Date  . Angiodysplasia of intestine with hemorrhage    large and SB, gastric AVMs.   . Anxiety   . Arthritis    "left shoulder" (10/19/2014)  . Carotid artery occlusion   . Carotid artery stenosis 04/22/2012  . COPD GOLD III with min reversibilty  08/11/2006   Followed in Pulmonary clinic/ [facility]/ [facility] - PFT's 04/28/2013  FEV1 0.88 (40%) with ratio 44 and 14% better p B2 dlco 45 corrects to 83 - Trial of breo 04/28/2013 > improved symptoms  06/09/2013  - spirometry 06/04/2014 FEV1  0.76 (29%) ratio 45      . Coronary artery disease   . GERD 11/30/2008   Qualifier: Diagnosis of  By: Marijean Niemann CMA, Danielle    . GERD (gastroesophageal reflux disease)   . GI bleed 2010   4 units PRBCs  . History of blood transfusion "couple times"   "related to bleeding in colon and esophagus"  . Hx of adenomatous colonic polyps 2012, 2013.   Marland Kitchen Hyperlipidemia   . Hypertension   . Iron deficiency anemia 01/25/2009   Qualifier: Diagnosis of  By: Henrene Pastor MD, Docia Chuck   . Irregular heartbeat   . On  home oxygen therapy    "2L at night" (07/23/2015)  . Peripheral vascular disease ([place])   . Pleural plaque with presence of asbestos 03/27/2013   Followed in Pulmonary clinic/ [facility]/ [facility] - F/u CT 09/08/2013 1. Stable extensive calcified pleural plaque formation consistent with asbestos exposure. 2. Multiple pulmonary nodules are unchanged from the CT of 6 months ago. Given risk factors for lung cancer, continued follow up is recommended with chest CT in 6 months> done 04/20/14 no change >repeat in 12 m in tickle file     . Polyp of nasal cavity   . PVD (peripheral vascular disease) ([place]) 10/18/2012  . Rosacea   . 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 Shingles   . Tobacco abuse     He has a past surgical history that includes Iliac artery stent (Left, 2005); Esophagogastroduodenoscopy (2012); Colonoscopy (July 2015); Tonsillectomy; Knee arthroscopy with medial menisectomy (Left, 03/08/2014); left and right heart catheterization with coronary angiogram (N/A, 06/28/2014); Esophagogastroduodenoscopy (N/A, 08/17/2014); Colonoscopy (N/A, 08/17/2014); Cardiac catheterization (2001; 06/28/2014); Cardiac catheterization (N/A, 10/19/2014); Transcatheter aortic valve replacement, transapical (N/A, 11/20/2014); TEE without cardioversion (N/A, 11/20/2014); Rib plating (Left, 11/20/2014); Colonoscopy (N/A, 12/05/2014); and enteroscopy (N/A, 12/05/2014).   His family history includes CAD in his daughter; Colitis in his father; Heart disease in his brother; Hyperlipidemia in his brother; Hypertension in his  brother and son; Lung disease in his mother.He reports that he quit smoking about 7 months ago. His smoking use included Cigarettes and E-cigarettes. He has a 60.00 pack-year smoking history. He has never used smokeless tobacco. He reports that he drinks about 8.4 oz of alcohol per week . He reports that he does  not use drugs.  Current Outpatient Prescriptions on File Prior to Visit  Medication Sig Dispense Refill  . aspirin 81 MG tablet Take 81 mg by mouth every morning.     . budesonide (PULMICORT) 0.5 MG/2ML nebulizer solution Take 2 mLs (0.5 mg total) by nebulization 2 (two) times daily. 240 mL 12  . citalopram (CELEXA) 10 MG tablet TAKE ONE TABLET BY MOUTH ONCE DAILY 30 tablet 5  . docusate sodium (COLACE) 100 MG capsule Take 200 mg by mouth daily.     . famotidine (PEPCID) 20 MG tablet Take 20 mg by mouth at bedtime.     . ferrous sulfate 325 (65 FE) MG EC tablet Take 1 tablet (325 mg total) by mouth 2 (two) times daily. 60 tablet 11  . furosemide (LASIX) 20 MG tablet Take 1 tablet (20 mg total) by mouth daily. 90 tablet 3  . ipratropium-albuterol (DUONEB) 0.5-2.5 (3) MG/3ML SOLN Take 3 mLs by nebulization every 4 (four) hours as needed (shortness of breath and wheezing plan C).    . Multiple Vitamins-Minerals (CENTRUM SILVER PO) Take 1 tablet by mouth daily.     . OXYGEN Use 2L at bedtime and as needed for shortness of breath    . predniSONE (DELTASONE) 10 MG tablet Take 10 mg by mouth daily with breakfast.    . sodium chloride (OCEAN) 0.65 % SOLN nasal spray Use as needed for nasal congestion    . umeclidinium bromide (INCRUSE ELLIPTA) 62.5 MCG/INH AEPB Inhale 1 puff into the lungs daily. 1 each 2   No current facility-administered medications on file prior to visit.      Objective:  Objective  Physical Exam  Constitutional: He is oriented to person, place, and time. Vital signs are normal. He appears well-developed and well-nourished. He is sleeping.  HENT:  Head: Normocephalic and atraumatic.  Mouth/Throat: Oropharynx is clear and moist.  Eyes: EOM are normal. Pupils are equal, round, and reactive to light.  Neck: Normal range of motion. Neck supple. No thyromegaly present.  Cardiovascular: Normal rate and regular rhythm.   No murmur heard. Pulmonary/Chest: Effort normal and breath  sounds normal. No respiratory distress. He has no wheezes. He has no rales. He exhibits no tenderness.  Musculoskeletal: He exhibits no edema or tenderness.  Neurological: He is alert and oriented to person, place, and time.  Skin: Skin is warm and dry.  Psychiatric: He has a normal mood and affect. His behavior is normal. Judgment and thought content normal.  Nursing note and vitals reviewed.  BP (!) 142/58 (BP Location: Right Arm, Patient Position: Sitting, Cuff Size: Normal)   Pulse 62   Temp 98.3 F (36.8 C) (Oral)   Resp 18   Ht 5\' 1"  (1.549 m)   Wt 144 lb 9.6 oz (65.6 kg)   SpO2 94%   BMI 27.32 kg/m  Wt Readings from Last 3 Encounters:  12/06/15 144 lb 9.6 oz (65.6 kg)  12/03/15 144 lb (65.3 kg)  12/02/15 140 lb (63.5 kg)     Lab Results  Component Value Date   WBC 11.5 (H) 12/06/2015   HGB 13.8 12/06/2015   HCT 41.0 12/06/2015   PLT  201.0 12/06/2015   GLUCOSE 102 (H) 12/06/2015   CHOL 159 12/06/2015   TRIG 128.0 12/06/2015   HDL 63.70 12/06/2015   LDLDIRECT 114.0 05/09/2015   LDLCALC 69 12/06/2015   ALT 21 12/06/2015   AST 20 12/06/2015   NA 139 12/06/2015   K 4.3 12/06/2015   CL 103 12/06/2015   CREATININE 0.97 12/06/2015   BUN 18 12/06/2015   CO2 30 12/06/2015   TSH 1.206 06/01/2014   PSA 1.65 05/09/2015   INR 1.27 12/03/2014   HGBA1C 6.1 (H) 11/16/2014   MICROALBUR 1.1 03/05/2014    No results found.   Assessment & Plan:  Plan  I have changed Ronald Bell temazepam, ALPRAZolam, fenofibrate, and atorvastatin. I am also having him maintain his Multiple Vitamins-Minerals (CENTRUM SILVER PO), docusate sodium, ferrous sulfate, aspirin, OXYGEN, famotidine, budesonide, sodium chloride, citalopram, umeclidinium bromide, predniSONE, ipratropium-albuterol, furosemide, pantoprazole, metoprolol tartrate, arformoterol, and albuterol.  Meds ordered this encounter  Medications  . temazepam (RESTORIL) 30 MG capsule    Sig: Take 1 capsule (30 mg total) by  mouth at bedtime as needed. for sleep    Dispense:  90 capsule    Refill:  1  . pantoprazole (PROTONIX) 40 MG tablet    Sig: Take 1 tablet (40 mg total) by mouth daily.    Dispense:  90 tablet    Refill:  3  . ALPRAZolam (XANAX) 0.5 MG tablet    Sig: Take 1 tablet (0.5 mg total) by mouth 3 (three) times daily as needed for anxiety.    Dispense:  60 tablet    Refill:  2  . metoprolol tartrate (LOPRESSOR) 25 MG tablet    Sig: Take 0.5 tablets (12.5 mg total) by mouth 2 (two) times daily.    Dispense:  30 tablet    Refill:  6  . fenofibrate 160 MG tablet    Sig: Take 1 tablet (160 mg total) by mouth daily.  Marland Kitchen atorvastatin (LIPITOR) 40 MG tablet    Sig: Take 1 tablet (40 mg total) by mouth daily.    Dispense:  90 tablet    Refill:  1  . arformoterol (BROVANA) 15 MCG/2ML NEBU    Sig: Take 2 mLs (15 mcg total) by nebulization 2 (two) times daily.    Dispense:  120 mL    Refill:  3    Dx J44.9  . albuterol (PROVENTIL HFA;VENTOLIN HFA) 108 (90 Base) MCG/ACT inhaler    Sig: Inhale 2 puffs into the lungs every 6 (six) hours as needed for wheezing or shortness of breath (Plan B).    Problem List Items Addressed This Visit      Unprioritized   Hyperlipidemia   Relevant Medications   metoprolol tartrate (LOPRESSOR) 25 MG tablet   fenofibrate 160 MG tablet   atorvastatin (LIPITOR) 40 MG tablet   Other Relevant Orders   CBC with Differential/Platelet (Completed)   Lipid panel (Completed)   Comprehensive metabolic panel (Completed)   COPD exacerbation (HCC)   Relevant Medications   arformoterol (BROVANA) 15 MCG/2ML NEBU   albuterol (PROVENTIL HFA;VENTOLIN HFA) 108 (90 Base) MCG/ACT inhaler   Essential hypertension - Primary (Chronic)   Relevant Medications   metoprolol tartrate (LOPRESSOR) 25 MG tablet   fenofibrate 160 MG tablet   atorvastatin (LIPITOR) 40 MG tablet   Other Relevant Orders   Lipid panel (Completed)   Comprehensive metabolic panel (Completed)   GERD   Relevant  Medications   pantoprazole (PROTONIX) 40 MG tablet   Chronic diastolic  CHF (congestive heart failure) (HCC)    Per  pulm      Relevant Medications   metoprolol tartrate (LOPRESSOR) 25 MG tablet   fenofibrate 160 MG tablet   atorvastatin (LIPITOR) 40 MG tablet   S/P aortic valve replacement    TEE next week       Other Visit Diagnoses    Insomnia       Relevant Medications   temazepam (RESTORIL) 30 MG capsule   Generalized anxiety disorder       Relevant Medications   ALPRAZolam (XANAX) 0.5 MG tablet   Encounter for immunization       Relevant Medications   temazepam (RESTORIL) 30 MG capsule   pantoprazole (PROTONIX) 40 MG tablet   ALPRAZolam (XANAX) 0.5 MG tablet   Other Relevant Orders   CBC with Differential/Platelet (Completed)   Lipid panel (Completed)   Comprehensive metabolic panel (Completed)   Flu vaccine HIGH DOSE PF (Completed)      Follow-up: Return in about 6 months (around 06/04/2016) for hypertension, hyperlipidemia.  Ann Held, DO

## 2015-12-06 NOTE — Progress Notes (Signed)
Pre visit review using our clinic review tool, if applicable. No additional management support is needed unless otherwise documented below in the visit note. 

## 2015-12-06 NOTE — Patient Instructions (Signed)

## 2015-12-08 MED ORDER — ATORVASTATIN CALCIUM 40 MG PO TABS: 40.0000 mg | ORAL_TABLET | Freq: Every day | ORAL | 1 refills | Status: DC

## 2015-12-08 MED ORDER — FENOFIBRATE 160 MG PO TABS: 160.0000 mg | ORAL_TABLET | Freq: Every day | ORAL | Status: DC

## 2015-12-08 MED ORDER — METOPROLOL TARTRATE 25 MG PO TABS
12.5000 mg | ORAL_TABLET | Freq: Two times a day (BID) | ORAL | 6 refills | Status: DC
Start: 2015-12-08 — End: 2016-02-03

## 2015-12-08 MED ORDER — ALBUTEROL SULFATE HFA 108 (90 BASE) MCG/ACT IN AERS: 2.0000 | INHALATION_SPRAY | Freq: Four times a day (QID) | RESPIRATORY_TRACT | Status: DC | PRN

## 2015-12-08 MED ORDER — ARFORMOTEROL TARTRATE 15 MCG/2ML IN NEBU: 15.0000 ug | INHALATION_SOLUTION | Freq: Two times a day (BID) | RESPIRATORY_TRACT | 3 refills | Status: DC

## 2015-12-08 NOTE — Assessment & Plan Note (Signed)
Per pulm 

## 2015-12-08 NOTE — Assessment & Plan Note (Signed)
TEE next week

## 2015-12-09 ENCOUNTER — Encounter: Payer: Self-pay | Admitting: Internal Medicine

## 2015-12-09 ENCOUNTER — Ambulatory Visit (INDEPENDENT_AMBULATORY_CARE_PROVIDER_SITE_OTHER): Payer: Medicare Other | Admitting: Internal Medicine

## 2015-12-09 ENCOUNTER — Ambulatory Visit (INDEPENDENT_AMBULATORY_CARE_PROVIDER_SITE_OTHER)
Admission: RE | Admit: 2015-12-09 | Discharge: 2015-12-09 | Disposition: A | Discharge status: Home / Self Care | Payer: Medicare Other | Source: Ambulatory Visit | Attending: Internal Medicine | Admitting: Internal Medicine

## 2015-12-09 ENCOUNTER — Telehealth: Payer: Self-pay | Admitting: Family Medicine

## 2015-12-09 ENCOUNTER — Telehealth: Payer: Self-pay

## 2015-12-09 VITALS — BP 110/60 | HR 74 | Ht 62.0 in | Wt 141.4 lb

## 2015-12-09 DIAGNOSIS — I2510 Atherosclerotic heart disease of native coronary artery without angina pectoris: Secondary | ICD-10-CM | POA: Diagnosis not present

## 2015-12-09 DIAGNOSIS — J9611 Chronic respiratory failure with hypoxia: Secondary | ICD-10-CM | POA: Diagnosis not present

## 2015-12-09 DIAGNOSIS — J449 Chronic obstructive pulmonary disease, unspecified: Secondary | ICD-10-CM

## 2015-12-09 DIAGNOSIS — J9612 Chronic respiratory failure with hypercapnia: Secondary | ICD-10-CM | POA: Diagnosis not present

## 2015-12-09 DIAGNOSIS — R0602 Shortness of breath: Secondary | ICD-10-CM | POA: Diagnosis not present

## 2015-12-09 DIAGNOSIS — J441 Chronic obstructive pulmonary disease with (acute) exacerbation: Secondary | ICD-10-CM

## 2015-12-09 MED ORDER — TEMAZEPAM 15 MG PO CAPS: 30.0000 mg | ORAL_CAPSULE | Freq: Every evening | ORAL | 0 refills | Status: DC | PRN

## 2015-12-09 NOTE — Telephone Encounter (Signed)
restoril and temazapam are the same---  Let pt know his ins will pay for 15mg  , not 30 mg and we will send in 15 mg

## 2015-12-09 NOTE — Telephone Encounter (Signed)
PA initiated for the Restoril 30 mg capsule 1 po qhs #90 for a 90 day supply. The medication is not on the formulary, alternatives are Silenor  mg and  6 mg, Temazepam 7.5 mg and 15 mg, Trazodone Hcl 50 mg, 100 mg or 150 mg. Please review and advise    KP

## 2015-12-09 NOTE — Patient Instructions (Signed)
Please remember to go to the x-ray department downstairs for your tests - we will call you with the results when they are available.  See calendar for specific medication instructions and bring it back for each and every office visit for every healthcare provider you see.  Without it,  you may not receive the best quality medical care that we feel you deserve.  You will note that the calendar groups together  your maintenance  medications that are timed at particular times of the day.  Think of this as your checklist for what your doctor has instructed you to do until your next evaluation to see what benefit  there is  to staying on a consistent group of medications intended to keep you well.  The other group at the bottom is entirely up to you to use as you see fit  for specific symptoms that may arise between visits that require you to treat them on an as needed basis.  Think of this as your action plan or "what if" list.   Separating the top medications from the bottom group is fundamental to providing you adequate care going forward.

## 2015-12-09 NOTE — Telephone Encounter (Signed)
I am aware that they are the same, I'm giving you what they gave me.     KP

## 2015-12-09 NOTE — Telephone Encounter (Signed)
Patient wants to do 2 of the 15 mg of the Temazepam for a quantity of 60. He also wants you to fax the script for the quantity of 90 for his xanax. Please advise    KP

## 2015-12-09 NOTE — Telephone Encounter (Signed)
Pt called in. He says that he is suppose to have 90 tablets ALPRAZolam. He says that he only received 60. Pt would like to have he remainder of the Rx sent to pharmacy if possible.

## 2015-12-09 NOTE — Telephone Encounter (Signed)
Ok to send the 30---- but it may be less expensive to wait until he needs a new rx and just send in the 90 unless he has not filled rx yet

## 2015-12-09 NOTE — Assessment & Plan Note (Signed)
HC03  12/17/14  32  01/10/2015   Walked RA  2 laps @ 185 ft each stopped due to  Sob with sat 89% at nl pace - 05/24/2015  Walked 2lpm x 3 laps @ 185 ft each stopped due to  End of study, nl pace, no  desat  Min sob  - HCO3  12/06/15  = 30   rec as of 12/09/2015  02 2lpm  Activity and hs

## 2015-12-09 NOTE — Telephone Encounter (Signed)
We can try #60 ---- they may have a quantity limit of #30  Ok to send #90 xanax when it is due

## 2015-12-09 NOTE — Assessment & Plan Note (Signed)
-   PFT's 04/28/2013  FEV1 0.88 (40%) with ratio 44 and 14% better p B2 dlco 45 corrects to 83 - Trial of breo 04/28/2013 > improved symptoms  06/09/2013  - spirometry 06/04/2014 FEV1  0.76 (29%) ratio 45  -01/10/2015   trial of stiolto > insurance declined  - PFT's  02/22/2015  FEV1 0.84 (40 % ) ratio 41  p 5 % improvement from saba with DLCO  40 % corrects to 51 % for alv volume   - 02/22/15  rec rehab/ declined - 05/24/2015  extensive coaching HFA effectiveness =    75% > try change anoro to BEVESPI  - multilple admits > tried on daliresp > no better so 08/23/2015 rec d/c when present bottle used up   - 08/23/2015 pred rx daily 20 if doing poor/ 10 if doing better  -med calendar 09/06/2015   - 12/09/2015  After extensive coaching HFA effectiveness =    75% (short Ti)   DDX of  difficult airways management almost all start with A and  include Adherence, Ace Inhibitors, Acid Reflux, Active Sinus Disease, Alpha 1 Antitripsin deficiency, Anxiety masquerading as Airways dz,  ABPA,  Allergy(esp in young), Aspiration (esp in elderly), Adverse effects of meds,  Active smokers, A bunch of PE's (a small clot burden can't cause this syndrome unless there is already severe underlying pulm or vascular dz with poor reserve) plus two Bs  = Bronchiectasis and Beta blocker use..and one C= CHF  Adherence is always the initial "prime suspect" and is a multilayered concern that requires a "trust but verify" approach in every patient - starting with knowing how to use medications, especially inhalers, correctly, keeping up with refills and understanding the fundamental difference between maintenance and prns vs those medications only taken for a very short course and then stopped and not refilled.  - needs to follow med calendar to the letter both maint and prns   ? Acid (or non-acid) GERD > always difficult to exclude as up to 75% of pts in some series report no assoc GI/ Heartburn symptoms> rec continue max (24h)  acid suppression  and diet restrictions/ reviewed     ? Allergy/asthma component/ The goal with a chronic steroid dependent illness is always arriving at the lowest effective dose that controls the disease/symptoms and not accepting a set "formula" which is based on statistics or guidelines that don't always take into account patient  variability or the natural hx of the dz in every individual patient, which may well vary over time.  For now therefore I recommend the patient maintain  A min of 10 mg prednisone per day as having some breakthru wheezing   ? CHF > TEE pending /f/u cards/ CT surgery    I had an extended discussion with the patient reviewing all relevant studies completed to date and  lasting 15 to 20 minutes of a 25 minute visit    Each maintenance medication was reviewed in detail including most importantly the difference between maintenance and prns and under what circumstances the prns are to be triggered using an action plan format that is not reflected in the computer generated alphabetically organized AVS but trather by a customized med calendar that reflects the AVS meds with confirmed 100% correlation.   Please see instructions for details which were reviewed in writing and the patient given a copy highlighting the part that I personally wrote and discussed at today's ov.

## 2015-12-09 NOTE — Progress Notes (Signed)
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 acei cough   History of Present Illness  03/27/2013 1st [facility] office visit/ [name] still active smoker cc recurrent episodes of severe refractory cough this episode started x 3 weeks prior to OV   but comes and goes since early fall 2014 assoc with doe x  walking garbage to street x 3 years better up to several hours after combivent on tudorza bid miant as well.  Cough is variably prod of thick yellow mucus but min amt, mostly prod in am and dry rest of the day.  Has noct choking spells just about every night p lies down better if sits up and takes combivent - they feel almost  like something strangling him.  rec Stop lisinopril Benicar 20/12.5 one daily  Pantoprazole (protonix) 40 mg   Take 30-60 min before first meal of the day and Pepcid ac 20 mg one bedtime until return to office   The key is to stop smoking completely before smoking completely stops you!    04/04/2013  Acute  ov/Kimberlly Norgard re: refractory cough  Chief Complaint  Patient presents with  . Acute Visit    Pt c/o increased cough since last visit. Cough is non prod and worse at night and early am. He states that he gets SOB with coughing spells and is using combivent 5 times daily. He states had episode of syncope last night and EMS was called.    really not sob unless coughing. No excess mucus, present 24/7  rec Stop tudorza Prednisone 10 mg take  4 each am x 2 days,   2 each am x 2 days,  1 each am x 2 days and stop  mucinex dm 1200 twice daily as long as you feel the urge to cough and supplement it with Tramadol 50 up to 2 to every 4 hours- once the cough is gone back off of it. Prednisone 10 mg take  4 each  am x 2 days,   2 each am x 2 days,  1 each am x 2 days and stop  Pantoprazole (protonix) 40 mg   Take 30-60 min before first meal of the day and Pepcid 20 mg one bedtime until return to office - this is the best way to tell whether stomach acid is contributing to your problem.   GERD diet   04/28/2013 f/u ov/Nike Southwell re: GOLD III COPD / cough much better off ACEi Chief Complaint  Patient presents with  . Follow-up    Pt states breathing is slightly improved and cough is much improved. He is using combivent approx 4 times per day.   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 160 - 12.5 one daily  Ok to stop pepcid to see what effect this has on your cough and if worse restart, if no change in symptoms x sev weeks then  stop protonix too to see if really need it Start breo open it once  each am (x 2 drags to be sure you get it  all) Only use your combivent as needed       Admit date: 04/16/2014 Discharge date: 04/17/2014   Discharge Diagnoses:    COPD exacerbation   Acute respiratory failure  Hyperlipidemia  Essential hypertension   06/04/2014 f/u ov/Brodee Mauritz re: GOLD III / severe AS/ stopped smoking around 04/23/14  Chief Complaint  Patient presents with  . Acute Visit    Pt c/o increased SOB off and on since Feb 2016. He states that he wakes up in the night feeling out of breath and gets SOB walking from room to room at home. He is using combivent 5-6 x per day.    sleeping propped up now using noct combivent as well as q am breo   Cancelled ov with Dr [name] today "I wanted to get my breathing right first" rec Prednisone 10 mg take  4 each am x 2 days,   2 each am x 2 days,  1 each am x 2 days and stop  Add pepcid 20 mg at bedtime (over the counter) Prop up as much as possible at bedtime  No change Breo each am combivent up to 1 puff every 6 hours as needed  Keep appt with T  surgery    Last T surgery note  12/24/14  Patient returns for routine follow-up up approximately one month status post transcatheter aortic valve replacement using a 26 mm Edwards Sapien XT transcatheter heart valve placed via trans-apical approach on 11/20/2014. His early postoperative recovery in the hospital was notable for paroxysmal atrial fibrillation for which he was treated with amiodarone. He was discharged from the hospital on the seventh postoperative day in sinus rhythm. Because of his history of recurrent GI bleeding in the past he was not anticoagulated using warfarin. Dual antiplatelets therapy using aspirin and Plavix was resumed because of recent PCI and stenting of the right coronary artery. He was readmitted to the hospital one week later with melena and symptomatic anemia with hemoglobin 5.1. He was transfused a total of 5 units packed red blood cells and underwent both upper and lower endoscopy with ablation of multiple AVMs. During his hospitalization he also underwent left thoracentesis for a moderate sized left pleural effusion. He was eventually discharged home and he returns to our office today for routine follow-up. He was seen in follow-up earlier today by Dr. Henrene Pastor. He has not had any further signs of GI bleeding and his hemoglobin has remained stable 11.5. The patient states that he is still limited by exertional shortness of breath that is multifactorial and related to his underlying COPD and chronic diastolic congestive heart failure. He has not had any chest pain or chest tightness. Appetite is good. Home health physical therapy has been working with him and he has been making slow but steady progress. He does not wish to participate in outpatient cardiac rehabilitation program.    01/10/2015  Ext f/u  ov/Vonda Harth re:  Re-establish care post op COPD GOLD III on Breo and prn combivent  Chief Complaint  Patient presents with  . Follow-up    COPD f/u pt. states breathing has  wrosen. SOB with activity. no wheezing. no cough. no chest pain/tightness. on 3L O2 pulse.   Can walk up and back driveway RA  2 x then checks 02 sats 88-89% then walks 2 more  Times was easier before surgery and not on 02 at that point  but still on BREO Finished amio one week prior to OV   Wearing 02 hs  3lpm  rec Stop breo and  combivent start stiolto 2 pffs each am  Only use your albuterol (proair) as a rescue medication 02  2lpm sleeping and walking  No need for 02 inside the house with activties during the day  unless excercising  Prednisone 10 mg take  4 each am x 2 days,   2 each am x 2 days,  1 each am x 2 days and stop  Please schedule a follow up office visit in 6 weeks, call sooner if needed with pfts    02/22/2015  f/u ov/Markas Aldredge re: GOLD III copd/ still smoking on anoro / alb 2 puffs around lunch time  Chief Complaint  Patient presents with  . Follow-up    pt following fore COPD with PFT today: pt c/o the regular SOB, and dry cough at times. no c/o wheezing or chest tightness.   walking up to 4 min back and forth to mb on 2lpm   rec Continue Anoro one click each am   Only use your albuterol (proair) as a rescue medication  02  2lpm sleeping and walking  No need for 02 inside the house with activties during the day  unless excercising   Admit date: 05/18/2015 Discharge date: 05/20/2015  Discharge Diagnoses:   COPD exacerbation  Hyperlipidemia  Iron deficiency anemia  Essential hypertension  GERD  Aortic stenosis, severe  Former smoker  S/P TAVR (transcatheter aortic valve replacement)  CAD (coronary artery disease)  Depression  Acute on chronic respiratory failure (HCC)  Elevated lactic acid level   05/24/2015  Extended transition of care  ov/Dakotah Orrego re: COPD  GOLD III criteria/ 02 rx 2lpm with activity and hs   Chief Complaint  Patient presents with  . Follow-up    Recently admitted to hospital from 05/18/15-03/18/16 with COPD flare. He states had to to  back to ED on 05/21/15 with SOB and had neg CT Angio. He states that his breathing seems better today and breathing is back at baseline.   on ppi pc and anoro plus freq saba in multiple forms  rec Plan A = Automatic = stop Anoro and try BEVESPI  Take 2 puffs first thing in am and then another 2 puffs about 12 hours later.                                     Change protonix to 40mg  Take 30-60 min before first meal of the day and add Pepcid ac 20 mg at bedtime  Plan B = Backup Only use your albuterol as a rescue medication Plan C = Crisis - only use your albuterol nebulizer if you first try Plan B and it fails to help > ok to use the nebulizer up to every 4 hours but if start needing it regularly call for immediate appointment Plan D = Doctor - call me if B and C not adequate Plan E = ER - go to ER or call 911 if all else fails       08/23/2015  Post hosp/ transition of care  ov/Jyla Hopf re: COPD III/ 02 dep/ freq admits Chief Complaint  Patient presents with  . HFU    Breathing has wornsened just over the past 3 days.   doe = MMRC3 = can't walk 100 yards even at a slow pace at a flat grade s stopping due to sob even on 2lpm  Last used saba  am of ov/ very confused with details of care/ sob worse since finished last pred rx   >>Continue on Brovana and budesonide nebulizer twice daily, Incruse    09/06/2015 NP  Follow up : COPD III /O2 dep  Patient returns for a two-week follow-up. Since last visit. Patient says he is doing well Recent exacerbation , COPD with hospitalization. Patient feels that he is starting to get stronger. Feels he is getting stronger, has not used duoneb or ventolin much at all.  Denies chest pain, orthopnea, edema or hemoptyis s.  We reviewed all his medications organize them into a medication calendar  with patient education. Says he cannot afford daliresp. Seems to know his med well .  Has a weekly pill box with daily sections.   rec Follow med calendar closely and  bring to each visit Follow up with Dr. [name] in 3 months and as needed     12/09/2015  f/u ov/Layney Gillson re: copd III/ 02 hs only/ maint brov/bud/incruse and pred 10 mg daily  Chief Complaint  Patient presents with  . Follow-up    Pt c/o increased SOB and wheezing x 3 days. He is using albuterol daily.   walks mailbox and back s stopping > 50 ft no incline involved / rarely does a  store and when does he puts 02 in basket same speed as other ok at [employer] but not [employer]  Superstore - using incruse first thing in am instead of bud/brovana Started back on daily lasix x one week no better sob   No obvious day to day or daytime variability or assoc excess/ purulent sputum or mucus plugs or hemoptysis or cp or chest tightness, overt sinus or hb symptoms. No unusual exp hx or h/o childhood pna/ asthma or knowledge of premature birth.  Sleeping ok without nocturnal  or early am exacerbation  of respiratory  c/o's or need for noct saba. Also denies any obvious fluctuation of symptoms with weather or environmental changes or other aggravating or alleviating factors except as outlined above   Current Medications, Allergies, Complete Past Medical History, Past Surgical History, Family History, and Social History were reviewed in [employer] record.  ROS  The following are not active complaints unless bolded sore throat, dysphagia, dental problems, itching, sneezing,  nasal congestion or excess/ purulent secretions, ear ache,   fever, chills, sweats, unintended wt loss, classically pleuritic or exertional cp,  orthopnea pnd or leg swelling, presyncope, palpitations, abdominal pain, anorexia, nausea, vomiting, diarrhea  or change in bowel or bladder habits, change in stools or urine, dysuria,hematuria,  rash, arthralgias, visual complaints, headache, numbness, weakness or ataxia or problems with walking or coordination,  change in mood/affect or memory.                    Objective:    Physical Exam    amb wm nad    / vital signs reviewed / note sats 93% on arrival on RA   06/04/2014        137 > 01/10/2015  136 > 02/22/2015  138 > 05/24/2015  135 > 08/23/2015    134  > 12/09/2015  141     HEENT mild turbinate edema.  Oropharynx no thrush or excess pnd or cobblestoning.  No JVD or cervical adenopathy. Mild accessory muscle hypertrophy. Trachea midline, nl thryroid. Chest was hyperinflated by percussion with diminished breath sounds and moderate increased exp time no wheeze at all   Hoover sign positive at  mid inspiration. Regular rate and rhythm with  no  gallop or rub or increase P2 or edema.  Abd: no hsm, nl excursion. Ext warm without cyanosis or clubbing.    CXR PA and Lateral:   12/09/2015 :    I personally reviewed images and agree with radiology impression as follows:   Persistent evidence of prior asbestos exposure. Scarring anterior left mid lung. No frank edema or consolidation. Status post aortic valve replacement. Aortic atherosclerosis noted.

## 2015-12-09 NOTE — Telephone Encounter (Signed)
Please advise      KP 

## 2015-12-10 NOTE — Telephone Encounter (Signed)
Patient would like to speak with you regarding medication below; quantity. Please advise best # [phone]

## 2015-12-10 NOTE — Telephone Encounter (Signed)
Discussed with the patient and he verbalized understanding, he will call when he is getting low for a full 90 day Rx of the Xanax.     KP

## 2015-12-10 NOTE — Telephone Encounter (Signed)
Temazepam has been faxed and the patient is aware.    KP

## 2015-12-10 NOTE — Progress Notes (Signed)
Spoke with pt and notified of results per Dr. Wert. Pt verbalized understanding and denied any questions. 

## 2015-12-19 ENCOUNTER — Encounter (HOSPITAL_COMMUNITY): Payer: Self-pay

## 2015-12-19 ENCOUNTER — Ambulatory Visit (HOSPITAL_COMMUNITY): Payer: Medicare Other | Admitting: Anesthesiology

## 2015-12-19 ENCOUNTER — Ambulatory Visit (HOSPITAL_COMMUNITY)
Admission: RE | Admit: 2015-12-19 | Discharge: 2015-12-19 | Disposition: A | Discharge status: Home / Self Care | Payer: Medicare Other | Source: Ambulatory Visit | Attending: Cardiology | Admitting: Cardiology

## 2015-12-19 ENCOUNTER — Encounter (HOSPITAL_COMMUNITY): Payer: Self-pay | Admitting: Anesthesiology

## 2015-12-19 ENCOUNTER — Ambulatory Visit (HOSPITAL_BASED_OUTPATIENT_CLINIC_OR_DEPARTMENT_OTHER)
Admission: RE | Admit: 2015-12-19 | Discharge: 2015-12-19 | Disposition: A | Discharge status: Home / Self Care | Payer: Medicare Other | Source: Ambulatory Visit | Attending: Cardiology | Admitting: Cardiology

## 2015-12-19 ENCOUNTER — Encounter (HOSPITAL_COMMUNITY)
Admission: RE | Disposition: A | Discharge status: Home / Self Care | Payer: Self-pay | Source: Ambulatory Visit | Attending: Cardiology

## 2015-12-19 DIAGNOSIS — Z7951 Long term (current) use of inhaled steroids: Secondary | ICD-10-CM | POA: Insufficient documentation

## 2015-12-19 DIAGNOSIS — Z952 Presence of prosthetic heart valve: Secondary | ICD-10-CM | POA: Diagnosis not present

## 2015-12-19 DIAGNOSIS — I10 Essential (primary) hypertension: Secondary | ICD-10-CM | POA: Diagnosis not present

## 2015-12-19 DIAGNOSIS — I350 Nonrheumatic aortic (valve) stenosis: Secondary | ICD-10-CM | POA: Diagnosis not present

## 2015-12-19 DIAGNOSIS — J449 Chronic obstructive pulmonary disease, unspecified: Secondary | ICD-10-CM | POA: Diagnosis not present

## 2015-12-19 DIAGNOSIS — I509 Heart failure, unspecified: Secondary | ICD-10-CM | POA: Insufficient documentation

## 2015-12-19 DIAGNOSIS — Z79899 Other long term (current) drug therapy: Secondary | ICD-10-CM | POA: Diagnosis not present

## 2015-12-19 DIAGNOSIS — Z87891 Personal history of nicotine dependence: Secondary | ICD-10-CM | POA: Diagnosis not present

## 2015-12-19 DIAGNOSIS — I110 Hypertensive heart disease with heart failure: Secondary | ICD-10-CM | POA: Insufficient documentation

## 2015-12-19 DIAGNOSIS — Z7952 Long term (current) use of systemic steroids: Secondary | ICD-10-CM | POA: Insufficient documentation

## 2015-12-19 DIAGNOSIS — Z7982 Long term (current) use of aspirin: Secondary | ICD-10-CM | POA: Insufficient documentation

## 2015-12-19 DIAGNOSIS — Z09 Encounter for follow-up examination after completed treatment for conditions other than malignant neoplasm: Secondary | ICD-10-CM | POA: Insufficient documentation

## 2015-12-19 DIAGNOSIS — I2510 Atherosclerotic heart disease of native coronary artery without angina pectoris: Secondary | ICD-10-CM | POA: Diagnosis not present

## 2015-12-19 DIAGNOSIS — E785 Hyperlipidemia, unspecified: Secondary | ICD-10-CM | POA: Diagnosis not present

## 2015-12-19 DIAGNOSIS — D509 Iron deficiency anemia, unspecified: Secondary | ICD-10-CM | POA: Diagnosis not present

## 2015-12-19 DIAGNOSIS — I359 Nonrheumatic aortic valve disorder, unspecified: Secondary | ICD-10-CM | POA: Diagnosis not present

## 2015-12-19 HISTORY — PX: TEE WITHOUT CARDIOVERSION: SHX5443

## 2015-12-19 SURGERY — ECHOCARDIOGRAM, TRANSESOPHAGEAL
Anesthesia: Monitor Anesthesia Care

## 2015-12-19 MED ORDER — LIDOCAINE 2% (20 MG/ML) 5 ML SYRINGE
INTRAMUSCULAR | Status: DC | PRN
  Administered 2015-12-19: 60 mg via INTRAVENOUS

## 2015-12-19 MED ORDER — BUTAMBEN-TETRACAINE-BENZOCAINE 2-2-14 % EX AERO
INHALATION_SPRAY | CUTANEOUS | Status: DC | PRN
  Administered 2015-12-19: 2 via TOPICAL

## 2015-12-19 MED ORDER — MIDAZOLAM HCL 5 MG/ML IJ SOLN
INTRAMUSCULAR | Status: AC
  Filled 2015-12-19: qty 2

## 2015-12-19 MED ORDER — DIPHENHYDRAMINE HCL 50 MG/ML IJ SOLN
INTRAMUSCULAR | Status: AC
  Filled 2015-12-19: qty 1

## 2015-12-19 MED ORDER — FENTANYL CITRATE (PF) 100 MCG/2ML IJ SOLN
INTRAMUSCULAR | Status: AC
  Filled 2015-12-19: qty 2

## 2015-12-19 MED ORDER — PROPOFOL 10 MG/ML IV BOLUS
INTRAVENOUS | Status: DC | PRN
  Administered 2015-12-19 (×3): 20 mg via INTRAVENOUS
  Administered 2015-12-19 (×2): 30 mg via INTRAVENOUS
  Administered 2015-12-19: 20 mg via INTRAVENOUS

## 2015-12-19 MED ORDER — SODIUM CHLORIDE 0.9 % IV SOLN
INTRAVENOUS | Status: DC
  Administered 2015-12-19: 11:00:00 via INTRAVENOUS
  Administered 2015-12-19: 500 mL via INTRAVENOUS

## 2015-12-19 NOTE — Transfer of Care (Signed)
Immediate Anesthesia Transfer of Care Note  Patient: OLAJIDE BARGERSTOCK  Procedure(s) Performed: Procedure(s): TRANSESOPHAGEAL ECHOCARDIOGRAM (TEE) (N/A)  Patient Location: PACU and Endoscopy Unit  Anesthesia Type:MAC  Level of Consciousness: awake, alert , oriented and patient cooperative  Airway & Oxygen Therapy: Patient Spontanous Breathing and Patient connected to nasal cannula oxygen  Post-op Assessment: Report given to RN, Post -op Vital signs reviewed and stable and Patient moving all extremities  Post vital signs: Reviewed and stable  Last Vitals:  Vitals:   12/19/15 1200 12/19/15 1210  BP: (!) 104/51 (!) 156/65  Pulse: 83 75  Resp: 18 11  Temp:      Last Pain:  Vitals:   12/19/15 1143  TempSrc: Oral         Complications: No apparent anesthesia complications

## 2015-12-19 NOTE — H&P (View-Only) (Signed)
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  He was last seen in our office on 12/24/2014 at which time he was doing fairly well. He has been followed regularly by Drs. Skains who saw him most recently on 05/27/2015. Over the past 6 months the patient has had problems with worsening shortness of breath including multiple hospitalizations for acute exacerbations of COPD. He tells me he was hospitalized 4 times in the month of May. Since then he has been followed carefully by Dr. [name] who has been adjusting his medical therapy. The patient has remained stable since then but he remains severely limited by exertional shortness of breath. He gets short of breath with very mild activity and occasionally at rest. He is more short of breath if he lays flat in bed and he sleeps on 2 pillows. He denies any history of PND or lower extremity edema. He has never had any chest pain or chest tightness. He has not been smoking. He has not had any further episodes of GI bleeding.   Current Outpatient Prescriptions  Medication Sig Dispense Refill  . albuterol (PROVENTIL HFA;VENTOLIN HFA) 108 (90 Base) MCG/ACT inhaler Inhale 2 puffs into the lungs every 4 (four) hours as needed for wheezing or shortness of breath. (Patient taking differently: Inhale 2 puffs into the lungs every 4 (four) hours as needed for wheezing or shortness of breath ((PLAN B))). ) 1 Inhaler 11  . ALPRAZolam (XANAX) 0.5 MG tablet Take 0.5 mg by mouth 3 (three)  times daily as needed for anxiety.    . ALPRAZolam (XANAX) 0.5 MG tablet TAKE ONE TABLET BY MOUTH THREE TIMES DAILY AS NEEDED FOR ANXIETY 90 tablet 0  . ALPRAZolam (XANAX) 0.5 MG tablet TAKE ONE TABLET BY MOUTH THREE TIMES DAILY AS NEEDED 90 tablet 0  . arformoterol (BROVANA) 15 MCG/2ML NEBU Take 2 mLs (15 mcg total) by nebulization 2 (two) times daily. 120 mL 3  . aspirin 81 MG tablet Take 81 mg by mouth every morning.     Marland Kitchen atorvastatin (LIPITOR) 40 MG tablet TAKE ONE TABLET BY MOUTH ONCE DAILY 90 tablet 1  . budesonide (PULMICORT) 0.5 MG/2ML nebulizer solution Take 2 mLs (0.5 mg total) by nebulization 2 (two) times daily. 240 mL 12  . citalopram (CELEXA) 10 MG tablet TAKE ONE TABLET BY MOUTH ONCE DAILY 30 tablet 5  . docusate sodium (COLACE) 100 MG capsule Take 200 mg by mouth daily.     . famotidine (PEPCID) 20 MG tablet Take 20 mg by mouth at bedtime.     . fenofibrate 160 MG tablet TAKE ONE TABLET BY MOUTH ONCE DAILY (Patient taking differently: TAKE ONE TABLET BY MOUTH EVERY MORNING) 30 tablet 5  . ferrous sulfate 325 (65 FE) MG EC tablet Take 1 tablet (325 mg total) by mouth 2 (two) times daily. 60 tablet 11  . ipratropium-albuterol (DUONEB) 0.5-2.5 (3) MG/3ML SOLN Take 3 mLs by nebulization every 4 (four) hours as needed. Take Nebs three times a day for this week, after  that continue as needed for shortness of breath or wheezing (Patient taking differently: Take 3 mLs by nebulization every 4 (four) hours as needed (SHORTNESS OF BREATH AND WHEEZING ((PLAN C))). ) 360 mL 3  . metoprolol tartrate (LOPRESSOR) 25 MG tablet Take 0.5 tablets (12.5 mg total) by mouth 2 (two) times daily. 30 tablet 6  . Multiple Vitamins-Minerals (CENTRUM SILVER PO) Take 1 tablet by mouth daily.     . OXYGEN Use 2L at bedtime and as needed for shortness of breath    . pantoprazole (PROTONIX) 40 MG tablet Take 1 tablet (40 mg total) by mouth daily. 90 tablet 1  . predniSONE (DELTASONE) 10 MG tablet Take  2 each am  until better, then one daily (Patient taking differently: Take 10mg  daily every morning. Increase to 2 tabs daily until better then return to 1 tab daily for worsening breathing) 100 tablet 2  . sodium chloride (OCEAN) 0.65 % SOLN nasal spray Use as needed for nasal congestion    . temazepam (RESTORIL) 30 MG capsule TAKE ONE CAPSULE BY MOUTH AT BEDTIME AS NEEDED FOR SLEEP 30 capsule 0  . umeclidinium bromide (INCRUSE ELLIPTA) 62.5 MCG/INH AEPB Inhale 1 puff into the lungs daily. 1 each 2   No current facility-administered medications for this visit.       Physical Exam:   BP (!) 148/74   Pulse 91   Resp 18   Ht 5\' 2"  (1.575 m)   Wt 140 lb (63.5 kg)   SpO2 93% Comment: ON RA  BMI 25.61 kg/m   General:  Chronically ill-appearing in no distress  Chest:   Clear and symmetrical breath sounds without wheezing  CV:   Regular rate and rhythm with grade 2/6 systolic murmur heard best along the sternal border  Incisions:  Completely healed  Abdomen:  Soft nontender  Extremities:  Warm and well-perfused, no edema  Diagnostic Tests:   Transthoracic Echocardiography  Patient:    Ronald Bell, Ronald Bell MR #:       [id number] Study Date: 11/29/2015 Gender:     M Age:        15 Height:     157.5 cm Weight:     60.8 kg BSA:        1.64 m^2 Pt. Status: Room:   ATTENDING    Darylene Price, M.D.  ORDERING     Darylene Price, M.D.  REFERRING    Darylene Price, M.D.  PERFORMING   [facility], Outpatient  SONOGRAPHER  Madelin Rear, RDCS  cc:  ------------------------------------------------------------------- LV EF: 60% -   65%  ------------------------------------------------------------------- Indications:      Aortic stenosis 424.1.  ------------------------------------------------------------------- History:   PMH:  TAVR.  Coronary artery disease.  Chronic obstructive pulmonary disease.  Risk factors:   Hypertension.  ------------------------------------------------------------------- Study Conclusions  - Left ventricle: The cavity size was normal. Wall thickness was   normal. Systolic function was normal. The estimated ejection   fraction was in the range of 60% to 65%. Features are consistent   with a pseudonormal left ventricular filling pattern, with   concomitant abnormal relaxation and increased filling pressure   (grade 2 diastolic dysfunction). - Aortic valve: A bioprosthesis was present. Valve area (VTI): 0.75   cm^2. Valve area (Vmax): 0.72 cm^2. Valve area (Vmean): 0.69   cm^2. - Mitral valve: There was mild regurgitation.  ------------------------------------------------------------------- Study data:  Comparison was made to the study of 07/25/2015.  Study status:  Routine.  Procedure:  The patient reported no pain  pre or post test. Transthoracic echocardiography. Image quality was fair. Study completion:  There were no complications. Transthoracic echocardiography.  M-mode, complete 2D, spectral Doppler, and color Doppler.  Birthdate:  Patient birthdate: May 29, 1940.  Age:  Patient is 75 yr old.  Sex:  Gender: male. BMI: 24.5 kg/m^2.  Blood pressure:     130/68  Patient status: Outpatient.  Study date:  Study date: 11/29/2015. Study time: 10:42 AM.  Location:  [facility].  -------------------------------------------------------------------  ------------------------------------------------------------------- Left ventricle:  The global longitudinal strain is -13.2%.  The cavity size was normal. Wall thickness was normal. Systolic function was normal. The estimated ejection fraction was in the range of 60% to 65%. Features are consistent with a pseudonormal left ventricular filling pattern, with concomitant abnormal relaxation and increased filling pressure (grade 2  diastolic dysfunction).  ------------------------------------------------------------------- Aortic valve:  A bioprosthesis was present.  Doppler:   There was no stenosis.   There was no regurgitation.    VTI ratio of LVOT to aortic valve: 0.26. Valve area (VTI): 0.75 cm^2. Indexed valve area (VTI): 0.46 cm^2/m^2. Peak velocity ratio of LVOT to aortic valve: 0.25. Valve area (Vmax): 0.72 cm^2. Indexed valve area (Vmax): 0.44 cm^2/m^2. Mean velocity ratio of LVOT to aortic valve: 0.24. Valve area (Vmean): 0.69 cm^2. Indexed valve area (Vmean): 0.42 cm^2/m^2.    Mean gradient (S): 30 mm Hg. Peak gradient (S): 48 mm Hg.  ------------------------------------------------------------------- Aorta:  Aortic root: The aortic root was normal in size. Ascending aorta: The ascending aorta was normal in size.  ------------------------------------------------------------------- Mitral valve:   Structurally normal valve.   Leaflet separation was normal.  Doppler:  Transvalvular velocity was within the normal range. There was no evidence for stenosis. There was mild regurgitation.    Peak gradient (D): 2 mm Hg.  ------------------------------------------------------------------- Left atrium:  The atrium was normal in size.  ------------------------------------------------------------------- Right ventricle:  The cavity size was normal. Systolic function was normal.  ------------------------------------------------------------------- Pulmonic valve:    The valve appears to be grossly normal. Doppler:  There was no significant regurgitation.  ------------------------------------------------------------------- Tricuspid valve:   The valve appears to be grossly normal. Doppler:  There was no significant regurgitation.  ------------------------------------------------------------------- Right atrium:  The atrium was normal in  size.  ------------------------------------------------------------------- Pericardium:  There was no pericardial effusion.  ------------------------------------------------------------------- Measurements   Left ventricle                            Value          Reference  LV ID, ED, PLAX chordal                   48.5  mm       43 - 52  LV ID, ES, PLAX chordal                   31.3  mm       23 - 38  LV fx shortening, PLAX chordal            35    %        >=29  LV PW thickness, ED                       9.4   mm       ---------  IVS/LV PW ratio, ED  1.03           <=1.3  Stroke volume, 2D                         70    ml       ---------  Stroke volume/bsa, 2D                     43    ml/m^2   ---------  LV ejection fraction, 1-p A4C             42    %        ---------  LV end-diastolic volume, 2-p              94    ml       ---------  LV end-systolic volume, 2-p               54    ml       ---------  LV ejection fraction, 2-p                 43    %        ---------  Stroke volume, 2-p                        40    ml       ---------  LV end-diastolic volume/bsa, 2-p          57    ml/m^2   ---------  LV end-systolic volume/bsa, 2-p           33    ml/m^2   ---------  Stroke volume/bsa, 2-p                    24.4  ml/m^2   ---------  LV e&', lateral                            6.64  cm/s     ---------  LV E/e&', lateral                          11.54          ---------  LV e&', medial                             4.13  cm/s     ---------  LV E/e&', medial                           18.55          ---------  LV e&', average                            5.39  cm/s     ---------  LV E/e&', average                          14.22          ---------  Longitudinal strain, TDI                  13    %        ---------    Ventricular  septum                        Value          Reference  IVS thickness, ED                         9.68  mm       ---------    LVOT                                       Value          Reference  LVOT ID, S                                19    mm       ---------  LVOT area                                 2.84  cm^2     ---------  LVOT peak velocity, S                     87.9  cm/s     ---------  LVOT mean velocity, S                     62.7  cm/s     ---------  LVOT VTI, S                               24.7  cm       ---------    Aortic valve                              Value          Reference  Aortic valve peak velocity, S             345   cm/s     ---------  Aortic valve mean velocity, S             259   cm/s     ---------  Aortic valve VTI, S                       93.9  cm       ---------  Aortic mean gradient, S                   30    mm Hg    ---------  Aortic peak gradient, S                   48    mm Hg    ---------  VTI ratio, LVOT/AV                        0.26           ---------  Aortic valve area, VTI                    0.75  cm^2     ---------  Aortic valve area/bsa, VTI                0.46  cm^2/m^2 ---------  Velocity ratio, peak, LVOT/AV             0.25           ---------  Aortic valve area, peak velocity          0.72  cm^2     ---------  Aortic valve area/bsa, peak               0.44  cm^2/m^2 ---------  velocity  Velocity ratio, mean, LVOT/AV             0.24           ---------  Aortic valve area, mean velocity          0.69  cm^2     ---------  Aortic valve area/bsa, mean               0.42  cm^2/m^2 ---------  velocity    Aorta                                     Value          Reference  Aortic root ID, ED                        30    mm       ---------    Left atrium                               Value          Reference  LA ID, A-P, ES                            37    mm       ---------  LA ID/bsa, A-P                     (H)    2.25  cm/m^2   <=2.2  LA volume, S                              51.4  ml       ---------  LA volume/bsa, S                          31.3  ml/m^2    ---------  LA volume, ES, 1-p A4C                    47.3  ml       ---------  LA volume/bsa, ES, 1-p A4C                28.8  ml/m^2   ---------  LA volume, ES, 1-p A2C                    52.6  ml       ---------  LA volume/bsa, ES, 1-p A2C                32    ml/m^2   ---------  Mitral valve                              Value          Reference  Mitral E-wave peak velocity               76.6  cm/s     ---------  Mitral A-wave peak velocity               96    cm/s     ---------  Mitral deceleration time           (H)    257   ms       150 - 230  Mitral peak gradient, D                   2     mm Hg    ---------  Mitral E/A ratio, peak                    0.8            ---------    Systemic veins                            Value          Reference  Estimated CVP                             3     mm Hg    ---------    Right ventricle                           Value          Reference  TAPSE                                     21.4  mm       ---------  RV s&', lateral, S                         7.88  cm/s     ---------  Legend: (L)  and  (H)  mark values outside specified reference range.  ------------------------------------------------------------------- Prepared and Electronically Authenticated by  Mertie Moores, M.D. 2017-09-08T13:45:53  Transthoracic Echocardiography  Patient:    Staffon, Saslow MR #:       [id number] Study Date: 07/25/2015 Gender:     M Age:        66 Height:     157.5 cm Weight:     56.7 kg BSA:        1.58 m^2 Pt. Status: Room:       [id number]   Kerin Salen, Ripudeep K  REFERRING    Rai, Ripudeep K  ADMITTING    Etta Quill  PERFORMING   [facility], Inpatient  SONOGRAPHER  Mikki Santee  ATTENDING    Little, Wenda Overland  cc:  ------------------------------------------------------------------- LV EF: 60% -   65%  ------------------------------------------------------------------- Indications:      CHF -  428.0.  ------------------------------------------------------------------- History:   PMH:   Chronic obstructive pulmonary disease.  Risk factors:  Former tobacco use. Hypertension. Dyslipidemia.  -------------------------------------------------------------------  Study Conclusions  - Left ventricle: The cavity size was normal. Wall thickness was   increased in a pattern of moderate LVH. Systolic function was   normal. The estimated ejection fraction was in the range of 60%   to 65%. - Left atrium: The atrium was mildly dilated.  ------------------------------------------------------------------- Labs, prior tests, procedures, and surgery: S/p TAVR.          Transthoracic echocardiography.  M-mode, complete 2D, spectral Doppler, and color Doppler.  Birthdate: Patient birthdate: 21-Jun-1940.  Age:  Patient is 75 yr old.  Sex: Gender: male.    BMI: 22.9 kg/m^2.  Blood pressure:     119/63 Patient status:  Inpatient.  Study date:  Study date: 07/25/2015. Study time: 10:23 AM.  Location:  [facility].  -------------------------------------------------------------------  ------------------------------------------------------------------- Left ventricle:  The cavity size was normal. Wall thickness was increased in a pattern of moderate LVH. Systolic function was normal. The estimated ejection fraction was in the range of 60% to 65%.  ------------------------------------------------------------------- Aortic valve:  AV is not well imaged Peak and mean gradients through the valve are 27 and 15 mm Hg respectively consistent with mild AS  Doppler:  There was no significant regurgitation.    VTI ratio of LVOT to aortic valve: 0.4. Valve area (VTI): 1 cm^2. Indexed valve area (VTI): 0.63 cm^2/m^2. Peak velocity ratio of LVOT to aortic valve: 0.35. Valve area (Vmax): 0.88 cm^2. Indexed valve area (Vmax): 0.56 cm^2/m^2. Mean velocity ratio of LVOT to aortic valve: 0.31. Valve  area (Vmean): 0.78 cm^2. Indexed valve area (Vmean): 0.49 cm^2/m^2.    Mean gradient (S): 14 mm Hg. Peak gradient (S): 27 mm Hg.  ------------------------------------------------------------------- Aorta:  The aorta was normal, not dilated, and non-diseased.  ------------------------------------------------------------------- Mitral valve:   Mildly thickened leaflets .  Doppler:  There was trivial regurgitation.  ------------------------------------------------------------------- Left atrium:  The atrium was mildly dilated.  ------------------------------------------------------------------- Right ventricle:  The cavity size was normal. Wall thickness was normal. Systolic function was normal.  ------------------------------------------------------------------- Pulmonic valve:   Poorly visualized.  Doppler:  There was no significant regurgitation.  ------------------------------------------------------------------- Tricuspid valve:   Structurally normal valve.   Leaflet separation was normal.  Doppler:  Transvalvular velocity was within the normal range. There was no regurgitation.  ------------------------------------------------------------------- Right atrium:  The atrium was normal in size.  ------------------------------------------------------------------- Pericardium:  There was no pericardial effusion.  ------------------------------------------------------------------- Systemic veins: Inferior vena cava: The vessel was normal in size. The respirophasic diameter changes were in the normal range (>= 50%), consistent with normal central venous pressure.  ------------------------------------------------------------------- Post procedure conclusions Ascending Aorta:  - The aorta was normal, not dilated, and non-diseased.  ------------------------------------------------------------------- Measurements   Left ventricle                            Value           Reference  LV ID, ED, PLAX chordal                   46.1  mm       43 - 52  LV ID, ES, PLAX chordal                   27.5  mm       23 - 38  LV fx shortening, PLAX chordal            40    %        >=  29  LV PW thickness, ED                       15.6  mm       ---------  IVS/LV PW ratio, ED                       0.76           <=1.3  Stroke volume, 2D                         65    ml       ---------  Stroke volume/bsa, 2D                     41    ml/m^2   ---------  LV e&', lateral                            6.58  cm/s     ---------  LV E/e&', lateral                          10.43          ---------  LV e&', medial                             4.5   cm/s     ---------  LV E/e&', medial                           15.24          ---------  LV e&', average                            5.54  cm/s     ---------  LV E/e&', average                          12.38          ---------    Ventricular septum                        Value          Reference  IVS thickness, ED                         11.8  mm       ---------    LVOT                                      Value          Reference  LVOT ID, S                                18    mm       ---------  LVOT area  2.54  cm^2     ---------  LVOT peak velocity, S                     90.2  cm/s     ---------  LVOT mean velocity, S                     52.5  cm/s     ---------  LVOT VTI, S                               25.4  cm       ---------  LVOT peak gradient, S                     3     mm Hg    ---------    Aortic valve                              Value          Reference  Aortic valve peak velocity, S             261   cm/s     ---------  Aortic valve mean velocity, S             171   cm/s     ---------  Aortic valve VTI, S                       64.3  cm       ---------  Aortic mean gradient, S                   14    mm Hg    ---------  Aortic peak gradient, S                   27    mm Hg     ---------  VTI ratio, LVOT/AV                        0.4            ---------  Aortic valve area, VTI                    1     cm^2     ---------  Aortic valve area/bsa, VTI                0.63  cm^2/m^2 ---------  Velocity ratio, peak, LVOT/AV             0.35           ---------  Aortic valve area, peak velocity          0.88  cm^2     ---------  Aortic valve area/bsa, peak               0.56  cm^2/m^2 ---------  velocity  Velocity ratio, mean, LVOT/AV             0.31           ---------  Aortic valve area, mean velocity          0.78  cm^2     ---------  Aortic valve area/bsa, mean  0.49  cm^2/m^2 ---------  velocity    Aorta                                     Value          Reference  Aortic root ID, ED                        22    mm       ---------    Left atrium                               Value          Reference  LA ID, A-P, ES                            41    mm       ---------  LA ID/bsa, A-P                    (H)     2.59  cm/m^2   <=2.2  LA volume, ES, 1-p A4C                    42    ml       ---------  LA volume/bsa, ES, 1-p A4C                26.6  ml/m^2   ---------  LA volume, ES, 1-p A2C                    55    ml       ---------  LA volume/bsa, ES, 1-p A2C                34.8  ml/m^2   ---------    Mitral valve                              Value          Reference  Mitral E-wave peak velocity               68.6  cm/s     ---------  Mitral A-wave peak velocity               86.4  cm/s     ---------  Mitral deceleration time          (H)     275   ms       150 - 230  Mitral E/A ratio, peak                    0.8            ---------    Right ventricle                           Value          Reference  RV s&', lateral, S                         11.4  cm/s     ---------  Legend: (L)  and  (  H)  mark values outside specified reference range.  ------------------------------------------------------------------- Prepared and Electronically  Authenticated by  Dorris Carnes, M.D. 2017-05-04T12:14:42   Transthoracic Echocardiography  Patient:    Serafino, Haden MR #:       [id number] Study Date: 12/24/2014 Gender:     M Age:        41 Height:     160 cm Weight:     62.6 kg BSA:        1.68 m^2 Pt. Status: Room:   ATTENDING    Darlina Guys, MD  Willia Craze, Christopher  REFERRING    McAlhany, Wright, Outpatient  SONOGRAPHER  Roseanna Rainbow  cc:  ------------------------------------------------------------------- LV EF: 60% -   65%  ------------------------------------------------------------------- Indications:      424.1 Aortic valve disorders.  ------------------------------------------------------------------- History:   PMH:  S/P Aortic valve replacement. Aortic stenosis. Dyspnea.  Coronary artery disease.  Risk factors:  Hypertension. Dyslipidemia.  ------------------------------------------------------------------- Study Conclusions  - Left ventricle: The cavity size was normal. There was mild   concentric hypertrophy. Systolic function was normal. The   estimated ejection fraction was in the range of 60% to 65%. Wall   motion was normal; there were no regional wall motion   abnormalities. Doppler parameters are consistent with abnormal   left ventricular relaxation (grade 1 diastolic dysfunction).   Doppler parameters are consistent with elevated ventricular   end-diastolic filling pressure. - Aortic valve: Mobility was not restricted. Mean gradient (S): 7   mm Hg. Peak gradient (S): 11 mm Hg. Valve area (VTI): 1.46 cm^2.   Valve area (Vmax): 1.58 cm^2. Valve area (Vmean): 1.45 cm^2. - Aortic root: The aortic root was normal in size. - Mitral valve: Structurally normal valve. - Left atrium: The atrium was mildly dilated. - Right ventricle: The cavity size was normal. Wall thickness was   normal. Systolic function was normal. - Right atrium: The atrium  was normal in size. - Tricuspid valve: There was no regurgitation. - Pulmonic valve: There was no regurgitation. - Pulmonary arteries: Systolic pressure couldn&'t be assessed as   there was no tricuspid regurgitation. - Inferior vena cava: The vessel was normal in size. - Pericardium, extracardiac: There was no pericardial effusion.  Impressions:  - There is no difference when compared to the prior study from   12/06/2014, transaortic gradient remain normal.  Transthoracic echocardiography.  M-mode, complete 2D, spectral Doppler, and color Doppler.  Birthdate:  Patient birthdate: February 13, 1941.  Age:  Patient is 75 yr old.  Sex:  Gender: male. BMI: 24.4 kg/m^2.  Blood pressure:     129/71  Patient status: Outpatient.  Study date:  Study date: 12/24/2014. Study time: 11:16 AM.  Location:  Echo laboratory.  -------------------------------------------------------------------  ------------------------------------------------------------------- Left ventricle:  The cavity size was normal. There was mild concentric hypertrophy. Systolic function was normal. The estimated ejection fraction was in the range of 60% to 65%. Wall motion was normal; there were no regional wall motion abnormalities. Doppler parameters are consistent with abnormal left ventricular relaxation (grade 1 diastolic dysfunction). Doppler parameters are consistent with elevated ventricular end-diastolic filling pressure.  ------------------------------------------------------------------- Aortic valve:  A bioprosthetic aortic valve sits well in the aortic position. There is no central regurgitation or paravalvular leak. Normal transaortic gradients. Mobility was not restricted. Doppler:  Transvalvular velocity was within the normal range. There was no stenosis. There was no regurgitation.    VTI ratio of LVOT to aortic valve: 0.47. Valve area (  VTI): 1.46 cm^2. Indexed valve area (VTI): 0.87 cm^2/m^2. Peak velocity  ratio of LVOT to aortic valve: 0.5. Valve area (Vmax): 1.58 cm^2. Indexed valve area (Vmax): 0.94 cm^2/m^2. Mean velocity ratio of LVOT to aortic valve: 0.46. Valve area (Vmean): 1.45 cm^2. Indexed valve area (Vmean): 0.86 cm^2/m^2.    Mean gradient (S): 7 mm Hg. Peak gradient (S): 11 mm Hg.  ------------------------------------------------------------------- Aorta:  Aortic root: The aortic root was normal in size.  ------------------------------------------------------------------- Mitral valve:   Structurally normal valve.   Mobility was not restricted.  Doppler:  Transvalvular velocity was within the normal range. There was no evidence for stenosis. There was trivial regurgitation.    Peak gradient (D): 3 mm Hg.  ------------------------------------------------------------------- Left atrium:  The atrium was mildly dilated.  ------------------------------------------------------------------- Right ventricle:  The cavity size was normal. Wall thickness was normal. Systolic function was normal.  ------------------------------------------------------------------- Pulmonic valve:    Structurally normal valve.   Cusp separation was normal.  Doppler:  Transvalvular velocity was within the normal range. There was no evidence for stenosis. There was no regurgitation.  ------------------------------------------------------------------- Tricuspid valve:   Structurally normal valve.    Doppler: Transvalvular velocity was within the normal range. There was no regurgitation.  ------------------------------------------------------------------- Pulmonary artery:   The main pulmonary artery was normal-sized. Systolic pressure couldn&'t be assessed as there was no tricuspid regurgitation.  ------------------------------------------------------------------- Right atrium:  The atrium was normal in  size.  ------------------------------------------------------------------- Pericardium:  There was no pericardial effusion.  ------------------------------------------------------------------- Systemic veins: Inferior vena cava: The vessel was normal in size.  ------------------------------------------------------------------- Measurements   Left ventricle                            Value          Reference  LV ID, ED, PLAX chordal                   45.9  mm       43 - 52  LV ID, ES, PLAX chordal                   25.5  mm       23 - 38  LV fx shortening, PLAX chordal            44    %        >=29  LV PW thickness, ED                       12    mm       ---------  IVS/LV PW ratio, ED                       1.07           <=1.3  Stroke volume, 2D                         70    ml       ---------  Stroke volume/bsa, 2D                     42    ml/m^2   ---------  LV ejection fraction, 1-p A4C             49    %        ---------  LV end-diastolic volume, 2-p  69    ml       ---------  LV end-systolic volume, 2-p               35    ml       ---------  LV ejection fraction, 2-p                 50    %        ---------  Stroke volume, 2-p                        34    ml       ---------  LV end-diastolic volume/bsa, 2-p          41    ml/m^2   ---------  LV end-systolic volume/bsa, 2-p           21    ml/m^2   ---------  Stroke volume/bsa, 2-p                    20.4  ml/m^2   ---------  LV e&', lateral                            7.62  cm/s     ---------  LV E/e&', lateral                          10.63          ---------  LV e&', medial                             4.9   cm/s     ---------  LV E/e&', medial                           16.53          ---------  LV e&', average                            6.26  cm/s     ---------  LV E/e&', average                          12.94          ---------    Ventricular septum                        Value          Reference  IVS  thickness, ED                         12.8  mm       ---------    LVOT                                      Value          Reference  LVOT ID, S                                20    mm       ---------  LVOT area                                 3.14  cm^2     ---------  LVOT peak velocity, S                     84.8  cm/s     ---------  LVOT mean velocity, S                     56.7  cm/s     ---------  LVOT VTI, S                               22.2  cm       ---------  LVOT peak gradient, S                     3     mm Hg    ---------    Aortic valve                              Value          Reference  Aortic valve peak velocity, S             168   cm/s     ---------  Aortic valve mean velocity, S             123   cm/s     ---------  Aortic valve VTI, S                       47.7  cm       ---------  Aortic mean gradient, S                   7     mm Hg    ---------  Aortic peak gradient, S                   11    mm Hg    ---------  VTI ratio, LVOT/AV                        0.47           ---------  Aortic valve area, VTI                    1.46  cm^2     ---------  Aortic valve area/bsa, VTI                0.87  cm^2/m^2 ---------  Velocity ratio, peak, LVOT/AV             0.5            ---------  Aortic valve area, peak velocity          1.58  cm^2     ---------  Aortic valve area/bsa, peak               0.94  cm^2/m^2 ---------  velocity  Velocity ratio, mean, LVOT/AV             0.46           ---------  Aortic valve area, mean velocity  1.45  cm^2     ---------  Aortic valve area/bsa, mean               0.86  cm^2/m^2 ---------  velocity    Aorta                                     Value          Reference  Aortic root ID, ED                        31    mm       ---------    Left atrium                               Value          Reference  LA ID, A-P, ES                            47    mm       ---------  LA ID/bsa, A-P                    (H)     2.8   cm/m^2    <=2.2  LA volume, S                              48.9  ml       ---------  LA volume/bsa, S                          29.1  ml/m^2   ---------  LA volume, ES, 1-p A4C                    45.5  ml       ---------  LA volume/bsa, ES, 1-p A4C                27.1  ml/m^2   ---------  LA volume, ES, 1-p A2C                    51.8  ml       ---------  LA volume/bsa, ES, 1-p A2C                30.9  ml/m^2   ---------    Mitral valve                              Value          Reference  Mitral E-wave peak velocity               81    cm/s     ---------  Mitral A-wave peak velocity               102   cm/s     ---------  Mitral deceleration time          (H)     246   ms       150 - 230  Mitral peak gradient, D  3     mm Hg    ---------  Mitral E/A ratio, peak                    0.8            ---------    Systemic veins                            Value          Reference  Estimated CVP                             3     mm Hg    ---------    Right ventricle                           Value          Reference  TAPSE                                     21.2  mm       ---------  RV s&', lateral, S                         10.4  cm/s     ---------  Legend: (L)  and  (H)  mark values outside specified reference range.  ------------------------------------------------------------------- Prepared and Electronically Authenticated by  Ena Dawley, M.D. 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  Over the past 6 months he has had worsening problems with exertional shortness breath and several hospitalizations for acute exacerbations, all of which have been blamed on the patient's underlying severe COPD. At present he describes stable but significant symptoms of exertional  shortness of breath with minimal activity and occasionally at rest.  Symptoms have gotten worse over the past 6 months despite the fact the patient quit smoking prior to his transcatheter aortic valve replacement. He has been a bit more stable over the past few months with adjustments in his medical therapy for COPD. Nonetheless, symptoms may be consistent with chronic diastolic congestive heart failure, [place] Heart Association functional class IIIB. The patient states that his breathing is actually worse than it was prior to surgery.  I have personally reviewed the patient's recent follow-up transthoracic echocardiogram. It is difficult to reconcile the report which does not coincide with the findings on the echo. The leaflets of the aortic valve are not well visualized. However, there is clearly no significant aortic insufficiency. The report states "no stenosis" and yet the peak velocity across the aortic valve was reported close to 3.5 m/s corresponding to mean transvalvular gradient estimated 30 mmHg and valve area ranging between 0.69 and 0.75 cm. Peak velocity across the aortic valve measured at the time of the patient's echo last October was reported 1.7 m/s whereas another follow-up echocardiogram performed last May reported a peak velocity across the aortic valve of 2.6 m/s. These findings are concerning for the possibility of significant prosthetic valve dysfunction. Of note, the patient could not be maintained on standard anticoagulation therapy following his  TAVR because of GI bleeding.    Plan:  I think it might be wise to obtain trans-esophageal echocardiogram to further evaluate the patient's aortic valve prosthesis and whether or not there is significant dysfunction, potentially related to thrombosis of the leaflets. The patient is scheduled to see Dr. Marlou Porch in follow-up tomorrow. Will discuss with Dr. Marlou Porch and other members of the team. We have not recommended any changes to the  patient's current medications at this time. All of his questions have been addressed.   I spent in excess of 30 minutes during the conduct of this office consultation and >50% of this time involved direct face-to-face encounter with the patient for counseling and/or coordination of their care.     Valentina Gu. Roxy Manns, MD 12/02/2015 9:39 AM

## 2015-12-19 NOTE — CV Procedure (Signed)
    TEE  TAVR 27mm Edwards  Findings  -Left coronary cusp malfunction is present.  -Severe aortic stenosis (4.85m/s, 22mmHg mean gradient) -Mild to moderate aortic regurgitation, central, linear Doppler signal correlating with malcoaptation of left cusp.   EF normal 55% Mild MR No LA thrombus  Will discuss further the Dr. Roxy Manns Challenging situation given his prior GI bleed (AVM's) with Hg 5.  May need to empirically try warfarin with close monitoring with the hope that valve malfunction may improve.   See full report for details. Appreciate anesthesia assistance. (COPD)  Candee Furbish, MD

## 2015-12-19 NOTE — Discharge Instructions (Signed)

## 2015-12-19 NOTE — Interval H&P Note (Signed)
History and Physical Interval Note:  12/19/2015 10:40 AM  Ronald Bell  has presented today for surgery, with the diagnosis of AORTIC VALVE DISEASE  The various methods of treatment have been discussed with the patient and family. After consideration of risks, benefits and other options for treatment, the patient has consented to  Procedure(s): TRANSESOPHAGEAL ECHOCARDIOGRAM (TEE) (N/A) as a surgical intervention .  The patient's history has been reviewed, patient examined, no change in status, stable for surgery.  I have reviewed the patient's chart and labs.  Questions were answered to the patient's satisfaction.     [employer]

## 2015-12-20 ENCOUNTER — Telehealth: Payer: Self-pay | Admitting: Family Medicine

## 2015-12-20 ENCOUNTER — Encounter (HOSPITAL_COMMUNITY): Payer: Self-pay | Admitting: Cardiology

## 2015-12-20 DIAGNOSIS — F411 Generalized anxiety disorder: Secondary | ICD-10-CM

## 2015-12-20 MED ORDER — ALPRAZOLAM 0.5 MG PO TABS: 0.5000 mg | ORAL_TABLET | Freq: Three times a day (TID) | ORAL | 1 refills | Status: DC | PRN

## 2015-12-20 NOTE — Telephone Encounter (Signed)
Rx faxed. Ok per previous phone note.    KP

## 2015-12-20 NOTE — Telephone Encounter (Signed)
[facility], [facility] [place] [phone] (Phone) [phone] (Fax)     Reason for call:  Pharmacy states patient is unhappy 60 tabs were prescribed rather then 90 tabs pharmacy requesting a new Rx for ALPRAZolam (XANAX) 0.5 MG tablet. Please advise

## 2015-12-24 ENCOUNTER — Telehealth: Payer: Self-pay

## 2015-12-24 NOTE — Telephone Encounter (Signed)
Received from [facility].  The form is for patient's recertification in the Home based Palliative Care program.  Form forwarded to PCP for review and signature. 

## 2015-12-26 NOTE — Anesthesia Postprocedure Evaluation (Signed)
Anesthesia Post Note  Patient: Ronald Bell  Procedure(s) Performed: Procedure(s) (LRB): TRANSESOPHAGEAL ECHOCARDIOGRAM (TEE) (N/A)  Patient location during evaluation: Endoscopy Anesthesia Type: MAC Level of consciousness: awake Pain management: pain level controlled Vital Signs Assessment: post-procedure vital signs reviewed and stable Respiratory status: spontaneous breathing Cardiovascular status: stable Postop Assessment: no signs of nausea or vomiting Anesthetic complications: no    Last Vitals:  Vitals:   12/19/15 1200 12/19/15 1210  BP: (!) 104/51 (!) 156/65  Pulse: 83 75  Resp: 18 11  Temp:      Last Pain:  Vitals:   12/19/15 1143  TempSrc: Oral                 Tyrelle Raczka

## 2015-12-26 NOTE — Anesthesia Preprocedure Evaluation (Signed)
Anesthesia Evaluation  Patient identified by MRN, date of birth, ID band Patient awake    Reviewed: Allergy & Precautions, NPO status , Patient's Chart, lab work & pertinent test results, reviewed documented beta blocker date and time   History of Anesthesia Complications Negative for: history of anesthetic complications  Airway Mallampati: I  TM Distance: >3 FB Neck ROM: Full    Dental  (+) Edentulous Upper, Edentulous Lower, Dental Advisory Given   Pulmonary shortness of breath and at rest, COPD, former smoker,    breath sounds clear to auscultation       Cardiovascular hypertension, Pt. on home beta blockers + angina + CAD, + Peripheral Vascular Disease and +CHF  + Valvular Problems/Murmurs AS  Rhythm:Regular Rate:Normal     Neuro/Psych  Neuromuscular disease    GI/Hepatic GERD  ,  Endo/Other    Renal/GU Renal disease     Musculoskeletal  (+) Arthritis ,   Abdominal   Peds  Hematology   Anesthesia Other Findings   Reproductive/Obstetrics                             Anesthesia Physical Anesthesia Plan  ASA: III  Anesthesia Plan: MAC   Post-op Pain Management:    Induction: Intravenous  Airway Management Planned: Nasal Cannula, Natural Airway and Simple Face Mask  Additional Equipment: None  Intra-op Plan:   Post-operative Plan:   Informed Consent: I have reviewed the patients History and Physical, chart, labs and discussed the procedure including the risks, benefits and alternatives for the proposed anesthesia with the patient or authorized representative who has indicated his/her understanding and acceptance.   Dental advisory given  Plan Discussed with: CRNA and Surgeon  Anesthesia Plan Comments:         Anesthesia Quick Evaluation

## 2015-12-27 ENCOUNTER — Other Ambulatory Visit: Payer: Self-pay | Admitting: Internal Medicine

## 2016-01-10 ENCOUNTER — Other Ambulatory Visit: Payer: Self-pay | Admitting: Internal Medicine

## 2016-01-16 NOTE — Telephone Encounter (Signed)
Done

## 2016-01-21 ENCOUNTER — Encounter: Payer: Self-pay | Admitting: Internal Medicine

## 2016-01-21 ENCOUNTER — Ambulatory Visit (INDEPENDENT_AMBULATORY_CARE_PROVIDER_SITE_OTHER): Payer: Medicare Other | Admitting: Internal Medicine

## 2016-01-21 VITALS — BP 132/64 | HR 84 | Ht 62.0 in | Wt 148.0 lb

## 2016-01-21 DIAGNOSIS — I2510 Atherosclerotic heart disease of native coronary artery without angina pectoris: Secondary | ICD-10-CM

## 2016-01-21 DIAGNOSIS — J9612 Chronic respiratory failure with hypercapnia: Secondary | ICD-10-CM | POA: Diagnosis not present

## 2016-01-21 DIAGNOSIS — J441 Chronic obstructive pulmonary disease with (acute) exacerbation: Secondary | ICD-10-CM

## 2016-01-21 DIAGNOSIS — J9611 Chronic respiratory failure with hypoxia: Secondary | ICD-10-CM | POA: Diagnosis not present

## 2016-01-21 DIAGNOSIS — I2583 Coronary atherosclerosis due to lipid rich plaque: Secondary | ICD-10-CM

## 2016-01-21 DIAGNOSIS — J449 Chronic obstructive pulmonary disease, unspecified: Secondary | ICD-10-CM

## 2016-01-21 MED ORDER — UMECLIDINIUM BROMIDE 62.5 MCG/INH IN AEPB: 1.0000 | INHALATION_SPRAY | Freq: Every day | RESPIRATORY_TRACT | 11 refills | Status: DC

## 2016-01-21 MED ORDER — ARFORMOTEROL TARTRATE 15 MCG/2ML IN NEBU: 15.0000 ug | INHALATION_SOLUTION | Freq: Two times a day (BID) | RESPIRATORY_TRACT | 3 refills | Status: DC

## 2016-01-21 NOTE — Patient Instructions (Addendum)
See calendar for specific medication instructions and bring it back for each and every office visit for every healthcare provider you see.  Without it,  you may not receive the best quality medical care that we feel you deserve.  You will note that the calendar groups together  your maintenance  medications that are timed at particular times of the day.  Think of this as your checklist for what your doctor has instructed you to do until your next evaluation to see what benefit  there is  to staying on a consistent group of medications intended to keep you well.  The other group at the bottom is entirely up to you to use as you see fit  for specific symptoms that may arise between visits that require you to treat them on an as needed basis.  Think of this as your action plan or "what if" list.   Separating the top medications from the bottom group is fundamental to providing you adequate care going forward.    I will send Dr Marlou Porch a message re your follow up  Please schedule a follow up visit in 3 months but call sooner if needed

## 2016-01-21 NOTE — Progress Notes (Signed)
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 acei cough   History of Present Illness  03/27/2013 1st [facility] office visit/ Ronald Bell still active smoker cc recurrent episodes of severe refractory cough this episode started x 3 weeks prior to OV   but comes and goes since early fall 2014 assoc with doe x  walking garbage to street x 3 years better up to several hours after combivent on tudorza bid miant as well.  Cough is variably prod of thick yellow mucus but min amt, mostly prod in am and dry rest of the day.  Has noct choking spells just about every night p lies down better if sits up and takes combivent - they feel almost  like something strangling him.  rec Stop lisinopril Benicar 20/12.5 one daily  Pantoprazole (protonix) 40 mg   Take 30-60 min before first meal of the day and Pepcid ac 20 mg one bedtime until return to office   The key is to stop smoking completely before smoking completely stops you!    04/04/2013  Acute  ov/Ronald Bell re: refractory cough  Chief Complaint  Patient presents with  . Acute Visit    Pt c/o increased cough since last visit. Cough is non prod and worse at night and early am. He states that he gets SOB with coughing spells and is using combivent 5 times daily. He states had episode of syncope last night and EMS was called.    really not sob unless coughing. No excess mucus, present 24/7  rec Stop tudorza Prednisone 10 mg take  4 each am x 2 days,   2 each am x 2 days,  1 each am x 2 days and stop  mucinex dm 1200 twice daily as long as you feel the urge to cough and supplement it with Tramadol 50 up to 2 to every 4 hours- once the cough is gone back off of it. Prednisone 10 mg take  4 each  am x 2 days,   2 each am x 2 days,  1 each am x 2 days and stop  Pantoprazole (protonix) 40 mg   Take 30-60 min before first meal of the day and Pepcid 20 mg one bedtime until return to office - this is the best way to tell whether stomach acid is contributing to your problem.   GERD diet   04/28/2013 f/u ov/Ronald Bell re: GOLD III COPD / cough much better off ACEi Chief Complaint  Patient presents with  . Follow-up    Pt states breathing is slightly improved and cough is much improved. He is using combivent approx 4 times per day.   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 Acute Visit    Pt c/o increased SOB off and on since Feb 2016. He states that he wakes up in the night feeling out of breath and gets SOB walking from room to room at home. He is using combivent 5-6 x per day.    sleeping propped up now using noct combivent as well as q am breo   Cancelled ov with Dr Ronald Bell today "I wanted to get my breathing right first" rec Prednisone 10 mg take  4 each am x 2 days,   2 each am x 2 days,  1 each am x 2 days and stop  Add pepcid 20 mg at bedtime (over the counter) Prop up as much as possible at bedtime  No change Breo each am combivent up to 1 puff every 6 hours as needed  Keep appt with Ronald Bell  surgery    Last Ronald Bell surgery note  12/24/14  Patient returns for routine follow-up up approximately one month status post transcatheter aortic valve replacement using a 26 mm Edwards Sapien XT transcatheter heart valve placed via trans-apical approach on 11/20/2014. His early postoperative recovery in the hospital was notable for paroxysmal atrial fibrillation for which he was treated with amiodarone. He was discharged from the hospital on the seventh postoperative day in sinus rhythm. Because of his history of recurrent GI bleeding in the past he was not anticoagulated using warfarin. Dual antiplatelets therapy using aspirin and Plavix was resumed because of recent PCI and stenting of the right coronary artery. He was readmitted to the hospital one week later with melena and symptomatic anemia with hemoglobin 5.1. He was transfused a total of 5 units packed red blood cells and underwent both upper and lower endoscopy with ablation of multiple AVMs. During his hospitalization he also underwent left thoracentesis for a moderate sized left pleural effusion. He was eventually discharged home and he returns to our office today for routine follow-up. He was seen in follow-up earlier today by Dr. [name]. He has not had any further signs of GI bleeding and his hemoglobin has remained stable 11.5. The patient states that he is still limited by exertional shortness of breath that is multifactorial and related to his underlying COPD and chronic diastolic congestive heart failure. He has not had any chest pain or chest tightness. Appetite is good. Home health physical therapy has been working with him and he has been making slow but steady progress. He does not wish to participate in outpatient cardiac rehabilitation program.    01/10/2015  Ext f/u  ov/Ronald Bell re:  Re-establish care post op COPD GOLD III on Breo and prn combivent  Chief Complaint  Patient presents with  . Follow-up    COPD f/u pt. states breathing has  wrosen. SOB with activity. no wheezing. no cough. no chest pain/tightness. on 3L O2 pulse.   Can walk up and back driveway RA  2 x then checks 02 sats 88-89% then walks 2 more  Times was easier before surgery and not on 02 at that point  but still on BREO Finished amio one week prior to OV   Wearing 02 hs  3lpm  rec Stop breo and  combivent start stiolto 2 pffs each am  Only use your albuterol (proair) as a rescue medication 02  2lpm sleeping and walking  No need for 02 inside the house with activties during the day  unless excercising  Prednisone 10 mg take  4 each am x 2 days,   2 each am x 2 days,  1 each am x 2 days and stop  Please schedule a follow up office visit in 6 weeks, call sooner if needed with pfts    02/22/2015  f/u ov/Ronald Bell re: GOLD III copd/ still smoking on anoro / alb 2 puffs around lunch time  Chief Complaint  Patient presents with  . Follow-up    pt following fore COPD with PFT today: pt c/o the regular SOB, and dry cough at times. no c/o wheezing or chest tightness.   walking up to 4 min back and forth to mb on 2lpm   rec Continue Anoro one click each am   Only use your albuterol (proair) as a rescue medication  02  2lpm sleeping and walking  No need for 02 inside the house with activties during the day  unless excercising   Admit date: 05/18/2015 Discharge date: 05/20/2015  Discharge Diagnoses:   COPD exacerbation  Hyperlipidemia  Iron deficiency anemia  Essential hypertension  GERD  Aortic stenosis, severe  Former smoker  S/P TAVR (transcatheter aortic valve replacement)  CAD (coronary artery disease)  Depression  Acute on chronic respiratory failure (HCC)  Elevated lactic acid level   05/24/2015  Extended transition of care  ov/Ronald Bell re: COPD  GOLD III criteria/ 02 rx 2lpm with activity and hs   Chief Complaint  Patient presents with  . Follow-up    Recently admitted to hospital from 05/18/15-03/18/16 with COPD flare. He states had to to  back to ED on 05/21/15 with SOB and had neg CT Angio. He states that his breathing seems better today and breathing is back at baseline.   on ppi pc and anoro plus freq saba in multiple forms  rec Plan A = Automatic = stop Anoro and try BEVESPI  Take 2 puffs first thing in am and then another 2 puffs about 12 hours later.                                     Change protonix to 40mg  Take 30-60 min before first meal of the day and add Pepcid ac 20 mg at bedtime  Plan B = Backup Only use your albuterol as a rescue medication Plan C = Crisis - only use your albuterol nebulizer if you first try Plan B and it fails to help > ok to use the nebulizer up to every 4 hours but if start needing it regularly call for immediate appointment Plan D = Doctor - call me if B and C not adequate Plan E = ER - go to ER or call 911 if all else fails       01/21/2016  f/u ov/Ronald Bell re: copd III/ 02 hs and prn/ maint maint brov/bud/incruse and pred 10 mg daily  Chief Complaint  Patient presents with  . Follow-up    Today is a bad day with his breathing. He has noticed some wheezing in the am after he uses neb- lies down for a few min and it goes away.     Doe =  MMRC4  = sob if tries to leave home or while getting dressed     No obvious day to day or daytime variability or assoc excess/ purulent sputum or mucus plugs or hemoptysis or cp or chest tightness, overt sinus or hb symptoms. No unusual exp hx or h/o childhood pna/ asthma or knowledge of premature birth.  Sleeping ok without nocturnal  or early am exacerbation  of respiratory  c/o's or need for noct saba. Also denies any obvious fluctuation of symptoms with weather or environmental changes or other aggravating or alleviating factors except as outlined above   Current Medications, Allergies, Complete Past Medical History, Past Surgical History, Family History, and Social History were reviewed in [employer] record.  ROS  The following  are not active complaints unless bolded sore throat, dysphagia, dental problems, itching, sneezing,  nasal congestion or excess/ purulent secretions, ear ache,   fever, chills, sweats, unintended wt loss, classically pleuritic or exertional cp,  orthopnea pnd or leg swelling, presyncope, palpitations, abdominal pain, anorexia, nausea, vomiting, diarrhea  or change in bowel or bladder habits, change in stools or urine, dysuria,hematuria,  rash, arthralgias, visual complaints, headache, numbness, weakness or ataxia or problems with walking or coordination,  change in mood/affect or memory.                    Objective:   Physical Exam    amb wm nad    / vital signs reviewed / note sats 92% on arrival on RA   06/04/2014        137 > 01/10/2015  136 > 02/22/2015  138 > 05/24/2015  135 > 08/23/2015    134  > 12/09/2015  141 > 01/21/2016  148    HEENT mild turbinate edema.  Oropharynx no thrush or excess pnd or cobblestoning.  No JVD or cervical adenopathy. Mild accessory muscle hypertrophy. Trachea midline, nl thryroid. Chest was hyperinflated by percussion with diminished breath sounds and moderate increased exp time no wheeze at all   Hoover sign positive at mid inspiration. Regular rate and rhythm with  II=III /VI sem no  gallop or rub or increase P2 or edema.  Abd: no hsm, nl excursion. Ext warm without cyanosis or clubbing.    CXR PA and Lateral:   12/09/2015 :    I personally reviewed images and agree with radiology impression as follows:   Persistent evidence of prior asbestos exposure. Scarring anterior left mid lung. No frank edema or consolidation. Status post aortic valve replacement. Aortic atherosclerosis noted.

## 2016-01-22 NOTE — Assessment & Plan Note (Signed)
-   PFT's 04/28/2013  FEV1 0.88 (40%) with ratio 44 and 14% better p B2 dlco 45 corrects to 83 - Trial of breo 04/28/2013 > improved symptoms  06/09/2013  - spirometry 06/04/2014 FEV1  0.76 (29%) ratio 45  -01/10/2015   trial of stiolto > insurance declined  - PFT's  02/22/2015  FEV1 0.84 (40 % ) ratio 41  p 5 % improvement from saba with DLCO  40 % corrects to 51 % for alv volume   - 02/22/15  rec rehab/ declined - 05/24/2015  extensive coaching HFA effectiveness =    75% > try change anoro to BEVESPI  - multilple admits > tried on daliresp > no better so 08/23/2015 rec d/c when present bottle used up   - 08/23/2015 pred rx daily 20 if doing poor/ 10 if doing better  -med calendar 09/06/2015  - 12/09/2015  After extensive coaching HFA effectiveness =    75% (short Ti)    Have not been able to taper pred at this point. The goal with a chronic steroid dependent illness is always arriving at the lowest effective dose that controls the disease/symptoms and not accepting a set "formula" which is based on statistics or guidelines that don't always take into account patient  variability or the natural hx of the dz in every individual patient, which may well vary over time.  For now therefore I recommend the patient maintain  Ceiling of 20 and a floor of 10 mg daily   I had an extended discussion with the patient reviewing all relevant studies completed to date and  lasting 15 to 20 minutes of a 25 minute visit    Each maintenance medication was reviewed in detail including most importantly the difference between maintenance and prns and under what circumstances the prns are to be triggered using an action plan format that is not reflected in the computer generated alphabetically organized AVS but trather by a customized med calendar that reflects the AVS meds with confirmed 100% correlation.   Please see instructions for details which were reviewed in writing and the patient given a copy highlighting the part that I  personally wrote and discussed at today's ov.

## 2016-01-22 NOTE — Assessment & Plan Note (Signed)
HC03  12/17/14  32  01/10/2015   Walked RA  2 laps @ 185 ft each stopped due to  Sob with sat 89% at nl pace - 05/24/2015  Walked 2lpm x 3 laps @ 185 ft each stopped due to  End of study, nl pace, no  desat  Min sob  - HCO3  12/06/15  = 30    rec as of 01/21/2016  02 2lpm  Activity and hs

## 2016-01-27 DIAGNOSIS — H52203 Unspecified astigmatism, bilateral: Secondary | ICD-10-CM | POA: Diagnosis not present

## 2016-01-27 DIAGNOSIS — H5203 Hypermetropia, bilateral: Secondary | ICD-10-CM | POA: Diagnosis not present

## 2016-01-27 DIAGNOSIS — H524 Presbyopia: Secondary | ICD-10-CM | POA: Diagnosis not present

## 2016-01-27 DIAGNOSIS — H2513 Age-related nuclear cataract, bilateral: Secondary | ICD-10-CM | POA: Diagnosis not present

## 2016-01-31 ENCOUNTER — Other Ambulatory Visit: Payer: Self-pay | Admitting: Family Medicine

## 2016-01-31 ENCOUNTER — Telehealth: Payer: Self-pay | Admitting: Internal Medicine

## 2016-01-31 DIAGNOSIS — J449 Chronic obstructive pulmonary disease, unspecified: Secondary | ICD-10-CM

## 2016-01-31 NOTE — Telephone Encounter (Signed)
Fine with me

## 2016-01-31 NOTE — Telephone Encounter (Signed)
Tammy with [facility] called today regarding patients temazepam (RESTORIL) 15 MG . Apparently he is out of the medication, he does not know what happened to the medication and would like to know if he can get a new script. Please advise.   Tammy contact: [phone]

## 2016-01-31 NOTE — Telephone Encounter (Signed)
Spoke with Tammy with [facility] who is requesting a POC for pt. She states pt seems a little down about not being able to leave his house due to his O2 tank.  MW please advise if okay to place order to [facility] to eval pt for POC. Thanks.

## 2016-01-31 NOTE — Telephone Encounter (Signed)
Spoke with Tammy, aware that we are placing this order for POC eval.  Nothing further needed.

## 2016-02-01 ENCOUNTER — Other Ambulatory Visit: Payer: Self-pay | Admitting: Cardiology

## 2016-02-01 DIAGNOSIS — I10 Essential (primary) hypertension: Secondary | ICD-10-CM

## 2016-02-03 NOTE — Telephone Encounter (Signed)
Tammy with [facility] called today regarding patients temazepam (RESTORIL) 15 MG . Apparently he is out of the medication, he does not know what happened to the medication and would like to know if he can get a new script.  Last fill 12/09/15 #60 0. Last ov 12/06/15. Please advise.

## 2016-02-03 NOTE — Telephone Encounter (Signed)
Ok to refill x1  2 refills

## 2016-02-04 MED ORDER — TEMAZEPAM 15 MG PO CAPS: 30.0000 mg | ORAL_CAPSULE | Freq: Every evening | ORAL | 0 refills | Status: DC | PRN

## 2016-02-04 NOTE — Telephone Encounter (Signed)
Rx called to [facility] at [facility]. Notified Tammy at [facility].

## 2016-02-19 ENCOUNTER — Other Ambulatory Visit: Payer: Self-pay | Admitting: Family Medicine

## 2016-02-19 DIAGNOSIS — F411 Generalized anxiety disorder: Secondary | ICD-10-CM

## 2016-02-20 NOTE — Telephone Encounter (Signed)
Last ov 12/06/15. Last fill 12/20/15 #90 0. Please advise.

## 2016-02-21 ENCOUNTER — Other Ambulatory Visit: Payer: Self-pay | Admitting: Family Medicine

## 2016-02-21 ENCOUNTER — Other Ambulatory Visit: Payer: Self-pay

## 2016-02-21 NOTE — Telephone Encounter (Signed)
Refill sent per [facility] refill protocol/SLS  

## 2016-02-24 ENCOUNTER — Telehealth: Payer: Self-pay | Admitting: Family Medicine

## 2016-02-24 ENCOUNTER — Telehealth: Payer: Self-pay

## 2016-02-24 NOTE — Telephone Encounter (Signed)
Faxed Rx Xanax to pharmacy, per patient request. LB

## 2016-02-24 NOTE — Telephone Encounter (Signed)
Patient called stating that the pharmacy does not have his "paperwork" for his ALPRAZolam (XANAX) 0.5 MG tablet. He is getting frustrated that it is not done yet and he hasn't had his medication in 4 days. Please advise.

## 2016-02-24 NOTE — Telephone Encounter (Signed)
Rx printed and faxed. LB

## 2016-02-24 NOTE — Telephone Encounter (Signed)
Rx faxed. LB 

## 2016-02-24 NOTE — Telephone Encounter (Signed)
Pt aware.

## 2016-02-24 NOTE — Telephone Encounter (Signed)
-----   Message from Algernon Huxley, RN sent at 08/27/2015 10:06 AM EDT ----- Regarding: Lab Pt needs cbc, order in epic.

## 2016-03-13 ENCOUNTER — Other Ambulatory Visit: Payer: Self-pay | Admitting: Family Medicine

## 2016-03-13 NOTE — Telephone Encounter (Signed)
Last ov 12/06/15. Last fill 09/13/15 #30 5. Please Advise. LB

## 2016-03-24 ENCOUNTER — Other Ambulatory Visit: Payer: Self-pay | Admitting: Family Medicine

## 2016-04-20 ENCOUNTER — Other Ambulatory Visit: Payer: Self-pay | Admitting: Family Medicine

## 2016-04-20 DIAGNOSIS — F411 Generalized anxiety disorder: Secondary | ICD-10-CM

## 2016-04-21 NOTE — Telephone Encounter (Signed)
Faxed hardcopy for Alprazolam to [facility]

## 2016-04-21 NOTE — Telephone Encounter (Signed)
Requesting:   Alprazolam Contract    02/12/2014 UDS    None Last OV    08/13/2015--Next scheduled appt. In 06/04/2016 Last Refill    #90 with  1 refills on  02/24/2016  Please Advise

## 2016-04-23 ENCOUNTER — Other Ambulatory Visit (INDEPENDENT_AMBULATORY_CARE_PROVIDER_SITE_OTHER): Payer: Medicare Other

## 2016-04-23 ENCOUNTER — Other Ambulatory Visit: Payer: Self-pay

## 2016-04-23 ENCOUNTER — Encounter: Payer: Self-pay | Admitting: Internal Medicine

## 2016-04-23 ENCOUNTER — Ambulatory Visit (INDEPENDENT_AMBULATORY_CARE_PROVIDER_SITE_OTHER): Payer: Medicare Other | Admitting: Internal Medicine

## 2016-04-23 VITALS — BP 140/74 | HR 95 | Ht 62.0 in | Wt 159.0 lb

## 2016-04-23 DIAGNOSIS — D509 Iron deficiency anemia, unspecified: Secondary | ICD-10-CM | POA: Diagnosis not present

## 2016-04-23 DIAGNOSIS — J449 Chronic obstructive pulmonary disease, unspecified: Secondary | ICD-10-CM

## 2016-04-23 DIAGNOSIS — J9612 Chronic respiratory failure with hypercapnia: Secondary | ICD-10-CM

## 2016-04-23 DIAGNOSIS — J9611 Chronic respiratory failure with hypoxia: Secondary | ICD-10-CM | POA: Diagnosis not present

## 2016-04-23 DIAGNOSIS — D649 Anemia, unspecified: Secondary | ICD-10-CM

## 2016-04-23 LAB — CBC WITH DIFFERENTIAL/PLATELET
BASOS ABS: 0 10*3/uL (ref 0.0–0.1)
Basophils Relative: 0.4 % (ref 0.0–3.0)
Eosinophils Absolute: 0.1 10*3/uL (ref 0.0–0.7)
Eosinophils Relative: 1.1 % (ref 0.0–5.0)
HCT: 41.6 % (ref 39.0–52.0)
HEMOGLOBIN: 14.2 g/dL (ref 13.0–17.0)
Lymphocytes Relative: 5.3 % — ABNORMAL LOW (ref 12.0–46.0)
Lymphs Abs: 0.7 10*3/uL (ref 0.7–4.0)
MCHC: 34 g/dL (ref 30.0–36.0)
MCV: 96.8 fl (ref 78.0–100.0)
MONO ABS: 1.1 10*3/uL — AB (ref 0.1–1.0)
Monocytes Relative: 8.8 % (ref 3.0–12.0)
NEUTROS PCT: 84.4 % — AB (ref 43.0–77.0)
Neutro Abs: 10.9 10*3/uL — ABNORMAL HIGH (ref 1.4–7.7)
Platelets: 235 10*3/uL (ref 150.0–400.0)
RBC: 4.3 Mil/uL (ref 4.22–5.81)
RDW: 14.1 % (ref 11.5–15.5)
WBC: 12.9 10*3/uL — AB (ref 4.0–10.5)

## 2016-04-23 NOTE — Assessment & Plan Note (Signed)
-   PFT's 04/28/2013  FEV1 0.88 (40%) with ratio 44 and 14% better p B2 dlco 45 corrects to 83 - Trial of breo 04/28/2013 > improved symptoms  06/09/2013  - spirometry 06/04/2014 FEV1  0.76 (29%) ratio 45  -01/10/2015   trial of stiolto > insurance declined  - PFT's  02/22/2015  FEV1 0.84 (40 % ) ratio 41  p 5 % improvement from saba with DLCO  40 % corrects to 51 % for alv volume   - 02/22/15  rec rehab/ declined - 05/24/2015  extensive coaching HFA effectiveness =    75% > try change anoro to BEVESPI  - multilple admits > tried on daliresp > no better so 08/23/2015 rec d/c when present bottle used up   - 08/23/2015 pred rx daily 20 if doing poor/ 10 if doing better  -med calendar 09/06/2015  - 12/09/2015  After extensive coaching HFA effectiveness =    75% (short Ti)  - Lonhala trial 04/23/2016 >>>   Since he is neb /pred dep rec try change to LAMA also by neb/ free samples for now and then regroup with all meds in hand using a trust but verify approach to confirm accurate Medication  Reconciliation The principal here is that until we are certain that the  patients are doing what we've asked, it makes no sense to ask them to do more.      Each maintenance medication was reviewed in detail including most importantly the difference between maintenance and as needed and under what circumstances the prns are to be used. This was done in the context of a medication calendar review which provided the patient with a user-friendly unambiguous mechanism for medication administration and reconciliation and provides an action plan for all active problems. It is critical that this be shown to every doctor  for modification during the office visit if necessary so the patient can use it as a working document.

## 2016-04-23 NOTE — Assessment & Plan Note (Signed)
HC03  12/17/14  32  01/10/2015   Walked RA  2 laps @ 185 ft each stopped due to  Sob with sat 89% at nl pace - 05/24/2015  Walked 2lpm x 3 laps @ 185 ft each stopped due to  End of study, nl pace, no  desat  Min sob  - HCO3  12/06/15  = 30   rec as of 04/23/2016  02 2lpm  d hs and with activity as needed - ok resting sats RA

## 2016-04-23 NOTE — Patient Instructions (Signed)
When lonhala arrives start taking it twice daily after the budesonide/foromoterol neb and stop the INCRUSE as per med calendar   See calendar for specific medication instructions and bring it back for each and every office visit for every healthcare provider you see.  Without it,  you may not receive the best quality medical care that we feel you deserve.  You will note that the calendar groups together  your maintenance  medications that are timed at particular times of the day.  Think of this as your checklist for what your doctor has instructed you to do until your next evaluation to see what benefit  there is  to staying on a consistent group of medications intended to keep you well.  The other group at the bottom is entirely up to you to use as you see fit  for specific symptoms that may arise between visits that require you to treat them on an as needed basis.  Think of this as your action plan or "what if" list.   Separating the top medications from the bottom group is fundamental to providing you adequate care going forward.    See Tammy NP w/in 2 weeks with your med calendar  all your medications, even over the counter meds, separated in two separate bags, the ones you take no matter what vs the ones you stop once you feel better and take only as needed when you feel you need them.   Tammy  will generate for you a new user friendly medication calendar that will put [place] all on the same page re: your medication use.

## 2016-04-23 NOTE — Progress Notes (Signed)
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 acei cough   History of Present Illness  03/27/2013 1st [facility] office visit/ Ronald Bell still active smoker cc recurrent episodes of severe refractory cough this episode started x 3 weeks prior to OV   but comes and goes since early fall 2014 assoc with doe x  walking garbage to street x 3 years better up to several hours after combivent on tudorza bid miant as well.  Cough is variably prod of thick yellow mucus but min amt, mostly prod in am and dry rest of the day.  Has noct choking spells just about every night p lies down better if sits up and takes combivent - they feel almost  like something strangling him.  rec Stop lisinopril Benicar 20/12.5 one daily  Pantoprazole (protonix) 40 mg   Take 30-60 min before first meal of the day and Pepcid ac 20 mg one bedtime until return to office   The key is to stop smoking completely before smoking completely stops you!    04/04/2013  Acute  ov/Ronald Bell re: refractory cough  Chief Complaint  Patient presents with  . Acute Visit    Pt c/o increased cough since last visit. Cough is non prod and worse at night and early am. He states that he gets SOB with coughing spells and is using combivent 5 times daily. He states had episode of syncope last night and EMS was called.    really not sob unless coughing. No excess mucus, present 24/7  rec Stop tudorza Prednisone 10 mg take  4 each am x 2 days,   2 each am x 2 days,  1 each am x 2 days and stop  mucinex dm 1200 twice daily as long as you feel the urge to cough and supplement it with Tramadol 50 up to 2 to every 4 hours- once the cough is gone back off of it. Prednisone 10 mg take  4 each  am x 2 days,   2 each am x 2 days,  1 each am x 2 days and stop  Pantoprazole (protonix) 40 mg   Take 30-60 min before first meal of the day and Pepcid 20 mg one bedtime until return to office - this is the best way to tell whether stomach acid is contributing to your problem.   GERD diet   04/28/2013 f/u ov/Ronald Bell re: GOLD III COPD / cough much better off ACEi Chief Complaint  Patient presents with  . Follow-up    Pt states breathing is slightly improved and cough is much improved. He is using combivent approx 4 times per day.   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 Acute Visit    Pt c/o increased SOB off and on since Feb 2016. He states that he wakes up in the night feeling out of breath and gets SOB walking from room to room at home. He is using combivent 5-6 x per day.    sleeping propped up now using noct combivent as well as q am breo   Cancelled ov with Ronald [name] today "I wanted to get my breathing right first" rec Prednisone 10 mg take  4 each am x 2 days,   2 each am x 2 days,  1 each am x 2 days and stop  Add pepcid 20 mg at bedtime (over the counter) Prop up as much as possible at bedtime  No change Breo each am combivent up to 1 puff every 6 hours as needed  Keep appt with T  surgery    Last T surgery note  12/24/14  Patient returns for routine follow-up up approximately one month status post transcatheter aortic valve replacement using a 26 mm Edwards Sapien XT transcatheter heart valve placed via trans-apical approach on 11/20/2014. His early postoperative recovery in the hospital was notable for paroxysmal atrial fibrillation for which he was treated with amiodarone. He was discharged from the hospital on the seventh postoperative day in sinus rhythm. Because of his history of recurrent GI bleeding in the past he was not anticoagulated using warfarin. Dual antiplatelets therapy using aspirin and Plavix was resumed because of recent PCI and stenting of the right coronary artery. He was readmitted to the hospital one week later with melena and symptomatic anemia with hemoglobin 5.1. He was transfused a total of 5 units packed red blood cells and underwent both upper and lower endoscopy with ablation of multiple AVMs. During his hospitalization he also underwent left thoracentesis for a moderate sized left pleural effusion. He was eventually discharged home and he returns to our office today for routine follow-up. He was seen in follow-up earlier today by Ronald. Henrene Pastor. He has not had any further signs of GI bleeding and his hemoglobin has remained stable 11.5. The patient states that he is still limited by exertional shortness of breath that is multifactorial and related to his underlying COPD and chronic diastolic congestive heart failure. He has not had any chest pain or chest tightness. Appetite is good. Home health physical therapy has been working with him and he has been making slow but steady progress. He does not wish to participate in outpatient cardiac rehabilitation program.    01/10/2015  Ext f/u  ov/Ronald Bell re:  Re-establish care post op COPD GOLD III on Breo and prn combivent  Chief Complaint  Patient presents with  . Follow-up    COPD f/u pt. states breathing has  wrosen. SOB with activity. no wheezing. no cough. no chest pain/tightness. on 3L O2 pulse.   Can walk up and back driveway RA  2 x then checks 02 sats 88-89% then walks 2 more  Times was easier before surgery and not on 02 at that point  but still on BREO Finished amio one week prior to OV   Wearing 02 hs  3lpm  rec Stop breo and  combivent start stiolto 2 pffs each am  Only use your albuterol (proair) as a rescue medication 02  2lpm sleeping and walking  No need for 02 inside the house with activties during the day  unless excercising  Prednisone 10 mg take  4 each am x 2 days,   2 each am x 2 days,  1 each am x 2 days and stop  Please schedule a follow up office visit in 6 weeks, call sooner if needed with pfts    02/22/2015  f/u ov/Eulice Rutledge re: GOLD III copd/ still smoking on anoro / alb 2 puffs around lunch time  Chief Complaint  Patient presents with  . Follow-up    pt following fore COPD with PFT today: pt c/o the regular SOB, and dry cough at times. no c/o wheezing or chest tightness.   walking up to 4 min back and forth to mb on 2lpm   rec Continue Anoro one click each am   Only use your albuterol (proair) as a rescue medication  02  2lpm sleeping and walking  No need for 02 inside the house with activties during the day  unless excercising   Admit date: 05/18/2015 Discharge date: 05/20/2015  Discharge Diagnoses:   COPD exacerbation  Hyperlipidemia  Iron deficiency anemia  Essential hypertension  GERD  Aortic stenosis, severe  Former smoker  S/P TAVR (transcatheter aortic valve replacement)  CAD (coronary artery disease)  Depression  Acute on chronic respiratory failure (HCC)  Elevated lactic acid level   05/24/2015  Extended transition of care  ov/Ronon Ferger re: COPD  GOLD III criteria/ 02 rx 2lpm with activity and hs   Chief Complaint  Patient presents with  . Follow-up    Recently admitted to hospital from 05/18/15-03/18/16 with COPD flare. He states had to to  back to ED on 05/21/15 with SOB and had neg CT Angio. He states that his breathing seems better today and breathing is back at baseline.   on ppi pc and anoro plus freq saba in multiple forms  rec Plan A = Automatic = stop Anoro and try BEVESPI  Take 2 puffs first thing in am and then another 2 puffs about 12 hours later.                                     Change protonix to 40mg  Take 30-60 min before first meal of the day and add Pepcid ac 20 mg at bedtime  Plan B = Backup Only use your albuterol as a rescue medication Plan C = Crisis - only use your albuterol nebulizer if you first try Plan B and it fails to help > ok to use the nebulizer up to every 4 hours but if start needing it regularly call for immediate appointment Plan D = Doctor - call me if B and C not adequate Plan E = ER - go to ER or call 911 if all else fails       01/21/2016  f/u ov/Caddie Randle re: copd III/ 02 hs and prn/ maint maint brov/bud/incruse and pred 10 mg daily  Chief Complaint  Patient presents with  . Follow-up    Today is a bad day with his breathing. He has noticed some wheezing in the am after he uses neb- lies down for a few min and it goes away.     Doe =  MMRC4  = sob if tries to leave home or while getting dressed   rec See calendar for specific medication instructions    I will send Ronald Marlou Porch a message re your follow up    04/23/2016  f/u ov/Perlita Forbush re:  COPD III/ 02 dep at hs/ maint brov/bud/incruse and prednisone 10 mg daily  Chief Complaint  Patient presents with  . Follow-up    60mo rov. pt states breathing is up & down. pt c/o sob with exertion & wheezing mainly in the morning after neb treatment.   incruse costing 130 a month and prefers nebs  Doe still mmrc = 4  avg use of saba hfa once per day around noon, never needs duoneb  No obvious patterns in day to day or daytime variability or assoc excess/ purulent sputum or mucus plugs or hemoptysis or cp or chest tightness, overt sinus or hb symptoms. No  unusual exp hx or h/o childhood pna/ asthma or knowledge of premature birth.  Sleeping ok without nocturnal  or early am exacerbation  of respiratory  c/o's or need for noct saba. Also denies any obvious fluctuation of symptoms with weather or environmental changes or other aggravating or alleviating factors except as outlined above   Current Medications, Allergies, Complete Past Medical History, Past Surgical History, Family History, and Social History were reviewed in [employer] record.  ROS  The following are not active complaints unless bolded sore throat, dysphagia, dental problems, itching, sneezing,  nasal congestion or excess/ purulent secretions, ear ache,   fever, chills, sweats, unintended wt loss, classically pleuritic or exertional cp,  orthopnea pnd or leg swelling resolved , presyncope, palpitations, abdominal pain, anorexia, nausea, vomiting, diarrhea  or change in bowel or bladder habits, change in stools or urine, dysuria,hematuria,  rash, arthralgias, visual complaints, headache, numbness, weakness or ataxia or problems with walking or coordination,  change in mood/affect or memory.                    Objective:   Physical Exam    amb wm nad    / vital signs reviewed / note sats 90% on arrival on RA   06/04/2014        137 > 01/10/2015  136 > 02/22/2015  138 > 05/24/2015  135 > 08/23/2015    134  > 12/09/2015  141 > 01/21/2016  148> 04/23/2016  159     HEENT mild turbinate edema.  Oropharynx no thrush or excess pnd or cobblestoning.  No JVD or cervical adenopathy. Mild accessory muscle hypertrophy. Trachea midline, nl thryroid. Chest was hyperinflated by percussion with diminished breath sounds and moderate increased exp time no wheeze at all   Hoover sign positive at mid inspiration. Regular rate and rhythm with  II=III /VI sem no  gallop or rub or increase P2  -  No longer any edema  Abd: no hsm, nl excursion. Ext warm without cyanosis or clubbing.       CXR PA and Lateral:   12/09/2015 :    I personally reviewed images and agree with radiology impression as follows:   Persistent evidence of prior asbestos exposure. Scarring anterior left mid lung. No frank edema or consolidation. Status post aortic valve replacement. Aortic atherosclerosis noted.

## 2016-04-24 ENCOUNTER — Other Ambulatory Visit: Payer: Self-pay | Admitting: Family Medicine

## 2016-04-24 ENCOUNTER — Telehealth: Payer: Self-pay | Admitting: Internal Medicine

## 2016-04-24 NOTE — Telephone Encounter (Signed)
Called the [facility] and the lonhala is the name of the program and not the medication that the pt will be receiving.  This program will be up and running in about 2 weeks. Nothing further is needed.

## 2016-04-30 ENCOUNTER — Encounter: Payer: Self-pay | Admitting: Cardiology

## 2016-05-04 ENCOUNTER — Ambulatory Visit: Payer: Medicare Other | Admitting: Cardiology

## 2016-05-11 ENCOUNTER — Telehealth: Payer: Self-pay | Admitting: Internal Medicine

## 2016-05-11 NOTE — Telephone Encounter (Signed)
Attempted to call [facility]. No representative came to the line. The line then went dead. Will try back.

## 2016-05-12 NOTE — Telephone Encounter (Signed)
lmomtcb x 1 for [facility] to call back.

## 2016-05-14 ENCOUNTER — Telehealth: Payer: Self-pay | Admitting: Acute Care

## 2016-05-14 NOTE — Telephone Encounter (Signed)
Called the pharmacy and they stated that they needed an updated med list. This has been faxed to the number given. Nothing further is needed.

## 2016-05-14 NOTE — Telephone Encounter (Signed)
I spoke with pt  to re attempt to scheduled SD[facility] and CT per referral from DR Carollee Herter.  Pt states that he is not interested in participating in the program.  Referral has been cancelled and letter has been sent to Dr Etter Sjogren- Cheri Rous.

## 2016-05-20 ENCOUNTER — Ambulatory Visit: Payer: Medicare Other | Admitting: Cardiology

## 2016-05-22 ENCOUNTER — Other Ambulatory Visit: Payer: Self-pay | Admitting: Internal Medicine

## 2016-05-22 ENCOUNTER — Other Ambulatory Visit: Payer: Self-pay | Admitting: Family Medicine

## 2016-06-04 ENCOUNTER — Telehealth: Payer: Self-pay | Admitting: Family Medicine

## 2016-06-04 ENCOUNTER — Ambulatory Visit: Payer: Medicare Other | Admitting: Family Medicine

## 2016-06-04 NOTE — Telephone Encounter (Signed)
No charge. 

## 2016-06-04 NOTE — Telephone Encounter (Signed)
Patient lvm at 7:43am today cancelling 11am appointment today due to patient not feeling well, charge or no charge

## 2016-06-12 ENCOUNTER — Other Ambulatory Visit: Payer: Self-pay | Admitting: Family Medicine

## 2016-06-12 DIAGNOSIS — F411 Generalized anxiety disorder: Secondary | ICD-10-CM

## 2016-06-12 NOTE — Telephone Encounter (Signed)
Last office visit 12/06/2015----next office visit 07/14/2016 Last refill on 04/21/2016  #90 with 1 refill Contract signed on 02/12/2014/no UDS

## 2016-06-12 NOTE — Telephone Encounter (Signed)
Faxed hardcopy for Alprazolam to [facility] [place]

## 2016-06-15 DIAGNOSIS — J449 Chronic obstructive pulmonary disease, unspecified: Secondary | ICD-10-CM | POA: Diagnosis not present

## 2016-06-15 DIAGNOSIS — I25110 Atherosclerotic heart disease of native coronary artery with unstable angina pectoris: Secondary | ICD-10-CM | POA: Diagnosis not present

## 2016-06-15 DIAGNOSIS — I6529 Occlusion and stenosis of unspecified carotid artery: Secondary | ICD-10-CM | POA: Diagnosis not present

## 2016-06-15 DIAGNOSIS — I350 Nonrheumatic aortic (valve) stenosis: Secondary | ICD-10-CM | POA: Diagnosis not present

## 2016-06-15 DIAGNOSIS — I4891 Unspecified atrial fibrillation: Secondary | ICD-10-CM | POA: Diagnosis not present

## 2016-06-15 DIAGNOSIS — I10 Essential (primary) hypertension: Secondary | ICD-10-CM | POA: Diagnosis not present

## 2016-06-15 DIAGNOSIS — K219 Gastro-esophageal reflux disease without esophagitis: Secondary | ICD-10-CM | POA: Diagnosis not present

## 2016-06-16 ENCOUNTER — Ambulatory Visit (INDEPENDENT_AMBULATORY_CARE_PROVIDER_SITE_OTHER): Payer: Medicare Other | Admitting: Cardiology

## 2016-06-16 ENCOUNTER — Encounter: Payer: Self-pay | Admitting: Cardiology

## 2016-06-16 ENCOUNTER — Encounter (INDEPENDENT_AMBULATORY_CARE_PROVIDER_SITE_OTHER): Payer: Self-pay

## 2016-06-16 VITALS — BP 138/56 | HR 72 | Ht 62.5 in | Wt 155.1 lb

## 2016-06-16 DIAGNOSIS — I5022 Chronic systolic (congestive) heart failure: Secondary | ICD-10-CM | POA: Diagnosis not present

## 2016-06-16 DIAGNOSIS — I2510 Atherosclerotic heart disease of native coronary artery without angina pectoris: Secondary | ICD-10-CM | POA: Diagnosis not present

## 2016-06-16 DIAGNOSIS — I350 Nonrheumatic aortic (valve) stenosis: Secondary | ICD-10-CM | POA: Diagnosis not present

## 2016-06-16 DIAGNOSIS — Z952 Presence of prosthetic heart valve: Secondary | ICD-10-CM | POA: Diagnosis not present

## 2016-06-16 DIAGNOSIS — I2583 Coronary atherosclerosis due to lipid rich plaque: Secondary | ICD-10-CM

## 2016-06-16 MED ORDER — BISOPROLOL FUMARATE 5 MG PO TABS: 2.5000 mg | ORAL_TABLET | Freq: Every day | ORAL | 6 refills | Status: DC

## 2016-06-16 NOTE — Patient Instructions (Signed)
Medication Instructions:  Please stop your Metoprolol and start Bisoprolol 5 mg 1/2 tablet a day. Continue all other medications as listed.  Testing/Procedures: Your physician has requested that you have an echocardiogram. Echocardiography is a painless test that uses sound waves to create images of your heart. It provides your doctor with information about the size and shape of your heart and how well your heart's chambers and valves are working. This procedure takes approximately one hour. There are no restrictions for this procedure.  Follow-Up: Follow up in 6 months with Dr. Marlou Porch.  You will receive a letter in the mail 2 months before you are due.  Please call us when you receive this letter to schedule your follow up appointment.  Thank you for choosing [facility]!!

## 2016-06-16 NOTE — Progress Notes (Signed)
Cardiology Office Note   Date:  06/16/2016   ID:  Armanie, Martine 01-13-1941, MRN [id number]  PCP:  Ann Held, DO  Cardiologist:   Candee Furbish, MD       History of Present Illness: Ronald Bell is a 76 y.o. male who presents for follow-up of severe aortic stenosis status post transcatheter aortic valve replacement using 26 mm Edwards heart valve placed via transapical approach (TAVR). Paroxysmal atrial fibrillation was noted and treated with amiodarone briefly but this was stopped especially given his underlying lung disease. He was not anticoagulated because of recurrent GI bleeding. Recent right coronary artery PCI and stenting with aspirin and Plavix noted. One week later, he was admitted to the hospital again with melena and hemoglobin was 5.1. He was transfused 5 units of blood. Endoscopic ablation with multiple AVMs noted. He had a left thoracentesis as well for moderate size left pleural effusion. Hemoglobin has remained stable at 11.5. He also saw Dr. [name] of pulmonary medicine. On O2 at night or walking.   Currently he is only on low-dose aspirin.   He is maintaining normal rhythm.  He also had 4 separate hospitalizations in May for apparent COPD exacerbations. BNP was 58.  More concerning however is his increased transvalvular gradient and velocity, 26 mm Sapien Valve.   Because of this increase in transvalvular gradient, he eventually underwent a transesophageal echocardiogram which showed 4 m/s peak velocity and mean gradient of 38 mmHg across the bioprosthetic valve. 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 This was discussed with Dr. Roxy Manns and I discussed with him the possibility of anticoagulation in case valvular malfunction was a result of thrombus formation inhibiting proper valve function. After discussion with Ronald Bell , he was quite adamant about not starting anticoagulation/warfarin for fear of severe bleeding. He had almost  died once before with a severe degree of blood loss from AVMs and he was not willing to risk that once again.  Since then, he has been seeing Dr. [name] quite frequently in pulmonary clinic. His breathing has not seemed to worsen he is still quite frustrated after the transapical TAVR. We spent a lengthy time in clinic today discussing the procedure and the rationale behind the procedure and the hope that perhaps improving the aortic stenosis would help him with his overall symptomatology but unfortunately he has not felt this way. His wife is also quite frustrated at his situation and at times during our encounter they would have a verbal altercation.  Unfortunately, he would not be a candidate obviously for repeat procedure nor would he ever want to undergo another transapical TAVR.  Since his symptoms seem to have somewhat stabilized, I think it is prudent to check an echocardiogram again to see where his valvular gradients currently reside.    Past Medical History:  Diagnosis Date  . Angiodysplasia of intestine with hemorrhage    large and SB, gastric AVMs.   . Anxiety   . Arthritis    "left shoulder" (10/19/2014)  . Carotid artery occlusion   . Carotid artery stenosis 04/22/2012  . COPD GOLD III with min reversibilty  08/11/2006   Followed in Pulmonary clinic/ [facility]/ [place] - PFT's 04/28/2013  FEV1 0.88 (40%) with ratio 44 and 14% better p B2 dlco 45 corrects to 83 - Trial of breo 04/28/2013 > improved symptoms  06/09/2013  - spirometry 06/04/2014 FEV1  0.76 (29%) ratio 45      . Coronary artery disease   .  GERD 11/30/2008   Qualifier: Diagnosis of  By: Marijean Niemann CMA, Danielle    . GERD (gastroesophageal reflux disease)   . GI bleed 2010   4 units PRBCs  . History of blood transfusion "couple times"   "related to bleeding in colon and esophagus"  . Hx of adenomatous colonic polyps 2012, 2013.   Marland Kitchen Hyperlipidemia   . Hypertension   . Iron deficiency anemia 01/25/2009   Qualifier:  Diagnosis of  By: Henrene Pastor MD, Docia Chuck   . Irregular heartbeat   . On home oxygen therapy    "2L at night" (07/23/2015)  . Peripheral vascular disease ([place])   . Pleural plaque with presence of asbestos 03/27/2013   Followed in Pulmonary clinic/ [facility]/ [place] - F/u CT 09/08/2013 1. Stable extensive calcified pleural plaque formation consistent with asbestos exposure. 2. Multiple pulmonary nodules are unchanged from the CT of 6 months ago. Given risk factors for lung cancer, continued follow up is recommended with chest CT in 6 months> done 04/20/14 no change >repeat in 12 m in tickle file     . Polyp of nasal cavity   . PVD (peripheral vascular disease) ([place]) 10/18/2012  . Rosacea   . 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 Shingles   . Tobacco abuse     Past Surgical History:  Procedure Laterality Date  . CARDIAC CATHETERIZATION  2001; 06/28/2014  . CARDIAC CATHETERIZATION N/A 10/19/2014   Procedure: Coronary Stent Intervention;  Surgeon: Burnell Blanks, MD;  Location: [place] [facility];  Service: Cardiovascular;  Laterality: N/A;  BMS Mid RCA  . COLONOSCOPY  July 2015   Dr. Henrene Pastor  . COLONOSCOPY N/A 08/17/2014   Procedure: COLONOSCOPY;  Surgeon: Jerene Bears, MD;  Location: [facility] ENDOSCOPY;  Service: Endoscopy;  Laterality: N/A;  . COLONOSCOPY N/A 12/05/2014   Procedure: COLONOSCOPY;  Surgeon: Manus Gunning, MD;  Location: [place];  Service: Gastroenterology;  Laterality: N/A;  . ENTEROSCOPY N/A 12/05/2014   Procedure: ENTEROSCOPY;  Surgeon: Manus Gunning, MD;  Location: [facility] ENDOSCOPY;  Service: Gastroenterology;  Laterality: N/A;  . ESOPHAGOGASTRODUODENOSCOPY  2012   normal  . ESOPHAGOGASTRODUODENOSCOPY N/A 08/17/2014   Procedure: ESOPHAGOGASTRODUODENOSCOPY (EGD);  Surgeon: Jerene Bears, MD;  Location: [facility] ENDOSCOPY;  Service: Endoscopy;  Laterality: N/A;  .  ILIAC ARTERY STENT Left 2005   CIA  . KNEE ARTHROSCOPY WITH MEDIAL MENISECTOMY Left 03/08/2014   Procedure: LEFT KNEE SCOPE WITH MEDIAL MENISECTOMY AND CHONDROPLASTY;  Surgeon: Ninetta Lights, MD;  Location: [place];  Service: Orthopedics;  Laterality: Left;  . LEFT AND RIGHT HEART CATHETERIZATION WITH CORONARY ANGIOGRAM N/A 06/28/2014   Procedure: LEFT AND RIGHT HEART CATHETERIZATION WITH CORONARY ANGIOGRAM;  Surgeon: Jerline Pain, MD;  Location: [facility] CATH LAB;  Service: Cardiovascular;  Laterality: N/A;  . RIB PLATING Left 11/20/2014   Procedure: RIB PLATING OF LEFT 8TH RIB;  Surgeon: [name], MD;  Location: [place];  Service: Open Heart Surgery;  Laterality: Left;  . TEE WITHOUT CARDIOVERSION N/A 11/20/2014   Procedure: TRANSESOPHAGEAL ECHOCARDIOGRAM (TEE);  Surgeon: [name], MD;  Location: [place];  Service: Open Heart Surgery;  Laterality: N/A;  . TEE WITHOUT CARDIOVERSION N/A 12/19/2015   Procedure: TRANSESOPHAGEAL ECHOCARDIOGRAM (TEE);  Surgeon: Jerline Pain, MD;  Location: [facility] ENDOSCOPY;  Service: Cardiovascular;  Laterality: N/A;  . TONSILLECTOMY    . TRANSCATHETER AORTIC VALVE REPLACEMENT, TRANSAPICAL  N/A 11/20/2014   Procedure: TRANSCATHETER AORTIC VALVE REPLACEMENT, TRANSAPICAL;  Surgeon: [name], MD;  Location: [place];  Service: Open Heart Surgery;  Laterality: N/A;     Current Outpatient Prescriptions  Medication Sig Dispense Refill  . albuterol (PROVENTIL HFA;VENTOLIN HFA) 108 (90 Base) MCG/ACT inhaler Inhale 2 puffs into the lungs every 6 (six) hours as needed for wheezing or shortness of breath (Plan B).    . ALPRAZolam (XANAX) 0.5 MG tablet TAKE ONE TABLET BY MOUTH THREE TIMES DAILY AS NEEDED FOR ANXIETY 90 tablet 1  . arformoterol (BROVANA) 15 MCG/2ML NEBU Take 2 mLs (15 mcg total) by nebulization 2 (two) times daily. 120 mL 3  . aspirin 81 MG tablet Take 81 mg by mouth every morning.     Marland Kitchen atorvastatin (LIPITOR) 40 MG tablet Take 1 tablet (40 mg total) by mouth  daily. 90 tablet 1  . budesonide (PULMICORT) 0.5 MG/2ML nebulizer solution Take 2 mLs (0.5 mg total) by nebulization 2 (two) times daily. 240 mL 12  . citalopram (CELEXA) 10 MG tablet TAKE ONE TABLET BY MOUTH ONCE DAILY 30 tablet 2  . docusate sodium (COLACE) 100 MG capsule Take 200 mg by mouth daily.     . famotidine (PEPCID) 20 MG tablet Take 20 mg by mouth at bedtime.     . fenofibrate 160 MG tablet TAKE ONE TABLET BY MOUTH ONCE DAILY 90 tablet 0  . ferrous sulfate 325 (65 FE) MG EC tablet Take 1 tablet (325 mg total) by mouth 2 (two) times daily. 60 tablet 11  . furosemide (LASIX) 20 MG tablet Take 20 mg by mouth daily.    . Glycopyrrolate (LONHALA MAGNAIR REFILL KIT) 25 MCG/ML SOLN Inhale 1 Dose into the lungs 2 (two) times daily.    Marland Kitchen ipratropium-albuterol (DUONEB) 0.5-2.5 (3) MG/3ML SOLN Take 3 mLs by nebulization every 4 (four) hours as needed (shortness of breath and wheezing plan C).    . Multiple Vitamins-Minerals (CENTRUM SILVER PO) Take 1 tablet by mouth daily.     . OXYGEN Use 2L at bedtime and as needed for shortness of breath    . pantoprazole (PROTONIX) 40 MG tablet Take 1 tablet (40 mg total) by mouth daily. 90 tablet 3  . predniSONE (DELTASONE) 10 MG tablet Take 10 mg by mouth daily with breakfast.    . sodium chloride (OCEAN) 0.65 % SOLN nasal spray Place 1 spray into both nostrils as directed. Use as needed for nasal congestion     . temazepam (RESTORIL) 15 MG capsule Take 2 capsules (30 mg total) by mouth at bedtime as needed for sleep. 60 capsule 0  . bisoprolol (ZEBETA) 5 MG tablet Take 0.5 tablets (2.5 mg total) by mouth daily. 30 tablet 6   No current facility-administered medications for this visit.     Allergies:   Patient has no known allergies.    Social History:  The patient  reports that he quit smoking about 13 months ago. His smoking use included Cigarettes and E-cigarettes. He has a 60.00 pack-year smoking history. He has never used smokeless tobacco. He  reports that he drinks about 8.4 oz of alcohol per week . He reports that he does not use drugs.   Family History:  The patient's family history includes CAD in his daughter; Colitis in his father; Heart disease in his brother; Hyperlipidemia in his brother; Hypertension in his brother and son; Lung disease in his mother.    ROS:  Please see the history of  present illness.   Otherwise, review of systems are positive for none.   All other systems are reviewed and negative.    PHYSICAL EXAM: VS:  BP (!) 138/56   Pulse 72   Ht 5' 2.5" (1.588 m)   Wt 155 lb 1.9 oz (70.4 kg)   SpO2 93%   BMI 27.92 kg/m  , BMI Body mass index is 27.92 kg/m. GEN: Well nourished, well developed, in no acute distress  HEENT: normal  Neck: no JVD, carotid bruits, or masses Cardiac: RRR; 2/6 S murmur, rubs, or gallops,no edema Distant heart sounds Respiratory:  decreased air movement bilaterally, blunted bases no wheezes  GI: soft, nontender, nondistended, + BS MS: no deformity or atrophy  Skin: warm and dry, no rash Neuro:  Strength and sensation are intact Psych: euthymic mood, full affect No significant change  EKG:  None today   Recent Labs: 07/30/2015: B Natriuretic Peptide 68.0 12/06/2015: ALT 21; BUN 18; Creatinine, Ser 0.97; Potassium 4.3; Sodium 139 04/23/2016: Hemoglobin 14.2; Platelets 235.0    Lipid Panel    Component Value Date/Time   CHOL 159 12/06/2015 1108   TRIG 128.0 12/06/2015 1108   TRIG 102 02/26/2006 1044   HDL 63.70 12/06/2015 1108   CHOLHDL 2 12/06/2015 1108   VLDL 25.6 12/06/2015 1108   LDLCALC 69 12/06/2015 1108   LDLDIRECT 114.0 05/09/2015 0951      Wt Readings from Last 3 Encounters:  06/16/16 155 lb 1.9 oz (70.4 kg)  04/23/16 159 lb (72.1 kg)  01/21/16 148 lb (67.1 kg)      Other studies Reviewed: Additional studies/ records that were reviewed today include: Prior lab work reviewed. Review of the above records demonstrates: As above  TTE 11/29/15 - Left  ventricle: The cavity size was normal. Wall thickness was   normal. Systolic function was normal. The estimated ejection   fraction was in the range of 60% to 65%. Features are consistent   with a pseudonormal left ventricular filling pattern, with   concomitant abnormal relaxation and increased filling pressure   (grade 2 diastolic dysfunction). - Aortic valve: A bioprosthesis was present. Valve area (VTI): 0.75   cm^2. Valve area (Vmax): 0.72 cm^2. Valve area (Vmean): 0.69   cm^2. - Mitral valve: There was mild regurgitation.  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 4 m/s velocity-in the moderate to severe range. Ejection fraction was normal. We discussed and he did not wish to empirically try warfarin because of his prior GI bleeding.  ASSESSMENT AND PLAN:  Aortic stenosis  - TAVR 33m Sapien XT 11/20/14  - He is very frustrated with the way that he feels. He is frustrated with how he developed a GI bleed, only needed a thoracentesis, along and repeated hospitalizations for COPD. We had lengthy discussion today in clinic and given the severe nature of his preoperative aortic stenosis, his mortality over the next year was greater than 50%. Although his breathing has not improved, I do feel that without the aortic valve replacement, he may not have survived to this point.  - The transesophageal echocardiogram did show an increased valvular gradient and potential valvular malfunction of the left coronary cusp. Treatment plan possibilities were discussed with Dr. ORoxy Mannsand we discussed the potential for warfarin. He refused to be rechallenged with anticoagulation given his prior devastating GI bleed.  - We will check a transthoracic echocardiogram to  see the current state of his valve.   - Still feeling shortness of breath. Had 4 hospitalizations thought to be secondary to COPD, has  improved with the help of Dr. [name]. Thoracentesis. Prior episode of GI bleeding, hemoglobin of 5.1 but no recent episodes.  - underlying COPD as well. He feels as though his breathing is worse than prior to surgery.  - I discussed with Dr. Roxy Manns as well. TEE was performed and did show possible left coronary cusp valvular malfunction. Transvalvular gradients were quite high 4 m/s, mean of 38 mmHg, unfortunately we discussed potentially using warfarin but he did not wish to use this medication because of his prior GI bleed. He was adamant against it. I could understand his frustration. He is continuing with his aspirin and has not had any further GI bleeding. His symptoms have been fairly stable over the last 3-4 months. They have not progressed.   COPD  - Dr. [name]  - Home oxygen  CAD   - RCA stenting  - Currently on aspirin only  - GI bleed  - Stable without any angina.  GI bleed/AVM  - Aspirin only  - No further recurrent bleeding  Paroxysmal atrial fibrillation postoperatively  - Off of amiodarone now. Doing well.  - Currently maintaining sinus rhythm.  Lasix 20 mg a day.  Hyperlipidemia - On atorvastatin as well as fibrate..     Current medicines are reviewed at length with the patient today.  The patient does not have concerns regarding medicines.   Labs/ tests ordered today include: as above   Orders Placed This Encounter  Procedures  . ECHOCARDIOGRAM COMPLETE     Disposition:    follow-up  Signed, Candee Furbish, MD  06/16/2016 6:00 PM    [address], [place], [place]  [address] Phone: [phone]; Fax: [phone]

## 2016-06-19 ENCOUNTER — Other Ambulatory Visit: Payer: Self-pay | Admitting: Family Medicine

## 2016-06-19 ENCOUNTER — Other Ambulatory Visit: Payer: Self-pay | Admitting: Family

## 2016-06-22 NOTE — Telephone Encounter (Signed)
Faxed hardcopy for [employer] to [place]

## 2016-06-22 NOTE — Telephone Encounter (Signed)
Last office visit on 12/06/2015---next future appt 07/14/16 Last refill on 02/04/2016 #60 no refills Contract on 02/12/2014 no UDS

## 2016-07-14 ENCOUNTER — Encounter: Payer: Self-pay | Admitting: Family Medicine

## 2016-07-14 ENCOUNTER — Ambulatory Visit (INDEPENDENT_AMBULATORY_CARE_PROVIDER_SITE_OTHER): Payer: Medicare Other | Admitting: Family Medicine

## 2016-07-14 VITALS — BP 148/68 | HR 73 | Ht 63.0 in | Wt 154.0 lb

## 2016-07-14 DIAGNOSIS — I4891 Unspecified atrial fibrillation: Secondary | ICD-10-CM | POA: Diagnosis not present

## 2016-07-14 DIAGNOSIS — E782 Mixed hyperlipidemia: Secondary | ICD-10-CM

## 2016-07-14 DIAGNOSIS — D500 Iron deficiency anemia secondary to blood loss (chronic): Secondary | ICD-10-CM

## 2016-07-14 DIAGNOSIS — Z0000 Encounter for general adult medical examination without abnormal findings: Secondary | ICD-10-CM

## 2016-07-14 DIAGNOSIS — I2583 Coronary atherosclerosis due to lipid rich plaque: Secondary | ICD-10-CM

## 2016-07-14 DIAGNOSIS — E785 Hyperlipidemia, unspecified: Secondary | ICD-10-CM

## 2016-07-14 DIAGNOSIS — J449 Chronic obstructive pulmonary disease, unspecified: Secondary | ICD-10-CM | POA: Diagnosis not present

## 2016-07-14 DIAGNOSIS — F322 Major depressive disorder, single episode, severe without psychotic features: Secondary | ICD-10-CM | POA: Diagnosis not present

## 2016-07-14 DIAGNOSIS — I350 Nonrheumatic aortic (valve) stenosis: Secondary | ICD-10-CM | POA: Diagnosis not present

## 2016-07-14 MED ORDER — CITALOPRAM HYDROBROMIDE 20 MG PO TABS: 20.0000 mg | ORAL_TABLET | Freq: Every day | ORAL | 3 refills | Status: DC

## 2016-07-14 NOTE — Progress Notes (Signed)
Pre visit review using our clinic review tool, if applicable. No additional management support is needed unless otherwise documented below in the visit note. 

## 2016-07-14 NOTE — Progress Notes (Signed)
Subjective:   Ronald Bell is a 76 y.o. male who presents for Medicare Annual/Subsequent preventive examination.  Review of Systems:  No ROS.  Medicare Wellness Visit. Cardiac Risk Factors include: advanced age (>56mn, >>79women);dyslipidemia;male gender;hypertension;sedentary lifestyle Sleep patterns:   Takes Retoril. Sleeps well about 7-8 hrs/night per pt. Home Safety/Smoke Alarms:  Feels safe in home. Smoke alarms in place.  Living environment; residence and Firearm Safety: Lives with wife in 1 story home.No guns. Seat Belt Safety/Bike Helmet: Wears seat belt.   Counseling:   Eye Exam- Wears glasses. [place] as needed. Dental-Upper and lower dentures. Gum care discussed.  Male:   CCS-  Last 12/05/14: AVM's cauterized.Nodule found-benign. Per report letter.   PSA-  Lab Results  Component Value Date   PSA 1.65 05/09/2015   PSA 1.47 05/10/2014   PSA 1.12 02/17/2013        Objective:    Vitals: BP (!) 148/68 (BP Location: Right Arm, Patient Position: Sitting, Cuff Size: Normal)   Pulse 73   Ht '5\' 3"'  (1.6 m)   Wt 154 lb (69.9 kg)   SpO2 96%   BMI 27.28 kg/m   Body mass index is 27.28 kg/m.  Tobacco History  Smoking Status  . Former Smoker  . Packs/day: 1.00  . Years: 60.00  . Types: Cigarettes, E-cigarettes  . Quit date: 04/18/2015  Smokeless Tobacco  . Never Used     Counseling given: No   Past Medical History:  Diagnosis Date  . Angiodysplasia of intestine with hemorrhage    large and SB, gastric AVMs.   . Anxiety   . Arthritis    "left shoulder" (10/19/2014)  . Carotid artery occlusion   . Carotid artery stenosis 04/22/2012  . COPD GOLD III with min reversibilty  08/11/2006   Followed in Pulmonary clinic/ [facility]/ [place] - PFT's 04/28/2013  FEV1 0.88 (40%) with ratio 44 and 14% better p B2 dlco 45 corrects to 83 - Trial of breo 04/28/2013 > improved symptoms  06/09/2013  - spirometry 06/04/2014 FEV1  0.76 (29%) ratio 45      . Coronary  artery disease   . GERD 11/30/2008   Qualifier: Diagnosis of  By: BMarijean NiemannCMA, Danielle    . GERD (gastroesophageal reflux disease)   . GI bleed 2010   4 units PRBCs  . History of blood transfusion "couple times"   "related to bleeding in colon and esophagus"  . Hx of adenomatous colonic polyps 2012, 2013.   .Marland KitchenHyperlipidemia   . Hypertension   . Iron deficiency anemia 01/25/2009   Qualifier: Diagnosis of  By: PHenrene PastorMD, JDocia Chuck  . Irregular heartbeat   . On home oxygen therapy    "2L at night" (07/23/2015)  . Peripheral vascular disease ([place]   . Pleural plaque with presence of asbestos 03/27/2013   Followed in Pulmonary clinic/  [facility]/ [place] - F/u CT 09/08/2013 1. Stable extensive calcified pleural plaque formation consistent with asbestos exposure. 2. Multiple pulmonary nodules are unchanged from the CT of 6 months ago. Given risk factors for lung cancer, continued follow up is recommended with chest CT in 6 months> done 04/20/14 no change >repeat in 12 m in tickle file     . Polyp of nasal cavity   . PVD (peripheral vascular disease) ([place] 10/18/2012  . Rosacea   . 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  Shingles   . Tobacco abuse    Past Surgical History:  Procedure Laterality Date  . CARDIAC CATHETERIZATION  2001; 06/28/2014  . CARDIAC CATHETERIZATION N/A 10/19/2014   Procedure: Coronary Stent Intervention;  Surgeon: Burnell Blanks, MD;  Location: [place] [facility];  Service: Cardiovascular;  Laterality: N/A;  BMS Mid RCA  . COLONOSCOPY  July 2015   Dr. Henrene Pastor  . COLONOSCOPY N/A 08/17/2014   Procedure: COLONOSCOPY;  Surgeon: Jerene Bears, MD;  Location: [facility] ENDOSCOPY;  Service: Endoscopy;  Laterality: N/A;  . COLONOSCOPY N/A 12/05/2014   Procedure: COLONOSCOPY;  Surgeon: Manus Gunning, MD;  Location: [place];  Service: Gastroenterology;  Laterality: N/A;   . ENTEROSCOPY N/A 12/05/2014   Procedure: ENTEROSCOPY;  Surgeon: Manus Gunning, MD;  Location: [facility] ENDOSCOPY;  Service: Gastroenterology;  Laterality: N/A;  . ESOPHAGOGASTRODUODENOSCOPY  2012   normal  . ESOPHAGOGASTRODUODENOSCOPY N/A 08/17/2014   Procedure: ESOPHAGOGASTRODUODENOSCOPY (EGD);  Surgeon: Jerene Bears, MD;  Location[facility] ENDOSCOPY;  Service: Endoscopy;  Laterality: N/A;  . ILIAC ARTERY STENT Left 2005   CIA  . KNEE ARTHROSCOPY WITH MEDIAL MENISECTOMY Left 03/08/2014   Procedure: LEFT KNEE SCOPE WITH MEDIAL MENISECTOMY AND CHONDROPLASTY;  Surgeon: Ninetta Lights, MD;  Location: [place];  Service: Orthopedics;  Laterality: Left;  . LEFT AND RIGHT HEART CATHETERIZATION WITH CORONARY ANGIOGRAM N/A 06/28/2014   Procedure: LEFT AND RIGHT HEART CATHETERIZATION WITH CORONARY ANGIOGRAM;  Surgeon: Jerline Pain, MD;  Location: [facility] CATH LAB;  Service: Cardiovascular;  Laterality: N/A;  . RIB PLATING Left 11/20/2014   Procedure: RIB PLATING OF LEFT 8TH RIB;  Surgeon: [name], MD;  Location: [place];  Service: Open Heart Surgery;  Laterality: Left;  . TEE WITHOUT CARDIOVERSION N/A 11/20/2014   Procedure: TRANSESOPHAGEAL ECHOCARDIOGRAM (TEE);  Surgeon: [name], MD;  Location: [place];  Service: Open Heart Surgery;  Laterality: N/A;  . TEE WITHOUT CARDIOVERSION N/A 12/19/2015   Procedure: TRANSESOPHAGEAL ECHOCARDIOGRAM (TEE);  Surgeon: Jerline Pain, MD;  Location: [facility] ENDOSCOPY;  Service: Cardiovascular;  Laterality: N/A;  . TONSILLECTOMY    . TRANSCATHETER AORTIC VALVE REPLACEMENT, TRANSAPICAL N/A 11/20/2014   Procedure: TRANSCATHETER AORTIC VALVE REPLACEMENT, TRANSAPICAL;  Surgeon: [name], MD;  Location: [place];  Service: Open Heart Surgery;  Laterality: N/A;   Family History  Problem Relation Age of Onset  . Lung disease Mother     pulm fibrosis  . Colitis Father   . Heart disease Brother   . Hypertension Brother   . Hyperlipidemia Brother   . CAD Daughter     cad    . Hypertension Son   . Colon cancer Neg Hx    History  Sexual Activity  . Sexual activity: No    Outpatient Encounter Prescriptions as of 07/14/2016  Medication Sig  . albuterol (PROVENTIL HFA;VENTOLIN HFA) 108 (90 Base) MCG/ACT inhaler Inhale 2 puffs into the lungs every 6 (six) hours as needed for wheezing or shortness of breath (Plan B).  . ALPRAZolam (XANAX) 0.5 MG tablet TAKE ONE TABLET BY MOUTH THREE TIMES DAILY AS NEEDED FOR ANXIETY  . arformoterol (BROVANA) 15 MCG/2ML NEBU Take 2 mLs (15 mcg total) by nebulization 2 (two) times daily.  Marland Kitchen aspirin 81 MG tablet Take 81 mg by mouth every morning.   Marland Kitchen atorvastatin (LIPITOR) 40 MG tablet Take 1 tablet (40 mg total) by mouth daily.  . bisoprolol (ZEBETA) 5 MG tablet Take 0.5 tablets (2.5 mg total) by mouth daily.  Marland Kitchen  budesonide (PULMICORT) 0.5 MG/2ML nebulizer solution Take 2 mLs (0.5 mg total) by nebulization 2 (two) times daily.  . citalopram (CELEXA) 10 MG tablet TAKE ONE TABLET BY MOUTH ONCE DAILY  . docusate sodium (COLACE) 100 MG capsule Take 200 mg by mouth daily.   . famotidine (PEPCID) 20 MG tablet Take 20 mg by mouth at bedtime.   . fenofibrate 160 MG tablet TAKE ONE TABLET BY MOUTH ONCE DAILY  . ferrous sulfate 325 (65 FE) MG EC tablet Take 1 tablet (325 mg total) by mouth 2 (two) times daily.  . furosemide (LASIX) 20 MG tablet Take 20 mg by mouth daily.  . Glycopyrrolate (LONHALA MAGNAIR REFILL KIT) 25 MCG/ML SOLN Inhale 1 Dose into the lungs 2 (two) times daily.  Marland Kitchen ipratropium-albuterol (DUONEB) 0.5-2.5 (3) MG/3ML SOLN Take 3 mLs by nebulization every 4 (four) hours as needed (shortness of breath and wheezing plan C).  . Multiple Vitamins-Minerals (CENTRUM SILVER PO) Take 1 tablet by mouth daily.   . OXYGEN Use 2L at bedtime and as needed for shortness of breath  . pantoprazole (PROTONIX) 40 MG tablet Take 1 tablet (40 mg total) by mouth daily.  . predniSONE (DELTASONE) 10 MG tablet Take 10 mg by mouth daily with breakfast.   . sodium chloride (OCEAN) 0.65 % SOLN nasal spray Place 1 spray into both nostrils as directed. Use as needed for nasal congestion   . temazepam (RESTORIL) 30 MG capsule TAKE ONE CAPSULE BY MOUTH AT BEDTIME AS NEEDED FOR SLEEP  . [DISCONTINUED] temazepam (RESTORIL) 15 MG capsule Take 2 capsules (30 mg total) by mouth at bedtime as needed for sleep.   No facility-administered encounter medications on file as of 07/14/2016.     Activities of Daily Living In your present state of health, do you have any difficulty performing the following activities: 07/14/2016 07/30/2015  Hearing? N N  Vision? N N  Difficulty concentrating or making decisions? N N  Walking or climbing stairs? Y N  Dressing or bathing? N N  Doing errands, shopping? N N  Preparing Food and eating ? N -  Using the Toilet? N -  In the past six months, have you accidently leaked urine? N -  Do you have problems with loss of bowel control? N -  Managing your Medications? N -  Managing your Finances? N -  Housekeeping or managing your Housekeeping? N -  Some recent data might be hidden    Patient Care Team: Ann Held, DO as PCP - General Rosetta Posner, MD as PCP - Cardiology (Vascular Surgery) Jerline Pain, MD as Attending Physician (Cardiology) Irene Shipper, MD as Consulting Physician (Gastroenterology) [name], MD as Consulting Physician (Cardiothoracic Surgery)   Assessment:    Physical assessment deferred to PCP.  Exercise Activities and Dietary recommendations Current Exercise Habits: The patient does not participate in regular exercise at present, Exercise limited by: respiratory conditions(s)   Diet (meal preparation, eat out, water intake, caffeinated beverages, dairy products, fruits and vegetables): on average, 2 meals per day Breakfast: Eggs and bacon. Coffee. Pancakes. Lunch: none Dinner: Meat and vegetables. Drinks 1 water, several coffees, and 2 beers per day. Encouraged to drink more  water.     Goals    . Quit smoking / using tobacco      Fall Risk Fall Risk  07/14/2016 05/09/2015 03/05/2014 02/17/2013 02/12/2012  Falls in the past year? No No No No No   Depression Screen PHQ 2/9 Scores  07/14/2016 05/09/2015 03/05/2014 02/17/2013  PHQ - 2 Score 2 1 0 0  PHQ- 9 Score 3 - - -    Cognitive Function Ad8 score reviewed for issues:  Issues making decisions:no  Less interest in hobbies / activities:no  Repeats questions, stories (family complaining):no  Trouble using ordinary gadgets (microwave, computer, phone):no  Forgets the month or year: no  Mismanaging finances: no  Remembering appts:no  Daily problems with thinking and/or memory:no Ad8 score is=0         Immunization History  Administered Date(s) Administered  . Influenza Split 01/23/2011, 02/12/2012  . Influenza Whole 01/28/2007, 12/14/2007, 12/14/2008, 01/17/2010  . Influenza, High Dose Seasonal PF 12/17/2014, 12/06/2015  . Influenza,inj,Quad PF,36+ Mos 02/17/2013  . Influenza-Unspecified 01/09/2014  . Pneumococcal Conjugate-13 05/10/2014  . Pneumococcal Polysaccharide-23 12/18/2003, 11/30/2008  . Td 03/23/2002  . Tdap 03/03/2013  . Zoster 01/23/2011   Screening Tests Health Maintenance  Topic Date Due  . INFLUENZA VACCINE  10/21/2016  . COLONOSCOPY  12/04/2016  . TETANUS/TDAP  03/04/2023  . PNA vac Low Risk Adult  Completed      Plan:     Follow up with Dr.Lowne today as scheduled.  Continue to eat heart healthy diet (full of fruits, vegetables, whole grains, lean protein, water--limit salt, fat, and sugar intake) and increase physical activity as tolerated.  Continue doing brain stimulating activities (puzzles, reading, adult coloring books, staying active) to keep memory sharp.   Consider getting Carbon monoxide detector. During the course of the visit the patient was educated and counseled about the following appropriate screening and preventive services:   Vaccines to  include Pneumoccal, Influenza, Td, HCV  Cardiovascular Disease  Colorectal cancer screening  Diabetes screening  Prostate Cancer Screening  Glaucoma screening  Nutrition counseling   Patient Instructions (the written plan) was given to the patient.    Naaman Plummer [place], [place]  07/14/2016

## 2016-07-14 NOTE — Assessment & Plan Note (Signed)
Check labs f/u GI 6 months per pt

## 2016-07-14 NOTE — Assessment & Plan Note (Signed)
Per cardiology 

## 2016-07-14 NOTE — Assessment & Plan Note (Signed)
Per cardiology and cardiothoracic surgeon

## 2016-07-14 NOTE — Progress Notes (Signed)
Patient ID: Ronald Bell, male    DOB: October 28, 1940  Age: 76 y.o. MRN: [id number]    Subjective:  Subjective  HPI [name] presents for f/u cholesterol and he had a medicare wellness visit today.   Pt seeing CT surgeon, cardiology and pulmonary. He is also getting palliative Care.  Pt with no   Review of Systems  Constitutional: Positive for activity change and fatigue. Negative for appetite change, chills, diaphoresis, fever and unexpected weight change.  HENT: Negative for congestion, postnasal drip, rhinorrhea and sinus pressure.   Respiratory: Positive for cough, chest tightness, shortness of breath and wheezing.   Cardiovascular: Negative for chest pain, palpitations and leg swelling.  Allergic/Immunologic: Negative for environmental allergies.  Psychiatric/Behavioral: Positive for dysphoric mood. Negative for decreased concentration.    History Past Medical History:  Diagnosis Date  . Angiodysplasia of intestine with hemorrhage    large and SB, gastric AVMs.   . Anxiety   . Arthritis    "left shoulder" (10/19/2014)  . Carotid artery occlusion   . Carotid artery stenosis 04/22/2012  . COPD GOLD III with min reversibilty  08/11/2006   Followed in Pulmonary clinic/ [facility]/ [place] - PFT's 04/28/2013  FEV1 0.88 (40%) with ratio 44 and 14% better p B2 dlco 45 corrects to 83 - Trial of breo 04/28/2013 > improved symptoms  06/09/2013  - spirometry 06/04/2014 FEV1  0.76 (29%) ratio 45      . Coronary artery disease   . GERD 11/30/2008   Qualifier: Diagnosis of  By: Marijean Niemann CMA, Danielle    . GERD (gastroesophageal reflux disease)   . GI bleed 2010   4 units PRBCs  . History of blood transfusion "couple times"   "related to bleeding in colon and esophagus"  . Hx of adenomatous colonic polyps 2012, 2013.   Marland Kitchen Hyperlipidemia   . Hypertension   . Iron deficiency anemia 01/25/2009   Qualifier: Diagnosis of  By: Henrene Pastor MD, Docia Chuck   . Irregular heartbeat   . On home  oxygen therapy    "2L at night" (07/23/2015)  . Peripheral vascular disease ([place])   . Pleural plaque with presence of asbestos 03/27/2013   Followed in Pulmonary clinic/ [facility]/ [place] - F/u CT 09/08/2013 1. Stable extensive calcified pleural plaque formation consistent with asbestos exposure. 2. Multiple pulmonary nodules are unchanged from the CT of 6 months ago. Given risk factors for lung cancer, continued follow up is recommended with chest CT in 6 months> done 04/20/14 no change >repeat in 12 m in tickle file     . Polyp of nasal cavity   . PVD (peripheral vascular disease) ([place]) 10/18/2012  . Rosacea   . 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 Shingles   . Tobacco abuse     He has a past surgical history that includes Iliac artery stent (Left, 2005); Esophagogastroduodenoscopy (2012); Colonoscopy (July 2015); Tonsillectomy; Knee arthroscopy with medial menisectomy (Left, 03/08/2014); left and right heart catheterization with coronary angiogram (N/A, 06/28/2014); Esophagogastroduodenoscopy (N/A, 08/17/2014); Colonoscopy (N/A, 08/17/2014); Cardiac catheterization (2001; 06/28/2014); Cardiac catheterization (N/A, 10/19/2014); Transcatheter aortic valve replacement, transapical (N/A, 11/20/2014); TEE without cardioversion (N/A, 11/20/2014); Rib plating (Left, 11/20/2014); Colonoscopy (N/A, 12/05/2014); enteroscopy (N/A, 12/05/2014); and TEE without cardioversion (N/A, 12/19/2015).   His family history includes CAD in his daughter; Colitis in his father; Heart disease in his brother; Hyperlipidemia in his brother; Hypertension in  his brother and son; Lung disease in his mother.He reports that he quit smoking about 14 months ago. His smoking use included Cigarettes and E-cigarettes. He has a 60.00 pack-year smoking history. He has never used smokeless tobacco. He reports that he drinks about 8.4 oz of  alcohol per week . He reports that he does not use drugs.  Current Outpatient Prescriptions on File Prior to Visit  Medication Sig Dispense Refill  . albuterol (PROVENTIL HFA;VENTOLIN HFA) 108 (90 Base) MCG/ACT inhaler Inhale 2 puffs into the lungs every 6 (six) hours as needed for wheezing or shortness of breath (Plan B).    . ALPRAZolam (XANAX) 0.5 MG tablet TAKE ONE TABLET BY MOUTH THREE TIMES DAILY AS NEEDED FOR ANXIETY 90 tablet 1  . arformoterol (BROVANA) 15 MCG/2ML NEBU Take 2 mLs (15 mcg total) by nebulization 2 (two) times daily. 120 mL 3  . aspirin 81 MG tablet Take 81 mg by mouth every morning.     Marland Kitchen atorvastatin (LIPITOR) 40 MG tablet Take 1 tablet (40 mg total) by mouth daily. 90 tablet 1  . bisoprolol (ZEBETA) 5 MG tablet Take 0.5 tablets (2.5 mg total) by mouth daily. 30 tablet 6  . budesonide (PULMICORT) 0.5 MG/2ML nebulizer solution Take 2 mLs (0.5 mg total) by nebulization 2 (two) times daily. 240 mL 12  . docusate sodium (COLACE) 100 MG capsule Take 200 mg by mouth daily.     . famotidine (PEPCID) 20 MG tablet Take 20 mg by mouth at bedtime.     . fenofibrate 160 MG tablet TAKE ONE TABLET BY MOUTH ONCE DAILY 90 tablet 0  . ferrous sulfate 325 (65 FE) MG EC tablet Take 1 tablet (325 mg total) by mouth 2 (two) times daily. 60 tablet 11  . furosemide (LASIX) 20 MG tablet Take 20 mg by mouth daily.    . Glycopyrrolate (LONHALA MAGNAIR REFILL KIT) 25 MCG/ML SOLN Inhale 1 Dose into the lungs 2 (two) times daily.    Marland Kitchen ipratropium-albuterol (DUONEB) 0.5-2.5 (3) MG/3ML SOLN Take 3 mLs by nebulization every 4 (four) hours as needed (shortness of breath and wheezing plan C).    . Multiple Vitamins-Minerals (CENTRUM SILVER PO) Take 1 tablet by mouth daily.     . OXYGEN Use 2L at bedtime and as needed for shortness of breath    . pantoprazole (PROTONIX) 40 MG tablet Take 1 tablet (40 mg total) by mouth daily. 90 tablet 3  . predniSONE (DELTASONE) 10 MG tablet Take 10 mg by mouth daily with  breakfast.    . sodium chloride (OCEAN) 0.65 % SOLN nasal spray Place 1 spray into both nostrils as directed. Use as needed for nasal congestion     . temazepam (RESTORIL) 30 MG capsule TAKE ONE CAPSULE BY MOUTH AT BEDTIME AS NEEDED FOR SLEEP 30 capsule 5   No current facility-administered medications on file prior to visit.      Objective:  Objective  Physical Exam  Constitutional: He is oriented to person, place, and time. Vital signs are normal. He appears well-developed and well-nourished. He is sleeping.  HENT:  Head: Normocephalic and atraumatic.  Mouth/Throat: Oropharynx is clear and moist.  Eyes: EOM are normal. Pupils are equal, round, and reactive to light.  Neck: Normal range of motion. Neck supple. No thyromegaly present.  Cardiovascular: Normal rate and regular rhythm.   Murmur heard. Pulmonary/Chest: Effort normal and breath sounds normal. No respiratory distress. He has no wheezes. He has no rales. He exhibits no tenderness.  Musculoskeletal: He exhibits no edema or tenderness.  Neurological: He is alert and oriented to person, place, and time.  Skin: Skin is warm and dry.  Psychiatric: He has a normal mood and affect. His behavior is normal. Judgment and thought content normal.  Nursing note and vitals reviewed.  BP (!) 148/68 (BP Location: Right Arm, Patient Position: Sitting, Cuff Size: Normal)   Pulse 73   Ht '5\' 3"'  (1.6 m)   Wt 154 lb (69.9 kg)   SpO2 96%   BMI 27.28 kg/m  Wt Readings from Last 3 Encounters:  07/14/16 154 lb (69.9 kg)  06/16/16 155 lb 1.9 oz (70.4 kg)  04/23/16 159 lb (72.1 kg)     Lab Results  Component Value Date   WBC 12.9 (H) 04/23/2016   HGB 14.2 04/23/2016   HCT 41.6 04/23/2016   PLT 235.0 04/23/2016   GLUCOSE 102 (H) 12/06/2015   CHOL 159 12/06/2015   TRIG 128.0 12/06/2015   HDL 63.70 12/06/2015   LDLDIRECT 114.0 05/09/2015   LDLCALC 69 12/06/2015   ALT 21 12/06/2015   AST 20 12/06/2015   NA 139 12/06/2015   K 4.3  12/06/2015   CL 103 12/06/2015   CREATININE 0.97 12/06/2015   BUN 18 12/06/2015   CO2 30 12/06/2015   TSH 1.206 06/01/2014   PSA 1.65 05/09/2015   INR 1.27 12/03/2014   HGBA1C 6.1 (H) 11/16/2014   MICROALBUR 1.1 03/05/2014    No results found.   Assessment & Plan:  Plan  I have discontinued Mr. Rana citalopram. I am also having him start on citalopram. Additionally, I am having him maintain his Multiple Vitamins-Minerals (CENTRUM SILVER PO), docusate sodium, ferrous sulfate, aspirin, OXYGEN, famotidine, budesonide, sodium chloride, predniSONE, ipratropium-albuterol, pantoprazole, atorvastatin, albuterol, arformoterol, fenofibrate, ALPRAZolam, Glycopyrrolate, furosemide, bisoprolol, and temazepam.  Meds ordered this encounter  Medications  . citalopram (CELEXA) 20 MG tablet    Sig: Take 1 tablet (20 mg total) by mouth daily.    Dispense:  90 tablet    Refill:  3    Problem List Items Addressed This Visit      Unprioritized   ANEMIA, SECONDARY TO BLOOD LOSS    Check labs f/u GI 6 months per pt      Aortic stenosis, severe    Per cardiology and cardiothoracic surgeon      Atrial fibrillation with rapid ventricular response [facility])    Per cardiology      COPD GOLD III with min reversibilty  (Chronic)    Per pulm con't meds      Hyperlipidemia    Tolerating statin, encouraged heart healthy diet, avoid trans fats, minimize simple carbs and saturated fats. Increase exercise as tolerated      Mixed hyperlipidemia    Encouraged heart healthy diet, increase exercise, avoid trans fats, consider a krill oil cap daily        Other Visit Diagnoses    Encounter for Medicare annual wellness exam    -  Primary   Hyperlipidemia LDL goal <100       Relevant Orders   Comprehensive metabolic panel   Lipid panel   Depression, major, single episode, severe ([place])       Relevant Medications   citalopram (CELEXA) 20 MG tablet      Follow-up: Return in about 6 months  (around 01/13/2017) for hypertension, hyperlipidemia, fasting.  Ann Held, DO

## 2016-07-14 NOTE — Assessment & Plan Note (Signed)
Per pulm con't meds 

## 2016-07-14 NOTE — Assessment & Plan Note (Signed)
Tolerating statin, encouraged heart healthy diet, avoid trans fats, minimize simple carbs and saturated fats. Increase exercise as tolerated 

## 2016-07-14 NOTE — Patient Instructions (Signed)
Continue to eat heart healthy diet (full of fruits, vegetables, whole grains, lean protein, water--limit salt, fat, and sugar intake) and increase physical activity as tolerated.  Continue doing brain stimulating activities (puzzles, reading, adult coloring books, staying active) to keep memory sharp.   Consider getting Carbon monoxide detector.

## 2016-07-14 NOTE — Assessment & Plan Note (Signed)
Encouraged heart healthy diet, increase exercise, avoid trans fats, consider a krill oil cap daily 

## 2016-07-15 LAB — COMPREHENSIVE METABOLIC PANEL
ALT: 38 U/L (ref 0–53)
AST: 29 U/L (ref 0–37)
Albumin: 4.4 g/dL (ref 3.5–5.2)
Alkaline Phosphatase: 28 U/L — ABNORMAL LOW (ref 39–117)
BILIRUBIN TOTAL: 0.3 mg/dL (ref 0.2–1.2)
BUN: 18 mg/dL (ref 6–23)
CALCIUM: 10.3 mg/dL (ref 8.4–10.5)
CO2: 26 meq/L (ref 19–32)
CREATININE: 1.14 mg/dL (ref 0.40–1.50)
Chloride: 99 mEq/L (ref 96–112)
GFR: 66.05 mL/min (ref >60.00)
Glucose, Bld: 147 mg/dL — ABNORMAL HIGH (ref 70–99)
Potassium: 4.4 mEq/L (ref 3.5–5.1)
Sodium: 136 mEq/L (ref 135–145)
Total Protein: 7.6 g/dL (ref 6.0–8.3)

## 2016-07-15 LAB — LIPID PANEL
CHOL/HDL RATIO: 3
Cholesterol: 184 mg/dL (ref 0–200)
HDL: 61.3 mg/dL (ref >39.00)
LDL Cholesterol: 89 mg/dL (ref 0–99)
NONHDL: 123.12
TRIGLYCERIDES: 173 mg/dL — AB (ref 0.0–149.0)
VLDL: 34.6 mg/dL (ref 0.0–40.0)

## 2016-07-17 ENCOUNTER — Other Ambulatory Visit: Payer: Self-pay | Admitting: Internal Medicine

## 2016-07-17 ENCOUNTER — Other Ambulatory Visit (HOSPITAL_COMMUNITY): Payer: Medicare Other

## 2016-07-17 DIAGNOSIS — J441 Chronic obstructive pulmonary disease with (acute) exacerbation: Secondary | ICD-10-CM

## 2016-07-21 ENCOUNTER — Ambulatory Visit (INDEPENDENT_AMBULATORY_CARE_PROVIDER_SITE_OTHER): Payer: Medicare Other | Admitting: Adult Health

## 2016-07-21 ENCOUNTER — Encounter: Payer: Self-pay | Admitting: Adult Health

## 2016-07-21 DIAGNOSIS — J9611 Chronic respiratory failure with hypoxia: Secondary | ICD-10-CM | POA: Diagnosis not present

## 2016-07-21 DIAGNOSIS — J449 Chronic obstructive pulmonary disease, unspecified: Secondary | ICD-10-CM | POA: Diagnosis not present

## 2016-07-21 DIAGNOSIS — J9612 Chronic respiratory failure with hypercapnia: Secondary | ICD-10-CM

## 2016-07-21 NOTE — Assessment & Plan Note (Signed)
Improved sx control  Cont on current regimen  Patient's medications were reviewed today and patient education was given. Computerized medication calendar was adjusted/completed   Plan  Patient Instructions  Follow med calendar closely and bring to each visit Follow up with Dr. [name] in 3 -4 months and as needed

## 2016-07-21 NOTE — Progress Notes (Signed)
'@Patient'  ID: Ronald Bell, male    DOB: [birth year], 76 y.o.   MRN: [id number]  Chief Complaint  Patient presents with  . 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 acei cough    TEST  PFT's 04/28/2013  FEV1 0.88 (40%) with ratio 44 and 14% better p B2 dlco 45 corrects to 83 - spirometry 06/04/2014 FEV1  0.76 (29%) ratio 45  - PFT's  02/22/2015  FEV1 0.84 (40 % ) ratio 41  p 5 % improvement from saba with DLCO  40 % corrects to 51 % for alv volume    07/21/2016 Follow up  :COPD GOLD III, O2 RF /Chronic prednisone 30m daily  Patient returns for a three-month follow-up. Last visit. Patient was started on [employer].  He says he likes the new neb med . Feels it is helping . No flare of cough or wheezing .  Remains on Brovana /Budesonide neb Twice daily  .  Uses O2 at 2l/m At bedtime   Remains on prednisone 188mdaily .   We reviewed all his med and organized them into a med calendar with pt education .  Appears to be taking correctly .     No Known Allergies  Immunization History  Administered Date(s) Administered  . Influenza Split 01/23/2011, 02/12/2012  . Influenza Whole 01/28/2007, 12/14/2007, 12/14/2008, 01/17/2010  . Influenza, High Dose Seasonal PF 12/17/2014, 12/06/2015  . Influenza,inj,Quad PF,36+ Mos 02/17/2013  . Influenza-Unspecified 01/09/2014  . Pneumococcal Conjugate-13 05/10/2014  . Pneumococcal Polysaccharide-23 12/18/2003, 11/30/2008  . Td 03/23/2002  . Tdap 03/03/2013  . Zoster 01/23/2011    Past Medical History:  Diagnosis Date  . Angiodysplasia of intestine with hemorrhage    large and SB, gastric AVMs.   . Anxiety   . Arthritis    "left shoulder" (10/19/2014)  . Carotid artery occlusion   . Carotid artery  stenosis 04/22/2012  . COPD GOLD III with min reversibilty  08/11/2006   Followed in Pulmonary clinic/ [facility]/ [place] - PFT's 04/28/2013  FEV1 0.88 (40%) with ratio 44 and 14% better p B2 dlco 45 corrects to 83 - Trial of breo 04/28/2013 > improved symptoms  06/09/2013  - spirometry 06/04/2014 FEV1  0.76 (29%) ratio 45      . Coronary artery disease   . GERD 11/30/2008   Qualifier: Diagnosis of  By: BaMarijean NiemannMA, Danielle    . GERD (gastroesophageal reflux disease)   . GI bleed 2010   4 units PRBCs  . History of blood transfusion "couple times"   "related to bleeding in colon and esophagus"  . Hx of adenomatous colonic polyps 2012, 2013.   . Marland Kitchenyperlipidemia   . Hypertension   . Iron deficiency anemia 01/25/2009   Qualifier: Diagnosis of  By: PeHenrene PastorD, JoDocia Chuck . Irregular heartbeat   . On home oxygen therapy    "2L at night" (07/23/2015)  . [place]  . Pleural plaque with presence of asbestos 03/27/2013   Followed in Pulmonary clinic/ [facility]/ [place] - F/u CT 09/08/2013 1. Stable extensive calcified pleural plaque formation consistent with asbestos exposure. 2. Multiple pulmonary nodules are unchanged from the CT of  6 months ago. Given risk factors for lung cancer, continued follow up is recommended with chest CT in 6 months> done 04/20/14 no change >repeat in 12 m in tickle file     . Polyp of nasal cavity   . PVD (peripheral vascular disease) ([place]) 10/18/2012  . Rosacea   . 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 Shingles   . Tobacco abuse     Tobacco History: History  Smoking Status  . Former Smoker  . Packs/day: 1.00  . Years: 60.00  . Types: Cigarettes, E-cigarettes  . Quit date: 04/18/2015  Smokeless Tobacco  . Never Used   Counseling given: Not Answered   Outpatient Encounter Prescriptions as of 07/21/2016  Medication Sig  . albuterol  (PROVENTIL HFA;VENTOLIN HFA) 108 (90 Base) MCG/ACT inhaler Inhale 2 puffs into the lungs every 6 (six) hours as needed for wheezing or shortness of breath (Plan B).  . ALPRAZolam (XANAX) 0.5 MG tablet TAKE ONE TABLET BY MOUTH THREE TIMES DAILY AS NEEDED FOR ANXIETY  . aspirin 81 MG tablet Take 81 mg by mouth every morning.   Marland Kitchen atorvastatin (LIPITOR) 40 MG tablet Take 1 tablet (40 mg total) by mouth daily.  . bisoprolol (ZEBETA) 5 MG tablet Take 0.5 tablets (2.5 mg total) by mouth daily.  Marland Kitchen BROVANA 15 MCG/2ML NEBU USE ONE VIAL IN NEBULIZER TWICE DAILY  . budesonide (PULMICORT) 0.5 MG/2ML nebulizer solution Take 2 mLs (0.5 mg total) by nebulization 2 (two) times daily.  . citalopram (CELEXA) 20 MG tablet Take 1 tablet (20 mg total) by mouth daily.  Marland Kitchen docusate sodium (COLACE) 100 MG capsule Take 200 mg by mouth daily.   . famotidine (PEPCID) 20 MG tablet Take 20 mg by mouth at bedtime.   . fenofibrate 160 MG tablet TAKE ONE TABLET BY MOUTH ONCE DAILY  . ferrous sulfate 325 (65 FE) MG EC tablet Take 1 tablet (325 mg total) by mouth 2 (two) times daily.  . furosemide (LASIX) 20 MG tablet Take 20 mg by mouth daily.  . Glycopyrrolate (LONHALA MAGNAIR REFILL KIT) 25 MCG/ML SOLN Inhale 1 Dose into the lungs 2 (two) times daily.  Marland Kitchen ipratropium-albuterol (DUONEB) 0.5-2.5 (3) MG/3ML SOLN Take 3 mLs by nebulization every 4 (four) hours as needed (shortness of breath and wheezing plan C).  . Multiple Vitamins-Minerals (CENTRUM SILVER PO) Take 1 tablet by mouth daily.   . OXYGEN Use 2L at bedtime and as needed for shortness of breath  . pantoprazole (PROTONIX) 40 MG tablet Take 1 tablet (40 mg total) by mouth daily.  . predniSONE (DELTASONE) 10 MG tablet Take 10 mg by mouth daily with breakfast.  . sodium chloride (OCEAN) 0.65 % SOLN nasal spray Place 1 spray into both nostrils as directed. Use as needed for nasal congestion   . temazepam (RESTORIL) 30 MG capsule TAKE ONE CAPSULE BY MOUTH AT BEDTIME AS NEEDED  FOR SLEEP   No facility-administered encounter medications on file as of 07/21/2016.      Review of Systems  Constitutional:   No  weight loss, night sweats,  Fevers, chills +, fatigue, or  lassitude.  HEENT:   No headaches,  Difficulty swallowing,  Tooth/dental problems, or  Sore throat,                No sneezing, itching, ear ache, nasal congestion, post nasal drip,   CV:  No chest pain,  Orthopnea,  PND, swelling in lower extremities, anasarca, dizziness, palpitations, syncope.   GI  No heartburn, indigestion, abdominal pain, nausea, vomiting, diarrhea, change in bowel habits, loss of appetite, bloody stools.   Resp: No chest wall deformity  Skin: no rash or lesions.  GU: no dysuria, change in color of urine, no urgency or frequency.  No flank pain, no hematuria   MS:  No joint pain or swelling.  No decreased range of motion.  No back pain.    Physical Exam  BP 112/68 (BP Location: Right Arm, Cuff Size: Normal)   Pulse 70   Ht '5\' 2"'  (1.575 m)   Wt 154 lb 9.6 oz (70.1 kg)   SpO2 95%   BMI 28.28 kg/m   GEN: A/Ox3; pleasant , NAD, elderly .    HEENT:  [place]/AT,  EACs-clear, TMs-wnl, NOSE-clear, THROAT-clear, no lesions, no postnasal drip or exudate noted.   NECK:  Supple w/ fair ROM; no JVD; normal carotid impulses w/o bruits; no thyromegaly or nodules palpated; no lymphadenopathy.    RESP  Decreased BS in bases ,  no accessory muscle use, no dullness to percussion  CARD:  RRR, no m/r/g, no peripheral edema, pulses intact, no cyanosis or clubbing.  GI:   Soft & nt; nml bowel sounds; no organomegaly or masses detected.   Musco: Warm bil, no deformities or joint swelling noted.   Neuro: alert, no focal deficits noted.    Skin: Warm, no lesions or rashes    Lab Results:  CBC    Component Value Date/Time   WBC 12.9 (H) 04/23/2016 1318   RBC 4.30 04/23/2016 1318   HGB 14.2 04/23/2016 1318   HCT 41.6 04/23/2016 1318   HCT 35.4 (L) 07/20/2015 2045   PLT 235.0  04/23/2016 1318   MCV 96.8 04/23/2016 1318   MCH 32.2 07/31/2015 0335   MCHC 34.0 04/23/2016 1318   RDW 14.1 04/23/2016 1318   LYMPHSABS 0.7 04/23/2016 1318   MONOABS 1.1 (H) 04/23/2016 1318   EOSABS 0.1 04/23/2016 1318   BASOSABS 0.0 04/23/2016 1318    BMET    Component Value Date/Time   NA 136 07/14/2016 1528   K 4.4 07/14/2016 1528   CL 99 07/14/2016 1528   CO2 26 07/14/2016 1528   GLUCOSE 147 (H) 07/14/2016 1528   GLUCOSE 105 (H) 02/26/2006 1044   BUN 18 07/14/2016 1528   CREATININE 1.14 07/14/2016 1528   CREATININE 0.70 02/09/2014 1550   CALCIUM 10.3 07/14/2016 1528   GFRNONAA >60 07/31/2015 0335   GFRAA >60 07/31/2015 0335    BNP    Component Value Date/Time   BNP 68.0 07/30/2015 0806    ProBNP No results found for: PROBNP  Imaging: No results found.   Assessment & Plan:   COPD GOLD III with min reversibilty  Improved sx control  Cont on current regimen  Patient's medications were reviewed today and patient education was given. Computerized medication calendar was adjusted/completed   Plan  Patient Instructions  Follow med calendar closely and bring to each visit Follow up with Dr. [name] in 3 -4 months and as needed       Rexene Edison, NP 07/21/2016

## 2016-07-21 NOTE — Progress Notes (Signed)
Chart and office note reviewed in detail  > agree with a/p as outlined    

## 2016-07-21 NOTE — Assessment & Plan Note (Signed)
Cont on Oxygen At bedtime

## 2016-07-21 NOTE — Patient Instructions (Signed)
Follow med calendar closely and bring to each visit Follow up with Dr. [name] in 3 -4 months and as needed

## 2016-07-24 ENCOUNTER — Other Ambulatory Visit: Payer: Self-pay | Admitting: Family Medicine

## 2016-07-28 ENCOUNTER — Other Ambulatory Visit: Payer: Self-pay

## 2016-07-28 ENCOUNTER — Ambulatory Visit (HOSPITAL_COMMUNITY): Payer: Medicare Other | Attending: Cardiovascular Disease

## 2016-07-28 DIAGNOSIS — E785 Hyperlipidemia, unspecified: Secondary | ICD-10-CM | POA: Insufficient documentation

## 2016-07-28 DIAGNOSIS — I4891 Unspecified atrial fibrillation: Secondary | ICD-10-CM | POA: Diagnosis not present

## 2016-07-28 DIAGNOSIS — Z953 Presence of xenogenic heart valve: Secondary | ICD-10-CM | POA: Diagnosis not present

## 2016-07-28 DIAGNOSIS — I739 Peripheral vascular disease, unspecified: Secondary | ICD-10-CM | POA: Insufficient documentation

## 2016-07-28 DIAGNOSIS — I429 Cardiomyopathy, unspecified: Secondary | ICD-10-CM | POA: Diagnosis not present

## 2016-07-28 DIAGNOSIS — J449 Chronic obstructive pulmonary disease, unspecified: Secondary | ICD-10-CM | POA: Insufficient documentation

## 2016-07-28 DIAGNOSIS — I5022 Chronic systolic (congestive) heart failure: Secondary | ICD-10-CM | POA: Insufficient documentation

## 2016-07-28 DIAGNOSIS — I110 Hypertensive heart disease with heart failure: Secondary | ICD-10-CM | POA: Insufficient documentation

## 2016-07-31 NOTE — Addendum Note (Signed)
Addended by: Parke Poisson E on: 07/31/2016 12:20 PM   Modules accepted: Orders

## 2016-08-04 ENCOUNTER — Other Ambulatory Visit: Payer: Self-pay | Admitting: Family Medicine

## 2016-08-04 DIAGNOSIS — F411 Generalized anxiety disorder: Secondary | ICD-10-CM

## 2016-08-04 NOTE — Telephone Encounter (Signed)
Requesting:   alprazolam Contract    02/12/2014 UDS   none Last OV     07/14/2016----future appt is on 01/15/2017 Last Refill    #90 with 1 refill on 06/12/2016  Please Advise

## 2016-08-04 NOTE — Telephone Encounter (Signed)
Faxed hardcopy for alprazolam to [facility]

## 2016-08-20 ENCOUNTER — Telehealth: Payer: Self-pay | Admitting: *Deleted

## 2016-08-20 NOTE — Telephone Encounter (Signed)
Received Physician Orders from [facility] via mail with [facility] envelope, forwarded to provider/SLS 05/31

## 2016-08-30 IMAGING — US US EXTREM LOW VENOUS*L*
1 series · 13 of 24 positions shown · non-contrast
Comparison: None.

CLINICAL DATA: Left knee pain and thigh pain



[Series 1: us extrem low venous*left* · 0.08mm/px · 13 of 44 slices shown]
[im 1/44]
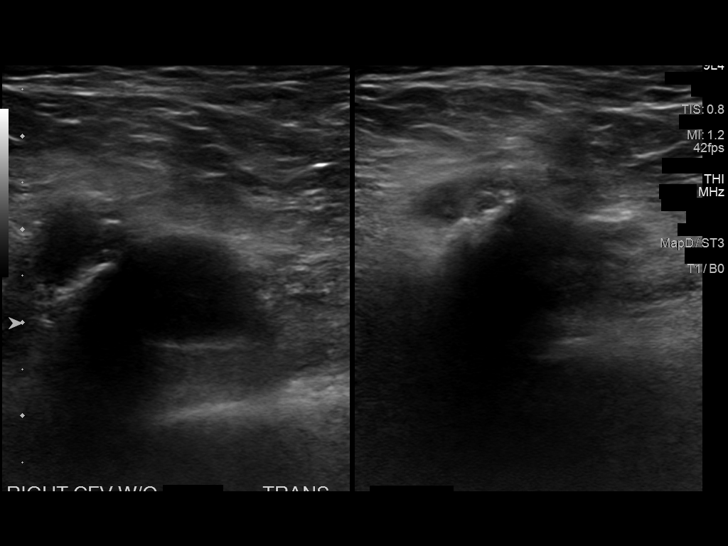
[im 4/44]
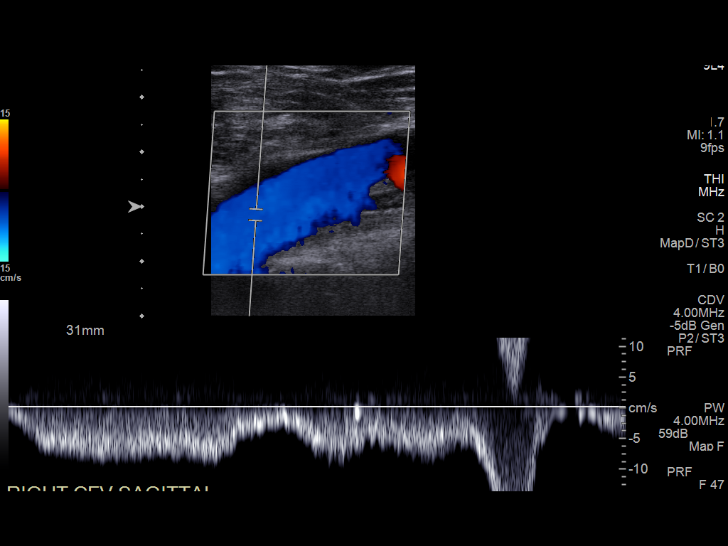
[im 8/44]
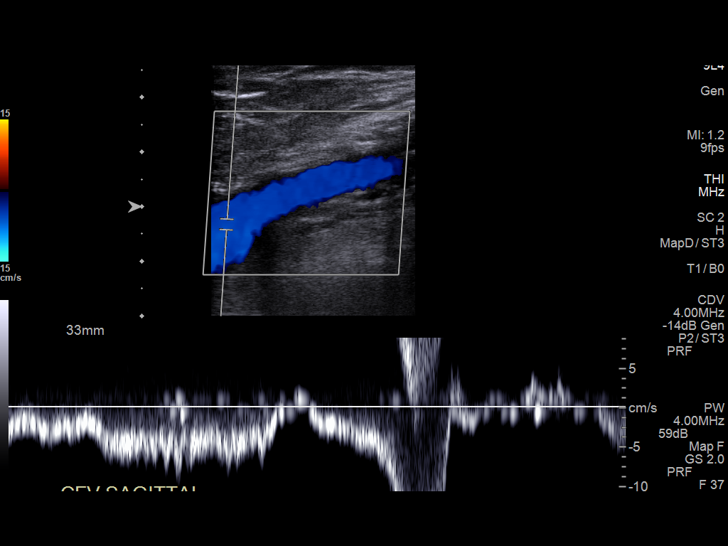
[im 12/44]
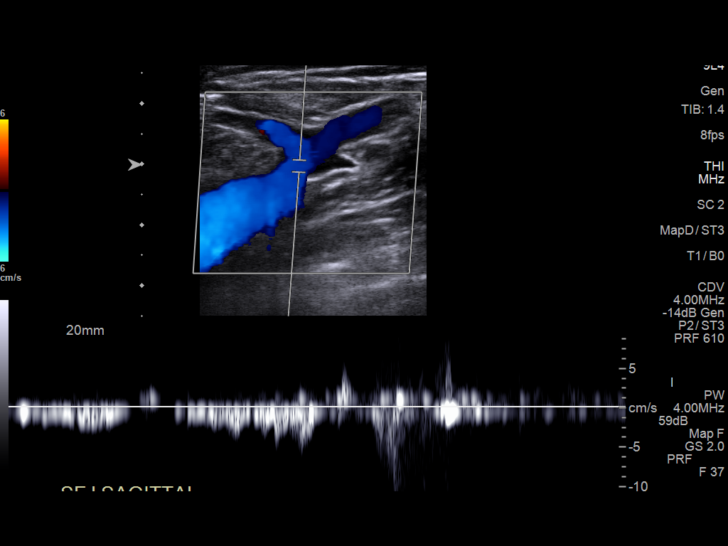
[im 15/44]
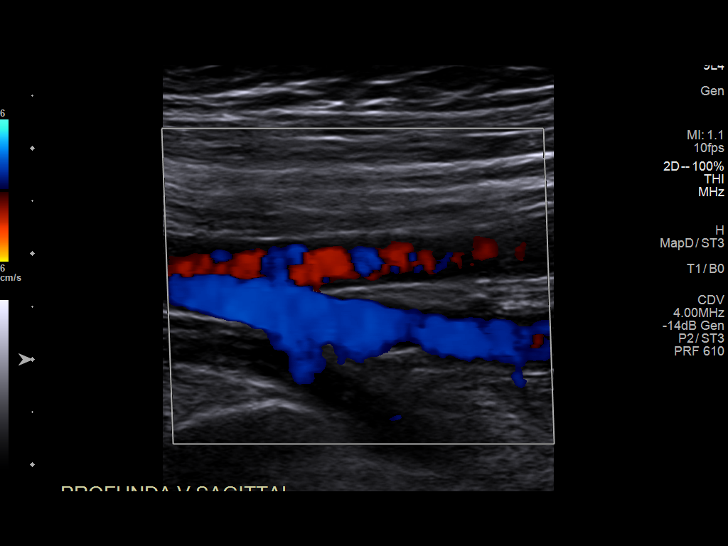
[im 19/44]
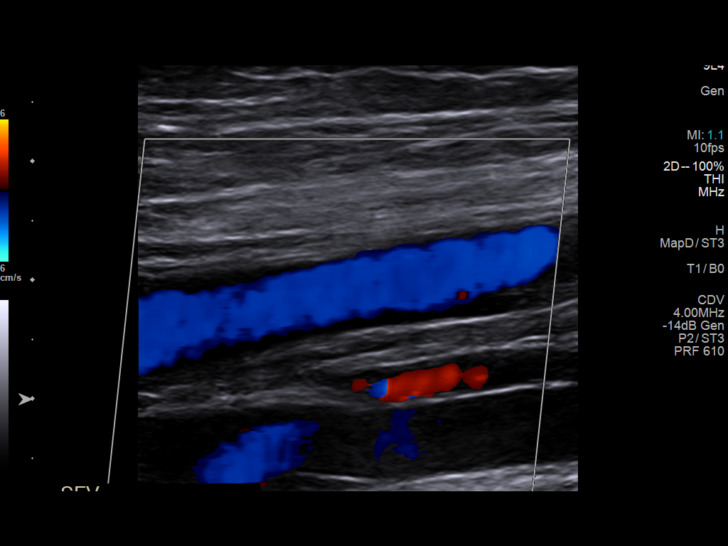
[im 23/44]
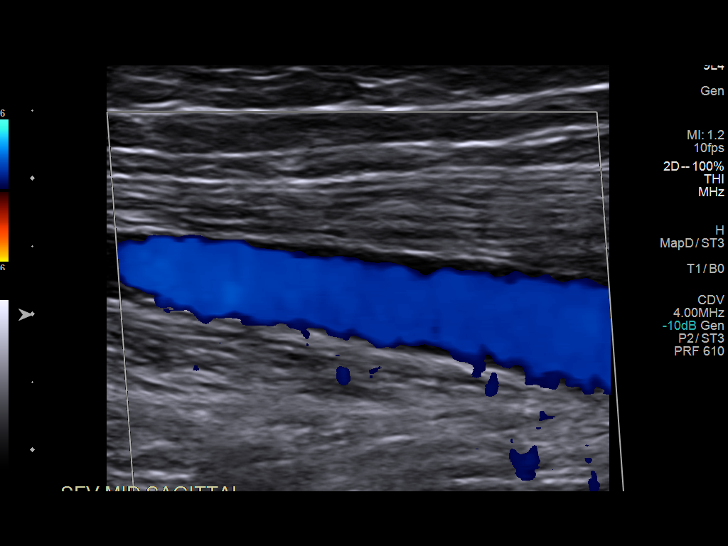
[im 25/44]
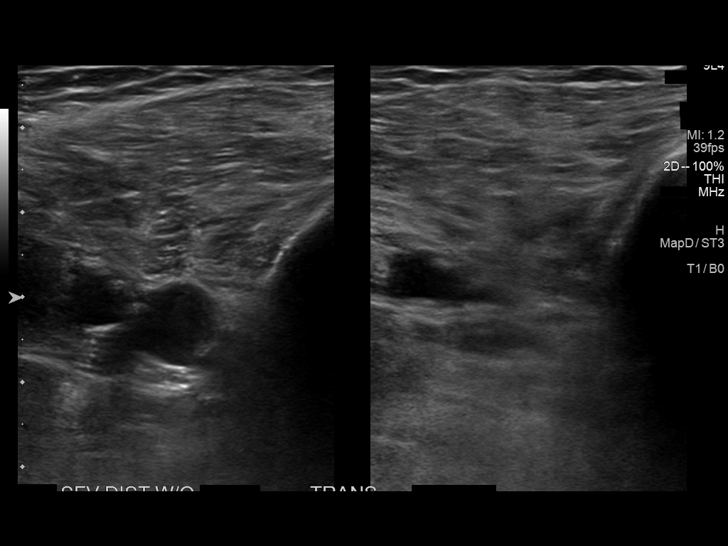
[im 29/44]
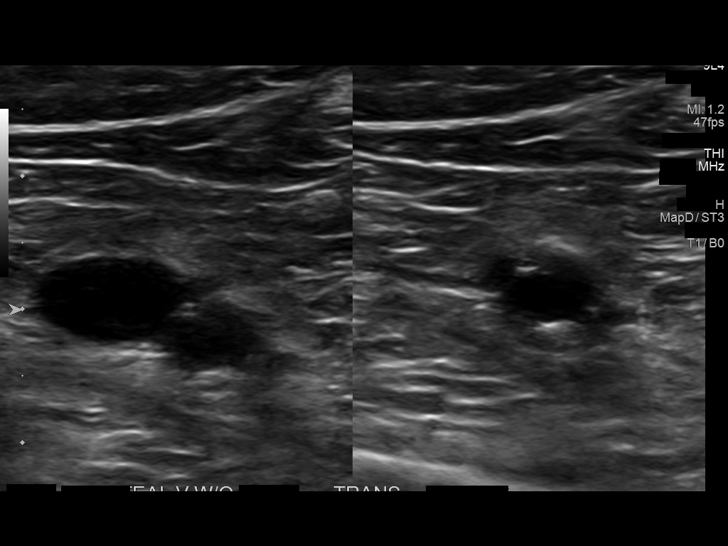
[im 32/44]
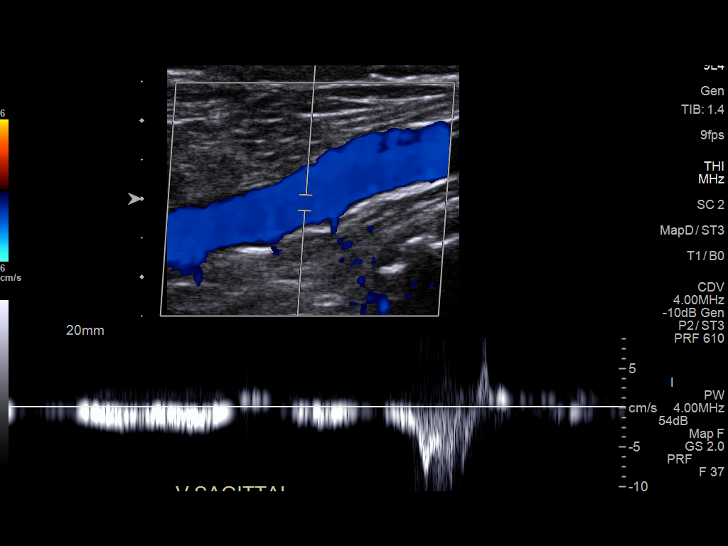
[im 36/44]
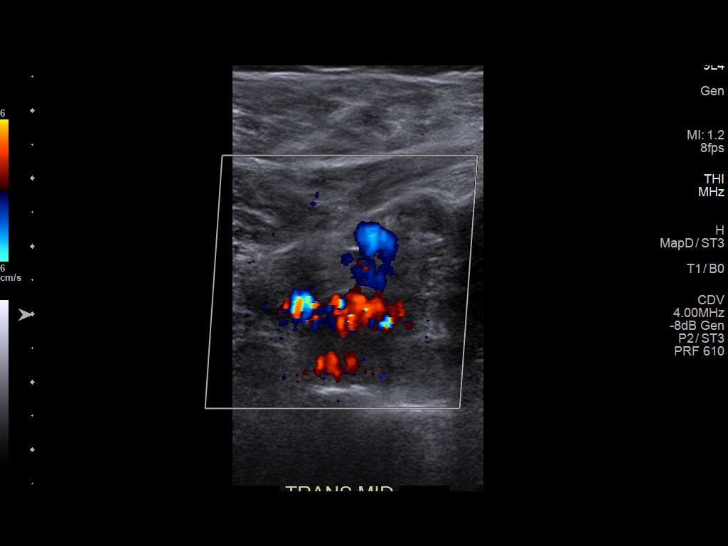
[im 40/44]
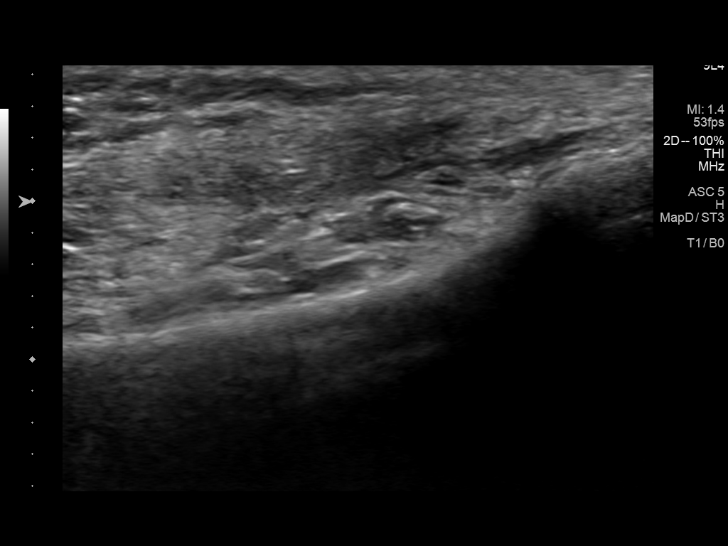
[im 44/44]
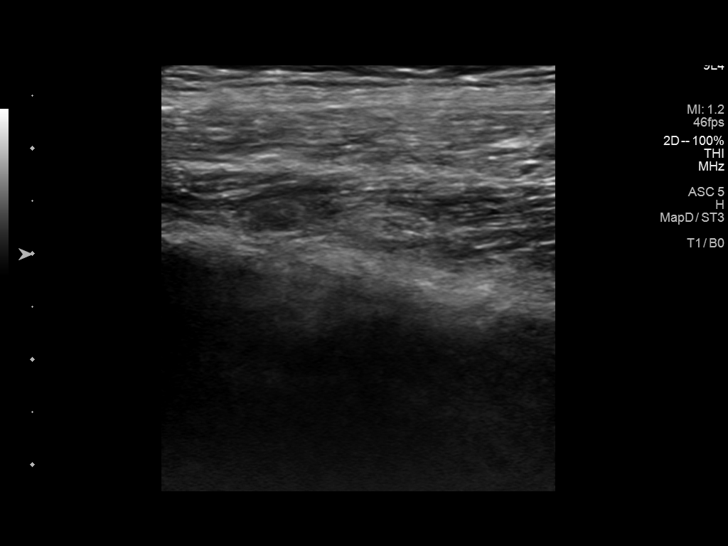

[13 of 24 positions shown; findings below may reference images not displayed]

FINDINGS: Contralateral Common Femoral Vein: Respiratory phasicity is normal
and symmetric with the symptomatic side. No evidence of thrombus.
Normal compressibility.

Common Femoral Vein: No evidence of thrombus. Normal
compressibility, respiratory phasicity and response to augmentation.

Saphenofemoral Junction: No evidence of thrombus. Normal
compressibility and flow on color Doppler imaging.

Profunda Femoral Vein: No evidence of thrombus. Normal
compressibility and flow on color Doppler imaging.

Femoral Vein: No evidence of thrombus. Normal compressibility,
respiratory phasicity and response to augmentation.

Popliteal Vein: No evidence of thrombus. Normal compressibility,
respiratory phasicity and response to augmentation.

Calf Veins: No evidence of thrombus. Normal compressibility and flow
on color Doppler imaging.

Superficial Great Saphenous Vein: No evidence of thrombus. Normal
compressibility and flow on color Doppler imaging.

Venous Reflux:  None.

Other Findings:  None.
IMPRESSION: No evidence of deep venous thrombosis.

## 2016-08-30 IMAGING — CR DG CHEST 2V
2 series · 2 of 2 positions shown · non-contrast
Comparison: CT 09/08/2013, radiograph 03/03/2013

CLINICAL DATA: Cough, left groin pain

EXAM:
CHEST  2 VIEW

[w chest pa]
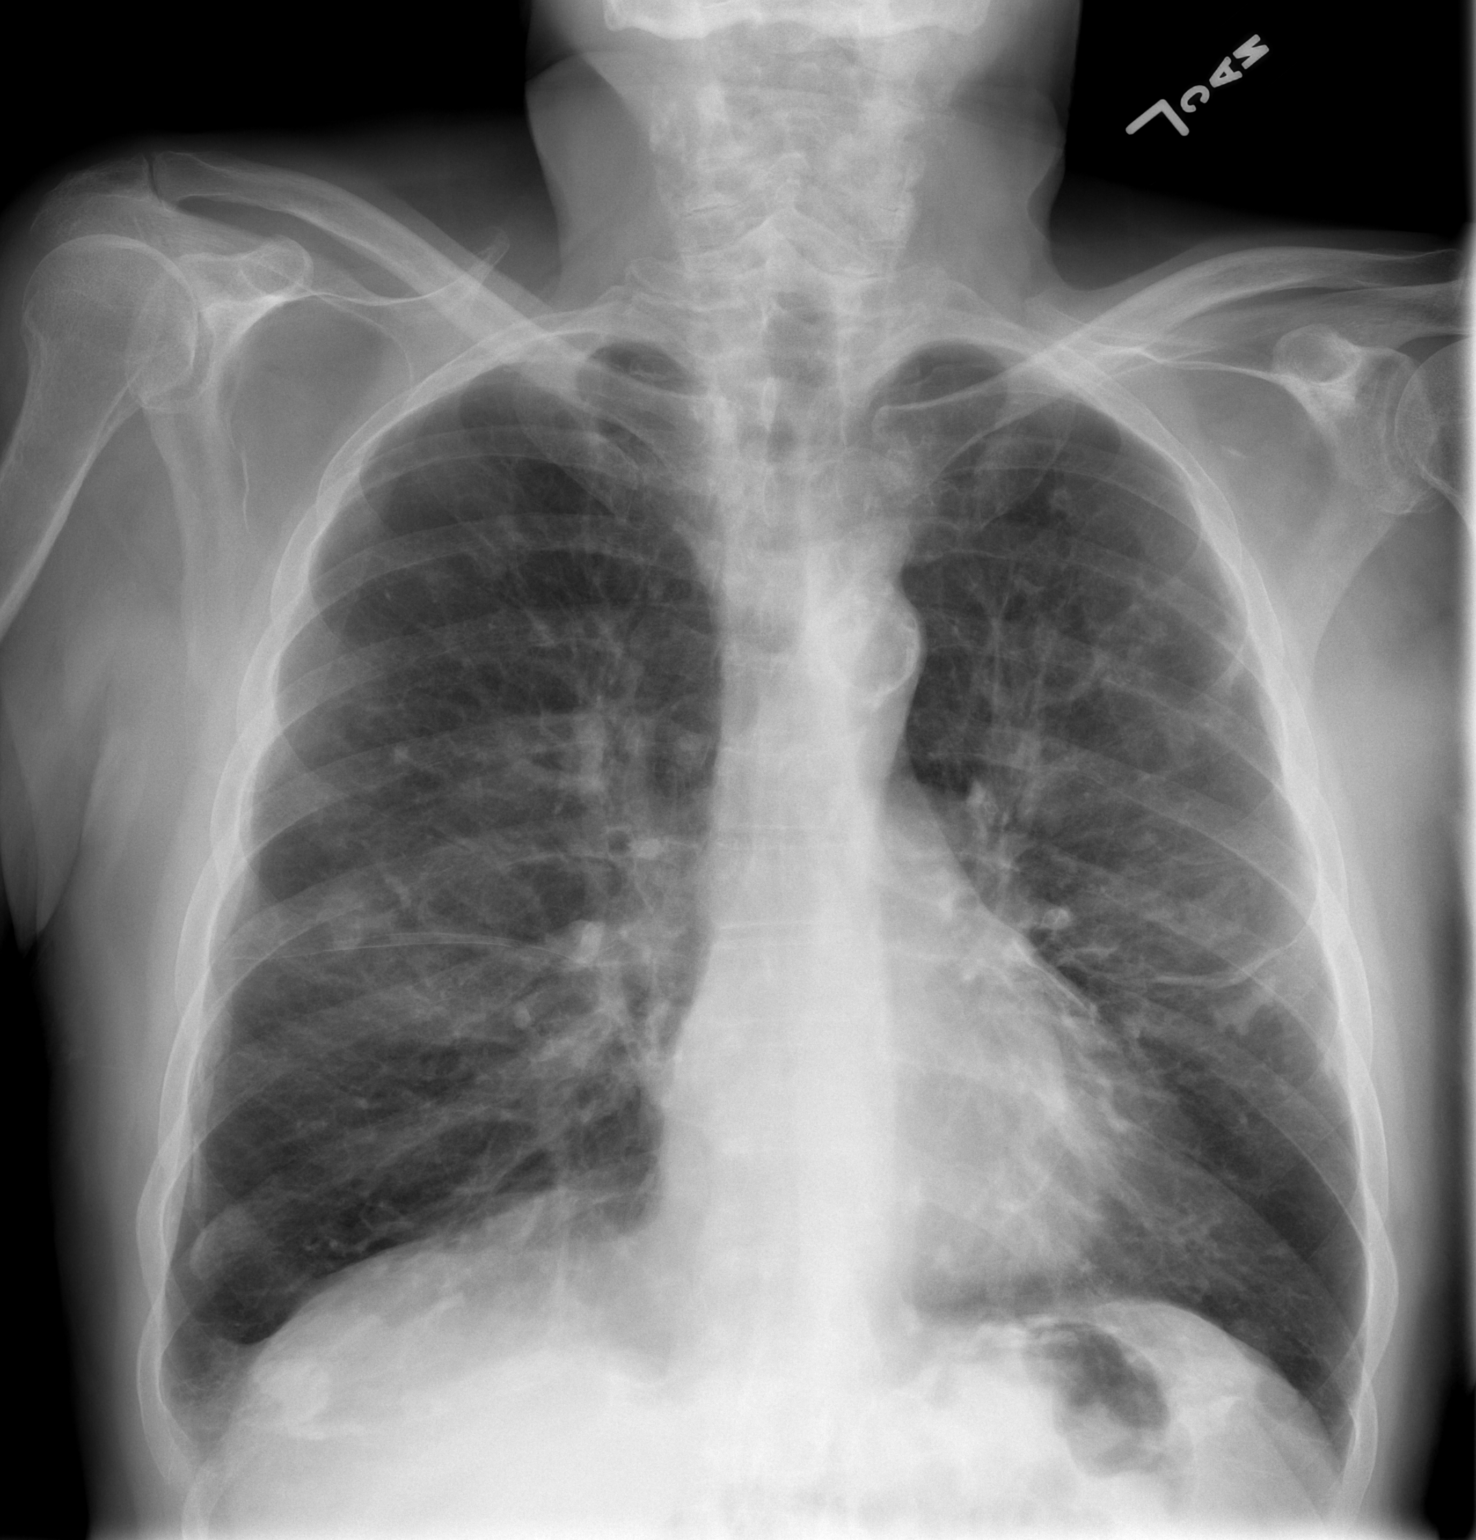

[w chest lat]
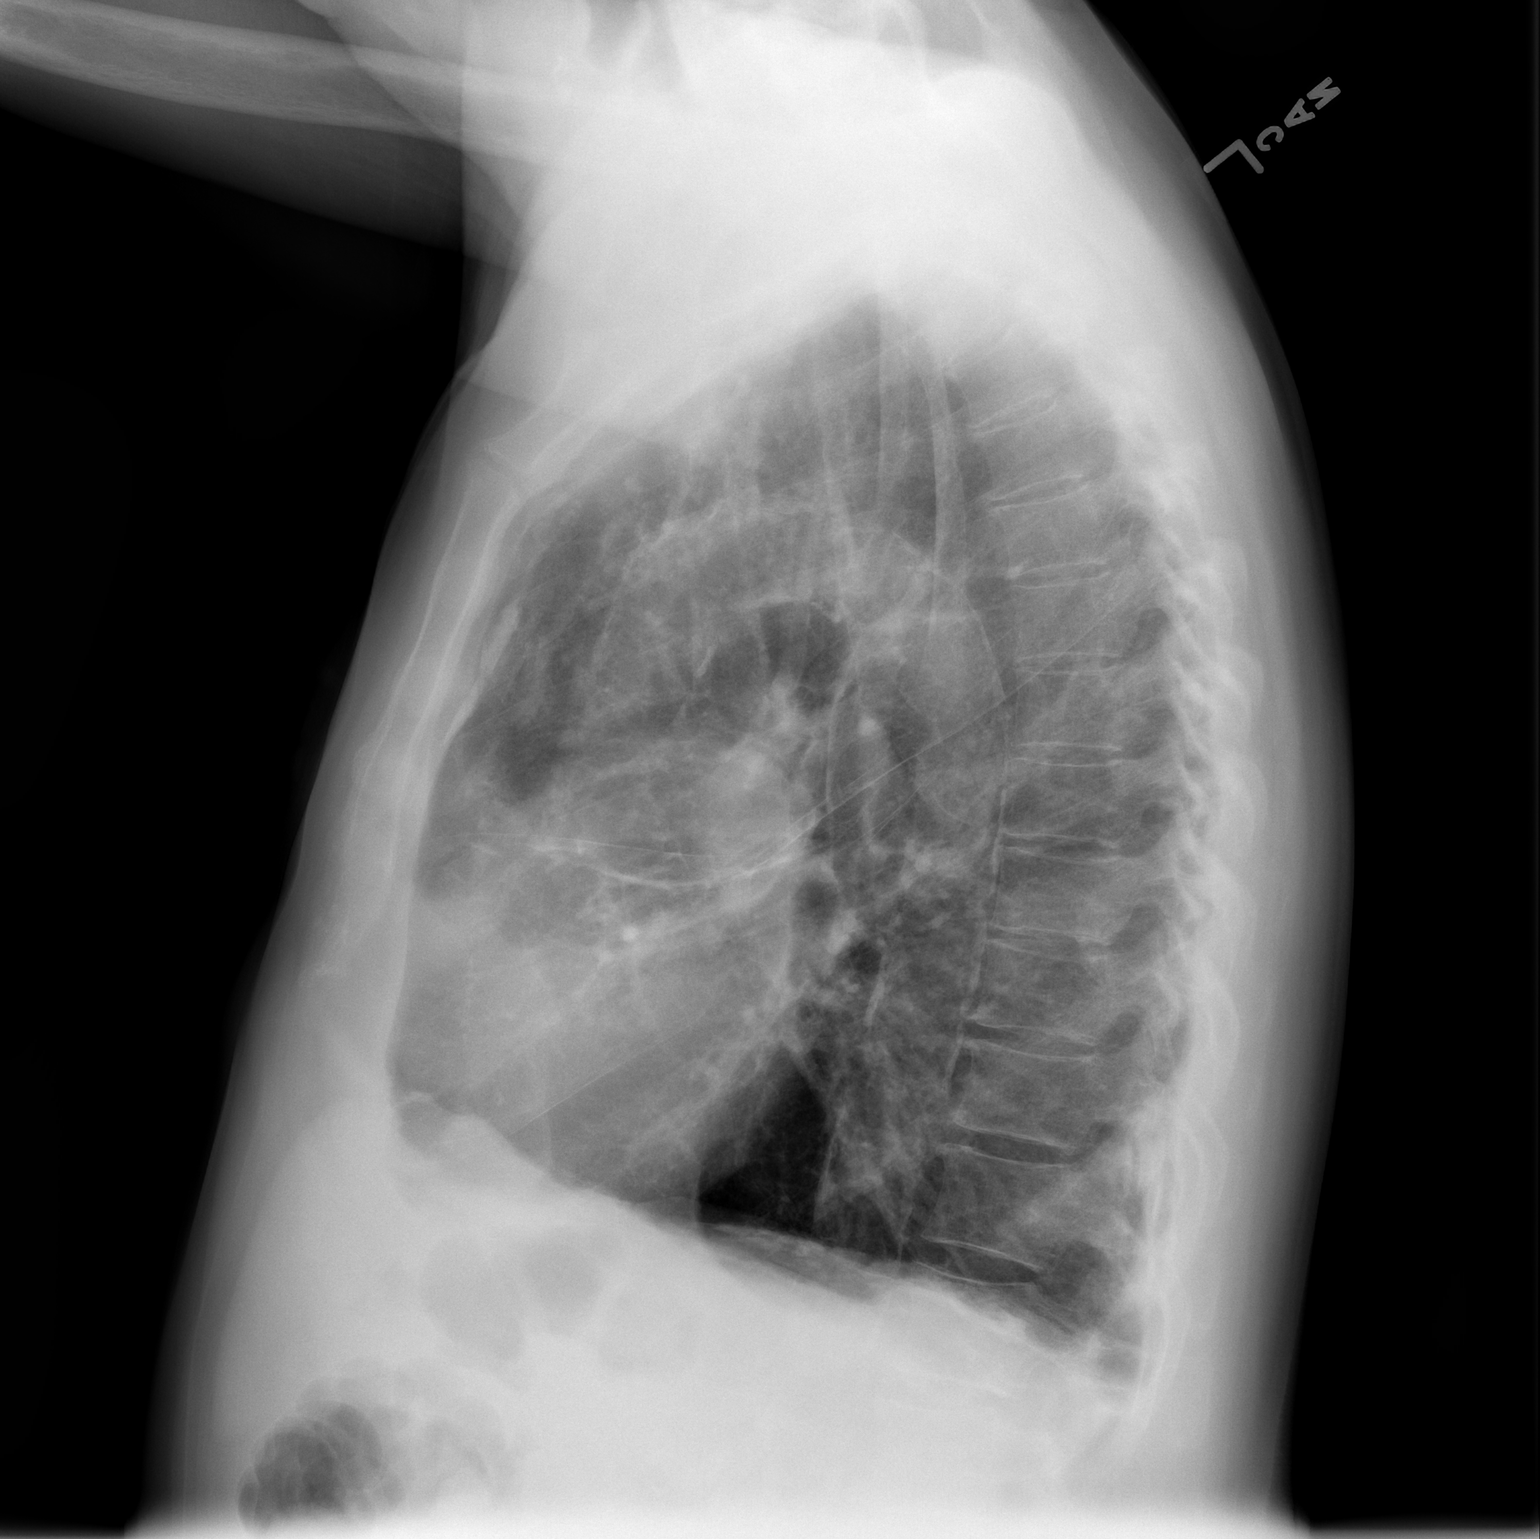

[2 of 2 positions shown; findings below may reference images not displayed]

FINDINGS: Normal cardiac silhouette. There is extensive pleural plaques within
the left and right lung not changed from comparison CT. There is
central bronchovascular thickening is also unchanged. Lungs are
hyperinflated. Calcification aorta noted.
IMPRESSION: 1.  No acute cardiopulmonary process.
2. Hyperinflated lungs with chronic bronchitic markings.
3. Extensive pleural plaques noted.

## 2016-09-01 ENCOUNTER — Other Ambulatory Visit: Payer: Self-pay | Admitting: Family Medicine

## 2016-09-01 DIAGNOSIS — J441 Chronic obstructive pulmonary disease with (acute) exacerbation: Secondary | ICD-10-CM

## 2016-09-05 ENCOUNTER — Inpatient Hospital Stay (HOSPITAL_COMMUNITY)
Admission: EM | Admit: 2016-09-05 | Discharge: 2016-09-08 | DRG: 190 | Disposition: A | Discharge status: Home / Self Care | Payer: Medicare Other | Attending: Internal Medicine | Admitting: Internal Medicine

## 2016-09-05 ENCOUNTER — Encounter (HOSPITAL_COMMUNITY): Payer: Self-pay | Admitting: Nurse Practitioner

## 2016-09-05 ENCOUNTER — Emergency Department (HOSPITAL_COMMUNITY): Payer: Medicare Other

## 2016-09-05 DIAGNOSIS — Z7952 Long term (current) use of systemic steroids: Secondary | ICD-10-CM

## 2016-09-05 DIAGNOSIS — I10 Essential (primary) hypertension: Secondary | ICD-10-CM | POA: Diagnosis present

## 2016-09-05 DIAGNOSIS — F329 Major depressive disorder, single episode, unspecified: Secondary | ICD-10-CM | POA: Diagnosis present

## 2016-09-05 DIAGNOSIS — Z9981 Dependence on supplemental oxygen: Secondary | ICD-10-CM | POA: Diagnosis not present

## 2016-09-05 DIAGNOSIS — J9620 Acute and chronic respiratory failure, unspecified whether with hypoxia or hypercapnia: Secondary | ICD-10-CM | POA: Diagnosis not present

## 2016-09-05 DIAGNOSIS — I739 Peripheral vascular disease, unspecified: Secondary | ICD-10-CM | POA: Diagnosis present

## 2016-09-05 DIAGNOSIS — D500 Iron deficiency anemia secondary to blood loss (chronic): Secondary | ICD-10-CM | POA: Diagnosis present

## 2016-09-05 DIAGNOSIS — Z8249 Family history of ischemic heart disease and other diseases of the circulatory system: Secondary | ICD-10-CM | POA: Diagnosis not present

## 2016-09-05 DIAGNOSIS — K219 Gastro-esophageal reflux disease without esophagitis: Secondary | ICD-10-CM | POA: Diagnosis present

## 2016-09-05 DIAGNOSIS — I2510 Atherosclerotic heart disease of native coronary artery without angina pectoris: Secondary | ICD-10-CM | POA: Diagnosis present

## 2016-09-05 DIAGNOSIS — E785 Hyperlipidemia, unspecified: Secondary | ICD-10-CM | POA: Diagnosis not present

## 2016-09-05 DIAGNOSIS — J9621 Acute and chronic respiratory failure with hypoxia: Secondary | ICD-10-CM | POA: Diagnosis present

## 2016-09-05 DIAGNOSIS — J441 Chronic obstructive pulmonary disease with (acute) exacerbation: Secondary | ICD-10-CM | POA: Diagnosis not present

## 2016-09-05 DIAGNOSIS — Z87891 Personal history of nicotine dependence: Secondary | ICD-10-CM | POA: Diagnosis not present

## 2016-09-05 DIAGNOSIS — Z79899 Other long term (current) drug therapy: Secondary | ICD-10-CM | POA: Diagnosis not present

## 2016-09-05 DIAGNOSIS — Z77090 Contact with and (suspected) exposure to asbestos: Secondary | ICD-10-CM | POA: Diagnosis present

## 2016-09-05 DIAGNOSIS — F419 Anxiety disorder, unspecified: Secondary | ICD-10-CM | POA: Diagnosis present

## 2016-09-05 DIAGNOSIS — R069 Unspecified abnormalities of breathing: Secondary | ICD-10-CM | POA: Diagnosis not present

## 2016-09-05 DIAGNOSIS — R0902 Hypoxemia: Secondary | ICD-10-CM | POA: Diagnosis not present

## 2016-09-05 DIAGNOSIS — D508 Other iron deficiency anemias: Secondary | ICD-10-CM | POA: Diagnosis not present

## 2016-09-05 DIAGNOSIS — Z7951 Long term (current) use of inhaled steroids: Secondary | ICD-10-CM | POA: Diagnosis not present

## 2016-09-05 DIAGNOSIS — Z952 Presence of prosthetic heart valve: Secondary | ICD-10-CM

## 2016-09-05 DIAGNOSIS — R0602 Shortness of breath: Secondary | ICD-10-CM | POA: Diagnosis not present

## 2016-09-05 LAB — I-STAT TROPONIN, ED: Troponin i, poc: 0.04 ng/mL (ref 0.00–0.08)

## 2016-09-05 LAB — CBC
HCT: 41.1 % (ref 39.0–52.0)
HCT: 37.7 % — ABNORMAL LOW (ref 39.0–52.0)
HEMOGLOBIN: 13.2 g/dL (ref 13.0–17.0)
HEMOGLOBIN: 12.4 g/dL — AB (ref 13.0–17.0)
MCH: 31.4 pg (ref 26.0–34.0)
MCH: 32 pg (ref 26.0–34.0)
MCHC: 32.1 g/dL (ref 30.0–36.0)
MCHC: 32.9 g/dL (ref 30.0–36.0)
MCV: 97.9 fL (ref 78.0–100.0)
MCV: 97.4 fL (ref 78.0–100.0)
PLATELETS: 169 10*3/uL (ref 150–400)
Platelets: 172 10*3/uL (ref 150–400)
RBC: 4.2 MIL/uL — AB (ref 4.22–5.81)
RBC: 3.87 MIL/uL — AB (ref 4.22–5.81)
RDW: 14.3 % (ref 11.5–15.5)
RDW: 14.1 % (ref 11.5–15.5)
WBC: 10 10*3/uL (ref 4.0–10.5)
WBC: 13.6 10*3/uL — AB (ref 4.0–10.5)

## 2016-09-05 LAB — BASIC METABOLIC PANEL
ANION GAP: 10 (ref 5–15)
BUN: 12 mg/dL (ref 6–20)
CALCIUM: 9.2 mg/dL (ref 8.9–10.3)
CO2: 24 mmol/L (ref 22–32)
Chloride: 105 mmol/L (ref 101–111)
Creatinine, Ser: 0.91 mg/dL (ref 0.61–1.24)
GFR calc non Af Amer: >60 mL/min (ref >60)
Glucose, Bld: 120 mg/dL — ABNORMAL HIGH (ref 65–99)
POTASSIUM: 3.6 mmol/L (ref 3.5–5.1)
SODIUM: 139 mmol/L (ref 135–145)

## 2016-09-05 LAB — BRAIN NATRIURETIC PEPTIDE: B Natriuretic Peptide: 302.8 pg/mL — ABNORMAL HIGH (ref 0.0–100.0)

## 2016-09-05 LAB — CREATININE, SERUM
CREATININE: 1.08 mg/dL (ref 0.61–1.24)
GFR calc non Af Amer: >60 mL/min (ref >60)

## 2016-09-05 MED ORDER — PREDNISONE 20 MG PO TABS: ORAL_TABLET | ORAL | 0 refills | Status: DC

## 2016-09-05 MED ORDER — BUDESONIDE 0.5 MG/2ML IN SUSP
0.5000 mg | Freq: Every day | RESPIRATORY_TRACT | Status: DC
  Administered 2016-09-05 – 2016-09-08 (×4): 0.5 mg via RESPIRATORY_TRACT
  Filled 2016-09-05 (×4): qty 2

## 2016-09-05 MED ORDER — DOCUSATE SODIUM 100 MG PO CAPS
200.0000 mg | ORAL_CAPSULE | Freq: Every day | ORAL | Status: DC
  Administered 2016-09-05 – 2016-09-07 (×3): 200 mg via ORAL
  Filled 2016-09-05 (×3): qty 2

## 2016-09-05 MED ORDER — FERROUS SULFATE 325 (65 FE) MG PO TBEC: 325.0000 mg | DELAYED_RELEASE_TABLET | Freq: Two times a day (BID) | ORAL | Status: DC

## 2016-09-05 MED ORDER — DOCUSATE SODIUM 100 MG PO CAPS
100.0000 mg | ORAL_CAPSULE | Freq: Every day | ORAL | Status: DC
  Administered 2016-09-06 – 2016-09-08 (×3): 100 mg via ORAL
  Filled 2016-09-05 (×3): qty 1

## 2016-09-05 MED ORDER — ATORVASTATIN CALCIUM 40 MG PO TABS
40.0000 mg | ORAL_TABLET | Freq: Every day | ORAL | Status: DC
  Administered 2016-09-05 – 2016-09-07 (×3): 40 mg via ORAL
  Filled 2016-09-05 (×3): qty 1

## 2016-09-05 MED ORDER — FAMOTIDINE 20 MG PO TABS
20.0000 mg | ORAL_TABLET | Freq: Every day | ORAL | Status: DC
  Administered 2016-09-05 – 2016-09-07 (×3): 20 mg via ORAL
  Filled 2016-09-05 (×3): qty 1

## 2016-09-05 MED ORDER — ASPIRIN EC 81 MG PO TBEC
81.0000 mg | DELAYED_RELEASE_TABLET | ORAL | Status: DC
  Administered 2016-09-06 – 2016-09-08 (×3): 81 mg via ORAL
  Filled 2016-09-05 (×3): qty 1

## 2016-09-05 MED ORDER — ENOXAPARIN SODIUM 40 MG/0.4ML ~~LOC~~ SOLN
40.0000 mg | SUBCUTANEOUS | Status: DC
  Administered 2016-09-05 – 2016-09-07 (×3): 40 mg via SUBCUTANEOUS
  Filled 2016-09-05 (×3): qty 0.4

## 2016-09-05 MED ORDER — PANTOPRAZOLE SODIUM 40 MG PO TBEC
40.0000 mg | DELAYED_RELEASE_TABLET | Freq: Every day | ORAL | Status: DC
  Administered 2016-09-05 – 2016-09-08 (×4): 40 mg via ORAL
  Filled 2016-09-05 (×4): qty 1

## 2016-09-05 MED ORDER — FENOFIBRATE 160 MG PO TABS
160.0000 mg | ORAL_TABLET | Freq: Every day | ORAL | Status: DC
  Administered 2016-09-06 – 2016-09-08 (×3): 160 mg via ORAL
  Filled 2016-09-05 (×4): qty 1

## 2016-09-05 MED ORDER — VITAMIN C 500 MG PO TABS
500.0000 mg | ORAL_TABLET | Freq: Every day | ORAL | Status: DC
  Administered 2016-09-06 – 2016-09-08 (×3): 500 mg via ORAL
  Filled 2016-09-05 (×4): qty 1

## 2016-09-05 MED ORDER — IPRATROPIUM-ALBUTEROL 0.5-2.5 (3) MG/3ML IN SOLN
3.0000 mL | Freq: Four times a day (QID) | RESPIRATORY_TRACT | Status: DC
  Administered 2016-09-05: 3 mL via RESPIRATORY_TRACT
  Filled 2016-09-05: qty 3

## 2016-09-05 MED ORDER — IPRATROPIUM-ALBUTEROL 0.5-2.5 (3) MG/3ML IN SOLN: 3.0000 mL | RESPIRATORY_TRACT | Status: DC | PRN

## 2016-09-05 MED ORDER — IPRATROPIUM-ALBUTEROL 0.5-2.5 (3) MG/3ML IN SOLN
3.0000 mL | Freq: Two times a day (BID) | RESPIRATORY_TRACT | Status: DC
  Administered 2016-09-06: 3 mL via RESPIRATORY_TRACT
  Filled 2016-09-05: qty 3

## 2016-09-05 MED ORDER — ASPIRIN EC 81 MG PO TBEC: 81.0000 mg | DELAYED_RELEASE_TABLET | ORAL | Status: DC

## 2016-09-05 MED ORDER — CITALOPRAM HYDROBROMIDE 20 MG PO TABS
20.0000 mg | ORAL_TABLET | Freq: Every day | ORAL | Status: DC
  Administered 2016-09-06 – 2016-09-08 (×3): 20 mg via ORAL
  Filled 2016-09-05 (×4): qty 1

## 2016-09-05 MED ORDER — ACETAMINOPHEN 650 MG RE SUPP: 650.0000 mg | Freq: Four times a day (QID) | RECTAL | Status: DC | PRN

## 2016-09-05 MED ORDER — FERROUS SULFATE 325 (65 FE) MG PO TABS
325.0000 mg | ORAL_TABLET | Freq: Two times a day (BID) | ORAL | Status: DC
  Administered 2016-09-05 – 2016-09-07 (×5): 325 mg via ORAL
  Filled 2016-09-05 (×5): qty 1

## 2016-09-05 MED ORDER — SODIUM CHLORIDE 0.9% FLUSH
3.0000 mL | Freq: Two times a day (BID) | INTRAVENOUS | Status: DC
  Administered 2016-09-05 – 2016-09-08 (×7): 3 mL via INTRAVENOUS

## 2016-09-05 MED ORDER — METHYLPREDNISOLONE SODIUM SUCC 40 MG IJ SOLR
40.0000 mg | Freq: Four times a day (QID) | INTRAMUSCULAR | Status: DC
  Administered 2016-09-05 – 2016-09-06 (×4): 40 mg via INTRAVENOUS
  Filled 2016-09-05 (×4): qty 1

## 2016-09-05 MED ORDER — DOCUSATE SODIUM 100 MG PO CAPS: 100.0000 mg | ORAL_CAPSULE | ORAL | Status: DC

## 2016-09-05 MED ORDER — FUROSEMIDE 20 MG PO TABS
20.0000 mg | ORAL_TABLET | Freq: Every day | ORAL | Status: DC
  Administered 2016-09-05 – 2016-09-08 (×4): 20 mg via ORAL
  Filled 2016-09-05 (×4): qty 1

## 2016-09-05 MED ORDER — ACETAMINOPHEN 325 MG PO TABS
650.0000 mg | ORAL_TABLET | Freq: Four times a day (QID) | ORAL | Status: DC | PRN
  Filled 2016-09-05: qty 2

## 2016-09-05 MED ORDER — ALPRAZOLAM 0.5 MG PO TABS
0.5000 mg | ORAL_TABLET | Freq: Three times a day (TID) | ORAL | Status: DC | PRN
  Administered 2016-09-05 – 2016-09-06 (×2): 0.5 mg via ORAL
  Filled 2016-09-05 (×3): qty 1

## 2016-09-05 MED ORDER — ARFORMOTEROL TARTRATE 15 MCG/2ML IN NEBU
15.0000 ug | INHALATION_SOLUTION | Freq: Two times a day (BID) | RESPIRATORY_TRACT | Status: DC
  Administered 2016-09-05 – 2016-09-08 (×6): 15 ug via RESPIRATORY_TRACT
  Filled 2016-09-05 (×6): qty 2

## 2016-09-05 MED ORDER — ALBUTEROL (5 MG/ML) CONTINUOUS INHALATION SOLN
10.0000 mg/h | INHALATION_SOLUTION | RESPIRATORY_TRACT | Status: DC
  Administered 2016-09-05: 10 mg/h via RESPIRATORY_TRACT
  Filled 2016-09-05: qty 20

## 2016-09-05 MED ORDER — BISOPROLOL FUMARATE 5 MG PO TABS
2.5000 mg | ORAL_TABLET | Freq: Every day | ORAL | Status: DC
Start: 2016-09-05 — End: 2016-09-08
  Administered 2016-09-05 – 2016-09-08 (×4): 2.5 mg via ORAL
  Filled 2016-09-05 (×4): qty 1

## 2016-09-05 MED ORDER — ALPRAZOLAM 0.25 MG PO TABS
0.5000 mg | ORAL_TABLET | Freq: Once | ORAL | Status: AC
  Administered 2016-09-05: 0.5 mg via ORAL
  Filled 2016-09-05: qty 2

## 2016-09-05 NOTE — ED Triage Notes (Signed)
EMS came to patient earlier tonight due to shortness of breath EMS noted patients oxygen was off. Placed back on 2L of O2 nasal cannula. Called out again this morning for worsening shortness of breath- EMS noted wheezing throughout with no improvement after 20mg  of albuterol and 1mg  of atrovent- 2 duonebs and an albuterol treatment. Patient also received 125mg  of solumederol. Patient had minimal improvement.

## 2016-09-05 NOTE — ED Provider Notes (Signed)
[facility] DEPT Provider Note   CSN: [id number] Arrival date & time: 09/05/16  7001     History   Chief Complaint Chief Complaint  Patient presents with  . Shortness of Breath    HPI Ronald Bell is a 76 y.o. male.  HPI Patient with history of COPD and wears 2 L of oxygen at night presents with worsening shortness of breath yesterday and throughout the night. He used his nebulizer with little improvement. EMS was called and noted wheezing throughout. Given albuterol and Atrovent as well as Solu-Medrol. Patient states he's had improvement of his shortness of breath. Denied chest pain at any point. No new lower extremity swelling or pain. No fever or chills. Past Medical History:  Diagnosis Date  . Angiodysplasia of intestine with hemorrhage    large and SB, gastric AVMs.   . Anxiety   . Arthritis    "left shoulder" (10/19/2014)  . Carotid artery occlusion   . Carotid artery stenosis 04/22/2012  . COPD GOLD III with min reversibilty  08/11/2006   Followed in Pulmonary clinic/ [facility]/ [place] - PFT's 04/28/2013  FEV1 0.88 (40%) with ratio 44 and 14% better p B2 dlco 45 corrects to 83 - Trial of breo 04/28/2013 > improved symptoms  06/09/2013  - spirometry 06/04/2014 FEV1  0.76 (29%) ratio 45      . Coronary artery disease   . GERD 11/30/2008   Qualifier: Diagnosis of  By: Marijean Niemann CMA, Danielle    . GERD (gastroesophageal reflux disease)   . GI bleed 2010   4 units PRBCs  . History of blood transfusion "couple times"   "related to bleeding in colon and esophagus"  . Hx of adenomatous colonic polyps 2012, 2013.   Marland Kitchen Hyperlipidemia   . Hypertension   . Iron deficiency anemia 01/25/2009   Qualifier: Diagnosis of  By: Henrene Pastor MD, Docia Chuck   . Irregular heartbeat   . On home oxygen therapy    "2L at night" (07/23/2015)  . Peripheral vascular disease ([place])   . Pleural plaque with presence of asbestos 03/27/2013   Followed in Pulmonary clinic/ [facility]/ [place] - F/u CT  09/08/2013 1. Stable extensive calcified pleural plaque formation consistent with asbestos exposure. 2. Multiple pulmonary nodules are unchanged from the CT of 6 months ago. Given risk factors for lung cancer, continued follow up is recommended with chest CT in 6 months> done 04/20/14 no change >repeat in 12 m in tickle file     . Polyp of nasal cavity   . PVD (peripheral vascular disease) ([place]) 10/18/2012  . Rosacea   . 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 Shingles   . Tobacco abuse     Patient Active Problem List   Diagnosis Date Noted  . Pulmonary fibrosis ([place])   . Acute exacerbation of chronic obstructive pulmonary disease (COPD) ([place])   . Acute renal failure ([place])   . COPD with acute exacerbation ([place]) 07/20/2015  . Paroxysmal atrial fibrillation ([place]) 07/20/2015  . Chronic diastolic CHF (congestive heart failure) ([place]) 07/20/2015  . AKI (acute kidney injury) ([place]) 07/20/2015  . COPD with exacerbation ([place]) 07/20/2015  . Mixed hyperlipidemia 05/27/2015  . Depression 05/18/2015  . Acute on chronic respiratory failure ([place]) 05/18/2015  . Elevated lactic acid level 05/18/2015  . Chronic respiratory failure with hypoxia and hypercapnia ([place]) 01/10/2015  . Pleural effusion, left   . LVH (  left ventricular hypertrophy)   . AVM (arteriovenous malformation) of small bowel, acquired with hemorrhage ([place])   . History of colonic polyps   . CAD in native artery   . S/P aortic valve replacement   . Irregular heartbeat   . Pleural effusion   . Other emphysema ([place])   . CAD (coronary artery disease) 12/03/2014  . Atrial fibrillation with rapid ventricular response ([place]) 11/23/2014  . 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 Former smoker 11/05/2014  . Unstable angina ([place]) 10/19/2014  . Angiodysplasia of stomach and duodenum   . Angiodysplasia of small  intestine, except duodenum without bleeding   . Melena 08/16/2014  . Preop cardiovascular exam 08/16/2014  . Aortic stenosis   . Coronary artery disease involving native coronary artery of native heart without angina pectoris   . Angiodysplasia of colon   . Severe aortic stenosis 06/21/2014  . Shortness of breath   . Aortic stenosis, severe 05/10/2014  . COPD exacerbation ([place]) 04/16/2014  . Multiple pulmonary nodules 03/02/2014  . 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 Pain of left thigh 11/14/2013  . Cough 03/27/2013  . Pleural plaque with presence of asbestos 03/27/2013  . PVD (peripheral vascular disease) ([place]) 10/18/2012  . Carotid artery stenosis 04/22/2012  . Postherpetic neuralgia 09/28/2011  . OTHER SPECIFIED DISEASE OF WHITE BLOOD CELLS 01/17/2010  . HEMOCCULT POSITIVE STOOL 01/17/2010  . Iron deficiency anemia 01/25/2009  . [facility] 01/25/2009  . ANGIODYSPLASIA-INTESTINE 01/25/2009  . PERSONAL HX COLONIC POLYPS 01/25/2009  . ANEMIA, SECONDARY TO BLOOD LOSS 12/03/2008  . Hemorrhage of gastrointestinal tract 12/03/2008  . UNSPECIFIED ANEMIA 12/01/2008  . GERD 11/30/2008  . Bilateral carotid bruits 05/25/2008  . POLYP OF NASAL CAVITY 07/25/2007  . OTHER SPECIFIED DISORDERS OF ARTERIES&ARTERIOLES 06/24/2007  . NECK PAIN, LEFT 06/22/2007  . Benign neoplasm of colon 08/11/2006  . Hyperlipidemia 08/11/2006  . ERECTILE DYSFUNCTION 08/11/2006  . Essential hypertension 08/11/2006  . COPD GOLD III with min reversibilty  08/11/2006  . 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 BARRETT'S ESOPHAGUS, HX OF 08/11/2006  . Cigarette smoker 08/11/2006    Past Surgical History:  Procedure Laterality Date  . CARDIAC CATHETERIZATION  2001; 06/28/2014  . CARDIAC CATHETERIZATION N/A 10/19/2014   Procedure: Coronary Stent Intervention;  Surgeon: Burnell Blanks, MD;  Location: [facility];  Service: Cardiovascular;  Laterality: N/A;  BMS Mid RCA  . COLONOSCOPY   July 2015   Dr. Henrene Pastor  . COLONOSCOPY N/A 08/17/2014   Procedure: COLONOSCOPY;  Surgeon: Jerene Bears, MD;  Location[facility] ENDOSCOPY;  Service: Endoscopy;  Laterality: N/A;  . COLONOSCOPY N/A 12/05/2014   Procedure: COLONOSCOPY;  Surgeon: Manus Gunning, MD;  Location: [facility];  Service: Gastroenterology;  Laterality: N/A;  . ENTEROSCOPY N/A 12/05/2014   Procedure: ENTEROSCOPY;  Surgeon: Manus Gunning, MD;  Location: [facility] ENDOSCOPY;  Service: Gastroenterology;  Laterality: N/A;  . ESOPHAGOGASTRODUODENOSCOPY  2012   normal  . ESOPHAGOGASTRODUODENOSCOPY N/A 08/17/2014   Procedure: ESOPHAGOGASTRODUODENOSCOPY (EGD);  Surgeon: Jerene Bears, MD;  Location: [facility] ENDOSCOPY;  Service: Endoscopy;  Laterality: N/A;  . ILIAC ARTERY STENT Left 2005   CIA  . KNEE ARTHROSCOPY WITH MEDIAL MENISECTOMY Left 03/08/2014   Procedure: LEFT KNEE SCOPE WITH MEDIAL MENISECTOMY AND CHONDROPLASTY;  Surgeon: Ninetta Lights, MD;  Location: [facility];  Service: Orthopedics;  Laterality: Left;  . LEFT AND RIGHT HEART CATHETERIZATION WITH CORONARY ANGIOGRAM N/A 06/28/2014   Procedure: LEFT AND RIGHT HEART CATHETERIZATION WITH CORONARY ANGIOGRAM;  Surgeon: Jerline Pain, MD;  Location: [facility] CATH LAB;  Service: Cardiovascular;  Laterality: N/A;  . RIB PLATING Left 11/20/2014   Procedure: RIB PLATING OF LEFT 8TH RIB;  Surgeon: [name], MD;  Location: [facility];  Service: Open Heart Surgery;  Laterality: Left;  . TEE WITHOUT CARDIOVERSION N/A 11/20/2014   Procedure: TRANSESOPHAGEAL ECHOCARDIOGRAM (TEE);  Surgeon: [name], MD;  Location: [facility];  Service: Open Heart Surgery;  Laterality: N/A;  . TEE WITHOUT CARDIOVERSION N/A 12/19/2015   Procedure: TRANSESOPHAGEAL ECHOCARDIOGRAM (TEE);  Surgeon: Jerline Pain, MD;  Location: [facility] ENDOSCOPY;  Service: Cardiovascular;  Laterality: N/A;  . TONSILLECTOMY    . TRANSCATHETER AORTIC VALVE REPLACEMENT, TRANSAPICAL N/A 11/20/2014   Procedure: TRANSCATHETER AORTIC VALVE  REPLACEMENT, TRANSAPICAL;  Surgeon: [name], MD;  Location: [facility];  Service: Open Heart Surgery;  Laterality: N/A;       Home Medications    Prior to Admission medications   Medication Sig Start Date End Date Taking? Authorizing Provider  albuterol (PROVENTIL HFA;VENTOLIN HFA) 108 (90 Base) MCG/ACT inhaler Inhale 2 puffs into the lungs every 6 (six) hours as needed for wheezing or shortness of breath (Plan B). 12/08/15  Yes Roma Schanz R, DO  ALPRAZolam (XANAX) 0.5 MG tablet TAKE 1 TABLET BY MOUTH THREE TIMES DAILY AS NEEDED FOR ANXIETY Patient taking differently: TAKE 0.5 MG BY MOUTH THREE TIMES DAILY AS NEEDED FOR ANXIETY 08/04/16  Yes Ann Held, DO  aspirin 81 MG tablet Take 81 mg by mouth every morning.    Yes [provider]  atorvastatin (LIPITOR) 40 MG tablet Take 1 tablet (40 mg total) by mouth daily. 12/08/15  Yes Roma Schanz R, DO  bisoprolol (ZEBETA) 5 MG tablet Take 0.5 tablets (2.5 mg total) by mouth daily. 06/16/16  Yes Jerline Pain, MD  BROVANA 15 MCG/2ML NEBU USE ONE VIAL IN NEBULIZER TWICE DAILY 07/17/16  Yes Tanda Rockers, MD  budesonide (PULMICORT) 0.5 MG/2ML nebulizer solution USE ONE VIAL (2ML) IN NEBULIZER TWICE DAILY 09/01/16  Yes Lowne Lyndal Pulley R, DO  citalopram (CELEXA) 20 MG tablet Take 1 tablet (20 mg total) by mouth daily. 07/14/16  Yes Roma Schanz R, DO  docusate sodium (COLACE) 100 MG capsule Take 100-200 mg by mouth See admin instructions. 1 capsule by mouth in the morning and 2 at bedtime    Yes [provider]  fenofibrate 160 MG tablet TAKE ONE TABLET BY MOUTH ONCE DAILY 07/24/16  Yes Roma Schanz R, DO  ferrous sulfate 325 (65 FE) MG EC tablet Take 1 tablet (325 mg total) by mouth 2 (two) times daily. 05/09/15  Yes Roma Schanz R, DO  furosemide (LASIX) 20 MG tablet Take 20 mg by mouth daily.   Yes [provider]  Glycopyrrolate (LONHALA MAGNAIR REFILL KIT) 25 MCG/ML SOLN  Inhale 1 Dose into the lungs 2 (two) times daily.   Yes [provider]  ipratropium-albuterol (DUONEB) 0.5-2.5 (3) MG/3ML SOLN Take 3 mLs by nebulization every 4 (four) hours as needed (shortness of breath and wheezing plan C).   Yes [provider]  Multiple Vitamins-Minerals (CENTRUM SILVER PO) Take 1 tablet by mouth daily.    Yes [provider]  OXYGEN Use 2L at bedtime and as needed for shortness of breath   Yes [provider]  pantoprazole (PROTONIX) 40 MG tablet Take 1 tablet (40 mg total) by mouth daily. 12/06/15  Yes Roma Schanz  R, DO  sodium chloride (OCEAN) 0.65 % SOLN nasal spray Place 1-2 sprays into both nostrils as needed for congestion.    Yes [provider]  temazepam (RESTORIL) 30 MG capsule TAKE ONE CAPSULE BY MOUTH AT BEDTIME AS NEEDED FOR SLEEP Patient taking differently: TAKE 30 MG BY MOUTH AT BEDTIME 06/22/16  Yes Roma Schanz R, DO  vitamin C (ASCORBIC ACID) 500 MG tablet Take 500 mg by mouth daily.   Yes [provider]  famotidine (PEPCID) 20 MG tablet Take 20 mg by mouth at bedtime.     [provider]  predniSONE (DELTASONE) 20 MG tablet 3 tabs po day one, then 2 po daily x 4 days 09/06/16   Julianne Rice, MD    Family History Family History  Problem Relation Age of Onset  . Lung disease Mother        pulm fibrosis  . Colitis Father   . Heart disease Brother   . Hypertension Brother   . Hyperlipidemia Brother   . CAD Daughter        cad  . Hypertension Son   . Colon cancer Neg Hx     Social History Social History  Substance Use Topics  . Smoking status: Former Smoker    Packs/day: 1.00    Years: 60.00    Types: Cigarettes, E-cigarettes    Quit date: 04/18/2015  . Smokeless tobacco: Never Used  . Alcohol use 8.4 oz/week    14 Glasses of wine per week     Allergies   Patient has no known allergies.   Review of Systems Review of Systems  Constitutional: Negative for  chills and fever.  Respiratory: Positive for cough, shortness of breath and wheezing.   Cardiovascular: Negative for chest pain, palpitations and leg swelling.  Gastrointestinal: Negative for abdominal pain, nausea and vomiting.  Musculoskeletal: Negative for back pain, myalgias, neck pain and neck stiffness.  Skin: Negative for rash and wound.  Neurological: Negative for dizziness, weakness, light-headedness, numbness and headaches.  All other systems reviewed and are negative.    Physical Exam Updated Vital Signs BP (!) 144/63   Pulse (!) 119   Temp 97.7 F (36.5 C) (Oral)   Resp (!) 30   Ht 5' 2.5" (1.588 m)   Wt 67.6 kg (149 lb)   SpO2 99%   BMI 26.82 kg/m   Physical Exam  Constitutional: He is oriented to person, place, and time. He appears well-developed and well-nourished.  Chronically ill-appearing  HENT:  Head: Normocephalic and atraumatic.  Mouth/Throat: Oropharynx is clear and moist.  Eyes: EOM are normal. Pupils are equal, round, and reactive to light.  Neck: Normal range of motion. Neck supple.  Cardiovascular: Normal rate and regular rhythm.   Pulmonary/Chest: Effort normal. He has wheezes. He has rales.  Few scattered expiratory wheezes mostly in the upper bilateral lung fields. Scattered crackles in bilateral lower lung fields. No respiratory distress.  Abdominal: Soft. Bowel sounds are normal. There is no tenderness. There is no rebound and no guarding.  Musculoskeletal: Normal range of motion. He exhibits no edema or tenderness.  No lower extremity swelling, asymmetry or tenderness. Distal pulses intact.  Neurological: He is alert and oriented to person, place, and time.  Skin: Skin is warm and dry. Capillary refill takes less than 2 seconds. No rash noted. No erythema.  Psychiatric: He has a normal mood and affect. His behavior is normal.  Nursing note and vitals reviewed.    ED Treatments /  Results  Labs (all labs ordered are listed, but only  abnormal results are displayed) Labs Reviewed  BASIC METABOLIC PANEL - Abnormal; Notable for the following:       Result Value   Glucose, Bld 120 (*)    All other components within normal limits  CBC - Abnormal; Notable for the following:    RBC 4.20 (*)    All other components within normal limits  BRAIN NATRIURETIC PEPTIDE - Abnormal; Notable for the following:    B Natriuretic Peptide 302.8 (*)    All other components within normal limits  I-STAT TROPOININ, ED    EKG  EKG Interpretation None       Radiology Dg Chest Port 1 View  Result Date: 09/05/2016 CLINICAL DATA:  Shortness of breath today. EXAM: PORTABLE CHEST 1 VIEW COMPARISON:  12/09/2015 and chest CT 07/23/2015 FINDINGS: Single view of the chest demonstrates patchy densities throughout both sides of the chest. Findings related to chronic changes and diffuse pleural calcifications. Streaky densities in the mid left lung are chronic. No large areas of new airspace disease. Heart size is within normal limits and stable. Atherosclerotic calcifications at the aortic arch. Patient had a transcatheter aortic valve replacement. Negative for a pneumothorax. IMPRESSION: Chronic changes throughout the chest.  No acute findings. Electronically Signed   By: Markus Daft M.D.   On: 09/05/2016 11:07    Procedures Procedures (including critical care time)  Medications Ordered in ED Medications  albuterol (PROVENTIL,VENTOLIN) solution continuous neb (0 mg/hr Nebulization Stopped 09/05/16 1337)  ALPRAZolam (XANAX) tablet 0.5 mg (0.5 mg Oral Given 09/05/16 1340)    CRITICAL CARE Performed by: Lita Mains, Loraine Bhullar Total critical care time: 20 minutes Critical care time was exclusive of separately billable procedures and treating other patients. Critical care was necessary to treat or prevent imminent or life-threatening deterioration. Critical care was time spent personally by me on the following activities: development of treatment plan  with patient and/or surrogate as well as nursing, discussions with consultants, evaluation of patient's response to treatment, examination of patient, obtaining history from patient or surrogate, ordering and performing treatments and interventions, ordering and review of laboratory studies, ordering and review of radiographic studies, pulse oximetry and re-evaluation of patient's condition. Initial Impression / Assessment and Plan / ED Course  I have reviewed the triage vital signs and the nursing notes.  Pertinent labs & imaging results that were available during my care of the patient were reviewed by me and considered in my medical decision making (see chart for details).     Patient received an hour-long nebulized treatment in the emergency department. When ambulating heart rate up to 130s and oxygen saturation dropped into the mid 80s. Likely combination CHF exacerbation and COPD. We'll discuss with hospitalist regarding admission.  Final Clinical Impressions(s) / ED Diagnoses   Final diagnoses:  COPD exacerbation ([place])    New Prescriptions New Prescriptions   PREDNISONE (DELTASONE) 20 MG TABLET    3 tabs po day one, then 2 po daily x 4 days     Julianne Rice, MD 09/05/16 1433

## 2016-09-05 NOTE — ED Notes (Signed)
RT notified to administer continuous neb

## 2016-09-05 NOTE — Progress Notes (Signed)
Pt fall risk band placed and yellow socks, pt states he does not want the bed alarm on. Pt aware of risks including fall and injury. Pt still refused. Pt oriented to the room.   Kathrynne Kulinski Leory Plowman

## 2016-09-05 NOTE — ED Notes (Signed)
Ambulated the pt to the bathroom in his room, down the hallway, and back to his room.  The pt's SpO2 began at 91% on room air while sitting, and while walking down the hall dropped to 86%.  At that time the pt endorsed dizziness and used the railing to assist with his walking.  While walking, the pt's pulse rate increased to 126, with the highest pulse rate reading at 133.  The pt required moderate assistance getting back to, and into, his bed.  The pt's last SpO2 while sitting was 88% on room air.  While sitting in the bed, the pt was placed on a nasal cannula at 2 L/min and his sats increased to 96%.

## 2016-09-05 NOTE — ED Notes (Signed)
Pt wants to take his 0.5 xanax that he takes three times a day.  He does not have any of his home meds with him.  EDP made aware.

## 2016-09-05 NOTE — ED Notes (Signed)
Pt given a decaf coffee, per Dr. Lita Mains.

## 2016-09-05 NOTE — H&P (Signed)
[facility] History and Physical  CHRISHON MARTINO MPN:[id number] DOB: January 27, 1941 DOA: 09/05/2016  Referring physician:  PCP: Ann Held, DO  Specialists:   Chief Complaint: shortness of breath   HPI: Ronald Bell is a 76 y.o. male with PMH of HTN, Aortic stenosis s/p TAVR, COPD on intermittent night time oxygen at home, presented with shortness of breath, cough and wheezing. He states that he developed progressive shortness of breath, dyspnea on exertion associated with mild productive cough and wheezing for several days. He denies acute chest pains, no fevers. He reports that he quit smoking 1 year ago, but still exposed to second hand smoking to his wife. He denies PNDs, no orthopnea, no leg edema. No nausea, vomiting or diarrhea.  -ED: hypoxia with exertion to 86%. Wheezing. Given steroids, inhalers. hospitalist is called for admission   Review of Systems: The patient denies anorexia, fever, weight loss,, vision loss, decreased hearing, hoarseness, chest pain, syncope, dyspnea on exertion, peripheral edema, balance deficits, hemoptysis, abdominal pain, melena, hematochezia, severe indigestion/heartburn, hematuria, incontinence, genital sores, muscle weakness, suspicious skin lesions, transient blindness, difficulty walking, depression, unusual weight change, abnormal bleeding, enlarged lymph nodes, angioedema, and breast masses.    Past Medical History:  Diagnosis Date  . Angiodysplasia of intestine with hemorrhage    large and SB, gastric AVMs.   . Anxiety   . Arthritis    "left shoulder" (10/19/2014)  . Carotid artery occlusion   . Carotid artery stenosis 04/22/2012  . COPD GOLD III with min reversibilty  08/11/2006   Followed in Pulmonary clinic/ [facility]/ [place] - PFT's 04/28/2013  FEV1 0.88 (40%) with ratio 44 and 14% better p B2 dlco 45 corrects to 83 - Trial of breo 04/28/2013 > improved symptoms  06/09/2013  - spirometry 06/04/2014 FEV1  0.76 (29%) ratio  45      . Coronary artery disease   . GERD 11/30/2008   Qualifier: Diagnosis of  By: Marijean Niemann CMA, Danielle    . GERD (gastroesophageal reflux disease)   . GI bleed 2010   4 units PRBCs  . History of blood transfusion "couple times"   "related to bleeding in colon and esophagus"  . Hx of adenomatous colonic polyps 2012, 2013.   Marland Kitchen Hyperlipidemia   . Hypertension   . Iron deficiency anemia 01/25/2009   Qualifier: Diagnosis of  By: Henrene Pastor MD, Docia Chuck   . Irregular heartbeat   . On home oxygen therapy    "2L at night" (07/23/2015)  . Peripheral vascular disease ([place])   . Pleural plaque with presence of asbestos 03/27/2013   Followed in Pulmonary clinic/ [facility]/ [place] - F/u CT 09/08/2013 1. Stable extensive calcified pleural plaque formation consistent with asbestos exposure. 2. Multiple pulmonary nodules are unchanged from the CT of 6 months ago. Given risk factors for lung cancer, continued follow up is recommended with chest CT in 6 months> done 04/20/14 no change >repeat in 12 m in tickle file     . Polyp of nasal cavity   . PVD (peripheral vascular disease) ([place]) 10/18/2012  . Rosacea   . 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 Shingles   . Tobacco abuse    Past Surgical History:  Procedure Laterality Date  . CARDIAC CATHETERIZATION  2001; 06/28/2014  . CARDIAC CATHETERIZATION N/A 10/19/2014   Procedure: Coronary Stent Intervention;  Surgeon: Burnell Blanks, MD;  Location:  [place];  Service: Cardiovascular;  Laterality: N/A;  BMS Mid RCA  . COLONOSCOPY  July 2015   Dr. Henrene Pastor  . COLONOSCOPY N/A 08/17/2014   Procedure: COLONOSCOPY;  Surgeon: Jerene Bears, MD;  Location: [facility] ENDOSCOPY;  Service: Endoscopy;  Laterality: N/A;  . COLONOSCOPY N/A 12/05/2014   Procedure: COLONOSCOPY;  Surgeon: Manus Gunning, MD;  Location: [place];  Service:  Gastroenterology;  Laterality: N/A;  . ENTEROSCOPY N/A 12/05/2014   Procedure: ENTEROSCOPY;  Surgeon: Manus Gunning, MD;  Location: [facility] ENDOSCOPY;  Service: Gastroenterology;  Laterality: N/A;  . ESOPHAGOGASTRODUODENOSCOPY  2012   normal  . ESOPHAGOGASTRODUODENOSCOPY N/A 08/17/2014   Procedure: ESOPHAGOGASTRODUODENOSCOPY (EGD);  Surgeon: Jerene Bears, MD;  Location: [facility] ENDOSCOPY;  Service: Endoscopy;  Laterality: N/A;  . ILIAC ARTERY STENT Left 2005   CIA  . KNEE ARTHROSCOPY WITH MEDIAL MENISECTOMY Left 03/08/2014   Procedure: LEFT KNEE SCOPE WITH MEDIAL MENISECTOMY AND CHONDROPLASTY;  Surgeon: Ninetta Lights, MD;  Location: [place];  Service: Orthopedics;  Laterality: Left;  . LEFT AND RIGHT HEART CATHETERIZATION WITH CORONARY ANGIOGRAM N/A 06/28/2014   Procedure: LEFT AND RIGHT HEART CATHETERIZATION WITH CORONARY ANGIOGRAM;  Surgeon: Jerline Pain, MD;  Location: [facility] CATH LAB;  Service: Cardiovascular;  Laterality: N/A;  . RIB PLATING Left 11/20/2014   Procedure: RIB PLATING OF LEFT 8TH RIB;  Surgeon: [name], MD;  Location: [place];  Service: Open Heart Surgery;  Laterality: Left;  . TEE WITHOUT CARDIOVERSION N/A 11/20/2014   Procedure: TRANSESOPHAGEAL ECHOCARDIOGRAM (TEE);  Surgeon: [name], MD;  Location: [place];  Service: Open Heart Surgery;  Laterality: N/A;  . TEE WITHOUT CARDIOVERSION N/A 12/19/2015   Procedure: TRANSESOPHAGEAL ECHOCARDIOGRAM (TEE);  Surgeon: Jerline Pain, MD;  Location: [facility] ENDOSCOPY;  Service: Cardiovascular;  Laterality: N/A;  . TONSILLECTOMY    . TRANSCATHETER AORTIC VALVE REPLACEMENT, TRANSAPICAL N/A 11/20/2014   Procedure: TRANSCATHETER AORTIC VALVE REPLACEMENT, TRANSAPICAL;  Surgeon: [name], MD;  Location: [employer];  Service: Open Heart Surgery;  Laterality: N/A;   Social History:  reports that he quit smoking about 16 months ago. His smoking use included Cigarettes and E-cigarettes. He has a 60.00 pack-year smoking history. He has never used  smokeless tobacco. He reports that he drinks about 8.4 oz of alcohol per week . He reports that he does not use drugs. Home;  where does patient live--home, ALF, SNF? and with whom if at home? Yes;  Can patient participate in ADLs?  No Known Allergies  Family History  Problem Relation Age of Onset  . Lung disease Mother        pulm fibrosis  . Colitis Father   . Heart disease Brother   . Hypertension Brother   . Hyperlipidemia Brother   . CAD Daughter        cad  . Hypertension Son   . Colon cancer Neg Hx     (be sure to complete)  Prior to Admission medications   Medication Sig Start Date End Date Taking? Authorizing Provider  albuterol (PROVENTIL HFA;VENTOLIN HFA) 108 (90 Base) MCG/ACT inhaler Inhale 2 puffs into the lungs every 6 (six) hours as needed for wheezing or shortness of breath (Plan B). 12/08/15  Yes Lowne Chase, Kendrick Fries R, DO  ALPRAZolam (XANAX) 0.5 MG tablet TAKE 1 TABLET BY MOUTH THREE TIMES DAILY AS NEEDED FOR ANXIETY Patient taking differently: TAKE 0.5 MG BY MOUTH THREE TIMES DAILY AS NEEDED FOR ANXIETY 08/04/16  Yes Lowne  Lyndal Pulley R, DO  aspirin 81 MG tablet Take 81 mg by mouth every morning.    Yes [provider]  atorvastatin (LIPITOR) 40 MG tablet Take 1 tablet (40 mg total) by mouth daily. 12/08/15  Yes Roma Schanz R, DO  bisoprolol (ZEBETA) 5 MG tablet Take 0.5 tablets (2.5 mg total) by mouth daily. 06/16/16  Yes Jerline Pain, MD  BROVANA 15 MCG/2ML NEBU USE ONE VIAL IN NEBULIZER TWICE DAILY 07/17/16  Yes Tanda Rockers, MD  budesonide (PULMICORT) 0.5 MG/2ML nebulizer solution USE ONE VIAL (2ML) IN NEBULIZER TWICE DAILY 09/01/16  Yes Lowne Lyndal Pulley R, DO  citalopram (CELEXA) 20 MG tablet Take 1 tablet (20 mg total) by mouth daily. 07/14/16  Yes Roma Schanz R, DO  docusate sodium (COLACE) 100 MG capsule Take 100-200 mg by mouth See admin instructions. 1 capsule by mouth in the morning and 2 at bedtime    Yes [provider]  fenofibrate 160 MG tablet TAKE ONE TABLET BY MOUTH ONCE DAILY 07/24/16  Yes Roma Schanz R, DO  ferrous sulfate 325 (65 FE) MG EC tablet Take 1 tablet (325 mg total) by mouth 2 (two) times daily. 05/09/15  Yes Roma Schanz R, DO  furosemide (LASIX) 20 MG tablet Take 20 mg by mouth daily.   Yes [provider]  Glycopyrrolate (LONHALA MAGNAIR REFILL KIT) 25 MCG/ML SOLN Inhale 1 Dose into the lungs 2 (two) times daily.   Yes [provider]  ipratropium-albuterol (DUONEB) 0.5-2.5 (3) MG/3ML SOLN Take 3 mLs by nebulization every 4 (four) hours as needed (shortness of breath and wheezing plan C).   Yes [provider]  Multiple Vitamins-Minerals (CENTRUM SILVER PO) Take 1 tablet by mouth daily.    Yes [provider]  OXYGEN Use 2L at bedtime and as needed for shortness of breath   Yes [provider]  pantoprazole (PROTONIX) 40 MG tablet Take 1 tablet (40 mg total) by mouth daily. 12/06/15  Yes Roma Schanz R, DO  sodium chloride (OCEAN) 0.65 % SOLN nasal spray Place 1-2 sprays into both nostrils as needed for congestion.    Yes [provider]  temazepam (RESTORIL) 30 MG capsule TAKE ONE CAPSULE BY MOUTH AT BEDTIME AS NEEDED FOR SLEEP Patient taking differently: TAKE 30 MG BY MOUTH AT BEDTIME 06/22/16  Yes Roma Schanz R, DO  vitamin C (ASCORBIC ACID) 500 MG tablet Take 500 mg by mouth daily.   Yes [provider]  famotidine (PEPCID) 20 MG tablet Take 20 mg by mouth at bedtime.     [provider]  predniSONE (DELTASONE) 20 MG tablet 3 tabs po day one, then 2 po daily x 4 days 09/06/16   Julianne Rice, MD   Physical Exam: Vitals:   09/05/16 1430 09/05/16 1500  BP: 123/62 136/66  Pulse: (!) 110 (!) 109  Resp: 18   Temp:       General:  Alert. mild respiratory distress   Eyes: eom-I, perrla   ENT: no oral ulcers   Neck: supple, no JVD  Cardiovascular: s1,s2, mild tachycardia    Respiratory: BL wheezing, also poor ventilation   Abdomen: soft, nt, nd   Skin: no rash  Musculoskeletal: no leg edema   Psychiatric: no hallucinations,   Neurologic: cn 2-12 intact. Motor 5/5 BL symmetric   Labs on Admission:  Basic Metabolic Panel:  Recent Labs Lab 09/05/16 0940  NA 139  K 3.6  CL  105  CO2 24  GLUCOSE 120*  BUN 12  CREATININE 0.91  CALCIUM 9.2   Liver Function Tests: No results for input(s): AST, ALT, ALKPHOS, BILITOT, PROT, ALBUMIN in the last 168 hours. No results for input(s): LIPASE, AMYLASE in the last 168 hours. No results for input(s): AMMONIA in the last 168 hours. CBC:  Recent Labs Lab 09/05/16 0940  WBC 10.0  HGB 13.2  HCT 41.1  MCV 97.9  PLT 172   Cardiac Enzymes: No results for input(s): CKTOTAL, CKMB, CKMBINDEX, TROPONINI in the last 168 hours.  BNP (last 3 results)  Recent Labs  09/05/16 1053  BNP 302.8*    ProBNP (last 3 results) No results for input(s): PROBNP in the last 8760 hours.  CBG: No results for input(s): GLUCAP in the last 168 hours.  Radiological Exams on Admission: Dg Chest Port 1 View  Result Date: 09/05/2016 CLINICAL DATA:  Shortness of breath today. EXAM: PORTABLE CHEST 1 VIEW COMPARISON:  12/09/2015 and chest CT 07/23/2015 FINDINGS: Single view of the chest demonstrates patchy densities throughout both sides of the chest. Findings related to chronic changes and diffuse pleural calcifications. Streaky densities in the mid left lung are chronic. No large areas of new airspace disease. Heart size is within normal limits and stable. Atherosclerotic calcifications at the aortic arch. Patient had a transcatheter aortic valve replacement. Negative for a pneumothorax. IMPRESSION: Chronic changes throughout the chest.  No acute findings. Electronically Signed   By: Markus Daft M.D.   On: 09/05/2016 11:07    EKG: Independently reviewed.   Assessment/Plan Active Problems:   ANEMIA, SECONDARY TO BLOOD  LOSS   Acute on chronic respiratory failure (HCC)   Acute exacerbation of chronic obstructive pulmonary disease (COPD) ([place])   76 y.o. male with PMH of HTN, Aortic stenosis s/p TAVR, COPD on intermittent night time oxygen at home, presented with shortness of breath, cough and wheezing.  COPD exacerbation. bl wheezing, poor ventilation exam. Copd gold stage iii.  -start iv steroids, scheduled/prn bronchodilators, cont oxygen, will use NiPPV as needed.   Acute on chronic hypoxic respiratory failure/copd. Hypoxia: resolved with low flow oxygen. Cont as above.  Aortic stenosis s/p TAVR. Very mild pulmonary congestion on exam, no other s/s of acute HF. Will resume oral home diuresis.   None.  if consultant consulted, please document name and whether formally or informally consulted  Code Status: full (must indicate code status--if unknown or must be presumed, indicate so) Family Communication: d/w patient, ED (indicate person spoken with, if applicable, with phone number if by telephone) Disposition Plan: home when stable  (indicate anticipated LOS)  Time spent: >45 minutes   Kinnie Feil [facility] Pager [phone]  If 7PM-7AM, please contact night-coverage [email or web address] Password [email or web address] 09/05/2016, 3:24 PM

## 2016-09-06 DIAGNOSIS — J9621 Acute and chronic respiratory failure with hypoxia: Secondary | ICD-10-CM

## 2016-09-06 DIAGNOSIS — J441 Chronic obstructive pulmonary disease with (acute) exacerbation: Principal | ICD-10-CM

## 2016-09-06 MED ORDER — IPRATROPIUM-ALBUTEROL 0.5-2.5 (3) MG/3ML IN SOLN: 3.0000 mL | Freq: Four times a day (QID) | RESPIRATORY_TRACT | Status: DC

## 2016-09-06 MED ORDER — METHYLPREDNISOLONE SODIUM SUCC 125 MG IJ SOLR
60.0000 mg | Freq: Two times a day (BID) | INTRAMUSCULAR | Status: DC
  Administered 2016-09-06 – 2016-09-08 (×4): 60 mg via INTRAVENOUS
  Filled 2016-09-06 (×4): qty 2

## 2016-09-06 MED ORDER — IPRATROPIUM-ALBUTEROL 0.5-2.5 (3) MG/3ML IN SOLN
3.0000 mL | RESPIRATORY_TRACT | Status: DC | PRN
  Administered 2016-09-06 – 2016-09-08 (×4): 3 mL via RESPIRATORY_TRACT
  Filled 2016-09-06 (×3): qty 3

## 2016-09-06 MED ORDER — TEMAZEPAM 15 MG PO CAPS
15.0000 mg | ORAL_CAPSULE | Freq: Every evening | ORAL | Status: AC | PRN
  Administered 2016-09-06: 15 mg via ORAL
  Filled 2016-09-06: qty 1

## 2016-09-06 NOTE — Progress Notes (Signed)
Paged MD for sleep aid that pt takes at home but not together with xanax d/t sever sedation.

## 2016-09-06 NOTE — Progress Notes (Signed)
PROGRESS NOTE                                                                                                                                                                                                             Patient Demographics:    Ronald Bell, is a 76 y.o. male, DOB - 30-Dec-1940, NOM:[id number]  Admit date - 09/05/2016   Admitting Physician Kinnie Feil, MD  Outpatient Primary MD for the patient is Carollee Herter, Alferd Apa, DO  LOS - 1  Outpatient Specialists:Dr. [name]  Chief Complaint  Patient presents with  . Shortness of Breath       Brief Narrative   76 year old male with history of hypertension, aortic stenosis status post aVR, so on nighttime home oxygen presented with shortness of breath, cough and wheezing progressive for past several days. Denies chest pain or fever. Has quit smoking for past one and half years. In the ED he was hypoxic with O2 sat of 86% on exertion and activity wheezing. He was given IV Solu Medrol and nebulizer. Admitted to hospitalist service for acute COPD exacerbation.    Subjective:   Patient informs his breathing to have improved since admission but still not back to baseline. Feels he is 70% of his normal.   Assessment  & Plan :   Principal problem Acute on chronic respiratory failure with hypoxia Secondary to acute COPD exacerbation. Symptoms improving but still not back to baseline. (70% of his normal). Continue IV Solu Medrol, scheduled DuoNeb, antitussives and empiric antibiotic. Still requiring oxygen on and ablation. Will assess further need for continuous home O2 prior to discharge.  COPD gold III Plan as outlined above. Quit smoking one half years ago. Continue IV Solu-Medrol, scheduled DuoNeb's, twice a day Brovana nebs and daily Pulmicort neb.  CAD/PVD/ history of TaVR Continue aspirin, statin and bisoprolol.  Anxiety and depression  continue Xanax  and Celexa.  Dyslipidemia Continue statin and fibrillated.  Iron Deficiency anemia Continue iron supplement  GERD Continue PPI and Pepcid.  Code Status : Full code  Family Communication  : None at bedside  Disposition Plan  : Home possibly in a.m. if continues to improve  Barriers For Discharge : Active symptoms  Consults  :  None  Procedures  : None  DVT Prophylaxis  :  Lovenox -   Lab  Results  Component Value Date   PLT 169 09/05/2016    Antibiotics  :    Anti-infectives    None        Objective:   Vitals:   09/05/16 2017 09/06/16 0031 09/06/16 0545 09/06/16 1251  BP: (!) 136/52 132/65 (!) 162/65 (!) 147/65  Pulse: 90 80 80 77  Resp: 20 18 18 20   Temp: 98.2 F (36.8 C) 98.4 F (36.9 C) 98.2 F (36.8 C) 98.1 F (36.7 C)  TempSrc: Oral Oral Oral Oral  SpO2: 96% 95% 99% 95%  Weight:   70.9 kg (156 lb 3.2 oz)   Height:        Wt Readings from Last 3 Encounters:  09/06/16 70.9 kg (156 lb 3.2 oz)  07/21/16 70.1 kg (154 lb 9.6 oz)  07/14/16 69.9 kg (154 lb)     Intake/Output Summary (Last 24 hours) at 09/06/16 1354 Last data filed at 09/06/16 1050  Gross per 24 hour  Intake             1320 ml  Output              750 ml  Net              570 ml     Physical Exam  Gen: not in distress HEENT:  moist mucosa, supple neck Chest: Diminished bilateral breath sounds, no rhonchi, wheeze or crackles CVS: N S1&S2, no murmurs, rubs or gallop GI: soft, NT, ND,  Musculoskeletal: warm, trace edema     Data Review:    CBC  Recent Labs Lab 09/05/16 0940 09/05/16 1616  WBC 10.0 13.6*  HGB 13.2 12.4*  HCT 41.1 37.7*  PLT 172 169  MCV 97.9 97.4  MCH 31.4 32.0  MCHC 32.1 32.9  RDW 14.3 14.1    Chemistries   Recent Labs Lab 09/05/16 0940 09/05/16 1616  NA 139  --   K 3.6  --   CL 105  --   CO2 24  --   GLUCOSE 120*  --   BUN 12  --   CREATININE 0.91 1.08  CALCIUM 9.2  --     ------------------------------------------------------------------------------------------------------------------ No results for input(s): CHOL, HDL, LDLCALC, TRIG, CHOLHDL, LDLDIRECT in the last 72 hours.  Lab Results  Component Value Date   HGBA1C 6.1 (H) 11/16/2014   ------------------------------------------------------------------------------------------------------------------ No results for input(s): TSH, T4TOTAL, T3FREE, THYROIDAB in the last 72 hours.  Invalid input(s): FREET3 ------------------------------------------------------------------------------------------------------------------ No results for input(s): VITAMINB12, FOLATE, FERRITIN, TIBC, IRON, RETICCTPCT in the last 72 hours.  Coagulation profile No results for input(s): INR, PROTIME in the last 168 hours.  No results for input(s): DDIMER in the last 72 hours.  Cardiac Enzymes No results for input(s): CKMB, TROPONINI, MYOGLOBIN in the last 168 hours.  Invalid input(s): CK ------------------------------------------------------------------------------------------------------------------    Component Value Date/Time   BNP 302.8 (H) 09/05/2016 1053    Inpatient Medications  Scheduled Meds: . arformoterol  15 mcg Nebulization BID  . aspirin EC  81 mg Oral BH-q7a  . atorvastatin  40 mg Oral q1800  . bisoprolol  2.5 mg Oral Daily  . budesonide  0.5 mg Nebulization Daily  . citalopram  20 mg Oral Daily  . docusate sodium  100 mg Oral Daily  . docusate sodium  200 mg Oral QHS  . enoxaparin (LOVENOX) injection  40 mg Subcutaneous Q24H  . famotidine  20 mg Oral QHS  . fenofibrate  160 mg Oral Daily  . ferrous sulfate  325 mg Oral BID WC  . furosemide  20 mg Oral Daily  . ipratropium-albuterol  3 mL Nebulization BID  . methylPREDNISolone (SOLU-MEDROL) injection  40 mg Intravenous Q6H  . pantoprazole  40 mg Oral Daily  . sodium chloride flush  3 mL Intravenous Q12H  . vitamin C  500 mg Oral Daily    Continuous Infusions: PRN Meds:.acetaminophen **OR** acetaminophen, ALPRAZolam, ipratropium-albuterol  Micro Results No results found for this or any previous visit (from the past 240 hour(s)).  Radiology Reports Dg Chest Port 1 View  Result Date: 09/05/2016 CLINICAL DATA:  Shortness of breath today. EXAM: PORTABLE CHEST 1 VIEW COMPARISON:  12/09/2015 and chest CT 07/23/2015 FINDINGS: Single view of the chest demonstrates patchy densities throughout both sides of the chest. Findings related to chronic changes and diffuse pleural calcifications. Streaky densities in the mid left lung are chronic. No large areas of new airspace disease. Heart size is within normal limits and stable. Atherosclerotic calcifications at the aortic arch. Patient had a transcatheter aortic valve replacement. Negative for a pneumothorax. IMPRESSION: Chronic changes throughout the chest.  No acute findings. Electronically Signed   By: Markus Daft M.D.   On: 09/05/2016 11:07    Time Spent in minutes  25   Louellen Molder M.D on 09/06/2016 at 1:54 PM  Between 7am to 7pm - Pager - [phone]  After 7pm go to [email or web address] - password [facility]  [facility] -  Office  [phone]

## 2016-09-07 MED ORDER — TEMAZEPAM 15 MG PO CAPS
30.0000 mg | ORAL_CAPSULE | Freq: Once | ORAL | Status: AC
  Administered 2016-09-07: 30 mg via ORAL
  Filled 2016-09-07: qty 2

## 2016-09-07 MED ORDER — ORAL CARE MOUTH RINSE
15.0000 mL | Freq: Two times a day (BID) | OROMUCOSAL | Status: DC
  Administered 2016-09-07 – 2016-09-08 (×2): 15 mL via OROMUCOSAL

## 2016-09-07 NOTE — Progress Notes (Signed)
PROGRESS NOTE                                                                                                                                                                                                             Patient Demographics:    Ronald Bell, is a 76 y.o. male, DOB - 1940-12-05, IDP:[id number]  Admit date - 09/05/2016   Admitting Physician Kinnie Feil, MD  Outpatient Primary MD for the patient is Carollee Herter, Alferd Apa, DO  LOS - 2  Outpatient Specialists:Dr. [name]  Chief Complaint  Patient presents with  . Shortness of Breath       Brief Narrative   76 year old male with history of hypertension, aortic stenosis status post aVR, so on nighttime home oxygen presented with shortness of breath, cough and wheezing progressive for past several days. Denies chest pain or fever. Has quit smoking for past one and half years. In the ED he was hypoxic with O2 sat of 86% on exertion and activity wheezing. He was given IV Solu Medrol and nebulizer. Admitted to hospitalist service for acute COPD exacerbation.    Subjective:   Patient informs that he does not feel any better today and continues to feel short of breath.   Assessment  & Plan :   Principal problem Acute on chronic respiratory failure with hypoxia Secondary to acute COPD exacerbation. Symptoms unimproved today.. Continue IV Solu Medrol, scheduled DuoNeb, antitussives and empiric antibiotic. Recommend to use oxygen continuously. Assess need for continuous home O2 prior to discharge.  COPD gold III Plan as outlined above. Quit smoking one half years ago. Continue IV Solu-Medrol, scheduled DuoNeb's, twice a day Brovana nebs and daily Pulmicort neb.  CAD/PVD/ history of TaVR Continue aspirin, statin and bisoprolol.  Anxiety and depression  continue Xanax and Celexa.  Dyslipidemia Continue statin and fibrate  Iron Deficiency  anemia Continue iron supplement  GERD Continue PPI and Pepcid.  Code Status : Full code  Family Communication  : None at bedside  Disposition Plan  : Still symptomatic. Home tomorrow if improved  Barriers For Discharge : Active symptoms  Consults  :  None  Procedures  : None  DVT Prophylaxis  :  Lovenox -   Lab Results  Component Value Date   PLT 169 09/05/2016    Antibiotics  :  Anti-infectives    None        Objective:   Vitals:   09/07/16 0536 09/07/16 0858 09/07/16 0859 09/07/16 0900  BP: (!) 169/87     Pulse: 88     Resp: 20     Temp: 97.7 F (36.5 C)     TempSrc: Oral     SpO2: 99% 97% 97% 97%  Weight: 70.5 kg (155 lb 8 oz)     Height:        Wt Readings from Last 3 Encounters:  09/07/16 70.5 kg (155 lb 8 oz)  07/21/16 70.1 kg (154 lb 9.6 oz)  07/14/16 69.9 kg (154 lb)     Intake/Output Summary (Last 24 hours) at 09/07/16 1144 Last data filed at 09/07/16 0829  Gross per 24 hour  Intake             1200 ml  Output              300 ml  Net              900 ml     Physical Exam Gen.: Elderly male appears fatigued HEENT: Moist mucosa, supple neck Chest: Clear breath sounds bilaterally, no added sounds CVS: Normal S1 and S2, no murmurs GI: Soft, nondistended, nontender Musculoskeletal: Trace edema     Data Review:    CBC  Recent Labs Lab 09/05/16 0940 09/05/16 1616  WBC 10.0 13.6*  HGB 13.2 12.4*  HCT 41.1 37.7*  PLT 172 169  MCV 97.9 97.4  MCH 31.4 32.0  MCHC 32.1 32.9  RDW 14.3 14.1    Chemistries   Recent Labs Lab 09/05/16 0940 09/05/16 1616  NA 139  --   K 3.6  --   CL 105  --   CO2 24  --   GLUCOSE 120*  --   BUN 12  --   CREATININE 0.91 1.08  CALCIUM 9.2  --    ------------------------------------------------------------------------------------------------------------------ No results for input(s): CHOL, HDL, LDLCALC, TRIG, CHOLHDL, LDLDIRECT in the last 72 hours.  Lab Results  Component Value Date    HGBA1C 6.1 (H) 11/16/2014   ------------------------------------------------------------------------------------------------------------------ No results for input(s): TSH, T4TOTAL, T3FREE, THYROIDAB in the last 72 hours.  Invalid input(s): FREET3 ------------------------------------------------------------------------------------------------------------------ No results for input(s): VITAMINB12, FOLATE, FERRITIN, TIBC, IRON, RETICCTPCT in the last 72 hours.  Coagulation profile No results for input(s): INR, PROTIME in the last 168 hours.  No results for input(s): DDIMER in the last 72 hours.  Cardiac Enzymes No results for input(s): CKMB, TROPONINI, MYOGLOBIN in the last 168 hours.  Invalid input(s): CK ------------------------------------------------------------------------------------------------------------------    Component Value Date/Time   BNP 302.8 (H) 09/05/2016 1053    Inpatient Medications  Scheduled Meds: . arformoterol  15 mcg Nebulization BID  . aspirin EC  81 mg Oral BH-q7a  . atorvastatin  40 mg Oral q1800  . bisoprolol  2.5 mg Oral Daily  . budesonide  0.5 mg Nebulization Daily  . citalopram  20 mg Oral Daily  . docusate sodium  100 mg Oral Daily  . docusate sodium  200 mg Oral QHS  . enoxaparin (LOVENOX) injection  40 mg Subcutaneous Q24H  . famotidine  20 mg Oral QHS  . fenofibrate  160 mg Oral Daily  . ferrous sulfate  325 mg Oral BID WC  . furosemide  20 mg Oral Daily  . methylPREDNISolone (SOLU-MEDROL) injection  60 mg Intravenous Q12H  . pantoprazole  40 mg Oral Daily  . sodium chloride  flush  3 mL Intravenous Q12H  . vitamin C  500 mg Oral Daily   Continuous Infusions: PRN Meds:.acetaminophen **OR** acetaminophen, ALPRAZolam, ipratropium-albuterol  Micro Results No results found for this or any previous visit (from the past 240 hour(s)).  Radiology Reports Dg Chest Port 1 View  Result Date: 09/05/2016 CLINICAL DATA:  Shortness of  breath today. EXAM: PORTABLE CHEST 1 VIEW COMPARISON:  12/09/2015 and chest CT 07/23/2015 FINDINGS: Single view of the chest demonstrates patchy densities throughout both sides of the chest. Findings related to chronic changes and diffuse pleural calcifications. Streaky densities in the mid left lung are chronic. No large areas of new airspace disease. Heart size is within normal limits and stable. Atherosclerotic calcifications at the aortic arch. Patient had a transcatheter aortic valve replacement. Negative for a pneumothorax. IMPRESSION: Chronic changes throughout the chest.  No acute findings. Electronically Signed   By: Markus Daft M.D.   On: 09/05/2016 11:07    Time Spent in minutes  25   Louellen Molder M.D on 09/07/2016 at 11:44 AM  Between 7am to 7pm - Pager - [phone]  After 7pm go to [email or web address] - password [facility]  [facility] -  Office  [phone]

## 2016-09-07 NOTE — Consult Note (Signed)
   [facility] Inpatient Consult   09/07/2016  Ronald Bell 1940/06/11 [id number]   Patient was reviewed for the Medicare ACO.  Patient has been active with [place] based Palliative care program and confirmed with Margie at [place]. Admitted with COPD exacerbation.  No current [facility] Care Management needs as he is enrolled in their program.  For questions, please contact:  Natividad Brood, RN BSN [facility] Liaison  [phone] business mobile phone Toll free office [phone]

## 2016-09-08 DIAGNOSIS — E785 Hyperlipidemia, unspecified: Secondary | ICD-10-CM

## 2016-09-08 DIAGNOSIS — D508 Other iron deficiency anemias: Secondary | ICD-10-CM

## 2016-09-08 MED ORDER — FERROUS SULFATE 325 (65 FE) MG PO TABS
325.0000 mg | ORAL_TABLET | Freq: Two times a day (BID) | ORAL | Status: DC
  Administered 2016-09-08: 325 mg via ORAL
  Filled 2016-09-08: qty 1

## 2016-09-08 MED ORDER — PREDNISONE 20 MG PO TABS: 20.0000 mg | ORAL_TABLET | Freq: Every day | ORAL | 0 refills | Status: DC

## 2016-09-08 MED ORDER — GUAIFENESIN ER 600 MG PO TB12: 600.0000 mg | ORAL_TABLET | Freq: Two times a day (BID) | ORAL | 0 refills | Status: DC

## 2016-09-08 NOTE — Discharge Summary (Signed)
Physician Discharge Summary  Ronald Bell TGP:[id number] DOB: 1941/02/06 DOA: 09/05/2016  PCP: Ann Held, DO  Admit date: 09/05/2016 Discharge date: 09/08/2016  Admitted From: Home Disposition:  Home  Recommendations for Outpatient Follow-up:  1. Follow up with PCP in 1-2 weeks 2. Follow-up with pulmonologist( Dr [name])  in 4 weeks  Home Health:None Equipment/Devices: Oxygen (2 L at night)  Discharge Condition:Stable CODE STATUS:Full code Diet recommendation: Regular     Discharge Diagnoses:  Active Problems:   Acute exacerbation of chronic obstructive pulmonary disease (COPD) (HCC)   Active problems   Acute on chronic respiratory failure (HCC)   Anxiety   Iron  deficiency anemia   Peripheral vascular disease Coronary artery disease  Brief narrative/history of present illness 76 year old male with history of hypertension, aortic stenosis status post aVR, so on nighttime home oxygen presented with shortness of breath, cough and wheezing progressive for past several days. Denies chest pain or fever. Has quit smoking for past one and half years. In the ED he was hypoxic with O2 sat of 86% on exertion and activity wheezing. He was given IV Solu Medrol and nebulizer. Admitted to hospitalist service for acute COPD exacerbation.  Hospital course Principal problem Acute on chronic respiratory failure with hypoxia Secondary to acute COPD exacerbation. Placed on IV Solu-Medrol, scheduled DuoNeb, and antitussives. Symptoms better. We will discharge him on oral prednisone taper over the next 12 days. Continue home inhalers and nebulizer. Does not need oxygen at rest or ambulation. Continue oxygen at night.   COPD gold III Plan as outlined above. Quit smoking one half years ago. However is exposed to second hand smoking from his wife. I have instructed his wife to quit smoking herself and not to smoke at home and keep patient away from smoke exposure. Follow-up  with his pulmonologist in 4 weeks.  CAD/PVD/ history of TaVR Continue aspirin, statin and bisoprolol.  Anxiety and depression  continue Xanax and Celexa.  Dyslipidemia Continue statin and fibrate  Iron Deficiency anemia Continue iron supplement  GERD Continue PPI and Pepcid.    Family Communication  : None at bedside  Disposition Plan  : Stable to be discharged home Consults  :  None  Procedures  : None   Discharge Instructions   Allergies as of 09/08/2016   No Known Allergies     Medication List    TAKE these medications   albuterol 108 (90 Base) MCG/ACT inhaler Commonly known as:  PROVENTIL HFA;VENTOLIN HFA Inhale 2 puffs into the lungs every 6 (six) hours as needed for wheezing or shortness of breath (Plan B).   ALPRAZolam 0.5 MG tablet Commonly known as:  XANAX TAKE 1 TABLET BY MOUTH THREE TIMES DAILY AS NEEDED FOR ANXIETY What changed:  See the new instructions.   aspirin 81 MG tablet Take 81 mg by mouth every morning.   atorvastatin 40 MG tablet Commonly known as:  LIPITOR Take 1 tablet (40 mg total) by mouth daily.   bisoprolol 5 MG tablet Commonly known as:  ZEBETA Take 0.5 tablets (2.5 mg total) by mouth daily.   BROVANA 15 MCG/2ML Nebu Generic drug:  arformoterol USE ONE VIAL IN NEBULIZER TWICE DAILY   budesonide 0.5 MG/2ML nebulizer solution Commonly known as:  PULMICORT USE ONE VIAL (2ML) IN NEBULIZER TWICE DAILY   CENTRUM SILVER PO Take 1 tablet by mouth daily.   citalopram 20 MG tablet Commonly known as:  CELEXA Take 1 tablet (20 mg total) by mouth daily.  docusate sodium 100 MG capsule Commonly known as:  COLACE Take 100-200 mg by mouth See admin instructions. 1 capsule by mouth in the morning and 2 at bedtime   famotidine 20 MG tablet Commonly known as:  PEPCID Take 20 mg by mouth at bedtime.   fenofibrate 160 MG tablet TAKE ONE TABLET BY MOUTH ONCE DAILY   ferrous sulfate 325 (65 FE) MG EC tablet Take 1  tablet (325 mg total) by mouth 2 (two) times daily.   furosemide 20 MG tablet Commonly known as:  LASIX Take 20 mg by mouth daily.   guaiFENesin 600 MG 12 hr tablet Commonly known as:  MUCINEX Take 1 tablet (600 mg total) by mouth 2 (two) times daily.   ipratropium-albuterol 0.5-2.5 (3) MG/3ML Soln Commonly known as:  DUONEB Take 3 mLs by nebulization every 4 (four) hours as needed (shortness of breath and wheezing plan C).   LONHALA MAGNAIR REFILL KIT 25 MCG/ML Soln Generic drug:  Glycopyrrolate Inhale 1 Dose into the lungs 2 (two) times daily.   OXYGEN Use 2L at bedtime and as needed for shortness of breath   pantoprazole 40 MG tablet Commonly known as:  PROTONIX Take 1 tablet (40 mg total) by mouth daily.       predniSONE 20 MG tablet Commonly known as:  DELTASONE Take 1 tablet (20 mg total) by mouth daily with breakfast. ( see taper instructions, stop date 6/30) What changed:  You were already taking a medication with the same name, and this prescription was added. Make sure you understand how and when to take each.   sodium chloride 0.65 % Soln nasal spray Commonly known as:  OCEAN Place 1-2 sprays into both nostrils as needed for congestion.   temazepam 30 MG capsule Commonly known as:  RESTORIL TAKE ONE CAPSULE BY MOUTH AT BEDTIME AS NEEDED FOR SLEEP What changed:  See the new instructions.   vitamin C 500 MG tablet Commonly known as:  ASCORBIC ACID Take 500 mg by mouth daily.      Follow-up Information    Schedule an appointment as soon as possible for a visit with Carollee Herter, Alferd Apa, DO.   Specialty:  Family Medicine Contact information: [address] [place] [address] [phone]        [facility] EMERGENCY DEPARTMENT Follow up.   Specialty:  Emergency Medicine Why:  As needed, If symptoms worsen Contact information: [address] [id number] [place] [id number]          No Known Allergies      Procedures/Studies: Dg Chest Port 1 View  Result Date: 09/05/2016 CLINICAL DATA:  Shortness of breath today. EXAM: PORTABLE CHEST 1 VIEW COMPARISON:  12/09/2015 and chest CT 07/23/2015 FINDINGS: Single view of the chest demonstrates patchy densities throughout both sides of the chest. Findings related to chronic changes and diffuse pleural calcifications. Streaky densities in the mid left lung are chronic. No large areas of new airspace disease. Heart size is within normal limits and stable. Atherosclerotic calcifications at the aortic arch. Patient had a transcatheter aortic valve replacement. Negative for a pneumothorax. IMPRESSION: Chronic changes throughout the chest.  No acute findings. Electronically Signed   By: Markus Daft M.D.   On: 09/05/2016 11:07       Subjective: Breathing much improved and feels at baseline.  Discharge Exam: Vitals:   09/08/16 0554 09/08/16 0746  BP: 131/77 (!) 150/64  Pulse: 73 (!) 56  Resp:  20  Temp: 97.9 F (36.6 C) 97.8 F (36.6 C)   Vitals:   09/08/16 0746 09/08/16 0813 09/08/16 0815 09/08/16 0819  BP: (!) 150/64     Pulse: (!) 56     Resp: 20     Temp: 97.8 F (36.6 C)     TempSrc: Oral     SpO2: 98% 98% 98% 98%  Weight:      Height:        Gen.: Elderly male not in distress HEENT: Moist mucosa, supple neck Chest: Clear breath sounds bilaterally, no added sounds CVS: Normal S1 and S2, no murmurs GI: Soft, nondistended, nontender Musculoskeletal: Trace edema    The results of significant diagnostics from this hospitalization (including imaging, microbiology, ancillary and laboratory) are listed below for reference.     Microbiology: No results found for this or any previous visit (from the past 240 hour(s)).   Labs: BNP (last 3 results)  Recent Labs  09/05/16 1053  BNP 644.0*   Basic Metabolic Panel:  Recent Labs Lab 09/05/16 0940 09/05/16 1616  NA 139  --   K 3.6  --   CL  105  --   CO2 24  --   GLUCOSE 120*  --   BUN 12  --   CREATININE 0.91 1.08  CALCIUM 9.2  --    Liver Function Tests: No results for input(s): AST, ALT, ALKPHOS, BILITOT, PROT, ALBUMIN in the last 168 hours. No results for input(s): LIPASE, AMYLASE in the last 168 hours. No results for input(s): AMMONIA in the last 168 hours. CBC:  Recent Labs Lab 09/05/16 0940 09/05/16 1616  WBC 10.0 13.6*  HGB 13.2 12.4*  HCT 41.1 37.7*  MCV 97.9 97.4  PLT 172 169   Cardiac Enzymes: No results for input(s): CKTOTAL, CKMB, CKMBINDEX, TROPONINI in the last 168 hours. BNP: Invalid input(s): POCBNP CBG: No results for input(s): GLUCAP in the last 168 hours. D-Dimer No results for input(s): DDIMER in the last 72 hours. Hgb A1c No results for input(s): HGBA1C in the last 72 hours. Lipid Profile No results for input(s): CHOL, HDL, LDLCALC, TRIG, CHOLHDL, LDLDIRECT in the last 72 hours. Thyroid function studies No results for input(s): TSH, T4TOTAL, T3FREE, THYROIDAB in the last 72 hours.  Invalid input(s): FREET3 Anemia work up No results for input(s): VITAMINB12, FOLATE, FERRITIN, TIBC, IRON, RETICCTPCT in the last 72 hours. Urinalysis    Component Value Date/Time   COLORURINE YELLOW 11/16/2014 1058   APPEARANCEUR CLEAR 11/16/2014 1058   LABSPEC 1.015 11/16/2014 1058   PHURINE 6.5 11/16/2014 1058   GLUCOSEU NEGATIVE 11/16/2014 1058   HGBUR NEGATIVE 11/16/2014 1058   HGBUR negative 11/30/2008 0000   BILIRUBINUR neg 05/09/2015 1106   KETONESUR NEGATIVE 11/16/2014 1058   PROTEINUR neg 05/09/2015 1106   PROTEINUR NEGATIVE 11/16/2014 1058   UROBILINOGEN 0.2 05/09/2015 1106   UROBILINOGEN 0.2 11/16/2014 1058   NITRITE neg 05/09/2015 1106   NITRITE NEGATIVE 11/16/2014 1058   LEUKOCYTESUR Negative 05/09/2015 1106   Sepsis Labs Invalid input(s): PROCALCITONIN,  WBC,  LACTICIDVEN Microbiology No results found for this or any previous visit (from the past 240 hour(s)).   Time  coordinating discharge: Over 30 minutes  SIGNED:   Louellen Molder, MD  [facility] 09/08/2016, 11:38 AM Pager   If 7PM-7AM, please contact night-coverage [email or web address] Password [email or web address]

## 2016-09-08 NOTE — Progress Notes (Signed)
Pt has orders to be discharged. Discharge instructions given and pt has no additional questions at this time. Medication regimen reviewed and pt educated. Pt verbalized understanding and has no additional questions. Telemetry box removed. IV removed and site in good condition. Pt stable and waiting for transportation. 

## 2016-09-08 NOTE — Consult Note (Signed)
Care connection with [facility]. Pt is active with [place]. Continue to educate pt on utilizing resources before coming to hospital. He is feeling better and is ready to go back home. Webb Silversmith RN

## 2016-09-08 NOTE — Discharge Instructions (Signed)
Chronic Obstructive Pulmonary Disease Exacerbation  Chronic obstructive pulmonary disease (COPD) is a common lung problem. In COPD, the flow of air from the lungs is limited. COPD exacerbations are times that breathing gets worse and you need extra treatment. Without treatment they can be life threatening. If they happen often, your lungs can become more damaged. If your COPD gets worse, your doctor may treat you with:  ? Medicines.  ? Oxygen.  ? Different ways to clear your airway, such as using a mask.    Follow these instructions at home:  ? Do not smoke.  ? Avoid tobacco smoke and other things that bother your lungs.  ? If given, take your antibiotic medicine as told. Finish the medicine even if you start to feel better.  ? Only take medicines as told by your doctor.  ? Drink enough fluids to keep your pee (urine) clear or pale yellow (unless your doctor has told you not to).  ? Use a cool mist machine (vaporizer).  ? If you use oxygen or a machine that turns liquid medicine into a mist (nebulizer), continue to use them as told.  ? Keep up with shots (vaccinations) as told by your doctor.  ? Exercise regularly.  ? Eat healthy foods.  ? Keep all doctor visits as told.  Get help right away if:  ? You are very short of breath and it gets worse.  ? You have trouble talking.  ? You have bad chest pain.  ? You have blood in your spit (sputum).  ? You have a fever.  ? You keep throwing up (vomiting).  ? You feel weak, or you pass out (faint).  ? You feel confused.  ? You keep getting worse.  This information is not intended to replace advice given to you by your health care provider. Make sure you discuss any questions you have with your health care provider.  Document Released: 02/26/2011 Document Revised: 08/15/2015 Document Reviewed: 11/11/2012  Elsevier Interactive Patient Education ? 2017 Elsevier Inc.

## 2016-09-09 ENCOUNTER — Telehealth: Payer: Self-pay | Admitting: Behavioral Health

## 2016-09-09 NOTE — Telephone Encounter (Signed)
Transition Care Management Follow-up Telephone Call  PCP: Ann Held, DO  Admit date: 09/05/2016 Discharge date: 09/08/2016  Admitted From: Home Disposition:  Home  Recommendations for Outpatient Follow-up:  1. Follow up with PCP in 1-2 weeks 2. Follow-up with pulmonologist( Dr [name])  in 4 weeks  Home Health:None Equipment/Devices: Oxygen (2 L at night)  Discharge Condition:Stable   How have you been since you were released from the hospital? Per the patient's wife, "he's ok, just finishing his nebulizer treatment".   Do you understand why you were in the hospital? yes   Do you understand the discharge instructions? yes   Where were you discharged to? Home with wife.   Items Reviewed:  Medications reviewed: yes  Allergies reviewed: yes, NKA  Dietary changes reviewed: yes, regular diet 6. Referrals reviewed: yes, Follow up with PCP in 1-2 weeks; Follow-up with pulmonologist( Dr [name]) in 4 weeks.   Functional Questionnaire:   Activities of Daily Living (ADLs):   He states they are independent in the following: ambulation, feeding, continence and toileting States they require assistance with the following: bathing and hygiene, grooming and dressing   Any transportation issues/concerns?: no   Any patient concerns? no   Confirmed importance and date/time of follow-up visits scheduled yes, 09/11/16 at 11:30 AM.  Provider Appointment booked with Dr. Carollee Herter.  Confirmed with patient if condition begins to worsen call PCP or go to the ER.  Patient was given the office number and encouraged to call back with question or concerns.  : yes

## 2016-09-11 ENCOUNTER — Telehealth: Payer: Self-pay | Admitting: Internal Medicine

## 2016-09-11 ENCOUNTER — Inpatient Hospital Stay: Payer: Medicare Other | Admitting: Family Medicine

## 2016-09-11 NOTE — Telephone Encounter (Signed)
Spoke with pt, states he was recently hospitalized d/t copd exacerbation- pt fell asleep without wearing his O2, woke up in copd exacerbation.  Pt was told to follow up with pulmonary in 4 weeks.  Pt has appt on 8/1, would like to keep this appt since he states he has no current complaints.  I advised pt that we would keep this appt, but he needs to call our office if his s/s worsen.  Pt expressed understanding.  Nothing further needed.

## 2016-09-27 ENCOUNTER — Other Ambulatory Visit: Payer: Self-pay | Admitting: Family Medicine

## 2016-09-27 DIAGNOSIS — F411 Generalized anxiety disorder: Secondary | ICD-10-CM

## 2016-09-28 ENCOUNTER — Other Ambulatory Visit: Payer: Self-pay | Admitting: Family Medicine

## 2016-09-28 DIAGNOSIS — F411 Generalized anxiety disorder: Secondary | ICD-10-CM

## 2016-09-28 NOTE — Telephone Encounter (Signed)
Faxed hardcopy for alprazolam to [facility] [address] [facility]

## 2016-09-28 NOTE — Telephone Encounter (Signed)
Requesting:   alprazolam Contract    02/12/2014 UDS   none Last OV     07/14/2016 Last Refill   #90 with 1 refills on 08/04/2016  Please Advise

## 2016-10-06 ENCOUNTER — Telehealth: Payer: Self-pay | Admitting: Internal Medicine

## 2016-10-06 NOTE — Telephone Encounter (Signed)
Spoke with rep, states that from here on out pt will be receiving Lonhala through [facility].  This rx has already been transferred there.  Nothing further needed.

## 2016-10-06 NOTE — Telephone Encounter (Signed)
Spoke with rep, states that Ronald Bell is requiring a PA.  [facility] is refaxing this request to our office, verified fax #.  Will await fax.

## 2016-10-07 NOTE — Telephone Encounter (Signed)
Checked PA folder and MW's look at. This PA has not been received. Called [facility] and they state that they never received a prescription. [employer] and spoke with [place]. She states that they have no way to transfer prescriptions, this is why the prescription was not at [facility]. Miranda also states that the preferred pharmacy on the pt's plan is [facility] not [facility]. Rx will also be cheaper through Mail Order with a copay of 48% of the full price.  lmtcb x1 for pt to see where he would like [place] to send his prescription.

## 2016-10-08 MED ORDER — GLYCOPYRROLATE 25 MCG/ML IN SOLN: 1.0000 | Freq: Two times a day (BID) | RESPIRATORY_TRACT | 2 refills | Status: DC

## 2016-10-08 NOTE — Telephone Encounter (Signed)
Called and spoke with pt and he is aware of issues with his insurance.  He stated that he would rather stay with [facility].  New rx has been sent there.  Pt stated that he will not need this until another month or so.

## 2016-10-13 ENCOUNTER — Telehealth: Payer: Self-pay | Admitting: Internal Medicine

## 2016-10-13 MED ORDER — GLYCOPYRROLATE 25 MCG/ML IN SOLN: 1.0000 | Freq: Two times a day (BID) | RESPIRATORY_TRACT | 3 refills | Status: DC

## 2016-10-13 NOTE — Telephone Encounter (Signed)
Called and spoke to pt. Pt questioning the status of the Lonhala neb. Advised pt we are waiting on the PA. Called [facility] at [phone] and spoke to a rep that states because of pt's insurance we will have to run his PA through [facility] (ph: [phone]). Spoke with a rep, Lyndee Leo, who states the medication will need two PA's, both through part B and part D. Both forms are to be faxed to the front fax, they are to be completed and faxed back. Spoke with pt and he is aware and requests [place] to call back with updates. Will await form.   Pt's ID: [id number] Ref # for call: [id number]

## 2016-10-14 NOTE — Telephone Encounter (Signed)
Matt called from [employer] and states that PA needs to be on [facility] form - which he started in their system - it can be accessed by using Key- ULEWWK- he can be reached at [phone] -pr

## 2016-10-14 NOTE — Telephone Encounter (Signed)
I have the form on my desk  Dr [name] are you wanting to proceed with the PA? Note that he has f/u pending for 8/1 and was last seen by TP for med cal 5/1  Thanks!

## 2016-10-14 NOTE — Telephone Encounter (Signed)
Per phone note, Magda Paganini already has the form and is waiting on MW's recommendation on whether to proceed with PA

## 2016-10-16 MED ORDER — GLYCOPYRROLATE 25 MCG/ML IN SOLN: 1.0000 | Freq: Two times a day (BID) | RESPIRATORY_TRACT | 11 refills | Status: DC

## 2016-10-16 NOTE — Telephone Encounter (Signed)
Spoke with Pilar Plate, the drug rep for this med  He advised that we send the rx for this med to [facility], b/c they will handle the PA  I have sent med refill to [facility]  ATC and inform the pt and NA and no option to leave VM, WCB later

## 2016-10-16 NOTE — Telephone Encounter (Signed)
lmtcb for Ronald Bell at [employer].

## 2016-10-16 NOTE — Telephone Encounter (Signed)
Pharm returning call and can be reached @ [phone].Hillery Hunter

## 2016-10-16 NOTE — Telephone Encounter (Signed)
Joycelyn Schmid called from [employer] - requesting a call back - She can be reached at [phone] -pr

## 2016-10-16 NOTE — Telephone Encounter (Signed)
Spoke with margaret at [facility], states that they will fill pt's rx with a 30-day free voucher, will initiate PA for lonhala.  Pharmacy will reach out to pt to schedule delivery.   lmtcb X1 for pt to make aware of above.

## 2016-10-19 NOTE — Telephone Encounter (Signed)
LMOM TCB x2

## 2016-10-20 NOTE — Telephone Encounter (Signed)
lmtcb X3 for pt. 

## 2016-10-21 ENCOUNTER — Telehealth: Payer: Self-pay | Admitting: Internal Medicine

## 2016-10-21 ENCOUNTER — Encounter: Payer: Self-pay | Admitting: Internal Medicine

## 2016-10-21 ENCOUNTER — Ambulatory Visit (INDEPENDENT_AMBULATORY_CARE_PROVIDER_SITE_OTHER): Payer: Medicare Other | Admitting: Internal Medicine

## 2016-10-21 VITALS — BP 120/82 | HR 70 | Ht 62.5 in | Wt 152.0 lb

## 2016-10-21 DIAGNOSIS — I2583 Coronary atherosclerosis due to lipid rich plaque: Secondary | ICD-10-CM

## 2016-10-21 DIAGNOSIS — J9612 Chronic respiratory failure with hypercapnia: Secondary | ICD-10-CM | POA: Diagnosis not present

## 2016-10-21 DIAGNOSIS — J449 Chronic obstructive pulmonary disease, unspecified: Secondary | ICD-10-CM

## 2016-10-21 DIAGNOSIS — I2510 Atherosclerotic heart disease of native coronary artery without angina pectoris: Secondary | ICD-10-CM | POA: Diagnosis not present

## 2016-10-21 DIAGNOSIS — J9611 Chronic respiratory failure with hypoxia: Secondary | ICD-10-CM

## 2016-10-21 MED ORDER — UMECLIDINIUM BROMIDE 62.5 MCG/INH IN AEPB: 1.0000 | INHALATION_SPRAY | Freq: Every day | RESPIRATORY_TRACT | 11 refills | Status: DC

## 2016-10-21 NOTE — Patient Instructions (Signed)
Stop Lonhala and restart Incruse one click each am  See calendar for specific medication instructions and bring it back for each and every office visit for every healthcare provider you see.  Without it,  you may not receive the best quality medical care that we feel you deserve.  You will note that the calendar groups together  your maintenance  medications that are timed at particular times of the day.  Think of this as your checklist for what your doctor has instructed you to do until your next evaluation to see what benefit  there is  to staying on a consistent group of medications intended to keep you well.  The other group at the bottom is entirely up to you to use as you see fit  for specific symptoms that may arise between visits that require you to treat them on an as needed basis.  Think of this as your action plan or "what if" list.   Separating the top medications from the bottom group is fundamental to providing you adequate care going forward.    See Tammy NP in 3 months  with all your medications, even over the counter meds, separated in two separate bags, the ones you take no matter what vs the ones you stop once you feel better and take only as needed when you feel you need them.   Tammy  will generate for you a new user friendly medication calendar that will put [place] all on the same page re: your medication use.     Without this process, it simply isn't possible to assure that we are providing  your outpatient care  with  the attention to detail we feel you deserve.   If we cannot assure that you're getting that kind of care,  then we cannot manage your problem effectively from this clinic.  Once you have seen Tammy and we are sure that we're all on the same page with your medication use she will arrange follow up with me.

## 2016-10-21 NOTE — Progress Notes (Signed)
Subjective:   Patient ID: JEMEL ONO, male    DOB: 01/07/1941  MRN: [id number]  Brief patient profile:  59 yowm  Quit smoking 03/2015  from [place] with variable cough starting Fall on Nov 2014 referred 03/27/2013 by Dr Etter Sjogren for abn CT c/w asbestosis with GOLD III copd with min  reversibility by pfts 04/28/13 and ? acei cough     History of Present Illness  03/27/2013 1st [facility] office visit/ [name] still active smoker cc recurrent episodes of severe refractory cough this episode started x 3 weeks prior to OV   but comes and goes since early fall 2014 assoc with doe x  walking garbage to street x 3 years better up to several hours after combivent on tudorza bid miant as well.  Cough is variably prod of thick yellow mucus but min amt, mostly prod in am and dry rest of the day.  Has noct choking spells just about every night p lies down better if sits up and takes combivent - they feel almost  like something strangling him.  rec Stop lisinopril Benicar 20/12.5 one daily  Pantoprazole (protonix) 40 mg   Take 30-60 min before first meal of the day and Pepcid ac 20 mg one bedtime until return to office   The key is to stop smoking completely before smoking completely stops you!    04/04/2013  Acute  ov/Clovis Warwick re: refractory cough  Chief Complaint  Patient presents with  . Acute Visit    Pt c/o increased cough since last visit. Cough is non prod and worse at night and early am. He states that he gets SOB with coughing spells and is using combivent 5 times daily. He states had episode of syncope last night and EMS was called.    really not sob unless coughing. No excess mucus, present 24/7  rec Stop tudorza Prednisone 10 mg take  4 each am x 2 days,   2 each am x 2 days,  1 each am x 2 days and stop  mucinex dm 1200 twice daily as long as you feel the urge to cough and supplement it with Tramadol 50 up to 2 to every 4 hours- once the cough is gone back off of it. Prednisone 10 mg take  4  each am x 2 days,   2 each am x 2 days,  1 each am x 2 days and stop  Pantoprazole (protonix) 40 mg   Take 30-60 min before first meal of the day and Pepcid 20 mg one bedtime until return to office - this is the best way to tell whether stomach acid is contributing to your problem.   GERD diet   04/28/2013 f/u ov/Greg Eckrich re: GOLD III COPD / cough much better off ACEi Chief Complaint  Patient presents with  . Follow-up    Pt states breathing is slightly improved and cough is much improved. He is using combivent approx 4 times per day.   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 160 - 12.5 one daily  Ok to stop pepcid to see what effect this has on your cough and if worse restart, if no change in symptoms x sev weeks then  stop protonix too to see if really need it Start breo open it once  each am (x 2 drags to be sure you  get it all) Only use your combivent as needed       Admit date: 04/16/2014 Discharge date: 04/17/2014   Discharge Diagnoses:    COPD exacerbation   Acute respiratory failure  Hyperlipidemia  Essential hypertension   06/04/2014 f/u ov/Suni Jarnagin re: GOLD III / severe AS/ stopped smoking around 04/23/14  Chief Complaint  Patient presents with  . Acute Visit    Pt c/o increased SOB off and on since Feb 2016. He states that he wakes up in the night feeling out of breath and gets SOB walking from room to room at home. He is using combivent 5-6 x per day.    sleeping propped up now using noct combivent as well as q am breo   Cancelled ov with Dr [name] today "I wanted to get my breathing right first" rec Prednisone 10 mg take  4 each am x 2 days,   2 each am x 2 days,  1 each am x 2 days and stop  Add pepcid 20 mg at bedtime (over the counter) Prop up as much as possible at bedtime  No change Breo each am combivent up to 1 puff every 6 hours as needed  Keep appt with T  surgery    Last T surgery note  12/24/14  Patient returns for routine follow-up up approximately one month status post transcatheter aortic valve replacement using a 26 mm Edwards Sapien XT transcatheter heart valve placed via trans-apical approach on 11/20/2014. His early postoperative recovery in the hospital was notable for paroxysmal atrial fibrillation for which he was treated with amiodarone. He was discharged from the hospital on the seventh postoperative day in sinus rhythm. Because of his history of recurrent GI bleeding in the past he was not anticoagulated using warfarin. Dual antiplatelets therapy using aspirin and Plavix was resumed because of recent PCI and stenting of the right coronary artery. He was readmitted to the hospital one week later with melena and symptomatic anemia with hemoglobin 5.1. He was transfused a total of 5 units packed red blood cells and underwent both upper and lower endoscopy with ablation of multiple AVMs. During his hospitalization he also underwent left thoracentesis for a moderate sized left pleural effusion. He was eventually discharged home and he returns to our office today for routine follow-up. He was seen in follow-up earlier today by Dr. Henrene Pastor. He has not had any further signs of GI bleeding and his hemoglobin has remained stable 11.5. The patient states that he is still limited by exertional shortness of breath that is multifactorial and related to his underlying COPD and chronic diastolic congestive heart failure. He has not had any chest pain or chest tightness. Appetite is good. Home health physical therapy has been working with him and he has been making slow but steady progress. He does not wish to participate in outpatient cardiac rehabilitation program.    01/10/2015  Ext f/u  ov/Taiylor Virden re:  Re-establish care post op COPD GOLD III on Breo and prn combivent  Chief Complaint  Patient presents with  . Follow-up    COPD f/u pt. states breathing has  wrosen. SOB with activity. no wheezing. no cough. no chest pain/tightness. on 3L O2 pulse.   Can walk up and back driveway RA  2 x then checks 02 sats 88-89% then walks 2 more  Times was easier before surgery and not on 02 at that point  but still on BREO Finished amio one week prior to OV   Wearing 02  hs  3lpm  rec Stop breo and  combivent start stiolto 2 pffs each am  Only use your albuterol (proair) as a rescue medication 02  2lpm sleeping and walking  No need for 02 inside the house with activties during the day  unless excercising  Prednisone 10 mg take  4 each am x 2 days,   2 each am x 2 days,  1 each am x 2 days and stop  Please schedule a follow up office visit in 6 weeks, call sooner if needed with pfts    02/22/2015  f/u ov/Shenia Alan re: GOLD III copd/ still smoking on anoro / alb 2 puffs around lunch time  Chief Complaint  Patient presents with  . Follow-up    pt following fore COPD with PFT today: pt c/o the regular SOB, and dry cough at times. no c/o wheezing or chest tightness.   walking up to 4 min back and forth to mb on 2lpm   rec Continue Anoro one click each am   Only use your albuterol (proair) as a rescue medication  02  2lpm sleeping and walking  No need for 02 inside the house with activties during the day  unless excercising   Admit date: 05/18/2015 Discharge date: 05/20/2015  Discharge Diagnoses:   COPD exacerbation  Hyperlipidemia  Iron deficiency anemia  Essential hypertension  GERD  Aortic stenosis, severe  Former smoker  S/P TAVR (transcatheter aortic valve replacement)  CAD (coronary artery disease)  Depression  Acute on chronic respiratory failure (HCC)  Elevated lactic acid level   05/24/2015  Extended transition of care  ov/Jaslyn Bansal re: COPD  GOLD III criteria/ 02 rx 2lpm with activity and hs   Chief Complaint  Patient presents with  . Follow-up    Recently admitted to hospital from 05/18/15-03/18/16 with COPD flare. He states had to to  back to ED on 05/21/15 with SOB and had neg CT Angio. He states that his breathing seems better today and breathing is back at baseline.   on ppi pc and anoro plus freq saba in multiple forms  rec Plan A = Automatic = stop Anoro and try BEVESPI  Take 2 puffs first thing in am and then another 2 puffs about 12 hours later.                                     Change protonix to 40mg  Take 30-60 min before first meal of the day and add Pepcid ac 20 mg at bedtime  Plan B = Backup Only use your albuterol as a rescue medication Plan C = Crisis - only use your albuterol nebulizer if you first try Plan B and it fails to help > ok to use the nebulizer up to every 4 hours but if start needing it regularly call for immediate appointment Plan D = Doctor - call me if B and C not adequate Plan E = ER - go to ER or call 911 if all else fails       01/21/2016  f/u ov/Muranda Coye re: copd III/ 02 hs and prn/ maint maint brov/bud/incruse and pred 10 mg daily  Chief Complaint  Patient presents with  . Follow-up    Today is a bad day with his breathing. He has noticed some wheezing in the am after he uses neb- lies down for a few min and it goes away.  Doe = MMRC4  = sob if tries to leave home or while getting dressed   rec See calendar for specific medication instructions    I will send Dr Marlou Porch a message re your follow up    04/23/2016  f/u ov/Neelie Welshans re:  COPD III/ 02 dep at hs/ maint brov/bud/incruse and prednisone 10 mg daily  Chief Complaint  Patient presents with  . Follow-up    29mo rov. pt states breathing is up & down. pt c/o sob with exertion & wheezing mainly in the morning after neb treatment.   incruse costing 130 a month and prefers nebs  Doe still mmrc = 4  avg use of saba hfa once per day around noon, never needs duoneb rec When lonhala arrives start taking it twice daily after the budesonide/foromoterol neb and stop the INCRUSE as per med calendar      10/21/2016  f/u ov/Vernona Peake re: copd GOLD  III/ 02 dep/ pred dep / very easily confused with details of care Chief Complaint  Patient presents with  . Follow-up    Breathing started to worsen a few days ago, but better since he increased pred to 20 mg. He states that he does notice some wheezing, but only for approx 15 min after he uses his neb. He is using ventolin 1 x daily on average and has not needed the Duonebs.   sleeps fine p tamazepam 30 mg hs  Wakes up 6 am daily with min cough/ congestion but "worse p first neb rx = supposed to be brovana/bud Not convinced lonhala better than incruse and problems getting PA  [facility] = can't walk a nl pace on a flat grade s sob but does fine slow and flat eg still doing [employer] with 02 2lpm  leaning on cart Presently pred 20 mg / day and improved from the 10 mg dose x last 2 weeks  No obvious day to day or daytime variability or assoc excess/ purulent sputum or mucus plugs or hemoptysis or cp or chest tightness,  or overt sinus or hb symptoms. No unusual exp hx or h/o childhood pna/ asthma or knowledge of premature birth.  Sleeping ok without nocturnal  or early am exacerbation  of respiratory  c/o's or need for noct saba. Also denies any obvious fluctuation of symptoms with weather or environmental changes or other aggravating or alleviating factors except as outlined above   Current Medications, Allergies, Complete Past Medical History, Past Surgical History, Family History, and Social History were reviewed in [employer] record.  ROS  The following are not active complaints unless bolded sore throat, dysphagia, dental problems, itching, sneezing,  nasal congestion or excess/ purulent secretions, ear ache,   fever, chills, sweats, unintended wt loss, classically pleuritic or exertional cp,  orthopnea pnd or leg swelling, presyncope, palpitations, abdominal pain, anorexia, nausea, vomiting, diarrhea  or change in bowel or bladder habits, change in stools or urine,  dysuria,hematuria,  rash, arthralgias, visual complaints, headache, numbness, weakness or ataxia or problems with walking or coordination,  change in mood/affect or memory.                        Objective:   Physical Exam    amb wm nad    / vital signs reviewed / note sats 95% on arrival on RA   06/04/2014        137 > 01/10/2015  136 > 02/22/2015  138 > 05/24/2015  135 >  08/23/2015    134  > 12/09/2015  141 > 01/21/2016  148> 04/23/2016  159 > 10/21/2016   152     HEENT mild turbinate edema. Full dentures  Oropharynx no thrush or excess pnd or cobblestoning.  No JVD or cervical adenopathy. Mild accessory muscle hypertrophy. Trachea midline, nl thryroid. Chest was hyperinflated by percussion with diminished breath sounds bilaterally  moderate increased exp time with min late exp wheeze better with plm.  Hoover sign positive at mid inspiration. Regular rate and rhythm with  II-III /VI sem no  gallop or rub or increase P2  -  No  edema  Abd: no hsm, nl excursion. Ext warm without cyanosis or clubbing.        I personally reviewed images and agree with radiology impression as follows:  pCXR:   09/05/16 Chronic changes throughout the chest.  No acute findings.

## 2016-10-21 NOTE — Telephone Encounter (Signed)
Spoke with patient. He is aware of the process. He verbalized understanding. Nothing else needed at time of call.

## 2016-10-21 NOTE — Telephone Encounter (Signed)
Spoke with pt, calling back about an old vm from previous telephone encounter.  Will close message.

## 2016-10-22 ENCOUNTER — Telehealth: Payer: Self-pay | Admitting: Internal Medicine

## 2016-10-22 NOTE — Assessment & Plan Note (Addendum)
-   PFT's 04/28/2013  FEV1 0.88 (40%) with ratio 44 and 14% better p B2 dlco 45 corrects to 83 - Trial of breo 04/28/2013 > improved symptoms  06/09/2013  - spirometry 06/04/2014 FEV1  0.76 (29%) ratio 45  -01/10/2015   trial of stiolto > insurance declined  - PFT's  02/22/2015  FEV1 0.84 (40 % ) ratio 41  p 5 % improvement from saba with DLCO  40 % corrects to 51 % for alv volume   - 02/22/15  rec rehab/ declined - 05/24/2015  extensive coaching HFA effectiveness =    75% > try change anoro to BEVESPI  - multilple admits > tried on daliresp > no better so 08/23/2015 rec d/c when present bottle used up   - 08/23/2015 pred rx daily 20 if doing poor/ 10 if doing better  -med calendar 09/06/2015  - 12/09/2015  After extensive coaching HFA effectiveness =    75% (short Ti)  - Lonhala trial 04/23/2016 >>> no change in symptom control reported 10/21/2016 and if can't access the lonhala via [facility] then should resume Incruse one click eac am   Continues steroid dependent in fact up to 20 mg per day despite max attempts to control symtoms by other means and still struggling with self rx despite use of med calendar but nothing else to offer   I had an extended discussion with the patient reviewing all relevant studies completed to date and  lasting 15 to 20 minutes of a 25 minute visit.  Formulary restrictions will be an ongoing challenge for the forseable future and I would be happy to pick an alternative if the pt will first  provide me a list of them but pt  will need to return here for training for any new device that is required eg dpi vs hfa vs respimat.    In meantime we can always provide samples so the patient never runs out of any needed respiratory medications.    Each maintenance medication was reviewed in detail including most importantly the difference between maintenance and prns and under what circumstances the prns are to be triggered using an action plan format that is not reflected in the computer generated  alphabetically organized AVS but trather by a customized med calendar that reflects the AVS meds with confirmed 100% correlation.   In addition, Please see AVS for unique instructions that I personally wrote and verbalized to the the pt in detail and then reviewed with pt  by my nurse highlighting any  changes in therapy recommended at today's visit to their plan of care.

## 2016-10-22 NOTE — Telephone Encounter (Signed)
Called [employer] - spoke with Ronald Bell last OV notes 10/21/16 - pt was advised to stop Lonhala  Patient Instructions by Tanda Rockers, MD at 10/21/2016 2:00 PM    Stop Lonhala and restart Incruse one click each am  See calendar for specific medication instructions and bring it back for each and every office visit for every healthcare provider you see.  Without it,  you may not receive the best quality medical care that we feel you deserve.  You will note that the calendar groups together  your maintenance  medications that are timed at particular times of the day.  Think of this as your checklist for what your doctor has instructed you to do until your next evaluation to see what benefit  there is  to staying on a consistent group of medications intended to keep you well.  The other group at the bottom is entirely up to you to use as you see fit  for specific symptoms that may arise between visits that require you to treat them on an as needed basis.  Think of this as your action plan or "what if" list.   Separating the top medications from the bottom group is fundamental to providing you adequate care going forward.    See Tammy NP in 3 months  with all your medications, even over the counter meds, separated in two separate bags, the ones you take no matter what vs the ones you stop once you feel better and take only as needed when you feel you need them.   Tammy  will generate for you a new user friendly medication calendar that will put [place] all on the same page re: your medication use.     Without this process, it simply isn't possible to assure that we are providing  your outpatient care  with  the attention to detail we feel you deserve.   If we cannot assure that you're getting that kind of care,  then we cannot manage your problem effectively from this clinic.  Once you have seen Tammy and we are sure that we're all on the same page with your medication use she will arrange follow  up with me.      PA cancelled. Nothing further needed.

## 2016-10-22 NOTE — Assessment & Plan Note (Signed)
HC03  12/17/14  30  01/10/2015   Walked RA  2 laps @ 185 ft each stopped due to  Sob with sat 89% at nl pace - 05/24/2015  Walked 2lpm x 3 laps @ 185 ft each stopped due to  End of study, nl pace, no  desat  Min sob  - HCO3  12/06/15  = 30  - HCO3  09/05/16  = 24 suggesing resolution of hypercarbic component   rec as o 10/21/2016  02 2lpm  hs and with activity as needed - ok resting sats RA 10/21/2016

## 2016-10-26 ENCOUNTER — Other Ambulatory Visit: Payer: Self-pay | Admitting: Family Medicine

## 2016-10-28 ENCOUNTER — Telehealth: Payer: Self-pay | Admitting: Internal Medicine

## 2016-10-28 NOTE — Telephone Encounter (Signed)
Spoke with CMM, advised that the patient had been seen by MW on 8/1 and was advised to stop using the Lonhala inhaler. They will finish closing out the PA. Nothing else was needed at time of call.

## 2016-11-02 ENCOUNTER — Telehealth: Payer: Self-pay | Admitting: Internal Medicine

## 2016-11-02 NOTE — Telephone Encounter (Signed)
Ok to change incruse to [employer]

## 2016-11-02 NOTE — Telephone Encounter (Signed)
Deirdre Evener is one bid

## 2016-11-02 NOTE — Telephone Encounter (Signed)
Spoke with Jake at [facility], aware that we are wanting to proceed with Lonhala and d/c Incruse. lmtcb X1 for pt to make aware of rx changes.  Also need to verify which pharmacy to send Lonhala rx to- previously sent to both [facility] [address] and [facility].

## 2016-11-02 NOTE — Telephone Encounter (Signed)
Key [id number] Received a call from [facility] with CMM, Ronald Bell was approved through insurance about 3 days ago.   Dr [name] please advise if you want to continue Incruse therapy or reconsider the [facility]. Thanks.

## 2016-11-03 ENCOUNTER — Other Ambulatory Visit: Payer: Self-pay | Admitting: *Deleted

## 2016-11-03 DIAGNOSIS — I350 Nonrheumatic aortic (valve) stenosis: Secondary | ICD-10-CM

## 2016-11-03 NOTE — Telephone Encounter (Signed)
lmtcb x2 for pt. 

## 2016-11-04 NOTE — Telephone Encounter (Signed)
Pt states that the Ronald Bell is still $600.00 a month copay with insurance coverage.  Pt states that he is going to start back on the Incruse.  Will send to MW as Ronald Bell.

## 2016-11-04 NOTE — Telephone Encounter (Signed)
lmtcb x3 for pt. 

## 2016-11-10 ENCOUNTER — Other Ambulatory Visit: Payer: Self-pay | Admitting: Internal Medicine

## 2016-11-10 MED ORDER — UMECLIDINIUM BROMIDE 62.5 MCG/INH IN AEPB: 1.0000 | INHALATION_SPRAY | Freq: Every day | RESPIRATORY_TRACT | 3 refills | Status: DC

## 2016-11-16 ENCOUNTER — Other Ambulatory Visit: Payer: Self-pay | Admitting: Internal Medicine

## 2016-11-16 DIAGNOSIS — J441 Chronic obstructive pulmonary disease with (acute) exacerbation: Secondary | ICD-10-CM

## 2016-11-17 ENCOUNTER — Telehealth: Payer: Self-pay | Admitting: *Deleted

## 2016-11-17 NOTE — Telephone Encounter (Signed)
Received Physician Orders from [facility] via mail; forwarded to provider with [facility] envelope attached/SLS 08/28

## 2016-11-18 ENCOUNTER — Other Ambulatory Visit: Payer: Self-pay | Admitting: Family Medicine

## 2016-11-18 DIAGNOSIS — F411 Generalized anxiety disorder: Secondary | ICD-10-CM

## 2016-11-19 ENCOUNTER — Telehealth: Payer: Self-pay | Admitting: Family Medicine

## 2016-11-19 ENCOUNTER — Ambulatory Visit: Payer: Medicare Other | Admitting: Family Medicine

## 2016-11-19 ENCOUNTER — Other Ambulatory Visit: Payer: Self-pay

## 2016-11-19 NOTE — Telephone Encounter (Signed)
Alprazolam denied/last filled 8.6.18/next OV 9.4.18/has enough to last till OV/thx dmf

## 2016-11-19 NOTE — Telephone Encounter (Signed)
No charge and block 

## 2016-11-19 NOTE — Telephone Encounter (Signed)
Pt called in at 9:00 to reschedule apt, pt said that he doesn't feel well today he would like to go back to bed. Rescheduled pt for Tuesday at 2:15

## 2016-11-24 ENCOUNTER — Telehealth: Payer: Self-pay | Admitting: Family Medicine

## 2016-11-24 ENCOUNTER — Other Ambulatory Visit: Payer: Self-pay | Admitting: Cardiology

## 2016-11-24 ENCOUNTER — Encounter: Payer: Self-pay | Admitting: Family Medicine

## 2016-11-24 ENCOUNTER — Ambulatory Visit (INDEPENDENT_AMBULATORY_CARE_PROVIDER_SITE_OTHER): Payer: Medicare Other | Admitting: Family Medicine

## 2016-11-24 ENCOUNTER — Other Ambulatory Visit: Payer: Self-pay | Admitting: Family Medicine

## 2016-11-24 VITALS — BP 124/50 | HR 77 | Temp 98.3°F | Ht 62.5 in | Wt 150.8 lb

## 2016-11-24 DIAGNOSIS — E782 Mixed hyperlipidemia: Secondary | ICD-10-CM

## 2016-11-24 DIAGNOSIS — I2583 Coronary atherosclerosis due to lipid rich plaque: Secondary | ICD-10-CM

## 2016-11-24 DIAGNOSIS — F411 Generalized anxiety disorder: Secondary | ICD-10-CM

## 2016-11-24 DIAGNOSIS — D509 Iron deficiency anemia, unspecified: Secondary | ICD-10-CM | POA: Diagnosis not present

## 2016-11-24 DIAGNOSIS — F322 Major depressive disorder, single episode, severe without psychotic features: Secondary | ICD-10-CM

## 2016-11-24 DIAGNOSIS — I10 Essential (primary) hypertension: Secondary | ICD-10-CM

## 2016-11-24 DIAGNOSIS — K219 Gastro-esophageal reflux disease without esophagitis: Secondary | ICD-10-CM

## 2016-11-24 DIAGNOSIS — F419 Anxiety disorder, unspecified: Secondary | ICD-10-CM

## 2016-11-24 DIAGNOSIS — D500 Iron deficiency anemia secondary to blood loss (chronic): Secondary | ICD-10-CM | POA: Diagnosis not present

## 2016-11-24 DIAGNOSIS — J8410 Pulmonary fibrosis, unspecified: Secondary | ICD-10-CM

## 2016-11-24 DIAGNOSIS — Z952 Presence of prosthetic heart valve: Secondary | ICD-10-CM

## 2016-11-24 DIAGNOSIS — I2510 Atherosclerotic heart disease of native coronary artery without angina pectoris: Secondary | ICD-10-CM

## 2016-11-24 DIAGNOSIS — E785 Hyperlipidemia, unspecified: Secondary | ICD-10-CM

## 2016-11-24 DIAGNOSIS — J449 Chronic obstructive pulmonary disease, unspecified: Secondary | ICD-10-CM

## 2016-11-24 DIAGNOSIS — Z23 Encounter for immunization: Secondary | ICD-10-CM | POA: Diagnosis not present

## 2016-11-24 MED ORDER — PANTOPRAZOLE SODIUM 40 MG PO TBEC: 40.0000 mg | DELAYED_RELEASE_TABLET | Freq: Every day | ORAL | 3 refills | Status: DC

## 2016-11-24 MED ORDER — FENOFIBRATE 160 MG PO TABS: 160.0000 mg | ORAL_TABLET | Freq: Every day | ORAL | 0 refills | Status: DC

## 2016-11-24 MED ORDER — FERROUS SULFATE 325 (65 FE) MG PO TBEC: 325.0000 mg | DELAYED_RELEASE_TABLET | Freq: Two times a day (BID) | ORAL | 11 refills | Status: DC

## 2016-11-24 MED ORDER — ALPRAZOLAM 0.5 MG PO TABS: 0.5000 mg | ORAL_TABLET | Freq: Three times a day (TID) | ORAL | 1 refills | Status: DC | PRN

## 2016-11-24 MED ORDER — ATORVASTATIN CALCIUM 40 MG PO TABS: 40.0000 mg | ORAL_TABLET | Freq: Every day | ORAL | 1 refills | Status: DC

## 2016-11-24 MED ORDER — CITALOPRAM HYDROBROMIDE 20 MG PO TABS: 20.0000 mg | ORAL_TABLET | Freq: Every day | ORAL | 3 refills | Status: DC

## 2016-11-24 NOTE — Assessment & Plan Note (Signed)
Well controlled, no changes to meds. Encouraged heart healthy diet such as the DASH diet and exercise as tolerated.  °

## 2016-11-24 NOTE — Assessment & Plan Note (Signed)
Per pulm 

## 2016-11-24 NOTE — Patient Instructions (Signed)

## 2016-11-24 NOTE — Telephone Encounter (Signed)
I called wife and she got very upset that Dr. Etter Sjogren would not prescribe this medication. Tried to explain to her about hospice and palliative but she states pt will not allow anyone in the home to come help. She states she will just try to bring him to his appt so we can see how bad he is.

## 2016-11-24 NOTE — Progress Notes (Signed)
Patient ID: Ronald Bell, male    DOB: 28-Nov-1940  Age: 76 y.o. MRN: [id number]    Subjective:  Subjective  HPI Ronald Bell presents for f/u ---- pt breathing worsening per wife but pt states as long as he stays still his is ok.  Palliative care still going to house on occasion.  No other complaints.   Review of Systems  Constitutional: Negative for appetite change, diaphoresis, fatigue and unexpected weight change.  Eyes: Negative for pain, redness and visual disturbance.  Respiratory: Positive for cough, chest tightness, shortness of breath and wheezing.   Cardiovascular: Negative for chest pain, palpitations and leg swelling.  Endocrine: Negative for cold intolerance, heat intolerance, polydipsia, polyphagia and polyuria.  Genitourinary: Negative for difficulty urinating, dysuria and frequency.  Neurological: Negative for dizziness, light-headedness, numbness and headaches.    History Past Medical History:  Diagnosis Date  . Angiodysplasia of intestine with hemorrhage    large and SB, gastric AVMs.   . Anxiety   . Arthritis    "left shoulder" (10/19/2014)  . Carotid artery occlusion   . Carotid artery stenosis 04/22/2012  . COPD GOLD III with min reversibilty  08/11/2006   Followed in Pulmonary clinic/ [facility]/ [place] - PFT's 04/28/2013  FEV1 0.88 (40%) with ratio 44 and 14% better p B2 dlco 45 corrects to 83 - Trial of breo 04/28/2013 > improved symptoms  06/09/2013  - spirometry 06/04/2014 FEV1  0.76 (29%) ratio 45      . Coronary artery disease   . GERD 11/30/2008   Qualifier: Diagnosis of  By: Marijean Niemann CMA, Danielle    . GERD (gastroesophageal reflux disease)   . GI bleed 2010   4 units PRBCs  . History of blood transfusion "couple times"   "related to bleeding in colon and esophagus"  . Hx of adenomatous colonic polyps 2012, 2013.   Marland Kitchen Hyperlipidemia   . Hypertension   . Iron deficiency anemia 01/25/2009   Qualifier: Diagnosis of  By: Henrene Pastor MD, Docia Chuck   .  Irregular heartbeat   . On home oxygen therapy    "2L at night" (07/23/2015)  . Peripheral vascular disease ([place])   . Pleural plaque with presence of asbestos 03/27/2013   Followed in Pulmonary clinic/ [facility]/ [place] - F/u CT 09/08/2013 1. Stable extensive calcified pleural plaque formation consistent with asbestos exposure. 2. Multiple pulmonary nodules are unchanged from the CT of 6 months ago. Given risk factors for lung cancer, continued follow up is recommended with chest CT in 6 months> done 04/20/14 no change >repeat in 12 m in tickle file     . Polyp of nasal cavity   . PVD (peripheral vascular disease) ([place]) 10/18/2012  . Rosacea   . 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 Shingles   . Tobacco abuse     He has a past surgical history that includes Iliac artery stent (Left, 2005); Esophagogastroduodenoscopy (2012); Colonoscopy (July 2015); Tonsillectomy; Knee arthroscopy with medial menisectomy (Left, 03/08/2014); left and right heart catheterization with coronary angiogram (N/A, 06/28/2014); Esophagogastroduodenoscopy (N/A, 08/17/2014); Colonoscopy (N/A, 08/17/2014); Cardiac catheterization (2001; 06/28/2014); Cardiac catheterization (N/A, 10/19/2014); Transcatheter aortic valve replacement, transapical (N/A, 11/20/2014); TEE without cardioversion (N/A, 11/20/2014); Rib plating (Left, 11/20/2014); Colonoscopy (N/A, 12/05/2014); enteroscopy (N/A, 12/05/2014); and TEE without cardioversion (N/A, 12/19/2015).   His family history includes CAD in his daughter; Colitis in his father; Heart disease in his  brother; Hyperlipidemia in his brother; Hypertension in his brother and son; Lung disease in his mother.He reports that he quit smoking about 19 months ago. His smoking use included Cigarettes and E-cigarettes. He has a 60.00 pack-year smoking history. He has never used smokeless tobacco. He reports  that he drinks about 8.4 oz of alcohol per week . He reports that he does not use drugs.  Current Outpatient Prescriptions on File Prior to Visit  Medication Sig Dispense Refill  . albuterol (PROVENTIL HFA;VENTOLIN HFA) 108 (90 Base) MCG/ACT inhaler Inhale 2 puffs into the lungs every 6 (six) hours as needed for wheezing or shortness of breath (Plan B).    Marland Kitchen aspirin 81 MG tablet Take 81 mg by mouth every morning.     . bisoprolol (ZEBETA) 5 MG tablet Take 0.5 tablets (2.5 mg total) by mouth daily. 30 tablet 6  . BROVANA 15 MCG/2ML NEBU USE 1 VIAL IN NEBULIZER TWICE DAILY 120 mL 3  . budesonide (PULMICORT) 0.5 MG/2ML nebulizer solution USE ONE VIAL (2ML) IN NEBULIZER TWICE DAILY 120 mL 11  . docusate sodium (COLACE) 100 MG capsule Take 100-200 mg by mouth See admin instructions. 1 capsule by mouth in the morning and 2 at bedtime     . famotidine (PEPCID) 20 MG tablet Take 20 mg by mouth at bedtime.     . furosemide (LASIX) 20 MG tablet Take 20 mg by mouth daily.    Marland Kitchen guaiFENesin (MUCINEX) 600 MG 12 hr tablet Take 1 tablet (600 mg total) by mouth 2 (two) times daily. 10 tablet 0  . ipratropium-albuterol (DUONEB) 0.5-2.5 (3) MG/3ML SOLN Take 3 mLs by nebulization every 4 (four) hours as needed (shortness of breath and wheezing plan C).    . Multiple Vitamins-Minerals (CENTRUM SILVER PO) Take 1 tablet by mouth daily.     . OXYGEN Use 2L at bedtime and as needed for shortness of breath    . predniSONE (DELTASONE) 20 MG tablet Take 1 tablet (20 mg total) by mouth daily with breakfast. 16 tablet 0  . sodium chloride (OCEAN) 0.65 % SOLN nasal spray Place 1-2 sprays into both nostrils as needed for congestion.     . temazepam (RESTORIL) 30 MG capsule TAKE ONE CAPSULE BY MOUTH AT BEDTIME AS NEEDED FOR SLEEP (Patient taking differently: TAKE 30 MG BY MOUTH AT BEDTIME) 30 capsule 5  . vitamin C (ASCORBIC ACID) 500 MG tablet Take 500 mg by mouth daily.    Marland Kitchen umeclidinium bromide (INCRUSE ELLIPTA) 62.5 MCG/INH  AEPB Inhale 1 puff into the lungs daily. (Patient not taking: Reported on 11/24/2016) 180 each 3   No current facility-administered medications on file prior to visit.      Objective:  Objective  Physical Exam  Constitutional: He is oriented to person, place, and time. Vital signs are normal. He appears well-developed and well-nourished. He is sleeping.  HENT:  Head: Normocephalic and atraumatic.  Mouth/Throat: Oropharynx is clear and moist.  Eyes: Pupils are equal, round, and reactive to light. EOM are normal.  Neck: Normal range of motion. Neck supple. No thyromegaly present.  Cardiovascular: Normal rate and regular rhythm.   No murmur heard. Pulmonary/Chest: Effort normal. No respiratory distress. He has decreased breath sounds. He has wheezes. He has no rales. He exhibits no tenderness.  Musculoskeletal: He exhibits no edema or tenderness.  Neurological: He is alert and oriented to person, place, and time.  Skin: Skin is warm and dry.  Psychiatric: He has a normal mood and affect.  His behavior is normal. Judgment and thought content normal.  Nursing note and vitals reviewed.  BP (!) 124/50 (Patient Position: Sitting, Cuff Size: Normal)   Pulse 77   Temp 98.3 F (36.8 C) (Oral)   Ht 5' 2.5" (1.588 m)   Wt 150 lb 12.8 oz (68.4 kg)   SpO2 94%   BMI 27.14 kg/m  Wt Readings from Last 3 Encounters:  11/24/16 150 lb 12.8 oz (68.4 kg)  10/21/16 152 lb (68.9 kg)  09/08/16 154 lb (69.9 kg)     Lab Results  Component Value Date   WBC 13.6 (H) 09/05/2016   HGB 12.4 (L) 09/05/2016   HCT 37.7 (L) 09/05/2016   PLT 169 09/05/2016   GLUCOSE 120 (H) 09/05/2016   CHOL 184 07/14/2016   TRIG 173.0 (H) 07/14/2016   HDL 61.30 07/14/2016   LDLDIRECT 114.0 05/09/2015   LDLCALC 89 07/14/2016   ALT 38 07/14/2016   AST 29 07/14/2016   NA 139 09/05/2016   K 3.6 09/05/2016   CL 105 09/05/2016   CREATININE 1.08 09/05/2016   BUN 12 09/05/2016   CO2 24 09/05/2016   TSH 1.206 06/01/2014    PSA 1.65 05/09/2015   INR 1.27 12/03/2014   HGBA1C 6.1 (H) 11/16/2014   MICROALBUR 1.1 03/05/2014    Dg Chest Port 1 View  Result Date: 09/05/2016 CLINICAL DATA:  Shortness of breath today. EXAM: PORTABLE CHEST 1 VIEW COMPARISON:  12/09/2015 and chest CT 07/23/2015 FINDINGS: Single view of the chest demonstrates patchy densities throughout both sides of the chest. Findings related to chronic changes and diffuse pleural calcifications. Streaky densities in the mid left lung are chronic. No large areas of new airspace disease. Heart size is within normal limits and stable. Atherosclerotic calcifications at the aortic arch. Patient had a transcatheter aortic valve replacement. Negative for a pneumothorax. IMPRESSION: Chronic changes throughout the chest.  No acute findings. Electronically Signed   By: Markus Daft M.D.   On: 09/05/2016 11:07     Assessment & Plan:  Plan  I have changed Ronald Bell ALPRAZolam and fenofibrate. I am also having him maintain his Multiple Vitamins-Minerals (CENTRUM SILVER PO), aspirin, OXYGEN, famotidine, sodium chloride, ipratropium-albuterol, albuterol, furosemide, bisoprolol, temazepam, docusate sodium, vitamin C, budesonide, predniSONE, guaiFENesin, umeclidinium bromide, BROVANA, pantoprazole, ferrous sulfate, citalopram, and atorvastatin.  Meds ordered this encounter  Medications  . ALPRAZolam (XANAX) 0.5 MG tablet    Sig: Take 1 tablet (0.5 mg total) by mouth 3 (three) times daily as needed. for anxiety    Dispense:  90 tablet    Refill:  1  . pantoprazole (PROTONIX) 40 MG tablet    Sig: Take 1 tablet (40 mg total) by mouth daily.    Dispense:  90 tablet    Refill:  3  . fenofibrate 160 MG tablet    Sig: Take 1 tablet (160 mg total) by mouth daily.    Dispense:  90 tablet    Refill:  0  . ferrous sulfate 325 (65 FE) MG EC tablet    Sig: Take 1 tablet (325 mg total) by mouth 2 (two) times daily.    Dispense:  60 tablet    Refill:  11  . citalopram  (CELEXA) 20 MG tablet    Sig: Take 1 tablet (20 mg total) by mouth daily.    Dispense:  90 tablet    Refill:  3  . atorvastatin (LIPITOR) 40 MG tablet    Sig: Take 1 tablet (40 mg total) by  mouth daily.    Dispense:  90 tablet    Refill:  1    Problem List Items Addressed This Visit      Unprioritized   GERD   Relevant Medications   pantoprazole (PROTONIX) 40 MG tablet   Hyperlipidemia   Relevant Medications   fenofibrate 160 MG tablet   atorvastatin (LIPITOR) 40 MG tablet   Other Relevant Orders   Lipid panel   Comprehensive metabolic panel   COPD GOLD III with min reversibilty  (Chronic)    Per pulm      Essential hypertension (Chronic)    Well controlled, no changes to meds. Encouraged heart healthy diet such as the DASH diet and exercise as tolerated.       Relevant Medications   fenofibrate 160 MG tablet   atorvastatin (LIPITOR) 40 MG tablet   Iron deficiency anemia (Chronic)    Check labs con't meds      Relevant Medications   ferrous sulfate 325 (65 FE) MG EC tablet   Mixed hyperlipidemia    Encouraged heart healthy diet, increase exercise, avoid trans fats, consider a krill oil cap daily      Relevant Medications   fenofibrate 160 MG tablet   atorvastatin (LIPITOR) 40 MG tablet   Pulmonary fibrosis (HCC)    Per pulm      S/P aortic valve replacement    Per cardiology       Other Visit Diagnoses    Need for prophylactic vaccination and inoculation against influenza    -  Primary   Relevant Orders   Flu vaccine HIGH DOSE PF (Fluzone High dose) (Completed)   Generalized anxiety disorder       Relevant Medications   ALPRAZolam (XANAX) 0.5 MG tablet   Anxiety       Relevant Medications   ALPRAZolam (XANAX) 0.5 MG tablet   pantoprazole (PROTONIX) 40 MG tablet   citalopram (CELEXA) 20 MG tablet   Anemia due to chronic blood loss       Relevant Medications   ferrous sulfate 325 (65 FE) MG EC tablet   Depression, major, single episode, severe  (HCC)       Relevant Medications   ALPRAZolam (XANAX) 0.5 MG tablet   citalopram (CELEXA) 20 MG tablet   Need for pneumococcal vaccination         Flu and pneum given today  Follow-up: Return in about 6 months (around 05/24/2017).  Ann Held, DO

## 2016-11-24 NOTE — Assessment & Plan Note (Signed)
Encouraged heart healthy diet, increase exercise, avoid trans fats, consider a krill oil cap daily 

## 2016-11-24 NOTE — Assessment & Plan Note (Signed)
Check labs con't meds 

## 2016-11-24 NOTE — Telephone Encounter (Signed)
Caller name:Dee  Relation to QL:RJPVGK Call back number: [phone] Pharmacy: [facility] [address]  Reason for call: pt's wife is wanted a call back in regards to pt's health, states pt is not feeling well at all. And pt is needing his rx filled and wants to know if Dr. Etter Sjogren will call all of them in for him without him having to come into the office. rxALPRAZolam (XANAX) 0.5 MG tablet states pt is completely out of this med, rxatorvastatin (LIPITOR) 40 MG tablet, furosemide (LASIX) 20 MG tablet.

## 2016-11-24 NOTE — Telephone Encounter (Signed)
Pt scheduled to see Dr. Carollee Herter today (11/24/16) at 2:15 pm.

## 2016-11-24 NOTE — Telephone Encounter (Signed)
Pt seen today

## 2016-11-24 NOTE — Telephone Encounter (Signed)
Dr. Etter Sjogren please see previous message and advise on refill request. There is a phone note on 8/30 that was sent to you about pt canceling his appt.

## 2016-11-24 NOTE — Assessment & Plan Note (Signed)
Per cardiology 

## 2016-11-24 NOTE — Telephone Encounter (Signed)
Pt showed up late for his appt.  Was asked to assess patient to ensure ER visit wasn't needed.   Pt states he feels fatigued.  He becomes short of breath with exertion.  Pt's face red.  Audible breathing can be heard.  Pt states he feels normal.  No changes from his norm.   Wt: 150.8lbs BP:124/50 O2:  94% RA HR: 77 SOB with exertion.  Spoke with Dr. Etter Sjogren.  She will see him today.

## 2016-11-24 NOTE — Telephone Encounter (Signed)
If he is unable to get here due to declining health do we need to get hospice or palliative care to come in and help so he can get his meds?  We can not write meds long term without seeing him

## 2016-11-25 ENCOUNTER — Telehealth: Payer: Self-pay | Admitting: Family Medicine

## 2016-11-25 LAB — COMPREHENSIVE METABOLIC PANEL
ALK PHOS: 31 U/L — AB (ref 39–117)
ALT: 57 U/L — AB (ref 0–53)
AST: 43 U/L — AB (ref 0–37)
Albumin: 4.7 g/dL (ref 3.5–5.2)
BILIRUBIN TOTAL: 0.5 mg/dL (ref 0.2–1.2)
BUN: 10 mg/dL (ref 6–23)
CO2: 30 meq/L (ref 19–32)
Calcium: 10.2 mg/dL (ref 8.4–10.5)
Chloride: 100 mEq/L (ref 96–112)
Creatinine, Ser: 1.03 mg/dL (ref 0.40–1.50)
GFR: 74.18 mL/min (ref >60.00)
GLUCOSE: 128 mg/dL — AB (ref 70–99)
POTASSIUM: 4.8 meq/L (ref 3.5–5.1)
SODIUM: 139 meq/L (ref 135–145)
Total Protein: 7.4 g/dL (ref 6.0–8.3)

## 2016-11-25 LAB — LIPID PANEL
CHOL/HDL RATIO: 3
Cholesterol: 203 mg/dL — ABNORMAL HIGH (ref 0–200)
HDL: 59.9 mg/dL (ref >39.00)
NonHDL: 143.23
Triglycerides: 205 mg/dL — ABNORMAL HIGH (ref 0.0–149.0)
VLDL: 41 mg/dL — AB (ref 0.0–40.0)

## 2016-11-25 LAB — LDL CHOLESTEROL, DIRECT: LDL DIRECT: 111 mg/dL

## 2016-11-25 NOTE — Telephone Encounter (Signed)
Noted. Thanks.

## 2016-11-25 NOTE — Telephone Encounter (Signed)
Per chart, Rx for Alprazolam was sent in yesterday by PCP. Pt says that he spoke with pharmacy and they didn't receive. Pt would like medication resent.    Pharmacy: [facility], [address] [place]

## 2016-11-25 NOTE — Telephone Encounter (Signed)
Relation to MO:CARE  Call back number:[phone]   Reason for call:  Patient states he didn't realize PCP handed him the Rx until after the fact, please disregard message below

## 2016-11-25 NOTE — Telephone Encounter (Signed)
Faxed during visit on 9.4.18/thx dmf

## 2016-12-03 ENCOUNTER — Other Ambulatory Visit (HOSPITAL_COMMUNITY): Payer: Medicare Other

## 2016-12-03 ENCOUNTER — Other Ambulatory Visit: Payer: Self-pay | Admitting: Family Medicine

## 2016-12-03 NOTE — Telephone Encounter (Signed)
Refaxed as pharm not rec/thx dmf

## 2016-12-03 NOTE — Telephone Encounter (Signed)
Pt says that Rx for atorvastatin wasn't received by pharmacy. He would like to have someone to look into it for him.    Please assist further.

## 2016-12-07 ENCOUNTER — Telehealth: Payer: Self-pay | Admitting: Family Medicine

## 2016-12-07 ENCOUNTER — Ambulatory Visit: Payer: Medicare Other | Admitting: Thoracic Surgery (Cardiothoracic Vascular Surgery)

## 2016-12-08 NOTE — Telephone Encounter (Signed)
This rx was sent to [facility] yesterday. Called pt and made him aware. He had no additional questions at this time. Nothing further is needed

## 2016-12-08 NOTE — Telephone Encounter (Addendum)
Relation to pt: self  Call back number: [phone] Pharmacy: [place] [facility], [place] [phone] (Phone) [phone] (Fax)     Reason for call:  Patient checking on the status of temazepam (RESTORIL) 30 MG capsule, patient completely out, please advise

## 2016-12-09 ENCOUNTER — Other Ambulatory Visit: Payer: Self-pay | Admitting: Family Medicine

## 2016-12-09 NOTE — Telephone Encounter (Signed)
Pharmacy did not received this refill Called in today 12/09/2016 and gave a verbal

## 2016-12-10 ENCOUNTER — Other Ambulatory Visit (HOSPITAL_COMMUNITY): Payer: Medicare Other

## 2016-12-10 NOTE — Telephone Encounter (Signed)
This was faxed on 9.17.18/thx dmf

## 2016-12-14 ENCOUNTER — Ambulatory Visit: Payer: Medicare Other | Admitting: Thoracic Surgery (Cardiothoracic Vascular Surgery)

## 2016-12-16 ENCOUNTER — Other Ambulatory Visit (HOSPITAL_COMMUNITY): Payer: Medicare Other

## 2016-12-21 ENCOUNTER — Ambulatory Visit: Payer: Medicare Other | Admitting: Thoracic Surgery (Cardiothoracic Vascular Surgery)

## 2016-12-24 ENCOUNTER — Other Ambulatory Visit (HOSPITAL_COMMUNITY): Payer: Medicare Other

## 2017-01-12 ENCOUNTER — Other Ambulatory Visit: Payer: Self-pay | Admitting: Family Medicine

## 2017-01-12 DIAGNOSIS — F411 Generalized anxiety disorder: Secondary | ICD-10-CM

## 2017-01-12 NOTE — Telephone Encounter (Signed)
Requesting Alprazolam 0.5mg -Take 1 tablet by mouth three times daily as needed for anxiety. Last OV:11/24/16-Next appt:05/24/17 Last refill:11/24/16;#90,1 UDS;02/12/14-No results recorded Please advise.//AB/CMA

## 2017-01-15 ENCOUNTER — Ambulatory Visit: Payer: Medicare Other | Admitting: Family Medicine

## 2017-01-19 ENCOUNTER — Other Ambulatory Visit: Payer: Self-pay | Admitting: Internal Medicine

## 2017-01-21 ENCOUNTER — Encounter: Payer: Medicare Other | Admitting: Adult Health

## 2017-01-30 ENCOUNTER — Other Ambulatory Visit: Payer: Self-pay | Admitting: Family Medicine

## 2017-01-30 DIAGNOSIS — K219 Gastro-esophageal reflux disease without esophagitis: Secondary | ICD-10-CM

## 2017-01-31 ENCOUNTER — Emergency Department (HOSPITAL_COMMUNITY): Payer: Medicare Other

## 2017-01-31 ENCOUNTER — Other Ambulatory Visit: Payer: Self-pay

## 2017-01-31 ENCOUNTER — Inpatient Hospital Stay (HOSPITAL_COMMUNITY)
Admission: EM | Admit: 2017-01-31 | Discharge: 2017-02-08 | DRG: 286 | Disposition: A | Discharge status: Home Health | Payer: Medicare Other | Attending: Internal Medicine | Admitting: Internal Medicine

## 2017-01-31 ENCOUNTER — Encounter (HOSPITAL_COMMUNITY): Payer: Self-pay | Admitting: Emergency Medicine

## 2017-01-31 DIAGNOSIS — I509 Heart failure, unspecified: Secondary | ICD-10-CM

## 2017-01-31 DIAGNOSIS — J9622 Acute and chronic respiratory failure with hypercapnia: Secondary | ICD-10-CM | POA: Diagnosis not present

## 2017-01-31 DIAGNOSIS — R000 Tachycardia, unspecified: Secondary | ICD-10-CM

## 2017-01-31 DIAGNOSIS — I10 Essential (primary) hypertension: Secondary | ICD-10-CM | POA: Diagnosis present

## 2017-01-31 DIAGNOSIS — I480 Paroxysmal atrial fibrillation: Secondary | ICD-10-CM | POA: Diagnosis not present

## 2017-01-31 DIAGNOSIS — R739 Hyperglycemia, unspecified: Secondary | ICD-10-CM | POA: Diagnosis present

## 2017-01-31 DIAGNOSIS — N179 Acute kidney failure, unspecified: Secondary | ICD-10-CM | POA: Diagnosis present

## 2017-01-31 DIAGNOSIS — Z952 Presence of prosthetic heart valve: Secondary | ICD-10-CM

## 2017-01-31 DIAGNOSIS — I5033 Acute on chronic diastolic (congestive) heart failure: Secondary | ICD-10-CM | POA: Diagnosis not present

## 2017-01-31 DIAGNOSIS — I350 Nonrheumatic aortic (valve) stenosis: Secondary | ICD-10-CM

## 2017-01-31 DIAGNOSIS — J810 Acute pulmonary edema: Secondary | ICD-10-CM

## 2017-01-31 DIAGNOSIS — J441 Chronic obstructive pulmonary disease with (acute) exacerbation: Secondary | ICD-10-CM | POA: Diagnosis present

## 2017-01-31 DIAGNOSIS — I5023 Acute on chronic systolic (congestive) heart failure: Secondary | ICD-10-CM

## 2017-01-31 DIAGNOSIS — Z7952 Long term (current) use of systemic steroids: Secondary | ICD-10-CM | POA: Diagnosis not present

## 2017-01-31 DIAGNOSIS — F1010 Alcohol abuse, uncomplicated: Secondary | ICD-10-CM | POA: Diagnosis present

## 2017-01-31 DIAGNOSIS — E1122 Type 2 diabetes mellitus with diabetic chronic kidney disease: Secondary | ICD-10-CM | POA: Diagnosis present

## 2017-01-31 DIAGNOSIS — Z8249 Family history of ischemic heart disease and other diseases of the circulatory system: Secondary | ICD-10-CM

## 2017-01-31 DIAGNOSIS — T380X5A Adverse effect of glucocorticoids and synthetic analogues, initial encounter: Secondary | ICD-10-CM | POA: Diagnosis present

## 2017-01-31 DIAGNOSIS — Z7189 Other specified counseling: Secondary | ICD-10-CM

## 2017-01-31 DIAGNOSIS — T82857A Stenosis of cardiac prosthetic devices, implants and grafts, initial encounter: Secondary | ICD-10-CM | POA: Diagnosis present

## 2017-01-31 DIAGNOSIS — R0602 Shortness of breath: Secondary | ICD-10-CM

## 2017-01-31 DIAGNOSIS — R0902 Hypoxemia: Secondary | ICD-10-CM

## 2017-01-31 DIAGNOSIS — I5043 Acute on chronic combined systolic (congestive) and diastolic (congestive) heart failure: Secondary | ICD-10-CM | POA: Diagnosis present

## 2017-01-31 DIAGNOSIS — Z87891 Personal history of nicotine dependence: Secondary | ICD-10-CM | POA: Diagnosis not present

## 2017-01-31 DIAGNOSIS — F419 Anxiety disorder, unspecified: Secondary | ICD-10-CM | POA: Diagnosis present

## 2017-01-31 DIAGNOSIS — I130 Hypertensive heart and chronic kidney disease with heart failure and stage 1 through stage 4 chronic kidney disease, or unspecified chronic kidney disease: Principal | ICD-10-CM | POA: Diagnosis present

## 2017-01-31 DIAGNOSIS — Z9981 Dependence on supplemental oxygen: Secondary | ICD-10-CM

## 2017-01-31 DIAGNOSIS — Z515 Encounter for palliative care: Secondary | ICD-10-CM | POA: Diagnosis not present

## 2017-01-31 DIAGNOSIS — I2510 Atherosclerotic heart disease of native coronary artery without angina pectoris: Secondary | ICD-10-CM | POA: Diagnosis present

## 2017-01-31 DIAGNOSIS — J9620 Acute and chronic respiratory failure, unspecified whether with hypoxia or hypercapnia: Secondary | ICD-10-CM | POA: Diagnosis present

## 2017-01-31 DIAGNOSIS — D500 Iron deficiency anemia secondary to blood loss (chronic): Secondary | ICD-10-CM | POA: Diagnosis present

## 2017-01-31 DIAGNOSIS — E1151 Type 2 diabetes mellitus with diabetic peripheral angiopathy without gangrene: Secondary | ICD-10-CM | POA: Diagnosis present

## 2017-01-31 DIAGNOSIS — R05 Cough: Secondary | ICD-10-CM | POA: Diagnosis not present

## 2017-01-31 DIAGNOSIS — E785 Hyperlipidemia, unspecified: Secondary | ICD-10-CM | POA: Diagnosis present

## 2017-01-31 DIAGNOSIS — R069 Unspecified abnormalities of breathing: Secondary | ICD-10-CM | POA: Diagnosis not present

## 2017-01-31 DIAGNOSIS — I5041 Acute combined systolic (congestive) and diastolic (congestive) heart failure: Secondary | ICD-10-CM | POA: Diagnosis not present

## 2017-01-31 DIAGNOSIS — J9621 Acute and chronic respiratory failure with hypoxia: Secondary | ICD-10-CM

## 2017-01-31 DIAGNOSIS — I340 Nonrheumatic mitral (valve) insufficiency: Secondary | ICD-10-CM | POA: Diagnosis not present

## 2017-01-31 DIAGNOSIS — K219 Gastro-esophageal reflux disease without esophagitis: Secondary | ICD-10-CM | POA: Diagnosis present

## 2017-01-31 DIAGNOSIS — R9431 Abnormal electrocardiogram [ECG] [EKG]: Secondary | ICD-10-CM | POA: Diagnosis present

## 2017-01-31 DIAGNOSIS — Z7982 Long term (current) use of aspirin: Secondary | ICD-10-CM

## 2017-01-31 DIAGNOSIS — E1165 Type 2 diabetes mellitus with hyperglycemia: Secondary | ICD-10-CM | POA: Diagnosis present

## 2017-01-31 DIAGNOSIS — N183 Chronic kidney disease, stage 3 (moderate): Secondary | ICD-10-CM | POA: Diagnosis present

## 2017-01-31 DIAGNOSIS — I110 Hypertensive heart disease with heart failure: Secondary | ICD-10-CM | POA: Diagnosis not present

## 2017-01-31 DIAGNOSIS — Y831 Surgical operation with implant of artificial internal device as the cause of abnormal reaction of the patient, or of later complication, without mention of misadventure at the time of the procedure: Secondary | ICD-10-CM | POA: Diagnosis present

## 2017-01-31 DIAGNOSIS — Z955 Presence of coronary angioplasty implant and graft: Secondary | ICD-10-CM

## 2017-01-31 DIAGNOSIS — I248 Other forms of acute ischemic heart disease: Secondary | ICD-10-CM | POA: Diagnosis not present

## 2017-01-31 LAB — CBC WITH DIFFERENTIAL/PLATELET
Basophils Absolute: 0.1 10*3/uL (ref 0.0–0.1)
Basophils Relative: 1 %
EOS ABS: 0.4 10*3/uL (ref 0.0–0.7)
Eosinophils Relative: 3 %
HEMATOCRIT: 43 % (ref 39.0–52.0)
HEMOGLOBIN: 13.8 g/dL (ref 13.0–17.0)
LYMPHS ABS: 3.5 10*3/uL (ref 0.7–4.0)
LYMPHS PCT: 21 %
MCH: 31.9 pg (ref 26.0–34.0)
MCHC: 32.1 g/dL (ref 30.0–36.0)
MCV: 99.3 fL (ref 78.0–100.0)
Monocytes Absolute: 1.5 10*3/uL — ABNORMAL HIGH (ref 0.1–1.0)
Monocytes Relative: 9 %
NEUTROS ABS: 11.2 10*3/uL — AB (ref 1.7–7.7)
NEUTROS PCT: 66 %
Platelets: 213 10*3/uL (ref 150–400)
RBC: 4.33 MIL/uL (ref 4.22–5.81)
RDW: 13.8 % (ref 11.5–15.5)
WBC: 16.8 10*3/uL — ABNORMAL HIGH (ref 4.0–10.5)

## 2017-01-31 LAB — BASIC METABOLIC PANEL
ANION GAP: 9 (ref 5–15)
BUN: 11 mg/dL (ref 6–20)
CHLORIDE: 102 mmol/L (ref 101–111)
CO2: 25 mmol/L (ref 22–32)
Calcium: 9.1 mg/dL (ref 8.9–10.3)
Creatinine, Ser: 1.07 mg/dL (ref 0.61–1.24)
GFR calc non Af Amer: >60 mL/min (ref >60)
Glucose, Bld: 299 mg/dL — ABNORMAL HIGH (ref 65–99)
POTASSIUM: 4.3 mmol/L (ref 3.5–5.1)
SODIUM: 136 mmol/L (ref 135–145)

## 2017-01-31 LAB — MRSA PCR SCREENING: MRSA BY PCR: NEGATIVE

## 2017-01-31 LAB — TROPONIN I
TROPONIN I: 0.22 ng/mL — AB (ref <0.03)
Troponin I: 6.34 ng/mL (ref <0.03)

## 2017-01-31 LAB — I-STAT ARTERIAL BLOOD GAS, ED
Acid-base deficit: 1 mmol/L (ref 0.0–2.0)
Bicarbonate: 26 mmol/L (ref 20.0–28.0)
O2 SAT: 96 %
PCO2 ART: 51.1 mmHg — AB (ref 32.0–48.0)
PH ART: 7.315 — AB (ref 7.350–7.450)
TCO2: 28 mmol/L (ref 22–32)
pO2, Arterial: 90 mmHg (ref 83.0–108.0)

## 2017-01-31 LAB — HEMOGLOBIN A1C
HEMOGLOBIN A1C: 6.2 % — AB (ref 4.8–5.6)
MEAN PLASMA GLUCOSE: 131.24 mg/dL

## 2017-01-31 LAB — I-STAT TROPONIN, ED: TROPONIN I, POC: 0.04 ng/mL (ref 0.00–0.08)

## 2017-01-31 LAB — GLUCOSE, CAPILLARY
Glucose-Capillary: 184 mg/dL — ABNORMAL HIGH (ref 65–99)
Glucose-Capillary: 150 mg/dL — ABNORMAL HIGH (ref 65–99)

## 2017-01-31 LAB — BRAIN NATRIURETIC PEPTIDE: B Natriuretic Peptide: 986 pg/mL — ABNORMAL HIGH (ref 0.0–100.0)

## 2017-01-31 MED ORDER — ALBUTEROL SULFATE (2.5 MG/3ML) 0.083% IN NEBU: 5.0000 mg | INHALATION_SOLUTION | Freq: Once | RESPIRATORY_TRACT | Status: DC

## 2017-01-31 MED ORDER — METHYLPREDNISOLONE SODIUM SUCC 125 MG IJ SOLR: 60.0000 mg | Freq: Four times a day (QID) | INTRAMUSCULAR | Status: DC

## 2017-01-31 MED ORDER — INSULIN ASPART 100 UNIT/ML ~~LOC~~ SOLN
0.0000 [IU] | Freq: Three times a day (TID) | SUBCUTANEOUS | Status: DC
  Administered 2017-01-31: 3 [IU] via SUBCUTANEOUS
  Administered 2017-02-01: 2 [IU] via SUBCUTANEOUS
  Administered 2017-02-03 – 2017-02-06 (×4): 3 [IU] via SUBCUTANEOUS
  Administered 2017-02-07 – 2017-02-08 (×2): 2 [IU] via SUBCUTANEOUS

## 2017-01-31 MED ORDER — LORAZEPAM 2 MG/ML IJ SOLN
1.0000 mg | Freq: Once | INTRAMUSCULAR | Status: AC
  Administered 2017-01-31: 1 mg via INTRAVENOUS
  Filled 2017-01-31: qty 1

## 2017-01-31 MED ORDER — GUAIFENESIN 100 MG/5ML PO SOLN: 10.0000 mL | ORAL | Status: DC | PRN

## 2017-01-31 MED ORDER — ACETAMINOPHEN 325 MG PO TABS: 650.0000 mg | ORAL_TABLET | Freq: Four times a day (QID) | ORAL | Status: DC | PRN

## 2017-01-31 MED ORDER — IPRATROPIUM-ALBUTEROL 0.5-2.5 (3) MG/3ML IN SOLN
3.0000 mL | RESPIRATORY_TRACT | Status: DC
  Administered 2017-01-31 – 2017-02-03 (×15): 3 mL via RESPIRATORY_TRACT
  Filled 2017-01-31 (×12): qty 3

## 2017-01-31 MED ORDER — DOCUSATE SODIUM 100 MG PO CAPS
100.0000 mg | ORAL_CAPSULE | Freq: Two times a day (BID) | ORAL | Status: DC
  Administered 2017-01-31 – 2017-02-08 (×17): 100 mg via ORAL
  Filled 2017-01-31 (×17): qty 1

## 2017-01-31 MED ORDER — FOLIC ACID 1 MG PO TABS
1.0000 mg | ORAL_TABLET | Freq: Every day | ORAL | Status: DC
  Administered 2017-01-31 – 2017-02-08 (×9): 1 mg via ORAL
  Filled 2017-01-31 (×9): qty 1

## 2017-01-31 MED ORDER — ADULT MULTIVITAMIN W/MINERALS CH
1.0000 | ORAL_TABLET | Freq: Every day | ORAL | Status: DC
  Administered 2017-01-31 – 2017-02-08 (×9): 1 via ORAL
  Filled 2017-01-31 (×10): qty 1

## 2017-01-31 MED ORDER — LORAZEPAM 2 MG/ML IJ SOLN
1.0000 mg | Freq: Four times a day (QID) | INTRAMUSCULAR | Status: DC | PRN
  Administered 2017-01-31 – 2017-02-01 (×2): 1 mg via INTRAVENOUS
  Filled 2017-01-31: qty 1

## 2017-01-31 MED ORDER — THIAMINE HCL 100 MG/ML IJ SOLN: 100.0000 mg | Freq: Every day | INTRAMUSCULAR | Status: DC

## 2017-01-31 MED ORDER — SODIUM CHLORIDE 0.9 % IV BOLUS (SEPSIS)
500.0000 mL | Freq: Once | INTRAVENOUS | Status: AC
  Administered 2017-01-31: 500 mL via INTRAVENOUS

## 2017-01-31 MED ORDER — LORAZEPAM 2 MG/ML IJ SOLN
0.0000 mg | Freq: Four times a day (QID) | INTRAMUSCULAR | Status: DC
  Administered 2017-02-01: 1 mg via INTRAVENOUS
  Administered 2017-02-02: 2 mg via INTRAVENOUS
  Filled 2017-01-31 (×3): qty 1

## 2017-01-31 MED ORDER — INSULIN ASPART 100 UNIT/ML ~~LOC~~ SOLN: 0.0000 [IU] | Freq: Every day | SUBCUTANEOUS | Status: DC

## 2017-01-31 MED ORDER — ALBUTEROL SULFATE (2.5 MG/3ML) 0.083% IN NEBU
INHALATION_SOLUTION | RESPIRATORY_TRACT | Status: AC
  Administered 2017-01-31: 5 mg
  Filled 2017-01-31: qty 6

## 2017-01-31 MED ORDER — DOCUSATE SODIUM 100 MG PO CAPS: 100.0000 mg | ORAL_CAPSULE | ORAL | Status: DC

## 2017-01-31 MED ORDER — LEVOFLOXACIN 500 MG PO TABS
500.0000 mg | ORAL_TABLET | Freq: Every day | ORAL | Status: AC
  Administered 2017-01-31 – 2017-02-04 (×5): 500 mg via ORAL
  Filled 2017-01-31 (×5): qty 1

## 2017-01-31 MED ORDER — ONDANSETRON HCL 4 MG PO TABS: 4.0000 mg | ORAL_TABLET | Freq: Four times a day (QID) | ORAL | Status: DC | PRN

## 2017-01-31 MED ORDER — FENOFIBRATE 160 MG PO TABS
160.0000 mg | ORAL_TABLET | Freq: Every day | ORAL | Status: DC
  Administered 2017-01-31 – 2017-02-07 (×8): 160 mg via ORAL
  Filled 2017-01-31 (×8): qty 1

## 2017-01-31 MED ORDER — FERROUS SULFATE 325 (65 FE) MG PO TABS
325.0000 mg | ORAL_TABLET | Freq: Two times a day (BID) | ORAL | Status: DC
  Administered 2017-01-31 – 2017-02-08 (×16): 325 mg via ORAL
  Filled 2017-01-31 (×16): qty 1

## 2017-01-31 MED ORDER — TEMAZEPAM 7.5 MG PO CAPS: 7.5000 mg | ORAL_CAPSULE | Freq: Every evening | ORAL | Status: DC | PRN

## 2017-01-31 MED ORDER — CITALOPRAM HYDROBROMIDE 20 MG PO TABS
20.0000 mg | ORAL_TABLET | Freq: Every day | ORAL | Status: DC
  Administered 2017-01-31 – 2017-02-08 (×9): 20 mg via ORAL
  Filled 2017-01-31 (×9): qty 1

## 2017-01-31 MED ORDER — ACETAMINOPHEN 650 MG RE SUPP: 650.0000 mg | Freq: Four times a day (QID) | RECTAL | Status: DC | PRN

## 2017-01-31 MED ORDER — FUROSEMIDE 10 MG/ML IJ SOLN
80.0000 mg | Freq: Two times a day (BID) | INTRAMUSCULAR | Status: DC
  Administered 2017-01-31 – 2017-02-02 (×3): 80 mg via INTRAVENOUS
  Filled 2017-01-31 (×4): qty 8

## 2017-01-31 MED ORDER — ONDANSETRON HCL 4 MG/2ML IJ SOLN: 4.0000 mg | Freq: Four times a day (QID) | INTRAMUSCULAR | Status: DC | PRN

## 2017-01-31 MED ORDER — ALBUTEROL SULFATE (2.5 MG/3ML) 0.083% IN NEBU: 10.0000 mL | INHALATION_SOLUTION | Freq: Once | RESPIRATORY_TRACT | Status: DC

## 2017-01-31 MED ORDER — UMECLIDINIUM BROMIDE 62.5 MCG/INH IN AEPB: 1.0000 | INHALATION_SPRAY | Freq: Every day | RESPIRATORY_TRACT | Status: DC

## 2017-01-31 MED ORDER — FUROSEMIDE 10 MG/ML IJ SOLN
80.0000 mg | Freq: Once | INTRAMUSCULAR | Status: AC
  Administered 2017-01-31: 80 mg via INTRAVENOUS
  Filled 2017-01-31: qty 8

## 2017-01-31 MED ORDER — GUAIFENESIN ER 600 MG PO TB12
600.0000 mg | ORAL_TABLET | Freq: Two times a day (BID) | ORAL | Status: DC
  Administered 2017-01-31 – 2017-02-08 (×17): 600 mg via ORAL
  Filled 2017-01-31 (×17): qty 1

## 2017-01-31 MED ORDER — ATORVASTATIN CALCIUM 40 MG PO TABS
40.0000 mg | ORAL_TABLET | Freq: Every day | ORAL | Status: DC
  Administered 2017-01-31 – 2017-02-07 (×7): 40 mg via ORAL
  Filled 2017-01-31 (×8): qty 1

## 2017-01-31 MED ORDER — LORAZEPAM 2 MG/ML IJ SOLN: 0.0000 mg | Freq: Two times a day (BID) | INTRAMUSCULAR | Status: DC

## 2017-01-31 MED ORDER — LORAZEPAM 1 MG PO TABS
1.0000 mg | ORAL_TABLET | Freq: Four times a day (QID) | ORAL | Status: DC | PRN
Start: 2017-01-31 — End: 2017-02-03
  Administered 2017-02-01 – 2017-02-02 (×2): 1 mg via ORAL
  Filled 2017-01-31 (×2): qty 1

## 2017-01-31 MED ORDER — GUAIFENESIN ER 600 MG PO TB12: 600.0000 mg | ORAL_TABLET | Freq: Two times a day (BID) | ORAL | Status: DC

## 2017-01-31 MED ORDER — METHYLPREDNISOLONE SODIUM SUCC 40 MG IJ SOLR
40.0000 mg | Freq: Two times a day (BID) | INTRAMUSCULAR | Status: DC
  Administered 2017-01-31 – 2017-02-03 (×6): 40 mg via INTRAVENOUS
  Filled 2017-01-31 (×6): qty 1

## 2017-01-31 MED ORDER — VITAMIN B-1 100 MG PO TABS
100.0000 mg | ORAL_TABLET | Freq: Every day | ORAL | Status: DC
  Administered 2017-02-01 – 2017-02-08 (×8): 100 mg via ORAL
  Filled 2017-01-31 (×9): qty 1

## 2017-01-31 MED ORDER — FAMOTIDINE 20 MG PO TABS
20.0000 mg | ORAL_TABLET | Freq: Every day | ORAL | Status: DC
  Administered 2017-01-31 – 2017-02-07 (×8): 20 mg via ORAL
  Filled 2017-01-31 (×8): qty 1

## 2017-01-31 MED ORDER — BISOPROLOL FUMARATE 5 MG PO TABS
2.5000 mg | ORAL_TABLET | Freq: Every day | ORAL | Status: DC
  Administered 2017-01-31 – 2017-02-08 (×9): 2.5 mg via ORAL
  Filled 2017-01-31 (×4): qty 1
  Filled 2017-01-31: qty 0.5
  Filled 2017-01-31 (×5): qty 1

## 2017-01-31 MED ORDER — ENOXAPARIN SODIUM 40 MG/0.4ML ~~LOC~~ SOLN
40.0000 mg | SUBCUTANEOUS | Status: DC
  Administered 2017-01-31 – 2017-02-07 (×7): 40 mg via SUBCUTANEOUS
  Filled 2017-01-31 (×8): qty 0.4

## 2017-01-31 MED ORDER — ALBUTEROL SULFATE (2.5 MG/3ML) 0.083% IN NEBU
INHALATION_SOLUTION | RESPIRATORY_TRACT | Status: AC
  Administered 2017-01-31: 10:00:00
  Filled 2017-01-31: qty 12

## 2017-01-31 NOTE — ED Notes (Signed)
Notified admitting provider Dyanne Carrel critical troponin 0.22. Stated to also notify cardiology. Cardiology was paged by Network engineer.

## 2017-01-31 NOTE — ED Notes (Signed)
Doctor spoke with patient's wife stated he drinks a 12 pack of beer everyday.

## 2017-01-31 NOTE — Consult Note (Signed)
Cardiology Consultation:   Patient ID: Ronald Bell; [id number]; [birth year]   Admit date: 01/31/2017 Date of Consult: 01/31/2017  Primary Care Provider: Carollee Herter, Alferd Apa, DO Primary Cardiologist: MS    Patient Profile:   Ronald Bell is a 76 y.o. male with a hx of Aortic stenosis who is being seen today for the evaluation of dyspnea and sinus tach at the request of Dr courage.  History of Present Illness:   Mr. [name]  Seen in the emergency room because of tachycardia in the context of a COPD exacerbation-a recurring theme.  He is followed by Dr. Leonides Schanz and is on home oxygen.  He has been on continuous albuterol since being picked up by the ambulance.  There was a concern about STEMI.  Dr. Leda Gauze looked at the electrocardiogram felt that this was not the case.  We were then called.  He remains quite tachycardic.  He is on BiPAP with some improvement in respiratory function.  Chest x-ray suggests edema.  BNP is elevated at 950.  Last check 6/18 and it was 300 is distinct from his baseline.   He has aortic stenosis and underwent TAVR.  This is been complicated.  When seen 3/18 concern re worsening transvalvular gradient up to 20m/s, and a gradient of 38 mm raising the concern of leaflet cusp malfunction.  Anticoagulation was recommended and declined.   He also has coronary artery disease and underwent RCA stenting with dual antiplatelet therapy.  He also has a history of atrial fibrillation for which she does not take anticoagulation because of GI bleeding ascribed to documented AVMs..     Past Medical History:  Diagnosis Date  . Angiodysplasia of intestine with hemorrhage    large and SB, gastric AVMs.   . Anxiety   . Arthritis    "left shoulder" (10/19/2014)  . Carotid artery occlusion   . Carotid artery stenosis 04/22/2012  . COPD GOLD III with min reversibilty  08/11/2006   Followed in Pulmonary clinic/ [facility]/ [facility] - PFT's 04/28/2013  FEV1 0.88  (40%) with ratio 44 and 14% better p B2 dlco 45 corrects to 83 - Trial of breo 04/28/2013 > improved symptoms  06/09/2013  - spirometry 06/04/2014 FEV1  0.76 (29%) ratio 45      . Coronary artery disease   . GERD 11/30/2008   Qualifier: Diagnosis of  By: Marijean Niemann CMA, Danielle    . GERD (gastroesophageal reflux disease)   . GI bleed 2010   4 units PRBCs  . History of blood transfusion "couple times"   "related to bleeding in colon and esophagus"  . Hx of adenomatous colonic polyps 2012, 2013.   Marland Kitchen Hyperlipidemia   . Hypertension   . Iron deficiency anemia 01/25/2009   Qualifier: Diagnosis of  By: Henrene Pastor MD, Docia Chuck   . Irregular heartbeat   . On home oxygen therapy    "2L at night" (07/23/2015)  . Peripheral vascular disease ([place])   . Pleural plaque with presence of asbestos 03/27/2013   Followed in Pulmonary clinic/ [facility]/ [facility] - F/u CT 09/08/2013 1. Stable extensive calcified pleural plaque formation consistent with asbestos exposure. 2. Multiple pulmonary nodules are unchanged from the CT of 6 months ago. Given risk factors for lung cancer, continued follow up is recommended with chest CT in 6 months> done 04/20/14 no change >repeat in 12 m in tickle file     . Polyp of nasal cavity   . PVD (peripheral vascular disease) ([place])  10/18/2012  . Rosacea   . 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 Shingles   . Tobacco abuse     Past Surgical History:  Procedure Laterality Date  . CARDIAC CATHETERIZATION  2001; 06/28/2014  . COLONOSCOPY  July 2015   Dr. Henrene Pastor  . ESOPHAGOGASTRODUODENOSCOPY  2012   normal  . ILIAC ARTERY STENT Left 2005   CIA  . TONSILLECTOMY         Inpatient Medications: Scheduled Meds: . atorvastatin  40 mg Oral Daily  . bisoprolol  2.5 mg Oral Daily  . citalopram  20 mg Oral Daily  . docusate sodium  100-200 mg Oral See admin instructions  . enoxaparin  (LOVENOX) injection  40 mg Subcutaneous Q24H  . famotidine  20 mg Oral QHS  . fenofibrate  160 mg Oral Daily  . folic acid  1 mg Oral Daily  . furosemide  80 mg Intravenous BID  . guaiFENesin  600 mg Oral BID  . guaiFENesin  600 mg Oral BID  . insulin aspart  0-15 Units Subcutaneous TID WC  . insulin aspart  0-5 Units Subcutaneous QHS  . Iron  1 tablet Oral BID  . LORazepam  0-4 mg Intravenous Q6H   Followed by  . [START ON 02/02/2017] LORazepam  0-4 mg Intravenous Q12H  . methylPREDNISolone (SOLU-MEDROL) injection  60 mg Intravenous Q6H  . multivitamin with minerals  1 tablet Oral Daily  . thiamine  100 mg Oral Daily   Or  . thiamine  100 mg Intravenous Daily  . umeclidinium bromide  1 puff Inhalation Daily   Continuous Infusions:  PRN Meds: acetaminophen **OR** acetaminophen, LORazepam **OR** LORazepam, ondansetron **OR** ondansetron (ZOFRAN) IV, temazepam  Allergies:   No Known Allergies  Social History:   Social History   Socioeconomic History  . Marital status: Married    Spouse name: Not on file  . Number of children: 4  . Years of education: Not on file  . Highest education level: Not on file  Social Needs  . Financial resource strain: Not on file  . Food insecurity - worry: Not on file  . Food insecurity - inability: Not on file  . Transportation needs - medical: Not on file  . Transportation needs - non-medical: Not on file  Occupational History  . Occupation: Oncologist: [employer]  Tobacco Use  . Smoking status: Former Smoker    Packs/day: 1.00    Years: 60.00    Pack years: 60.00    Types: Cigarettes, E-cigarettes    Last attempt to quit: 04/18/2015    Years since quitting: 1.7  . Smokeless tobacco: Never Used  Substance and Sexual Activity  . Alcohol use: Yes    Alcohol/week: 7.2 oz    Types: 12 Cans of beer per week  . Drug use: No  . Sexual activity: No    Partners: Female  Other Topics Concern  . Not on file   Social History Narrative  . Not on file    Family History:    Family History  Problem Relation Age of Onset  . Lung disease Mother        pulm fibrosis  . Colitis Father   . Heart disease Brother   . Hypertension Brother   . Hyperlipidemia Brother   . CAD Daughter        cad  . Hypertension  Son   . Colon cancer Neg Hx      ROS:  Please see the history of present illness.  ROS  All other ROS reviewed and negative.     Physical Exam/Data:   Vitals:   01/31/17 1138 01/31/17 1145 01/31/17 1200 01/31/17 1215  BP: (!) 133/116 (!) 141/117 117/70 132/62  Pulse: (!) 125 (!) 128 (!) 121 (!) 107  Resp: (!) 46 (!) 33 (!) 33 (!) 28  Temp:      TempSrc:      SpO2: 94% 94% 95% 95%  Weight:      Height:       No intake or output data in the 24 hours ending 01/31/17 1223 Filed Weights   01/31/17 0958  Weight: 150 lb (68 kg)   Body mass index is 24.96 kg/m.  General:  Well nourished, well developed, in severe respiratory distress wearing BiPAP HEENT: normal Lymph: no adenopathy Neck: Unable to discern Endocrine:  No thryomegaly Vascular: No carotid bruits; FA pulses 2+ bilaterally without bruits  Cardiac: Rapid and regular S1-S2 distant.  Carotid massage associated with slowing and then re-acceleration Lungs:  clear to auscultation bilaterally, no wheezing, rhonchi or rales  Abd: soft, nontender, no hepatomegaly  Ext: no edema Musculoskeletal:  No deformities, BUE and BLE strength normal and equal Skin: warm and dry  Neuro:  CNs 2-12 intact, no focal abnormalities noted Psych:  Normal affect   EKG:  The EKG was personally reviewed and demonstrates: Narrow QRS tachycardia with difficult to discern P waves.  Carotid massage was able to slow the tachycardia and a long RP tachycardia resumed presumably sinus Telemetry:  Telemetry was personally reviewed and demonstrates:  As above     Laboratory Data:  Chemistry Recent Labs  Lab 01/31/17 0941  NA 136  K 4.3  CL 102    CO2 25  GLUCOSE 299*  BUN 11  CREATININE 1.07  CALCIUM 9.1  GFRNONAA >60  GFRAA >60  ANIONGAP 9    No results for input(s): PROT, ALBUMIN, AST, ALT, ALKPHOS, BILITOT in the last 168 hours. Hematology Recent Labs  Lab 01/31/17 0941  WBC 16.8*  RBC 4.33  HGB 13.8  HCT 43.0  MCV 99.3  MCH 31.9  MCHC 32.1  RDW 13.8  PLT 213   Cardiac EnzymesNo results for input(s): TROPONINI in the last 168 hours.  Recent Labs  Lab 01/31/17 0951  TROPIPOC 0.04    BNP Recent Labs  Lab 01/31/17 1055  BNP 986.0*    DDimer No results for input(s): DDIMER in the last 168 hours.  Radiology/Studies:  Dg Chest Port 1 View  Result Date: 01/31/2017 CLINICAL DATA:  Shortness of breath. EXAM: PORTABLE CHEST 1 VIEW COMPARISON:  August 26, 2016 chest radiograph and chest CT Jul 23, 2015 FINDINGS: Lungs are hyperexpanded. There is extensive fibrosis throughout the lungs. There is bullous disease in the upper lobes. In comparison with most recent prior study, there is increase in interstitial prominence in the lower lung zones, likely interstitial edema superimposed on fibrosis. There is no appreciable airspace consolidation. There are calcified pleural plaques, stable. There is also calcification along the right hemidiaphragm. Of heart is mildly enlarged. There appears to be a degree of relative pulmonary venous hypertension. There is aortic atherosclerosis. There is an aortic valve replacement. No adenopathy is evident. There is postoperative change involving a lower left rib. IMPRESSION: There is underlying emphysematous change with pulmonary fibrosis. There is increase in interstitial prominence, likely interstitial pulmonary edema  superimposed on emphysema. Suspect congestive heart failure with underlying emphysema and fibrosis. There also calcified pleural plaques and calcification along the right hemidiaphragm indicative of prior asbestos exposure. There is aortic atherosclerosis. Patient is status post  aortic valve replacement. Aortic Atherosclerosis (ICD10-I70.0). Electronically Signed   By: Lowella Grip III M.D.   On: 01/31/2017 10:30    Assessment and Plan:   1. COPD exacerbation with chronic oxygen dependent COPD 2. Congestive heart failure-acute on chronic 3. TAVR with recurrent aortic stenosis 4. Atrial fibrillation-paroxysmal 5. AVMs and history of GI bleeding  Not withstanding his history of repeated COPD exacerbations, the markedly atypically elevated BNP suggest that there is a component of congestive heart failure.  Not sure whether it is primary or secondary.  Troponins are negative; ECG is nonacute.  For now would treat the volume overload/BMP with intravenous diuretics as you are doing.  He will need aggressive pulmonary support.  We will follow with you.  Agree with IV diuresis.     For questions or updates, please contact [place] Please consult [email or web address] for contact info under Cardiology/STEMI.   Signed, Virl Axe, MD  01/31/2017 12:23 PM

## 2017-01-31 NOTE — Consult Note (Signed)
PULMONARY / CRITICAL CARE MEDICINE   Name: Ronald Bell MRN: [id number] DOB: June 25, 1940    ADMISSION DATE:  01/31/2017 CONSULTATION DATE:   11/11 REFERRING MD:  Denton Brick  CHIEF COMPLAINT: Acute on chronic respiratory failure  HISTORY OF PRESENT ILLNESS:   This is a 76 year old white male followed by Dr. [name] in the pulmonary clinic for chronic respiratory failure.  He has a history of gold 3 chronic obstructive pulmonary disease, with FEV1 most recently at 40%.  He is steroid dependent at baseline requiring oxygen at at bedtime.  His underlying pulmonary status has been further complicated by history of severe aortic stenosis for which he is undergone TAVR back in August 2016 now with evidence of worsening valvular dysfunction.  He presented to the emergency room on 11/11 with rather abrupt onset of shortness of breath that morning.  He notes that he was in usual state of health up until that point denying sick exposure, sinus congestion, nasal discharge, sore throat, cough, or chest pain.  Reports he got up in the morning to take his bronchodilator, felt "funny" went back to bed, noted he was getting more short of breath this progressed he then started to wheeze more and more his rescue bronchodilator did not give him relief so he called EMS.  Of note he has a very heavy history of alcohol abuse.  In the emergency room he was found to be afebrile, his saturations were 94% on noninvasive positive pressure ventilation.  He had a respiratory rate of 46 and was in acute distress.  In the ER he was continued on noninvasive positive pressure ventilation, given IV Lasix, and inhaled bronchodilators.  His initial BNP was over 900.  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  Pulmonary was asked to see given concern about risk for clinical decline.  PAST MEDICAL HISTORY :   He  has a past medical history of Angiodysplasia of intestine with hemorrhage, Anxiety, Arthritis, Carotid artery occlusion, Carotid artery stenosis (04/22/2012), COPD GOLD III with min reversibilty  (08/11/2006), Coronary artery disease, GERD (11/30/2008), GERD (gastroesophageal reflux disease), GI bleed (2010), History of blood transfusion ("couple times"), adenomatous colonic polyps (2012, 2013. ), Hyperlipidemia, Hypertension, Iron deficiency anemia (01/25/2009), Irregular heartbeat, On home oxygen therapy, Peripheral vascular disease ([place]), Pleural plaque with presence of asbestos (03/27/2013), Polyp of nasal cavity, PVD (peripheral vascular disease) ([place]) (10/18/2012), Rosacea, S/P TAVR (transcatheter aortic valve replacement) (11/20/2014), Shingles, and Tobacco abuse.  PAST SURGICAL HISTORY: He  has a past surgical history that includes Iliac artery stent (Left, 2005); Esophagogastroduodenoscopy (2012); Colonoscopy (July 2015); Tonsillectomy; Cardiac catheterization (2001; 06/28/2014); TRANSESOPHAGEAL ECHOCARDIOGRAM (TEE) (N/A, 12/19/2015); COLONOSCOPY (N/A, 12/05/2014); ENTEROSCOPY (N/A, 12/05/2014); TRANSCATHETER AORTIC VALVE REPLACEMENT, TRANSAPICAL (N/A, 11/20/2014); TRANSESOPHAGEAL ECHOCARDIOGRAM (TEE) (N/A, 11/20/2014); RIB PLATING OF LEFT 8TH RIB (Left, 11/20/2014); Coronary Stent Intervention (N/A, 10/19/2014); Percutaneous Coronary Rotoblator Intervention (Pci-R) (10/19/2014); ESOPHAGOGASTRODUODENOSCOPY (EGD) (N/A, 08/17/2014); COLONOSCOPY (N/A, 08/17/2014); LEFT AND RIGHT HEART CATHETERIZATION WITH CORONARY ANGIOGRAM (N/A, 06/28/2014); and LEFT KNEE SCOPE WITH MEDIAL MENISECTOMY AND CHONDROPLASTY (Left, 03/08/2014).  No Known Allergies  No current facility-administered medications on file prior to encounter.    Current Outpatient Medications on File Prior to Encounter  Medication Sig  . ALPRAZolam (XANAX) 0.5 MG tablet TAKE 1 TABLET BY MOUTH THREE TIMES DAILY AS NEEDED FOR ANXIETY  . aspirin 81 MG tablet  Take 81 mg by mouth every morning.   Marland Kitchen atorvastatin (LIPITOR) 40 MG tablet Take  1 tablet (40 mg total) by mouth daily.  . bisoprolol (ZEBETA) 5 MG tablet Take 0.5 tablets (2.5 mg total) by mouth daily.  Marland Kitchen BROVANA 15 MCG/2ML NEBU USE 1 VIAL IN NEBULIZER TWICE DAILY  . budesonide (PULMICORT) 0.5 MG/2ML nebulizer solution USE ONE VIAL (2ML) IN NEBULIZER TWICE DAILY  . citalopram (CELEXA) 20 MG tablet Take 1 tablet (20 mg total) by mouth daily.  Marland Kitchen docusate sodium (COLACE) 100 MG capsule Take 100-200 mg by mouth See admin instructions. 1 capsule by mouth in the morning and 2 at bedtime   . famotidine (PEPCID) 20 MG tablet Take 20 mg by mouth at bedtime.   . fenofibrate 160 MG tablet Take 1 tablet (160 mg total) by mouth daily.  . Ferrous Sulfate (IRON) 325 (65 Fe) MG TABS TAKE ONE TABLET BY MOUTH TWICE DAILY  . furosemide (LASIX) 20 MG tablet Take 20 mg by mouth daily.  Marland Kitchen guaiFENesin (MUCINEX) 600 MG 12 hr tablet Take 1 tablet (600 mg total) by mouth 2 (two) times daily.  . Multiple Vitamins-Minerals (CENTRUM SILVER PO) Take 1 tablet by mouth daily.   . OXYGEN Use 2L at bedtime and as needed for shortness of breath  . pantoprazole (PROTONIX) 40 MG tablet Take 1 tablet (40 mg total) by mouth daily.  . predniSONE (DELTASONE) 20 MG tablet Take 1 tablet (20 mg total) by mouth daily with breakfast.  . sodium chloride (OCEAN) 0.65 % SOLN nasal spray Place 1-2 sprays into both nostrils as needed for congestion.   . temazepam (RESTORIL) 30 MG capsule TAKE 1 CAPSULE BY MOUTH AT BEDTIME AS NEEDED FOR SLEEP  . umeclidinium bromide (INCRUSE ELLIPTA) 62.5 MCG/INH AEPB Inhale 1 puff into the lungs daily.  . vitamin C (ASCORBIC ACID) 500 MG tablet Take 500 mg by mouth daily.  Marland Kitchen albuterol (PROVENTIL HFA;VENTOLIN HFA) 108 (90 Base) MCG/ACT inhaler Inhale 2 puffs into the lungs every 6 (six) hours as needed for wheezing or shortness of breath (Plan B).  Marland Kitchen atorvastatin (LIPITOR) 40 MG tablet TAKE 1 TABLET BY MOUTH ONCE  DAILY  . ferrous sulfate 325 (65 FE) MG EC tablet Take 1 tablet (325 mg total) by mouth 2 (two) times daily.  Marland Kitchen ipratropium-albuterol (DUONEB) 0.5-2.5 (3) MG/3ML SOLN Take 3 mLs by nebulization every 4 (four) hours as needed (shortness of breath and wheezing plan C).    FAMILY HISTORY:  His indicated that his mother is deceased. He indicated that his father is deceased. He indicated that his sister is alive. He indicated that his brother is alive. He indicated that his maternal grandmother is deceased. He indicated that his maternal grandfather is deceased. He indicated that his paternal grandmother is deceased. He indicated that his paternal grandfather is deceased. He indicated that his daughter is deceased. He indicated that only one of his two sons is alive. He indicated that the status of his neg hx is unknown.   SOCIAL HISTORY: He  reports that he quit smoking about 21 months ago. His smoking use included cigarettes and e-cigarettes. He has a 60.00 pack-year smoking history. he has never used smokeless tobacco. He reports that he drinks about 7.2 oz of alcohol per week. He reports that he does not use drugs.  REVIEW OF SYSTEMS:   General: Denies fever chills sick exposure.  HEENT no headache sore throat nasal or sinus congestion no visual loss no hearing loss pulmonary: Positive for wheezing, no cough, no chest pain, wheezing improves somewhat with rescue bronchodilator.  No orthopnea,  did wake up short of breath however.  Cardiac no palpitations or chest pain extremities no significant edema no pain no focal weakness abdomen: No nausea vomiting diarrhea GU no urine urinary frequency or hesitancy neuro no loss of consciousness or seizure activity no focal weakness endocrine no hot or cold  intolerance  SUBJECTIVE:  Feels much better on noninvasive positive pressure ventilation  VITAL SIGNS: BP (!) 155/67   Pulse (!) 111   Temp 98.8 F (37.1 C) (Axillary)   Resp (!) 32   Ht 5\' 5"  (1.651  m)   Wt 150 lb (68 kg)   SpO2 97%   BMI 24.96 kg/m   HEMODYNAMICS:    VENTILATOR SETTINGS: FiO2 (%):  [40 %] 40 %  INTAKE / OUTPUT: No intake/output data recorded.  PHYSICAL EXAMINATION: General: 76 year old white male currently resting comfortably on noninvasive positive pressure ventilator Neuro: Awake alert oriented moves all extremities no focal deficits HEENT: Face mask in place no clear jugular venous distention Cardiovascular: Regular rate and rhythm positive murmur consistent with aortic stenosis Lungs: Basilar rales mild accessory muscle use or faint distant wheeze Abdomen: Soft nontender no organomegaly positive bowel sounds Musculoskeletal: Equal strength and bulk Skin: Warm and intact without skin breakdown  LABS:  BMET Recent Labs  Lab 01/31/17 0941  NA 136  K 4.3  CL 102  CO2 25  BUN 11  CREATININE 1.07  GLUCOSE 299*    Electrolytes Recent Labs  Lab 01/31/17 0941  CALCIUM 9.1    CBC Recent Labs  Lab 01/31/17 0941  WBC 16.8*  HGB 13.8  HCT 43.0  PLT 213    Coag's No results for input(s): APTT, INR in the last 168 hours.  Sepsis Markers No results for input(s): LATICACIDVEN, PROCALCITON, O2SATVEN in the last 168 hours.  ABG Recent Labs  Lab 01/31/17 1213  PHART 7.315*  PCO2ART 51.1*  PO2ART 90.0    Liver Enzymes No results for input(s): AST, ALT, ALKPHOS, BILITOT, ALBUMIN in the last 168 hours.  Cardiac Enzymes Recent Labs  Lab 01/31/17 1208  TROPONINI 0.22*    Glucose No results for input(s): GLUCAP in the last 168 hours.  Imaging Dg Chest Port 1 View  Result Date: 01/31/2017 CLINICAL DATA:  Shortness of breath. EXAM: PORTABLE CHEST 1 VIEW COMPARISON:  August 26, 2016 chest radiograph and chest CT Jul 23, 2015 FINDINGS: Lungs are hyperexpanded. There is extensive fibrosis throughout the lungs. There is bullous disease in the upper lobes. In comparison with most recent prior study, there is increase in interstitial  prominence in the lower lung zones, likely interstitial edema superimposed on fibrosis. There is no appreciable airspace consolidation. There are calcified pleural plaques, stable. There is also calcification along the right hemidiaphragm. Of heart is mildly enlarged. There appears to be a degree of relative pulmonary venous hypertension. There is aortic atherosclerosis. There is an aortic valve replacement. No adenopathy is evident. There is postoperative change involving a lower left rib. IMPRESSION: There is underlying emphysematous change with pulmonary fibrosis. There is increase in interstitial prominence, likely interstitial pulmonary edema superimposed on emphysema. Suspect congestive heart failure with underlying emphysema and fibrosis. There also calcified pleural plaques and calcification along the right hemidiaphragm indicative of prior asbestos exposure. There is aortic atherosclerosis. Patient is status post aortic valve replacement. Aortic Atherosclerosis (ICD10-I70.0). Electronically Signed   By: Lowella Grip III M.D.   On: 01/31/2017 10:30     STUDIES:    CULTURES:   ANTIBIOTICS: Levaquin 11/11  SIGNIFICANT  EVENTS:   LINES/TUBES:   DISCUSSION: Agree with stepdown unit admission seems to have improved with treatment in the emergency room.  Although he certainly has underlying airflow limitations with chronic respiratory failure in the setting of his COPD this presentation is not consistent with typical COPD exacerbation.  Would favor decompensated heart failure in the setting of a progressively worsening aortic valve superimposed on what is likely some degree of noncompliance.  At this point would continue to focus on aggressive diuresis, as needed noninvasive positive pressure ventilation and we will provide supportive care as far as his chronic obstructive pulmonary disease.  Given his heavy EtOH abuse he has had a exceedingly high risk for withdrawal  ASSESSMENT /  PLAN:   Acute on chronic respiratory failure in the setting of decompensated heart failure further complicated by underlying chronic obstructive pulmonary disease.   COPD with steroid and oxygen dependence FEV1 40% predicted  the presentation would be atypical for COPD exacerbation, the fact that he is improved with Lasix and positive pressure would argue further against primary COPD exacerbation. Plan Continue noninvasive positive pressure ventilation Scheduled bronchodilators Reasonable to continue systemic steroids, Solu-Medrol 40 mg every 12 Treat heart failure, agree with aggressive diuresis, however will need to watch this carefully given underlying aortic stenosis Repeat chest x-ray in a.m. Levaquin reasonable, will need to watch QTc interval, would stop it 5 days given doubtful that this is infection  Acute decompensated diastolic heart failure Rule out demand ischemia Severe aortic stenosis Seen by cardiology,  Plan Continue aggressive diuresis  telemetry Cycle cardiac enzymes Trend BNP  History of gastric AVMs Plan PPI  History of heavy EtOH abuse. Plan CIWA protocol  Diabetes with hyperglycemia Plan Sliding scale insulin    Erick Colace ACNP-BC [place] Pager # [phone] OR # [phone] if no answer   01/31/2017, 2:09 PM

## 2017-01-31 NOTE — ED Notes (Signed)
Cariology at bedside  

## 2017-01-31 NOTE — ED Notes (Signed)
Ronald Bell (wife) called/If need her call @ [phone].

## 2017-01-31 NOTE — ED Notes (Signed)
NP Black bedside

## 2017-01-31 NOTE — ED Notes (Signed)
Called radiology for STAT chest xray

## 2017-01-31 NOTE — ED Notes (Signed)
Called floor for report. Nurse assisting another patient and will call ED back.

## 2017-01-31 NOTE — Procedures (Signed)
Pt arrives via EMS on CPAP, pt states he is having difficulty breathing.  Pt does seem to be in distress, increased WOB, inc RR.  Pt placed on Bipap per MD order, Albuterol 10mg  started per MD. Pt tolerating well at this time, RT will monitor.

## 2017-01-31 NOTE — ED Notes (Signed)
NP from Critical care at bedside.

## 2017-01-31 NOTE — ED Notes (Signed)
Lab called critical troponin 0.22 notified Dr Tamera Punt.

## 2017-01-31 NOTE — Progress Notes (Signed)
Pt states that he feels fine and doesn't want to be placed on the BiPAP for the night.  RT educated pt about the benefits of using the machine but pt declined use.  However, pt did agree to be placed on the BiPAP if if has and increased WOB.  RT will continue to monitor pt and will placed if the need arises.

## 2017-01-31 NOTE — ED Provider Notes (Signed)
[facility] EMERGENCY DEPARTMENT Provider Note   CSN: [id number] Arrival date & time: 01/31/17  7096     History   Chief Complaint Chief Complaint  Patient presents with  . Shortness of Breath    HPI Ronald Bell is a 76 y.o. male.  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  History is somewhat limited due to his severe shortness of breath but he states that he has shortness of breath and wheezing that started earlier this morning.  He states that he was feeling fine yesterday.  He feels that his symptoms are similar to his prior COPD exacerbations.  He denies any chest discomfort.  He denies any cough or chest congestion.  No fevers.  No leg swelling.  He was using his nebulizer machine at home without improvement in symptoms.  He does use home oxygen but only at night.  EMS gave him 10 mg of albuterol, 0.5 of Atrovent, 2 g of magnesium and 125 mg of Solu-Medrol.  He was also placed on CPAP.  He currently does not note any improvement in symptoms with these measures.      Past Medical History:  Diagnosis Date  . Angiodysplasia of intestine with hemorrhage    large and SB, gastric AVMs.   . Anxiety   . Arthritis    "left shoulder" (10/19/2014)  . Carotid artery occlusion   . Carotid artery stenosis 04/22/2012  . COPD GOLD III with min reversibilty  08/11/2006   Followed in Pulmonary clinic/ [facility]/ [place] - PFT's 04/28/2013  FEV1 0.88 (40%) with ratio 44 and 14% better p B2 dlco 45 corrects to 83 - Trial of breo 04/28/2013 > improved symptoms  06/09/2013  - spirometry 06/04/2014 FEV1  0.76 (29%) ratio 45      . Coronary artery disease   . GERD 11/30/2008   Qualifier: Diagnosis of  By: Marijean Niemann CMA, Danielle    . GERD (gastroesophageal reflux disease)   . GI bleed 2010   4 units PRBCs  . History  of blood transfusion "couple times"   "related to bleeding in colon and esophagus"  . Hx of adenomatous colonic polyps 2012, 2013.   Marland Kitchen Hyperlipidemia   . Hypertension   . Iron deficiency anemia 01/25/2009   Qualifier: Diagnosis of  By: Henrene Pastor MD, Docia Chuck   . Irregular heartbeat   . On home oxygen therapy    "2L at night" (07/23/2015)  . Peripheral vascular disease ([place])   . Pleural plaque with presence of asbestos 03/27/2013   Followed in Pulmonary clinic/ [facility]/ [place] - F/u CT 09/08/2013 1. Stable extensive calcified pleural plaque formation consistent with asbestos exposure. 2. Multiple pulmonary nodules are unchanged from the CT of 6 months ago. Given risk factors for lung cancer, continued follow up is recommended with chest CT in 6 months> done 04/20/14 no change >repeat in 12 m in tickle file     . Polyp of nasal cavity   . PVD (peripheral vascular disease) ([place]) 10/18/2012  . Rosacea   . 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 Shingles   . Tobacco abuse     Patient Active Problem List   Diagnosis Date Noted  . ETOH abuse 01/31/2017  . Hyperglycemia 01/31/2017  .  Abnormal EKG 01/31/2017  . Pulmonary fibrosis ([place])   . Acute exacerbation of chronic obstructive pulmonary disease (COPD) ([place])   . Acute renal failure ([place])   . COPD with acute exacerbation ([place]) 07/20/2015  . Paroxysmal atrial fibrillation ([place]) 07/20/2015  . Acute on chronic diastolic HF (heart failure) ([place]) 07/20/2015  . AKI (acute kidney injury) ([place]) 07/20/2015  . COPD with exacerbation ([place]) 07/20/2015  . Mixed hyperlipidemia 05/27/2015  . Depression 05/18/2015  . Acute on chronic respiratory failure ([place]) 05/18/2015  . Elevated lactic acid level 05/18/2015  . Chronic respiratory failure with hypoxia and hypercapnia ([place]) 01/10/2015  . Pleural effusion, left   . LVH (left ventricular  hypertrophy)   . AVM (arteriovenous malformation) of small bowel, acquired with hemorrhage ([place])   . History of colonic polyps   . CAD in native artery   . S/P aortic valve replacement   . Irregular heartbeat   . Pleural effusion   . Other emphysema ([place])   . CAD (coronary artery disease) 12/03/2014  . Atrial fibrillation with rapid ventricular response ([place]) 11/23/2014  . 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 Former smoker 11/05/2014  . Unstable angina ([place]) 10/19/2014  . Angiodysplasia of stomach and duodenum   . Angiodysplasia of small intestine, except duodenum without bleeding   . Melena 08/16/2014  . Preop cardiovascular exam 08/16/2014  . Aortic stenosis   . Coronary artery disease involving native coronary artery of native heart without angina pectoris   . Angiodysplasia of colon   . Severe aortic stenosis 06/21/2014  . Shortness of breath   . Aortic stenosis, severe 05/10/2014  . COPD exacerbation ([place]) 04/16/2014  . Multiple pulmonary nodules 03/02/2014  . 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 Pain of left thigh 11/14/2013  . Cough 03/27/2013  . Pleural plaque with presence of asbestos 03/27/2013  . PVD (peripheral vascular disease) ([place]) 10/18/2012  . Carotid artery stenosis 04/22/2012  . Postherpetic neuralgia 09/28/2011  . OTHER SPECIFIED DISEASE OF WHITE BLOOD CELLS 01/17/2010  . HEMOCCULT POSITIVE STOOL 01/17/2010  . Iron deficiency anemia 01/25/2009  . [facility] 01/25/2009  . ANGIODYSPLASIA-INTESTINE 01/25/2009  . PERSONAL HX COLONIC POLYPS 01/25/2009  . ANEMIA, SECONDARY TO BLOOD LOSS 12/03/2008  . Hemorrhage of gastrointestinal tract 12/03/2008  . UNSPECIFIED ANEMIA 12/01/2008  . GERD 11/30/2008  . Bilateral carotid bruits 05/25/2008  . POLYP OF NASAL CAVITY 07/25/2007  . OTHER SPECIFIED DISORDERS OF ARTERIES&ARTERIOLES 06/24/2007  . NECK PAIN, LEFT 06/22/2007  . Benign neoplasm of colon  08/11/2006  . Hyperlipidemia 08/11/2006  . ERECTILE DYSFUNCTION 08/11/2006  . Essential hypertension 08/11/2006  . COPD GOLD III with min reversibilty  08/11/2006  . 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 BARRETT'S ESOPHAGUS, HX OF 08/11/2006  . Cigarette smoker 08/11/2006    Past Surgical History:  Procedure Laterality Date  . CARDIAC CATHETERIZATION  2001; 06/28/2014  . COLONOSCOPY  July 2015   Dr. Henrene Pastor  . ESOPHAGOGASTRODUODENOSCOPY  2012   normal  . ILIAC ARTERY STENT Left 2005   CIA  . TONSILLECTOMY         Home Medications    Prior to Admission medications   Medication Sig Start Date End Date Taking? Authorizing Provider  ALPRAZolam Duanne Moron) 0.5 MG tablet TAKE 1 TABLET BY MOUTH THREE TIMES DAILY AS NEEDED FOR ANXIETY 01/12/17  Yes Ann Held, DO  aspirin 81 MG tablet Take 81 mg by mouth every morning.    Yes [provider]  atorvastatin (LIPITOR) 40  MG tablet Take 1 tablet (40 mg total) by mouth daily. 11/24/16  Yes Roma Schanz R, DO  bisoprolol (ZEBETA) 5 MG tablet Take 0.5 tablets (2.5 mg total) by mouth daily. 06/16/16  Yes Jerline Pain, MD  BROVANA 15 MCG/2ML NEBU USE 1 VIAL IN NEBULIZER TWICE DAILY 11/16/16  Yes Tanda Rockers, MD  budesonide (PULMICORT) 0.5 MG/2ML nebulizer solution USE ONE VIAL (2ML) IN NEBULIZER TWICE DAILY 09/01/16  Yes Lowne Lyndal Pulley R, DO  citalopram (CELEXA) 20 MG tablet Take 1 tablet (20 mg total) by mouth daily. 11/24/16  Yes Roma Schanz R, DO  docusate sodium (COLACE) 100 MG capsule Take 100-200 mg by mouth See admin instructions. 1 capsule by mouth in the morning and 2 at bedtime    Yes [provider]  famotidine (PEPCID) 20 MG tablet Take 20 mg by mouth at bedtime.    Yes [provider]  fenofibrate 160 MG tablet Take 1 tablet (160 mg total) by mouth daily. 11/24/16  Yes Roma Schanz R, DO  Ferrous Sulfate (IRON) 325 (65 Fe) MG TABS TAKE ONE TABLET BY MOUTH TWICE DAILY 01/21/17  Yes Irene Shipper,  MD  furosemide (LASIX) 20 MG tablet Take 20 mg by mouth daily.   Yes [provider]  guaiFENesin (MUCINEX) 600 MG 12 hr tablet Take 1 tablet (600 mg total) by mouth 2 (two) times daily. 09/08/16 09/08/17 Yes Dhungel, Nishant, MD  Multiple Vitamins-Minerals (CENTRUM SILVER PO) Take 1 tablet by mouth daily.    Yes [provider]  OXYGEN Use 2L at bedtime and as needed for shortness of breath   Yes [provider]  pantoprazole (PROTONIX) 40 MG tablet Take 1 tablet (40 mg total) by mouth daily. 11/24/16  Yes Ann Held, DO  predniSONE (DELTASONE) 20 MG tablet Take 1 tablet (20 mg total) by mouth daily with breakfast. 09/08/16  Yes Dhungel, Nishant, MD  sodium chloride (OCEAN) 0.65 % SOLN nasal spray Place 1-2 sprays into both nostrils as needed for congestion.    Yes [provider]  temazepam (RESTORIL) 30 MG capsule TAKE 1 CAPSULE BY MOUTH AT BEDTIME AS NEEDED FOR SLEEP 12/07/16  Yes Roma Schanz R, DO  umeclidinium bromide (INCRUSE ELLIPTA) 62.5 MCG/INH AEPB Inhale 1 puff into the lungs daily. 11/10/16  Yes Tanda Rockers, MD  vitamin C (ASCORBIC ACID) 500 MG tablet Take 500 mg by mouth daily.   Yes [provider]  albuterol (PROVENTIL HFA;VENTOLIN HFA) 108 (90 Base) MCG/ACT inhaler Inhale 2 puffs into the lungs every 6 (six) hours as needed for wheezing or shortness of breath (Plan B). 12/08/15   Roma Schanz R, DO  atorvastatin (LIPITOR) 40 MG tablet TAKE 1 TABLET BY MOUTH ONCE DAILY 12/03/16   Carollee Herter, Kendrick Fries R, DO  ferrous sulfate 325 (65 FE) MG EC tablet Take 1 tablet (325 mg total) by mouth 2 (two) times daily. 11/24/16   Roma Schanz R, DO  ipratropium-albuterol (DUONEB) 0.5-2.5 (3) MG/3ML SOLN Take 3 mLs by nebulization every 4 (four) hours as needed (shortness of breath and wheezing plan C).    [provider]    Family History Family History  Problem Relation Age of Onset  . Lung disease Mother         pulm fibrosis  . Colitis Father   . Heart disease Brother   . Hypertension Brother   . Hyperlipidemia Brother   . CAD Daughter  cad  . Hypertension Son   . Colon cancer Neg Hx     Social History Social History   Tobacco Use  . Smoking status: Former Smoker    Packs/day: 1.00    Years: 60.00    Pack years: 60.00    Types: Cigarettes, E-cigarettes    Last attempt to quit: 04/18/2015    Years since quitting: 1.7  . Smokeless tobacco: Never Used  Substance Use Topics  . Alcohol use: Yes    Alcohol/week: 7.2 oz    Types: 12 Cans of beer per week  . Drug use: No     Allergies   Patient has no known allergies.   Review of Systems Review of Systems  Constitutional: Positive for fatigue. Negative for chills, diaphoresis and fever.  HENT: Negative for congestion, rhinorrhea and sneezing.   Eyes: Negative.   Respiratory: Positive for cough, shortness of breath and wheezing. Negative for chest tightness.   Cardiovascular: Negative for chest pain and leg swelling.  Gastrointestinal: Negative for abdominal pain, blood in stool, diarrhea, nausea and vomiting.  Genitourinary: Negative for difficulty urinating, flank pain, frequency and hematuria.  Musculoskeletal: Negative for arthralgias and back pain.  Skin: Negative for rash.  Neurological: Negative for dizziness, speech difficulty, weakness, numbness and headaches.     Physical Exam Updated Vital Signs BP 132/62   Pulse (!) 107   Temp 98.8 F (37.1 C) (Axillary)   Resp (!) 28   Ht 5\' 5"  (1.651 m)   Wt 68 kg (150 lb)   SpO2 95%   BMI 24.96 kg/m   Physical Exam  Constitutional: He is oriented to person, place, and time. He appears well-developed and well-nourished. He appears distressed.  HENT:  Head: Normocephalic and atraumatic.  Eyes: Pupils are equal, round, and reactive to light.  Neck: Normal range of motion. Neck supple.  Cardiovascular: Normal rate, regular rhythm and normal heart sounds.    Pulmonary/Chest: Accessory muscle usage present. Tachypnea noted. He is in respiratory distress. He has decreased breath sounds. He has wheezes. He has no rales. He exhibits no tenderness.  On CPAP  Abdominal: Soft. Bowel sounds are normal. There is no tenderness. There is no rebound and no guarding.  Musculoskeletal: Normal range of motion. He exhibits no edema.  Lymphadenopathy:    He has no cervical adenopathy.  Neurological: He is alert and oriented to person, place, and time.  Skin: Skin is warm and dry. No rash noted.  Psychiatric: He has a normal mood and affect.     ED Treatments / Results  Labs (all labs ordered are listed, but only abnormal results are displayed) Labs Reviewed  BASIC METABOLIC PANEL - Abnormal; Notable for the following components:      Result Value   Glucose, Bld 299 (*)    All other components within normal limits  CBC WITH DIFFERENTIAL/PLATELET - Abnormal; Notable for the following components:   WBC 16.8 (*)    Neutro Abs 11.2 (*)    Monocytes Absolute 1.5 (*)    All other components within normal limits  BRAIN NATRIURETIC PEPTIDE - Abnormal; Notable for the following components:   B Natriuretic Peptide 986.0 (*)    All other components within normal limits  TROPONIN I - Abnormal; Notable for the following components:   Troponin I 0.22 (*)    All other components within normal limits  HEMOGLOBIN A1C - Abnormal; Notable for the following components:   Hgb A1c MFr Bld 6.2 (*)    All  other components within normal limits  I-STAT ARTERIAL BLOOD GAS, ED - Abnormal; Notable for the following components:   pH, Arterial 7.315 (*)    pCO2 arterial 51.1 (*)    All other components within normal limits  TROPONIN I  TROPONIN I  URINALYSIS, COMPLETE (UACMP) WITH MICROSCOPIC  I-STAT TROPONIN, ED    EKG  EKG Interpretation  Date/Time:  Sunday January 31 2017 11:32:48 EST Ventricular Rate:  120 PR Interval:    QRS Duration: 127 QT  Interval:  339 QTC Calculation: 479 R Axis:   -21 Text Interpretation:  Sinus tachycardia IVCD, consider atypical RBBB LVH with secondary repolarization abnormality Borderline prolonged QT interval Confirmed by Malvin Johns [phone]) on 01/31/2017 11:42:23 AM       Radiology Dg Chest Port 1 View  Result Date: 01/31/2017 CLINICAL DATA:  Shortness of breath. EXAM: PORTABLE CHEST 1 VIEW COMPARISON:  August 26, 2016 chest radiograph and chest CT Jul 23, 2015 FINDINGS: Lungs are hyperexpanded. There is extensive fibrosis throughout the lungs. There is bullous disease in the upper lobes. In comparison with most recent prior study, there is increase in interstitial prominence in the lower lung zones, likely interstitial edema superimposed on fibrosis. There is no appreciable airspace consolidation. There are calcified pleural plaques, stable. There is also calcification along the right hemidiaphragm. Of heart is mildly enlarged. There appears to be a degree of relative pulmonary venous hypertension. There is aortic atherosclerosis. There is an aortic valve replacement. No adenopathy is evident. There is postoperative change involving a lower left rib. IMPRESSION: There is underlying emphysematous change with pulmonary fibrosis. There is increase in interstitial prominence, likely interstitial pulmonary edema superimposed on emphysema. Suspect congestive heart failure with underlying emphysema and fibrosis. There also calcified pleural plaques and calcification along the right hemidiaphragm indicative of prior asbestos exposure. There is aortic atherosclerosis. Patient is status post aortic valve replacement. Aortic Atherosclerosis (ICD10-I70.0). Electronically Signed   By: Lowella Grip III M.D.   On: 01/31/2017 10:30    Procedures Procedures (including critical care time)  Medications Ordered in ED Medications  atorvastatin (LIPITOR) tablet 40 mg (not administered)  bisoprolol (ZEBETA) tablet 2.5 mg  (not administered)  citalopram (CELEXA) tablet 20 mg (not administered)  famotidine (PEPCID) tablet 20 mg (not administered)  fenofibrate tablet 160 mg (not administered)  Iron TABS 325 mg (not administered)  temazepam (RESTORIL) capsule 7.5 mg (not administered)  LORazepam (ATIVAN) tablet 1 mg (not administered)    Or  LORazepam (ATIVAN) injection 1 mg (not administered)  thiamine (VITAMIN B-1) tablet 100 mg (not administered)    Or  thiamine (B-1) injection 100 mg (not administered)  folic acid (FOLVITE) tablet 1 mg (not administered)  multivitamin with minerals tablet 1 tablet (not administered)  LORazepam (ATIVAN) injection 0-4 mg (not administered)    Followed by  LORazepam (ATIVAN) injection 0-4 mg (not administered)  enoxaparin (LOVENOX) injection 40 mg (not administered)  acetaminophen (TYLENOL) tablet 650 mg (not administered)    Or  acetaminophen (TYLENOL) suppository 650 mg (not administered)  ondansetron (ZOFRAN) tablet 4 mg (not administered)    Or  ondansetron (ZOFRAN) injection 4 mg (not administered)  guaiFENesin (MUCINEX) 12 hr tablet 600 mg (not administered)  insulin aspart (novoLOG) injection 0-15 Units (not administered)  insulin aspart (novoLOG) injection 0-5 Units (not administered)  furosemide (LASIX) injection 80 mg (not administered)  methylPREDNISolone sodium succinate (SOLU-MEDROL) 125 mg/2 mL injection 60 mg (not administered)  docusate sodium (COLACE) capsule 100 mg (not administered)  ipratropium-albuterol (  DUONEB) 0.5-2.5 (3) MG/3ML nebulizer solution 3 mL (not administered)  levofloxacin (LEVAQUIN) tablet 500 mg (not administered)  albuterol (PROVENTIL) (2.5 MG/3ML) 0.083% nebulizer solution (  Given 01/31/17 0955)  sodium chloride 0.9 % bolus 500 mL (0 mLs Intravenous Stopped 01/31/17 1115)  LORazepam (ATIVAN) injection 1 mg (1 mg Intravenous Given 01/31/17 1039)  albuterol (PROVENTIL) (2.5 MG/3ML) 0.083% nebulizer solution (5 mg  Given 01/31/17  1138)  furosemide (LASIX) injection 80 mg (80 mg Intravenous Given 01/31/17 1152)     Initial Impression / Assessment and Plan / ED Course  I have reviewed the triage vital signs and the nursing notes.  Pertinent labs & imaging results that were available during my care of the patient were reviewed by me and considered in my medical decision making (see chart for details).     09:35 Pt with severe respiratory distress, on BIPAP.  Continuing with continuous nebs.  EKG show new EKG changes consistent with STEMI.  However, due to pt's respiratory status does not seem appropriate to transfer immediately to the cath lab.  I contacted Dr. Claiborne Billings who reviewed EKGs and agrees.  Request in house cardiology team to evaluate pt.    [id number]: cardiology paged  10:30 cardiology repaged.  Wife called, did say that pt is an alcoholic and drinks about a case of beer a day.  Will give pt a dose of ativan because his breathing seems to be improved, is talking better, but seems very anxious.  Dr Caryl Comes has seen the pt.  Will follow.  I consulted [facility] hospitalist who will admit the pt.  Had a brief trial off BIPAP, but did not do well and placed back on.  ABG looks ok.  Given IV lasix for CHF component.  CRITICAL CARE Performed by: Vernel Langenderfer Total critical care time: 60 minutes Critical care time was exclusive of separately billable procedures and treating other patients. Critical care was necessary to treat or prevent imminent or life-threatening deterioration. Critical care was time spent personally by me on the following activities: development of treatment plan with patient and/or surrogate as well as nursing, discussions with consultants, evaluation of patient's response to treatment, examination of patient, obtaining history from patient or surrogate, ordering and performing treatments and interventions, ordering and review of laboratory studies, ordering and review of radiographic studies, pulse oximetry  and re-evaluation of patient's condition.   Final Clinical Impressions(s) / ED Diagnoses   Final diagnoses:  COPD exacerbation ([place])  Acute congestive heart failure, unspecified heart failure type [facility]    ED Discharge Orders    None       Malvin Johns, MD 01/31/17 1324

## 2017-01-31 NOTE — ED Notes (Signed)
Wife called spoke with nurse and Doctor on phone.

## 2017-01-31 NOTE — H&P (Signed)
History and Physical    [name]:[id number] DOB: [birth year] DOA: 01/31/2017  PCP: Ann Held, DO Patient coming from: home  Chief Complaint: sob/cough  HPI: Ronald Bell is a pleasant 76 y.o. 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 Initial evaluation in the emergency department reveals acute on chronic respiratory failure related to COPD exacerbation setting of possible acute on chronic heart failure likely related to STEMI. [facility] asked to admit  Information is obtained from patient and the chart. He reports he was his usual state of health early this morning when he developed sudden onset worsening shortness of breath and all herbal wheezing. 40 at a brief episodes similar yesterday but uses nebs and inhalers and felt better. He denies chest pain palpitations diaphoresis nausea vomiting. He reports his symptoms feel similar to his prior COPD exacerbations. He does admit to some increased cough and sputum production. He denies headache dizziness syncope or near-syncope. He denies lower extremity edema. He reports he uses oxygen at home but only at night. He denies fever chills recent travel or sick contacts. He called EMS who reportedly provided him with nebulizers 125 mg of Solu-Medrol 2 g of magnesium and was placed on C Pap. Of note wife called hospital and reported patient drinks approximately one case of beer per day    ED Course: In the emergency department he is afebrile with hypertension and tachycardia as well as tachypnea. Oxygen saturation level 94% on BiPAP with a respiratory rate of 46. Is provided with 80 mg a Lasix and continuous nebs  Review of Systems: As  per HPI otherwise all other systems reviewed and are negative.   Ambulatory Status: Relates independently is independent with ADLs  Past Medical History:  Diagnosis Date  . Angiodysplasia of intestine with hemorrhage    large and SB, gastric AVMs.   . Anxiety   . Arthritis    "left shoulder" (10/19/2014)  . Carotid artery occlusion   . Carotid artery stenosis 04/22/2012  . COPD GOLD III with min reversibilty  08/11/2006   Followed in Pulmonary clinic/ [facility]/ [facility] - PFT's 04/28/2013  FEV1 0.88 (40%) with ratio 44 and 14% better p B2 dlco 45 corrects to 83 - Trial of breo 04/28/2013 > improved symptoms  06/09/2013  - spirometry 06/04/2014 FEV1  0.76 (29%) ratio 45      . Coronary artery disease   . GERD 11/30/2008   Qualifier: Diagnosis of  By: Marijean Niemann CMA, Danielle    . GERD (gastroesophageal reflux disease)   . GI bleed 2010   4 units PRBCs  . History of blood transfusion "couple times"   "related to bleeding in colon and esophagus"  . Hx of adenomatous colonic polyps 2012, 2013.   Marland Kitchen Hyperlipidemia   . Hypertension   . Iron deficiency anemia 01/25/2009   Qualifier: Diagnosis of  By: Henrene Pastor MD, Docia Chuck   . Irregular heartbeat   . On home oxygen therapy    "2L at night" (07/23/2015)  . Peripheral vascular disease ([place])   . Pleural plaque with presence of asbestos 03/27/2013   Followed in Pulmonary clinic/ [facility]/ [facility] - F/u CT 09/08/2013 1. Stable extensive calcified pleural plaque formation consistent with asbestos exposure. 2. Multiple pulmonary nodules are unchanged  from the CT of 6 months ago. Given risk factors for lung cancer, continued follow up is recommended with chest CT in 6 months> done 04/20/14 no change >repeat in 12 m in tickle file     . Polyp of nasal cavity   . PVD (peripheral vascular disease) ([place]) 10/18/2012  . Rosacea   . 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 Shingles   . Tobacco abuse     Past Surgical History:  Procedure Laterality Date  . CARDIAC CATHETERIZATION  2001; 06/28/2014  . COLONOSCOPY  July 2015   Dr. Henrene Pastor  . ESOPHAGOGASTRODUODENOSCOPY  2012   normal  . ILIAC ARTERY STENT Left 2005   CIA  . TONSILLECTOMY      Social History   Socioeconomic History  . Marital status: Married    Spouse name: Not on file  . Number of children: 4  . Years of education: Not on file  . Highest education level: Not on file  Social Needs  . Financial resource strain: Not on file  . Food insecurity - worry: Not on file  . Food insecurity - inability: Not on file  . Transportation needs - medical: Not on file  . Transportation needs - non-medical: Not on file  Occupational History  . Occupation: Oncologist: [employer]  Tobacco Use  . Smoking status: Former Smoker    Packs/day: 1.00    Years: 60.00    Pack years: 60.00    Types: Cigarettes, E-cigarettes    Last attempt to quit: 04/18/2015    Years since quitting: 1.7  . Smokeless tobacco: Never Used  Substance and Sexual Activity  . Alcohol use: Yes    Alcohol/week: 7.2 oz    Types: 12 Cans of beer per week  . Drug use: No  . Sexual activity: No    Partners: Female  Other Topics Concern  . Not on file  Social History Narrative  . Not on file    No Known Allergies  Family History  Problem Relation Age of Onset  . Lung disease Mother        pulm fibrosis  . Colitis Father   . Heart disease Brother   . Hypertension Brother   . Hyperlipidemia Brother   . CAD Daughter        cad  . Hypertension Son   . Colon cancer Neg Hx     Prior to Admission medications   Medication Sig Start Date End Date Taking? Authorizing Provider  albuterol (PROVENTIL HFA;VENTOLIN HFA) 108 (90 Base) MCG/ACT inhaler Inhale 2 puffs into the lungs every 6 (six) hours as needed for wheezing or shortness of breath (Plan B). 12/08/15   Carollee Herter, Alferd Apa,  DO  ALPRAZolam Duanne Moron) 0.5 MG tablet TAKE 1 TABLET BY MOUTH THREE TIMES DAILY AS NEEDED FOR ANXIETY 01/12/17   Carollee Herter, Alferd Apa, DO  aspirin 81 MG tablet Take 81 mg by mouth every morning.     [provider]  atorvastatin (LIPITOR) 40 MG tablet Take 1 tablet (40 mg total) by mouth daily. 11/24/16   Roma Schanz R, DO  atorvastatin (LIPITOR) 40 MG tablet TAKE 1 TABLET BY MOUTH ONCE DAILY 12/03/16   Carollee Herter, Alferd Apa, DO  bisoprolol (ZEBETA) 5 MG tablet Take 0.5 tablets (2.5 mg total) by mouth daily. 06/16/16   Jerline Pain, MD  Garlon Hatchet  15 MCG/2ML NEBU USE 1 VIAL IN NEBULIZER TWICE DAILY 11/16/16   Tanda Rockers, MD  budesonide (PULMICORT) 0.5 MG/2ML nebulizer solution USE ONE VIAL (2ML) IN NEBULIZER TWICE DAILY 09/01/16   Carollee Herter, Alferd Apa, DO  citalopram (CELEXA) 20 MG tablet Take 1 tablet (20 mg total) by mouth daily. 11/24/16   Ann Held, DO  docusate sodium (COLACE) 100 MG capsule Take 100-200 mg by mouth See admin instructions. 1 capsule by mouth in the morning and 2 at bedtime     [provider]  famotidine (PEPCID) 20 MG tablet Take 20 mg by mouth at bedtime.     [provider]  fenofibrate 160 MG tablet Take 1 tablet (160 mg total) by mouth daily. 11/24/16   Roma Schanz R, DO  Ferrous Sulfate (IRON) 325 (65 Fe) MG TABS TAKE ONE TABLET BY MOUTH TWICE DAILY 01/21/17   Irene Shipper, MD  ferrous sulfate 325 (65 FE) MG EC tablet Take 1 tablet (325 mg total) by mouth 2 (two) times daily. 11/24/16   Ann Held, DO  furosemide (LASIX) 20 MG tablet Take 20 mg by mouth daily.    [provider]  guaiFENesin (MUCINEX) 600 MG 12 hr tablet Take 1 tablet (600 mg total) by mouth 2 (two) times daily. 09/08/16 09/08/17  Dhungel, Nishant, MD  ipratropium-albuterol (DUONEB) 0.5-2.5 (3) MG/3ML SOLN Take 3 mLs by nebulization every 4 (four) hours as needed (shortness of breath and wheezing plan C).    [provider]    Multiple Vitamins-Minerals (CENTRUM SILVER PO) Take 1 tablet by mouth daily.     [provider]  OXYGEN Use 2L at bedtime and as needed for shortness of breath    [provider]  pantoprazole (PROTONIX) 40 MG tablet Take 1 tablet (40 mg total) by mouth daily. 11/24/16   Ann Held, DO  predniSONE (DELTASONE) 20 MG tablet Take 1 tablet (20 mg total) by mouth daily with breakfast. 09/08/16   Dhungel, Flonnie Overman, MD  sodium chloride (OCEAN) 0.65 % SOLN nasal spray Place 1-2 sprays into both nostrils as needed for congestion.     [provider]  temazepam (RESTORIL) 30 MG capsule TAKE 1 CAPSULE BY MOUTH AT BEDTIME AS NEEDED FOR SLEEP 12/07/16   Carollee Herter, Alferd Apa, DO  umeclidinium bromide (INCRUSE ELLIPTA) 62.5 MCG/INH AEPB Inhale 1 puff into the lungs daily. Patient not taking: Reported on 11/24/2016 11/10/16   Tanda Rockers, MD  vitamin C (ASCORBIC ACID) 500 MG tablet Take 500 mg by mouth daily.    [provider]    Physical Exam: Vitals:   01/31/17 1138 01/31/17 1145 01/31/17 1200 01/31/17 1215  BP: (!) 133/116 (!) 141/117 117/70 132/62  Pulse: (!) 125 (!) 128 (!) 121 (!) 107  Resp: (!) 46 (!) 33 (!) 33 (!) 28  Temp:      TempSrc:      SpO2: 94% 94% 95% 95%  Weight:      Height:         General:  Appears right uncomfortable sitting straight up in bed gripping both side rails and moderate respiratory distress on Bipap Eyes:  PERRL, EOMI, normal lids, iris ENT:  grossly normal hearing, lips & tongue, mucous membranes of his mouth are moist and pink Neck:  no LAD, masses or thyromegaly Cardiovascular:  Tachycardia regular, no m/r/g. No LE edema.  Respiratory:  CTA bilaterally, no w/r/r. Moderate increased work of breathing  on bipap. Using abdominal accessory muscles. Unable to complete sentences. Don't hear any air moving on auscultation Abdomen:  soft, ntnd, obese positive bowel sounds no guarding or rebounding Skin:  no rash or induration  seen on limited exam Musculoskeletal:  grossly normal tone BUE/BLE, good ROM, no bony abnormality Psychiatric:  grossly normal mood and affect, speech fluent and appropriate, AOx3 Neurologic:  CN 2-12 grossly intact, moves all extremities in coordinated fashion, sensation intact  Labs on Admission: I have personally reviewed following labs and imaging studies  CBC: Recent Labs  Lab 01/31/17 0941  WBC 16.8*  NEUTROABS 11.2*  HGB 13.8  HCT 43.0  MCV 99.3  PLT 242   Basic Metabolic Panel: Recent Labs  Lab 01/31/17 0941  NA 136  K 4.3  CL 102  CO2 25  GLUCOSE 299*  BUN 11  CREATININE 1.07  CALCIUM 9.1   GFR: Estimated Creatinine Clearance: 49.5 mL/min (by C-G formula based on SCr of 1.07 mg/dL). Liver Function Tests: No results for input(s): AST, ALT, ALKPHOS, BILITOT, PROT, ALBUMIN in the last 168 hours. No results for input(s): LIPASE, AMYLASE in the last 168 hours. No results for input(s): AMMONIA in the last 168 hours. Coagulation Profile: No results for input(s): INR, PROTIME in the last 168 hours. Cardiac Enzymes: No results for input(s): CKTOTAL, CKMB, CKMBINDEX, TROPONINI in the last 168 hours. BNP (last 3 results) No results for input(s): PROBNP in the last 8760 hours. HbA1C: No results for input(s): HGBA1C in the last 72 hours. CBG: No results for input(s): GLUCAP in the last 168 hours. Lipid Profile: No results for input(s): CHOL, HDL, LDLCALC, TRIG, CHOLHDL, LDLDIRECT in the last 72 hours. Thyroid Function Tests: No results for input(s): TSH, T4TOTAL, FREET4, T3FREE, THYROIDAB in the last 72 hours. Anemia Panel: No results for input(s): VITAMINB12, FOLATE, FERRITIN, TIBC, IRON, RETICCTPCT in the last 72 hours. Urine analysis:    Component Value Date/Time   COLORURINE YELLOW 11/16/2014 1058   APPEARANCEUR CLEAR 11/16/2014 1058   LABSPEC 1.015 11/16/2014 1058   PHURINE 6.5 11/16/2014 1058   GLUCOSEU NEGATIVE 11/16/2014 1058   HGBUR NEGATIVE  11/16/2014 1058   HGBUR negative 11/30/2008 0000   BILIRUBINUR neg 05/09/2015 1106   KETONESUR NEGATIVE 11/16/2014 1058   PROTEINUR neg 05/09/2015 1106   PROTEINUR NEGATIVE 11/16/2014 1058   UROBILINOGEN 0.2 05/09/2015 1106   UROBILINOGEN 0.2 11/16/2014 1058   NITRITE neg 05/09/2015 1106   NITRITE NEGATIVE 11/16/2014 1058   LEUKOCYTESUR Negative 05/09/2015 1106    Creatinine Clearance: Estimated Creatinine Clearance: 49.5 mL/min (by C-G formula based on SCr of 1.07 mg/dL).  Sepsis Labs: @LABRCNTIP (procalcitonin:4,lacticidven:4) )No results found for this or any previous visit (from the past 240 hour(s)).   Radiological Exams on Admission: Dg Chest Port 1 View  Result Date: 01/31/2017 CLINICAL DATA:  Shortness of breath. EXAM: PORTABLE CHEST 1 VIEW COMPARISON:  August 26, 2016 chest radiograph and chest CT Jul 23, 2015 FINDINGS: Lungs are hyperexpanded. There is extensive fibrosis throughout the lungs. There is bullous disease in the upper lobes. In comparison with most recent prior study, there is increase in interstitial prominence in the lower lung zones, likely interstitial edema superimposed on fibrosis. There is no appreciable airspace consolidation. There are calcified pleural plaques, stable. There is also calcification along the right hemidiaphragm. Of heart is mildly enlarged. There appears to be a degree of relative pulmonary venous hypertension. There is aortic atherosclerosis. There is an aortic valve replacement. No adenopathy is evident. There is postoperative change  involving a lower left rib. IMPRESSION: There is underlying emphysematous change with pulmonary fibrosis. There is increase in interstitial prominence, likely interstitial pulmonary edema superimposed on emphysema. Suspect congestive heart failure with underlying emphysema and fibrosis. There also calcified pleural plaques and calcification along the right hemidiaphragm indicative of prior asbestos exposure. There is  aortic atherosclerosis. Patient is status post aortic valve replacement. Aortic Atherosclerosis (ICD10-I70.0). Electronically Signed   By: Lowella Grip III M.D.   On: 01/31/2017 10:30    EKG: Independently reviewed. Sinus tachycardia IVCD, consider atypical RBBB LVH with secondary repolarization abnormality Borderline prolonged QT interval  Assessment/Plan Principal Problem:   Acute on chronic respiratory failure (HCC) Active Problems:   ANEMIA, SECONDARY TO BLOOD LOSS   Essential hypertension   GERD   Carotid artery stenosis   Aortic stenosis   Paroxysmal atrial fibrillation (HCC)   Acute on chronic diastolic HF (heart failure) (HCC)   Acute exacerbation of chronic obstructive pulmonary disease (COPD) (HCC)   ETOH abuse   Hyperglycemia   Abnormal EKG   #1. Acute on chronic respiratory failure with hypoxia likely related to COPD exacerbation in the setting of chronic pulmonary fibrosis as well as acute on chronic diastolic heart failure and possible STEMI. patient wears oxygen at night only at home. ABG with a pH of 7.31 PCO2 51 PO2 of 91 on BiPAP. Chest x-ray with increase in interstitial prominence likely edema superimposed on emphysema. Concern for congestive heart failure with underlying emphysema and fibrosis. BNP 689 with tachypnea on BiPAP. He is provided with 80 mg of Lasix IV continuous nebs and Solu-Medrol. At the time of admission he continues with moderate increased work of breathing decreased air movement -Admit to step down -Continue BiPAP -Scheduled nebulizers -Solu-Medrol -IV Lasix -Pulmonary/critical care consult  #2. Abnormal EKG. Patient with a history of aortic stenosis that is post valve replacement as well as cad s/p stents. Also has a history of atrial fibrillation but not on anticoagulation because of a history of GI bleed. Initial troponin 2.95 chronic diastolic heart failure. Is evaluated by cardiology in the emergency department who at his atypical  elevated BNP suggests a component of congestive heart failure contributing to his symptoms. cardiology note does indicate ECG non-acute recommends IV diuretics. -See above -Cycle troponin -Serial EKG -Lasix 80 mg twice a day -Appreciate cardiology input.  #3. Acute on chronic diastolic heart failure. May be secondary to STEMI. Initial troponin negative EKG nonacute per cardiology echo done 5 months ago reveals an EF of 62% grade 1 diastolic dysfunction. -Daily weights -Intake and output -Lasix 80 mg IV twice a day -Continue blocker -Monitor  #4. EtOH abuse. Reportedly patient drinks one case of beer per day. -CIWA protocol -ciwa with scheduled ativan per protocol as well as prn, will monitor closely  #5. Hypertension. Improved control on BiPAP. -Continue home meds -Monitor   #6.Hyperglycemia. Likely related to chronic steroid use. Serum glucose 299 on admission -Hemoglobin A1c -Sliding scale insulin for optimal control    DVT prophylaxis: lovenox  Code Status: full  Family Communication: none present  Disposition Plan: home   Consults called: dr Arnaldo Natal and Lequita Halt card  Admission status: inpatient    Radene Gunning MD [facility]  If 7PM-7AM, please contact night-coverage [email or web address] Password [email or web address]  01/31/2017, 12:45 PM

## 2017-01-31 NOTE — ED Notes (Signed)
Wife called spoke with nurse regarding patient care.  Patient spoke with wife via nurse and he his feeling better.

## 2017-01-31 NOTE — Progress Notes (Signed)
Pt transported from ED to [place] on Bipap. No complications noted. Pt is alert & oriented. Sp02 99% upon arrival to floor. RT Mickel Baas given report on pt.

## 2017-01-31 NOTE — ED Notes (Signed)
Spoke with pharmacy regarding tubing Levaquin. Stated will be sending medication via tube station shortly.

## 2017-01-31 NOTE — ED Notes (Signed)
Doctor, nurse, and RT at bedside attempted neb patient did not tolerated only neb shortness of breath was worse without BiPap. BiPap place back on patient.

## 2017-01-31 NOTE — ED Notes (Signed)
Admitting provider at bedside.

## 2017-01-31 NOTE — ED Notes (Signed)
Spoke with Cardiologist  Dr Erick Alley notified of troponin 0.22.

## 2017-01-31 NOTE — ED Triage Notes (Signed)
Arrived by EMS for shortness of breath. Patient placed on Cpap and EMS administered 10 mg albuterol, 0.5 atrovent, 2 g magnesium, and solumedrol 125 mg IVP. Patient alert answering and following commands appropriate continued to have shortness of breath. Doctor, nurse, and RT at bedside at time of arrival.

## 2017-01-31 NOTE — ED Notes (Signed)
Doctor notified manual BP 140 /110

## 2017-01-31 NOTE — Progress Notes (Signed)
Pt tried off Bipap.  Pt had increased WOB.  Pt placed back on Bipap.

## 2017-02-01 ENCOUNTER — Encounter: Payer: Medicare Other | Admitting: Adult Health

## 2017-02-01 ENCOUNTER — Inpatient Hospital Stay (HOSPITAL_COMMUNITY): Payer: Medicare Other

## 2017-02-01 DIAGNOSIS — R0902 Hypoxemia: Secondary | ICD-10-CM

## 2017-02-01 DIAGNOSIS — J810 Acute pulmonary edema: Secondary | ICD-10-CM

## 2017-02-01 DIAGNOSIS — I509 Heart failure, unspecified: Secondary | ICD-10-CM

## 2017-02-01 LAB — COMPREHENSIVE METABOLIC PANEL
ALT: 35 U/L (ref 17–63)
ANION GAP: 11 (ref 5–15)
AST: 60 U/L — ABNORMAL HIGH (ref 15–41)
Albumin: 3.7 g/dL (ref 3.5–5.0)
Alkaline Phosphatase: 27 U/L — ABNORMAL LOW (ref 38–126)
BILIRUBIN TOTAL: 0.8 mg/dL (ref 0.3–1.2)
BUN: 16 mg/dL (ref 6–20)
CHLORIDE: 101 mmol/L (ref 101–111)
CO2: 27 mmol/L (ref 22–32)
Calcium: 9.2 mg/dL (ref 8.9–10.3)
Creatinine, Ser: 1.22 mg/dL (ref 0.61–1.24)
GFR, EST NON AFRICAN AMERICAN: 55 mL/min — AB (ref >60)
Glucose, Bld: 144 mg/dL — ABNORMAL HIGH (ref 65–99)
POTASSIUM: 3.8 mmol/L (ref 3.5–5.1)
Sodium: 139 mmol/L (ref 135–145)
TOTAL PROTEIN: 7 g/dL (ref 6.5–8.1)

## 2017-02-01 LAB — PROTIME-INR
INR: 1.19
Prothrombin Time: 15 seconds (ref 11.4–15.2)

## 2017-02-01 LAB — GLUCOSE, CAPILLARY
Glucose-Capillary: 140 mg/dL — ABNORMAL HIGH (ref 65–99)
Glucose-Capillary: 100 mg/dL — ABNORMAL HIGH (ref 65–99)
Glucose-Capillary: 165 mg/dL — ABNORMAL HIGH (ref 65–99)

## 2017-02-01 LAB — CBC
HEMATOCRIT: 39.2 % (ref 39.0–52.0)
Hemoglobin: 13 g/dL (ref 13.0–17.0)
MCH: 32.5 pg (ref 26.0–34.0)
MCHC: 33.2 g/dL (ref 30.0–36.0)
MCV: 98 fL (ref 78.0–100.0)
Platelets: 160 10*3/uL (ref 150–400)
RBC: 4 MIL/uL — AB (ref 4.22–5.81)
RDW: 14 % (ref 11.5–15.5)
WBC: 13.8 10*3/uL — AB (ref 4.0–10.5)

## 2017-02-01 LAB — TROPONIN I: TROPONIN I: 6.04 ng/mL — AB (ref <0.03)

## 2017-02-01 MED ORDER — POTASSIUM CHLORIDE CRYS ER 20 MEQ PO TBCR
40.0000 meq | EXTENDED_RELEASE_TABLET | Freq: Two times a day (BID) | ORAL | Status: DC
  Administered 2017-02-01 – 2017-02-08 (×15): 40 meq via ORAL
  Filled 2017-02-01 (×15): qty 2

## 2017-02-01 NOTE — Plan of Care (Signed)
  No Outcome Acute Rehab PT Goals(only PT should resolve) Pt Will Go Supine/Side To Sit 02/01/2017 1204 by Taffy Delconte, [place] Taken 02/01/2017 1204  Pt will go Supine/Side to Sit with supervision Patient Will Transfer Sit To/From Stand 02/01/2017 1204 by Annaliah Rivenbark, [place] Taken 02/01/2017 1204  Patient will transfer sit to/from stand with modified independence Pt Will Transfer Bed To Chair/Chair To Bed 02/01/2017 1204 by Donn Zanetti, Tessie Fass, PT Flowsheets Taken 02/01/2017 1204  Pt will Transfer Bed to Chair/Chair to Bed with modified independence Pt Will Ambulate 02/01/2017 1204 by Marcine Gadway, [place] Taken 02/01/2017 1204  Pt will Ambulate 100 feet;with supervision Pt Will Go Up/Down Stairs 02/01/2017 1204 by Cliford Sequeira, Tessie Fass, PT Flowsheets Taken 02/01/2017 1204  Pt will Go Up / Down Stairs 1-2 stairs;with supervision;with rail(s)

## 2017-02-01 NOTE — Progress Notes (Signed)
PROGRESS NOTE    Ronald Bell  NWG:[id number] DOB: [birth year] DOA: 01/31/2017 PCP: Ann Held, DO   Brief Narrative: 76 y.o. 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 Initial evaluation in the emergency department reveals acute on chronic respiratory failure related to COPD exacerbation setting of possible acute on chronic heart failure likely related to STEMI. [facility] hospitalist asked to admit  Information is obtained from patient and the chart. He reports he was his usual state of health early this morning when he developed sudden onset worsening shortness of breath and all herbal wheezing. 40 at a brief episodes similar yesterday but uses nebs and inhalers and felt better. He denies chest pain palpitations diaphoresis nausea vomiting. He reports his symptoms feel similar to his prior COPD exacerbations. He does admit to some increased cough and sputum production. He denies headache dizziness syncope or near-syncope. He denies lower extremity edema. He reports he uses oxygen at home but only at night. He denies fever chills recent travel or sick contacts. He called EMS who reportedly provided him with nebulizers 125 mg of Solu-Medrol 2 g of magnesium and was placed on C Pap. Of note wife called hospital and reported patient drinks approximately one case of beer per day  ED Course: In the emergency department he is afebrile with hypertension and tachycardia as well as tachypnea. Oxygen saturation level 94% on BiPAP with a respiratory rate of 46. Is provided with 80 mg a Lasix and continuous nebs  Assessment & Plan:   Principal Problem:   Acute on chronic respiratory failure (HCC) Active Problems:  ANEMIA, SECONDARY TO BLOOD LOSS   Essential hypertension   GERD   Carotid artery stenosis   Aortic stenosis   Paroxysmal atrial fibrillation (HCC)   Acute on chronic diastolic HF (heart failure) (HCC)   Acute exacerbation of chronic obstructive pulmonary disease (COPD) (HCC)   ETOH abuse   Hyperglycemia   Abnormal EKG  Acute on chronic respiratory failure with hypoxia and hypercapnia secondary to COPD exacerbation and chronic pulmonary fibrosis plus acute on chronic diastolic heart failure-patient does have oxygen at home 24 hours a day.  He is also steroid dependent for COPD and pulmonary fibrosis.  At the time of admission his BNP was 689 and he was put on BiPAP.  Today he is awake alert off the BiPAP and he reports that he is feeling much better than yesterday.  He is followed by cardiology at this time.  We will continue his IV Solu-Medrol Lasix and scheduled nebulizers.  BiPAP standby.  Aortic stenosis status post aortic valve replacement not on anticoagulation because of history of GI bleed.  He has elevated troponin peaked at 6.34.  With no acute EKG changes.  Cardiology following.  Patient had a limited echocardiogram the results are pending at this time.  History of CAD and stent placement.  EtOH abuse on Siva protocol.  Patient very tachycardic and tachypneic at this time discussed with the nurse to give him a dose of lorazepam.  HTN patient currently on not any medications at this time blood pressure stable.   DVT prophylaxis Lovenox Code Status: Full code Family Communication none Disposition Plan: TBD Consultants:  Cardiology  Procedures: None Antimicrobials: Levaquin  subjective: Feels better than yesterday.  But still  feels short of breath.  Objective: Patient sitting up in bed trying to eat breakfast.  He speaks in full sentences but he appears to be tachypneic and tachycardic at this time. Vitals:   02/01/17 0426 02/01/17 0551 02/01/17 0711 02/01/17 0725  BP:   105/68  (!) 100/58  Pulse:   90 89  Resp:   (!) 21 18  Temp:    (!) 97.5 F (36.4 C)  TempSrc:    Oral  SpO2: 98%  96% 98%  Weight:      Height:        Intake/Output Summary (Last 24 hours) at 02/01/2017 1047 Last data filed at 02/01/2017 0935 Gross per 24 hour  Intake 240 ml  Output 1775 ml  Net -1535 ml   Filed Weights   01/31/17 0958 01/31/17 1546 02/01/17 0343  Weight: 68 kg (150 lb) 67.9 kg (149 lb 11.1 oz) 67 kg (147 lb 12.8 oz)    Examination:  General exam: Appears calm and comfortable  Respiratory systemrhonchi b/l Respiratory effort normal. Cardiovascular system: S1 & S2 heard, RRR. No JVD, murmurs, rubs, gallops or clicks. No pedal edema. Gastrointestinal system: Abdomen is nondistended, soft and nontender. No organomegaly or masses felt. Normal bowel sounds heard. Central nervous system: Alert and oriented. No focal neurological deficits. Extremities: Symmetric 5 x 5 power. Skin: No rashes, lesions or ulcers Psychiatry: Judgement and insight appear normal. Mood & affect appropriate.     Data Reviewed: I have personally reviewed following labs and imaging studies  CBC: Recent Labs  Lab 01/31/17 0941 02/01/17 0009  WBC 16.8* 13.8*  NEUTROABS 11.2*  --   HGB 13.8 13.0  HCT 43.0 39.2  MCV 99.3 98.0  PLT 213 269   Basic Metabolic Panel: Recent Labs  Lab 01/31/17 0941 02/01/17 0009  NA 136 139  K 4.3 3.8  CL 102 101  CO2 25 27  GLUCOSE 299* 144*  BUN 11 16  CREATININE 1.07 1.22  CALCIUM 9.1 9.2   GFR: Estimated Creatinine Clearance: 42.1 mL/min (by C-G formula based on SCr of 1.22 mg/dL). Liver Function Tests: Recent Labs  Lab 02/01/17 0009  AST 60*  ALT 35  ALKPHOS 27*  BILITOT 0.8  PROT 7.0  ALBUMIN 3.7   No results for input(s): LIPASE, AMYLASE in the last 168 hours. No results for input(s): AMMONIA in the last 168 hours. Coagulation Profile: Recent Labs  Lab 02/01/17 0009  INR 1.19   Cardiac Enzymes: Recent Labs  Lab  01/31/17 1208 01/31/17 1822 02/01/17 0009  TROPONINI 0.22* 6.34* 6.04*   BNP (last 3 results) No results for input(s): PROBNP in the last 8760 hours. HbA1C: Recent Labs    01/31/17 1200  HGBA1C 6.2*   CBG: Recent Labs  Lab 01/31/17 1550 01/31/17 1939 02/01/17 0725  GLUCAP 184* 150* 140*   Lipid Profile: No results for input(s): CHOL, HDL, LDLCALC, TRIG, CHOLHDL, LDLDIRECT in the last 72 hours. Thyroid Function Tests: No results for input(s): TSH, T4TOTAL, FREET4, T3FREE, THYROIDAB in the last 72 hours. Anemia Panel: No results for input(s): VITAMINB12, FOLATE, FERRITIN, TIBC, IRON, RETICCTPCT in the last 72 hours. Sepsis Labs: No results for input(s): PROCALCITON, LATICACIDVEN in the last 168 hours.  Recent Results (from the past 240 hour(s))  MRSA PCR Screening     Status: None   Collection Time: 01/31/17  3:44 PM  Result Value Ref Range Status   MRSA by PCR NEGATIVE NEGATIVE Final    Comment:  The GeneXpert MRSA Assay (FDA approved for NASAL specimens only), is one component of a comprehensive MRSA colonization surveillance program. It is not intended to diagnose MRSA infection nor to guide or monitor treatment for MRSA infections.          Radiology Studies: Dg Chest Port 1 View  Result Date: 02/01/2017 CLINICAL DATA:  Increasing shortness of breath with cough. EXAM: PORTABLE CHEST 1 VIEW COMPARISON:  01/31/2017. FINDINGS: Stable enlarged cardiomediastinal silhouette. Emphysematous change with pulmonary fibrosis, stable. Improved interstitial edema. No consolidation or pneumothorax. Previous ORIF LEFT rib fracture. Stable pleural calcifications along the diaphragm. IMPRESSION: Improved interstitial edema.  No consolidation or pneumothorax. Electronically Signed   By: Staci Righter M.D.   On: 02/01/2017 08:53   Dg Chest Port 1 View  Result Date: 01/31/2017 CLINICAL DATA:  Shortness of breath. EXAM: PORTABLE CHEST 1 VIEW COMPARISON:  August 26, 2016  chest radiograph and chest CT Jul 23, 2015 FINDINGS: Lungs are hyperexpanded. There is extensive fibrosis throughout the lungs. There is bullous disease in the upper lobes. In comparison with most recent prior study, there is increase in interstitial prominence in the lower lung zones, likely interstitial edema superimposed on fibrosis. There is no appreciable airspace consolidation. There are calcified pleural plaques, stable. There is also calcification along the right hemidiaphragm. Of heart is mildly enlarged. There appears to be a degree of relative pulmonary venous hypertension. There is aortic atherosclerosis. There is an aortic valve replacement. No adenopathy is evident. There is postoperative change involving a lower left rib. IMPRESSION: There is underlying emphysematous change with pulmonary fibrosis. There is increase in interstitial prominence, likely interstitial pulmonary edema superimposed on emphysema. Suspect congestive heart failure with underlying emphysema and fibrosis. There also calcified pleural plaques and calcification along the right hemidiaphragm indicative of prior asbestos exposure. There is aortic atherosclerosis. Patient is status post aortic valve replacement. Aortic Atherosclerosis (ICD10-I70.0). Electronically Signed   By: Lowella Grip III M.D.   On: 01/31/2017 10:30        Scheduled Meds: . atorvastatin  40 mg Oral q1800  . bisoprolol  2.5 mg Oral Daily  . citalopram  20 mg Oral Daily  . docusate sodium  100 mg Oral BID  . enoxaparin (LOVENOX) injection  40 mg Subcutaneous Q24H  . famotidine  20 mg Oral QHS  . fenofibrate  160 mg Oral Q supper  . ferrous sulfate  325 mg Oral BID  . folic acid  1 mg Oral Daily  . furosemide  80 mg Intravenous BID  . guaiFENesin  600 mg Oral BID  . insulin aspart  0-15 Units Subcutaneous TID WC  . insulin aspart  0-5 Units Subcutaneous QHS  . ipratropium-albuterol  3 mL Nebulization Q4H  . levofloxacin  500 mg Oral Daily    . LORazepam  0-4 mg Intravenous Q6H   Followed by  . [START ON 02/02/2017] LORazepam  0-4 mg Intravenous Q12H  . methylPREDNISolone (SOLU-MEDROL) injection  40 mg Intravenous Q12H  . multivitamin with minerals  1 tablet Oral Daily  . thiamine  100 mg Oral Daily   Continuous Infusions:   LOS: 1 day     Georgette Shell, MD  If 7PM-7AM, please contact night-coverage [email or web address] Password [facility] 02/01/2017, 10:47 AM

## 2017-02-01 NOTE — Evaluation (Addendum)
Occupational Therapy Evaluation Patient Details Name: Ronald Bell MRN: [id number] DOB: 05/06/1940 Today's Date: 02/01/2017    History of Present Illness Pt is a 76 y.o. male who presented to the ED with wheeing and significant worsening of shortness of breath. 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    Clinical Impression   PTA, pt reports independence with basic ADL and functional mobility. He currently requires min guard assist for LB ADL and toilet transfers demonstrating significant shortness of breath with minimal exertion during ADL tasks. He was able to ambulate to the sink for standing grooming tasks with overall min guard assistance. Pt on 4L O2 via [place] throughout session with SpO2 89-100% overall during session and 93-94% during standing tasks. However, pt with RR increase to 40 respirations per minute with standing grooming tasks and improved to 25 with seated rest break. Feel pt would benefit from continued OT services while admitted to improve independence with ADL and functional mobility prior to returning home with home health OT follow-up and assistance from his wife.      Follow Up Recommendations  Home health OT;Supervision/Assistance - 24 hour    Equipment Recommendations  3 in 1 bedside commode    Recommendations for Other Services       Precautions / Restrictions Precautions Precautions: Fall Precaution Comments: Watch O2 Restrictions Weight Bearing Restrictions: No      Mobility Bed Mobility Overal bed mobility: Needs Assistance Bed Mobility: Supine to Sit;Sit to Supine     Supine to sit: HOB elevated;Min guard Sit to supine: Min guard;HOB elevated   General bed mobility comments: Min guard assist for safety.   Transfers Overall transfer level: Needs assistance Equipment  used: None Transfers: Sit to/from Stand Sit to Stand: Min guard         General transfer comment: Min guard assist for safety.     Balance Overall balance assessment: Needs assistance Sitting-balance support: No upper extremity supported;Feet supported Sitting balance-Leahy Scale: Fair     Standing balance support: No upper extremity supported;During functional activity Standing balance-Leahy Scale: Fair Standing balance comment: Min guard assist for dynamic standing.                            ADL either performed or assessed with clinical judgement   ADL Overall ADL's : Needs assistance/impaired Eating/Feeding: Set up;Sitting   Grooming: Min guard;Standing   Upper Body Bathing: Set up;Sitting   Lower Body Bathing: Min guard;Sit to/from stand   Upper Body Dressing : Set up;Sitting   Lower Body Dressing: Min guard;Sit to/from stand   Toilet Transfer: Min guard;Ambulation   Toileting- Clothing Manipulation and Hygiene: Min guard;Sit to/from stand       Functional mobility during ADLs: Min guard General ADL Comments: Pt limited to short periods of activity due to increased work of breathing. Pt on 4L O2 throughout session via  with SpO2 89-100% throughout; however, RR up to 40 with mobility to stand at sink for grooming tasks.      Vision Baseline Vision/History: Wears glasses Additional Comments: Will continue to assess. No deficits noted this date.      Perception     Praxis      Pertinent Vitals/Pain Pain Assessment: No/denies pain     Hand Dominance     Extremity/Trunk Assessment  Upper Extremity Assessment Upper Extremity Assessment: Generalized weakness(B UE tremor as well)   Lower Extremity Assessment Lower Extremity Assessment: Defer to PT evaluation       Communication Communication Communication: No difficulties   Cognition Arousal/Alertness: Awake/alert Behavior During Therapy: WFL for tasks assessed/performed Overall  Cognitive Status: No family/caregiver present to determine baseline cognitive functioning                                 General Comments: Pt impulsive and noted decreased short-term memory.    General Comments       Exercises     Shoulder Instructions      Home Living Family/patient expects to be discharged to:: Private residence Living Arrangements: Spouse/significant other Available Help at Discharge: Family;Available 24 hours/day Type of Home: Other(Comment)(condo) Home Access: Stairs to enter Entrance Stairs-Number of Steps: 2   Home Layout: One level     Bathroom Shower/Tub: Teacher, early years/pre: Standard     Home Equipment: Environmental consultant - 2 wheels;Cane - single point;Shower seat          Prior Functioning/Environment Level of Independence: Independent        Comments: Independent with basic ADL. Does drive but has not been driving much recently. Wife does shopping, etc.         OT Problem List: Decreased strength;Decreased activity tolerance;Impaired balance (sitting and/or standing);Decreased safety awareness;Decreased knowledge of use of DME or AE;Decreased knowledge of precautions;Pain;Decreased coordination      OT Treatment/Interventions: Self-care/ADL training;Therapeutic exercise;Energy conservation;DME and/or AE instruction;Patient/family education;Balance training;Therapeutic activities    OT Goals(Current goals can be found in the care plan section) Acute Rehab OT Goals Patient Stated Goal: to be able to breathe better OT Goal Formulation: With patient Time For Goal Achievement: 02/15/17 Potential to Achieve Goals: Good ADL Goals Pt Will Perform Grooming: with modified independence;standing Pt Will Transfer to Toilet: with modified independence;ambulating Pt Will Perform Toileting - Clothing Manipulation and hygiene: with modified independence;sit to/from stand Pt Will Perform Tub/Shower Transfer: with  supervision;ambulating;shower seat;Tub transfer Pt/caregiver will Perform Home Exercise Program: Increased strength;Both right and left upper extremity;With written HEP provided;Independently Additional ADL Goal #1: Pt will verbalize 3 strategies to conserve energy during morning ADL routine.  OT Frequency: Min 2X/week   Barriers to D/C:            Co-evaluation              AM-PAC PT "6 Clicks" Daily Activity     Outcome Measure Help from another person eating meals?: None Help from another person taking care of personal grooming?: A Little Help from another person toileting, which includes using toliet, bedpan, or urinal?: A Little Help from another person bathing (including washing, rinsing, drying)?: A Little Help from another person to put on and taking off regular upper body clothing?: A Little Help from another person to put on and taking off regular lower body clothing?: A Little 6 Click Score: 19   End of Session Equipment Utilized During Treatment: Oxygen(4L O2) Nurse Communication: Mobility status(nurse tech - pt urinated 300 mL)  Activity Tolerance: Patient tolerated treatment well Patient left: in bed;with call bell/phone within reach;with bed alarm set  OT Visit Diagnosis: Other abnormalities of gait and mobility (R26.89);Muscle weakness (generalized) (M62.81)                Time: 5176-1607 OT Time Calculation (min): 35 min Charges:  OT  General Charges $OT Visit: 1 Visit OT Evaluation $OT Eval Moderate Complexity: 1 Mod OT Treatments $Self Care/Home Management : 8-22 mins G-Codes:     Norman Herrlich, MS OTR/L  Pager: [place] A Aquanetta Schwarz 02/01/2017, 10:23 AM

## 2017-02-01 NOTE — Progress Notes (Signed)
Progress Note  Patient Name: Ronald Bell Date of Encounter: 02/01/2017  Primary Cardiologist: Dr Marlou Porch  Subjective   Still SOB w/ minimal exertion, says breathing better than yesterday  Inpatient Medications    Scheduled Meds: . atorvastatin  40 mg Oral q1800  . bisoprolol  2.5 mg Oral Daily  . citalopram  20 mg Oral Daily  . docusate sodium  100 mg Oral BID  . enoxaparin (LOVENOX) injection  40 mg Subcutaneous Q24H  . famotidine  20 mg Oral QHS  . fenofibrate  160 mg Oral Q supper  . ferrous sulfate  325 mg Oral BID  . folic acid  1 mg Oral Daily  . furosemide  80 mg Intravenous BID  . guaiFENesin  600 mg Oral BID  . insulin aspart  0-15 Units Subcutaneous TID WC  . insulin aspart  0-5 Units Subcutaneous QHS  . ipratropium-albuterol  3 mL Nebulization Q4H  . levofloxacin  500 mg Oral Daily  . LORazepam  0-4 mg Intravenous Q6H   Followed by  . [START ON 02/02/2017] LORazepam  0-4 mg Intravenous Q12H  . methylPREDNISolone (SOLU-MEDROL) injection  40 mg Intravenous Q12H  . multivitamin with minerals  1 tablet Oral Daily  . thiamine  100 mg Oral Daily   Or  . thiamine  100 mg Intravenous Daily   Continuous Infusions:  PRN Meds: acetaminophen **OR** acetaminophen, guaiFENesin, LORazepam **OR** LORazepam, ondansetron **OR** ondansetron (ZOFRAN) IV   Vital Signs    Vitals:   02/01/17 0426 02/01/17 0551 02/01/17 0711 02/01/17 0725  BP:  105/68  (!) 100/58  Pulse:   90 89  Resp:   (!) 21 18  Temp:    (!) 97.5 F (36.4 C)  TempSrc:    Oral  SpO2: 98%  96% 98%  Weight:      Height:        Intake/Output Summary (Last 24 hours) at 02/01/2017 0844 Last data filed at 02/01/2017 0400 Gross per 24 hour  Intake 240 ml  Output 1375 ml  Net -1135 ml   Filed Weights   01/31/17 0958 01/31/17 1546 02/01/17 0343  Weight: 150 lb (68 kg) 149 lb 11.1 oz (67.9 kg) 147 lb 12.8 oz (67 kg)    Telemetry    SR - Personally Reviewed  ECG    None today -  Personally Reviewed  Physical Exam   GEN: Chronically ill-appearing male in moderate respiratory distress.   Neck: No JVD seen, difficult to assess 2nd body habitus Cardiac: RRR, 2/6 murmur, no rubs, or gallops.  Respiratory: Rales bases w/ decreased BS bilaterally. GI: Soft, nontender, non-distended  MS: No edema; No deformity. Neuro:  Nonfocal  Psych: Normal affect   Labs    Chemistry Recent Labs  Lab 01/31/17 0941 02/01/17 0009  NA 136 139  K 4.3 3.8  CL 102 101  CO2 25 27  GLUCOSE 299* 144*  BUN 11 16  CREATININE 1.07 1.22  CALCIUM 9.1 9.2  PROT  --  7.0  ALBUMIN  --  3.7  AST  --  60*  ALT  --  35  ALKPHOS  --  27*  BILITOT  --  0.8  GFRNONAA >60 55*  GFRAA >60 >60  ANIONGAP 9 11     Hematology Recent Labs  Lab 01/31/17 0941 02/01/17 0009  WBC 16.8* 13.8*  RBC 4.33 4.00*  HGB 13.8 13.0  HCT 43.0 39.2  MCV 99.3 98.0  MCH 31.9 32.5  MCHC 32.1 33.2  RDW 13.8 14.0  PLT 213 160    Cardiac Enzymes Recent Labs  Lab 01/31/17 1208 01/31/17 1822 02/01/17 0009  TROPONINI 0.22* 6.34* 6.04*    Recent Labs  Lab 01/31/17 0951  TROPIPOC 0.04     BNP Recent Labs  Lab 01/31/17 1055  BNP 986.0*     Radiology    Dg Chest Port 1 View  Result Date: 01/31/2017 CLINICAL DATA:  Shortness of breath. EXAM: PORTABLE CHEST 1 VIEW COMPARISON:  August 26, 2016 chest radiograph and chest CT Jul 23, 2015 FINDINGS: Lungs are hyperexpanded. There is extensive fibrosis throughout the lungs. There is bullous disease in the upper lobes. In comparison with most recent prior study, there is increase in interstitial prominence in the lower lung zones, likely interstitial edema superimposed on fibrosis. There is no appreciable airspace consolidation. There are calcified pleural plaques, stable. There is also calcification along the right hemidiaphragm. Of heart is mildly enlarged. There appears to be a degree of relative pulmonary venous hypertension. There is aortic  atherosclerosis. There is an aortic valve replacement. No adenopathy is evident. There is postoperative change involving a lower left rib. IMPRESSION: There is underlying emphysematous change with pulmonary fibrosis. There is increase in interstitial prominence, likely interstitial pulmonary edema superimposed on emphysema. Suspect congestive heart failure with underlying emphysema and fibrosis. There also calcified pleural plaques and calcification along the right hemidiaphragm indicative of prior asbestos exposure. There is aortic atherosclerosis. Patient is status post aortic valve replacement. Aortic Atherosclerosis (ICD10-I70.0). Electronically Signed   By: Lowella Grip III M.D.   On: 01/31/2017 10:30    Cardiac Studies   None yet  Patient Profile     76 y.o. male w/ hx AS>>TAVR 2016, RCA stent 2016, COPD on home O2, HTN, HLD, PAD, GERD, intestinal angiodysplasia, was admitted 11/11 w/ resp failure, rx w/ nebs and Lasix   Assessment & Plan    1.   Acute on chronic diastolic HF (heart failure) ([place]) - agree w/ diuresis - ck echo - follow BMET, I/O and weights   2. Elevated troponin - no CP - ck echo - hx RCA stent 2016, at that time only had prox RCA 40% and distal RCA 90%>stented  3.   Aortic stenosis, S/p TAVR - ck echo  4. PAF - follow on telemetry  Otherwise, per IM Principal Problem:   Acute on chronic respiratory failure (HCC) Active Problems:   ANEMIA, SECONDARY TO BLOOD LOSS   Essential hypertension   GERD   Carotid artery stenosis   Paroxysmal atrial fibrillation (HCC)   Acute exacerbation of chronic obstructive pulmonary disease (COPD) (HCC)   ETOH abuse   Hyperglycemia   Abnormal EKG    For questions or updates, please contact [facility] Please consult [email or web address] for contact info under Cardiology/STEMI.      Signed, Rosaria Ferries, PA-C  02/01/2017, 8:44 AM    History and all data above reviewed.  Patient examined.  I agree with the  findings as above.   He is breathing better this morning.  No chest pain.  Wants to walk around.    Apparently not at baseline.  He usually does not wear O2 during the day.   The patient exam reveals COR:  RRR, sinus tachy ,  Lungs: Decreased breath sounds with few basilar crackles  ,  Abd: Positive bowel sounds, no rebound no guarding, Ext No edema  .  All available labs, radiology testing, previous records reviewed. Agree with documented assessment and plan.  Acute on chronic respiratory failure:  His valve sounds OK on exam.  We can take a quick look at this with a limited echo.   There was likely some component of volume overload.  Can continue IV diuresis today and I expect change to PO in the AM.    Minus Breeding  10:29 AM  02/01/2017

## 2017-02-01 NOTE — Progress Notes (Addendum)
Nutrition Brief Note  RD consulted per COPD Gold Protocol.  Wt Readings from Last 15 Encounters:  02/01/17 147 lb 12.8 oz (67 kg)  11/24/16 150 lb 12.8 oz (68.4 kg)  10/21/16 152 lb (68.9 kg)  09/08/16 154 lb (69.9 kg)  07/21/16 154 lb 9.6 oz (70.1 kg)  07/14/16 154 lb (69.9 kg)  06/16/16 155 lb 1.9 oz (70.4 kg)  04/23/16 159 lb (72.1 kg)  01/21/16 148 lb (67.1 kg)  12/19/15 141 lb (64 kg)  12/09/15 141 lb 6.4 oz (64.1 kg)  12/06/15 144 lb 9.6 oz (65.6 kg)  12/03/15 144 lb (65.3 kg)  12/02/15 140 lb (63.5 kg)  09/06/15 134 lb (60.8 kg)   Body mass index is 27.03 kg/m. Patient meets criteria for Overweight based on current BMI.   Current diet order is Heart Healthy, patient is consuming approximately 100% of meals at this time.   Labs and medications reviewed. CBG's 184-150-140.  No nutrition interventions warranted at this time. If nutrition issues arise, please consult RD.   Arthur Holms, RD, LDN Pager #: [phone] After-Hours Pager #: [phone]

## 2017-02-01 NOTE — Progress Notes (Signed)
Dr Harrell Gave was notified early in the shift about pt's elevated Troponin level and his 5 beat run of V-Tach, VS were stable and patient was asymptomatic, no orders received, no other V-Tach noted and Troponins are trending down at 6.04 from 6.32, will continue to monitor.

## 2017-02-01 NOTE — Plan of Care (Signed)
Pt was instructed on how to use the new call bell system and the phone to call for assistance and that the bed alarm would be put on a tnight to keep him safe and pt verbalized understanding.

## 2017-02-01 NOTE — Progress Notes (Signed)
PULMONARY / CRITICAL CARE MEDICINE   Name: Ronald Bell MRN: [id number] DOB: 1940-12-15    ADMISSION DATE:  01/31/2017 CONSULTATION DATE:   11/11 REFERRING MD:  Denton Brick  CHIEF COMPLAINT: Acute on chronic respiratory failure  HISTORY OF PRESENT ILLNESS:   This is a 76 year old white male followed by Dr. [name] in the pulmonary clinic for chronic respiratory failure.  He has a history of gold 3 chronic obstructive pulmonary disease, with FEV1 most recently at 40%.  He is steroid dependent at baseline requiring oxygen at at bedtime.  His underlying pulmonary status has been further complicated by history of severe aortic stenosis for which he is undergone TAVR back in August 2016 now with evidence of worsening valvular dysfunction.  He presented to the emergency room on 11/11 with rather abrupt onset of shortness of breath that morning.  He notes that he was in usual state of health up until that point denying sick exposure, sinus congestion, nasal discharge, sore throat, cough, or chest pain.  Reports he got up in the morning to take his bronchodilator, felt "funny" went back to bed, noted he was getting more short of breath this progressed he then started to wheeze more and more his rescue bronchodilator did not give him relief so he called EMS.  Of note he has a very heavy history of alcohol abuse.  In the emergency room he was found to be afebrile, his saturations were 94% on noninvasive positive pressure ventilation.  He had a respiratory rate of 46 and was in acute distress.  In the ER he was continued on noninvasive positive pressure ventilation, given IV Lasix, and inhaled bronchodilators.  His initial BNP was over 900.  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  Pulmonary was asked to see given concern about risk for clinical decline.  SUBJECTIVE:  Feels much  better this AM  VITAL SIGNS: BP (!) 100/58 (BP Location: Right Arm)   Pulse 89   Temp (!) 97.5 F (36.4 C) (Oral)   Resp 18   Ht 5\' 2"  (1.575 m)   Wt 67 kg (147 lb 12.8 oz)   SpO2 98%   BMI 27.03 kg/m   HEMODYNAMICS:    VENTILATOR SETTINGS: FiO2 (%):  [40 %] 40 %  INTAKE / OUTPUT: I/O last 3 completed shifts: In: 240 [P.O.:240] Out: 1375 [Urine:1375]  PHYSICAL EXAMINATION: General: Chronically ill appearing male, NAD Neuro: Awake alert oriented moves all extremities no focal deficits HEENT: Face mask in place no clear jugular venous distention Cardiovascular: RRR, Nl S1/S2, -M/R/G Lungs: Bibasilar crackles Abdomen: Soft, NT, ND and +BS Musculoskeletal: Equal strength and bulk Skin: Warm and intact without skin breakdown  LABS:  BMET Recent Labs  Lab 01/31/17 0941 02/01/17 0009  NA 136 139  K 4.3 3.8  CL 102 101  CO2 25 27  BUN 11 16  CREATININE 1.07 1.22  GLUCOSE 299* 144*    Electrolytes Recent Labs  Lab 01/31/17 0941 02/01/17 0009  CALCIUM 9.1 9.2    CBC Recent Labs  Lab 01/31/17 0941 02/01/17 0009  WBC 16.8* 13.8*  HGB 13.8 13.0  HCT 43.0 39.2  PLT 213 160    Coag's Recent Labs  Lab 02/01/17 0009  INR 1.19    Sepsis Markers No results for input(s): LATICACIDVEN, PROCALCITON, O2SATVEN in the last 168 hours.  ABG Recent Labs  Lab 01/31/17 1213  PHART 7.315*  PCO2ART 51.1*  PO2ART 90.0    Liver Enzymes Recent Labs  Lab 02/01/17 0009  AST 60*  ALT 35  ALKPHOS 27*  BILITOT 0.8  ALBUMIN 3.7    Cardiac Enzymes Recent Labs  Lab 01/31/17 1208 01/31/17 1822 02/01/17 0009  TROPONINI 0.22* 6.34* 6.04*    Glucose Recent Labs  Lab 01/31/17 1550 01/31/17 1939 02/01/17 0725  GLUCAP 184* 150* 140*    Imaging Dg Chest Port 1 View  Result Date: 02/01/2017 CLINICAL DATA:  Increasing shortness of breath with cough. EXAM: PORTABLE CHEST 1 VIEW COMPARISON:  01/31/2017. FINDINGS: Stable enlarged cardiomediastinal  silhouette. Emphysematous change with pulmonary fibrosis, stable. Improved interstitial edema. No consolidation or pneumothorax. Previous ORIF LEFT rib fracture. Stable pleural calcifications along the diaphragm. IMPRESSION: Improved interstitial edema.  No consolidation or pneumothorax. Electronically Signed   By: Staci Righter M.D.   On: 02/01/2017 08:53   Dg Chest Port 1 View  Result Date: 01/31/2017 CLINICAL DATA:  Shortness of breath. EXAM: PORTABLE CHEST 1 VIEW COMPARISON:  August 26, 2016 chest radiograph and chest CT Jul 23, 2015 FINDINGS: Lungs are hyperexpanded. There is extensive fibrosis throughout the lungs. There is bullous disease in the upper lobes. In comparison with most recent prior study, there is increase in interstitial prominence in the lower lung zones, likely interstitial edema superimposed on fibrosis. There is no appreciable airspace consolidation. There are calcified pleural plaques, stable. There is also calcification along the right hemidiaphragm. Of heart is mildly enlarged. There appears to be a degree of relative pulmonary venous hypertension. There is aortic atherosclerosis. There is an aortic valve replacement. No adenopathy is evident. There is postoperative change involving a lower left rib. IMPRESSION: There is underlying emphysematous change with pulmonary fibrosis. There is increase in interstitial prominence, likely interstitial pulmonary edema superimposed on emphysema. Suspect congestive heart failure with underlying emphysema and fibrosis. There also calcified pleural plaques and calcification along the right hemidiaphragm indicative of prior asbestos exposure. There is aortic atherosclerosis. Patient is status post aortic valve replacement. Aortic Atherosclerosis (ICD10-I70.0). Electronically Signed   By: Lowella Grip III M.D.   On: 01/31/2017 10:30     STUDIES:    CULTURES:   ANTIBIOTICS: Levaquin 11/11  SIGNIFICANT EVENTS:   LINES/TUBES:  I  reviewed CXR myself, pulmonary edema noted.  DISCUSSION: Agree with stepdown unit admission seems to have improved with treatment in the emergency room.  Although he certainly has underlying airflow limitations with chronic respiratory failure in the setting of his COPD this presentation is not consistent with typical COPD exacerbation.  Would favor decompensated heart failure in the setting of a progressively worsening aortic valve superimposed on what is likely some degree of noncompliance.  At this point would continue to focus on aggressive diuresis, as needed noninvasive positive pressure ventilation and we will provide supportive care as far as his chronic obstructive pulmonary disease.  Given his heavy EtOH abuse he has had a exceedingly high risk for withdrawal  ASSESSMENT / PLAN:   Acute on chronic respiratory failure in the setting of decompensated heart failure further complicated by underlying chronic obstructive pulmonary disease.   COPD with steroid and oxygen dependence FEV1 40% predicted  the presentation would be atypical for COPD exacerbation, the fact that he is improved with Lasix and positive pressure would argue further against primary COPD exacerbation. Plan Change BiPAP to PRN at this point Scheduled bronchodilators as ordered Solu-Medrol 40 mg every 12, begin weaning in AM if patient continues to feel better Treat heart failure,  agree with aggressive diuresis, however will need to watch this carefully given underlying aortic stenosis Levaquin reasonable, will need to watch QTc interval, would stop it after a total of 5 days, doubt this an infectious issue here  Acute decompensated diastolic heart failure Rule out demand ischemia Severe aortic stenosis Seen by cardiology,  Plan Diureses as ordered Telemetry monitoring as ordered Cards following  History of gastric AVMs Plan PPI  History of heavy EtOH abuse. Plan CIWA as ordered  Diabetes with  hyperglycemia Plan Sliding scale insulin  Discussed with PCCM-NP  PCCM will sign off, please call back if needed.  Rush Farmer, M.D. [facility] Pulmonary/Critical Care Medicine. Pager: [phone]. After hours pager: [phone].  02/01/2017, 10:05 AM

## 2017-02-01 NOTE — Consult Note (Signed)
   [facility] Inpatient Consult   02/01/2017  Ronald Bell 07-27-1940 [id number]  Patient was assessed for potential [facility] Care Management services in the Medicare [place].  Patient is a COPD III Gold patient . Met with the patient at the bedside.  Patient endorses [facility].  Patient endorses that he remains active with [facility].  He states he sees the nurse every 2 weeks or so.  Call place to [facility] regarding patients admission and to confirm his status with them.  Spoke with Tammy and she confirms patient remains in their program.  [place] Management will sign off. Will follow up with inpatient RNCM as she was not currently available. For questions, please contact:  Natividad Brood, RN BSN [facility] Liaison  [phone] business mobile phone Toll free office [phone]

## 2017-02-01 NOTE — Evaluation (Signed)
Physical Therapy Evaluation Patient Details Name: Ronald Bell MRN: [id number] DOB: [birth year] Today's Date: 02/01/2017   History of Present Illness  Pt is a 76 y.o. male who presented to the ED with wheeing and significant worsening of shortness of breath. 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   Clinical Impression  Pt admitted with/for worsening SOB.  Pt currently limited functionally due to the problems listed below.  (see problems list.)  Pt will benefit from PT to maximize function and safety to be able to get home safely with available assist of wife.     Follow Up Recommendations Home health PT    Equipment Recommendations  None recommended by PT    Recommendations for Other Services       Precautions / Restrictions Precautions Precautions: Fall Precaution Comments: Watch O2 Restrictions Weight Bearing Restrictions: No      Mobility  Bed Mobility Overal bed mobility: Needs Assistance Bed Mobility: Supine to Sit;Sit to Supine     Supine to sit: HOB elevated;Min guard Sit to supine: Min guard;HOB elevated   General bed mobility comments: Min guard assist for safety.   Transfers Overall transfer level: Needs assistance Equipment used: None Transfers: Sit to/from Stand Sit to Stand: Min guard         General transfer comment: Min guard assist for safety.   Ambulation/Gait Ambulation/Gait assistance: Min guard Ambulation Distance (Feet): 130 Feet(with 2 standing rests) Assistive device: None Gait Pattern/deviations: Step-through pattern   Gait velocity interpretation: Below normal speed for age/gender General Gait Details: Tentative, but steady gait.  Pt feeling steady enough to walk alone.  Stairs            Wheelchair Mobility    Modified Rankin (Stroke Patients  Only)       Balance Overall balance assessment: Needs assistance Sitting-balance support: No upper extremity supported;Feet supported Sitting balance-Leahy Scale: Fair     Standing balance support: No upper extremity supported;During functional activity Standing balance-Leahy Scale: Fair Standing balance comment: Min guard assist for dynamic standing.                              Pertinent Vitals/Pain Pain Assessment: No/denies pain    Home Living Family/patient expects to be discharged to:: Private residence Living Arrangements: Spouse/significant other Available Help at Discharge: Family;Available 24 hours/day Type of Home: Other(Comment)(condo) Home Access: Stairs to enter   Entrance Stairs-Number of Steps: 2 Home Layout: One level Home Equipment: Walker - 2 wheels;Cane - single point;Shower seat      Prior Function Level of Independence: Independent         Comments: Independent with basic ADL. Does drive but has not been driving much recently. Wife does shopping, etc.      Hand Dominance        Extremity/Trunk Assessment   Upper Extremity Assessment Upper Extremity Assessment: Generalized weakness    Lower Extremity Assessment Lower Extremity Assessment: Overall WFL for tasks assessed;Generalized weakness(mild proximal/ core weakness bil)       Communication   Communication: No difficulties  Cognition Arousal/Alertness: Awake/alert Behavior During Therapy: WFL for tasks assessed/performed Overall Cognitive Status: No family/caregiver present to determine baseline cognitive functioning  General Comments: Pt impulsive and noted decreased short-term memory.       General Comments General comments (skin integrity, edema, etc.): sats on RA94% at 100 bpm.  During gait on 4L, sats at 93% at 100 bpm,; 3L, 100% at 103 bpm, then 97% at 103, With 2L [place], drop to 90% at 105 bpm.    Exercises      Assessment/Plan    PT Assessment Patient needs continued PT services  PT Problem List Decreased strength;Decreased activity tolerance;Decreased balance;Decreased mobility;Cardiopulmonary status limiting activity       PT Treatment Interventions Gait training;Functional mobility training;Therapeutic activities;Therapeutic exercise;Patient/family education    PT Goals (Current goals can be found in the Care Plan section)  Acute Rehab PT Goals Patient Stated Goal: to be able to breathe better PT Goal Formulation: With patient Time For Goal Achievement: 02/08/17 Potential to Achieve Goals: Good    Frequency Min 3X/week   Barriers to discharge        Co-evaluation               AM-PAC PT "6 Clicks" Daily Activity  Outcome Measure Difficulty turning over in bed (including adjusting bedclothes, sheets and blankets)?: A Little Difficulty moving from lying on back to sitting on the side of the bed? : A Little Difficulty sitting down on and standing up from a chair with arms (e.g., wheelchair, bedside commode, etc,.)?: None Help needed moving to and from a bed to chair (including a wheelchair)?: A Little Help needed walking in hospital room?: A Little Help needed climbing 3-5 steps with a railing? : A Little 6 Click Score: 19    End of Session Equipment Utilized During Treatment: Oxygen Activity Tolerance: Patient tolerated treatment well Patient left: in bed;with call bell/phone within reach;with family/visitor present;Other (comment)(sitting in a chair position) Nurse Communication: Mobility status PT Visit Diagnosis: Unsteadiness on feet (R26.81);Muscle weakness (generalized) (M62.81);Difficulty in walking, not elsewhere classified (R26.2)    Time: 1040-1106 PT Time Calculation (min) (ACUTE ONLY): 26 min   Charges:   PT Evaluation $PT Eval Moderate Complexity: 1 Mod PT Treatments $Gait Training: 8-22 mins   PT G Codes:        02-10-2017  Donnella Sham,  PT [phone]  (pager)  Tessie Fass Kierre Deines 2017/02/10, 12:01 PM

## 2017-02-02 ENCOUNTER — Other Ambulatory Visit (HOSPITAL_COMMUNITY): Payer: Medicare Other

## 2017-02-02 ENCOUNTER — Other Ambulatory Visit: Payer: Self-pay

## 2017-02-02 DIAGNOSIS — I248 Other forms of acute ischemic heart disease: Secondary | ICD-10-CM

## 2017-02-02 LAB — CBC WITH DIFFERENTIAL/PLATELET
BASOS ABS: 0 10*3/uL (ref 0.0–0.1)
Basophils Relative: 0 %
EOS ABS: 0 10*3/uL (ref 0.0–0.7)
EOS PCT: 0 %
HCT: 41.9 % (ref 39.0–52.0)
Hemoglobin: 13.5 g/dL (ref 13.0–17.0)
LYMPHS ABS: 0.5 10*3/uL — AB (ref 0.7–4.0)
Lymphocytes Relative: 4 %
MCH: 32.1 pg (ref 26.0–34.0)
MCHC: 32.2 g/dL (ref 30.0–36.0)
MCV: 99.5 fL (ref 78.0–100.0)
Monocytes Absolute: 1.2 10*3/uL — ABNORMAL HIGH (ref 0.1–1.0)
Monocytes Relative: 9 %
Neutro Abs: 12.3 10*3/uL — ABNORMAL HIGH (ref 1.7–7.7)
Neutrophils Relative %: 87 %
PLATELETS: 171 10*3/uL (ref 150–400)
RBC: 4.21 MIL/uL — AB (ref 4.22–5.81)
RDW: 14.3 % (ref 11.5–15.5)
WBC: 14 10*3/uL — AB (ref 4.0–10.5)

## 2017-02-02 LAB — GLUCOSE, CAPILLARY
GLUCOSE-CAPILLARY: 116 mg/dL — AB (ref 65–99)
Glucose-Capillary: 118 mg/dL — ABNORMAL HIGH (ref 65–99)
Glucose-Capillary: 167 mg/dL — ABNORMAL HIGH (ref 65–99)

## 2017-02-02 LAB — BASIC METABOLIC PANEL
Anion gap: 10 (ref 5–15)
BUN: 27 mg/dL — AB (ref 6–20)
CALCIUM: 9.6 mg/dL (ref 8.9–10.3)
CO2: 29 mmol/L (ref 22–32)
Chloride: 101 mmol/L (ref 101–111)
Creatinine, Ser: 1.38 mg/dL — ABNORMAL HIGH (ref 0.61–1.24)
GFR calc Af Amer: 55 mL/min — ABNORMAL LOW (ref >60)
GFR, EST NON AFRICAN AMERICAN: 47 mL/min — AB (ref >60)
Glucose, Bld: 110 mg/dL — ABNORMAL HIGH (ref 65–99)
POTASSIUM: 4.4 mmol/L (ref 3.5–5.1)
SODIUM: 140 mmol/L (ref 135–145)

## 2017-02-02 MED ORDER — FUROSEMIDE 80 MG PO TABS
80.0000 mg | ORAL_TABLET | Freq: Two times a day (BID) | ORAL | Status: DC
  Administered 2017-02-02 – 2017-02-04 (×4): 80 mg via ORAL
  Filled 2017-02-02 (×4): qty 1

## 2017-02-02 MED ORDER — TEMAZEPAM 7.5 MG PO CAPS
7.5000 mg | ORAL_CAPSULE | Freq: Once | ORAL | Status: AC
  Administered 2017-02-02: 7.5 mg via ORAL
  Filled 2017-02-02: qty 1

## 2017-02-02 MED ORDER — ASPIRIN EC 81 MG PO TBEC
81.0000 mg | DELAYED_RELEASE_TABLET | Freq: Every day | ORAL | Status: DC
  Administered 2017-02-02 – 2017-02-08 (×7): 81 mg via ORAL
  Filled 2017-02-02 (×7): qty 1

## 2017-02-02 NOTE — Progress Notes (Signed)
Progress Note  Patient Name: Ronald Bell Date of Encounter: 02/02/2017  Primary Cardiologist: Dr. Marlou Porch  Subjective   Pt with supplemental O2. Denies chest pain.  Inpatient Medications    Scheduled Meds: . atorvastatin  40 mg Oral q1800  . bisoprolol  2.5 mg Oral Daily  . citalopram  20 mg Oral Daily  . docusate sodium  100 mg Oral BID  . enoxaparin (LOVENOX) injection  40 mg Subcutaneous Q24H  . famotidine  20 mg Oral QHS  . fenofibrate  160 mg Oral Q supper  . ferrous sulfate  325 mg Oral BID  . folic acid  1 mg Oral Daily  . furosemide  80 mg Intravenous BID  . guaiFENesin  600 mg Oral BID  . insulin aspart  0-15 Units Subcutaneous TID WC  . insulin aspart  0-5 Units Subcutaneous QHS  . ipratropium-albuterol  3 mL Nebulization Q4H  . levofloxacin  500 mg Oral Daily  . LORazepam  0-4 mg Intravenous Q6H   Followed by  . LORazepam  0-4 mg Intravenous Q12H  . methylPREDNISolone (SOLU-MEDROL) injection  40 mg Intravenous Q12H  . multivitamin with minerals  1 tablet Oral Daily  . potassium chloride  40 mEq Oral BID  . thiamine  100 mg Oral Daily   Continuous Infusions:  PRN Meds: acetaminophen **OR** acetaminophen, guaiFENesin, LORazepam **OR** LORazepam, ondansetron **OR** ondansetron (ZOFRAN) IV   Vital Signs    Vitals:   02/02/17 0649 02/02/17 0747 02/02/17 0819 02/02/17 0900  BP:  137/72  119/71  Pulse:  97  97  Resp:  18    Temp:  98.3 F (36.8 C)    TempSrc:  Oral    SpO2:  100% 99% 97%  Weight: 145 lb 9.6 oz (66 kg)     Height:        Intake/Output Summary (Last 24 hours) at 02/02/2017 1045 Last data filed at 02/02/2017 1030 Gross per 24 hour  Intake 900 ml  Output 2055 ml  Net -1155 ml   Filed Weights   01/31/17 1546 02/01/17 0343 02/02/17 0649  Weight: 149 lb 11.1 oz (67.9 kg) 147 lb 12.8 oz (67 kg) 145 lb 9.6 oz (66 kg)     Physical Exam   General: Well developed, well nourished, male appearing in no acute distress. Head:  Normocephalic, atraumatic.  Neck: Supple without bruits, JVD Lungs:  Resp regular and unlabored, occasional expiratory wheeze Heart: RRR, S1, S2, no S3, S4, or murmur; no rub. Abdomen: Soft, non-tender, non-distended with normoactive bowel sounds. No hepatomegaly. No rebound/guarding. No obvious abdominal masses. Extremities: No clubbing, cyanosis, no edema. Distal pedal pulses are 2+ bilaterally. Neuro: Alert and oriented X 3. Moves all extremities spontaneously. Psych: Normal affect.  Labs    Chemistry Recent Labs  Lab 01/31/17 0941 02/01/17 0009 02/02/17 0520  NA 136 139 140  K 4.3 3.8 4.4  CL 102 101 101  CO2 25 27 29   GLUCOSE 299* 144* 110*  BUN 11 16 27*  CREATININE 1.07 1.22 1.38*  CALCIUM 9.1 9.2 9.6  PROT  --  7.0  --   ALBUMIN  --  3.7  --   AST  --  60*  --   ALT  --  35  --   ALKPHOS  --  27*  --   BILITOT  --  0.8  --   GFRNONAA >60 55* 47*  GFRAA >60 >60 55*  ANIONGAP 9 11 10      Hematology  Recent Labs  Lab 01/31/17 0941 02/01/17 0009 02/02/17 0520  WBC 16.8* 13.8* 14.0*  RBC 4.33 4.00* 4.21*  HGB 13.8 13.0 13.5  HCT 43.0 39.2 41.9  MCV 99.3 98.0 99.5  MCH 31.9 32.5 32.1  MCHC 32.1 33.2 32.2  RDW 13.8 14.0 14.3  PLT 213 160 171    Cardiac Enzymes Recent Labs  Lab 01/31/17 1208 01/31/17 1822 02/01/17 0009  TROPONINI 0.22* 6.34* 6.04*    Recent Labs  Lab 01/31/17 0951  TROPIPOC 0.04     BNP Recent Labs  Lab 01/31/17 1055  BNP 986.0*     DDimer No results for input(s): DDIMER in the last 168 hours.   Radiology    Dg Chest Port 1 View  Result Date: 02/01/2017 CLINICAL DATA:  Increasing shortness of breath with cough. EXAM: PORTABLE CHEST 1 VIEW COMPARISON:  01/31/2017. FINDINGS: Stable enlarged cardiomediastinal silhouette. Emphysematous change with pulmonary fibrosis, stable. Improved interstitial edema. No consolidation or pneumothorax. Previous ORIF LEFT rib fracture. Stable pleural calcifications along the diaphragm.  IMPRESSION: Improved interstitial edema.  No consolidation or pneumothorax. Electronically Signed   By: Staci Righter M.D.   On: 02/01/2017 08:53     Telemetry    Sinus rhythm - Personally Reviewed  ECG    No new tracings - Personally Reviewed   Cardiac Studies   Echo pending  Echo 07/28/16: Study Conclusions - Left ventricle: The cavity size was mildly dilated. Wall   thickness was normal. Systolic function was normal. The estimated   ejection fraction was in the range of 50% to 55%. Doppler   parameters are consistent with abnormal left ventricular   relaxation (grade 1 diastolic dysfunction). - Aortic valve: A bioprosthesis was present and functioning   normally. Mean gradient (S): 16 mm Hg. Valve area (VTI): 1.29   cm^2. - Left atrium: The atrium was mildly to moderately dilated.  Impressions: - Compared to the previous studies, gradients have improved across   the aortic valve prosthesis.  Patient Profile     76 y.o. male w/ hx AS>>TAVR 2016, RCA stent 2016, COPD on home O2, HTN, HLD, PAD, GERD, intestinal angiodysplasia, was admitted 11/11 w/ resp failure, rx w/ nebs and Lasix.  Assessment & Plan    1. Acute on chronic diastolic heart failure - echo pending - diuresing on 80 mg IV BID - he is overall net negative 2.6 L with 1.775 L urine output yesterday - weight is 145 lbs, down from 150 mg at admission - K is stable at 4.4 - sCr 1.38 (1.22), will transition to PO lasix - 80 mg PO lasix BID   2. Elevated troponin - troponin 6.34 --> 6.04 - pt denies chest pain - awaiting echo  - hx of RCA stent (2016) with proximal RCA of 40% and distal RCA of 90% that was stented   3. Aortic stenosis, s/p TAVR - awaiting echo   4. PAF - sinus on telemetry   5. Respiratory failure - continue breathing treatments per primary team - respiratory issues are likely related more to his COPD and pulmonary fibrosis than to heart failure as he is not extremely fluid  overloaded on exam    Signed, Ledora Bottcher , PA-C 10:45 AM 02/02/2017    Pager: [phone]   History and all data above reviewed.  Patient examined.  I agree with the findings as above.   His breathing is better but not at baseline The patient exam reveals COR:RRR  ,  Lungs: Decreased  breath sounds without wheezing  ,  Abd: Positive bowel sounds, no rebound no guarding, Ext No edema  .  All available labs, radiology testing, previous records reviewed. Agree with documented assessment and plan. PAF:  Maintaining NSR.  Acute on chronic diastolic HF:  Agree with changing to PO Lasix.  Elevated troponin:  I went back to review the cath from 2016.  This was a difficult situation at that time with calcified vessels and GI bleeding.  At this point I will wait for the results of the echo but would likely not suggest further invasive evaluation pending any change in his EF.  Continue current therapy.   For unclear reasons ASA was not on his admission labs.  He says he was taking this prior to admission and this will be restarted.   Jeneen Rinks Nnamdi Dacus  12:08 PM  02/02/2017

## 2017-02-02 NOTE — Progress Notes (Signed)
Physical Therapy Treatment Patient Details Name: Ronald Bell MRN: [id number] DOB: Mar 23, 1941 Today's Date: 02/02/2017    History of Present Illness Pt is a 76 y.o. male who presented to the ED with wheeing and significant worsening of shortness of breath. 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     PT Comments    Pt notably improving in overall activity tolerance with SpO2 numbers in the mid 90's with significant dyspnea or signs of fatigue.  Emphasis on standing exercise and gait stability/tolerance.    Follow Up Recommendations  Home health PT     Equipment Recommendations  None recommended by PT    Recommendations for Other Services       Precautions / Restrictions Precautions Precautions: Fall Precaution Comments: Watch O2    Mobility  Bed Mobility Overal bed mobility: Needs Assistance Bed Mobility: Supine to Sit     Supine to sit: Min guard     General bed mobility comments: Min guard assist for safety.   Transfers Overall transfer level: Needs assistance   Transfers: Sit to/from Stand Sit to Stand: Min guard            Ambulation/Gait Ambulation/Gait assistance: Min guard Ambulation Distance (Feet): 300 Feet Assistive device: None(holding the telemetry box) Gait Pattern/deviations: Step-through pattern   Gait velocity interpretation: Below normal speed for age/gender General Gait Details: generally steady with some drifting when he scanned environment or looked behind to approaching people with carts.  Pt unable to attain significant speed increase.   Stairs            Wheelchair Mobility    Modified Rankin (Stroke Patients Only)       Balance     Sitting balance-Leahy Scale: Fair       Standing balance-Leahy Scale: Fair                              Cognition Arousal/Alertness: Awake/alert Behavior During Therapy: WFL for tasks assessed/performed Overall Cognitive Status: Within Functional Limits for tasks assessed                                        Exercises General Exercises - Lower Extremity Hip ABduction/ADduction: AROM;Strengthening;Both;10 reps;Standing Hip Flexion/Marching: AROM;Strengthening;Both;10 reps;Standing Toe Raises: AROM;Both;10 reps;Standing Heel Raises: AROM;Both;10 reps;Standing    General Comments General comments (skin integrity, edema, etc.): SpO2 on 3 L [place] during activity maintained in the mid 90's,  EHR in the 90's and 100's bpm      Pertinent Vitals/Pain Pain Assessment: No/denies pain    Home Living                      Prior Function            PT Goals (current goals can now be found in the care plan section) Acute Rehab PT Goals PT Goal Formulation: With patient Time For Goal Achievement: 02/08/17 Potential to Achieve Goals: Good Progress towards PT goals: Progressing toward goals    Frequency    Min 3X/week      PT Plan Current plan remains appropriate    Co-evaluation              AM-PAC  PT "6 Clicks" Daily Activity  Outcome Measure  Difficulty turning over in bed (including adjusting bedclothes, sheets and blankets)?: A Little Difficulty moving from lying on back to sitting on the side of the bed? : A Little Difficulty sitting down on and standing up from a chair with arms (e.g., wheelchair, bedside commode, etc,.)?: None Help needed moving to and from a bed to chair (including a wheelchair)?: A Little Help needed walking in hospital room?: A Little Help needed climbing 3-5 steps with a railing? : A Little 6 Click Score: 19    End of Session Equipment Utilized During Treatment: Oxygen Activity Tolerance: Patient tolerated treatment well Patient left: in chair;with call bell/phone within reach Nurse Communication:  Mobility status PT Visit Diagnosis: Unsteadiness on feet (R26.81);Muscle weakness (generalized) (M62.81);Difficulty in walking, not elsewhere classified (R26.2)     Time: 1595-3967 PT Time Calculation (min) (ACUTE ONLY): 16 min  Charges:  $Gait Training: 8-22 mins                    G Codes:       2017/02/14  Donnella Sham, PT [phone]  (pager)   Tessie Fass [name] Pautsch 02-14-17, 3:27 PM

## 2017-02-02 NOTE — Progress Notes (Signed)
Patient ambulated in hall per Mobility staff, tolerated well. Patient refusing bed-alarm. Patient upset he is unable to get out of bed by himself and has stated he wishes to get up as he wishes. While talking with patient, his wife called, and was very upset due to a voicemail she had received from her husband the patient regarding the bed alarm and him not being able to get out of bed without it going off. I listened and reassured her that today we had already walked the patient once and he was scheduled to walk two more times and that his bed alarm had been turned off per his refusal to have it on. Patients bed alarm has now been turned off per wife and patients wishes

## 2017-02-02 NOTE — Progress Notes (Signed)
PROGRESS NOTE    Ronald Bell  JKK:[id number] DOB: 01/18/1941 DOA: 01/31/2017 PCP: Ann Held, DO  Brief Narrative: 76 y.o.malewith medical history significantsteroid-dependent COPD related to pulmonary fibrosis,chronic respiratory failure with hypoxia and hypercapnia, aortic stenosis status post aVR, hypertension, EtOH abuse, anxiety,C AD status post stents presents to the emergency department chief complaint worsening shortness of breath.Initial evaluation in the emergency department reveals acute on chronic respiratory failure related to COPD exacerbation setting of possible acute on chronic heart failure likely related toSTEMI. [facility]ist asked to admit  Information is obtained from patient and the chart. He reports he was his usual state of health early this morning when he developed sudden onset worsening shortness of breath and all herbal wheezing. 40 at a brief episodes similar yesterday but uses nebs and inhalers and felt better. He denies chest pain palpitations diaphoresis nausea vomiting. He reports his symptoms feel similar to his prior COPD exacerbations. He does admit to some increased cough and sputum production. He denies headache dizziness syncope or near-syncope. He denies lower extremity edema. He reports he uses oxygen at home but only at night. He denies fever chills recent travel or sick contacts. He called EMS who reportedly provided him with nebulizers 125 mg of Solu-Medrol 2 g of magnesium and was placed on C Pap.Of note wife called hospital and reported patient drinks approximately one case of beer per day  ED Course:In the emergency department he is afebrile with hypertension and tachycardia as well as tachypnea. Oxygen saturation level 94% on BiPAP with a respiratory rate of 46.Is provided with 80 mg a Lasix and continuous nebs     Assessment & Plan:   Principal Problem:   Acute on chronic respiratory failure (HCC) Active  Problems:   ANEMIA, SECONDARY TO BLOOD LOSS   Essential hypertension   GERD   Carotid artery stenosis   Aortic stenosis   Paroxysmal atrial fibrillation (HCC)   Acute on chronic diastolic HF (heart failure) (HCC)   Acute exacerbation of chronic obstructive pulmonary disease (COPD) (HCC)   ETOH abuse   Hyperglycemia   Abnormal EKG   Acute pulmonary edema (HCC)   Hypoxemia   Acute congestive heart failure (HCC) Acute on chronic respiratory failure with hypoxia and hypercapnia secondary to COPD exacerbation and chronic pulmonary fibrosis plus acute on chronic diastolic heart failure-patient does have oxygen at home 24 hours a day.  He is also steroid dependent for COPD and pulmonary fibrosis.  At the time of admission his BNP was 689 and he was put on BiPAP.  Today he is awake alert off the BiPAP and he reports that he is feeling much better than yesterday.  He is followed by cardiology at this time.  We will continue his IV Solu-Medrol Lasix and scheduled nebulizers.  BiPAP standby.  Repeat chest x-ray done 02/01/2017 shows improvement in pulmonary edema.  Aortic stenosis status post aortic valve replacement not on anticoagulation because of history of GI bleed.  He has elevated troponin peaked at 6.34.  With no acute EKG changes.  Cardiology following.  Patient had a limited echocardiogram the results are pending at this time.  History of CAD and stent placement.  EtOH abuse on Siva protocol.  Patient very tachycardic and tachypneic at this time discussed with the nurse to give him a dose of lorazepam.  HTN patient currently on not any medications at this time blood pressure stable.     DVT prophylaxis: Lovenox Code Status: Full code Family Communication:  None Disposition Plan: TBD.  Once his stable from a respiratory standpoint he can be discharged home on cardiac as well as pulmonary medications as he is on now.  Cardiology and pulmonary following. Consultants: Cardiology  pulmonary  Procedures none  Antimicrobials: Levofloxacin to be stopped on 02/04/2017.  Subjective Feels better during breathing treatment talking in full sentences he reports he walked around without any difficulty.  He is upset that he has a bed alarm on and that makes noise every time he gets up.   Objective: Resting in bed in no acute distress Vitals:   02/02/17 0901 02/02/17 1100 02/02/17 1157 02/02/17 1221  BP: 119/71 119/71  119/71  Pulse: 97 91  95  Resp:      Temp:  97.8 F (36.6 C)  98.2 F (36.8 C)  TempSrc:  Oral  Oral  SpO2:  95% 97% 95%  Weight:      Height:        Intake/Output Summary (Last 24 hours) at 02/02/2017 1351 Last data filed at 02/02/2017 1030 Gross per 24 hour  Intake 660 ml  Output 2055 ml  Net -1395 ml   Filed Weights   01/31/17 1546 02/01/17 0343 02/02/17 0649  Weight: 67.9 kg (149 lb 11.1 oz) 67 kg (147 lb 12.8 oz) 66 kg (145 lb 9.6 oz)    Examination:  General exam: Appears calm and comfortable  Respiratory system scattered rhonchi and wheezing bilateral few crackles at the base respiratory effort normal. Cardiovascular system: S1 & S2 heard, RRR. No JVD, murmurs, rubs, gallops or clicks. No pedal edema. Gastrointestinal system: Abdomen is nondistended, soft and nontender. No organomegaly or masses felt. Normal bowel sounds heard. Central nervous system: Alert and oriented. No focal neurological deficits. Extremities: Symmetric 5 x 5 power. Skin: No rashes, lesions or ulcers Psychiatry: Judgement and insight appear normal. Mood & affect appropriate.     Data Reviewed: I have personally reviewed following labs and imaging studies  CBC: Recent Labs  Lab 01/31/17 0941 02/01/17 0009 02/02/17 0520  WBC 16.8* 13.8* 14.0*  NEUTROABS 11.2*  --  12.3*  HGB 13.8 13.0 13.5  HCT 43.0 39.2 41.9  MCV 99.3 98.0 99.5  PLT 213 160 301   Basic Metabolic Panel: Recent Labs  Lab 01/31/17 0941 02/01/17 0009 02/02/17 0520  NA 136 139 140   K 4.3 3.8 4.4  CL 102 101 101  CO2 25 27 29   GLUCOSE 299* 144* 110*  BUN 11 16 27*  CREATININE 1.07 1.22 1.38*  CALCIUM 9.1 9.2 9.6   GFR: Estimated Creatinine Clearance: 36.9 mL/min (A) (by C-G formula based on SCr of 1.38 mg/dL (H)). Liver Function Tests: Recent Labs  Lab 02/01/17 0009  AST 60*  ALT 35  ALKPHOS 27*  BILITOT 0.8  PROT 7.0  ALBUMIN 3.7   No results for input(s): LIPASE, AMYLASE in the last 168 hours. No results for input(s): AMMONIA in the last 168 hours. Coagulation Profile: Recent Labs  Lab 02/01/17 0009  INR 1.19   Cardiac Enzymes: Recent Labs  Lab 01/31/17 1208 01/31/17 1822 02/01/17 0009  TROPONINI 0.22* 6.34* 6.04*   BNP (last 3 results) No results for input(s): PROBNP in the last 8760 hours. HbA1C: Recent Labs    01/31/17 1200  HGBA1C 6.2*   CBG: Recent Labs  Lab 01/31/17 1939 02/01/17 0725 02/01/17 1739 02/01/17 2141 02/02/17 0803  GLUCAP 150* 140* 100* 165* 116*   Lipid Profile: No results for input(s): CHOL, HDL, LDLCALC, TRIG, CHOLHDL, LDLDIRECT  in the last 72 hours. Thyroid Function Tests: No results for input(s): TSH, T4TOTAL, FREET4, T3FREE, THYROIDAB in the last 72 hours. Anemia Panel: No results for input(s): VITAMINB12, FOLATE, FERRITIN, TIBC, IRON, RETICCTPCT in the last 72 hours. Sepsis Labs: No results for input(s): PROCALCITON, LATICACIDVEN in the last 168 hours.  Recent Results (from the past 240 hour(s))  MRSA PCR Screening     Status: None   Collection Time: 01/31/17  3:44 PM  Result Value Ref Range Status   MRSA by PCR NEGATIVE NEGATIVE Final    Comment:        The GeneXpert MRSA Assay (FDA approved for NASAL specimens only), is one component of a comprehensive MRSA colonization surveillance program. It is not intended to diagnose MRSA infection nor to guide or monitor treatment for MRSA infections.          Radiology Studies: Dg Chest Port 1 View  Result Date: 02/01/2017 CLINICAL  DATA:  Increasing shortness of breath with cough. EXAM: PORTABLE CHEST 1 VIEW COMPARISON:  01/31/2017. FINDINGS: Stable enlarged cardiomediastinal silhouette. Emphysematous change with pulmonary fibrosis, stable. Improved interstitial edema. No consolidation or pneumothorax. Previous ORIF LEFT rib fracture. Stable pleural calcifications along the diaphragm. IMPRESSION: Improved interstitial edema.  No consolidation or pneumothorax. Electronically Signed   By: Staci Righter M.D.   On: 02/01/2017 08:53        Scheduled Meds: . aspirin EC  81 mg Oral Daily  . atorvastatin  40 mg Oral q1800  . bisoprolol  2.5 mg Oral Daily  . citalopram  20 mg Oral Daily  . docusate sodium  100 mg Oral BID  . enoxaparin (LOVENOX) injection  40 mg Subcutaneous Q24H  . famotidine  20 mg Oral QHS  . fenofibrate  160 mg Oral Q supper  . ferrous sulfate  325 mg Oral BID  . folic acid  1 mg Oral Daily  . furosemide  80 mg Oral BID  . guaiFENesin  600 mg Oral BID  . insulin aspart  0-15 Units Subcutaneous TID WC  . insulin aspart  0-5 Units Subcutaneous QHS  . ipratropium-albuterol  3 mL Nebulization Q4H  . levofloxacin  500 mg Oral Daily  . LORazepam  0-4 mg Intravenous Q12H  . methylPREDNISolone (SOLU-MEDROL) injection  40 mg Intravenous Q12H  . multivitamin with minerals  1 tablet Oral Daily  . potassium chloride  40 mEq Oral BID  . thiamine  100 mg Oral Daily   Continuous Infusions:   LOS: 2 days      Georgette Shell, MD [facility] If 7PM-7AM, please contact night-coverage [email or web address] Password [email or web address] 02/02/2017, 1:51 PM

## 2017-02-03 ENCOUNTER — Inpatient Hospital Stay (HOSPITAL_COMMUNITY): Payer: Medicare Other

## 2017-02-03 DIAGNOSIS — I340 Nonrheumatic mitral (valve) insufficiency: Secondary | ICD-10-CM

## 2017-02-03 DIAGNOSIS — I5041 Acute combined systolic (congestive) and diastolic (congestive) heart failure: Secondary | ICD-10-CM

## 2017-02-03 DIAGNOSIS — J441 Chronic obstructive pulmonary disease with (acute) exacerbation: Secondary | ICD-10-CM

## 2017-02-03 LAB — ECHOCARDIOGRAM COMPLETE
AOVTI: 86.4 cm
AV Area mean vel: 0.67 cm2
AV Peak grad: 56 mmHg
AV VEL mean LVOT/AV: 0.3
AVA: 0.7 cm2
AVAREAMEANVIN: 0.39 cm2/m2
AVAREAVTI: 0.72 cm2
AVAREAVTIIND: 0.41 cm2/m2
AVG: 34 mmHg
AVPKVEL: 374 cm/s
Ao pk vel: 0.32 m/s
CHL CUP AV PEAK INDEX: 0.42
CHL CUP AV VEL: 0.7
CHL CUP DOP CALC LVOT VTI: 26.6 cm
CHL CUP MV DEC (S): 173
DOP CAL AO MEAN VELOCITY: 273 cm/s
EWDT: 173 ms
FS: 16 % — AB (ref 28–44)
HEIGHTINCHES: 62 in
IV/PV OW: 1.38
LA diam end sys: 43 mm
LA diam index: 2.53 cm/m2
LA vol A4C: 58.4 ml
LA vol: 62.9 mL
LASIZE: 43 mm
LAVOLIN: 37 mL/m2
LV PW d: 8 mm — AB (ref 0.6–1.1)
LVOT area: 2.27 cm2
LVOT diameter: 17 mm
LVOT peak VTI: 0.31 cm
LVOT peak grad rest: 6 mmHg
LVOTPV: 118 cm/s
LVOTSV: 60 mL
MV pk E vel: 74.7 m/s
MVAP: 4.31 cm2
MVPG: 2 mmHg
MVPKAVEL: 110 m/s
P 1/2 time: 51 ms
TAPSE: 18.9 mm
Valve area index: 0.41
WEIGHTICAEL: 2288 [oz_av]

## 2017-02-03 LAB — CBC
HCT: 43.9 % (ref 39.0–52.0)
HEMOGLOBIN: 14.6 g/dL (ref 13.0–17.0)
MCH: 32.6 pg (ref 26.0–34.0)
MCHC: 33.3 g/dL (ref 30.0–36.0)
MCV: 98 fL (ref 78.0–100.0)
Platelets: 182 10*3/uL (ref 150–400)
RBC: 4.48 MIL/uL (ref 4.22–5.81)
RDW: 13.7 % (ref 11.5–15.5)
WBC: 12.8 10*3/uL — ABNORMAL HIGH (ref 4.0–10.5)

## 2017-02-03 LAB — BASIC METABOLIC PANEL
ANION GAP: 10 (ref 5–15)
BUN: 31 mg/dL — ABNORMAL HIGH (ref 6–20)
CALCIUM: 9.4 mg/dL (ref 8.9–10.3)
CHLORIDE: 96 mmol/L — AB (ref 101–111)
CO2: 30 mmol/L (ref 22–32)
CREATININE: 1.43 mg/dL — AB (ref 0.61–1.24)
GFR calc Af Amer: 53 mL/min — ABNORMAL LOW (ref >60)
GFR calc non Af Amer: 45 mL/min — ABNORMAL LOW (ref >60)
Glucose, Bld: 144 mg/dL — ABNORMAL HIGH (ref 65–99)
Potassium: 4.2 mmol/L (ref 3.5–5.1)
SODIUM: 136 mmol/L (ref 135–145)

## 2017-02-03 LAB — GLUCOSE, CAPILLARY
GLUCOSE-CAPILLARY: 116 mg/dL — AB (ref 65–99)
Glucose-Capillary: 161 mg/dL — ABNORMAL HIGH (ref 65–99)
Glucose-Capillary: 170 mg/dL — ABNORMAL HIGH (ref 65–99)
Glucose-Capillary: 147 mg/dL — ABNORMAL HIGH (ref 65–99)

## 2017-02-03 MED ORDER — PERFLUTREN LIPID MICROSPHERE
INTRAVENOUS | Status: AC
  Administered 2017-02-03: 3 mL via INTRAVENOUS
  Filled 2017-02-03: qty 10

## 2017-02-03 MED ORDER — LORAZEPAM 2 MG/ML IJ SOLN: 0.0000 mg | Freq: Two times a day (BID) | INTRAMUSCULAR | Status: AC

## 2017-02-03 MED ORDER — TEMAZEPAM 15 MG PO CAPS
30.0000 mg | ORAL_CAPSULE | Freq: Every evening | ORAL | Status: DC | PRN
  Administered 2017-02-03 – 2017-02-07 (×5): 30 mg via ORAL
  Filled 2017-02-03 (×5): qty 2

## 2017-02-03 MED ORDER — PERFLUTREN LIPID MICROSPHERE
1.0000 mL | INTRAVENOUS | Status: AC | PRN
  Administered 2017-02-03: 3 mL via INTRAVENOUS
  Filled 2017-02-03: qty 10

## 2017-02-03 MED ORDER — IPRATROPIUM-ALBUTEROL 0.5-2.5 (3) MG/3ML IN SOLN
3.0000 mL | Freq: Four times a day (QID) | RESPIRATORY_TRACT | Status: DC | PRN
  Administered 2017-02-03: 3 mL via RESPIRATORY_TRACT
  Filled 2017-02-03: qty 3

## 2017-02-03 MED ORDER — PREDNISONE 20 MG PO TABS
40.0000 mg | ORAL_TABLET | Freq: Every day | ORAL | Status: DC
  Administered 2017-02-03: 40 mg via ORAL
  Filled 2017-02-03: qty 2

## 2017-02-03 MED ORDER — IPRATROPIUM-ALBUTEROL 0.5-2.5 (3) MG/3ML IN SOLN
3.0000 mL | Freq: Three times a day (TID) | RESPIRATORY_TRACT | Status: DC
  Administered 2017-02-03 – 2017-02-08 (×15): 3 mL via RESPIRATORY_TRACT
  Filled 2017-02-03 (×16): qty 3

## 2017-02-03 NOTE — Progress Notes (Signed)
Progress Note  Patient Name: Ronald Bell Date of Encounter: 02/03/2017  Primary Cardiologist: Dr. Marlou Porch  Subjective   Pt denies chest pain and states his breathing is improving. He is on 2L nocturnal O2 at home.  Inpatient Medications    Scheduled Meds: . aspirin EC  81 mg Oral Daily  . atorvastatin  40 mg Oral q1800  . bisoprolol  2.5 mg Oral Daily  . citalopram  20 mg Oral Daily  . docusate sodium  100 mg Oral BID  . enoxaparin (LOVENOX) injection  40 mg Subcutaneous Q24H  . famotidine  20 mg Oral QHS  . fenofibrate  160 mg Oral Q supper  . ferrous sulfate  325 mg Oral BID  . folic acid  1 mg Oral Daily  . furosemide  80 mg Oral BID  . guaiFENesin  600 mg Oral BID  . insulin aspart  0-15 Units Subcutaneous TID WC  . insulin aspart  0-5 Units Subcutaneous QHS  . ipratropium-albuterol  3 mL Nebulization Q4H  . levofloxacin  500 mg Oral Daily  . LORazepam  0-4 mg Intravenous Q12H  . methylPREDNISolone (SOLU-MEDROL) injection  40 mg Intravenous Q12H  . multivitamin with minerals  1 tablet Oral Daily  . potassium chloride  40 mEq Oral BID  . thiamine  100 mg Oral Daily   Continuous Infusions:  PRN Meds: acetaminophen **OR** acetaminophen, guaiFENesin, LORazepam **OR** LORazepam, ondansetron **OR** ondansetron (ZOFRAN) IV   Vital Signs    Vitals:   02/03/17 0335 02/03/17 0404 02/03/17 0736 02/03/17 0800  BP:  (!) 119/56 (!) 84/56 124/88  Pulse:  85 92 97  Resp:  20 (!) 21   Temp:  98 F (36.7 C) 98.1 F (36.7 C)   TempSrc:   Oral   SpO2: 96% 97% 97%   Weight:  143 lb (64.9 kg)    Height:        Intake/Output Summary (Last 24 hours) at 02/03/2017 0909 Last data filed at 02/03/2017 0700 Gross per 24 hour  Intake 1440 ml  Output 550 ml  Net 890 ml   Filed Weights   02/01/17 0343 02/02/17 0649 02/03/17 0404  Weight: 147 lb 12.8 oz (67 kg) 145 lb 9.6 oz (66 kg) 143 lb (64.9 kg)     Physical Exam   General: Well developed, well nourished,  male appearing in no acute distress. Head: Normocephalic, atraumatic.  Neck: Supple without bruits, JVD Lungs:  Resp regular and unlabored, CTA. Heart: RRR, S1, S2, no murmur; no rub. Abdomen: Soft, non-tender, non-distended with normoactive bowel sounds. No hepatomegaly. No rebound/guarding. No obvious abdominal masses. Extremities: No clubbing, cyanosis, no edema. Distal pedal pulses are 1+ bilaterally. Neuro: Alert and oriented X 3. Moves all extremities spontaneously. Psych: Normal affect.  Labs    Chemistry Recent Labs  Lab 01/31/17 0941 02/01/17 0009 02/02/17 0520  NA 136 139 140  K 4.3 3.8 4.4  CL 102 101 101  CO2 25 27 29   GLUCOSE 299* 144* 110*  BUN 11 16 27*  CREATININE 1.07 1.22 1.38*  CALCIUM 9.1 9.2 9.6  PROT  --  7.0  --   ALBUMIN  --  3.7  --   AST  --  60*  --   ALT  --  35  --   ALKPHOS  --  27*  --   BILITOT  --  0.8  --   GFRNONAA >60 55* 47*  GFRAA >60 >60 55*  ANIONGAP 9 11 10  Hematology Recent Labs  Lab 01/31/17 0941 02/01/17 0009 02/02/17 0520  WBC 16.8* 13.8* 14.0*  RBC 4.33 4.00* 4.21*  HGB 13.8 13.0 13.5  HCT 43.0 39.2 41.9  MCV 99.3 98.0 99.5  MCH 31.9 32.5 32.1  MCHC 32.1 33.2 32.2  RDW 13.8 14.0 14.3  PLT 213 160 171    Cardiac Enzymes Recent Labs  Lab 01/31/17 1208 01/31/17 1822 02/01/17 0009  TROPONINI 0.22* 6.34* 6.04*    Recent Labs  Lab 01/31/17 0951  TROPIPOC 0.04     BNP Recent Labs  Lab 01/31/17 1055  BNP 986.0*     DDimer No results for input(s): DDIMER in the last 168 hours.   Radiology    No results found.   Telemetry    Sinus rhythm - Personally Reviewed  ECG    No new tracings - Personally Reviewed  Cardiac Studies   Echo pending  Echo 07/28/16: Study Conclusions - Left ventricle: The cavity size was mildly dilated. Wall thickness was normal. Systolic function was normal. The estimated ejection fraction was in the range of 50% to 55%. Doppler parameters are consistent  with abnormal left ventricular relaxation (grade 1 diastolic dysfunction). - Aortic valve: A bioprosthesis was present and functioning normally. Mean gradient (S): 16 mm Hg. Valve area (VTI): 1.29 cm^2. - Left atrium: The atrium was mildly to moderately dilated.  Impressions: - Compared to the previous studies, gradients have improved across the aortic valve prosthesis.    Patient Profile     76 y.o. male w/ hx AS>>TAVR 2016, RCA stent 2016, COPD on home O2, HTN, HLD, PAD, GERD, intestinal angiodysplasia, was admitted 11/11 w/ resp failure, rx w/ nebs and Lasix.  Assessment & Plan    1. Acute on chronic diastolic heart failure - echo still pending - continues to diurese on 80 mg PO lasix BID - overall net negative 2L, although strict I&Os not charted - pt states he is voiding frequently   2. AKI - labs pending   3. Elevated troponin - pt continues to deny chest pain - awaiting echo - hx of RCA stent (2016) with proximal RCA of 40% and distal RCA of 90% that was stented   4. AS, s/p TAVR - echo pending   5. PAF - sinus on telemetry   6. Respiratory failure - continue breathing treatments per primary team - ordered IS  - per primary team   Signed, Ledora Bottcher , PA-C 9:09 AM 02/03/2017 Pager: [phone]  History and all data above reviewed.  Patient examined.  I agree with the findings as above.  The patient exam reveals COR:RRR  ,  Lungs: Decreased breath sounds without wheezing or crackles  ,  Abd: Positive bowel sounds, no rebound no guarding, Ext No edema  .  All available labs, radiology testing, previous records reviewed. Agree with documented assessment and plan. NSTEMI:  I reviewed the 2016 films today.  I would likely suggest a follow up perfusion study for risk stratification possibly after discharge.  I think this was possibly demand ischemia related to acute on chronic lung disease.  Echo is being done and I will review the results.    Ronald Bell  10:24 AM  02/03/2017

## 2017-02-03 NOTE — Progress Notes (Signed)
Occupational Therapy Treatment Patient Details Name: Ronald Bell MRN: [id number] DOB: 1940/05/13 Today's Date: 02/03/2017    History of present illness Pt is a 76 y.o. male who presented to the ED with wheeing and significant worsening of shortness of breath. 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    OT comments  Pt educated in energy conservation and given handout to reinforce. Practiced pursed lip breathing. Pt functioning at a supervision level in ADL and mobility without a device this visit.   Follow Up Recommendations  Home health OT;Supervision/Assistance - 24 hour    Equipment Recommendations  None recommended by OT    Recommendations for Other Services      Precautions / Restrictions Precautions Precaution Comments: Watch O2       Mobility Bed Mobility Overal bed mobility: Modified Independent             General bed mobility comments: no difficulty, HOB up  Transfers Overall transfer level: Needs assistance Equipment used: None Transfers: Sit to/from Stand Sit to Stand: Supervision         General transfer comment: for safety, 02 line    Balance                                           ADL either performed or assessed with clinical judgement   ADL Overall ADL's : Needs assistance/impaired     Grooming: Supervision/safety;Standing           Upper Body Dressing : Set up;Standing       Toilet Transfer: Supervision/safety;Ambulation   Toileting- Clothing Manipulation and Hygiene: Supervision/safety       Functional mobility during ADLs: Supervision/safety(ambulated in halls with assist to manage 02 tank) General ADL Comments: Pt educated at length in energy conservation and reinforced with handout. Pt also instructed in pursed lip  breathing as he ambulated.     Vision       Perception     Praxis      Cognition Arousal/Alertness: Awake/alert Behavior During Therapy: WFL for tasks assessed/performed Overall Cognitive Status: Within Functional Limits for tasks assessed                                          Exercises     Shoulder Instructions       General Comments      Pertinent Vitals/ Pain       Pain Assessment: No/denies pain  Home Living                                          Prior Functioning/Environment              Frequency  Min 2X/week        Progress Toward Goals  OT Goals(current goals can now be found in the care plan section)  Progress towards OT goals: Progressing toward goals  Acute Rehab OT Goals Patient Stated Goal: to be able to breathe better OT Goal Formulation: With patient Time For Goal Achievement: 02/15/17 Potential to Achieve  Goals: Good  Plan Discharge plan remains appropriate    Co-evaluation                 AM-PAC PT "6 Clicks" Daily Activity     Outcome Measure   Help from another person eating meals?: None Help from another person taking care of personal grooming?: A Little Help from another person toileting, which includes using toliet, bedpan, or urinal?: A Little Help from another person bathing (including washing, rinsing, drying)?: A Little Help from another person to put on and taking off regular upper body clothing?: None Help from another person to put on and taking off regular lower body clothing?: A Little 6 Click Score: 20    End of Session Equipment Utilized During Treatment: Oxygen;Gait belt  OT Visit Diagnosis: Other abnormalities of gait and mobility (R26.89);Muscle weakness (generalized) (M62.81)   Activity Tolerance Patient tolerated treatment well   Patient Left in bed;with call bell/phone within reach   Nurse Communication          Time: 7846-9629 OT Time Calculation  (min): 26 min  Charges: OT General Charges $OT Visit: 1 Visit OT Treatments $Self Care/Home Management : 23-37 mins  02/03/2017 Nestor Lewandowsky, OTR/L Pager: [phone]   Werner Lean, Haze Boyden 02/03/2017, 2:30 PM

## 2017-02-03 NOTE — Plan of Care (Signed)
VSS and within normal limits. Patient remains in normal sinus rhythm with no complaints of pain or signs or symptoms of distress.

## 2017-02-03 NOTE — Progress Notes (Signed)
PROGRESS NOTE  Ronald Bell XHB:[id number] DOB: 1940-06-19 DOA: 01/31/2017 PCP: Ann Held, DO  HPI/Recap of past 24 hours:  Ronald Bell is a 76 y.o. year old male with medical history significant for severe aortic stenosis s/p TAVR (10/2014), paroxysmal atrial fibrillation ( no AC b/c recurrent GIB), CAD ( PCI ) complicated by GIB secondary to multiple AVMs,  COPD/pulmonary fibrosis who presented on 01/31/2017 with and was found to have acute on chronic hypoxic, hypercarbic respiratory failure secondary to COPD exacerbation and CHF exacerbation. Initally admitted to Step down given neede for BiPAP in addition to IV diuresis and scheduled inhaler therapy. Cardiology was consulted given troponin elevated to greater than 6 but no acute ischemic changes on EKG in addition to elevated BNP in setting of worsening pulmonary edema. Pulmonary recommended continued treatment of COPD with levaquin and scheduled nebulizer regimen upon initial admission to stepdown unit.   In the past 24 hours, continues to deny any chest pain, abdominal pain, nausea or vomiting.   This morning he states significant improvement in his breathing though he is still having some trouble bringing up sputum. He is hesitant to go home today. Denies any chest pain, fevers or chills.  Assessment/Plan: Principal Problem:   Acute on chronic respiratory failure (HCC) Active Problems:   Essential hypertension   GERD   Aortic stenosis   S/P TAVR (transcatheter aortic valve replacement)   Paroxysmal atrial fibrillation (HCC)   Acute on chronic diastolic HF (heart failure) (HCC)   Acute exacerbation of chronic obstructive pulmonary disease (COPD) (HCC)   ETOH abuse   Hyperglycemia   Abnormal EKG   Acute pulmonary edema (HCC)   Hypoxemia   Acute congestive heart failure (HCC)  Acute on chronic respiratory failure with hypoxia and hypercapnia. Improving. Multifactorial etiology Decompensated CHF  COPD -Continue medication regimen for CHF and COPD mentioned below  Acute on chronic steroid dependent COPD exacerbation. Improving.  On admission required BiPAP but has improved significantly on nebulizer therapy.  Improved interstitial edema on CXR from 11/11 in comparison to admission - Levaquin, continue for 5 days - Deescalate from IV Solumedrol to oral Prednisone to complete in 2 days - mucinex  Chronic diastolic CHF with new reduced ejection fraction. Improving volume status. TTE shows new reduction in ejection fraction -Awaiting Cardiology recs: Anticipate optimizing medically for systolic heart failure: ARB (cough with lisinopril in past), (already on lasix and BB) -continue oral lasix and bisoprolol - add daily weights, fluid restriction, low sodium diet  Elevated troponin. Repeat TTE shows apical akinesis and new reduction in ejection fraction. Concerning for possible ischemic event ( patient continues to be without chest pai) History of  BMS to mid RCA (09/2014). -Cardiology following will await recommendations - continue aspirin   Paroxsymal Atrial Fibrillation. Rate controlled. On amiodarone in the past.  No anticoagulation given history of prior GIB.  Aortic Stenosis s/p TAVR (2016). Mean gradient 34 mm HG (previous in 07/2016: 16 mmHg) valve area 0.7 ( previous in 5/201: 1.29) -Await cardiology recommendations  Ethanol Abuse. Stable. .  -CIWA protocol, prn lorazepam  Code Status: Full Code  Family Communication: None at bedside   Disposition Plan: pending cardiology recommendations regarding TTE, possible discharge on 11/15 pending continued improvement with inhaler therapy as well.    Consultants:  Cardiology   Procedures:  TTE 02/03/17:  EF 35-40%, Apical akinesis, Aortic valve mean gradient : 37 mmHg, valve area 0.7  Antimicrobials:   Levaquin 11/11-Current  Cultures:  None  DVT prophylaxis:  Lovenox   Objective: Vitals:   02/03/17 0800 02/03/17  1144 02/03/17 1200 02/03/17 1520  BP: 124/88 118/88  110/66  Pulse: 97  85 85  Resp:   20 (!) 21  Temp:  97.8 F (36.6 C)  98.3 F (36.8 C)  TempSrc:    Oral  SpO2:   98% 95%  Weight:      Height:        Intake/Output Summary (Last 24 hours) at 02/03/2017 1559 Last data filed at 02/03/2017 1300 Gross per 24 hour  Intake 480 ml  Output 750 ml  Net -270 ml   Filed Weights   02/01/17 0343 02/02/17 0649 02/03/17 0404  Weight: 67 kg (147 lb 12.8 oz) 66 kg (145 lb 9.6 oz) 64.9 kg (143 lb)    Exam:   General:  Sitting in bedside chair comfortably   Respiratory: no wheezing or rales, no accessory muscle use  Abdomen: soft, non-distended, non-tender, normal bowel sounds   Skin: clean, dry, intact  Neurologic:alert and oriented x3  Psychiatry: normal affect and mood    Data Reviewed: CBC: Recent Labs  Lab 01/31/17 0941 02/01/17 0009 02/02/17 0520 02/03/17 1051  WBC 16.8* 13.8* 14.0* 12.8*  NEUTROABS 11.2*  --  12.3*  --   HGB 13.8 13.0 13.5 14.6  HCT 43.0 39.2 41.9 43.9  MCV 99.3 98.0 99.5 98.0  PLT 213 160 171 188   Basic Metabolic Panel: Recent Labs  Lab 01/31/17 0941 02/01/17 0009 02/02/17 0520 02/03/17 1051  NA 136 139 140 136  K 4.3 3.8 4.4 4.2  CL 102 101 101 96*  CO2 25 27 29 30   GLUCOSE 299* 144* 110* 144*  BUN 11 16 27* 31*  CREATININE 1.07 1.22 1.38* 1.43*  CALCIUM 9.1 9.2 9.6 9.4   GFR: Estimated Creatinine Clearance: 32.9 mL/min (A) (by C-G formula based on SCr of 1.43 mg/dL (H)). Liver Function Tests: Recent Labs  Lab 02/01/17 0009  AST 60*  ALT 35  ALKPHOS 27*  BILITOT 0.8  PROT 7.0  ALBUMIN 3.7   No results for input(s): LIPASE, AMYLASE in the last 168 hours. No results for input(s): AMMONIA in the last 168 hours. Coagulation Profile: Recent Labs  Lab 02/01/17 0009  INR 1.19   Cardiac Enzymes: Recent Labs  Lab 01/31/17 1208 01/31/17 1822 02/01/17 0009  TROPONINI 0.22* 6.34* 6.04*   BNP (last 3 results) No  results for input(s): PROBNP in the last 8760 hours. HbA1C: No results for input(s): HGBA1C in the last 72 hours. CBG: Recent Labs  Lab 02/02/17 0803 02/02/17 1629 02/02/17 2043 02/03/17 0739 02/03/17 1140  GLUCAP 116* 118* 167* 161* 116*   Lipid Profile: No results for input(s): CHOL, HDL, LDLCALC, TRIG, CHOLHDL, LDLDIRECT in the last 72 hours. Thyroid Function Tests: No results for input(s): TSH, T4TOTAL, FREET4, T3FREE, THYROIDAB in the last 72 hours. Anemia Panel: No results for input(s): VITAMINB12, FOLATE, FERRITIN, TIBC, IRON, RETICCTPCT in the last 72 hours. Urine analysis:    Component Value Date/Time   COLORURINE YELLOW 11/16/2014 [place] 11/16/2014 1058   LABSPEC 1.015 11/16/2014 1058   PHURINE 6.5 11/16/2014 1058   GLUCOSEU NEGATIVE 11/16/2014 1058   HGBUR NEGATIVE 11/16/2014 1058   HGBUR negative 11/30/2008 0000   BILIRUBINUR neg 05/09/2015 1106   KETONESUR NEGATIVE 11/16/2014 1058   PROTEINUR neg 05/09/2015 1106   PROTEINUR NEGATIVE 11/16/2014 1058   UROBILINOGEN 0.2 05/09/2015 1106   UROBILINOGEN 0.2 11/16/2014 1058   NITRITE  neg 05/09/2015 1106   NITRITE NEGATIVE 11/16/2014 1058   LEUKOCYTESUR Negative 05/09/2015 1106   Sepsis Labs: @LABRCNTIP (procalcitonin:4,lacticidven:4)  ) Recent Results (from the past 240 hour(s))  MRSA PCR Screening     Status: None   Collection Time: 01/31/17  3:44 PM  Result Value Ref Range Status   MRSA by PCR NEGATIVE NEGATIVE Final    Comment:        The GeneXpert MRSA Assay (FDA approved for NASAL specimens only), is one component of a comprehensive MRSA colonization surveillance program. It is not intended to diagnose MRSA infection nor to guide or monitor treatment for MRSA infections.       Studies: No results found.  Scheduled Meds: . aspirin EC  81 mg Oral Daily  . atorvastatin  40 mg Oral q1800  . bisoprolol  2.5 mg Oral Daily  . citalopram  20 mg Oral Daily  . docusate sodium   100 mg Oral BID  . enoxaparin (LOVENOX) injection  40 mg Subcutaneous Q24H  . famotidine  20 mg Oral QHS  . fenofibrate  160 mg Oral Q supper  . ferrous sulfate  325 mg Oral BID  . folic acid  1 mg Oral Daily  . furosemide  80 mg Oral BID  . guaiFENesin  600 mg Oral BID  . insulin aspart  0-15 Units Subcutaneous TID WC  . insulin aspart  0-5 Units Subcutaneous QHS  . ipratropium-albuterol  3 mL Nebulization TID  . levofloxacin  500 mg Oral Daily  . multivitamin with minerals  1 tablet Oral Daily  . potassium chloride  40 mEq Oral BID  . predniSONE  40 mg Oral Q breakfast  . thiamine  100 mg Oral Daily    Continuous Infusions:   LOS: 3 days     Desiree Hane, MD [facility] Pager [phone]  If 7PM-7AM, please contact night-coverage [email or web address] Password [email or web address] 02/03/2017, 3:59 PM

## 2017-02-03 NOTE — Progress Notes (Signed)
  Echocardiogram 2D Echocardiogram has been performed.  Jachin Coury G Valeri Sula 02/03/2017, 11:32 AM

## 2017-02-03 NOTE — Plan of Care (Signed)
Pt had bowel movement 02/02/17. Reports that this BM was regular and normal for him

## 2017-02-04 DIAGNOSIS — I5043 Acute on chronic combined systolic (congestive) and diastolic (congestive) heart failure: Secondary | ICD-10-CM

## 2017-02-04 DIAGNOSIS — F1010 Alcohol abuse, uncomplicated: Secondary | ICD-10-CM

## 2017-02-04 LAB — GLUCOSE, CAPILLARY
GLUCOSE-CAPILLARY: 96 mg/dL (ref 65–99)
GLUCOSE-CAPILLARY: 160 mg/dL — AB (ref 65–99)
GLUCOSE-CAPILLARY: 157 mg/dL — AB (ref 65–99)
GLUCOSE-CAPILLARY: 137 mg/dL — AB (ref 65–99)

## 2017-02-04 MED ORDER — SODIUM CHLORIDE 0.9% FLUSH
3.0000 mL | Freq: Two times a day (BID) | INTRAVENOUS | Status: DC
  Administered 2017-02-04: 3 mL via INTRAVENOUS

## 2017-02-04 MED ORDER — PREDNISONE 20 MG PO TABS
40.0000 mg | ORAL_TABLET | Freq: Every day | ORAL | Status: AC
  Administered 2017-02-04 – 2017-02-05 (×2): 40 mg via ORAL
  Filled 2017-02-04 (×2): qty 2

## 2017-02-04 MED ORDER — SODIUM CHLORIDE 0.9 % IV SOLN
INTRAVENOUS | Status: DC
  Administered 2017-02-05: 05:00:00 via INTRAVENOUS

## 2017-02-04 MED ORDER — ASPIRIN 81 MG PO CHEW
81.0000 mg | CHEWABLE_TABLET | ORAL | Status: AC
  Administered 2017-02-05: 81 mg via ORAL
  Filled 2017-02-04: qty 1

## 2017-02-04 MED ORDER — SODIUM CHLORIDE 0.9% FLUSH: 3.0000 mL | INTRAVENOUS | Status: DC | PRN

## 2017-02-04 MED ORDER — BENZONATATE 100 MG PO CAPS
100.0000 mg | ORAL_CAPSULE | Freq: Two times a day (BID) | ORAL | Status: DC
  Administered 2017-02-04 – 2017-02-08 (×9): 100 mg via ORAL
  Filled 2017-02-04 (×9): qty 1

## 2017-02-04 MED ORDER — SODIUM CHLORIDE 0.9 % IV SOLN: 250.0000 mL | INTRAVENOUS | Status: DC | PRN

## 2017-02-04 NOTE — Care Management Note (Signed)
Case Management Note  Patient Details  Name: Ronald Bell MRN: [id number] Date of Birth: 1940/07/04  Subjective/Objective:                 Spoke w patient at the bedside. He states he is from home w his wife. He has walker and cane at home. He uses O2 at night through [facility]. Per Yvone Neu PT he maintained his sats while walking. Patient is agreeable to [facility], and would like to use [facility] since they supply his O2.    Action/Plan:  Needs- Orders for [facility] and referral placed to [facility].  Expected Discharge Date:                  Expected Discharge Plan:  [place]  In-House Referral:     Discharge planning Services  CM Consult  Post Acute Care Choice:    Choice offered to:     DME Arranged:    DME Agency:     HH Arranged:    [place] Agency:  [place]  Status of Service:  In process, will continue to follow  If discussed at Long Length of Stay Meetings, dates discussed:    Additional Comments:  Carles Collet, RN 02/04/2017, 11:13 AM

## 2017-02-04 NOTE — Care Management Important Message (Signed)
Important Message  Patient Details  Name: Ronald Bell MRN: [id number] Date of Birth: 06-17-1940   Medicare Important Message Given:  Yes    Trella Thurmond 02/04/2017, 10:30 AM

## 2017-02-04 NOTE — Progress Notes (Signed)
Physical Therapy Treatment Patient Details Name: Ronald Bell MRN: [id number] DOB: 29-Apr-1940 Today's Date: 02/04/2017    History of Present Illness Pt is a 76 y.o. male who presented to the ED with wheeing and significant worsening of shortness of breath. 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     PT Comments    Pt improving notably.  Emphasis on standing exercise, gait stability/stamina and stairs, all while monitoring sats and EHR.   Follow Up Recommendations  Home health PT     Equipment Recommendations  None recommended by PT    Recommendations for Other Services       Precautions / Restrictions Precautions Precautions: Fall Precaution Comments: Watch O2 Restrictions Weight Bearing Restrictions: No    Mobility  Bed Mobility Overal bed mobility: Modified Independent                Transfers Overall transfer level: Needs assistance Equipment used: None Transfers: Sit to/from Stand Sit to Stand: Modified independent (Device/Increase time)            Ambulation/Gait Ambulation/Gait assistance: Modified independent (Device/Increase time);Supervision(mod I in the room) Ambulation Distance (Feet): 250 Feet Assistive device: None Gait Pattern/deviations: Step-through pattern   Gait velocity interpretation: Below normal speed for age/gender General Gait Details: steady overall, able to scan and speed up significantly, the fatigues more quickly having sped up.   Stairs Stairs: Yes   Stair Management: One rail Right;Alternating pattern;Forwards Number of Stairs: 3 General stair comments: safe on the stairs holding the rail  Wheelchair Mobility    Modified Rankin (Stroke Patients Only)       Balance     Sitting balance-Leahy Scale: Good       Standing  balance-Leahy Scale: Good                              Cognition Arousal/Alertness: Awake/alert Behavior During Therapy: WFL for tasks assessed/performed Overall Cognitive Status: Within Functional Limits for tasks assessed                                        Exercises General Exercises - Lower Extremity Hip ABduction/ADduction: AROM;Strengthening;Both;10 reps;Standing Hip Flexion/Marching: AROM;Strengthening;Both;10 reps;Standing Toe Raises: AROM;Both;10 reps;Standing Heel Raises: AROM;Both;10 reps;Standing Mini-Sqauts: AROM;Strengthening;10 reps;Standing    General Comments General comments (skin integrity, edema, etc.): Sats/HR--  At rest 94-96% at 92 bpm.  With standing exercise, 92% at 96 bpm on RA.  During gait on RA sats 95% and EHR generally at upper 90's to 100's, but with push for speed, EHR spiked briefly in the 130's bpm.  Cool down period, sats 95% and EHR 98 bpm.      Pertinent Vitals/Pain Pain Assessment: No/denies pain    Home Living                      Prior Function            PT Goals (current goals can now be found in the care plan section) Acute Rehab PT Goals Patient Stated Goal: to be able to breathe better PT Goal Formulation: With patient Time For Goal Achievement: 02/08/17 Potential to Achieve Goals: Good Progress towards PT goals: Progressing toward goals  Frequency    Min 3X/week      PT Plan Current plan remains appropriate    Co-evaluation              AM-PAC PT "6 Clicks" Daily Activity  Outcome Measure  Difficulty turning over in bed (including adjusting bedclothes, sheets and blankets)?: None Difficulty moving from lying on back to sitting on the side of the bed? : None Difficulty sitting down on and standing up from a chair with arms (e.g., wheelchair, bedside commode, etc,.)?: None Help needed moving to and from a bed to chair (including a wheelchair)?: None Help needed  walking in hospital room?: A Little Help needed climbing 3-5 steps with a railing? : A Little 6 Click Score: 22    End of Session   Activity Tolerance: Patient tolerated treatment well     PT Visit Diagnosis: Difficulty in walking, not elsewhere classified (R26.2);Other abnormalities of gait and mobility (R26.89)     Time: 8916-9450 PT Time Calculation (min) (ACUTE ONLY): 20 min  Charges:  $Gait Training: 8-22 mins                    G Codes:       March 02, 2017  Donnella Sham, PT [phone]  (pager)   Tessie Fass Trysten Berti 2017-03-02, 12:21 PM

## 2017-02-04 NOTE — Progress Notes (Signed)
Progress Note  Patient Name: Ronald Bell Date of Encounter: 02/04/2017  Primary Cardiologist:    Dr. Marlou Porch  Subjective   No chest pain.  He is not breathing as well this morning as he was yesterday.   Inpatient Medications    Scheduled Meds: . aspirin EC  81 mg Oral Daily  . atorvastatin  40 mg Oral q1800  . benzonatate  100 mg Oral BID  . bisoprolol  2.5 mg Oral Daily  . citalopram  20 mg Oral Daily  . docusate sodium  100 mg Oral BID  . enoxaparin (LOVENOX) injection  40 mg Subcutaneous Q24H  . famotidine  20 mg Oral QHS  . fenofibrate  160 mg Oral Q supper  . ferrous sulfate  325 mg Oral BID  . folic acid  1 mg Oral Daily  . furosemide  80 mg Oral BID  . guaiFENesin  600 mg Oral BID  . insulin aspart  0-15 Units Subcutaneous TID WC  . insulin aspart  0-5 Units Subcutaneous QHS  . ipratropium-albuterol  3 mL Nebulization TID  . LORazepam  0-4 mg Intravenous Q12H  . multivitamin with minerals  1 tablet Oral Daily  . potassium chloride  40 mEq Oral BID  . predniSONE  40 mg Oral Q breakfast  . thiamine  100 mg Oral Daily   Continuous Infusions:  PRN Meds: acetaminophen **OR** acetaminophen, guaiFENesin, ondansetron **OR** ondansetron (ZOFRAN) IV, temazepam   Vital Signs    Vitals:   02/04/17 0002 02/04/17 0429 02/04/17 0744 02/04/17 0958  BP: (!) 95/50 (!) 110/53 102/61   Pulse: 80 81 80 83  Resp: (!) 25 20 (!) 21 16  Temp: 97.7 F (36.5 C) (!) 97.3 F (36.3 C) 98 F (36.7 C)   TempSrc: Oral Oral Oral   SpO2: 96% 99% 98% 92%  Weight:  142 lb 4.8 oz (64.5 kg)    Height:        Intake/Output Summary (Last 24 hours) at 02/04/2017 1206 Last data filed at 02/04/2017 0740 Gross per 24 hour  Intake 1080 ml  Output 1450 ml  Net -370 ml   Filed Weights   02/02/17 0649 02/03/17 0404 02/04/17 0429  Weight: 145 lb 9.6 oz (66 kg) 143 lb (64.9 kg) 142 lb 4.8 oz (64.5 kg)    Telemetry    NSR - Personally Reviewed  ECG    NA - Personally  Reviewed  Physical Exam   GEN: No acute distress.   Neck: No  JVD Cardiac: RRR, soft apical systolic murmur, no diastolic murmurs, rubs, or gallops.  Respiratory:   Decreased breath sounds GI: Soft, nontender, non-distended  MS: No  edema; No deformity. Neuro:  Nonfocal  Psych: Normal affect   Labs    Chemistry Recent Labs  Lab 02/01/17 0009 02/02/17 0520 02/03/17 1051  NA 139 140 136  K 3.8 4.4 4.2  CL 101 101 96*  CO2 27 29 30   GLUCOSE 144* 110* 144*  BUN 16 27* 31*  CREATININE 1.22 1.38* 1.43*  CALCIUM 9.2 9.6 9.4  PROT 7.0  --   --   ALBUMIN 3.7  --   --   AST 60*  --   --   ALT 35  --   --   ALKPHOS 27*  --   --   BILITOT 0.8  --   --   GFRNONAA 55* 47* 45*  GFRAA >60 55* 53*  ANIONGAP 11 10 10      Hematology  Recent Labs  Lab 02/01/17 0009 02/02/17 0520 02/03/17 1051  WBC 13.8* 14.0* 12.8*  RBC 4.00* 4.21* 4.48  HGB 13.0 13.5 14.6  HCT 39.2 41.9 43.9  MCV 98.0 99.5 98.0  MCH 32.5 32.1 32.6  MCHC 33.2 32.2 33.3  RDW 14.0 14.3 13.7  PLT 160 171 182    Cardiac Enzymes Recent Labs  Lab 01/31/17 1208 01/31/17 1822 02/01/17 0009  TROPONINI 0.22* 6.34* 6.04*    Recent Labs  Lab 01/31/17 0951  TROPIPOC 0.04     BNP Recent Labs  Lab 01/31/17 1055  BNP 986.0*     DDimer No results for input(s): DDIMER in the last 168 hours.   Radiology    No results found.  Cardiac Studies   ECHO  02/03/17  Left ventricle: The cavity size was normal. Wall thickness was   normal. Systolic function was moderately reduced. The estimated   ejection fraction was in the range of 35% to 40%. Akinesis of the   apical myocardium. Doppler parameters are consistent with   abnormal left ventricular relaxation (grade 1 diastolic   dysfunction). - Aortic valve: A bioprosthesis was present. Valve area (VTI): 0.7   cm^2. Valve area (Vmax): 0.72 cm^2. Valve area (Vmean): 0.67   cm^2. - Mitral valve: There was mild regurgitation. - Left atrium: The atrium was  mildly dilated.   Patient Profile     76 y.o. male w/ hx AS>>TAVR 2016, RCA stent 2016, COPD on home O2, HTN, HLD, PAD, GERD, intestinal angiodysplasia, was admitted 11/11 w/ resp failure, rx w/ nebs and Lasix.   Assessment & Plan    ACUTE ON CHRONIC DIASTOLIC HF:   Down 2.6 liters.   His creat is up slightly.    See below.    AKI:   Needs cardiac cath.  I will stop the Lasix today.    ELEVATED TROPONIN:    Elevated troponin with reduced EF.    Needs cath.  The patient understands that risks included but are not limited to stroke (1 in 1000), death (1 in 39), kidney failure [usually temporary] (1 in 500), bleeding (1 in 200), allergic reaction [possibly serious] (1 in 200).  The patient understands and agrees to proceed.    AS/TAVR:  Mean gradient is increased with decreased EF.    Right heart with left cath above.    PAF:  NSR.  RESPIRATORY FAILURE:  Continue therapy per primary team.    For questions or updates, please contact [place] Please consult [email or web address] for contact info under Cardiology/STEMI.   Signed, Minus Breeding, MD  02/04/2017, 12:06 PM

## 2017-02-04 NOTE — Progress Notes (Signed)
PROGRESS NOTE  Ronald Bell QAS:[id number] DOB: [birth year] DOA: 01/31/2017 PCP: Ann Held, DO  HPI/Recap of past 24 hours:  Ronald Bell is a 76 y.o. year old male with medical history significant for severe aortic stenosis s/p TAVR (10/2014), paroxysmal atrial fibrillation ( no AC b/c recurrent GIB), CAD ( PCI ) complicated by GIB secondary to multiple AVMs,  COPD/pulmonary fibrosis who presented on 01/31/2017 with and was found to have acute on chronic hypoxic, hypercarbic respiratory failure secondary to COPD exacerbation and CHF exacerbation. Initally admitted to Step down given neede for BiPAP in addition to IV diuresis and scheduled inhaler therapy. Cardiology was consulted given troponin elevated to greater than 6 but no acute ischemic changes on EKG in addition to elevated BNP in setting of worsening pulmonary edema. Pulmonary recommended continued treatment of COPD with levaquin and scheduled nebulizer regimen upon initial admission to stepdown unit.   In the past 24 hours, continues to deny any chest pain, abdominal pain, nausea or vomiting.  Breathing has not still feels his breathing has not improved significantly.  Denies any chest pain, fevers or chills.  Assessment/Plan: Principal Problem:   Acute on chronic respiratory failure (HCC) Active Problems:   Essential hypertension   GERD   Aortic stenosis   S/P TAVR (transcatheter aortic valve replacement)   Paroxysmal atrial fibrillation (HCC)   Acute on chronic diastolic HF (heart failure) (HCC)   Acute exacerbation of chronic obstructive pulmonary disease (COPD) (HCC)   ETOH abuse   Hyperglycemia   Abnormal EKG   Acute pulmonary edema (HCC)   Hypoxemia   Acute congestive heart failure (HCC)  Acute on chronic respiratory failure with hypoxia and hypercapnia. Improving. Multifactorial etiology Decompensated CHF and COPD Exacerbation -Continue medication regimen for CHF and COPD mentioned  below  Acute on chronic steroid dependent COPD exacerbation. Improving.  On admission required BiPAP but has improved significantly on nebulizer therapy.  Improved interstitial edema on CXR from 11/11 in comparison to admission - Levaquin, continue for 5 days total - Oral Prednisone to complete in 1days - mucinex  Chronic diastolic CHF with new reduced ejection fraction.  Dry on exam.  70 soft blood pressure slight increase in creatinine good overall diuresis. -Discontinue Lasix -Ischemic evaluation for reduced EF, cardiology recommends right and left heart cath-continue oral lasix and bisoprolol - Daily weights, fluid restriction, low sodium diet  Elevated troponin, likely NSTEMI. Repeat TTE shows apical akinesis and new reduction in ejection fraction. Concerning for possible ischemic event ( patient continues to be without chest pain) History of  BMS to mid RCA (09/2014). -Cardiology recommends left heart catheter as mentioned above - continue aspirin   Paroxsymal Atrial Fibrillation. Rate controlled. On amiodarone in the past.  No anticoagulation given history of prior GIB.  Aortic Stenosis s/p TAVR (2016). Mean gradient 34 mm HG (previous in 07/2016: 16 mmHg) valve area 0.7 ( previous in 5/201: 1.29) -Cardiology recommendations: Left and right heart cath  Ethanol Abuse. Stable. .  -CIWA protocol, prn lorazepam  Code Status: Full Code  Family Communication: None at bedside   Disposition Plan: Left and Right heart cath given new reduction in EF (and apical hypokinesis)  Consultants:  Cardiology   Procedures:  TTE 02/03/17:  EF 35-40%, Apical akinesis, Aortic valve mean gradient : 37 mmHg, valve area 0.7  Antimicrobials:   Levaquin 11/11-Current  Cultures:  None  DVT prophylaxis:  Lovenox   Objective: Vitals:   02/04/17 0429 02/04/17 0744 02/04/17 0958 02/04/17  1226  BP: (!) 110/53 102/61  98/64  Pulse: 81 80 83 82  Resp: 20 (!) 21 16 (!) 21  Temp: (!) 97.3 F  (36.3 C) 98 F (36.7 C)  97.9 F (36.6 C)  TempSrc: Oral Oral  Oral  SpO2: 99% 98% 92% 98%  Weight: 64.5 kg (142 lb 4.8 oz)     Height:        Intake/Output Summary (Last 24 hours) at 02/04/2017 1313 Last data filed at 02/04/2017 0740 Gross per 24 hour  Intake 1080 ml  Output 950 ml  Net 130 ml   Filed Weights   02/02/17 0649 02/03/17 0404 02/04/17 0429  Weight: 66 kg (145 lb 9.6 oz) 64.9 kg (143 lb) 64.5 kg (142 lb 4.8 oz)    Exam:   General:  Sitting in bedside chair comfortably   Respiratory: no wheezing or rales, no accessory muscle use  Abdomen: soft, non-distended, non-tender, normal bowel sounds   Skin: clean, dry, intact  Neurologic:alert and oriented x3  Psychiatry: normal affect and mood    Data Reviewed: CBC: Recent Labs  Lab 01/31/17 0941 02/01/17 0009 02/02/17 0520 02/03/17 1051  WBC 16.8* 13.8* 14.0* 12.8*  NEUTROABS 11.2*  --  12.3*  --   HGB 13.8 13.0 13.5 14.6  HCT 43.0 39.2 41.9 43.9  MCV 99.3 98.0 99.5 98.0  PLT 213 160 171 568   Basic Metabolic Panel: Recent Labs  Lab 01/31/17 0941 02/01/17 0009 02/02/17 0520 02/03/17 1051  NA 136 139 140 136  K 4.3 3.8 4.4 4.2  CL 102 101 101 96*  CO2 25 27 29 30   GLUCOSE 299* 144* 110* 144*  BUN 11 16 27* 31*  CREATININE 1.07 1.22 1.38* 1.43*  CALCIUM 9.1 9.2 9.6 9.4   GFR: Estimated Creatinine Clearance: 32.9 mL/min (A) (by C-G formula based on SCr of 1.43 mg/dL (H)). Liver Function Tests: Recent Labs  Lab 02/01/17 0009  AST 60*  ALT 35  ALKPHOS 27*  BILITOT 0.8  PROT 7.0  ALBUMIN 3.7   No results for input(s): LIPASE, AMYLASE in the last 168 hours. No results for input(s): AMMONIA in the last 168 hours. Coagulation Profile: Recent Labs  Lab 02/01/17 0009  INR 1.19   Cardiac Enzymes: Recent Labs  Lab 01/31/17 1208 01/31/17 1822 02/01/17 0009  TROPONINI 0.22* 6.34* 6.04*   BNP (last 3 results) No results for input(s): PROBNP in the last 8760 hours. HbA1C: No  results for input(s): HGBA1C in the last 72 hours. CBG: Recent Labs  Lab 02/03/17 1140 02/03/17 1602 02/03/17 2122 02/04/17 0731 02/04/17 1119  GLUCAP 116* 170* 147* 96 160*   Lipid Profile: No results for input(s): CHOL, HDL, LDLCALC, TRIG, CHOLHDL, LDLDIRECT in the last 72 hours. Thyroid Function Tests: No results for input(s): TSH, T4TOTAL, FREET4, T3FREE, THYROIDAB in the last 72 hours. Anemia Panel: No results for input(s): VITAMINB12, FOLATE, FERRITIN, TIBC, IRON, RETICCTPCT in the last 72 hours. Urine analysis:    Component Value Date/Time   COLORURINE YELLOW 11/16/2014 [place] 11/16/2014 1058   LABSPEC 1.015 11/16/2014 1058   PHURINE 6.5 11/16/2014 1058   GLUCOSEU NEGATIVE 11/16/2014 1058   HGBUR NEGATIVE 11/16/2014 1058   HGBUR negative 11/30/2008 0000   BILIRUBINUR neg 05/09/2015 1106   KETONESUR NEGATIVE 11/16/2014 1058   PROTEINUR neg 05/09/2015 1106   PROTEINUR NEGATIVE 11/16/2014 1058   UROBILINOGEN 0.2 05/09/2015 1106   UROBILINOGEN 0.2 11/16/2014 1058   NITRITE neg 05/09/2015 1106   NITRITE  NEGATIVE 11/16/2014 1058   LEUKOCYTESUR Negative 05/09/2015 1106   Sepsis Labs: @LABRCNTIP (procalcitonin:4,lacticidven:4)  ) Recent Results (from the past 240 hour(s))  MRSA PCR Screening     Status: None   Collection Time: 01/31/17  3:44 PM  Result Value Ref Range Status   MRSA by PCR NEGATIVE NEGATIVE Final    Comment:        The GeneXpert MRSA Assay (FDA approved for NASAL specimens only), is one component of a comprehensive MRSA colonization surveillance program. It is not intended to diagnose MRSA infection nor to guide or monitor treatment for MRSA infections.       Studies: No results found.  Scheduled Meds: . aspirin EC  81 mg Oral Daily  . atorvastatin  40 mg Oral q1800  . benzonatate  100 mg Oral BID  . bisoprolol  2.5 mg Oral Daily  . citalopram  20 mg Oral Daily  . docusate sodium  100 mg Oral BID  . enoxaparin  (LOVENOX) injection  40 mg Subcutaneous Q24H  . famotidine  20 mg Oral QHS  . fenofibrate  160 mg Oral Q supper  . ferrous sulfate  325 mg Oral BID  . folic acid  1 mg Oral Daily  . guaiFENesin  600 mg Oral BID  . insulin aspart  0-15 Units Subcutaneous TID WC  . insulin aspart  0-5 Units Subcutaneous QHS  . ipratropium-albuterol  3 mL Nebulization TID  . LORazepam  0-4 mg Intravenous Q12H  . multivitamin with minerals  1 tablet Oral Daily  . potassium chloride  40 mEq Oral BID  . predniSONE  40 mg Oral Q breakfast  . thiamine  100 mg Oral Daily    Continuous Infusions:   LOS: 4 days     Desiree Hane, MD [facility] Pager [phone]  If 7PM-7AM, please contact night-coverage [email or web address] Password [email or web address] 02/04/2017, 1:13 PM

## 2017-02-04 NOTE — H&P (View-Only) (Signed)
Progress Note  Patient Name: Ronald Bell Date of Encounter: 02/04/2017  Primary Cardiologist:    Dr. Marlou Porch  Subjective   No chest pain.  He is not breathing as well this morning as he was yesterday.   Inpatient Medications    Scheduled Meds: . aspirin EC  81 mg Oral Daily  . atorvastatin  40 mg Oral q1800  . benzonatate  100 mg Oral BID  . bisoprolol  2.5 mg Oral Daily  . citalopram  20 mg Oral Daily  . docusate sodium  100 mg Oral BID  . enoxaparin (LOVENOX) injection  40 mg Subcutaneous Q24H  . famotidine  20 mg Oral QHS  . fenofibrate  160 mg Oral Q supper  . ferrous sulfate  325 mg Oral BID  . folic acid  1 mg Oral Daily  . furosemide  80 mg Oral BID  . guaiFENesin  600 mg Oral BID  . insulin aspart  0-15 Units Subcutaneous TID WC  . insulin aspart  0-5 Units Subcutaneous QHS  . ipratropium-albuterol  3 mL Nebulization TID  . LORazepam  0-4 mg Intravenous Q12H  . multivitamin with minerals  1 tablet Oral Daily  . potassium chloride  40 mEq Oral BID  . predniSONE  40 mg Oral Q breakfast  . thiamine  100 mg Oral Daily   Continuous Infusions:  PRN Meds: acetaminophen **OR** acetaminophen, guaiFENesin, ondansetron **OR** ondansetron (ZOFRAN) IV, temazepam   Vital Signs    Vitals:   02/04/17 0002 02/04/17 0429 02/04/17 0744 02/04/17 0958  BP: (!) 95/50 (!) 110/53 102/61   Pulse: 80 81 80 83  Resp: (!) 25 20 (!) 21 16  Temp: 97.7 F (36.5 C) (!) 97.3 F (36.3 C) 98 F (36.7 C)   TempSrc: Oral Oral Oral   SpO2: 96% 99% 98% 92%  Weight:  142 lb 4.8 oz (64.5 kg)    Height:        Intake/Output Summary (Last 24 hours) at 02/04/2017 1206 Last data filed at 02/04/2017 0740 Gross per 24 hour  Intake 1080 ml  Output 1450 ml  Net -370 ml   Filed Weights   02/02/17 0649 02/03/17 0404 02/04/17 0429  Weight: 145 lb 9.6 oz (66 kg) 143 lb (64.9 kg) 142 lb 4.8 oz (64.5 kg)    Telemetry    NSR - Personally Reviewed  ECG    NA - Personally  Reviewed  Physical Exam   GEN: No acute distress.   Neck: No  JVD Cardiac: RRR, soft apical systolic murmur, no diastolic murmurs, rubs, or gallops.  Respiratory:   Decreased breath sounds GI: Soft, nontender, non-distended  MS: No  edema; No deformity. Neuro:  Nonfocal  Psych: Normal affect   Labs    Chemistry Recent Labs  Lab 02/01/17 0009 02/02/17 0520 02/03/17 1051  NA 139 140 136  K 3.8 4.4 4.2  CL 101 101 96*  CO2 27 29 30   GLUCOSE 144* 110* 144*  BUN 16 27* 31*  CREATININE 1.22 1.38* 1.43*  CALCIUM 9.2 9.6 9.4  PROT 7.0  --   --   ALBUMIN 3.7  --   --   AST 60*  --   --   ALT 35  --   --   ALKPHOS 27*  --   --   BILITOT 0.8  --   --   GFRNONAA 55* 47* 45*  GFRAA >60 55* 53*  ANIONGAP 11 10 10      Hematology  Recent Labs  Lab 02/01/17 0009 02/02/17 0520 02/03/17 1051  WBC 13.8* 14.0* 12.8*  RBC 4.00* 4.21* 4.48  HGB 13.0 13.5 14.6  HCT 39.2 41.9 43.9  MCV 98.0 99.5 98.0  MCH 32.5 32.1 32.6  MCHC 33.2 32.2 33.3  RDW 14.0 14.3 13.7  PLT 160 171 182    Cardiac Enzymes Recent Labs  Lab 01/31/17 1208 01/31/17 1822 02/01/17 0009  TROPONINI 0.22* 6.34* 6.04*    Recent Labs  Lab 01/31/17 0951  TROPIPOC 0.04     BNP Recent Labs  Lab 01/31/17 1055  BNP 986.0*     DDimer No results for input(s): DDIMER in the last 168 hours.   Radiology    No results found.  Cardiac Studies   ECHO  02/03/17  Left ventricle: The cavity size was normal. Wall thickness was   normal. Systolic function was moderately reduced. The estimated   ejection fraction was in the range of 35% to 40%. Akinesis of the   apical myocardium. Doppler parameters are consistent with   abnormal left ventricular relaxation (grade 1 diastolic   dysfunction). - Aortic valve: A bioprosthesis was present. Valve area (VTI): 0.7   cm^2. Valve area (Vmax): 0.72 cm^2. Valve area (Vmean): 0.67   cm^2. - Mitral valve: There was mild regurgitation. - Left atrium: The atrium was  mildly dilated.   Patient Profile     76 y.o. male w/ hx AS>>TAVR 2016, RCA stent 2016, COPD on home O2, HTN, HLD, PAD, GERD, intestinal angiodysplasia, was admitted 11/11 w/ resp failure, rx w/ nebs and Lasix.   Assessment & Plan    ACUTE ON CHRONIC DIASTOLIC HF:   Down 2.6 liters.   His creat is up slightly.    See below.    AKI:   Needs cardiac cath.  I will stop the Lasix today.    ELEVATED TROPONIN:    Elevated troponin with reduced EF.    Needs cath.  The patient understands that risks included but are not limited to stroke (1 in 1000), death (1 in 45), kidney failure [usually temporary] (1 in 500), bleeding (1 in 200), allergic reaction [possibly serious] (1 in 200).  The patient understands and agrees to proceed.    AS/TAVR:  Mean gradient is increased with decreased EF.    Right heart with left cath above.    PAF:  NSR.  RESPIRATORY FAILURE:  Continue therapy per primary team.    For questions or updates, please contact [place] Please consult [email or web address] for contact info under Cardiology/STEMI.   Signed, Minus Breeding, MD  02/04/2017, 12:06 PM

## 2017-02-05 ENCOUNTER — Encounter (HOSPITAL_COMMUNITY): Payer: Self-pay | Admitting: Cardiovascular Disease

## 2017-02-05 ENCOUNTER — Encounter (HOSPITAL_COMMUNITY)
Admission: EM | Disposition: A | Discharge status: Home Health | Payer: Self-pay | Source: Home / Self Care | Attending: Internal Medicine

## 2017-02-05 DIAGNOSIS — I5023 Acute on chronic systolic (congestive) heart failure: Secondary | ICD-10-CM

## 2017-02-05 HISTORY — PX: RIGHT/LEFT HEART CATH AND CORONARY ANGIOGRAPHY: CATH118266

## 2017-02-05 LAB — POCT I-STAT 3, ART BLOOD GAS (G3+)
ACID-BASE EXCESS: 5 mmol/L — AB (ref 0.0–2.0)
BICARBONATE: 30.6 mmol/L — AB (ref 20.0–28.0)
O2 Saturation: 96 %
TCO2: 32 mmol/L (ref 22–32)
pCO2 arterial: 46.5 mmHg (ref 32.0–48.0)
pH, Arterial: 7.427 (ref 7.350–7.450)
pO2, Arterial: 85 mmHg (ref 83.0–108.0)

## 2017-02-05 LAB — GLUCOSE, CAPILLARY
GLUCOSE-CAPILLARY: 97 mg/dL (ref 65–99)
GLUCOSE-CAPILLARY: 134 mg/dL — AB (ref 65–99)
Glucose-Capillary: 161 mg/dL — ABNORMAL HIGH (ref 65–99)
Glucose-Capillary: 123 mg/dL — ABNORMAL HIGH (ref 65–99)

## 2017-02-05 LAB — CBC
HCT: 39.9 % (ref 39.0–52.0)
Hemoglobin: 12.8 g/dL — ABNORMAL LOW (ref 13.0–17.0)
MCH: 31.2 pg (ref 26.0–34.0)
MCHC: 32.1 g/dL (ref 30.0–36.0)
MCV: 97.3 fL (ref 78.0–100.0)
PLATELETS: 159 10*3/uL (ref 150–400)
RBC: 4.1 MIL/uL — ABNORMAL LOW (ref 4.22–5.81)
RDW: 13.4 % (ref 11.5–15.5)
WBC: 8.6 10*3/uL (ref 4.0–10.5)

## 2017-02-05 LAB — POCT I-STAT 3, VENOUS BLOOD GAS (G3P V)
Acid-Base Excess: 5 mmol/L — ABNORMAL HIGH (ref 0.0–2.0)
Bicarbonate: 31.2 mmol/L — ABNORMAL HIGH (ref 20.0–28.0)
O2 SAT: 62 %
PCO2 VEN: 51.2 mmHg (ref 44.0–60.0)
TCO2: 33 mmol/L — AB (ref 22–32)
pH, Ven: 7.394 (ref 7.250–7.430)
pO2, Ven: 33 mmHg (ref 32.0–45.0)

## 2017-02-05 LAB — BASIC METABOLIC PANEL
Anion gap: 7 (ref 5–15)
BUN: 31 mg/dL — AB (ref 6–20)
CO2: 30 mmol/L (ref 22–32)
CREATININE: 1.27 mg/dL — AB (ref 0.61–1.24)
Calcium: 9.3 mg/dL (ref 8.9–10.3)
Chloride: 99 mmol/L — ABNORMAL LOW (ref 101–111)
GFR calc Af Amer: >60 mL/min (ref >60)
GFR, EST NON AFRICAN AMERICAN: 52 mL/min — AB (ref >60)
Glucose, Bld: 91 mg/dL (ref 65–99)
Potassium: 3.9 mmol/L (ref 3.5–5.1)
SODIUM: 136 mmol/L (ref 135–145)

## 2017-02-05 LAB — PROTIME-INR
INR: 1.13
PROTHROMBIN TIME: 14.5 s (ref 11.4–15.2)

## 2017-02-05 SURGERY — RIGHT/LEFT HEART CATH AND CORONARY ANGIOGRAPHY
Anesthesia: LOCAL

## 2017-02-05 MED ORDER — LIDOCAINE HCL (PF) 1 % IJ SOLN
INTRAMUSCULAR | Status: AC
Start: 2017-02-05 — End: ?
  Filled 2017-02-05: qty 30

## 2017-02-05 MED ORDER — SODIUM CHLORIDE 0.9% FLUSH
3.0000 mL | Freq: Two times a day (BID) | INTRAVENOUS | Status: DC
  Administered 2017-02-05 – 2017-02-08 (×6): 3 mL via INTRAVENOUS

## 2017-02-05 MED ORDER — FENTANYL CITRATE (PF) 100 MCG/2ML IJ SOLN
INTRAMUSCULAR | Status: AC
  Filled 2017-02-05: qty 2

## 2017-02-05 MED ORDER — IOPAMIDOL (ISOVUE-370) INJECTION 76%
INTRAVENOUS | Status: DC | PRN
  Administered 2017-02-05: 40 mL via INTRA_ARTERIAL

## 2017-02-05 MED ORDER — LIDOCAINE HCL (PF) 1 % IJ SOLN
INTRAMUSCULAR | Status: DC | PRN
  Administered 2017-02-05: 20 mL

## 2017-02-05 MED ORDER — HEPARIN (PORCINE) IN NACL 2-0.9 UNIT/ML-% IJ SOLN
INTRAMUSCULAR | Status: AC | PRN
  Administered 2017-02-05: 1000 mL via INTRA_ARTERIAL

## 2017-02-05 MED ORDER — MIDAZOLAM HCL 2 MG/2ML IJ SOLN
INTRAMUSCULAR | Status: AC
  Filled 2017-02-05: qty 2

## 2017-02-05 MED ORDER — FENTANYL CITRATE (PF) 100 MCG/2ML IJ SOLN
INTRAMUSCULAR | Status: DC | PRN
  Administered 2017-02-05: 25 ug via INTRAVENOUS

## 2017-02-05 MED ORDER — SODIUM CHLORIDE 0.9% FLUSH: 3.0000 mL | INTRAVENOUS | Status: DC | PRN

## 2017-02-05 MED ORDER — SODIUM CHLORIDE 0.9 % IV SOLN: 250.0000 mL | INTRAVENOUS | Status: DC | PRN

## 2017-02-05 MED ORDER — HEPARIN (PORCINE) IN NACL 2-0.9 UNIT/ML-% IJ SOLN
INTRAMUSCULAR | Status: AC
  Filled 2017-02-05: qty 1000

## 2017-02-05 MED ORDER — MIDAZOLAM HCL 2 MG/2ML IJ SOLN
INTRAMUSCULAR | Status: DC | PRN
  Administered 2017-02-05: 1 mg via INTRAVENOUS

## 2017-02-05 SURGICAL SUPPLY — 14 items
CATH INFINITI 5FR AL1 (CATHETERS) ×1 IMPLANT
CATH INFINITI 5FR MULTPACK ANG (CATHETERS) ×1 IMPLANT
CATH SWAN GANZ 7F STRAIGHT (CATHETERS) ×1 IMPLANT
KIT HEART LEFT (KITS) ×2 IMPLANT
PACK CARDIAC CATHETERIZATION (CUSTOM PROCEDURE TRAY) ×2 IMPLANT
PINNACLE LONG 5F 25CM (SHEATH) ×2
SHEATH INTROD PINNACLE 5F 25CM (SHEATH) IMPLANT
SHEATH PINNACLE 5F 10CM (SHEATH) ×1 IMPLANT
SHEATH PINNACLE 7F 10CM (SHEATH) ×1 IMPLANT
TRANSDUCER W/STOPCOCK (MISCELLANEOUS) ×2 IMPLANT
TUBING CIL FLEX 10 FLL-RA (TUBING) ×2 IMPLANT
WIRE EMERALD 3MM-J .035X150CM (WIRE) ×1 IMPLANT
WIRE EMERALD ST .035X260CM (WIRE) ×1 IMPLANT
WIRE HI TORQ VERSACORE-J 145CM (WIRE) ×1 IMPLANT

## 2017-02-05 NOTE — Plan of Care (Signed)
Right femoral access site level 0. Patient denies any pain at the sight and remains free from complications

## 2017-02-05 NOTE — Progress Notes (Signed)
PROGRESS NOTE  Ronald Bell JAS:[id number] DOB: 1940-06-07 DOA: 01/31/2017 PCP: Ann Held, DO  HPI/Recap of past 24 hours:  Ronald Bell is a 76 y.o. year old male with medical history significant for severe aortic stenosis s/p TAVR (10/2014), paroxysmal atrial fibrillation ( no AC b/c recurrent GIB), CAD ( PCI ) complicated by GIB secondary to multiple AVMs,  COPD/pulmonary fibrosis who presented on 01/31/2017 with and was found to have acute on chronic hypoxic, hypercarbic respiratory failure secondary to COPD exacerbation and CHF exacerbation. Initally admitted to Step down given neede for BiPAP in addition to IV diuresis and scheduled inhaler therapy. Cardiology was consulted given troponin elevated to greater than 6 but no acute ischemic changes on EKG in addition to elevated BNP in setting of worsening pulmonary edema. Pulmonary recommended continued treatment of COPD with levaquin and scheduled nebulizer regimen upon initial admission to stepdown unit.  Patient's respiratory status has improved with scheduled inhalers, IV steroids which were then transition to oral prednisone.  Given new reduction in ejection fraction, plan is for patient to undergo left and right heart cath today.   Spoke with patient after his left and right heart cath. He knows the valve for his aortic valve is not functioning well.   He was able to walk around the halls with assistance but he feel a little short of breath sitting down.  Assessment/Plan: Principal Problem:   Acute on chronic respiratory failure (HCC) Active Problems:   Essential hypertension   GERD   Aortic stenosis   S/P TAVR (transcatheter aortic valve replacement)   Paroxysmal atrial fibrillation (HCC)   Acute on chronic diastolic HF (heart failure) (HCC)   Acute exacerbation of chronic obstructive pulmonary disease (COPD) (HCC)   ETOH abuse   Hyperglycemia   Abnormal EKG   Acute pulmonary edema (HCC)   Hypoxemia  Acute congestive heart failure (HCC)   Acute on chronic systolic heart failure (HCC)  Acute on chronic respiratory failure with hypoxia and hypercapnia. Improving. Multifactorial etiology Decompensated CHF and COPD Exacerbation -Continue medication regimen for CHF and COPD mentioned below  Acute on chronic steroid dependent COPD exacerbation. Improving.  On admission required BiPAP but has improved significantly on nebulizer therapy.  Improved interstitial edema on CXR from 11/11 in comparison to admission - Levaquin, continue for 5 days total -Completed oral prednisone - mucinex  Chronic diastolic CHF with new reduced ejection fraction, euvolemic.  Down 2.6 L during admission.  Weight 144 lbs (initially on admission 150 lbs). Looks fairly euvolemic today -Ischemic evaluation today negative for any obstructive lesions on LHC -continue bisoprolol - Daily weights, fluid restriction, low sodium diet, monitor Creatinine on BMP in am  Elevated troponin, likely NSTEMI. Repeat TTE shows apical akinesis and new reduction in ejection fraction. LHC shows diffuse nonobstructive disease with no new stenosis -cardiology following - continue aspirin   Paroxsymal Atrial Fibrillation. Rate controlled. On amiodarone in the past.  No anticoagulation given history of prior GIB.  Aortic Stenosis s/p TAVR (2016).LHC today shows progressive valve dysfunction.  -Cardiology following, no further medication management. Careful management of volume status  Ethanol Abuse. Stable. .  -CIWA protocol, prn lorazepam  Code Status: Full Code  Family Communication: None at bedside   Disposition Plan: Discharge home once volume status stable on oral regimen, likely 11/17  Consultants:  Cardiology. Dr. Minus Breeding  Procedures:  TTE 02/03/17:  EF 35-40%, Apical akinesis, Aortic valve mean gradient : 37 mmHg, valve area 0.7 CATH (left  and right) 02/05/17: mild diffuse nonobstructive stenosis. Severe calcific  LAD/first diagonal stenosis unchanged from the previous study in 2016 Severe bioprosthetic aortic valve stenosis with a mean gradient of 40 mmHg and calculated aortic valve area of 0.54 cm    Antimicrobials:   Levaquin 11/11-Current  Cultures:  None  DVT prophylaxis:  Lovenox   Objective: Vitals:   02/05/17 1100 02/05/17 1105 02/05/17 1109 02/05/17 1114  BP: (!) 102/55 111/63 (!) 106/58 118/66  Pulse: 72 (!) 112 75 (!) 0  Resp: 19 17 11  (!) 0  Temp:      TempSrc:      SpO2: 97% 97% 98% 95%  Weight:      Height:        Intake/Output Summary (Last 24 hours) at 02/05/2017 1130 Last data filed at 02/05/2017 3220 Gross per 24 hour  Intake 361.33 ml  Output 400 ml  Net -38.67 ml   Filed Weights   02/03/17 0404 02/04/17 0429 02/05/17 0401  Weight: 64.9 kg (143 lb) 64.5 kg (142 lb 4.8 oz) 65.5 kg (144 lb 6.4 oz)    Exam:   General:  Sitting in bed, in no distress  Respiratory: no wheezing or rales, no accessory muscle use  Cardiovascular: no peripheral edema  Skin: clean, dry, intact  Neurologic:alert and oriented x3  Psychiatry: normal affect and mood    Data Reviewed: CBC: Recent Labs  Lab 01/31/17 0941 02/01/17 0009 02/02/17 0520 02/03/17 1051 02/05/17 0544  WBC 16.8* 13.8* 14.0* 12.8* 8.6  NEUTROABS 11.2*  --  12.3*  --   --   HGB 13.8 13.0 13.5 14.6 12.8*  HCT 43.0 39.2 41.9 43.9 39.9  MCV 99.3 98.0 99.5 98.0 97.3  PLT 213 160 171 182 254   Basic Metabolic Panel: Recent Labs  Lab 01/31/17 0941 02/01/17 0009 02/02/17 0520 02/03/17 1051 02/05/17 0544  NA 136 139 140 136 136  K 4.3 3.8 4.4 4.2 3.9  CL 102 101 101 96* 99*  CO2 25 27 29 30 30   GLUCOSE 299* 144* 110* 144* 91  BUN 11 16 27* 31* 31*  CREATININE 1.07 1.22 1.38* 1.43* 1.27*  CALCIUM 9.1 9.2 9.6 9.4 9.3   GFR: Estimated Creatinine Clearance: 37 mL/min (A) (by C-G formula based on SCr of 1.27 mg/dL (H)). Liver Function Tests: Recent Labs  Lab 02/01/17 0009  AST 60*    ALT 35  ALKPHOS 27*  BILITOT 0.8  PROT 7.0  ALBUMIN 3.7   No results for input(s): LIPASE, AMYLASE in the last 168 hours. No results for input(s): AMMONIA in the last 168 hours. Coagulation Profile: Recent Labs  Lab 02/01/17 0009 02/05/17 0544  INR 1.19 1.13   Cardiac Enzymes: Recent Labs  Lab 01/31/17 1208 01/31/17 1822 02/01/17 0009  TROPONINI 0.22* 6.34* 6.04*   BNP (last 3 results) No results for input(s): PROBNP in the last 8760 hours. HbA1C: No results for input(s): HGBA1C in the last 72 hours. CBG: Recent Labs  Lab 02/04/17 0731 02/04/17 1119 02/04/17 1552 02/04/17 2125 02/05/17 0811  GLUCAP 96 160* 157* 137* 97   Lipid Profile: No results for input(s): CHOL, HDL, LDLCALC, TRIG, CHOLHDL, LDLDIRECT in the last 72 hours. Thyroid Function Tests: No results for input(s): TSH, T4TOTAL, FREET4, T3FREE, THYROIDAB in the last 72 hours. Anemia Panel: No results for input(s): VITAMINB12, FOLATE, FERRITIN, TIBC, IRON, RETICCTPCT in the last 72 hours. Urine analysis:    Component Value Date/Time   COLORURINE YELLOW 11/16/2014 [place] 11/16/2014  1058   LABSPEC 1.015 11/16/2014 1058   PHURINE 6.5 11/16/2014 1058   GLUCOSEU NEGATIVE 11/16/2014 1058   HGBUR NEGATIVE 11/16/2014 1058   HGBUR negative 11/30/2008 0000   BILIRUBINUR neg 05/09/2015 1106   KETONESUR NEGATIVE 11/16/2014 1058   PROTEINUR neg 05/09/2015 1106   PROTEINUR NEGATIVE 11/16/2014 1058   UROBILINOGEN 0.2 05/09/2015 1106   UROBILINOGEN 0.2 11/16/2014 1058   NITRITE neg 05/09/2015 1106   NITRITE NEGATIVE 11/16/2014 1058   LEUKOCYTESUR Negative 05/09/2015 1106   Sepsis Labs: @LABRCNTIP (procalcitonin:4,lacticidven:4)  ) Recent Results (from the past 240 hour(s))  MRSA PCR Screening     Status: None   Collection Time: 01/31/17  3:44 PM  Result Value Ref Range Status   MRSA by PCR NEGATIVE NEGATIVE Final    Comment:        The GeneXpert MRSA Assay (FDA approved for NASAL  specimens only), is one component of a comprehensive MRSA colonization surveillance program. It is not intended to diagnose MRSA infection nor to guide or monitor treatment for MRSA infections.       Studies: No results found.  Scheduled Meds: . [MAR Hold] aspirin EC  81 mg Oral Daily  . [MAR Hold] atorvastatin  40 mg Oral q1800  . [MAR Hold] benzonatate  100 mg Oral BID  . [MAR Hold] bisoprolol  2.5 mg Oral Daily  . [MAR Hold] citalopram  20 mg Oral Daily  . [MAR Hold] docusate sodium  100 mg Oral BID  . [MAR Hold] enoxaparin (LOVENOX) injection  40 mg Subcutaneous Q24H  . [MAR Hold] famotidine  20 mg Oral QHS  . [MAR Hold] fenofibrate  160 mg Oral Q supper  . [MAR Hold] ferrous sulfate  325 mg Oral BID  . [MAR Hold] folic acid  1 mg Oral Daily  . [MAR Hold] guaiFENesin  600 mg Oral BID  . [MAR Hold] insulin aspart  0-15 Units Subcutaneous TID WC  . [MAR Hold] insulin aspart  0-5 Units Subcutaneous QHS  . [MAR Hold] ipratropium-albuterol  3 mL Nebulization TID  . LORazepam  0-4 mg Intravenous Q12H  . [MAR Hold] multivitamin with minerals  1 tablet Oral Daily  . [MAR Hold] potassium chloride  40 mEq Oral BID  . sodium chloride flush  3 mL Intravenous Q12H  . [MAR Hold] thiamine  100 mg Oral Daily    Continuous Infusions: . sodium chloride    . sodium chloride 10 mL/hr at 02/05/17 0512  . heparin       LOS: 5 days     Desiree Hane, MD [facility] Pager [phone]  If 7PM-7AM, please contact night-coverage [email or web address] Password [email or web address] 02/05/2017, 11:30 AM

## 2017-02-05 NOTE — Plan of Care (Signed)
Patient educated on proper skin care techniques to maintain clean, dry skin and promote skin integrity. Verbalizes understanding of education. Skin tear on right elbow covered with steri strips

## 2017-02-05 NOTE — Interval H&P Note (Signed)
History and Physical Interval Note:  02/05/2017 10:36 AM  Ronald Bell  has presented today for surgery, with the diagnosis of cp, sob  The various methods of treatment have been discussed with the patient and family. After consideration of risks, benefits and other options for treatment, the patient has consented to  Procedure(s): RIGHT/LEFT HEART CATH AND CORONARY ANGIOGRAPHY (N/A) as a surgical intervention .  The patient's history has been reviewed, patient examined, no change in status, stable for surgery.  I have reviewed the patient's chart and labs.  Questions were answered to the patient's satisfaction.     Jan[facility]

## 2017-02-05 NOTE — Progress Notes (Signed)
Progress Note  Patient Name: Ronald Bell Date of Encounter: 02/05/2017  Primary Cardiologist:    Dr. Marlou Porch  Subjective   He said his breathing was a little bit better yesterday.  Able to lie flat.  Denies any chest pain.   Inpatient Medications    Scheduled Meds: . aspirin EC  81 mg Oral Daily  . atorvastatin  40 mg Oral q1800  . benzonatate  100 mg Oral BID  . bisoprolol  2.5 mg Oral Daily  . citalopram  20 mg Oral Daily  . docusate sodium  100 mg Oral BID  . enoxaparin (LOVENOX) injection  40 mg Subcutaneous Q24H  . famotidine  20 mg Oral QHS  . fenofibrate  160 mg Oral Q supper  . ferrous sulfate  325 mg Oral BID  . folic acid  1 mg Oral Daily  . guaiFENesin  600 mg Oral BID  . insulin aspart  0-15 Units Subcutaneous TID WC  . insulin aspart  0-5 Units Subcutaneous QHS  . ipratropium-albuterol  3 mL Nebulization TID  . LORazepam  0-4 mg Intravenous Q12H  . multivitamin with minerals  1 tablet Oral Daily  . potassium chloride  40 mEq Oral BID  . sodium chloride flush  3 mL Intravenous Q12H  . thiamine  100 mg Oral Daily   Continuous Infusions: . sodium chloride     PRN Meds: sodium chloride, acetaminophen **OR** acetaminophen, guaiFENesin, ondansetron **OR** ondansetron (ZOFRAN) IV, sodium chloride flush, temazepam   Vital Signs    Vitals:   02/05/17 1200 02/05/17 1215 02/05/17 1230 02/05/17 1300  BP: (!) 104/52 (!) 115/49 (!) 118/48 119/62  Pulse: 70 66 68 72  Resp: 16 18 15  (!) 21  Temp:    98.3 F (36.8 C)  TempSrc:    Oral  SpO2: 90% 100% 99% 100%  Weight:      Height:        Intake/Output Summary (Last 24 hours) at 02/05/2017 1352 Last data filed at 02/05/2017 0512 Gross per 24 hour  Intake 361.33 ml  Output 400 ml  Net -38.67 ml   Filed Weights   02/03/17 0404 02/04/17 0429 02/05/17 0401  Weight: 143 lb (64.9 kg) 142 lb 4.8 oz (64.5 kg) 144 lb 6.4 oz (65.5 kg)    Telemetry    NSR - Personally Reviewed  ECG    NA -  Personally Reviewed  Physical Exam   GEN: No  acute distress but very frail appearing.   Neck: No  JVD Cardiac: RRR, soft apical systolic murmur, no diastolic murmurs, rubs, or gallops.  Respiratory:   Decreased breath sounds without crackles GI: Soft, nontender, non-distended, normal bowel sounds  MS:  Right groin without bleeding or bruising Neuro:   Nonfocal  Psych: Oriented and appropriate   Labs    Chemistry Recent Labs  Lab 02/01/17 0009 02/02/17 0520 02/03/17 1051 02/05/17 0544  NA 139 140 136 136  K 3.8 4.4 4.2 3.9  CL 101 101 96* 99*  CO2 27 29 30 30   GLUCOSE 144* 110* 144* 91  BUN 16 27* 31* 31*  CREATININE 1.22 1.38* 1.43* 1.27*  CALCIUM 9.2 9.6 9.4 9.3  PROT 7.0  --   --   --   ALBUMIN 3.7  --   --   --   AST 60*  --   --   --   ALT 35  --   --   --   ALKPHOS 27*  --   --   --  BILITOT 0.8  --   --   --   GFRNONAA 55* 47* 45* 52*  GFRAA >60 55* 53* >60  ANIONGAP 11 10 10 7      Hematology Recent Labs  Lab 02/02/17 0520 02/03/17 1051 02/05/17 0544  WBC 14.0* 12.8* 8.6  RBC 4.21* 4.48 4.10*  HGB 13.5 14.6 12.8*  HCT 41.9 43.9 39.9  MCV 99.5 98.0 97.3  MCH 32.1 32.6 31.2  MCHC 32.2 33.3 32.1  RDW 14.3 13.7 13.4  PLT 171 182 159    Cardiac Enzymes Recent Labs  Lab 01/31/17 1208 01/31/17 1822 02/01/17 0009  TROPONINI 0.22* 6.34* 6.04*    Recent Labs  Lab 01/31/17 0951  TROPIPOC 0.04     BNP Recent Labs  Lab 01/31/17 1055  BNP 986.0*     DDimer No results for input(s): DDIMER in the last 168 hours.   Radiology    No results found.  Cardiac Studies   ECHO  02/03/17  Left ventricle: The cavity size was normal. Wall thickness was   normal. Systolic function was moderately reduced. The estimated   ejection fraction was in the range of 35% to 40%. Akinesis of the   apical myocardium. Doppler parameters are consistent with   abnormal left ventricular relaxation (grade 1 diastolic   dysfunction). - Aortic valve: A  bioprosthesis was present. Valve area (VTI): 0.7   cm^2. Valve area (Vmax): 0.72 cm^2. Valve area (Vmean): 0.67   cm^2. - Mitral valve: There was mild regurgitation. - Left atrium: The atrium was mildly dilated.  CATH  02/05/17  Patency of the left main, left circumflex, and RCA (including the RCA stented segment) with mild diffuse calcific nonobstructive stenosis Severe calcific LAD/first diagonal stenosis unchanged from the previous study in 2016 Severe bioprosthetic aortic valve stenosis with a mean gradient of 40 mmHg and calculated aortic valve area of 0.54 cm   Patient Profile     76 y.o. male w/ hx AS>>TAVR 2016, RCA stent 2016, COPD on home O2, HTN, HLD, PAD, GERD, intestinal angiodysplasia, was admitted 11/11 w/ resp failure, rx w/ nebs and Lasix.   Assessment & Plan    ACUTE ON CHRONIC DIASTOLIC HF:   Down 2.6 liters.   His creat is up slightly.    Resume Lasix probably in the AM pending his renal function.   AKI:  See above.    ELEVATED TROPONIN:    Medical management as per the results of the cath above.    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  Needs careful volume management.    PAF:  NSR.  RESPIRATORY FAILURE:  Continue therapy per primary team.    For questions or updates, please contact [place] Please consult [email or web address] for contact info under Cardiology/STEMI.   Signed, Minus Breeding, MD  02/05/2017, 1:52 PM

## 2017-02-05 NOTE — Progress Notes (Signed)
22fr venous sheath aspirated and removed from rfv, manual pressure applied for 5 minutes. 49fr arterial sheath aspirated and removed from rfa. Manual pressure applied to both sites for 20 minutes by Scheryl Darter. Groin level 0 tegaderm dressing applied. Bedrest instructions given. Bilateral dp pulses present, palpable, but weak on the left.  Bedrest begins at 12:30:00

## 2017-02-06 ENCOUNTER — Other Ambulatory Visit: Payer: Self-pay | Admitting: Family Medicine

## 2017-02-06 LAB — GLUCOSE, CAPILLARY
GLUCOSE-CAPILLARY: 130 mg/dL — AB (ref 65–99)
Glucose-Capillary: 104 mg/dL — ABNORMAL HIGH (ref 65–99)
Glucose-Capillary: 153 mg/dL — ABNORMAL HIGH (ref 65–99)
Glucose-Capillary: 137 mg/dL — ABNORMAL HIGH (ref 65–99)

## 2017-02-06 LAB — BASIC METABOLIC PANEL
Anion gap: 6 (ref 5–15)
BUN: 23 mg/dL — AB (ref 6–20)
CO2: 26 mmol/L (ref 22–32)
CREATININE: 0.95 mg/dL (ref 0.61–1.24)
Calcium: 9.3 mg/dL (ref 8.9–10.3)
Chloride: 103 mmol/L (ref 101–111)
Glucose, Bld: 95 mg/dL (ref 65–99)
Potassium: 4.6 mmol/L (ref 3.5–5.1)
SODIUM: 135 mmol/L (ref 135–145)

## 2017-02-06 MED ORDER — FUROSEMIDE 40 MG PO TABS
40.0000 mg | ORAL_TABLET | Freq: Two times a day (BID) | ORAL | Status: DC
  Administered 2017-02-06 – 2017-02-07 (×3): 40 mg via ORAL
  Filled 2017-02-06 (×3): qty 1

## 2017-02-06 MED ORDER — SALINE SPRAY 0.65 % NA SOLN
1.0000 | NASAL | Status: DC | PRN
  Administered 2017-02-06 – 2017-02-07 (×2): 1 via NASAL
  Filled 2017-02-06: qty 44

## 2017-02-06 MED ORDER — PHENOL 1.4 % MT LIQD
1.0000 | OROMUCOSAL | Status: DC | PRN
  Administered 2017-02-06 – 2017-02-07 (×3): 1 via OROMUCOSAL
  Filled 2017-02-06: qty 177

## 2017-02-06 NOTE — Progress Notes (Signed)
Progress Note  Patient Name: Ronald Bell Date of Encounter: 02/06/2017  Primary Cardiologist:    Dr. Marlou Porch  Subjective   Breathing has improved. Diuretics held for rising creatinine-  Now normal today at 0.95.  Is 2L negative - will need to resume po diuretics today.  Inpatient Medications    Scheduled Meds: . aspirin EC  81 mg Oral Daily  . atorvastatin  40 mg Oral q1800  . benzonatate  100 mg Oral BID  . bisoprolol  2.5 mg Oral Daily  . citalopram  20 mg Oral Daily  . docusate sodium  100 mg Oral BID  . enoxaparin (LOVENOX) injection  40 mg Subcutaneous Q24H  . famotidine  20 mg Oral QHS  . fenofibrate  160 mg Oral Q supper  . ferrous sulfate  325 mg Oral BID  . folic acid  1 mg Oral Daily  . guaiFENesin  600 mg Oral BID  . insulin aspart  0-15 Units Subcutaneous TID WC  . insulin aspart  0-5 Units Subcutaneous QHS  . ipratropium-albuterol  3 mL Nebulization TID  . multivitamin with minerals  1 tablet Oral Daily  . potassium chloride  40 mEq Oral BID  . sodium chloride flush  3 mL Intravenous Q12H  . thiamine  100 mg Oral Daily   Continuous Infusions: . sodium chloride     PRN Meds: sodium chloride, acetaminophen **OR** acetaminophen, guaiFENesin, ondansetron **OR** ondansetron (ZOFRAN) IV, sodium chloride flush, temazepam   Vital Signs    Vitals:   02/05/17 2226 02/06/17 0518 02/06/17 0745 02/06/17 0947  BP: (!) 108/57 123/62  (!) 110/43  Pulse: 66 73  71  Resp:      Temp: 98.1 F (36.7 C) 98 F (36.7 C)  97.7 F (36.5 C)  TempSrc: Oral Oral  Axillary  SpO2: 98% 100% 97% 98%  Weight:  144 lb 11.2 oz (65.6 kg)    Height:        Intake/Output Summary (Last 24 hours) at 02/06/2017 1030 Last data filed at 02/05/2017 2109 Gross per 24 hour  Intake 723 ml  Output 275 ml  Net 448 ml   Filed Weights   02/04/17 0429 02/05/17 0401 02/06/17 0518  Weight: 142 lb 4.8 oz (64.5 kg) 144 lb 6.4 oz (65.5 kg) 144 lb 11.2 oz (65.6 kg)    Telemetry      Sinus rhythm - Personally Reviewed  ECG    N/A - Personally Reviewed  Physical Exam   General appearance: alert, mild distress and some accessory muscle use breathing Lungs: diminished breath sounds bilaterally Heart: regular rate and rhythm, S1, S2 normal and systolic murmur: systolic ejection 3/6, crescendo at 2nd right intercostal space Extremities: extremities normal, atraumatic, no cyanosis or edema Neurologic: Grossly normal   Labs    Chemistry Recent Labs  Lab 02/01/17 0009  02/03/17 1051 02/05/17 0544 02/06/17 0530  NA 139   < > 136 136 135  K 3.8   < > 4.2 3.9 4.6  CL 101   < > 96* 99* 103  CO2 27   < > 30 30 26   GLUCOSE 144*   < > 144* 91 95  BUN 16   < > 31* 31* 23*  CREATININE 1.22   < > 1.43* 1.27* 0.95  CALCIUM 9.2   < > 9.4 9.3 9.3  PROT 7.0  --   --   --   --   ALBUMIN 3.7  --   --   --   --  AST 60*  --   --   --   --   ALT 35  --   --   --   --   ALKPHOS 27*  --   --   --   --   BILITOT 0.8  --   --   --   --   GFRNONAA 55*   < > 45* 52* >60  GFRAA >60   < > 53* >60 >60  ANIONGAP 11   < > 10 7 6    < > = values in this interval not displayed.     Hematology Recent Labs  Lab 02/02/17 0520 02/03/17 1051 02/05/17 0544  WBC 14.0* 12.8* 8.6  RBC 4.21* 4.48 4.10*  HGB 13.5 14.6 12.8*  HCT 41.9 43.9 39.9  MCV 99.5 98.0 97.3  MCH 32.1 32.6 31.2  MCHC 32.2 33.3 32.1  RDW 14.3 13.7 13.4  PLT 171 182 159    Cardiac Enzymes Recent Labs  Lab 01/31/17 1208 01/31/17 1822 02/01/17 0009  TROPONINI 0.22* 6.34* 6.04*    Recent Labs  Lab 01/31/17 0951  TROPIPOC 0.04     BNP Recent Labs  Lab 01/31/17 1055  BNP 986.0*     DDimer No results for input(s): DDIMER in the last 168 hours.   Radiology    No results found.  Cardiac Studies   ECHO  02/03/17  Left ventricle: The cavity size was normal. Wall thickness was   normal. Systolic function was moderately reduced. The estimated   ejection fraction was in the range of 35% to  40%. Akinesis of the   apical myocardium. Doppler parameters are consistent with   abnormal left ventricular relaxation (grade 1 diastolic   dysfunction). - Aortic valve: A bioprosthesis was present. Valve area (VTI): 0.7   cm^2. Valve area (Vmax): 0.72 cm^2. Valve area (Vmean): 0.67   cm^2. - Mitral valve: There was mild regurgitation. - Left atrium: The atrium was mildly dilated.  CATH  02/05/17  Patency of the left main, left circumflex, and RCA (including the RCA stented segment) with mild diffuse calcific nonobstructive stenosis Severe calcific LAD/first diagonal stenosis unchanged from the previous study in 2016 Severe bioprosthetic aortic valve stenosis with a mean gradient of 40 mmHg and calculated aortic valve area of 0.54 cm   Patient Profile     76 y.o. male w/ hx AS>>TAVR 2016, RCA stent 2016, COPD on home O2, HTN, HLD, PAD, GERD, intestinal angiodysplasia, was admitted 11/11 w/ resp failure, rx w/ nebs and Lasix.   Assessment & Plan    ACUTE ON CHRONIC DIASTOLIC HF:   Down 2.6 liters. Creatinine improved with holding lasix - LVEDP on cath yesterday was 19 mmHg. Will need more diuresis. Start lasix 40 mg po BID.  AKI:  Improved creatinine with holding lasix, but remains in CHF.  ELEVATED TROPONIN:    Right and left heart cath shows no significant progression of CAD - there is severe bioprosthetic valve stenosis with mean gradient of 40 mmHg.  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  There is severe AS with mean gradient of 40 mmHg. Without options for redo valve replacement, has a high risk of short term mortality in <1 year. Would be appropriate for a palliative care evaluation.  PAF:  NSR.  RESPIRATORY FAILURE:  Multifactorial - including COPD and CHF.  Continue therapy per  primary team.    Pixie Casino, MD, FACC, [place] Director of the [facility] Attending Cardiologist  Direct Dial: [phone]  Fax: [phone]  Website:  [email or web address]  Pixie Casino, MD  02/06/2017, 10:30 AM

## 2017-02-06 NOTE — Progress Notes (Addendum)
PROGRESS NOTE  Ronald Bell XBD:[id number] DOB: 1941-01-02 DOA: 01/31/2017 PCP: Ann Held, DO  HPI/Recap of past 24 hours:  Ronald Bell is a 76 y.o. year old male with medical history significant for severe aortic stenosis s/p TAVR (10/2014), paroxysmal atrial fibrillation ( no AC b/c recurrent GIB), CAD ( PCI ) complicated by GIB secondary to multiple AVMs,  COPD/pulmonary fibrosis who presented on 01/31/2017 with and was found to have acute on chronic hypoxic, hypercarbic respiratory failure secondary to COPD exacerbation and CHF exacerbation. Initally admitted to Step down given neede for BiPAP in addition to IV diuresis and scheduled inhaler therapy. Cardiology was consulted given troponin elevated to greater than 6 but no acute ischemic changes on EKG in addition to elevated BNP in setting of worsening pulmonary edema. Pulmonary recommended continued treatment of COPD with levaquin and scheduled nebulizer regimen upon initial admission to stepdown unit.  Patient's respiratory status has improved with scheduled inhalers, IV steroids which were then transition to oral prednisone.  Given new reduction in ejection fraction, plan is for patient to undergo left and right heart cath today.  Feels like he is working a little more to breathe. No chest pain, abdominal pain. Good BMs  Assessment/Plan: Principal Problem:   Acute on chronic respiratory failure (HCC) Active Problems:   Essential hypertension   GERD   Aortic stenosis   S/P TAVR (transcatheter aortic valve replacement)   Paroxysmal atrial fibrillation (HCC)   Acute on chronic diastolic HF (heart failure) (HCC)   Acute exacerbation of chronic obstructive pulmonary disease (COPD) (HCC)   ETOH abuse   Hyperglycemia   Abnormal EKG   Acute pulmonary edema (HCC)   Hypoxemia   Acute congestive heart failure (HCC)   Acute on chronic systolic heart failure (HCC)  Acute on chronic respiratory failure with hypoxia  and hypercapnia. Improving. Multifactorial etiology  COPD stable CHF approaching euvolemia -Continue medication regimen for CHF and COPD mentioned below  Acute on chronic steroid dependent COPD exacerbation. Improving.  On admission required BiPAP but has improved significantly on nebulizer therapy.  Improved interstitial edema on CXR from 11/11 in comparison to admission. No wheezing on exam - On 3 L [place] ( home dose 2 L PRN, nightlY), expect will require new o2 supplementation at home - Completed Levaquin and IV steroids/prednisone - Duo-nebs TID - supportive care: mucinex, tessalon pearls  Chronic diastolic CHF with new reduced ejection fraction, euvolemic.  Down 2.1 L during admission.  Weight 144 lbs (initially on admission 150 lbs).  No pitting edema but crackles at bases of lungs Careful volume management necessary in setting of worsening TAVR function -talk with cardiology, would like to re-initiate lasix -continue bisoprolol - Daily weights, fluid restriction, low sodium diet, monitor   Elevated troponin, stable  Repeat TTE shows apical akinesis and new reduction in ejection fraction.  LHC shows diffuse nonobstructive disease with no new stenosis Asymptomatic -cardiology following - continue aspirin   Paroxsymal Atrial Fibrillation. Rate controlled. On amiodarone in the past.  No anticoagulation given history of prior GIB.  Aortic Stenosis s/p TAVR (2016).LHC - progressive valve dysfunction.  -Cardiology following, no further medication management. Careful management of volume status. Palliative care consulted  Ethanol Abuse. Stable. .  -CIWA protocol, prn lorazepam, folate and thiamine  Code Status: Full Code  Family Communication: None at bedside   Disposition Plan: tenuous volume status. See response to oral lasix to determine appropriate regimen before able to discharge  Consultants:  Cardiology. Dr.  Jeneen Rinks Hochrein  Procedures:  TTE 02/03/17:  EF 35-40%, Apical  akinesis, Aortic valve mean gradient : 37 mmHg, valve area 0.7 CATH (left and right) 02/05/17: mild diffuse nonobstructive stenosis. Severe calcific LAD/first diagonal stenosis unchanged from the previous study in 2016 Severe bioprosthetic aortic valve stenosis with a mean gradient of 40 mmHg and calculated aortic valve area of 0.54 cm    Antimicrobials:   Levaquin 11/11-Current  Cultures:  None  DVT prophylaxis:  Lovenox   Objective: Vitals:   02/05/17 1609 02/05/17 2000 02/05/17 2226 02/06/17 0518  BP:   (!) 108/57 123/62  Pulse:   66 73  Resp:      Temp:   98.1 F (36.7 C) 98 F (36.7 C)  TempSrc:   Oral Oral  SpO2: 99% 97% 98% 100%  Weight:    65.6 kg (144 lb 11.2 oz)  Height:        Intake/Output Summary (Last 24 hours) at 02/06/2017 0832 Last data filed at 02/05/2017 2109 Gross per 24 hour  Intake 723 ml  Output 275 ml  Net 448 ml   Filed Weights   02/04/17 0429 02/05/17 0401 02/06/17 0518  Weight: 64.5 kg (142 lb 4.8 oz) 65.5 kg (144 lb 6.4 oz) 65.6 kg (144 lb 11.2 oz)    Exam:   General:  Sitting in bed, in no distress  Respiratory: 3LNC, crackles at bases and mid lungsno wheezing, no accessory muscle use  Cardiovascular: no peripheral edema  Skin: clean, dry, intact  Neurologic:alert and oriented x3  Psychiatry: normal affect and mood    Data Reviewed: CBC: Recent Labs  Lab 01/31/17 0941 02/01/17 0009 02/02/17 0520 02/03/17 1051 02/05/17 0544  WBC 16.8* 13.8* 14.0* 12.8* 8.6  NEUTROABS 11.2*  --  12.3*  --   --   HGB 13.8 13.0 13.5 14.6 12.8*  HCT 43.0 39.2 41.9 43.9 39.9  MCV 99.3 98.0 99.5 98.0 97.3  PLT 213 160 171 182 035   Basic Metabolic Panel: Recent Labs  Lab 02/01/17 0009 02/02/17 0520 02/03/17 1051 02/05/17 0544 02/06/17 0530  NA 139 140 136 136 135  K 3.8 4.4 4.2 3.9 4.6  CL 101 101 96* 99* 103  CO2 27 29 30 30 26   GLUCOSE 144* 110* 144* 91 95  BUN 16 27* 31* 31* 23*  CREATININE 1.22 1.38* 1.43* 1.27* 0.95   CALCIUM 9.2 9.6 9.4 9.3 9.3   GFR: Estimated Creatinine Clearance: 53.5 mL/min (by C-G formula based on SCr of 0.95 mg/dL). Liver Function Tests: Recent Labs  Lab 02/01/17 0009  AST 60*  ALT 35  ALKPHOS 27*  BILITOT 0.8  PROT 7.0  ALBUMIN 3.7   No results for input(s): LIPASE, AMYLASE in the last 168 hours. No results for input(s): AMMONIA in the last 168 hours. Coagulation Profile: Recent Labs  Lab 02/01/17 0009 02/05/17 0544  INR 1.19 1.13   Cardiac Enzymes: Recent Labs  Lab 01/31/17 1208 01/31/17 1822 02/01/17 0009  TROPONINI 0.22* 6.34* 6.04*   BNP (last 3 results) No results for input(s): PROBNP in the last 8760 hours. HbA1C: No results for input(s): HGBA1C in the last 72 hours. CBG: Recent Labs  Lab 02/04/17 2125 02/05/17 0811 02/05/17 1258 02/05/17 1705 02/05/17 2224  GLUCAP 137* 97 134* 161* 123*   Lipid Profile: No results for input(s): CHOL, HDL, LDLCALC, TRIG, CHOLHDL, LDLDIRECT in the last 72 hours. Thyroid Function Tests: No results for input(s): TSH, T4TOTAL, FREET4, T3FREE, THYROIDAB in the last 72 hours. Anemia Panel:  No results for input(s): VITAMINB12, FOLATE, FERRITIN, TIBC, IRON, RETICCTPCT in the last 72 hours. Urine analysis:    Component Value Date/Time   COLORURINE YELLOW 11/16/2014 1058   APPEARANCEUR CLEAR 11/16/2014 1058   LABSPEC 1.015 11/16/2014 1058   PHURINE 6.5 11/16/2014 1058   GLUCOSEU NEGATIVE 11/16/2014 1058   HGBUR NEGATIVE 11/16/2014 1058   HGBUR negative 11/30/2008 0000   BILIRUBINUR neg 05/09/2015 1106   KETONESUR NEGATIVE 11/16/2014 1058   PROTEINUR neg 05/09/2015 1106   PROTEINUR NEGATIVE 11/16/2014 1058   UROBILINOGEN 0.2 05/09/2015 1106   UROBILINOGEN 0.2 11/16/2014 1058   NITRITE neg 05/09/2015 1106   NITRITE NEGATIVE 11/16/2014 1058   LEUKOCYTESUR Negative 05/09/2015 1106   Sepsis Labs: @LABRCNTIP (procalcitonin:4,lacticidven:4)  ) Recent Results (from the past 240 hour(s))  MRSA PCR Screening      Status: None   Collection Time: 01/31/17  3:44 PM  Result Value Ref Range Status   MRSA by PCR NEGATIVE NEGATIVE Final    Comment:        The GeneXpert MRSA Assay (FDA approved for NASAL specimens only), is one component of a comprehensive MRSA colonization surveillance program. It is not intended to diagnose MRSA infection nor to guide or monitor treatment for MRSA infections.       Studies: No results found.  Scheduled Meds: . aspirin EC  81 mg Oral Daily  . atorvastatin  40 mg Oral q1800  . benzonatate  100 mg Oral BID  . bisoprolol  2.5 mg Oral Daily  . citalopram  20 mg Oral Daily  . docusate sodium  100 mg Oral BID  . enoxaparin (LOVENOX) injection  40 mg Subcutaneous Q24H  . famotidine  20 mg Oral QHS  . fenofibrate  160 mg Oral Q supper  . ferrous sulfate  325 mg Oral BID  . folic acid  1 mg Oral Daily  . guaiFENesin  600 mg Oral BID  . insulin aspart  0-15 Units Subcutaneous TID WC  . insulin aspart  0-5 Units Subcutaneous QHS  . ipratropium-albuterol  3 mL Nebulization TID  . multivitamin with minerals  1 tablet Oral Daily  . potassium chloride  40 mEq Oral BID  . sodium chloride flush  3 mL Intravenous Q12H  . thiamine  100 mg Oral Daily    Continuous Infusions: . sodium chloride       LOS: 6 days     Desiree Hane, MD [facility] Pager [phone]  If 7PM-7AM, please contact night-coverage [email or web address] Password [email or web address] 02/06/2017, 8:32 AM

## 2017-02-06 NOTE — Plan of Care (Signed)
Patient resting comfortably for the night. Order for chloraseptic spray for sore throat and nasal spray for irritated nares placed and dose given to the patient.

## 2017-02-06 NOTE — Progress Notes (Signed)
Pt as not used NIV for several days, will D/C at this time.

## 2017-02-07 DIAGNOSIS — Z952 Presence of prosthetic heart valve: Secondary | ICD-10-CM

## 2017-02-07 DIAGNOSIS — I480 Paroxysmal atrial fibrillation: Secondary | ICD-10-CM

## 2017-02-07 DIAGNOSIS — Z515 Encounter for palliative care: Secondary | ICD-10-CM

## 2017-02-07 DIAGNOSIS — I10 Essential (primary) hypertension: Secondary | ICD-10-CM

## 2017-02-07 LAB — GLUCOSE, CAPILLARY
GLUCOSE-CAPILLARY: 105 mg/dL — AB (ref 65–99)
GLUCOSE-CAPILLARY: 132 mg/dL — AB (ref 65–99)
GLUCOSE-CAPILLARY: 107 mg/dL — AB (ref 65–99)
Glucose-Capillary: 145 mg/dL — ABNORMAL HIGH (ref 65–99)

## 2017-02-07 MED ORDER — ALPRAZOLAM 0.5 MG PO TABS: 0.5000 mg | ORAL_TABLET | Freq: Three times a day (TID) | ORAL | Status: DC | PRN

## 2017-02-07 MED ORDER — FUROSEMIDE 40 MG PO TABS
40.0000 mg | ORAL_TABLET | Freq: Two times a day (BID) | ORAL | Status: DC
  Filled 2017-02-07: qty 1

## 2017-02-07 MED ORDER — FUROSEMIDE 40 MG PO TABS: 40.0000 mg | ORAL_TABLET | Freq: Every day | ORAL | Status: DC

## 2017-02-07 MED ORDER — FUROSEMIDE 40 MG PO TABS
40.0000 mg | ORAL_TABLET | Freq: Every day | ORAL | Status: DC
  Administered 2017-02-08: 40 mg via ORAL
  Filled 2017-02-07: qty 1

## 2017-02-07 NOTE — Consult Note (Signed)
Consultation Note Date: 02/07/2017   Patient Name: Ronald Bell  DOB: [birth year]  MRN: [id number]  Age / Sex: 76 y.o., male  PCP: Carollee Herter, Alferd Apa, DO Referring Physician: Desiree Hane, MD  Reason for Consultation: Establishing goals of care  HPI/Patient Profile: 76 y.o. male  with past medical history of aortic stenosis (s/p TAVR), COPD, GIB w/ AVM's, CHF, pulmonary fibrosis,  admitted on 01/31/2017 with acute on chronic respiratory failure requiring Bipap. Workup revealed worsening CHF d/t severe aortic stenosis. Diuresing, respiratory status now improved. No intervention for aortic stenosis and worsening TAVR function possible. Palliative medicine consulted for [place].    Clinical Assessment and Goals of Care: Met with patient at bedside.   I have reviewed medical records including EPIC notes, labs and imaging,  assessed the patient and then met at the bedside to discuss diagnosis prognosis, GOC, EOL wishes, disposition and options.  I introduced Palliative Medicine as specialized medical care for people living with serious illness. It focuses on providing relief from the symptoms and stress of a serious illness. The goal is to improve quality of life for both the patient and the family.  We discussed a brief life review of the patient. 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 He is married to his spouse, Clarice and they live together in their home. He has four children from a previous marriage- his children and spouse "mostly get along".   During the consult we were joined by Cardiology NP who reviewed patient's diagnosis and options. Patient was visibly shaken. He stated he "knew it was coming, but wasn't ready for it".  I gave him emotional support. Reviewed his concerns and hopes. Noted patient takes alprazolam .5 tid prn at home for anxiety-  he requested this be reordered.  I attempted to elicit [place] important to patient and offered to arrange full [place], Advance directives meeting with patient's spouse present. We discussed the importance of including her in the conversation as she would be primary decision maker if he were unable to make decisions. Patient stated he would rather wait to have this conversation until he got home. He is currently enrolled with [facility].    Primary Decision Maker PATIENT    SUMMARY OF RECOMMENDATIONS    -Care manager referral- notify [facility] of patient status- need visit at home ASAP for continued GOC and likely transition to Hospice when discharged -Alprazolam .71m TID prn po for anxiety -PMT will f/u with patient tomorrow and attempt to further discuss GSibleyafter he has further processed new information of his prognosis  Code Status/Advance Care Planning:  Full code  Prognosis:    Unable to determine  Discharge Planning: Home with Palliative Services  Primary Diagnoses: Present on Admission: . Acute exacerbation of chronic obstructive pulmonary disease (COPD) ([facility] . Acute on chronic respiratory failure ([facility] . Acute on chronic diastolic HF (heart failure) ([facility] . Essential hypertension . GERD . Paroxysmal atrial fibrillation (HCC) . ETOH  abuse . Hyperglycemia . Abnormal EKG   I have reviewed the medical record, interviewed the patient and family, and examined the patient. The following aspects are pertinent.  Past Medical History:  Diagnosis Date  . Angiodysplasia of intestine with hemorrhage    large and SB, gastric AVMs.   . Anxiety   . Arthritis    "left shoulder" (10/19/2014)  . Carotid artery occlusion   . Carotid artery stenosis 04/22/2012  . COPD GOLD III with min reversibilty  08/11/2006   Followed in Pulmonary clinic/ [facility]/ [facility] - PFT's 04/28/2013  FEV1 0.88 (40%) with ratio 44 and 14% better p B2 dlco 45 corrects to 83  - Trial of breo 04/28/2013 > improved symptoms  06/09/2013  - spirometry 06/04/2014 FEV1  0.76 (29%) ratio 45      . Coronary artery disease   . GERD 11/30/2008   Qualifier: Diagnosis of  By: Marijean Niemann CMA, Danielle    . GERD (gastroesophageal reflux disease)   . GI bleed 2010   4 units PRBCs  . History of blood transfusion "couple times"   "related to bleeding in colon and esophagus"  . Hx of adenomatous colonic polyps 2012, 2013.   Marland Kitchen Hyperlipidemia   . Hypertension   . Iron deficiency anemia 01/25/2009   Qualifier: Diagnosis of  By: Henrene Pastor MD, Docia Chuck   . Irregular heartbeat   . On home oxygen therapy    "2L at night" (07/23/2015)  . Peripheral vascular disease ([place])   . Pleural plaque with presence of asbestos 03/27/2013   Followed in Pulmonary clinic/ [facility]/ [facility] - F/u CT 09/08/2013 1. Stable extensive calcified pleural plaque formation consistent with asbestos exposure. 2. Multiple pulmonary nodules are unchanged from the CT of 6 months ago. Given risk factors for lung cancer, continued follow up is recommended with chest CT in 6 months> done 04/20/14 no change >repeat in 12 m in tickle file     . Polyp of nasal cavity   . PVD (peripheral vascular disease) ([place]) 10/18/2012  . Rosacea   . 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 Shingles   . Tobacco abuse    Social History   Socioeconomic History  . Marital status: Married    Spouse name: None  . Number of children: 4  . Years of education: None  . Highest education level: None  Social Needs  . Financial resource strain: None  . Food insecurity - worry: None  . Food insecurity - inability: None  . Transportation needs - medical: None  . Transportation needs - non-medical: None  Occupational History  . Occupation: Oncologist: [employer]  Tobacco Use  . Smoking status: Former Smoker     Packs/day: 1.00    Years: 60.00    Pack years: 60.00    Types: Cigarettes, E-cigarettes    Last attempt to quit: 04/18/2015    Years since quitting: 1.8  . Smokeless tobacco: Never Used  Substance and Sexual Activity  . Alcohol use: Yes    Alcohol/week: 7.2 oz    Types: 12 Cans of beer per week  . Drug use: No  . Sexual activity: No    Partners: Female  Other Topics Concern  . None  Social History Narrative  . None   Family History  Problem Relation Age of Onset  . Lung disease Mother  pulm fibrosis  . Colitis Father   . Heart disease Brother   . Hypertension Brother   . Hyperlipidemia Brother   . CAD Daughter        cad  . Hypertension Son   . Colon cancer Neg Hx    Scheduled Meds: . aspirin EC  81 mg Oral Daily  . atorvastatin  40 mg Oral q1800  . benzonatate  100 mg Oral BID  . bisoprolol  2.5 mg Oral Daily  . citalopram  20 mg Oral Daily  . docusate sodium  100 mg Oral BID  . enoxaparin (LOVENOX) injection  40 mg Subcutaneous Q24H  . famotidine  20 mg Oral QHS  . fenofibrate  160 mg Oral Q supper  . ferrous sulfate  325 mg Oral BID  . folic acid  1 mg Oral Daily  . [START ON 02/08/2017] furosemide  40 mg Oral Daily  . guaiFENesin  600 mg Oral BID  . insulin aspart  0-15 Units Subcutaneous TID WC  . insulin aspart  0-5 Units Subcutaneous QHS  . ipratropium-albuterol  3 mL Nebulization TID  . multivitamin with minerals  1 tablet Oral Daily  . potassium chloride  40 mEq Oral BID  . sodium chloride flush  3 mL Intravenous Q12H  . thiamine  100 mg Oral Daily   Continuous Infusions: . sodium chloride     PRN Meds:.sodium chloride, acetaminophen **OR** acetaminophen, ALPRAZolam, guaiFENesin, ondansetron **OR** ondansetron (ZOFRAN) IV, phenol, sodium chloride, sodium chloride flush, temazepam Medications Prior to Admission:  Prior to Admission medications   Medication Sig Start Date End Date Taking? Authorizing Provider  ALPRAZolam Duanne Moron) 0.5 MG tablet  TAKE 1 TABLET BY MOUTH THREE TIMES DAILY AS NEEDED FOR ANXIETY 01/12/17  Yes Ann Held, DO  aspirin 81 MG tablet Take 81 mg by mouth every morning.    Yes [provider]  atorvastatin (LIPITOR) 40 MG tablet Take 1 tablet (40 mg total) by mouth daily. 11/24/16  Yes Roma Schanz R, DO  bisoprolol (ZEBETA) 5 MG tablet Take 0.5 tablets (2.5 mg total) by mouth daily. 06/16/16  Yes Jerline Pain, MD  BROVANA 15 MCG/2ML NEBU USE 1 VIAL IN NEBULIZER TWICE DAILY 11/16/16  Yes Tanda Rockers, MD  budesonide (PULMICORT) 0.5 MG/2ML nebulizer solution USE ONE VIAL (2ML) IN NEBULIZER TWICE DAILY 09/01/16  Yes Lowne Lyndal Pulley R, DO  citalopram (CELEXA) 20 MG tablet Take 1 tablet (20 mg total) by mouth daily. 11/24/16  Yes Roma Schanz R, DO  docusate sodium (COLACE) 100 MG capsule Take 100-200 mg by mouth See admin instructions. 1 capsule by mouth in the morning and 2 at bedtime    Yes [provider]  famotidine (PEPCID) 20 MG tablet Take 20 mg by mouth at bedtime.    Yes [provider]  fenofibrate 160 MG tablet Take 1 tablet (160 mg total) by mouth daily. 11/24/16  Yes Roma Schanz R, DO  Ferrous Sulfate (IRON) 325 (65 Fe) MG TABS TAKE ONE TABLET BY MOUTH TWICE DAILY 01/21/17  Yes Irene Shipper, MD  furosemide (LASIX) 20 MG tablet Take 20 mg by mouth daily.   Yes [provider]  guaiFENesin (MUCINEX) 600 MG 12 hr tablet Take 1 tablet (600 mg total) by mouth 2 (two) times daily. 09/08/16 09/08/17 Yes Dhungel, Nishant, MD  Multiple Vitamins-Minerals (CENTRUM SILVER PO) Take 1 tablet by mouth daily.    Yes [provider]  OXYGEN Use 2L  at bedtime and as needed for shortness of breath   Yes [provider]  pantoprazole (PROTONIX) 40 MG tablet Take 1 tablet (40 mg total) by mouth daily. 11/24/16  Yes Ann Held, DO  predniSONE (DELTASONE) 20 MG tablet Take 1 tablet (20 mg total) by mouth daily with breakfast. 09/08/16  Yes  Dhungel, Nishant, MD  sodium chloride (OCEAN) 0.65 % SOLN nasal spray Place 1-2 sprays into both nostrils as needed for congestion.    Yes [provider]  temazepam (RESTORIL) 30 MG capsule TAKE 1 CAPSULE BY MOUTH AT BEDTIME AS NEEDED FOR SLEEP 12/07/16  Yes Roma Schanz R, DO  umeclidinium bromide (INCRUSE ELLIPTA) 62.5 MCG/INH AEPB Inhale 1 puff into the lungs daily. 11/10/16  Yes Tanda Rockers, MD  vitamin C (ASCORBIC ACID) 500 MG tablet Take 500 mg by mouth daily.   Yes [provider]  albuterol (PROVENTIL HFA;VENTOLIN HFA) 108 (90 Base) MCG/ACT inhaler Inhale 2 puffs into the lungs every 6 (six) hours as needed for wheezing or shortness of breath (Plan B). 12/08/15   Roma Schanz R, DO  atorvastatin (LIPITOR) 40 MG tablet TAKE 1 TABLET BY MOUTH ONCE DAILY 12/03/16   Carollee Herter, Kendrick Fries R, DO  ferrous sulfate 325 (65 FE) MG EC tablet Take 1 tablet (325 mg total) by mouth 2 (two) times daily. 11/24/16   Roma Schanz R, DO  ipratropium-albuterol (DUONEB) 0.5-2.5 (3) MG/3ML SOLN Take 3 mLs by nebulization every 4 (four) hours as needed (shortness of breath and wheezing plan C).    [provider]  pantoprazole (PROTONIX) 40 MG tablet TAKE ONE TABLET BY MOUTH DAILY 02/01/17   Roma Schanz R, DO   No Known Allergies Review of Systems  Constitutional: Positive for activity change and fatigue.  Respiratory: Positive for shortness of breath.   Psychiatric/Behavioral: The patient is nervous/anxious.     Physical Exam  Constitutional: He appears well-developed.  Pulmonary/Chest: Effort normal.  Increased diameter noted  Skin: Ecchymosis noted.  Psychiatric: His mood appears anxious.  Nursing note and vitals reviewed.   Vital Signs: BP 110/62   Pulse 81   Temp 98 F (36.7 C) (Oral)   Resp (!) 22   Ht '5\' 2"'  (1.575 m)   Wt 65 kg (143 lb 4.8 oz)   SpO2 98%   BMI 26.21 kg/m  Pain Assessment: No/denies pain   Pain Score: 0-No  pain   SpO2: SpO2: 98 % O2 Device:SpO2: 98 % O2 Flow Rate: .O2 Flow Rate (L/min): 3 L/min(returned to 2L as at home)  IO: Intake/output summary:   Intake/Output Summary (Last 24 hours) at 02/07/2017 1106 Last data filed at 02/07/2017 0000 Gross per 24 hour  Intake 843 ml  Output -  Net 843 ml    LBM: Last BM Date: 02/06/17 Baseline Weight: Weight: 68 kg (150 lb) Most recent weight: Weight: 65 kg (143 lb 4.8 oz)     Palliative Assessment/Data: PPS: 50%     Thank you for this consult. Palliative medicine will continue to follow and assist as needed.   Time In: 1000 Time Out: 1115 Time Total: 75 minutes Greater than 50%  of this time was spent counseling and coordinating care related to the above assessment and plan.  Signed by: Mariana Kaufman, AGNP-C Palliative Medicine    Please contact Palliative Medicine Team phone at [phone] for questions and concerns.  For individual provider: See Shea Evans

## 2017-02-07 NOTE — Progress Notes (Signed)
Progress Note  Patient Name: Ronald Bell Date of Encounter: 02/07/2017  Primary Cardiologist: Candee Furbish, MD  Subjective  Continues to have difficulty breathing, but baseline for him. Denies further edema.    Inpatient Medications    Scheduled Meds: . aspirin EC  81 mg Oral Daily  . atorvastatin  40 mg Oral q1800  . benzonatate  100 mg Oral BID  . bisoprolol  2.5 mg Oral Daily  . citalopram  20 mg Oral Daily  . docusate sodium  100 mg Oral BID  . enoxaparin (LOVENOX) injection  40 mg Subcutaneous Q24H  . famotidine  20 mg Oral QHS  . fenofibrate  160 mg Oral Q supper  . ferrous sulfate  325 mg Oral BID  . folic acid  1 mg Oral Daily  . furosemide  40 mg Oral BID  . guaiFENesin  600 mg Oral BID  . insulin aspart  0-15 Units Subcutaneous TID WC  . insulin aspart  0-5 Units Subcutaneous QHS  . ipratropium-albuterol  3 mL Nebulization TID  . multivitamin with minerals  1 tablet Oral Daily  . potassium chloride  40 mEq Oral BID  . sodium chloride flush  3 mL Intravenous Q12H  . thiamine  100 mg Oral Daily   Continuous Infusions: . sodium chloride     PRN Meds: sodium chloride, acetaminophen **OR** acetaminophen, guaiFENesin, ondansetron **OR** ondansetron (ZOFRAN) IV, phenol, sodium chloride, sodium chloride flush, temazepam   Vital Signs    Vitals:   02/07/17 0031 02/07/17 0520 02/07/17 0822 02/07/17 0926  BP: (!) 101/40 (!) 109/54    Pulse:  81    Resp:  (!) 22    Temp:  98.8 F (37.1 C) 98 F (36.7 C)   TempSrc:  Oral Oral   SpO2:  95% 97% 98%  Weight:  65 kg (143 lb 4.8 oz)    Height:        Intake/Output Summary (Last 24 hours) at 02/07/2017 1015 Last data filed at 02/07/2017 0000 Gross per 24 hour  Intake 843 ml  Output -  Net 843 ml   Filed Weights   02/05/17 0401 02/06/17 0518 02/07/17 0520  Weight: 65.5 kg (144 lb 6.4 oz) 65.6 kg (144 lb 11.2 oz) 65 kg (143 lb 4.8 oz)    Telemetry    NSR rates in the 80's to 90's   - Personally  Reviewed  Physical Exam   GEN: No acute distress.   Neck: No JVD Cardiac: QQP,6/1 holosystolic murmur. No rubs, or gallops.  Respiratory: Coarse breath sounds, no wheezes, O2 dependent  GI: Soft, nontender, non-distended  MS: No edema; No deformity. Neuro:  Nonfocal  Psych: Normal affect   Labs    Chemistry Recent Labs  Lab 02/01/17 0009  02/03/17 1051 02/05/17 0544 02/06/17 0530  NA 139   < > 136 136 135  K 3.8   < > 4.2 3.9 4.6  CL 101   < > 96* 99* 103  CO2 27   < > 30 30 26   GLUCOSE 144*   < > 144* 91 95  BUN 16   < > 31* 31* 23*  CREATININE 1.22   < > 1.43* 1.27* 0.95  CALCIUM 9.2   < > 9.4 9.3 9.3  PROT 7.0  --   --   --   --   ALBUMIN 3.7  --   --   --   --   AST 60*  --   --   --   --  ALT 35  --   --   --   --   ALKPHOS 27*  --   --   --   --   BILITOT 0.8  --   --   --   --   GFRNONAA 55*   < > 45* 52* >60  GFRAA >60   < > 53* >60 >60  ANIONGAP 11   < > 10 7 6    < > = values in this interval not displayed.     Hematology Recent Labs  Lab 02/02/17 0520 02/03/17 1051 02/05/17 0544  WBC 14.0* 12.8* 8.6  RBC 4.21* 4.48 4.10*  HGB 13.5 14.6 12.8*  HCT 41.9 43.9 39.9  MCV 99.5 98.0 97.3  MCH 32.1 32.6 31.2  MCHC 32.2 33.3 32.1  RDW 14.3 13.7 13.4  PLT 171 182 159    Cardiac Enzymes Recent Labs  Lab 01/31/17 1208 01/31/17 1822 02/01/17 0009  TROPONINI 0.22* 6.34* 6.04*   No results for input(s): TROPIPOC in the last 168 hours.   BNP Recent Labs  Lab 01/31/17 1055  BNP 986.0*    Radiology    No results found.  Cardiac Studies  Procedures   RIGHT/LEFT HEART CATH AND CORONARY ANGIOGRAPHY  Conclusion   1.  Patency of the left main, left circumflex, and RCA (including the RCA stented segment) with mild diffuse calcific nonobstructive stenosis 2.  Severe calcific LAD/first diagonal stenosis unchanged from the previous study in 2016 3.  Severe bioprosthetic aortic valve stenosis with a mean gradient of 40 mmHg and calculated aortic  valve area of 0.54 cm   Echocardiogram 02/03/2017 Left ventricle: The cavity size was normal. Wall thickness was   normal. Systolic function was moderately reduced. The estimated   ejection fraction was in the range of 35% to 40%. Akinesis of the   apical myocardium. Doppler parameters are consistent with   abnormal left ventricular relaxation (grade 1 diastolic   dysfunction). - Aortic valve: A bioprosthesis was present. Valve area (VTI): 0.7   cm^2. Valve area (Vmax): 0.72 cm^2. Valve area (Vmean): 0.67   cm^2. - Mitral valve: There was mild regurgitation. - Left atrium: The atrium was mildly dilated.   Patient Profile     76 y.o. male with severe AS, S/P TAVR in 2016 with severe bioprosthetic AoV stenosis, significant systolic CHF, with EF of 277-41%, per echo, CAD, with stent to 2016, O2 Dependent COPD, HTN, HLD, PAD, GERD, intestinal angiodysplasia, admitted with acute on chronic CHF and respiratory failure. Diuresed with IV lasix, now transitioned to po.   Assessment & Plan    1. Acute on Chronic Mixed CHF: He has diuresed 1.327 since admission. Creatinine 0.96 improved from 1.27 yesterday. He has been placed on po lasix 40 mg BID, Weight on admission 150 lbs (68 kg), now weight 143 lbs, (65 kg).   2. Severe Bio AoV stenosis; Inoperable. He had a thrombosis, which could not be treated with anticoagulation. No planned interventions or redo. He is recommended for Palliative Care. They are seeing him today. He will continue on HR control with bisoprolol.   3. Troponin Elevation: Had RHC and LHC, without significant CAD.   4. O2 Dependent COPD: Breathing is at baseline. Easily choked and dyspneic with talking. No active wheezing. Ongong management by pulmonary.   Signed, Phill Myron. Durward Matranga DNP, ANP, AACC   02/07/2017, 10:15 AM

## 2017-02-07 NOTE — Progress Notes (Signed)
PROGRESS NOTE  Ronald Bell KDT:[id number] DOB: 1940/10/17 DOA: 01/31/2017 PCP: Ann Held, DO  HPI/Recap of past 24 hours:  Ronald Bell is a 76 y.o. year old male with medical history significant for severe aortic stenosis s/p TAVR (10/2014), paroxysmal atrial fibrillation ( no AC b/c recurrent GIB), CAD ( PCI ) complicated by GIB secondary to multiple AVMs,  COPD/pulmonary fibrosis who presented on 01/31/2017 with and was found to have acute on chronic hypoxic, hypercarbic respiratory failure secondary to COPD exacerbation and CHF exacerbation. Initally admitted to Step down given neede for BiPAP in addition to IV diuresis and scheduled inhaler therapy. Cardiology was consulted given troponin elevated to greater than 6 but no acute ischemic changes on EKG in addition to elevated BNP in setting of worsening pulmonary edema. Pulmonary recommended continued treatment of COPD with levaquin and scheduled nebulizer regimen upon initial admission to stepdown unit.  Patient's respiratory status has improved with scheduled inhalers, IV steroids which were then transition to oral prednisone.  Given new reduction in ejection fraction LHC obtained showed worsening TAVR function.    This morning complains of sore throat and runny nose. Improved with neb treatment per patient. Feels breathing is slightly improved.   Assessment/Plan: Principal Problem:   Acute on chronic respiratory failure (HCC) Active Problems:   Essential hypertension   GERD   Aortic stenosis   S/P TAVR (transcatheter aortic valve replacement)   Paroxysmal atrial fibrillation (HCC)   Acute on chronic diastolic HF (heart failure) (HCC)   Acute exacerbation of chronic obstructive pulmonary disease (COPD) (HCC)   ETOH abuse   Hyperglycemia   Abnormal EKG   Acute pulmonary edema (HCC)   Hypoxemia   Acute congestive heart failure (HCC)   Acute on chronic systolic heart failure (HCC)  Acute on chronic  respiratory failure with hypoxia and hypercapnia. Stable. Multifactorial etiology  COPD stable CHF approaching euvolemia -Continue medication regimen for CHF and COPD mentioned below  Acute on chronic steroid dependent COPD exacerbation. Stable.  On admission required BiPAP but has improved significantly on nebulizer therapy.  Back to home oxygen requirements of 2 L.  Has completed levaquin and prednisone therapy for exacerbation.  Currently on schedule duo-nebs will discuss with RT re-initating home inhaler regimen.  Having what sounds like URI symptoms so will continue supportive care as well with mucinex and tessalon pearls.   Chronic diastolic CHF with new reduced ejection fraction, euvolemic.  Intake/output has not been accurate however weight has gone down during admission, CXR shows improvement in edema, and volume status on exam is significantly improved. Hesitant to be aggressive with oral lasix given worsening TAVR for Aortic stenosis. Additionally positive orthostatics.(lLying 119/54, Sitting 111/64, 86/65 Standing) Decrease lasix regimen to 40 mg qd Continue bisoprolol Daily weights, fluid restriction, low sodium diet, monitor   Elevated troponin, stable, resolved Not related to ischemic event given LHC with no new stenotic lesions. Patient remains asymptomatic Continue aspirin   Paroxsymal Atrial Fibrillation. Rate controlled. On amiodarone in the past.  No anticoagulation given history of prior GIB.  Aortic Stenosis s/p TAVR (2016), worsening valve function LHC confirmed worsening valve gradient and pressures.  We will need to be careful with volume status in setting of worsening AS and no further medication management to improve Decrease lasix regimen to 40 mg qd Cardiology following Palliative care consulted   Ethanol Abuse. Stable. .  -CIWA protocol, prn lorazepam, folate and thiamine  Code Status: Full Code  Family Communication: None at  bedside   Disposition Plan:  Decrease oral lasix and assure no continued orthostatics but also adequate fluid removal in very tenuous patient Consultants:  Cardiology. Dr. Minus Breeding  Procedures:  TTE 02/03/17:  EF 35-40%, Apical akinesis, Aortic valve mean gradient : 37 mmHg, valve area 0.7 CATH (left and right) 02/05/17: mild diffuse nonobstructive stenosis. Severe calcific LAD/first diagonal stenosis unchanged from the previous study in 2016 Severe bioprosthetic aortic valve stenosis with a mean gradient of 40 mmHg and calculated aortic valve area of 0.54 cm    Antimicrobials:   Levaquin 11/11-11/15  Cultures:  None  DVT prophylaxis:  Lovenox   Objective: Vitals:   02/07/17 0031 02/07/17 0520 02/07/17 0822 02/07/17 0926  BP: (!) 101/40 (!) 109/54    Pulse:  81    Resp:  (!) 22    Temp:  98.8 F (37.1 C) 98 F (36.7 C)   TempSrc:  Oral Oral   SpO2:  95% 97% 98%  Weight:  65 kg (143 lb 4.8 oz)    Height:        Intake/Output Summary (Last 24 hours) at 02/07/2017 1026 Last data filed at 02/07/2017 0000 Gross per 24 hour  Intake 843 ml  Output -  Net 843 ml   Filed Weights   02/05/17 0401 02/06/17 0518 02/07/17 0520  Weight: 65.5 kg (144 lb 6.4 oz) 65.6 kg (144 lb 11.2 oz) 65 kg (143 lb 4.8 oz)    Exam:   General:  Sitting in bed, in no distress  Respiratory: 2LNC, minimal crackles at bases, no wheezing or rhonchi, no accessory muscle use, no appreciable JVD  Cardiovascular: no peripheral edema  Skin: clean, dry, intact  Neurologic:alert and oriented x3  Psychiatry: normal affect and mood    Data Reviewed: CBC: Recent Labs  Lab 02/01/17 0009 02/02/17 0520 02/03/17 1051 02/05/17 0544  WBC 13.8* 14.0* 12.8* 8.6  NEUTROABS  --  12.3*  --   --   HGB 13.0 13.5 14.6 12.8*  HCT 39.2 41.9 43.9 39.9  MCV 98.0 99.5 98.0 97.3  PLT 160 171 182 935   Basic Metabolic Panel: Recent Labs  Lab 02/01/17 0009 02/02/17 0520 02/03/17 1051 02/05/17 0544 02/06/17 0530  NA  139 140 136 136 135  K 3.8 4.4 4.2 3.9 4.6  CL 101 101 96* 99* 103  CO2 27 29 30 30 26   GLUCOSE 144* 110* 144* 91 95  BUN 16 27* 31* 31* 23*  CREATININE 1.22 1.38* 1.43* 1.27* 0.95  CALCIUM 9.2 9.6 9.4 9.3 9.3   GFR: Estimated Creatinine Clearance: 49.5 mL/min (by C-G formula based on SCr of 0.95 mg/dL). Liver Function Tests: Recent Labs  Lab 02/01/17 0009  AST 60*  ALT 35  ALKPHOS 27*  BILITOT 0.8  PROT 7.0  ALBUMIN 3.7   No results for input(s): LIPASE, AMYLASE in the last 168 hours. No results for input(s): AMMONIA in the last 168 hours. Coagulation Profile: Recent Labs  Lab 02/01/17 0009 02/05/17 0544  INR 1.19 1.13   Cardiac Enzymes: Recent Labs  Lab 01/31/17 1208 01/31/17 1822 02/01/17 0009  TROPONINI 0.22* 6.34* 6.04*   BNP (last 3 results) No results for input(s): PROBNP in the last 8760 hours. HbA1C: No results for input(s): HGBA1C in the last 72 hours. CBG: Recent Labs  Lab 02/06/17 0839 02/06/17 1211 02/06/17 1552 02/06/17 2155 02/07/17 0750  GLUCAP 130* 104* 153* 137* 105*   Lipid Profile: No results for input(s): CHOL, HDL, LDLCALC, TRIG, CHOLHDL, LDLDIRECT in  the last 72 hours. Thyroid Function Tests: No results for input(s): TSH, T4TOTAL, FREET4, T3FREE, THYROIDAB in the last 72 hours. Anemia Panel: No results for input(s): VITAMINB12, FOLATE, FERRITIN, TIBC, IRON, RETICCTPCT in the last 72 hours. Urine analysis:    Component Value Date/Time   COLORURINE YELLOW 11/16/2014 1058   APPEARANCEUR CLEAR 11/16/2014 1058   LABSPEC 1.015 11/16/2014 1058   PHURINE 6.5 11/16/2014 1058   GLUCOSEU NEGATIVE 11/16/2014 1058   HGBUR NEGATIVE 11/16/2014 1058   HGBUR negative 11/30/2008 0000   BILIRUBINUR neg 05/09/2015 1106   KETONESUR NEGATIVE 11/16/2014 1058   PROTEINUR neg 05/09/2015 1106   PROTEINUR NEGATIVE 11/16/2014 1058   UROBILINOGEN 0.2 05/09/2015 1106   UROBILINOGEN 0.2 11/16/2014 1058   NITRITE neg 05/09/2015 1106   NITRITE  NEGATIVE 11/16/2014 1058   LEUKOCYTESUR Negative 05/09/2015 1106   Sepsis Labs: @LABRCNTIP (procalcitonin:4,lacticidven:4)  ) Recent Results (from the past 240 hour(s))  MRSA PCR Screening     Status: None   Collection Time: 01/31/17  3:44 PM  Result Value Ref Range Status   MRSA by PCR NEGATIVE NEGATIVE Final    Comment:        The GeneXpert MRSA Assay (FDA approved for NASAL specimens only), is one component of a comprehensive MRSA colonization surveillance program. It is not intended to diagnose MRSA infection nor to guide or monitor treatment for MRSA infections.       Studies: No results found.  Scheduled Meds: . aspirin EC  81 mg Oral Daily  . atorvastatin  40 mg Oral q1800  . benzonatate  100 mg Oral BID  . bisoprolol  2.5 mg Oral Daily  . citalopram  20 mg Oral Daily  . docusate sodium  100 mg Oral BID  . enoxaparin (LOVENOX) injection  40 mg Subcutaneous Q24H  . famotidine  20 mg Oral QHS  . fenofibrate  160 mg Oral Q supper  . ferrous sulfate  325 mg Oral BID  . folic acid  1 mg Oral Daily  . furosemide  40 mg Oral BID  . guaiFENesin  600 mg Oral BID  . insulin aspart  0-15 Units Subcutaneous TID WC  . insulin aspart  0-5 Units Subcutaneous QHS  . ipratropium-albuterol  3 mL Nebulization TID  . multivitamin with minerals  1 tablet Oral Daily  . potassium chloride  40 mEq Oral BID  . sodium chloride flush  3 mL Intravenous Q12H  . thiamine  100 mg Oral Daily    Continuous Infusions: . sodium chloride       LOS: 7 days     Desiree Hane, MD [facility] Pager [phone]  If 7PM-7AM, please contact night-coverage [email or web address] Password [email or web address] 02/07/2017, 10:26 AM

## 2017-02-07 NOTE — Progress Notes (Signed)
Progress Note  Patient Name: Ronald Bell Date of Encounter: 02/07/2017  Primary Cardiologist:    Dr. Marlou Porch  Subjective   Breathing has improved, not at baseline yet. Diuretics held yesterday for worsening creatinine-  Now normal today at 0.95.  Is 2L negative - will need to resume po diuretics today.  Inpatient Medications    Scheduled Meds: . aspirin EC  81 mg Oral Daily  . atorvastatin  40 mg Oral q1800  . benzonatate  100 mg Oral BID  . bisoprolol  2.5 mg Oral Daily  . citalopram  20 mg Oral Daily  . docusate sodium  100 mg Oral BID  . enoxaparin (LOVENOX) injection  40 mg Subcutaneous Q24H  . famotidine  20 mg Oral QHS  . fenofibrate  160 mg Oral Q supper  . ferrous sulfate  325 mg Oral BID  . folic acid  1 mg Oral Daily  . [START ON 02/08/2017] furosemide  40 mg Oral Daily  . guaiFENesin  600 mg Oral BID  . insulin aspart  0-15 Units Subcutaneous TID WC  . insulin aspart  0-5 Units Subcutaneous QHS  . ipratropium-albuterol  3 mL Nebulization TID  . multivitamin with minerals  1 tablet Oral Daily  . potassium chloride  40 mEq Oral BID  . sodium chloride flush  3 mL Intravenous Q12H  . thiamine  100 mg Oral Daily   Continuous Infusions: . sodium chloride     PRN Meds: sodium chloride, acetaminophen **OR** acetaminophen, ALPRAZolam, guaiFENesin, ondansetron **OR** ondansetron (ZOFRAN) IV, phenol, sodium chloride, sodium chloride flush, temazepam   Vital Signs    Vitals:   02/07/17 0031 02/07/17 0520 02/07/17 0822 02/07/17 0926  BP: (!) 101/40 (!) 109/54    Pulse:  81    Resp:  (!) 22    Temp:  98.8 F (37.1 C) 98 F (36.7 C)   TempSrc:  Oral Oral   SpO2:  95% 97% 98%  Weight:  143 lb 4.8 oz (65 kg)    Height:        Intake/Output Summary (Last 24 hours) at 02/07/2017 1057 Last data filed at 02/07/2017 0000 Gross per 24 hour  Intake 843 ml  Output -  Net 843 ml   Filed Weights   02/05/17 0401 02/06/17 0518 02/07/17 0520  Weight: 144 lb  6.4 oz (65.5 kg) 144 lb 11.2 oz (65.6 kg) 143 lb 4.8 oz (65 kg)    Telemetry    Sinus rhythm - Personally Reviewed  ECG    N/A - Personally Reviewed  Physical Exam   General appearance: alert, mild distress and some accessory muscle use breathing Lungs: diminished breath sounds bilaterally Heart: regular rate and rhythm, S1, S2 normal and systolic murmur: systolic ejection 3/6, crescendo at 2nd right intercostal space Extremities: extremities normal, atraumatic, no cyanosis or edema Neurologic: Grossly normal   Labs    Chemistry Recent Labs  Lab 02/01/17 0009  02/03/17 1051 02/05/17 0544 02/06/17 0530  NA 139   < > 136 136 135  K 3.8   < > 4.2 3.9 4.6  CL 101   < > 96* 99* 103  CO2 27   < > 30 30 26   GLUCOSE 144*   < > 144* 91 95  BUN 16   < > 31* 31* 23*  CREATININE 1.22   < > 1.43* 1.27* 0.95  CALCIUM 9.2   < > 9.4 9.3 9.3  PROT 7.0  --   --   --   --  ALBUMIN 3.7  --   --   --   --   AST 60*  --   --   --   --   ALT 35  --   --   --   --   ALKPHOS 27*  --   --   --   --   BILITOT 0.8  --   --   --   --   GFRNONAA 55*   < > 45* 52* >60  GFRAA >60   < > 53* >60 >60  ANIONGAP 11   < > 10 7 6    < > = values in this interval not displayed.     Hematology Recent Labs  Lab 02/02/17 0520 02/03/17 1051 02/05/17 0544  WBC 14.0* 12.8* 8.6  RBC 4.21* 4.48 4.10*  HGB 13.5 14.6 12.8*  HCT 41.9 43.9 39.9  MCV 99.5 98.0 97.3  MCH 32.1 32.6 31.2  MCHC 32.2 33.3 32.1  RDW 14.3 13.7 13.4  PLT 171 182 159    Cardiac Enzymes Recent Labs  Lab 01/31/17 1208 01/31/17 1822 02/01/17 0009  TROPONINI 0.22* 6.34* 6.04*    No results for input(s): TROPIPOC in the last 168 hours.   BNP No results for input(s): BNP, PROBNP in the last 168 hours.   DDimer No results for input(s): DDIMER in the last 168 hours.   Radiology    No results found.  Cardiac Studies   ECHO  02/03/17  Left ventricle: The cavity size was normal. Wall thickness was   normal. Systolic  function was moderately reduced. The estimated   ejection fraction was in the range of 35% to 40%. Akinesis of the   apical myocardium. Doppler parameters are consistent with   abnormal left ventricular relaxation (grade 1 diastolic   dysfunction). - Aortic valve: A bioprosthesis was present. Valve area (VTI): 0.7   cm^2. Valve area (Vmax): 0.72 cm^2. Valve area (Vmean): 0.67   cm^2. - Mitral valve: There was mild regurgitation. - Left atrium: The atrium was mildly dilated.  CATH  02/05/17  Patency of the left main, left circumflex, and RCA (including the RCA stented segment) with mild diffuse calcific nonobstructive stenosis Severe calcific LAD/first diagonal stenosis unchanged from the previous study in 2016 Severe bioprosthetic aortic valve stenosis with a mean gradient of 40 mmHg and calculated aortic valve area of 0.54 cm   Patient Profile     76 y.o. male w/ hx AS>>TAVR 2016, RCA stent 2016, COPD on home O2, HTN, HLD, PAD, GERD, intestinal angiodysplasia, was admitted 11/11 w/ resp failure, rx w/ nebs and Lasix.   Assessment & Plan    ACUTE ON CHRONIC DIASTOLIC HF:   Down 2.6 liters. Creatinine improved with holding lasix - LVEDP on cath yesterday on 11/16 19 mmHg. Will need more diuresis. Baseline weight 135-140 lbs, today 144 lbs, Start lasix 40 mg po BID. Home dose 20 mg po Lasix.   AKI:  Improved creatinine with holding lasix, but remains in CHF.  ELEVATED TROPONIN:    Right and left heart cath shows no significant progression of CAD - there is severe bioprosthetic valve stenosis with mean gradient of 40 mmHg.  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  There is severe AS with mean gradient of 40 mmHg. Without options for redo valve replacement, has a high risk  of short term  mortality in <1 year. Would be appropriate for a palliative care evaluation.  PAF:  NSR.  RESPIRATORY FAILURE:  Multifactorial - including COPD and CHF. Continue therapy per primary team.    Ena Dawley, MD  02/07/2017, 10:57 AM

## 2017-02-08 DIAGNOSIS — Z7189 Other specified counseling: Secondary | ICD-10-CM

## 2017-02-08 LAB — GLUCOSE, CAPILLARY
Glucose-Capillary: 110 mg/dL — ABNORMAL HIGH (ref 65–99)
Glucose-Capillary: 128 mg/dL — ABNORMAL HIGH (ref 65–99)

## 2017-02-08 MED ORDER — SALINE SPRAY 0.65 % NA SOLN: 1.0000 | NASAL | 0 refills | Status: DC | PRN

## 2017-02-08 MED ORDER — THIAMINE HCL 100 MG PO TABS: 100.0000 mg | ORAL_TABLET | Freq: Every day | ORAL | 0 refills | Status: DC

## 2017-02-08 MED ORDER — SALINE SPRAY 0.65 % NA SOLN
1.0000 | NASAL | Status: DC | PRN
  Filled 2017-02-08: qty 44

## 2017-02-08 MED ORDER — FUROSEMIDE 20 MG PO TABS: 40.0000 mg | ORAL_TABLET | Freq: Every day | ORAL | 1 refills | Status: DC

## 2017-02-08 MED ORDER — BENZONATATE 100 MG PO CAPS: 100.0000 mg | ORAL_CAPSULE | Freq: Two times a day (BID) | ORAL | 0 refills | Status: DC

## 2017-02-08 MED ORDER — MORPHINE SULFATE 10 MG/5ML PO SOLN: 2.5000 mg | Freq: Four times a day (QID) | ORAL | 0 refills | Status: DC | PRN

## 2017-02-08 MED ORDER — FOLIC ACID 1 MG PO TABS: 1.0000 mg | ORAL_TABLET | Freq: Every day | ORAL | 1 refills | Status: DC

## 2017-02-08 MED ORDER — MORPHINE SULFATE 10 MG/5ML PO SOLN: 2.5000 mg | ORAL | Status: DC | PRN

## 2017-02-08 NOTE — Progress Notes (Signed)
Daily Progress Note   Patient Name: Ronald Bell       Date: 02/08/2017 DOB: [birth year]  Age: 76 y.o. MRN#: [id number] Attending Physician: Desiree Hane, MD Primary Care Physician: Carollee Herter, Alferd Apa, DO Admit Date: 01/31/2017  Reason for Consultation/Follow-up: Establishing goals of care  Subjective: Patient sitting up in bed. States he is going home this afternoon. He plans on discussing his prognosis with his wife this evening.  We discussed his GOC and trajectory of illness. He states he would not want to return to the hospital if he could control symptoms at home. We discussed using morphine for SOB symptoms at home and to improve his ability to tolerate activity. He would like to try this. He asked about what length of time he might have. Using the surprise method we discussed that his prognosis is likely months. I discussed Hospice services and how they may benefit him and his family at home. He is interested in discussing this with [facility] when he returns home. I let him know that I had contacted [facility] and they will be visiting him and his wife to further discuss his [place] and provide assistance. ROS  Length of Stay: 8  Current Medications: Scheduled Meds:  . aspirin EC  81 mg Oral Daily  . atorvastatin  40 mg Oral q1800  . benzonatate  100 mg Oral BID  . bisoprolol  2.5 mg Oral Daily  . citalopram  20 mg Oral Daily  . docusate sodium  100 mg Oral BID  . enoxaparin (LOVENOX) injection  40 mg Subcutaneous Q24H  . famotidine  20 mg Oral QHS  . fenofibrate  160 mg Oral Q supper  . ferrous sulfate  325 mg Oral BID  . folic acid  1 mg Oral Daily  . furosemide  40 mg Oral Daily  . guaiFENesin  600 mg Oral BID  . insulin aspart  0-15 Units  Subcutaneous TID WC  . insulin aspart  0-5 Units Subcutaneous QHS  . ipratropium-albuterol  3 mL Nebulization TID  . multivitamin with minerals  1 tablet Oral Daily  . potassium chloride  40 mEq Oral BID  . sodium chloride flush  3 mL Intravenous Q12H  . thiamine  100 mg Oral Daily    Continuous Infusions: . sodium chloride  PRN Meds: sodium chloride, acetaminophen **OR** acetaminophen, ALPRAZolam, guaiFENesin, ondansetron **OR** ondansetron (ZOFRAN) IV, phenol, sodium chloride, sodium chloride flush, temazepam  Physical Exam          Vital Signs: BP 122/72   Pulse 92   Temp 98.4 F (36.9 C) (Oral)   Resp 18   Ht 5\' 2"  (1.575 m)   Wt 65.4 kg (144 lb 3.2 oz)   SpO2 92%   BMI 26.37 kg/m  SpO2: SpO2: 92 % O2 Device: O2 Device: Nasal Cannula O2 Flow Rate: O2 Flow Rate (L/min): 2 L/min  Intake/output summary:   Intake/Output Summary (Last 24 hours) at 02/08/2017 1302 Last data filed at 02/08/2017 0900 Gross per 24 hour  Intake 720 ml  Output 200 ml  Net 520 ml   LBM: Last BM Date: 02/08/17 Baseline Weight: Weight: 68 kg (150 lb) Most recent weight: Weight: 65.4 kg (144 lb 3.2 oz)       Palliative Assessment/Data:  PPS: 50%     Patient Active Problem List   Diagnosis Date Noted  . Palliative care by specialist   . Acute on chronic systolic heart failure ([place])   . Acute pulmonary edema (HCC)   . Hypoxemia   . Acute congestive heart failure ([place])   . ETOH abuse 01/31/2017  . Hyperglycemia 01/31/2017  . Abnormal EKG 01/31/2017  . Pulmonary fibrosis ([place])   . Acute exacerbation of chronic obstructive pulmonary disease (COPD) ([place])   . Acute renal failure ([place])   . COPD with acute exacerbation ([place]) 07/20/2015  . Paroxysmal atrial fibrillation ([place]) 07/20/2015  . Acute on chronic diastolic HF (heart failure) ([place]) 07/20/2015  . AKI (acute kidney injury) ([place]) 07/20/2015  . COPD with exacerbation ([place]) 07/20/2015  . Mixed hyperlipidemia 05/27/2015  .  Depression 05/18/2015  . Acute on chronic respiratory failure ([place]) 05/18/2015  . Elevated lactic acid level 05/18/2015  . Chronic respiratory failure with hypoxia and hypercapnia ([place]) 01/10/2015  . Pleural effusion, left   . LVH (left ventricular hypertrophy)   . AVM (arteriovenous malformation) of small bowel, acquired with hemorrhage ([place])   . History of colonic polyps   . CAD in native artery   . S/P aortic valve replacement   . Irregular heartbeat   . Pleural effusion   . Other emphysema ([place])   . CAD (coronary artery disease) 12/03/2014  . Atrial fibrillation with rapid ventricular response ([place]) 11/23/2014  . 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 Former smoker 11/05/2014  . Unstable angina ([place]) 10/19/2014  . Angiodysplasia of stomach and duodenum   . Angiodysplasia of small intestine, except duodenum without bleeding   . Melena 08/16/2014  . Preop cardiovascular exam 08/16/2014  . Aortic stenosis   . Coronary artery disease involving native coronary artery of native heart without angina pectoris   . Angiodysplasia of colon   . Severe aortic stenosis 06/21/2014  . Shortness of breath   . Aortic stenosis, severe 05/10/2014  . COPD exacerbation ([place]) 04/16/2014  . Multiple pulmonary nodules 03/02/2014  . 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 Pain of left thigh 11/14/2013  . Cough 03/27/2013  . Pleural plaque with presence of asbestos 03/27/2013  . PVD (peripheral vascular disease) ([place]) 10/18/2012  . Carotid artery stenosis 04/22/2012  . Postherpetic neuralgia 09/28/2011  . OTHER SPECIFIED DISEASE OF WHITE BLOOD CELLS 01/17/2010  . HEMOCCULT POSITIVE STOOL 01/17/2010  . Iron deficiency anemia 01/25/2009  . [facility] 01/25/2009  . ANGIODYSPLASIA-INTESTINE 01/25/2009  . PERSONAL  HX COLONIC POLYPS 01/25/2009  . ANEMIA, SECONDARY TO BLOOD LOSS 12/03/2008  . Hemorrhage of gastrointestinal tract 12/03/2008  .  UNSPECIFIED ANEMIA 12/01/2008  . GERD 11/30/2008  . Bilateral carotid bruits 05/25/2008  . POLYP OF NASAL CAVITY 07/25/2007  . OTHER SPECIFIED DISORDERS OF ARTERIES&ARTERIOLES 06/24/2007  . NECK PAIN, LEFT 06/22/2007  . Benign neoplasm of colon 08/11/2006  . Hyperlipidemia 08/11/2006  . ERECTILE DYSFUNCTION 08/11/2006  . Essential hypertension 08/11/2006  . COPD GOLD III with min reversibilty  08/11/2006  . 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 BARRETT'S ESOPHAGUS, HX OF 08/11/2006  . Cigarette smoker 08/11/2006    Palliative Care Assessment & Plan   Patient Profile: 76 y.o. male  with past medical history of aortic stenosis (s/p TAVR), COPD, GIB w/ AVM's, CHF, pulmonary fibrosis,  admitted on 01/31/2017 with acute on chronic respiratory failure requiring Bipap. Workup revealed worsening CHF d/t severe aortic stenosis. Diuresing, respiratory status now improved. No intervention for aortic stenosis and worsening TAVR function possible. Palliative medicine consulted for [place].    Assessment/Recommendations/Plan   Morphine liquid 2.5mg  po q2hr prn for SOB- use as premed for activity  [facility] to f/u with patient at home for possible transition to Hospice and to facilitate [place] conversation with patient and his spouse in their home  Patient stated he would like to discuss code status with his wife at home  Goals of Care and Additional Recommendations:  Limitations on Scope of Treatment: Avoid Hospitalization  Code Status:  Full code  Prognosis:   < 6 months  Discharge Planning:  Home with [place] was discussed with patient.  Thank you for allowing the Palliative Medicine Team to assist in the care of this patient.   Time In: 1230 Time Out: 1315 Total Time 45 mins Prolonged Time Billed No      Greater than 50%  of this time was spent counseling and coordinating care related to the above assessment and plan.  Mariana Kaufman, AGNP-C Palliative  Medicine   Please contact Palliative Medicine Team phone at [phone] for questions and concerns.

## 2017-02-08 NOTE — Consult Note (Addendum)
   [facility] Inpatient Consult   02/08/2017  Ronald Bell 03-16-1941 [id number]    Telephone call from inpatient RNCM to make [place] [facility] referral.  Discussed that Mr. Szatkowski is active with [facility] program- outpatient palliative home based program administered thru [facility].  Spoke with Cheri with [facility] to make aware that wife is confused about what services patient has.  Cheri states [facility] will continue to follow patient post discharge and will touch base with wife and patient about ongoing [facility] services as they will continue to follow.  Inpatient RNCM present during call to [facility] and aware of above.   Marthenia Rolling, MSN-Ed, RN,BSN [facility] Liaison [phone]

## 2017-02-08 NOTE — Telephone Encounter (Signed)
Pharmacy following up on refill- patient has called to follow up as well.

## 2017-02-08 NOTE — Care Management Note (Addendum)
Case Management Note  Patient Details  Name: Ronald Bell MRN: [id number] Date of Birth: 05/17/1940  Subjective/Objective: Pt presented for Acute on Chronic Respiratory Failure. PTA he is from home with his wife. Per pt he has DME: walker, shower chair and cane at home. Pt has O2 via [facility].CM did reach out to [facility] to verify orders for 02 and if needs increased liter flow with 02. [place] to check to see if referral received for RN, PT/ [employer]. Pt will need orders for Services RN, PT, Aide and F2F.                   Action/Plan: Pt did ask if CM could contact the wife and make her aware of services. CM did call the wife to make her aware of services. During conversation wife seemed a little confused  in regards to plan of care and of the services being rendered from [facility] and [facility]. CM did try to specify what each agency would provide in areas of service. CM did reach out to [place] 2/2 conversation with wife. [facility] usually do weekly visits with the patient.  Referral for CM to contact [facility] for [place] did call [facility] and they will look in Epic for referral and reach out to family. Expected admission to home 02-08-17. No further needs from CM at this time.    Expected Discharge Date:                  Expected Discharge Plan:  [place]  In-House Referral:  NA  Discharge planning Services  CM Consult  Post Acute Care Choice:  Home Health Choice offered to:  Patient, Spouse  DME Arranged:  N/A DME Agency:  NA  HH Arranged:  RN, Disease Management, PT, Nurse's Aide [place] Agency:  [place]  Status of Service:  Completed, signed off  If discussed at [place] of Stay Meetings, dates discussed:    Additional Comments: 1537 02-09-17 Late Entry: Wife called Agricultural consultant and stated unable to get Rx for Morphine Suspension filled. CM did reach out to Liaison  for [facility] [place] stated she will be able to assist with getting new Rx to patient. No further needs from CM at this time. Jacqlyn Krauss, RN,BSN Case Manager.     1111 02-08-17 Jacqlyn Krauss, RN,BSN [phone] [facility] did clarify that pt has orders for Oxygen at 2 Liters continuously. If pt needs increased liter flow new orders will need to be obtained and pulmonary sat note.  Bethena Roys, RN 02/08/2017, 10:49 AM

## 2017-02-08 NOTE — Care Management Important Message (Signed)
Important Message  Patient Details  Name: Ronald Bell MRN: [id number] Date of Birth: 1940-06-10   Medicare Important Message Given:  Yes    Nathen May 02/08/2017, 10:43 AM

## 2017-02-08 NOTE — Progress Notes (Signed)
Occupational Therapy Treatment Patient Details Name: Ronald Bell MRN: [id number] DOB: 05-17-1940 Today's Date: 02/08/2017    History of present illness Pt is a 76 y.o. male who presented to the ED with wheeing and significant worsening of shortness of breath. 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    OT comments  Pt performed ADL today at a modified independent level. Reinforced breathing techniques, energy conservation and use of his shower seat at home. Pt has not yet told his family about his prognosis, wants to wait until he goes home, hopefully today.   Follow Up Recommendations  No OT follow up    Equipment Recommendations  None recommended by OT    Recommendations for Other Services      Precautions / Restrictions Restrictions Weight Bearing Restrictions: No       Mobility Bed Mobility Overal bed mobility: Modified Independent             General bed mobility comments: HOB up  Transfers Overall transfer level: Modified independent Equipment used: None                  Balance     Sitting balance-Leahy Scale: Good       Standing balance-Leahy Scale: Good                             ADL either performed or assessed with clinical judgement   ADL Overall ADL's : Needs assistance/impaired     Grooming: Oral care;Standing;Modified independent(shaving)           Upper Body Dressing : Set up;Standing   Lower Body Dressing: Modified independent;Sit to/from stand   Toilet Transfer: Modified Independent;Ambulation   Toileting- Clothing Manipulation and Hygiene: Modified independent;Sit to/from stand   Tub/ Shower Transfer: Supervision/safety;Ambulation Tub/Shower Transfer Details (indicate cue type and reason): simulated, educated in keeping good  ventilation, sitting to shower  Functional mobility during ADLs: Modified independent(in room) General ADL Comments: Continued to reinforce energy conservation strategies and pursed lip breathing during exertion.     Vision       Perception     Praxis      Cognition Arousal/Alertness: Awake/alert Behavior During Therapy: WFL for tasks assessed/performed Overall Cognitive Status: Within Functional Limits for tasks assessed                                          Exercises     Shoulder Instructions       General Comments      Pertinent Vitals/ Pain       Pain Assessment: No/denies pain  Home Living                                          Prior Functioning/Environment              Frequency  Min 2X/week        Progress Toward Goals  OT Goals(current goals can now be found in the care plan section)  Progress towards OT goals: Progressing toward goals  Acute Rehab OT Goals Patient Stated Goal: to go  home OT Goal Formulation: With patient Time For Goal Achievement: 02/15/17 Potential to Achieve Goals: Good  Plan Discharge plan needs to be updated    Co-evaluation                 AM-PAC PT "6 Clicks" Daily Activity     Outcome Measure   Help from another person eating meals?: None Help from another person taking care of personal grooming?: None Help from another person toileting, which includes using toliet, bedpan, or urinal?: None Help from another person bathing (including washing, rinsing, drying)?: A Little Help from another person to put on and taking off regular upper body clothing?: None Help from another person to put on and taking off regular lower body clothing?: None 6 Click Score: 23    End of Session Equipment Utilized During Treatment: Gait belt  OT Visit Diagnosis: Unsteadiness on feet (R26.81)   Activity Tolerance Patient tolerated treatment well   Patient Left in chair;with call  bell/phone within reach   Nurse Communication          Time: 1779-3903 OT Time Calculation (min): 19 min  Charges: OT General Charges $OT Visit: 1 Visit OT Treatments $Self Care/Home Management : 8-22 mins  02/08/2017 Nestor Lewandowsky, OTR/L Pager: [place], [place] 02/08/2017, 12:00 PM

## 2017-02-08 NOTE — Progress Notes (Signed)
Ambulated pt 449ft without oxygen, O2 sat remained around 91-93% ambulating. 95-96% at rest. Pt stated that he only wears 2L oxygen at night.

## 2017-02-08 NOTE — Discharge Summary (Signed)
Discharge Summary  Ronald Bell UVO:[id number] DOB: [birth year]  PCP: Ann Held, DO  Admit date: 01/31/2017 Discharge date: 02/08/2017  Time spent: 25 minutes  Admitted From: Home(Home, ALF, ILF, SNF) Disposition:  Home  Recommendations for Outpatient Follow-up:  1. Follow up with PCP : Completed prednisone therapy during admission. Home prednisone discontinued on discharge; please evaluate need to continue at PCP follow up visit 2. Please obtain BMP to monitor creatinine 3. Palliative care following as outpatient given end stage severe Aortic stenosis with prosthetic valve  Home Health: Yes, Home Health PT> Palliative care to follow as outpatient  Discharge Diagnoses:  Active Hospital Problems   Diagnosis Date Noted  . Acute on chronic respiratory failure ([place]) 05/18/2015  . Goals of care, counseling/discussion   . Advance care planning   . Palliative care by specialist   . Acute on chronic systolic heart failure ([place])   . Acute pulmonary edema (H[facility])   . Hypoxemia   . Acute congestive heart failure ([place])   . ETOH abuse 01/31/2017  . Hyperglycemia 01/31/2017  . Abnormal EKG 01/31/2017  . Acute exacerbation of chronic obstructive pulmonary disease (COPD) ([place])   . Acute on chronic diastolic HF (heart failure) ([place]) 07/20/2015  . Paroxysmal atrial fibrillation ([place]) 07/20/2015  . 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 Aortic stenosis   . GERD 11/30/2008  . Essential hypertension 08/11/2006    Resolved Hospital Problems  No resolved problems to display.    Discharge Condition:Guarded  CODE STATUS:Full Code Diet recommendation: Heart Healthy  Vitals:   02/08/17 0831 02/08/17 0952  BP: 122/72   Pulse: 81 92  Resp: 15 18  Temp: 98.4 F (36.9 C)   SpO2: 100% 92%    History of present illness:  Ronald Bell is a 76 y.o. year old male with medical history significant for severe aortic stenosis s/p TAVR (10/2014),  paroxysmal atrial fibrillation ( no AC b/c recurrent GIB), CAD ( PCI ) complicated by GIB secondary to multiple AVMs,  COPD/pulmonary fibrosis who presented on 01/31/2017 with and was found to have acute on chronic hypoxic, hypercarbic respiratory failure secondary to COPD exacerbation and CHF exacerbation.  Hospital course was complicated by worsening TAVR function.   Remaining hospital course addressed in problem based format below:   Hospital Course:  Principal Problem:   Acute on chronic respiratory failure ([place]) Active Problems:   Essential hypertension   GERD   Aortic stenosis   S/P TAVR (transcatheter aortic valve replacement)   Paroxysmal atrial fibrillation (HCC)   Acute on chronic diastolic HF (heart failure) (HCC)   Acute exacerbation of chronic obstructive pulmonary disease (COPD) (HCC)   ETOH abuse   Hyperglycemia   Abnormal EKG   Acute pulmonary edema (HCC)   Hypoxemia   Acute congestive heart failure (HCC)   Acute on chronic systolic heart failure ([place])   Palliative care by specialist   Goals of care, counseling/discussion   Advance care planning   Acute on chronic hypoxic/hypercarbic respiratory failure Multifactorial etiology: COPD exacerbation, CHF exacerbation, worsening TAVR function Initially patient admitted to stepdown unit given intermittent need for BiPAP in addition to IV diuresis with Lasix and schedule inhaler therapy for COPD exacerbation.  Cardiology was initially consulted given troponin elevation to greater than 6: However, EKG was negative for acute ischemic changes.  In setting of elevated BNP worsening pulmonary edema presumed secondary to CHF exacerbation.  Pulmonary consult is recommended continue treatment of COPD with Levaquin and scheduled  nebulizer regimen.  Patient is referred status improved with scheduled inhalers, IV steroids/prednisone, and Levaquin.   -Patient was able to transition back to home oxygen requirements.  Aortic stenosis,  worsening TAVR function, prosthetic aortic valve stenosis Severe prosthetic valve stenosis CAD, elevated troponin, resolved Given patient's presentation with elevated troponin with no chest pain, TTE was obtained which showed new reduction in ejection fraction.  Left heart cath for ischemic evaluation showed no new stenosis of the coronary arteries.  TTE did show worsening mean gradient across aortic valve of 40 mmHg which was confirmed on heart cath.  Cardiology stated there were no remaining medical treatment options and high risk of short term mortality in less than 1 year: So palliative care was consulted.  Patient tolerated continue diuresis with oral Lasix. -Patient will continue Lasix 40 mg daily -Patient has tenuous volume status due to CHF and preload dependent aortic stenosis -Morphine liquid every 2-4 hours as needed for shortness of breath per -Care recommendations -Care connections palliative to follow with patient at home for possible transition to hospice and to facilitate goals of care conversation with patient and his spouse in their home -Patient stated he would like to discuss CODE STATUS with his wife at home   Acute on chronic combined systolic and diastolic CHF Patient admitted obvious signs of hypovolemic status which improved greatly with IV diuresis.  Patient tolerated transition to oral Lasix.  New reduction in ejection fraction found on TTE.  Left heart cath with no concern for ischemic disease however did show worsening aortic stenosis in setting of bioprosthetic heart valve. -Lasix regimen 40 mg daily -Continue home bisoprolol -Patient's dry weight 144 pounds, creatinine on discharge 0.95  Consultations:  Cardiology, palliative care   Procedures/Studies:  TTE 02/03/2017: Left ventricle: The cavity size was normal. Wall thickness was normal. Systolic function was moderately reduced. The estimated ejection fraction was in the range of 35% to 40%. Akinesis of  the apical myocardium. Doppler parameters are consistent with abnormal left ventricular relaxation (grade 1 diastolic dysfunction). - Aortic valve: A bioprosthesis was present. Valve area (VTI): 0.7 cm^2. Valve area (Vmax): 0.72 cm^2. Valve area (Vmean): 0.67 cm^2. - Mitral valve: There was mild regurgitation. - Left atrium: The atrium was mildly dilated.   Left heart cath 02/05/2017:  Patency of the left main, left circumflex, and RCA (including the RCA stented segment) with mild diffuse calcific nonobstructive stenosis Severe calcific LAD/first diagonal stenosis unchanged from the previous study in 2016 Severe bioprosthetic aortic valve stenosis with a mean gradient of 40 mmHg and calculated aortic valve area of 0.54 cm   Dg Chest Port 1 View  Result Date: 02/01/2017 CLINICAL DATA:  Increasing shortness of breath with cough. EXAM: PORTABLE CHEST 1 VIEW COMPARISON:  01/31/2017. FINDINGS: Stable enlarged cardiomediastinal silhouette. Emphysematous change with pulmonary fibrosis, stable. Improved interstitial edema. No consolidation or pneumothorax. Previous ORIF LEFT rib fracture. Stable pleural calcifications along the diaphragm. IMPRESSION: Improved interstitial edema.  No consolidation or pneumothorax. Electronically Signed   By: Staci Righter M.D.   On: 02/01/2017 08:53   Dg Chest Port 1 View  Result Date: 01/31/2017 CLINICAL DATA:  Shortness of breath. EXAM: PORTABLE CHEST 1 VIEW COMPARISON:  August 26, 2016 chest radiograph and chest CT Jul 23, 2015 FINDINGS: Lungs are hyperexpanded. There is extensive fibrosis throughout the lungs. There is bullous disease in the upper lobes. In comparison with most recent prior study, there is increase in interstitial prominence in the lower lung zones, likely interstitial edema  superimposed on fibrosis. There is no appreciable airspace consolidation. There are calcified pleural plaques, stable. There is also calcification along the right  hemidiaphragm. Of heart is mildly enlarged. There appears to be a degree of relative pulmonary venous hypertension. There is aortic atherosclerosis. There is an aortic valve replacement. No adenopathy is evident. There is postoperative change involving a lower left rib. IMPRESSION: There is underlying emphysematous change with pulmonary fibrosis. There is increase in interstitial prominence, likely interstitial pulmonary edema superimposed on emphysema. Suspect congestive heart failure with underlying emphysema and fibrosis. There also calcified pleural plaques and calcification along the right hemidiaphragm indicative of prior asbestos exposure. There is aortic atherosclerosis. Patient is status post aortic valve replacement. Aortic Atherosclerosis (ICD10-I70.0). Electronically Signed   By: Lowella Grip III M.D.   On: 01/31/2017 10:30     Discharge Exam: BP 122/72   Pulse 92   Temp 98.4 F (36.9 C) (Oral)   Resp 18   Ht 5\' 2"  (1.575 m)   Wt 65.4 kg (144 lb 3.2 oz)   SpO2 92%   BMI 26.37 kg/m   General: Sitting in bed comfortably, in no distress Cardiovascular: No peripheral edema, positive systolic murmur, regular rate and rhythm Respiratory: Minimal crackles at bases, decreased breath sounds, no wheezing or rales   Discharge Instructions You were cared for by a hospitalist during your hospital stay. If you have any questions about your discharge medications or the care you received while you were in the hospital after you are discharged, you can call the unit and asked to speak with the hospitalist on call if the hospitalist that took care of you is not available. Once you are discharged, your primary care physician will handle any further medical issues. Please note that NO REFILLS for any discharge medications will be authorized once you are discharged, as it is imperative that you return to your primary care physician (or establish a relationship with a primary care physician if you do  not have one) for your aftercare needs so that they can reassess your need for medications and monitor your lab values.  Discharge Instructions    Diet - low sodium heart healthy   Complete by:  As directed    Increase activity slowly   Complete by:  As directed      Allergies as of 02/08/2017   No Known Allergies     Medication List    STOP taking these medications   predniSONE 20 MG tablet Commonly known as:  DELTASONE     TAKE these medications   albuterol 108 (90 Base) MCG/ACT inhaler Commonly known as:  PROVENTIL HFA;VENTOLIN HFA Inhale 2 puffs into the lungs every 6 (six) hours as needed for wheezing or shortness of breath (Plan B).   ALPRAZolam 0.5 MG tablet Commonly known as:  XANAX TAKE 1 TABLET BY MOUTH THREE TIMES DAILY AS NEEDED FOR ANXIETY   aspirin 81 MG tablet Take 81 mg by mouth every morning.   atorvastatin 40 MG tablet Commonly known as:  LIPITOR Take 1 tablet (40 mg total) by mouth daily. What changed:  Another medication with the same name was removed. Continue taking this medication, and follow the directions you see here.   benzonatate 100 MG capsule Commonly known as:  TESSALON Take 1 capsule (100 mg total) 2 (two) times daily by mouth.   bisoprolol 5 MG tablet Commonly known as:  ZEBETA Take 0.5 tablets (2.5 mg total) by mouth daily.   BROVANA 15 MCG/2ML Nebu  Generic drug:  arformoterol USE 1 VIAL IN NEBULIZER TWICE DAILY   budesonide 0.5 MG/2ML nebulizer solution Commonly known as:  PULMICORT USE ONE VIAL (2ML) IN NEBULIZER TWICE DAILY   CENTRUM SILVER PO Take 1 tablet by mouth daily.   citalopram 20 MG tablet Commonly known as:  CELEXA Take 1 tablet (20 mg total) by mouth daily.   docusate sodium 100 MG capsule Commonly known as:  COLACE Take 100-200 mg by mouth See admin instructions. 1 capsule by mouth in the morning and 2 at bedtime   famotidine 20 MG tablet Commonly known as:  PEPCID Take 20 mg by mouth at bedtime.     fenofibrate 160 MG tablet Take 1 tablet (160 mg total) by mouth daily.   folic acid 1 MG tablet Commonly known as:  FOLVITE Take 1 tablet (1 mg total) daily by mouth. Start taking on:  02/09/2017   furosemide 20 MG tablet Commonly known as:  LASIX Take 2 tablets (40 mg total) daily by mouth. What changed:  how much to take   guaiFENesin 600 MG 12 hr tablet Commonly known as:  MUCINEX Take 1 tablet (600 mg total) by mouth 2 (two) times daily.   ipratropium-albuterol 0.5-2.5 (3) MG/3ML Soln Commonly known as:  DUONEB Take 3 mLs by nebulization every 4 (four) hours as needed (shortness of breath and wheezing plan C).   Iron 325 (65 Fe) MG Tabs TAKE ONE TABLET BY MOUTH TWICE DAILY What changed:  Another medication with the same name was removed. Continue taking this medication, and follow the directions you see here.   morphine 10 MG/5ML solution Take 1.3 mLs (2.6 mg total) every 6 (six) hours as needed by mouth (shortness of breath).   OXYGEN Use 2L at bedtime and as needed for shortness of breath   pantoprazole 40 MG tablet Commonly known as:  PROTONIX Take 1 tablet (40 mg total) by mouth daily.   sodium chloride 0.65 % Soln nasal spray Commonly known as:  OCEAN Place 1-2 sprays into both nostrils as needed for congestion. What changed:  Another medication with the same name was added. Make sure you understand how and when to take each.   sodium chloride 0.65 % Soln nasal spray Commonly known as:  OCEAN Place 1 spray as needed into both nostrils for congestion. What changed:  You were already taking a medication with the same name, and this prescription was added. Make sure you understand how and when to take each.   temazepam 30 MG capsule Commonly known as:  RESTORIL TAKE 1 CAPSULE BY MOUTH AT BEDTIME AS NEEDED FOR SLEEP   thiamine 100 MG tablet Take 1 tablet (100 mg total) daily by mouth. Start taking on:  02/09/2017   umeclidinium bromide 62.5 MCG/INH  Aepb Commonly known as:  INCRUSE ELLIPTA Inhale 1 puff into the lungs daily.   vitamin C 500 MG tablet Commonly known as:  ASCORBIC ACID Take 500 mg by mouth daily.      No Known Allergies Follow-up Information    [facility] Follow [facility].   Specialty:  Home Health Services Why:  Registered Nurse, Aide. Physical Therapy Contact information: [place] [address] [place], Alferd Apa, DO. Go on 02/09/2017.   Specialty:  Family Medicine Why:  Tomorrow 4:15 pm Contact information: [address] [place] [address] [phone]        Curt Jews  F, MD Follow up.   Specialties:  Vascular Surgery, Cardiology Contact information: [address] [place] [address] [phone]        [facility] Follow up.   Why:  [facility] to follow up with the patient post hospital for Goals of Care.  Contact information: [address] [place] [address] [phone]            The results of significant diagnostics from this hospitalization (including imaging, microbiology, ancillary and laboratory) are listed below for reference.    Significant Diagnostic Studies: Dg Chest Port 1 View  Result Date: 02/01/2017 CLINICAL DATA:  Increasing shortness of breath with cough. EXAM: PORTABLE CHEST 1 VIEW COMPARISON:  01/31/2017. FINDINGS: Stable enlarged cardiomediastinal silhouette. Emphysematous change with pulmonary fibrosis, stable. Improved interstitial edema. No consolidation or pneumothorax. Previous ORIF LEFT rib fracture. Stable pleural calcifications along the diaphragm. IMPRESSION: Improved interstitial edema.  No consolidation or pneumothorax. Electronically Signed   By: Staci Righter M.D.   On: 02/01/2017 08:53   Dg Chest Port 1 View  Result Date: 01/31/2017 CLINICAL DATA:  Shortness of breath. EXAM: PORTABLE CHEST 1 VIEW COMPARISON:  August 26, 2016 chest radiograph  and chest CT Jul 23, 2015 FINDINGS: Lungs are hyperexpanded. There is extensive fibrosis throughout the lungs. There is bullous disease in the upper lobes. In comparison with most recent prior study, there is increase in interstitial prominence in the lower lung zones, likely interstitial edema superimposed on fibrosis. There is no appreciable airspace consolidation. There are calcified pleural plaques, stable. There is also calcification along the right hemidiaphragm. Of heart is mildly enlarged. There appears to be a degree of relative pulmonary venous hypertension. There is aortic atherosclerosis. There is an aortic valve replacement. No adenopathy is evident. There is postoperative change involving a lower left rib. IMPRESSION: There is underlying emphysematous change with pulmonary fibrosis. There is increase in interstitial prominence, likely interstitial pulmonary edema superimposed on emphysema. Suspect congestive heart failure with underlying emphysema and fibrosis. There also calcified pleural plaques and calcification along the right hemidiaphragm indicative of prior asbestos exposure. There is aortic atherosclerosis. Patient is status post aortic valve replacement. Aortic Atherosclerosis (ICD10-I70.0). Electronically Signed   By: Lowella Grip III M.D.   On: 01/31/2017 10:30    Microbiology: Recent Results (from the past 240 hour(s))  MRSA PCR Screening     Status: None   Collection Time: 01/31/17  3:44 PM  Result Value Ref Range Status   MRSA by PCR NEGATIVE NEGATIVE Final    Comment:        The GeneXpert MRSA Assay (FDA approved for NASAL specimens only), is one component of a comprehensive MRSA colonization surveillance program. It is not intended to diagnose MRSA infection nor to guide or monitor treatment for MRSA infections.      Labs: Basic Metabolic Panel: Recent Labs  Lab 02/02/17 0520 02/03/17 1051 02/05/17 0544 02/06/17 0530  NA 140 136 136 135  K 4.4 4.2 3.9  4.6  CL 101 96* 99* 103  CO2 29 30 30 26   GLUCOSE 110* 144* 91 95  BUN 27* 31* 31* 23*  CREATININE 1.38* 1.43* 1.27* 0.95  CALCIUM 9.6 9.4 9.3 9.3   Liver Function Tests: No results for input(s): AST, ALT, ALKPHOS, BILITOT, PROT, ALBUMIN in the last 168 hours. No results for input(s): LIPASE, AMYLASE in the last 168 hours. No results for input(s): AMMONIA in the last 168 hours. CBC: Recent Labs  Lab 02/02/17 0520 02/03/17  1051 02/05/17 0544  WBC 14.0* 12.8* 8.6  NEUTROABS 12.3*  --   --   HGB 13.5 14.6 12.8*  HCT 41.9 43.9 39.9  MCV 99.5 98.0 97.3  PLT 171 182 159   Cardiac Enzymes: No results for input(s): CKTOTAL, CKMB, CKMBINDEX, TROPONINI in the last 168 hours. BNP: BNP (last 3 results) Recent Labs    09/05/16 1053 01/31/17 1055  BNP 302.8* 986.0*    ProBNP (last 3 results) No results for input(s): PROBNP in the last 8760 hours.  CBG: Recent Labs  Lab 02/07/17 1129 02/07/17 1709 02/07/17 2015 02/08/17 0751 02/08/17 1156  GLUCAP 132* 107* 145* 110* 128*       Signed:  Desiree Hane, MD [facility] 02/08/2017, 3:31 PM

## 2017-02-08 NOTE — Progress Notes (Signed)
Progress Note  Patient Name: Ronald Bell Date of Encounter: 02/08/2017  Primary Cardiologist: Dr. Marlou Porch   Subjective   No chest pain and no SOB, no edema  Inpatient Medications    Scheduled Meds: . aspirin EC  81 mg Oral Daily  . atorvastatin  40 mg Oral q1800  . benzonatate  100 mg Oral BID  . bisoprolol  2.5 mg Oral Daily  . citalopram  20 mg Oral Daily  . docusate sodium  100 mg Oral BID  . enoxaparin (LOVENOX) injection  40 mg Subcutaneous Q24H  . famotidine  20 mg Oral QHS  . fenofibrate  160 mg Oral Q supper  . ferrous sulfate  325 mg Oral BID  . folic acid  1 mg Oral Daily  . furosemide  40 mg Oral Daily  . guaiFENesin  600 mg Oral BID  . insulin aspart  0-15 Units Subcutaneous TID WC  . insulin aspart  0-5 Units Subcutaneous QHS  . ipratropium-albuterol  3 mL Nebulization TID  . multivitamin with minerals  1 tablet Oral Daily  . potassium chloride  40 mEq Oral BID  . sodium chloride flush  3 mL Intravenous Q12H  . thiamine  100 mg Oral Daily   Continuous Infusions: . sodium chloride     PRN Meds: sodium chloride, acetaminophen **OR** acetaminophen, ALPRAZolam, guaiFENesin, ondansetron **OR** ondansetron (ZOFRAN) IV, phenol, sodium chloride, sodium chloride flush, temazepam   Vital Signs    Vitals:   02/07/17 1956 02/08/17 0008 02/08/17 0655 02/08/17 0831  BP: (!) 93/58 (!) 110/51 125/76 122/72  Pulse: 88 74 83 81  Resp: _0 Temp: 98.4 F (36.9 C) 98.4 F (36.9 C) 98.6 F (37 C) 98.4 F (36.9 C)  TempSrc: Oral Other (Comment) Oral Oral  SpO2:   100% 100%  Weight:   144 lb 3.2 oz (65.4 kg)   Height:        Intake/Output Summary (Last 24 hours) at 02/08/2017 0918 Last data filed at 02/08/2017 0700 Gross per 24 hour  Intake 720 ml  Output 200 ml  Net 520 ml   Filed Weights   02/06/17 0518 02/07/17 0520 02/08/17 0655  Weight: 144 lb 11.2 oz (65.6 kg) 143 lb 4.8 oz (65 kg) 144 lb 3.2 oz (65.4 kg)    Telemetry    No tele  - Personally Reviewed  ECG    No new - Personally Reviewed  Physical Exam   GEN: No acute distress.   Neck: No JVD sitting up right in chair.  Cardiac: RRR, + murmurs, rubs, or gallops.  Respiratory: Clear to auscultation bilaterally. GI: Soft, nontender, non-distended  MS: No edema; No deformity. Neuro:  Nonfocal  Psych: Normal affect   Labs    Chemistry Recent Labs  Lab 02/03/17 1051 02/05/17 0544 02/06/17 0530  NA 136 136 135  K 4.2 3.9 4.6  CL 96* 99* 103  CO2 _1 GLUCOSE 144* 91 95  BUN 31* 31* 23*  CREATININE 1.43* 1.27* 0.95  CALCIUM 9.4 9.3 9.3  GFRNONAA 45* 52* >60  GFRAA 53* >60 >60  ANIONGAP _2 Hematology Recent Labs  Lab 02/02/17 0520 02/03/17 1051 02/05/17 0544  WBC 14.0* 12.8* 8.6  RBC 4.21* 4.48 4.10*  HGB 13.5 14.6 12.8*  HCT 41.9 43.9 39.9  MCV 99.5 98.0 97.3  MCH 32.1 32.6 31.2  MCHC 32.2 33.3 32.1  RDW 14.3 13.7 13.4  PLT 171 182  159    Cardiac EnzymesNo results for input(s): TROPONINI in the last 168 hours. No results for input(s): TROPIPOC in the last 168 hours.   BNPNo results for input(s): BNP, PROBNP in the last 168 hours.   DDimer No results for input(s): DDIMER in the last 168 hours.   Radiology    No results found.  Cardiac Studies   ECHO  02/03/17  Left ventricle: The cavity size was normal. Wall thickness was normal. Systolic function was moderately reduced. The estimated ejection fraction was in the range of 35% to 40%. Akinesis of the apical myocardium. Doppler parameters are consistent with abnormal left ventricular relaxation (grade 1 diastolic dysfunction). - Aortic valve: A bioprosthesis was present. Valve area (VTI): 0.7 cm^2. Valve area (Vmax): 0.72 cm^2. Valve area (Vmean): 0.67 cm^2. - Mitral valve: There was mild regurgitation. - Left atrium: The atrium was mildly dilated.  CATH  02/05/17  Patency of the left main, left circumflex, and RCA (including the RCA  stented segment) with mild diffuse calcific nonobstructive stenosis Severe calcific LAD/first diagonal stenosis unchanged from the previous study in 2016 Severe bioprosthetic aortic valve stenosis with a mean gradient of 40 mmHg and calculated aortic valve area of 0.54 cm    Patient Profile     76 y.o. male w/ hx AS>>TAVR 2016, RCA stent 2016, COPD on home O2, HTN, HLD, PAD, GERD, intestinal angiodysplasia, was admitted 11/11 w/ resp failure, rx w/ nebs and Lasix.   Assessment & Plan    Acute on chronic diastolic HF  LVEDP with cath on the 16th of 19 mmHg with baseline wt 135-140 lbs   --negative 807 since admit ( was up 280 ml yesterday) wt down from 149 to 144 lbs.  --no longer on tele.  CKD -3 with rise in Cr -lasix held with increasing fluid.  Now back on lasix 40 mg daily home dose was 20 mg daily   Elevated troponin with Rt and lt cath with no significant progression of CAD --there is severe bioprosthetic valve stenosis with mean gradient of 40 mmHg  AS/TAVR--Prosthetic valve AS related to TAVR thrombosis which could not be treated with anticoagulation now with progressive valve dysfunction but no options for further medication management with anticoagulation or procedural solution.  There is severe AS with mean gradient of 40 mmHg. Without options for redo valve replacement, has a high risk of short term mortality in <1 year.   PAF  SR   Respiratory failure --multifactorial with COPD.  Per PCP  Palliative care met with pt yesterday and to see again today.   For questions or updates, please contact [place] Please consult [email or web address] for contact info under Cardiology/STEMI.      Signed, Cecilie Kicks, NP  02/08/2017, 9:18 AM

## 2017-02-09 ENCOUNTER — Telehealth: Payer: Self-pay

## 2017-02-09 ENCOUNTER — Inpatient Hospital Stay: Payer: Medicare Other | Admitting: Family Medicine

## 2017-02-09 NOTE — Telephone Encounter (Signed)
[facility] [address] requesting temazepam  Database ran is on your desk for review  Last filled per database:  01/09/17 Last written: 12/04/16 Last ov: 11/24/16 Next ov: 02/16/17 Contract: none UDS: none

## 2017-02-09 NOTE — Telephone Encounter (Signed)
02/09/17  TCM Hospital Follow Up  Transition Care Management Follow-up Telephone Call  ADMISSION DATE: 01/31/2017  DISCHARGE DATE: 02/08/2017   How have you been since you were released from the hospital? Patient states he has been feeling fine since discharge   Do you understand why you were in the hospital? Yes per patient.   Do you understand the discharge instrcutions? Yes, per patient    Items Reviewed:  Medications reviewed: Reviewed with patient   Allergies reviewed: NKDA   Dietary changes reviewed: Low sodium heart healthy.   Referrals reviewed: Yes appointment rescheduled with Dr. Carollee Herter and appointment has been scheduled with Cardiology   Functional Questionnaire:   Activities of Daily Living (ADLs): Patient states he is independent with all except bathing he has assistance from his wife.  Any patient concerns?  None at this time   Confirmed importance and date/time of follow-up visits scheduled:Yes   Confirmed with patient if condition begins to worsen call PCP or go to the ER. Yes    Patient was given the office number and encouragred to call back with questions or concerns. Yes

## 2017-02-10 ENCOUNTER — Telehealth: Payer: Self-pay | Admitting: Family Medicine

## 2017-02-10 NOTE — Telephone Encounter (Signed)
Spoke w/ Manus Gunning, verbal given for PCP to be attending provider.

## 2017-02-10 NOTE — Telephone Encounter (Signed)
Copied from [place] [phone]. Topic: Quick Communication - See Telephone Encounter >> Feb 10, 2017 10:47 AM Antonieta Iba C wrote: CRM for notification. See Telephone encounter for:  02/10/17.    Manus Gunning from hospice care [phone] called in to make provider aware that pt will be under hospice care. She would like to know if provider will still be pt's attending provider?    Please advise.

## 2017-02-14 DIAGNOSIS — I110 Hypertensive heart disease with heart failure: Secondary | ICD-10-CM | POA: Diagnosis not present

## 2017-02-14 DIAGNOSIS — I2510 Atherosclerotic heart disease of native coronary artery without angina pectoris: Secondary | ICD-10-CM | POA: Diagnosis not present

## 2017-02-14 DIAGNOSIS — J8410 Pulmonary fibrosis, unspecified: Secondary | ICD-10-CM | POA: Diagnosis not present

## 2017-02-14 DIAGNOSIS — F419 Anxiety disorder, unspecified: Secondary | ICD-10-CM | POA: Diagnosis not present

## 2017-02-14 DIAGNOSIS — I5033 Acute on chronic diastolic (congestive) heart failure: Secondary | ICD-10-CM | POA: Diagnosis not present

## 2017-02-14 DIAGNOSIS — Z9981 Dependence on supplemental oxygen: Secondary | ICD-10-CM | POA: Diagnosis not present

## 2017-02-14 DIAGNOSIS — I350 Nonrheumatic aortic (valve) stenosis: Secondary | ICD-10-CM | POA: Diagnosis not present

## 2017-02-14 DIAGNOSIS — Z87891 Personal history of nicotine dependence: Secondary | ICD-10-CM | POA: Diagnosis not present

## 2017-02-14 DIAGNOSIS — D509 Iron deficiency anemia, unspecified: Secondary | ICD-10-CM | POA: Diagnosis not present

## 2017-02-14 DIAGNOSIS — K31819 Angiodysplasia of stomach and duodenum without bleeding: Secondary | ICD-10-CM | POA: Diagnosis not present

## 2017-02-14 DIAGNOSIS — Z7982 Long term (current) use of aspirin: Secondary | ICD-10-CM | POA: Diagnosis not present

## 2017-02-14 DIAGNOSIS — Z952 Presence of prosthetic heart valve: Secondary | ICD-10-CM | POA: Diagnosis not present

## 2017-02-14 DIAGNOSIS — J441 Chronic obstructive pulmonary disease with (acute) exacerbation: Secondary | ICD-10-CM | POA: Diagnosis not present

## 2017-02-14 DIAGNOSIS — I480 Paroxysmal atrial fibrillation: Secondary | ICD-10-CM | POA: Diagnosis not present

## 2017-02-14 DIAGNOSIS — I739 Peripheral vascular disease, unspecified: Secondary | ICD-10-CM | POA: Diagnosis not present

## 2017-02-15 ENCOUNTER — Telehealth: Payer: Self-pay | Admitting: Internal Medicine

## 2017-02-15 ENCOUNTER — Ambulatory Visit: Payer: Medicare Other | Admitting: Thoracic Surgery (Cardiothoracic Vascular Surgery)

## 2017-02-15 ENCOUNTER — Telehealth: Payer: Self-pay | Admitting: *Deleted

## 2017-02-15 MED ORDER — BUDESONIDE-FORMOTEROL FUMARATE 160-4.5 MCG/ACT IN AERO: 2.0000 | INHALATION_SPRAY | Freq: Two times a day (BID) | RESPIRATORY_TRACT | 3 refills | Status: DC

## 2017-02-15 NOTE — Telephone Encounter (Signed)
My notes say he can use hfa so rec d/c budesonide/ formoterol and use symbicort 160 2bid , no need for spiriva

## 2017-02-15 NOTE — Telephone Encounter (Signed)
Spoke with Dr. Estill Cotta. She works with the [employer] with [facility], which the patient is involved with. She stated that over the past few months, the patient has been having some serious heart problems. 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   She stated that she was reviewing the patient's medication list and saw that the patient was taking several medications not approved or on his formulary for COPD. Brovana, Pulmicort and Incruse were the medications she mentioned. She stated that they usual having patients with known cardiac problems and COPD to use DuoNebs, but if inhalers were absolutely necessary, they recommend Symbicort and Spiriva.   She spoke to the patient about changing his medications and he has refused changing them until Dr. [name] is aware and agrees with changing the medications.   MW, please advise. Thanks!

## 2017-02-15 NOTE — Telephone Encounter (Signed)
Pt is aware of MW's recommendations and voiced his understanding.  Rx for symbicort 160 has been sent to preferred pharmacy.  Nothing further needed.

## 2017-02-15 NOTE — Telephone Encounter (Signed)
We can make the changes but would need ov to be sure he uses the new meds correctly

## 2017-02-15 NOTE — Telephone Encounter (Signed)
Spoke with pt's wife, he is not able to come in and they are trying to just keep him comfortable. FYI MW.

## 2017-02-16 ENCOUNTER — Inpatient Hospital Stay: Payer: Medicare Other | Admitting: Family Medicine

## 2017-02-16 ENCOUNTER — Telehealth: Payer: Self-pay | Admitting: *Deleted

## 2017-02-16 NOTE — Telephone Encounter (Signed)
Received Physician Orders for COPD/PNA Pulmonary Medication Protocol from [facility]; forwarded to provider/SLS 11/27

## 2017-02-16 NOTE — Telephone Encounter (Signed)
Received Physician Orders from [facility]; forwarded to provider/SLS 11/27  

## 2017-02-17 NOTE — Telephone Encounter (Signed)
Called Dagoberto Ligas at [facility] [phone] and left message that they need to send paperwork to patients pulmonologist  Dr. [name].

## 2017-02-18 DIAGNOSIS — I2510 Atherosclerotic heart disease of native coronary artery without angina pectoris: Secondary | ICD-10-CM | POA: Diagnosis not present

## 2017-02-18 DIAGNOSIS — I5033 Acute on chronic diastolic (congestive) heart failure: Secondary | ICD-10-CM | POA: Diagnosis not present

## 2017-02-18 DIAGNOSIS — J8410 Pulmonary fibrosis, unspecified: Secondary | ICD-10-CM | POA: Diagnosis not present

## 2017-02-18 DIAGNOSIS — K31819 Angiodysplasia of stomach and duodenum without bleeding: Secondary | ICD-10-CM | POA: Diagnosis not present

## 2017-02-18 DIAGNOSIS — J441 Chronic obstructive pulmonary disease with (acute) exacerbation: Secondary | ICD-10-CM | POA: Diagnosis not present

## 2017-02-18 DIAGNOSIS — I110 Hypertensive heart disease with heart failure: Secondary | ICD-10-CM | POA: Diagnosis not present

## 2017-02-24 ENCOUNTER — Telehealth: Payer: Self-pay | Admitting: Internal Medicine

## 2017-02-24 NOTE — Telephone Encounter (Signed)
PA Initiated - [id number] The form was faxed over today

## 2017-03-02 DIAGNOSIS — I110 Hypertensive heart disease with heart failure: Secondary | ICD-10-CM | POA: Diagnosis not present

## 2017-03-02 DIAGNOSIS — K31819 Angiodysplasia of stomach and duodenum without bleeding: Secondary | ICD-10-CM | POA: Diagnosis not present

## 2017-03-02 DIAGNOSIS — I5033 Acute on chronic diastolic (congestive) heart failure: Secondary | ICD-10-CM | POA: Diagnosis not present

## 2017-03-02 DIAGNOSIS — J441 Chronic obstructive pulmonary disease with (acute) exacerbation: Secondary | ICD-10-CM | POA: Diagnosis not present

## 2017-03-02 DIAGNOSIS — J8410 Pulmonary fibrosis, unspecified: Secondary | ICD-10-CM | POA: Diagnosis not present

## 2017-03-02 DIAGNOSIS — I2510 Atherosclerotic heart disease of native coronary artery without angina pectoris: Secondary | ICD-10-CM | POA: Diagnosis not present

## 2017-03-03 ENCOUNTER — Telehealth: Payer: Self-pay | Admitting: *Deleted

## 2017-03-03 NOTE — Telephone Encounter (Signed)
Copied from [place] [phone]. Topic: General - Other >> Mar 02, 2017  3:38 PM Carolyn Stare wrote:    Sharee Pimple a PT  with  [facility] call to ask for verbal orders      2 times a week for 2 weeks for fall risk reduction   [phone]

## 2017-03-04 DIAGNOSIS — I5033 Acute on chronic diastolic (congestive) heart failure: Secondary | ICD-10-CM | POA: Diagnosis not present

## 2017-03-04 DIAGNOSIS — J8410 Pulmonary fibrosis, unspecified: Secondary | ICD-10-CM | POA: Diagnosis not present

## 2017-03-04 DIAGNOSIS — I2510 Atherosclerotic heart disease of native coronary artery without angina pectoris: Secondary | ICD-10-CM | POA: Diagnosis not present

## 2017-03-04 DIAGNOSIS — I110 Hypertensive heart disease with heart failure: Secondary | ICD-10-CM | POA: Diagnosis not present

## 2017-03-04 DIAGNOSIS — J441 Chronic obstructive pulmonary disease with (acute) exacerbation: Secondary | ICD-10-CM | POA: Diagnosis not present

## 2017-03-04 DIAGNOSIS — K31819 Angiodysplasia of stomach and duodenum without bleeding: Secondary | ICD-10-CM | POA: Diagnosis not present

## 2017-03-04 NOTE — Telephone Encounter (Signed)
Left message on machine with verbal orders. 

## 2017-03-05 ENCOUNTER — Other Ambulatory Visit: Payer: Self-pay | Admitting: Family Medicine

## 2017-03-05 ENCOUNTER — Telehealth: Payer: Self-pay

## 2017-03-05 DIAGNOSIS — F411 Generalized anxiety disorder: Secondary | ICD-10-CM

## 2017-03-05 NOTE — Telephone Encounter (Signed)
Received [place] from [facility]- form signed and faxed to [phone]. Form sent for scanning.

## 2017-03-08 ENCOUNTER — Telehealth: Payer: Self-pay | Admitting: Family Medicine

## 2017-03-08 NOTE — Telephone Encounter (Signed)
Copied from [place] [id number]. Topic: Quick Communication - See Telephone Encounter >> Mar 08, 2017  4:25 PM Vernona Rieger wrote: CRM for notification. See Telephone encounter for:   03/08/17.  Georgina Peer called from [facility] and stated that he missed his home visit today. He did not want to do any therapy until he has his hospice eval. Her call back is [phone]

## 2017-03-08 NOTE — Telephone Encounter (Signed)
fyi

## 2017-03-09 NOTE — Telephone Encounter (Signed)
Pt is requesting refill on alprazolam 0.5mg .  Last OV: 11/24/2016 Last Fill: 01/12/2017 #90 and 1RF UDS: None  NCCR-unable to access at this time  Please advise.

## 2017-03-09 NOTE — Telephone Encounter (Signed)
Need database Need uds If contract not done in the last year -- need contract

## 2017-03-10 NOTE — Telephone Encounter (Signed)
Pt is going to call in the morning to set up lab appointment

## 2017-03-11 DIAGNOSIS — J8410 Pulmonary fibrosis, unspecified: Secondary | ICD-10-CM | POA: Diagnosis not present

## 2017-03-11 DIAGNOSIS — I5033 Acute on chronic diastolic (congestive) heart failure: Secondary | ICD-10-CM | POA: Diagnosis not present

## 2017-03-11 DIAGNOSIS — I110 Hypertensive heart disease with heart failure: Secondary | ICD-10-CM | POA: Diagnosis not present

## 2017-03-11 DIAGNOSIS — K31819 Angiodysplasia of stomach and duodenum without bleeding: Secondary | ICD-10-CM | POA: Diagnosis not present

## 2017-03-11 DIAGNOSIS — J441 Chronic obstructive pulmonary disease with (acute) exacerbation: Secondary | ICD-10-CM | POA: Diagnosis not present

## 2017-03-11 DIAGNOSIS — I2510 Atherosclerotic heart disease of native coronary artery without angina pectoris: Secondary | ICD-10-CM | POA: Diagnosis not present

## 2017-03-12 ENCOUNTER — Telehealth: Payer: Self-pay

## 2017-03-12 NOTE — Telephone Encounter (Signed)
Copied from [place] [phone]. Topic: General - Other >> Mar 12, 2017  4:03 PM Bea Graff, NT wrote: Reason for CRM: Enid Derry from [facility] calling and stated that the pt does not want physical therapy that he states he is going to have hospice care. CB#: [phone]

## 2017-03-12 NOTE — Telephone Encounter (Signed)
Wife states patient is unable to come in, having trouble walking

## 2017-03-12 NOTE — Telephone Encounter (Signed)
Called pt and advised message from the provider. Pt understood and verbalized understanding. Nothing further is needed.    

## 2017-03-12 NOTE — Telephone Encounter (Signed)
Called [facility] to follow up on PA. PA for brovana was denied. Meds on formulary are as follows:  Advair HSA Anoro Ellipta Breo Ellipta Serevent   Will route to [facility] and Dr. [name].

## 2017-03-12 NOTE — Telephone Encounter (Signed)
Called Pt 2/3 days ago while he was out and advised him to come in to office to sign controlled contract and get UDS. Pt laughed and stated he'd call the following morning. No call came in. Pt has to have a signed contract and UDS on file to get any controlled medication per protocol.

## 2017-03-12 NOTE — Telephone Encounter (Signed)
Medicare part B has no formulary per se so they must have tried it under Medicare D so should try Med B but if not able to get it that way:   best option is to just use duoneb qid rather than  prn

## 2017-03-12 NOTE — Telephone Encounter (Signed)
Copied from [place] [phone]. Topic: Quick Communication - Rx Refill/Question >> Mar 12, 2017  9:12 AM Synthia Innocent wrote: Has the patient contacted their pharmacy? Yes.     (Agent: If no, request that the patient contact the pharmacy for the refill.)   Preferred Pharmacy (with phone number or street name): [place]   Agent: Please be advised that RX refills may take up to 3 business days. We ask that you follow-up with your pharmacy. Requesting refill on ALPRAZolam (XANAX) 0.5 MG tablet

## 2017-03-15 NOTE — Telephone Encounter (Signed)
Noted  

## 2017-03-17 ENCOUNTER — Telehealth: Payer: Self-pay | Admitting: *Deleted

## 2017-03-17 NOTE — Telephone Encounter (Signed)
Rx sent in by provider.  °

## 2017-03-17 NOTE — Telephone Encounter (Signed)
Received Physician Orders from [facility]; forwarded to provider/SLS 12/26  

## 2017-03-19 DIAGNOSIS — J8410 Pulmonary fibrosis, unspecified: Secondary | ICD-10-CM | POA: Diagnosis not present

## 2017-03-19 DIAGNOSIS — I110 Hypertensive heart disease with heart failure: Secondary | ICD-10-CM | POA: Diagnosis not present

## 2017-03-19 DIAGNOSIS — I5033 Acute on chronic diastolic (congestive) heart failure: Secondary | ICD-10-CM | POA: Diagnosis not present

## 2017-03-19 DIAGNOSIS — J441 Chronic obstructive pulmonary disease with (acute) exacerbation: Secondary | ICD-10-CM | POA: Diagnosis not present

## 2017-03-19 DIAGNOSIS — K31819 Angiodysplasia of stomach and duodenum without bleeding: Secondary | ICD-10-CM | POA: Diagnosis not present

## 2017-03-19 DIAGNOSIS — I2510 Atherosclerotic heart disease of native coronary artery without angina pectoris: Secondary | ICD-10-CM | POA: Diagnosis not present

## 2017-03-25 ENCOUNTER — Telehealth: Payer: Self-pay | Admitting: *Deleted

## 2017-03-26 DIAGNOSIS — I110 Hypertensive heart disease with heart failure: Secondary | ICD-10-CM | POA: Diagnosis not present

## 2017-03-26 DIAGNOSIS — K31819 Angiodysplasia of stomach and duodenum without bleeding: Secondary | ICD-10-CM | POA: Diagnosis not present

## 2017-03-26 DIAGNOSIS — J8410 Pulmonary fibrosis, unspecified: Secondary | ICD-10-CM | POA: Diagnosis not present

## 2017-03-26 DIAGNOSIS — I2510 Atherosclerotic heart disease of native coronary artery without angina pectoris: Secondary | ICD-10-CM | POA: Diagnosis not present

## 2017-03-26 DIAGNOSIS — I5033 Acute on chronic diastolic (congestive) heart failure: Secondary | ICD-10-CM | POA: Diagnosis not present

## 2017-03-26 DIAGNOSIS — J441 Chronic obstructive pulmonary disease with (acute) exacerbation: Secondary | ICD-10-CM | POA: Diagnosis not present

## 2017-04-02 DIAGNOSIS — J8410 Pulmonary fibrosis, unspecified: Secondary | ICD-10-CM | POA: Diagnosis not present

## 2017-04-02 DIAGNOSIS — K31819 Angiodysplasia of stomach and duodenum without bleeding: Secondary | ICD-10-CM | POA: Diagnosis not present

## 2017-04-02 DIAGNOSIS — I2510 Atherosclerotic heart disease of native coronary artery without angina pectoris: Secondary | ICD-10-CM | POA: Diagnosis not present

## 2017-04-02 DIAGNOSIS — I5033 Acute on chronic diastolic (congestive) heart failure: Secondary | ICD-10-CM | POA: Diagnosis not present

## 2017-04-02 DIAGNOSIS — I110 Hypertensive heart disease with heart failure: Secondary | ICD-10-CM | POA: Diagnosis not present

## 2017-04-02 DIAGNOSIS — J441 Chronic obstructive pulmonary disease with (acute) exacerbation: Secondary | ICD-10-CM | POA: Diagnosis not present

## 2017-04-02 NOTE — Telephone Encounter (Signed)
Received Physician Orders from [facility]; forwarded to provider/SLS  

## 2017-04-04 ENCOUNTER — Other Ambulatory Visit: Payer: Self-pay | Admitting: Family Medicine

## 2017-04-07 ENCOUNTER — Other Ambulatory Visit: Payer: Self-pay | Admitting: Family Medicine

## 2017-04-07 NOTE — Telephone Encounter (Signed)
Pt called back in to be advised. Pt says that he has been without his sleeping medication for 4 days.   Please assist further.

## 2017-04-07 NOTE — Telephone Encounter (Deleted)
Last RX:01/22/17 Last OV: Next OV: UDS: CSC: CSR:

## 2017-04-07 NOTE — Telephone Encounter (Signed)
Controlled substance 

## 2017-04-07 NOTE — Telephone Encounter (Signed)
Patient called to follow up on his med request. Chart states that the script has been printed however it is not in the filing for patient to pick up. Patient would now like the script to be sent in. No documentation of status of script. Please advise.

## 2017-04-07 NOTE — Telephone Encounter (Signed)
Copied from [place] [id number]. Topic: Quick Communication - See Telephone Encounter >> Apr 07, 2017 11:14 AM Ronald Bell wrote: CRM for notification. See Telephone encounter for:   04/07/17.  Patient said pharmacy does not have the script for temazepam (RESTORIL) 30 MG capsule  Please re send to [place], [place]

## 2017-04-08 ENCOUNTER — Telehealth: Payer: Self-pay | Admitting: *Deleted

## 2017-04-08 DIAGNOSIS — J449 Chronic obstructive pulmonary disease, unspecified: Secondary | ICD-10-CM | POA: Diagnosis not present

## 2017-04-08 DIAGNOSIS — Z952 Presence of prosthetic heart valve: Secondary | ICD-10-CM | POA: Diagnosis not present

## 2017-04-08 DIAGNOSIS — Z87891 Personal history of nicotine dependence: Secondary | ICD-10-CM | POA: Diagnosis not present

## 2017-04-08 DIAGNOSIS — I480 Paroxysmal atrial fibrillation: Secondary | ICD-10-CM | POA: Diagnosis not present

## 2017-04-08 DIAGNOSIS — I350 Nonrheumatic aortic (valve) stenosis: Secondary | ICD-10-CM | POA: Diagnosis not present

## 2017-04-08 DIAGNOSIS — I25119 Atherosclerotic heart disease of native coronary artery with unspecified angina pectoris: Secondary | ICD-10-CM | POA: Diagnosis not present

## 2017-04-08 DIAGNOSIS — I272 Pulmonary hypertension, unspecified: Secondary | ICD-10-CM | POA: Diagnosis not present

## 2017-04-08 DIAGNOSIS — K922 Gastrointestinal hemorrhage, unspecified: Secondary | ICD-10-CM | POA: Diagnosis not present

## 2017-04-08 DIAGNOSIS — I5042 Chronic combined systolic (congestive) and diastolic (congestive) heart failure: Secondary | ICD-10-CM | POA: Diagnosis not present

## 2017-04-08 NOTE — Telephone Encounter (Signed)
Copied from [place] [phone]. Topic: Quick Communication - See Telephone Encounter >> Apr 07, 2017 11:14 AM Ahmed Prima L wrote: CRM for notification. See Telephone encounter for:   04/07/17.  Patient said pharmacy does not have the script for temazepam (RESTORIL) 30 MG capsule  Please re send to [place], [place] >> Apr 07, 2017  2:22 PM Patrice Paradise wrote: Patient called about his RX again, he is requesting a call back asap @ [phone]

## 2017-04-08 NOTE — Telephone Encounter (Signed)
Patient will go pickup today

## 2017-04-10 DIAGNOSIS — I350 Nonrheumatic aortic (valve) stenosis: Secondary | ICD-10-CM | POA: Diagnosis not present

## 2017-04-10 DIAGNOSIS — J449 Chronic obstructive pulmonary disease, unspecified: Secondary | ICD-10-CM | POA: Diagnosis not present

## 2017-04-10 DIAGNOSIS — I272 Pulmonary hypertension, unspecified: Secondary | ICD-10-CM | POA: Diagnosis not present

## 2017-04-10 DIAGNOSIS — I480 Paroxysmal atrial fibrillation: Secondary | ICD-10-CM | POA: Diagnosis not present

## 2017-04-10 DIAGNOSIS — I5042 Chronic combined systolic (congestive) and diastolic (congestive) heart failure: Secondary | ICD-10-CM | POA: Diagnosis not present

## 2017-04-10 DIAGNOSIS — I25119 Atherosclerotic heart disease of native coronary artery with unspecified angina pectoris: Secondary | ICD-10-CM | POA: Diagnosis not present

## 2017-04-13 DIAGNOSIS — I5042 Chronic combined systolic (congestive) and diastolic (congestive) heart failure: Secondary | ICD-10-CM | POA: Diagnosis not present

## 2017-04-13 DIAGNOSIS — I25119 Atherosclerotic heart disease of native coronary artery with unspecified angina pectoris: Secondary | ICD-10-CM | POA: Diagnosis not present

## 2017-04-13 DIAGNOSIS — J449 Chronic obstructive pulmonary disease, unspecified: Secondary | ICD-10-CM | POA: Diagnosis not present

## 2017-04-13 DIAGNOSIS — I480 Paroxysmal atrial fibrillation: Secondary | ICD-10-CM | POA: Diagnosis not present

## 2017-04-13 DIAGNOSIS — I350 Nonrheumatic aortic (valve) stenosis: Secondary | ICD-10-CM | POA: Diagnosis not present

## 2017-04-13 DIAGNOSIS — I272 Pulmonary hypertension, unspecified: Secondary | ICD-10-CM | POA: Diagnosis not present

## 2017-04-15 DIAGNOSIS — I25119 Atherosclerotic heart disease of native coronary artery with unspecified angina pectoris: Secondary | ICD-10-CM | POA: Diagnosis not present

## 2017-04-15 DIAGNOSIS — J449 Chronic obstructive pulmonary disease, unspecified: Secondary | ICD-10-CM | POA: Diagnosis not present

## 2017-04-15 DIAGNOSIS — I5042 Chronic combined systolic (congestive) and diastolic (congestive) heart failure: Secondary | ICD-10-CM | POA: Diagnosis not present

## 2017-04-15 DIAGNOSIS — I350 Nonrheumatic aortic (valve) stenosis: Secondary | ICD-10-CM | POA: Diagnosis not present

## 2017-04-15 DIAGNOSIS — I480 Paroxysmal atrial fibrillation: Secondary | ICD-10-CM | POA: Diagnosis not present

## 2017-04-15 DIAGNOSIS — I272 Pulmonary hypertension, unspecified: Secondary | ICD-10-CM | POA: Diagnosis not present

## 2017-04-19 DIAGNOSIS — I480 Paroxysmal atrial fibrillation: Secondary | ICD-10-CM | POA: Diagnosis not present

## 2017-04-19 DIAGNOSIS — J449 Chronic obstructive pulmonary disease, unspecified: Secondary | ICD-10-CM | POA: Diagnosis not present

## 2017-04-19 DIAGNOSIS — I272 Pulmonary hypertension, unspecified: Secondary | ICD-10-CM | POA: Diagnosis not present

## 2017-04-19 DIAGNOSIS — I350 Nonrheumatic aortic (valve) stenosis: Secondary | ICD-10-CM | POA: Diagnosis not present

## 2017-04-19 DIAGNOSIS — I5042 Chronic combined systolic (congestive) and diastolic (congestive) heart failure: Secondary | ICD-10-CM | POA: Diagnosis not present

## 2017-04-19 DIAGNOSIS — I25119 Atherosclerotic heart disease of native coronary artery with unspecified angina pectoris: Secondary | ICD-10-CM | POA: Diagnosis not present

## 2017-04-21 ENCOUNTER — Telehealth: Payer: Self-pay | Admitting: *Deleted

## 2017-04-21 NOTE — Telephone Encounter (Signed)
Received Physician Orders/Plan of Care from [facility]ospice; forwarded to provider/SLS 01/30

## 2017-04-22 DIAGNOSIS — I480 Paroxysmal atrial fibrillation: Secondary | ICD-10-CM | POA: Diagnosis not present

## 2017-04-22 DIAGNOSIS — I272 Pulmonary hypertension, unspecified: Secondary | ICD-10-CM | POA: Diagnosis not present

## 2017-04-22 DIAGNOSIS — I350 Nonrheumatic aortic (valve) stenosis: Secondary | ICD-10-CM | POA: Diagnosis not present

## 2017-04-22 DIAGNOSIS — I5042 Chronic combined systolic (congestive) and diastolic (congestive) heart failure: Secondary | ICD-10-CM | POA: Diagnosis not present

## 2017-04-22 DIAGNOSIS — J449 Chronic obstructive pulmonary disease, unspecified: Secondary | ICD-10-CM | POA: Diagnosis not present

## 2017-04-22 DIAGNOSIS — I25119 Atherosclerotic heart disease of native coronary artery with unspecified angina pectoris: Secondary | ICD-10-CM | POA: Diagnosis not present

## 2017-04-23 ENCOUNTER — Telehealth: Payer: Self-pay | Admitting: *Deleted

## 2017-04-23 DIAGNOSIS — I25119 Atherosclerotic heart disease of native coronary artery with unspecified angina pectoris: Secondary | ICD-10-CM | POA: Diagnosis not present

## 2017-04-23 DIAGNOSIS — I272 Pulmonary hypertension, unspecified: Secondary | ICD-10-CM | POA: Diagnosis not present

## 2017-04-23 DIAGNOSIS — I480 Paroxysmal atrial fibrillation: Secondary | ICD-10-CM | POA: Diagnosis not present

## 2017-04-23 DIAGNOSIS — J449 Chronic obstructive pulmonary disease, unspecified: Secondary | ICD-10-CM | POA: Diagnosis not present

## 2017-04-23 DIAGNOSIS — K922 Gastrointestinal hemorrhage, unspecified: Secondary | ICD-10-CM | POA: Diagnosis not present

## 2017-04-23 DIAGNOSIS — I5042 Chronic combined systolic (congestive) and diastolic (congestive) heart failure: Secondary | ICD-10-CM | POA: Diagnosis not present

## 2017-04-23 DIAGNOSIS — Z952 Presence of prosthetic heart valve: Secondary | ICD-10-CM | POA: Diagnosis not present

## 2017-04-23 DIAGNOSIS — I350 Nonrheumatic aortic (valve) stenosis: Secondary | ICD-10-CM | POA: Diagnosis not present

## 2017-04-23 DIAGNOSIS — Z87891 Personal history of nicotine dependence: Secondary | ICD-10-CM | POA: Diagnosis not present

## 2017-04-23 NOTE — Telephone Encounter (Signed)
Received Physician Orders from Hospice via mail; forwarded to provider/SLS 02/01

## 2017-04-29 DIAGNOSIS — J449 Chronic obstructive pulmonary disease, unspecified: Secondary | ICD-10-CM | POA: Diagnosis not present

## 2017-04-29 DIAGNOSIS — I5042 Chronic combined systolic (congestive) and diastolic (congestive) heart failure: Secondary | ICD-10-CM | POA: Diagnosis not present

## 2017-04-29 DIAGNOSIS — I350 Nonrheumatic aortic (valve) stenosis: Secondary | ICD-10-CM | POA: Diagnosis not present

## 2017-04-29 DIAGNOSIS — I272 Pulmonary hypertension, unspecified: Secondary | ICD-10-CM | POA: Diagnosis not present

## 2017-04-29 DIAGNOSIS — I25119 Atherosclerotic heart disease of native coronary artery with unspecified angina pectoris: Secondary | ICD-10-CM | POA: Diagnosis not present

## 2017-04-29 DIAGNOSIS — I480 Paroxysmal atrial fibrillation: Secondary | ICD-10-CM | POA: Diagnosis not present

## 2017-04-30 DIAGNOSIS — I5042 Chronic combined systolic (congestive) and diastolic (congestive) heart failure: Secondary | ICD-10-CM | POA: Diagnosis not present

## 2017-04-30 DIAGNOSIS — J449 Chronic obstructive pulmonary disease, unspecified: Secondary | ICD-10-CM | POA: Diagnosis not present

## 2017-04-30 DIAGNOSIS — I272 Pulmonary hypertension, unspecified: Secondary | ICD-10-CM | POA: Diagnosis not present

## 2017-04-30 DIAGNOSIS — I480 Paroxysmal atrial fibrillation: Secondary | ICD-10-CM | POA: Diagnosis not present

## 2017-04-30 DIAGNOSIS — I350 Nonrheumatic aortic (valve) stenosis: Secondary | ICD-10-CM | POA: Diagnosis not present

## 2017-04-30 DIAGNOSIS — I25119 Atherosclerotic heart disease of native coronary artery with unspecified angina pectoris: Secondary | ICD-10-CM | POA: Diagnosis not present

## 2017-05-03 DIAGNOSIS — I25119 Atherosclerotic heart disease of native coronary artery with unspecified angina pectoris: Secondary | ICD-10-CM | POA: Diagnosis not present

## 2017-05-03 DIAGNOSIS — J449 Chronic obstructive pulmonary disease, unspecified: Secondary | ICD-10-CM | POA: Diagnosis not present

## 2017-05-03 DIAGNOSIS — I350 Nonrheumatic aortic (valve) stenosis: Secondary | ICD-10-CM | POA: Diagnosis not present

## 2017-05-03 DIAGNOSIS — I272 Pulmonary hypertension, unspecified: Secondary | ICD-10-CM | POA: Diagnosis not present

## 2017-05-03 DIAGNOSIS — I480 Paroxysmal atrial fibrillation: Secondary | ICD-10-CM | POA: Diagnosis not present

## 2017-05-03 DIAGNOSIS — I5042 Chronic combined systolic (congestive) and diastolic (congestive) heart failure: Secondary | ICD-10-CM | POA: Diagnosis not present

## 2017-05-05 ENCOUNTER — Telehealth: Payer: Self-pay | Admitting: Family Medicine

## 2017-05-05 NOTE — Telephone Encounter (Signed)
Called pt to reschedule appt on 05/24/17 but mailbox was full and could not leave message.

## 2017-05-06 DIAGNOSIS — I350 Nonrheumatic aortic (valve) stenosis: Secondary | ICD-10-CM | POA: Diagnosis not present

## 2017-05-06 DIAGNOSIS — I480 Paroxysmal atrial fibrillation: Secondary | ICD-10-CM | POA: Diagnosis not present

## 2017-05-06 DIAGNOSIS — I272 Pulmonary hypertension, unspecified: Secondary | ICD-10-CM | POA: Diagnosis not present

## 2017-05-06 DIAGNOSIS — I5042 Chronic combined systolic (congestive) and diastolic (congestive) heart failure: Secondary | ICD-10-CM | POA: Diagnosis not present

## 2017-05-06 DIAGNOSIS — I25119 Atherosclerotic heart disease of native coronary artery with unspecified angina pectoris: Secondary | ICD-10-CM | POA: Diagnosis not present

## 2017-05-06 DIAGNOSIS — J449 Chronic obstructive pulmonary disease, unspecified: Secondary | ICD-10-CM | POA: Diagnosis not present

## 2017-05-11 ENCOUNTER — Telehealth: Payer: Self-pay | Admitting: *Deleted

## 2017-05-11 NOTE — Telephone Encounter (Signed)
Received Physician Orders from [place]; forwarded to provider/SLS 02/19

## 2017-05-13 DIAGNOSIS — I25119 Atherosclerotic heart disease of native coronary artery with unspecified angina pectoris: Secondary | ICD-10-CM | POA: Diagnosis not present

## 2017-05-13 DIAGNOSIS — J449 Chronic obstructive pulmonary disease, unspecified: Secondary | ICD-10-CM | POA: Diagnosis not present

## 2017-05-13 DIAGNOSIS — I5042 Chronic combined systolic (congestive) and diastolic (congestive) heart failure: Secondary | ICD-10-CM | POA: Diagnosis not present

## 2017-05-13 DIAGNOSIS — I350 Nonrheumatic aortic (valve) stenosis: Secondary | ICD-10-CM | POA: Diagnosis not present

## 2017-05-13 DIAGNOSIS — I272 Pulmonary hypertension, unspecified: Secondary | ICD-10-CM | POA: Diagnosis not present

## 2017-05-13 DIAGNOSIS — I480 Paroxysmal atrial fibrillation: Secondary | ICD-10-CM | POA: Diagnosis not present

## 2017-05-20 DIAGNOSIS — I350 Nonrheumatic aortic (valve) stenosis: Secondary | ICD-10-CM | POA: Diagnosis not present

## 2017-05-20 DIAGNOSIS — J449 Chronic obstructive pulmonary disease, unspecified: Secondary | ICD-10-CM | POA: Diagnosis not present

## 2017-05-20 DIAGNOSIS — I5042 Chronic combined systolic (congestive) and diastolic (congestive) heart failure: Secondary | ICD-10-CM | POA: Diagnosis not present

## 2017-05-20 DIAGNOSIS — I480 Paroxysmal atrial fibrillation: Secondary | ICD-10-CM | POA: Diagnosis not present

## 2017-05-20 DIAGNOSIS — I25119 Atherosclerotic heart disease of native coronary artery with unspecified angina pectoris: Secondary | ICD-10-CM | POA: Diagnosis not present

## 2017-05-20 DIAGNOSIS — I272 Pulmonary hypertension, unspecified: Secondary | ICD-10-CM | POA: Diagnosis not present

## 2017-05-21 DIAGNOSIS — I272 Pulmonary hypertension, unspecified: Secondary | ICD-10-CM | POA: Diagnosis not present

## 2017-05-21 DIAGNOSIS — Z952 Presence of prosthetic heart valve: Secondary | ICD-10-CM | POA: Diagnosis not present

## 2017-05-21 DIAGNOSIS — K922 Gastrointestinal hemorrhage, unspecified: Secondary | ICD-10-CM | POA: Diagnosis not present

## 2017-05-21 DIAGNOSIS — I25119 Atherosclerotic heart disease of native coronary artery with unspecified angina pectoris: Secondary | ICD-10-CM | POA: Diagnosis not present

## 2017-05-21 DIAGNOSIS — I350 Nonrheumatic aortic (valve) stenosis: Secondary | ICD-10-CM | POA: Diagnosis not present

## 2017-05-21 DIAGNOSIS — I480 Paroxysmal atrial fibrillation: Secondary | ICD-10-CM | POA: Diagnosis not present

## 2017-05-21 DIAGNOSIS — I5042 Chronic combined systolic (congestive) and diastolic (congestive) heart failure: Secondary | ICD-10-CM | POA: Diagnosis not present

## 2017-05-21 DIAGNOSIS — J449 Chronic obstructive pulmonary disease, unspecified: Secondary | ICD-10-CM | POA: Diagnosis not present

## 2017-05-21 DIAGNOSIS — Z87891 Personal history of nicotine dependence: Secondary | ICD-10-CM | POA: Diagnosis not present

## 2017-05-24 ENCOUNTER — Ambulatory Visit: Payer: Medicare Other | Admitting: Family Medicine

## 2017-05-27 DIAGNOSIS — I25119 Atherosclerotic heart disease of native coronary artery with unspecified angina pectoris: Secondary | ICD-10-CM | POA: Diagnosis not present

## 2017-05-27 DIAGNOSIS — I480 Paroxysmal atrial fibrillation: Secondary | ICD-10-CM | POA: Diagnosis not present

## 2017-05-27 DIAGNOSIS — I5042 Chronic combined systolic (congestive) and diastolic (congestive) heart failure: Secondary | ICD-10-CM | POA: Diagnosis not present

## 2017-05-27 DIAGNOSIS — J449 Chronic obstructive pulmonary disease, unspecified: Secondary | ICD-10-CM | POA: Diagnosis not present

## 2017-05-27 DIAGNOSIS — I272 Pulmonary hypertension, unspecified: Secondary | ICD-10-CM | POA: Diagnosis not present

## 2017-05-27 DIAGNOSIS — I350 Nonrheumatic aortic (valve) stenosis: Secondary | ICD-10-CM | POA: Diagnosis not present

## 2017-05-28 ENCOUNTER — Ambulatory Visit: Payer: Medicare Other | Admitting: Family Medicine

## 2017-05-28 ENCOUNTER — Telehealth: Payer: Self-pay | Admitting: Family Medicine

## 2017-05-28 NOTE — Telephone Encounter (Signed)
Please make sure he doesn't need anything and make sure hospice is aware his is struggling with copd

## 2017-05-28 NOTE — Telephone Encounter (Signed)
Call to check on patient and he states the he feels better.  He did a nebulizer treatment and morphine.  Advised him to let hospice know about this.

## 2017-05-28 NOTE — Telephone Encounter (Signed)
FYI

## 2017-05-28 NOTE — Telephone Encounter (Signed)
Copied from [place] [id number]. Topic: Quick Communication - See Telephone Encounter >> May 28, 2017  8:06 AM Robina Ade, Helene Kelp D wrote: CRM for notification. See Telephone encounter for: 05/28/17. Patient called and cancel his appt due to is COPD was bad and he reschedule it for next week. He also wanted to let Dr. Carollee Herter that he join hospice.

## 2017-06-01 ENCOUNTER — Ambulatory Visit: Payer: Medicare Other | Admitting: Family Medicine

## 2017-06-02 ENCOUNTER — Telehealth: Payer: Self-pay | Admitting: *Deleted

## 2017-06-02 NOTE — Telephone Encounter (Signed)
Received Physician Orders from [place]; forwarded to provider/SLS 03/13

## 2017-06-03 DIAGNOSIS — J449 Chronic obstructive pulmonary disease, unspecified: Secondary | ICD-10-CM | POA: Diagnosis not present

## 2017-06-03 DIAGNOSIS — I480 Paroxysmal atrial fibrillation: Secondary | ICD-10-CM | POA: Diagnosis not present

## 2017-06-03 DIAGNOSIS — I5042 Chronic combined systolic (congestive) and diastolic (congestive) heart failure: Secondary | ICD-10-CM | POA: Diagnosis not present

## 2017-06-03 DIAGNOSIS — I350 Nonrheumatic aortic (valve) stenosis: Secondary | ICD-10-CM | POA: Diagnosis not present

## 2017-06-03 DIAGNOSIS — I272 Pulmonary hypertension, unspecified: Secondary | ICD-10-CM | POA: Diagnosis not present

## 2017-06-03 DIAGNOSIS — I25119 Atherosclerotic heart disease of native coronary artery with unspecified angina pectoris: Secondary | ICD-10-CM | POA: Diagnosis not present

## 2017-06-09 DIAGNOSIS — I480 Paroxysmal atrial fibrillation: Secondary | ICD-10-CM | POA: Diagnosis not present

## 2017-06-09 DIAGNOSIS — J449 Chronic obstructive pulmonary disease, unspecified: Secondary | ICD-10-CM | POA: Diagnosis not present

## 2017-06-09 DIAGNOSIS — I5042 Chronic combined systolic (congestive) and diastolic (congestive) heart failure: Secondary | ICD-10-CM | POA: Diagnosis not present

## 2017-06-09 DIAGNOSIS — I25119 Atherosclerotic heart disease of native coronary artery with unspecified angina pectoris: Secondary | ICD-10-CM | POA: Diagnosis not present

## 2017-06-09 DIAGNOSIS — I350 Nonrheumatic aortic (valve) stenosis: Secondary | ICD-10-CM | POA: Diagnosis not present

## 2017-06-09 DIAGNOSIS — I272 Pulmonary hypertension, unspecified: Secondary | ICD-10-CM | POA: Diagnosis not present

## 2017-06-10 DIAGNOSIS — I480 Paroxysmal atrial fibrillation: Secondary | ICD-10-CM | POA: Diagnosis not present

## 2017-06-10 DIAGNOSIS — I25119 Atherosclerotic heart disease of native coronary artery with unspecified angina pectoris: Secondary | ICD-10-CM | POA: Diagnosis not present

## 2017-06-10 DIAGNOSIS — J449 Chronic obstructive pulmonary disease, unspecified: Secondary | ICD-10-CM | POA: Diagnosis not present

## 2017-06-10 DIAGNOSIS — I272 Pulmonary hypertension, unspecified: Secondary | ICD-10-CM | POA: Diagnosis not present

## 2017-06-10 DIAGNOSIS — I5042 Chronic combined systolic (congestive) and diastolic (congestive) heart failure: Secondary | ICD-10-CM | POA: Diagnosis not present

## 2017-06-10 DIAGNOSIS — I350 Nonrheumatic aortic (valve) stenosis: Secondary | ICD-10-CM | POA: Diagnosis not present

## 2017-06-17 DIAGNOSIS — J449 Chronic obstructive pulmonary disease, unspecified: Secondary | ICD-10-CM | POA: Diagnosis not present

## 2017-06-17 DIAGNOSIS — I5042 Chronic combined systolic (congestive) and diastolic (congestive) heart failure: Secondary | ICD-10-CM | POA: Diagnosis not present

## 2017-06-17 DIAGNOSIS — I272 Pulmonary hypertension, unspecified: Secondary | ICD-10-CM | POA: Diagnosis not present

## 2017-06-17 DIAGNOSIS — I25119 Atherosclerotic heart disease of native coronary artery with unspecified angina pectoris: Secondary | ICD-10-CM | POA: Diagnosis not present

## 2017-06-17 DIAGNOSIS — I480 Paroxysmal atrial fibrillation: Secondary | ICD-10-CM | POA: Diagnosis not present

## 2017-06-17 DIAGNOSIS — I350 Nonrheumatic aortic (valve) stenosis: Secondary | ICD-10-CM | POA: Diagnosis not present

## 2017-06-21 DIAGNOSIS — I350 Nonrheumatic aortic (valve) stenosis: Secondary | ICD-10-CM | POA: Diagnosis not present

## 2017-06-21 DIAGNOSIS — I480 Paroxysmal atrial fibrillation: Secondary | ICD-10-CM | POA: Diagnosis not present

## 2017-06-21 DIAGNOSIS — Z952 Presence of prosthetic heart valve: Secondary | ICD-10-CM | POA: Diagnosis not present

## 2017-06-21 DIAGNOSIS — Z9981 Dependence on supplemental oxygen: Secondary | ICD-10-CM | POA: Diagnosis not present

## 2017-06-21 DIAGNOSIS — I25119 Atherosclerotic heart disease of native coronary artery with unspecified angina pectoris: Secondary | ICD-10-CM | POA: Diagnosis not present

## 2017-06-21 DIAGNOSIS — J9612 Chronic respiratory failure with hypercapnia: Secondary | ICD-10-CM | POA: Diagnosis not present

## 2017-06-21 DIAGNOSIS — K922 Gastrointestinal hemorrhage, unspecified: Secondary | ICD-10-CM | POA: Diagnosis not present

## 2017-06-21 DIAGNOSIS — Z87891 Personal history of nicotine dependence: Secondary | ICD-10-CM | POA: Diagnosis not present

## 2017-06-21 DIAGNOSIS — J9611 Chronic respiratory failure with hypoxia: Secondary | ICD-10-CM | POA: Diagnosis not present

## 2017-06-21 DIAGNOSIS — I272 Pulmonary hypertension, unspecified: Secondary | ICD-10-CM | POA: Diagnosis not present

## 2017-06-21 DIAGNOSIS — J449 Chronic obstructive pulmonary disease, unspecified: Secondary | ICD-10-CM | POA: Diagnosis not present

## 2017-06-21 DIAGNOSIS — I5042 Chronic combined systolic (congestive) and diastolic (congestive) heart failure: Secondary | ICD-10-CM | POA: Diagnosis not present

## 2017-06-21 DIAGNOSIS — R0602 Shortness of breath: Secondary | ICD-10-CM | POA: Diagnosis not present

## 2017-06-22 ENCOUNTER — Telehealth: Payer: Self-pay | Admitting: Family Medicine

## 2017-06-22 DIAGNOSIS — I272 Pulmonary hypertension, unspecified: Secondary | ICD-10-CM | POA: Diagnosis not present

## 2017-06-22 DIAGNOSIS — I25119 Atherosclerotic heart disease of native coronary artery with unspecified angina pectoris: Secondary | ICD-10-CM | POA: Diagnosis not present

## 2017-06-22 DIAGNOSIS — I350 Nonrheumatic aortic (valve) stenosis: Secondary | ICD-10-CM | POA: Diagnosis not present

## 2017-06-22 DIAGNOSIS — I480 Paroxysmal atrial fibrillation: Secondary | ICD-10-CM | POA: Diagnosis not present

## 2017-06-22 DIAGNOSIS — I5042 Chronic combined systolic (congestive) and diastolic (congestive) heart failure: Secondary | ICD-10-CM | POA: Diagnosis not present

## 2017-06-22 DIAGNOSIS — R069 Unspecified abnormalities of breathing: Secondary | ICD-10-CM | POA: Diagnosis not present

## 2017-06-22 DIAGNOSIS — J449 Chronic obstructive pulmonary disease, unspecified: Secondary | ICD-10-CM | POA: Diagnosis not present

## 2017-06-22 NOTE — Telephone Encounter (Signed)
Copied from [place] [phone]. Topic: General - Other >> Jun 22, 2017 11:05 AM Margot Ables wrote: Reason for CRM: requesting VO to continue providing hospice care for pt.

## 2017-06-23 NOTE — Telephone Encounter (Signed)
Left message on machine with verbal orders. 

## 2017-06-24 DIAGNOSIS — I480 Paroxysmal atrial fibrillation: Secondary | ICD-10-CM | POA: Diagnosis not present

## 2017-06-24 DIAGNOSIS — I272 Pulmonary hypertension, unspecified: Secondary | ICD-10-CM | POA: Diagnosis not present

## 2017-06-24 DIAGNOSIS — I25119 Atherosclerotic heart disease of native coronary artery with unspecified angina pectoris: Secondary | ICD-10-CM | POA: Diagnosis not present

## 2017-06-24 DIAGNOSIS — I5042 Chronic combined systolic (congestive) and diastolic (congestive) heart failure: Secondary | ICD-10-CM | POA: Diagnosis not present

## 2017-06-24 DIAGNOSIS — I350 Nonrheumatic aortic (valve) stenosis: Secondary | ICD-10-CM | POA: Diagnosis not present

## 2017-06-24 DIAGNOSIS — J449 Chronic obstructive pulmonary disease, unspecified: Secondary | ICD-10-CM | POA: Diagnosis not present

## 2017-06-28 DIAGNOSIS — I350 Nonrheumatic aortic (valve) stenosis: Secondary | ICD-10-CM | POA: Diagnosis not present

## 2017-06-28 DIAGNOSIS — I25119 Atherosclerotic heart disease of native coronary artery with unspecified angina pectoris: Secondary | ICD-10-CM | POA: Diagnosis not present

## 2017-06-28 DIAGNOSIS — I5042 Chronic combined systolic (congestive) and diastolic (congestive) heart failure: Secondary | ICD-10-CM | POA: Diagnosis not present

## 2017-06-28 DIAGNOSIS — I480 Paroxysmal atrial fibrillation: Secondary | ICD-10-CM | POA: Diagnosis not present

## 2017-06-28 DIAGNOSIS — I272 Pulmonary hypertension, unspecified: Secondary | ICD-10-CM | POA: Diagnosis not present

## 2017-06-28 DIAGNOSIS — J449 Chronic obstructive pulmonary disease, unspecified: Secondary | ICD-10-CM | POA: Diagnosis not present

## 2017-07-01 DIAGNOSIS — I350 Nonrheumatic aortic (valve) stenosis: Secondary | ICD-10-CM | POA: Diagnosis not present

## 2017-07-01 DIAGNOSIS — J449 Chronic obstructive pulmonary disease, unspecified: Secondary | ICD-10-CM | POA: Diagnosis not present

## 2017-07-01 DIAGNOSIS — I25119 Atherosclerotic heart disease of native coronary artery with unspecified angina pectoris: Secondary | ICD-10-CM | POA: Diagnosis not present

## 2017-07-01 DIAGNOSIS — I480 Paroxysmal atrial fibrillation: Secondary | ICD-10-CM | POA: Diagnosis not present

## 2017-07-01 DIAGNOSIS — I5042 Chronic combined systolic (congestive) and diastolic (congestive) heart failure: Secondary | ICD-10-CM | POA: Diagnosis not present

## 2017-07-01 DIAGNOSIS — I272 Pulmonary hypertension, unspecified: Secondary | ICD-10-CM | POA: Diagnosis not present

## 2017-07-07 ENCOUNTER — Telehealth: Payer: Self-pay | Admitting: *Deleted

## 2017-07-07 DIAGNOSIS — I25119 Atherosclerotic heart disease of native coronary artery with unspecified angina pectoris: Secondary | ICD-10-CM | POA: Diagnosis not present

## 2017-07-07 DIAGNOSIS — J449 Chronic obstructive pulmonary disease, unspecified: Secondary | ICD-10-CM | POA: Diagnosis not present

## 2017-07-07 DIAGNOSIS — I272 Pulmonary hypertension, unspecified: Secondary | ICD-10-CM | POA: Diagnosis not present

## 2017-07-07 DIAGNOSIS — I480 Paroxysmal atrial fibrillation: Secondary | ICD-10-CM | POA: Diagnosis not present

## 2017-07-07 DIAGNOSIS — I350 Nonrheumatic aortic (valve) stenosis: Secondary | ICD-10-CM | POA: Diagnosis not present

## 2017-07-07 DIAGNOSIS — I5042 Chronic combined systolic (congestive) and diastolic (congestive) heart failure: Secondary | ICD-10-CM | POA: Diagnosis not present

## 2017-07-07 NOTE — Telephone Encounter (Signed)
Received Physician Orders from [facility], program of Hospice; forwarded to provider/SLS 04/17  

## 2017-07-08 DIAGNOSIS — I272 Pulmonary hypertension, unspecified: Secondary | ICD-10-CM | POA: Diagnosis not present

## 2017-07-08 DIAGNOSIS — I350 Nonrheumatic aortic (valve) stenosis: Secondary | ICD-10-CM | POA: Diagnosis not present

## 2017-07-08 DIAGNOSIS — I25119 Atherosclerotic heart disease of native coronary artery with unspecified angina pectoris: Secondary | ICD-10-CM | POA: Diagnosis not present

## 2017-07-08 DIAGNOSIS — I5042 Chronic combined systolic (congestive) and diastolic (congestive) heart failure: Secondary | ICD-10-CM | POA: Diagnosis not present

## 2017-07-08 DIAGNOSIS — I480 Paroxysmal atrial fibrillation: Secondary | ICD-10-CM | POA: Diagnosis not present

## 2017-07-08 DIAGNOSIS — J449 Chronic obstructive pulmonary disease, unspecified: Secondary | ICD-10-CM | POA: Diagnosis not present

## 2017-07-10 DIAGNOSIS — I480 Paroxysmal atrial fibrillation: Secondary | ICD-10-CM | POA: Diagnosis not present

## 2017-07-10 DIAGNOSIS — J449 Chronic obstructive pulmonary disease, unspecified: Secondary | ICD-10-CM | POA: Diagnosis not present

## 2017-07-10 DIAGNOSIS — I350 Nonrheumatic aortic (valve) stenosis: Secondary | ICD-10-CM | POA: Diagnosis not present

## 2017-07-10 DIAGNOSIS — I272 Pulmonary hypertension, unspecified: Secondary | ICD-10-CM | POA: Diagnosis not present

## 2017-07-10 DIAGNOSIS — I25119 Atherosclerotic heart disease of native coronary artery with unspecified angina pectoris: Secondary | ICD-10-CM | POA: Diagnosis not present

## 2017-07-10 DIAGNOSIS — I5042 Chronic combined systolic (congestive) and diastolic (congestive) heart failure: Secondary | ICD-10-CM | POA: Diagnosis not present

## 2017-07-13 NOTE — Progress Notes (Deleted)
Subjective:   Ronald Bell is a 77 y.o. male who presents for Medicare Annual/Subsequent preventive examination.  Review of Systems: No ROS.  Medicare Wellness Visit. Additional risk factors are reflected in the social history.    Sleep patterns: Takes Restoril for sleep. Sleeps 7-8 hrs Home Safety/Smoke Alarms: Feels safe in home. Smoke alarms in place.  Living environment; residence and Firearm Safety: Lives with wife in 1 story home.   Male:   CCS-  12/05/14   PSA-  Lab Results  Component Value Date   PSA 1.65 05/09/2015   PSA 1.47 05/10/2014   PSA 1.12 02/17/2013       Objective:    Vitals: There were no vitals taken for this visit.  There is no height or weight on file to calculate BMI.  Advanced Directives 02/02/2017 01/31/2017 09/05/2016 09/05/2016 12/19/2015 07/30/2015 07/30/2015  Does Patient Have a Medical Advance Directive? - No Yes Yes Yes No No  Type of Advance Directive - - Living will;Healthcare Power of [place];Living will - -  Does patient want to make changes to medical advance directive? - - No - Patient declined - - - -  Copy of [place] in Chart? - - - - No - copy requested - -  Would patient like information on creating a medical advance directive? No - Patient declined - - - - No - patient declined information No - patient declined information    Tobacco Social History   Tobacco Use  Smoking Status Former Smoker  . Packs/day: 1.00  . Years: 60.00  . Pack years: 60.00  . Types: Cigarettes, E-cigarettes  . Last attempt to quit: 04/18/2015  . Years since quitting: 2.2  Smokeless Tobacco Never Used     Counseling given: Not Answered   Clinical Intake:                       Past Medical History:  Diagnosis Date  . Angiodysplasia of intestine with hemorrhage    large and SB, gastric AVMs.   . Anxiety   . Arthritis    "left shoulder" (10/19/2014)  . Carotid artery occlusion     . Carotid artery stenosis 04/22/2012  . COPD GOLD III with min reversibilty  08/11/2006   Followed in Pulmonary clinic/ [facility]/ [place] - PFT's 04/28/2013  FEV1 0.88 (40%) with ratio 44 and 14% better p B2 dlco 45 corrects to 83 - Trial of breo 04/28/2013 > improved symptoms  06/09/2013  - spirometry 06/04/2014 FEV1  0.76 (29%) ratio 45      . Coronary artery disease   . GERD 11/30/2008   Qualifier: Diagnosis of  By: Marijean Niemann CMA, Danielle    . GERD (gastroesophageal reflux disease)   . GI bleed 2010   4 units PRBCs  . History of blood transfusion "couple times"   "related to bleeding in colon and esophagus"  . Hx of adenomatous colonic polyps 2012, 2013.   Marland Kitchen Hyperlipidemia   . Hypertension   . Iron deficiency anemia 01/25/2009   Qualifier: Diagnosis of  By: Henrene Pastor MD, Docia Chuck   . Irregular heartbeat   . On home oxygen therapy    "2L at night" (07/23/2015)  . Peripheral vascular disease ([place])   . Pleural plaque with presence of asbestos 03/27/2013   Followed in Pulmonary clinic/ [facility]/ [place] - F/u CT 09/08/2013 1. Stable extensive calcified pleural plaque formation consistent with asbestos exposure.  2. Multiple pulmonary nodules are unchanged from the CT of 6 months ago. Given risk factors for lung cancer, continued follow up is recommended with chest CT in 6 months> done 04/20/14 no change >repeat in 12 m in tickle file     . Polyp of nasal cavity   . PVD (peripheral vascular disease) ([place]) 10/18/2012  . Rosacea   . 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 Shingles   . Tobacco abuse    Past Surgical History:  Procedure Laterality Date  . CARDIAC CATHETERIZATION  2001; 06/28/2014  . CARDIAC CATHETERIZATION N/A 10/19/2014   Procedure: Coronary Stent Intervention;  Surgeon: Burnell Blanks, MD;  Location: [place] CV LAB;  Service: Cardiovascular;  Laterality: N/A;  BMS  Mid RCA  . COLONOSCOPY  July 2015   Dr. Henrene Pastor  . COLONOSCOPY N/A 08/17/2014   Procedure: COLONOSCOPY;  Surgeon: Jerene Bears, MD;  Location: [facility] ENDOSCOPY;  Service: Endoscopy;  Laterality: N/A;  . COLONOSCOPY N/A 12/05/2014   Procedure: COLONOSCOPY;  Surgeon: Manus Gunning, MD;  Location: [place];  Service: Gastroenterology;  Laterality: N/A;  . ENTEROSCOPY N/A 12/05/2014   Procedure: ENTEROSCOPY;  Surgeon: Manus Gunning, MD;  Location: [facility] ENDOSCOPY;  Service: Gastroenterology;  Laterality: N/A;  . ESOPHAGOGASTRODUODENOSCOPY  2012   normal  . ESOPHAGOGASTRODUODENOSCOPY N/A 08/17/2014   Procedure: ESOPHAGOGASTRODUODENOSCOPY (EGD);  Surgeon: Jerene Bears, MD;  Location: [facility] ENDOSCOPY;  Service: Endoscopy;  Laterality: N/A;  . ILIAC ARTERY STENT Left 2005   CIA  . KNEE ARTHROSCOPY WITH MEDIAL MENISECTOMY Left 03/08/2014   Procedure: LEFT KNEE SCOPE WITH MEDIAL MENISECTOMY AND CHONDROPLASTY;  Surgeon: Ninetta Lights, MD;  Location: [place];  Service: Orthopedics;  Laterality: Left;  . LEFT AND RIGHT HEART CATHETERIZATION WITH CORONARY ANGIOGRAM N/A 06/28/2014   Procedure: LEFT AND RIGHT HEART CATHETERIZATION WITH CORONARY ANGIOGRAM;  Surgeon: Jerline Pain, MD;  Location[facility] CATH LAB;  Service: Cardiovascular;  Laterality: N/A;  . RIB PLATING Left 11/20/2014   Procedure: RIB PLATING OF LEFT 8TH RIB;  Surgeon: [name], MD;  Location: [place];  Service: Open Heart Surgery;  Laterality: Left;  . RIGHT/LEFT HEART CATH AND CORONARY ANGIOGRAPHY N/A 02/05/2017   Procedure: RIGHT/LEFT HEART CATH AND CORONARY ANGIOGRAPHY;  Surgeon: Sherren Mocha, MD;  Location: [place] CV LAB;  Service: Cardiovascular;  Laterality: N/A;  . TEE WITHOUT CARDIOVERSION N/A 11/20/2014   Procedure: TRANSESOPHAGEAL ECHOCARDIOGRAM (TEE);  Surgeon: [name], MD;  Location: [place];  Service: Open Heart Surgery;  Laterality: N/A;  . TEE WITHOUT CARDIOVERSION N/A 12/19/2015   Procedure: TRANSESOPHAGEAL  ECHOCARDIOGRAM (TEE);  Surgeon: Jerline Pain, MD;  Location: [facility] ENDOSCOPY;  Service: Cardiovascular;  Laterality: N/A;  . TONSILLECTOMY    . TRANSCATHETER AORTIC VALVE REPLACEMENT, TRANSAPICAL N/A 11/20/2014   Procedure: TRANSCATHETER AORTIC VALVE REPLACEMENT, TRANSAPICAL;  Surgeon: [name], MD;  Location: [place];  Service: Open Heart Surgery;  Laterality: N/A;   Family History  Problem Relation Age of Onset  . Lung disease Mother        pulm fibrosis  . Colitis Father   . Heart disease Brother   . Hypertension Brother   . Hyperlipidemia Brother   . CAD Daughter        cad  . Hypertension Son   . Colon cancer Neg Hx    Social History   Socioeconomic History  .  Marital status: Married    Spouse name: Not on file  . Number of children: 4  . Years of education: Not on file  . Highest education level: Not on file  Occupational History  . Occupation: Oncologist: [employer]  . Financial resource strain: Not on file  . Food insecurity:    Worry: Not on file    Inability: Not on file  . Transportation needs:    Medical: Not on file    Non-medical: Not on file  Tobacco Use  . Smoking status: Former Smoker    Packs/day: 1.00    Years: 60.00    Pack years: 60.00    Types: Cigarettes, E-cigarettes    Last attempt to quit: 04/18/2015    Years since quitting: 2.2  . Smokeless tobacco: Never Used  Substance and Sexual Activity  . Alcohol use: Yes    Alcohol/week: 7.2 oz    Types: 12 Cans of beer per week  . Drug use: No  . Sexual activity: Never    Partners: Female  Lifestyle  . Physical activity:    Days per week: Not on file    Minutes per session: Not on file  . Stress: Not on file  Relationships  . Social connections:    Talks on phone: Not on file    Gets together: Not on file    Attends religious service: Not on file    Active member of club or organization: Not on file    Attends meetings of clubs or  organizations: Not on file    Relationship status: Not on file  Other Topics Concern  . Not on file  Social History Narrative  . Not on file    Outpatient Encounter Medications as of 07/15/2017  Medication Sig  . albuterol (PROVENTIL HFA;VENTOLIN HFA) 108 (90 Base) MCG/ACT inhaler Inhale 2 puffs into the lungs every 6 (six) hours as needed for wheezing or shortness of breath (Plan B).  . ALPRAZolam (XANAX) 0.5 MG tablet TAKE 1 TABLET BY MOUTH THREE TIMES DAILY  . aspirin 81 MG tablet Take 81 mg by mouth every morning.   Marland Kitchen atorvastatin (LIPITOR) 40 MG tablet Take 1 tablet (40 mg total) by mouth daily.  . benzonatate (TESSALON) 100 MG capsule Take 1 capsule (100 mg total) 2 (two) times daily by mouth.  . bisoprolol (ZEBETA) 5 MG tablet Take 0.5 tablets (2.5 mg total) by mouth daily.  . budesonide-formoterol (SYMBICORT) 160-4.5 MCG/ACT inhaler Inhale 2 puffs into the lungs 2 (two) times daily for 1 day.  . citalopram (CELEXA) 20 MG tablet Take 1 tablet (20 mg total) by mouth daily.  Marland Kitchen docusate sodium (COLACE) 100 MG capsule Take 100-200 mg by mouth See admin instructions. 1 capsule by mouth in the morning and 2 at bedtime   . famotidine (PEPCID) 20 MG tablet Take 20 mg by mouth at bedtime.   . fenofibrate 160 MG tablet Take 1 tablet (160 mg total) by mouth daily.  . Ferrous Sulfate (IRON) 325 (65 Fe) MG TABS TAKE ONE TABLET BY MOUTH TWICE DAILY  . folic acid (FOLVITE) 1 MG tablet Take 1 tablet (1 mg total) daily by mouth.  . furosemide (LASIX) 20 MG tablet Take 2 tablets (40 mg total) daily by mouth.  Marland Kitchen guaiFENesin (MUCINEX) 600 MG 12 hr tablet Take 1 tablet (600 mg total) by mouth 2 (two) times daily.  Marland Kitchen ipratropium-albuterol (DUONEB) 0.5-2.5 (3) MG/3ML SOLN Take 3 mLs by  nebulization every 4 (four) hours as needed (shortness of breath and wheezing plan C).  . morphine 10 MG/5ML solution Take 1.3 mLs (2.6 mg total) every 6 (six) hours as needed by mouth (shortness of breath).  . Multiple  Vitamins-Minerals (CENTRUM SILVER PO) Take 1 tablet by mouth daily.   . OXYGEN Use 2L at bedtime and as needed for shortness of breath  . pantoprazole (PROTONIX) 40 MG tablet Take 1 tablet (40 mg total) by mouth daily.  . sodium chloride (OCEAN) 0.65 % SOLN nasal spray Place 1-2 sprays into both nostrils as needed for congestion.   . sodium chloride (OCEAN) 0.65 % SOLN nasal spray Place 1 spray as needed into both nostrils for congestion.  . temazepam (RESTORIL) 30 MG capsule TAKE 1 CAPSULE BY MOUTH ONCE DAILY AT BEDTIME FOR SLEEP  . temazepam (RESTORIL) 30 MG capsule TAKE 1 CAPSULE BY MOUTH ONCE DAILY AT BEDTIME FOR SLEEP  . thiamine 100 MG tablet Take 1 tablet (100 mg total) daily by mouth.  . vitamin C (ASCORBIC ACID) 500 MG tablet Take 500 mg by mouth daily.   No facility-administered encounter medications on file as of 07/15/2017.     Activities of Daily Living In your present state of health, do you have any difficulty performing the following activities: 02/02/2017 09/05/2016  Hearing? N N  Vision? N N  Difficulty concentrating or making decisions? N N  Walking or climbing stairs? Y Y  Comment - -  Dressing or bathing? N N  Doing errands, shopping? Y N  Preparing Food and eating ? - -  Using the Toilet? - -  In the past six months, have you accidently leaked urine? - -  Do you have problems with loss of bowel control? - -  Managing your Medications? - -  Managing your Finances? - -  Housekeeping or managing your Housekeeping? - -  Some recent data might be hidden    Patient Care Team: Carollee Herter, Alferd Apa, DO as PCP - General [facility], Arvilla Meres, MD as PCP - Cardiology (Vascular Surgery) Jerline Pain, MD as Attending Physician (Cardiology) Irene Shipper, MD as Consulting Physician (Gastroenterology) [name], MD as Consulting Physician (Cardiothoracic Surgery)   Assessment:   This is a routine wellness examination for Ronald Bell. Physical assessment deferred to  PCP.   Exercise Activities and Dietary recommendations   Diet (meal preparation, eat out, water intake, caffeinated beverages, dairy products, fruits and vegetables): {Desc; diets:16563} Breakfast: Lunch:  Dinner:      Goals    . Quit smoking / using tobacco       Fall Risk Fall Risk  11/24/2016 07/14/2016 05/09/2015 03/05/2014 02/17/2013  Falls in the past year? No No No No No    Depression Screen PHQ 2/9 Scores 11/24/2016 07/14/2016 05/09/2015 03/05/2014  PHQ - 2 Score 1 2 1  0  PHQ- 9 Score - 3 - -    Cognitive Function        Immunization History  Administered Date(s) Administered  . Influenza Split 01/23/2011, 02/12/2012  . Influenza Whole 01/28/2007, 12/14/2007, 12/14/2008, 01/17/2010  . Influenza, High Dose Seasonal PF 12/17/2014, 12/06/2015, 11/24/2016  . Influenza,inj,Quad PF,6+ Mos 02/17/2013  . Influenza-Unspecified 01/09/2014  . Pneumococcal Conjugate-13 05/10/2014  . Pneumococcal Polysaccharide-23 12/18/2003, 11/30/2008, 11/24/2016  . Td 03/23/2002  . Tdap 03/03/2013  . Zoster 01/23/2011    Screening Tests Health Maintenance  Topic Date Due  . COLONOSCOPY  12/04/2016  . INFLUENZA VACCINE  10/21/2017  . TETANUS/TDAP  03/04/2023  . PNA vac Low Risk Adult  Completed    Plan:   ***  I have personally reviewed and noted the following in the patient's chart:   . Medical and social history . Use of alcohol, tobacco or illicit drugs  . Current medications and supplements . Functional ability and status . Nutritional status . Physical activity . Advanced directives . List of other physicians . Hospitalizations, surgeries, and ER visits in previous 12 months . Vitals . Screenings to include cognitive, depression, and falls . Referrals and appointments  In addition, I have reviewed and discussed with patient certain preventive protocols, quality metrics, and best practice recommendations. A written personalized care plan for preventive services as well  as general preventive health recommendations were provided to patient.     Naaman Plummer [place], [place]  07/13/2017

## 2017-07-14 ENCOUNTER — Telehealth: Payer: Self-pay | Admitting: *Deleted

## 2017-07-14 NOTE — Telephone Encounter (Signed)
Received Physician Orders/Recertification from [place]; forwarded to provider/SLS 04/24

## 2017-07-15 ENCOUNTER — Ambulatory Visit: Payer: Medicare Other | Admitting: *Deleted

## 2017-07-15 DIAGNOSIS — I25119 Atherosclerotic heart disease of native coronary artery with unspecified angina pectoris: Secondary | ICD-10-CM | POA: Diagnosis not present

## 2017-07-15 DIAGNOSIS — I272 Pulmonary hypertension, unspecified: Secondary | ICD-10-CM | POA: Diagnosis not present

## 2017-07-15 DIAGNOSIS — I480 Paroxysmal atrial fibrillation: Secondary | ICD-10-CM | POA: Diagnosis not present

## 2017-07-15 DIAGNOSIS — I5042 Chronic combined systolic (congestive) and diastolic (congestive) heart failure: Secondary | ICD-10-CM | POA: Diagnosis not present

## 2017-07-15 DIAGNOSIS — I350 Nonrheumatic aortic (valve) stenosis: Secondary | ICD-10-CM | POA: Diagnosis not present

## 2017-07-15 DIAGNOSIS — J449 Chronic obstructive pulmonary disease, unspecified: Secondary | ICD-10-CM | POA: Diagnosis not present

## 2017-07-21 DIAGNOSIS — I480 Paroxysmal atrial fibrillation: Secondary | ICD-10-CM | POA: Diagnosis not present

## 2017-07-21 DIAGNOSIS — I5042 Chronic combined systolic (congestive) and diastolic (congestive) heart failure: Secondary | ICD-10-CM | POA: Diagnosis not present

## 2017-07-21 DIAGNOSIS — J9611 Chronic respiratory failure with hypoxia: Secondary | ICD-10-CM | POA: Diagnosis not present

## 2017-07-21 DIAGNOSIS — J9612 Chronic respiratory failure with hypercapnia: Secondary | ICD-10-CM | POA: Diagnosis not present

## 2017-07-21 DIAGNOSIS — Z952 Presence of prosthetic heart valve: Secondary | ICD-10-CM | POA: Diagnosis not present

## 2017-07-21 DIAGNOSIS — J449 Chronic obstructive pulmonary disease, unspecified: Secondary | ICD-10-CM | POA: Diagnosis not present

## 2017-07-21 DIAGNOSIS — I272 Pulmonary hypertension, unspecified: Secondary | ICD-10-CM | POA: Diagnosis not present

## 2017-07-21 DIAGNOSIS — I25119 Atherosclerotic heart disease of native coronary artery with unspecified angina pectoris: Secondary | ICD-10-CM | POA: Diagnosis not present

## 2017-07-21 DIAGNOSIS — Z87891 Personal history of nicotine dependence: Secondary | ICD-10-CM | POA: Diagnosis not present

## 2017-07-21 DIAGNOSIS — Z9981 Dependence on supplemental oxygen: Secondary | ICD-10-CM | POA: Diagnosis not present

## 2017-07-21 DIAGNOSIS — K922 Gastrointestinal hemorrhage, unspecified: Secondary | ICD-10-CM | POA: Diagnosis not present

## 2017-07-21 DIAGNOSIS — I350 Nonrheumatic aortic (valve) stenosis: Secondary | ICD-10-CM | POA: Diagnosis not present

## 2017-07-22 DIAGNOSIS — I5042 Chronic combined systolic (congestive) and diastolic (congestive) heart failure: Secondary | ICD-10-CM | POA: Diagnosis not present

## 2017-07-22 DIAGNOSIS — I25119 Atherosclerotic heart disease of native coronary artery with unspecified angina pectoris: Secondary | ICD-10-CM | POA: Diagnosis not present

## 2017-07-22 DIAGNOSIS — I350 Nonrheumatic aortic (valve) stenosis: Secondary | ICD-10-CM | POA: Diagnosis not present

## 2017-07-22 DIAGNOSIS — I272 Pulmonary hypertension, unspecified: Secondary | ICD-10-CM | POA: Diagnosis not present

## 2017-07-22 DIAGNOSIS — J449 Chronic obstructive pulmonary disease, unspecified: Secondary | ICD-10-CM | POA: Diagnosis not present

## 2017-07-22 DIAGNOSIS — I480 Paroxysmal atrial fibrillation: Secondary | ICD-10-CM | POA: Diagnosis not present

## 2017-07-23 DIAGNOSIS — I5042 Chronic combined systolic (congestive) and diastolic (congestive) heart failure: Secondary | ICD-10-CM | POA: Diagnosis not present

## 2017-07-23 DIAGNOSIS — I272 Pulmonary hypertension, unspecified: Secondary | ICD-10-CM | POA: Diagnosis not present

## 2017-07-23 DIAGNOSIS — I350 Nonrheumatic aortic (valve) stenosis: Secondary | ICD-10-CM | POA: Diagnosis not present

## 2017-07-23 DIAGNOSIS — I25119 Atherosclerotic heart disease of native coronary artery with unspecified angina pectoris: Secondary | ICD-10-CM | POA: Diagnosis not present

## 2017-07-23 DIAGNOSIS — J449 Chronic obstructive pulmonary disease, unspecified: Secondary | ICD-10-CM | POA: Diagnosis not present

## 2017-07-23 DIAGNOSIS — I480 Paroxysmal atrial fibrillation: Secondary | ICD-10-CM | POA: Diagnosis not present

## 2017-07-26 ENCOUNTER — Telehealth: Payer: Self-pay | Admitting: *Deleted

## 2017-07-26 NOTE — Telephone Encounter (Signed)
Received chart/clinical notes for review; forwarded to provider/SLS 05/06

## 2017-07-29 DIAGNOSIS — I25119 Atherosclerotic heart disease of native coronary artery with unspecified angina pectoris: Secondary | ICD-10-CM | POA: Diagnosis not present

## 2017-07-29 DIAGNOSIS — I480 Paroxysmal atrial fibrillation: Secondary | ICD-10-CM | POA: Diagnosis not present

## 2017-07-29 DIAGNOSIS — I272 Pulmonary hypertension, unspecified: Secondary | ICD-10-CM | POA: Diagnosis not present

## 2017-07-29 DIAGNOSIS — I350 Nonrheumatic aortic (valve) stenosis: Secondary | ICD-10-CM | POA: Diagnosis not present

## 2017-07-29 DIAGNOSIS — I5042 Chronic combined systolic (congestive) and diastolic (congestive) heart failure: Secondary | ICD-10-CM | POA: Diagnosis not present

## 2017-07-29 DIAGNOSIS — J449 Chronic obstructive pulmonary disease, unspecified: Secondary | ICD-10-CM | POA: Diagnosis not present

## 2017-08-03 ENCOUNTER — Telehealth: Payer: Self-pay | Admitting: *Deleted

## 2017-08-03 NOTE — Telephone Encounter (Signed)
Received Physician Orders/Plan of Care from [place]; forwarded to provider/SLS 05/14

## 2017-08-05 DIAGNOSIS — I25119 Atherosclerotic heart disease of native coronary artery with unspecified angina pectoris: Secondary | ICD-10-CM | POA: Diagnosis not present

## 2017-08-05 DIAGNOSIS — I5042 Chronic combined systolic (congestive) and diastolic (congestive) heart failure: Secondary | ICD-10-CM | POA: Diagnosis not present

## 2017-08-05 DIAGNOSIS — I350 Nonrheumatic aortic (valve) stenosis: Secondary | ICD-10-CM | POA: Diagnosis not present

## 2017-08-05 DIAGNOSIS — I480 Paroxysmal atrial fibrillation: Secondary | ICD-10-CM | POA: Diagnosis not present

## 2017-08-05 DIAGNOSIS — J449 Chronic obstructive pulmonary disease, unspecified: Secondary | ICD-10-CM | POA: Diagnosis not present

## 2017-08-05 DIAGNOSIS — I272 Pulmonary hypertension, unspecified: Secondary | ICD-10-CM | POA: Diagnosis not present

## 2017-08-06 DIAGNOSIS — I350 Nonrheumatic aortic (valve) stenosis: Secondary | ICD-10-CM | POA: Diagnosis not present

## 2017-08-06 DIAGNOSIS — I272 Pulmonary hypertension, unspecified: Secondary | ICD-10-CM | POA: Diagnosis not present

## 2017-08-06 DIAGNOSIS — I5042 Chronic combined systolic (congestive) and diastolic (congestive) heart failure: Secondary | ICD-10-CM | POA: Diagnosis not present

## 2017-08-06 DIAGNOSIS — I480 Paroxysmal atrial fibrillation: Secondary | ICD-10-CM | POA: Diagnosis not present

## 2017-08-06 DIAGNOSIS — I25119 Atherosclerotic heart disease of native coronary artery with unspecified angina pectoris: Secondary | ICD-10-CM | POA: Diagnosis not present

## 2017-08-06 DIAGNOSIS — J449 Chronic obstructive pulmonary disease, unspecified: Secondary | ICD-10-CM | POA: Diagnosis not present

## 2017-08-12 DIAGNOSIS — J449 Chronic obstructive pulmonary disease, unspecified: Secondary | ICD-10-CM | POA: Diagnosis not present

## 2017-08-12 DIAGNOSIS — I5042 Chronic combined systolic (congestive) and diastolic (congestive) heart failure: Secondary | ICD-10-CM | POA: Diagnosis not present

## 2017-08-12 DIAGNOSIS — I350 Nonrheumatic aortic (valve) stenosis: Secondary | ICD-10-CM | POA: Diagnosis not present

## 2017-08-12 DIAGNOSIS — I25119 Atherosclerotic heart disease of native coronary artery with unspecified angina pectoris: Secondary | ICD-10-CM | POA: Diagnosis not present

## 2017-08-12 DIAGNOSIS — I480 Paroxysmal atrial fibrillation: Secondary | ICD-10-CM | POA: Diagnosis not present

## 2017-08-12 DIAGNOSIS — I272 Pulmonary hypertension, unspecified: Secondary | ICD-10-CM | POA: Diagnosis not present

## 2017-08-19 ENCOUNTER — Telehealth: Payer: Self-pay | Admitting: *Deleted

## 2017-08-19 DIAGNOSIS — I5042 Chronic combined systolic (congestive) and diastolic (congestive) heart failure: Secondary | ICD-10-CM | POA: Diagnosis not present

## 2017-08-19 DIAGNOSIS — J449 Chronic obstructive pulmonary disease, unspecified: Secondary | ICD-10-CM | POA: Diagnosis not present

## 2017-08-19 DIAGNOSIS — I480 Paroxysmal atrial fibrillation: Secondary | ICD-10-CM | POA: Diagnosis not present

## 2017-08-19 DIAGNOSIS — I272 Pulmonary hypertension, unspecified: Secondary | ICD-10-CM | POA: Diagnosis not present

## 2017-08-19 DIAGNOSIS — I25119 Atherosclerotic heart disease of native coronary artery with unspecified angina pectoris: Secondary | ICD-10-CM | POA: Diagnosis not present

## 2017-08-19 DIAGNOSIS — I350 Nonrheumatic aortic (valve) stenosis: Secondary | ICD-10-CM | POA: Diagnosis not present

## 2017-08-19 NOTE — Telephone Encounter (Signed)
Received Physician Orders from [place]; forwarded to provider/SLS 05/30

## 2017-08-27 ENCOUNTER — Telehealth: Payer: Self-pay | Admitting: *Deleted

## 2017-08-27 NOTE — Telephone Encounter (Signed)
Received Physician Orders from [facility]; forwarded to provider/SLS 06/07 

## 2017-08-30 ENCOUNTER — Encounter (HOSPITAL_COMMUNITY): Payer: Self-pay

## 2017-08-30 ENCOUNTER — Emergency Department (HOSPITAL_COMMUNITY)

## 2017-08-30 ENCOUNTER — Other Ambulatory Visit: Payer: Self-pay

## 2017-08-30 ENCOUNTER — Inpatient Hospital Stay (HOSPITAL_COMMUNITY)
Admission: EM | Admit: 2017-08-30 | Discharge: 2017-09-01 | DRG: 189 | Disposition: A | Discharge status: Hospice | Source: Hospice | Attending: Internal Medicine | Admitting: Internal Medicine

## 2017-08-30 DIAGNOSIS — J9612 Chronic respiratory failure with hypercapnia: Secondary | ICD-10-CM

## 2017-08-30 DIAGNOSIS — E161 Other hypoglycemia: Secondary | ICD-10-CM | POA: Diagnosis not present

## 2017-08-30 DIAGNOSIS — I10 Essential (primary) hypertension: Secondary | ICD-10-CM | POA: Diagnosis not present

## 2017-08-30 DIAGNOSIS — I5022 Chronic systolic (congestive) heart failure: Secondary | ICD-10-CM | POA: Diagnosis not present

## 2017-08-30 DIAGNOSIS — Z955 Presence of coronary angioplasty implant and graft: Secondary | ICD-10-CM

## 2017-08-30 DIAGNOSIS — Z66 Do not resuscitate: Secondary | ICD-10-CM | POA: Diagnosis present

## 2017-08-30 DIAGNOSIS — D509 Iron deficiency anemia, unspecified: Secondary | ICD-10-CM | POA: Diagnosis not present

## 2017-08-30 DIAGNOSIS — J9690 Respiratory failure, unspecified, unspecified whether with hypoxia or hypercapnia: Secondary | ICD-10-CM | POA: Diagnosis present

## 2017-08-30 DIAGNOSIS — E1165 Type 2 diabetes mellitus with hyperglycemia: Secondary | ICD-10-CM | POA: Diagnosis not present

## 2017-08-30 DIAGNOSIS — I480 Paroxysmal atrial fibrillation: Secondary | ICD-10-CM | POA: Diagnosis not present

## 2017-08-30 DIAGNOSIS — N179 Acute kidney failure, unspecified: Secondary | ICD-10-CM | POA: Diagnosis not present

## 2017-08-30 DIAGNOSIS — Z952 Presence of prosthetic heart valve: Secondary | ICD-10-CM

## 2017-08-30 DIAGNOSIS — E872 Acidosis: Secondary | ICD-10-CM | POA: Diagnosis not present

## 2017-08-30 DIAGNOSIS — G9341 Metabolic encephalopathy: Secondary | ICD-10-CM | POA: Diagnosis present

## 2017-08-30 DIAGNOSIS — I2510 Atherosclerotic heart disease of native coronary artery without angina pectoris: Secondary | ICD-10-CM | POA: Diagnosis present

## 2017-08-30 DIAGNOSIS — Z9981 Dependence on supplemental oxygen: Secondary | ICD-10-CM

## 2017-08-30 DIAGNOSIS — R0689 Other abnormalities of breathing: Secondary | ICD-10-CM | POA: Diagnosis not present

## 2017-08-30 DIAGNOSIS — Z953 Presence of xenogenic heart valve: Secondary | ICD-10-CM

## 2017-08-30 DIAGNOSIS — E785 Hyperlipidemia, unspecified: Secondary | ICD-10-CM | POA: Diagnosis present

## 2017-08-30 DIAGNOSIS — K219 Gastro-esophageal reflux disease without esophagitis: Secondary | ICD-10-CM | POA: Diagnosis present

## 2017-08-30 DIAGNOSIS — Z7951 Long term (current) use of inhaled steroids: Secondary | ICD-10-CM | POA: Diagnosis not present

## 2017-08-30 DIAGNOSIS — J9611 Chronic respiratory failure with hypoxia: Secondary | ICD-10-CM | POA: Diagnosis present

## 2017-08-30 DIAGNOSIS — I110 Hypertensive heart disease with heart failure: Secondary | ICD-10-CM | POA: Diagnosis present

## 2017-08-30 DIAGNOSIS — Z8249 Family history of ischemic heart disease and other diseases of the circulatory system: Secondary | ICD-10-CM | POA: Diagnosis not present

## 2017-08-30 DIAGNOSIS — Z7982 Long term (current) use of aspirin: Secondary | ICD-10-CM

## 2017-08-30 DIAGNOSIS — J9622 Acute and chronic respiratory failure with hypercapnia: Secondary | ICD-10-CM | POA: Diagnosis present

## 2017-08-30 DIAGNOSIS — R748 Abnormal levels of other serum enzymes: Secondary | ICD-10-CM | POA: Diagnosis not present

## 2017-08-30 DIAGNOSIS — Z87891 Personal history of nicotine dependence: Secondary | ICD-10-CM | POA: Diagnosis not present

## 2017-08-30 DIAGNOSIS — I214 Non-ST elevation (NSTEMI) myocardial infarction: Secondary | ICD-10-CM | POA: Diagnosis present

## 2017-08-30 DIAGNOSIS — Z8349 Family history of other endocrine, nutritional and metabolic diseases: Secondary | ICD-10-CM

## 2017-08-30 DIAGNOSIS — E870 Hyperosmolality and hypernatremia: Secondary | ICD-10-CM | POA: Diagnosis not present

## 2017-08-30 DIAGNOSIS — F419 Anxiety disorder, unspecified: Secondary | ICD-10-CM | POA: Diagnosis present

## 2017-08-30 DIAGNOSIS — Z515 Encounter for palliative care: Secondary | ICD-10-CM | POA: Diagnosis not present

## 2017-08-30 DIAGNOSIS — R0600 Dyspnea, unspecified: Secondary | ICD-10-CM | POA: Diagnosis not present

## 2017-08-30 DIAGNOSIS — J441 Chronic obstructive pulmonary disease with (acute) exacerbation: Secondary | ICD-10-CM | POA: Diagnosis not present

## 2017-08-30 DIAGNOSIS — J9621 Acute and chronic respiratory failure with hypoxia: Secondary | ICD-10-CM | POA: Diagnosis present

## 2017-08-30 DIAGNOSIS — J9620 Acute and chronic respiratory failure, unspecified whether with hypoxia or hypercapnia: Secondary | ICD-10-CM | POA: Diagnosis present

## 2017-08-30 DIAGNOSIS — I739 Peripheral vascular disease, unspecified: Secondary | ICD-10-CM | POA: Diagnosis present

## 2017-08-30 DIAGNOSIS — R000 Tachycardia, unspecified: Secondary | ICD-10-CM | POA: Diagnosis not present

## 2017-08-30 DIAGNOSIS — R402441 Other coma, without documented Glasgow coma scale score, or with partial score reported, in the field [EMT or ambulance]: Secondary | ICD-10-CM | POA: Diagnosis not present

## 2017-08-30 LAB — COMPREHENSIVE METABOLIC PANEL
ALBUMIN: 3.5 g/dL (ref 3.5–5.0)
ALK PHOS: 45 U/L (ref 38–126)
ALT: 153 U/L — ABNORMAL HIGH (ref 17–63)
ANION GAP: 15 (ref 5–15)
AST: 169 U/L — AB (ref 15–41)
BILIRUBIN TOTAL: 0.9 mg/dL (ref 0.3–1.2)
BUN: 25 mg/dL — AB (ref 6–20)
CALCIUM: 8.8 mg/dL — AB (ref 8.9–10.3)
CO2: 29 mmol/L (ref 22–32)
Chloride: 101 mmol/L (ref 101–111)
Creatinine, Ser: 1.18 mg/dL (ref 0.61–1.24)
GFR calc Af Amer: >60 mL/min (ref >60)
GFR, EST NON AFRICAN AMERICAN: 57 mL/min — AB (ref >60)
GLUCOSE: 167 mg/dL — AB (ref 65–99)
Potassium: 3.5 mmol/L (ref 3.5–5.1)
Sodium: 145 mmol/L (ref 135–145)
Total Protein: 6.6 g/dL (ref 6.5–8.1)

## 2017-08-30 LAB — I-STAT CG4 LACTIC ACID, ED: Lactic Acid, Venous: 3 mmol/L (ref 0.5–1.9)

## 2017-08-30 LAB — CBG MONITORING, ED: GLUCOSE-CAPILLARY: 169 mg/dL — AB (ref 65–99)

## 2017-08-30 LAB — I-STAT CHEM 8, ED
BUN: 27 mg/dL — AB (ref 6–20)
CALCIUM ION: 1.1 mmol/L — AB (ref 1.15–1.40)
CREATININE: 1.2 mg/dL (ref 0.61–1.24)
Chloride: 100 mmol/L — ABNORMAL LOW (ref 101–111)
Glucose, Bld: 166 mg/dL — ABNORMAL HIGH (ref 65–99)
HCT: 40 % (ref 39.0–52.0)
Hemoglobin: 13.6 g/dL (ref 13.0–17.0)
Potassium: 3.4 mmol/L — ABNORMAL LOW (ref 3.5–5.1)
Sodium: 142 mmol/L (ref 135–145)
TCO2: 31 mmol/L (ref 22–32)

## 2017-08-30 LAB — CBC WITH DIFFERENTIAL/PLATELET
Abs Immature Granulocytes: 0.9 10*3/uL — ABNORMAL HIGH (ref 0.0–0.1)
Basophils Absolute: 0.1 10*3/uL (ref 0.0–0.1)
Basophils Relative: 0 %
EOS PCT: 0 %
Eosinophils Absolute: 0 10*3/uL (ref 0.0–0.7)
HEMATOCRIT: 41.7 % (ref 39.0–52.0)
Hemoglobin: 13.4 g/dL (ref 13.0–17.0)
Immature Granulocytes: 4 %
LYMPHS ABS: 0.6 10*3/uL — AB (ref 0.7–4.0)
Lymphocytes Relative: 3 %
MCH: 30.7 pg (ref 26.0–34.0)
MCHC: 32.1 g/dL (ref 30.0–36.0)
MCV: 95.4 fL (ref 78.0–100.0)
MONOS PCT: 4 %
Monocytes Absolute: 0.9 10*3/uL (ref 0.1–1.0)
Neutro Abs: 17.2 10*3/uL — ABNORMAL HIGH (ref 1.7–7.7)
Neutrophils Relative %: 89 %
Platelets: 150 10*3/uL (ref 150–400)
RBC: 4.37 MIL/uL (ref 4.22–5.81)
RDW: 14.4 % (ref 11.5–15.5)
WBC: 19.6 10*3/uL — ABNORMAL HIGH (ref 4.0–10.5)

## 2017-08-30 LAB — I-STAT TROPONIN, ED: Troponin i, poc: 3.02 ng/mL (ref 0.00–0.08)

## 2017-08-30 LAB — TROPONIN I: Troponin I: 2.51 ng/mL (ref <0.03)

## 2017-08-30 LAB — I-STAT ARTERIAL BLOOD GAS, ED
ACID-BASE EXCESS: 5 mmol/L — AB (ref 0.0–2.0)
BICARBONATE: 35.5 mmol/L — AB (ref 20.0–28.0)
O2 SAT: 100 %
Patient temperature: 98.4
TCO2: 38 mmol/L — ABNORMAL HIGH (ref 22–32)
pCO2 arterial: 82.3 mmHg (ref 32.0–48.0)
pH, Arterial: 7.243 — ABNORMAL LOW (ref 7.350–7.450)
pO2, Arterial: 400 mmHg — ABNORMAL HIGH (ref 83.0–108.0)

## 2017-08-30 LAB — BRAIN NATRIURETIC PEPTIDE: B Natriuretic Peptide: 246.7 pg/mL — ABNORMAL HIGH (ref 0.0–100.0)

## 2017-08-30 MED ORDER — ALBUTEROL SULFATE (2.5 MG/3ML) 0.083% IN NEBU
5.0000 mg | INHALATION_SOLUTION | Freq: Once | RESPIRATORY_TRACT | Status: AC
  Administered 2017-08-30: 5 mg via RESPIRATORY_TRACT
  Filled 2017-08-30: qty 6

## 2017-08-30 MED ORDER — HYDROMORPHONE HCL 2 MG/ML IJ SOLN
0.5000 mg | INTRAMUSCULAR | Status: DC | PRN
  Administered 2017-08-30: 0.5 mg via INTRAVENOUS

## 2017-08-30 MED ORDER — ONDANSETRON HCL 4 MG/2ML IJ SOLN: 4.0000 mg | Freq: Four times a day (QID) | INTRAMUSCULAR | Status: DC | PRN

## 2017-08-30 MED ORDER — LORAZEPAM 2 MG/ML IJ SOLN: 1.0000 mg | Freq: Four times a day (QID) | INTRAMUSCULAR | Status: DC | PRN

## 2017-08-30 MED ORDER — ONDANSETRON HCL 4 MG PO TABS: 4.0000 mg | ORAL_TABLET | Freq: Four times a day (QID) | ORAL | Status: DC | PRN

## 2017-08-30 MED ORDER — LORAZEPAM 2 MG/ML IJ SOLN
1.0000 mg | INTRAMUSCULAR | Status: DC | PRN
  Administered 2017-08-30 – 2017-08-31 (×4): 1 mg via INTRAVENOUS
  Filled 2017-08-30 (×4): qty 1

## 2017-08-30 MED ORDER — METHYLPREDNISOLONE SODIUM SUCC 125 MG IJ SOLR
60.0000 mg | Freq: Once | INTRAMUSCULAR | Status: AC
  Administered 2017-08-30: 60 mg via INTRAVENOUS
  Filled 2017-08-30: qty 2

## 2017-08-30 MED ORDER — LORAZEPAM 2 MG/ML IJ SOLN: 1.0000 mg | Freq: Four times a day (QID) | INTRAMUSCULAR | Status: DC

## 2017-08-30 MED ORDER — LORAZEPAM 2 MG/ML IJ SOLN
1.0000 mg | INTRAMUSCULAR | Status: AC
  Administered 2017-08-30: 1 mg via INTRAVENOUS
  Filled 2017-08-30: qty 1

## 2017-08-30 MED ORDER — HYDROMORPHONE HCL 2 MG/ML IJ SOLN
0.5000 mg | INTRAMUSCULAR | Status: DC | PRN
  Filled 2017-08-30: qty 1

## 2017-08-30 NOTE — H&P (Signed)
History and Physical    Ronald Bell YQM:[id number] DOB: 1940-09-22 DOA: 08/30/2017  PCP: Ann Held, DO Patient coming from: Home   I have personally briefly reviewed patient's old medical records in [place]  Chief Complaint:   HPI: Ronald Bell is a 77 y.o. male with medical history significant of COPD, paroxysmal A fib, systolic Heart failure, aortic val stenosis, fail TAVR , patient was on hospice at home. Patient was found unresponsive at home by wife, she call EMS. Patient 's wife didn't inform EMS that patient was DNR, EMS inserted nasal trumpet to deliver oxygen, patient was give amp D 10. Patient was placed on BIPAP in the ED. Blood gas showed hypercapnia. I reviewed records from ED, it seems wife wanted to take patient home, but she wouldn't be able to take care of him at home, and provide comfort care to the patient. Patient ' wife was escorted out of ED because she became  agitated and verbally abusive.   I tried to contact patient 's wife, but she doesn't answer the phone.  I was able to speak with patient's son over phone. He agrees and understand that his father has been in hospice care. He and his sister agree for patient to be admitted to hospital, continue with BIPAP for now, until they are able to come to hospital.  They understand and agree with no further escalation of care.    ED Course: presents with respiratory distress, placed on BIPAP. ABG 7.2, PCO2 82, po2 400, k 3.4, BUN 27, WBC 19, lactic acid 3.0. Chest x ray no acute diseases.   Review of Systems: unable to obtain due to AMS.    Past Medical History:  Diagnosis Date  . Angiodysplasia of intestine with hemorrhage    large and SB, gastric AVMs.   . Anxiety   . Arthritis    "left shoulder" (10/19/2014)  . Carotid artery occlusion   . Carotid artery stenosis 04/22/2012  . COPD GOLD III with min reversibilty  08/11/2006   Followed in Pulmonary clinic/ [facility]/ [place] -  PFT's 04/28/2013  FEV1 0.88 (40%) with ratio 44 and 14% better p B2 dlco 45 corrects to 83 - Trial of breo 04/28/2013 > improved symptoms  06/09/2013  - spirometry 06/04/2014 FEV1  0.76 (29%) ratio 45      . Coronary artery disease   . GERD 11/30/2008   Qualifier: Diagnosis of  By: Marijean Niemann CMA, Danielle    . GERD (gastroesophageal reflux disease)   . GI bleed 2010   4 units PRBCs  . History of blood transfusion "couple times"   "related to bleeding in colon and esophagus"  . Hx of adenomatous colonic polyps 2012, 2013.   Marland Kitchen Hyperlipidemia   . Hypertension   . Iron deficiency anemia 01/25/2009   Qualifier: Diagnosis of  By: Henrene Pastor MD, Docia Chuck   . Irregular heartbeat   . On home oxygen therapy    "2L at night" (07/23/2015)  . Peripheral vascular disease ([place])   . Pleural plaque with presence of asbestos 03/27/2013   Followed in Pulmonary clinic/ [facility]/ [place] - F/u CT 09/08/2013 1. Stable extensive calcified pleural plaque formation consistent with asbestos exposure. 2. Multiple pulmonary nodules are unchanged from the CT of 6 months ago. Given risk factors for lung cancer, continued follow up is recommended with chest CT in 6 months> done 04/20/14 no change >repeat in 12 m in tickle file     .  Polyp of nasal cavity   . PVD (peripheral vascular disease) ([place]) 10/18/2012  . Rosacea   . 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 Shingles   . Tobacco abuse     Past Surgical History:  Procedure Laterality Date  . CARDIAC CATHETERIZATION  2001; 06/28/2014  . CARDIAC CATHETERIZATION N/A 10/19/2014   Procedure: Coronary Stent Intervention;  Surgeon: Burnell Blanks, MD;  Location: [place] CV LAB;  Service: Cardiovascular;  Laterality: N/A;  BMS Mid RCA  . COLONOSCOPY  July 2015   Dr. Henrene Pastor  . COLONOSCOPY N/A 08/17/2014   Procedure: COLONOSCOPY;  Surgeon: Jerene Bears, MD;  Location[facility]  ENDOSCOPY;  Service: Endoscopy;  Laterality: N/A;  . COLONOSCOPY N/A 12/05/2014   Procedure: COLONOSCOPY;  Surgeon: Manus Gunning, MD;  Location: [place];  Service: Gastroenterology;  Laterality: N/A;  . ENTEROSCOPY N/A 12/05/2014   Procedure: ENTEROSCOPY;  Surgeon: Manus Gunning, MD;  Location: [facility] ENDOSCOPY;  Service: Gastroenterology;  Laterality: N/A;  . ESOPHAGOGASTRODUODENOSCOPY  2012   normal  . ESOPHAGOGASTRODUODENOSCOPY N/A 08/17/2014   Procedure: ESOPHAGOGASTRODUODENOSCOPY (EGD);  Surgeon: Jerene Bears, MD;  Location: [facility] ENDOSCOPY;  Service: Endoscopy;  Laterality: N/A;  . ILIAC ARTERY STENT Left 2005   CIA  . KNEE ARTHROSCOPY WITH MEDIAL MENISECTOMY Left 03/08/2014   Procedure: LEFT KNEE SCOPE WITH MEDIAL MENISECTOMY AND CHONDROPLASTY;  Surgeon: Ninetta Lights, MD;  Location: [place];  Service: Orthopedics;  Laterality: Left;  . LEFT AND RIGHT HEART CATHETERIZATION WITH CORONARY ANGIOGRAM N/A 06/28/2014   Procedure: LEFT AND RIGHT HEART CATHETERIZATION WITH CORONARY ANGIOGRAM;  Surgeon: Jerline Pain, MD;  Location: [facility] CATH LAB;  Service: Cardiovascular;  Laterality: N/A;  . RIB PLATING Left 11/20/2014   Procedure: RIB PLATING OF LEFT 8TH RIB;  Surgeon: [name], MD;  Location: [place];  Service: Open Heart Surgery;  Laterality: Left;  . RIGHT/LEFT HEART CATH AND CORONARY ANGIOGRAPHY N/A 02/05/2017   Procedure: RIGHT/LEFT HEART CATH AND CORONARY ANGIOGRAPHY;  Surgeon: Sherren Mocha, MD;  Location: [place] CV LAB;  Service: Cardiovascular;  Laterality: N/A;  . TEE WITHOUT CARDIOVERSION N/A 11/20/2014   Procedure: TRANSESOPHAGEAL ECHOCARDIOGRAM (TEE);  Surgeon: [name], MD;  Location: [place];  Service: Open Heart Surgery;  Laterality: N/A;  . TEE WITHOUT CARDIOVERSION N/A 12/19/2015   Procedure: TRANSESOPHAGEAL ECHOCARDIOGRAM (TEE);  Surgeon: Jerline Pain, MD;  Location: [facility] ENDOSCOPY;  Service: Cardiovascular;  Laterality: N/A;  . TONSILLECTOMY    .  TRANSCATHETER AORTIC VALVE REPLACEMENT, TRANSAPICAL N/A 11/20/2014   Procedure: TRANSCATHETER AORTIC VALVE REPLACEMENT, TRANSAPICAL;  Surgeon: [name], MD;  Location: [place];  Service: Open Heart Surgery;  Laterality: N/A;     reports that he quit smoking about 2 years ago. His smoking use included cigarettes and e-cigarettes. He has a 60.00 pack-year smoking history. He has never used smokeless tobacco. He reports that he drinks about 7.2 oz of alcohol per week. He reports that he does not use drugs.  No Known Allergies  Family History  Problem Relation Age of Onset  . Lung disease Mother        pulm fibrosis  . Colitis Father   . Heart disease Brother   . Hypertension Brother   . Hyperlipidemia Brother   . CAD Daughter        cad  . Hypertension Son   . Colon cancer Neg Hx  Family history; unable to obtain from patient due to AMS>   Prior to Admission medications   Medication Sig Start Date End Date Taking? Authorizing Provider  albuterol (PROVENTIL HFA;VENTOLIN HFA) 108 (90 Base) MCG/ACT inhaler Inhale 2 puffs into the lungs every 6 (six) hours as needed for wheezing or shortness of breath (Plan B). 12/08/15   Carollee Herter, Alferd Apa, DO  ALPRAZolam Duanne Moron) 0.5 MG tablet TAKE 1 TABLET BY MOUTH THREE TIMES DAILY 03/12/17   Carollee Herter, Alferd Apa, DO  aspirin 81 MG tablet Take 81 mg by mouth every morning.     [provider]  atorvastatin (LIPITOR) 40 MG tablet Take 1 tablet (40 mg total) by mouth daily. 11/24/16   Ann Held, DO  benzonatate (TESSALON) 100 MG capsule Take 1 capsule (100 mg total) 2 (two) times daily by mouth. 02/08/17   Oretha Milch D, MD  bisoprolol (ZEBETA) 5 MG tablet Take 0.5 tablets (2.5 mg total) by mouth daily. 06/16/16   Jerline Pain, MD  budesonide-formoterol (SYMBICORT) 160-4.5 MCG/ACT inhaler Inhale 2 puffs into the lungs 2 (two) times daily for 1 day. 02/15/17 02/16/17  Tanda Rockers, MD  citalopram (CELEXA) 20 MG tablet Take  1 tablet (20 mg total) by mouth daily. 11/24/16   Ann Held, DO  docusate sodium (COLACE) 100 MG capsule Take 100-200 mg by mouth See admin instructions. 1 capsule by mouth in the morning and 2 at bedtime     [provider]  famotidine (PEPCID) 20 MG tablet Take 20 mg by mouth at bedtime.     [provider]  fenofibrate 160 MG tablet Take 1 tablet (160 mg total) by mouth daily. 11/24/16   Roma Schanz R, DO  Ferrous Sulfate (IRON) 325 (65 Fe) MG TABS TAKE ONE TABLET BY MOUTH TWICE DAILY 01/21/17   Irene Shipper, MD  folic acid (FOLVITE) 1 MG tablet Take 1 tablet (1 mg total) daily by mouth. 02/09/17   Oretha Milch D, MD  furosemide (LASIX) 20 MG tablet Take 2 tablets (40 mg total) daily by mouth. 02/08/17   Oretha Milch D, MD  guaiFENesin (MUCINEX) 600 MG 12 hr tablet Take 1 tablet (600 mg total) by mouth 2 (two) times daily. 09/08/16 09/08/17  Dhungel, Nishant, MD  ipratropium-albuterol (DUONEB) 0.5-2.5 (3) MG/3ML SOLN Take 3 mLs by nebulization every 4 (four) hours as needed (shortness of breath and wheezing plan C).    [provider]  morphine 10 MG/5ML solution Take 1.3 mLs (2.6 mg total) every 6 (six) hours as needed by mouth (shortness of breath). 02/08/17   Desiree Hane, MD  Multiple Vitamins-Minerals (CENTRUM SILVER PO) Take 1 tablet by mouth daily.     [provider]  OXYGEN Use 2L at bedtime and as needed for shortness of breath    [provider]  pantoprazole (PROTONIX) 40 MG tablet Take 1 tablet (40 mg total) by mouth daily. 11/24/16   Ann Held, DO  sodium chloride (OCEAN) 0.65 % SOLN nasal spray Place 1-2 sprays into both nostrils as needed for congestion.     [provider]  sodium chloride (OCEAN) 0.65 % SOLN nasal spray Place 1 spray as needed into both nostrils for congestion. 02/08/17   Oretha Milch D, MD  temazepam (RESTORIL) 30 MG capsule TAKE 1 CAPSULE BY MOUTH ONCE DAILY AT BEDTIME FOR  SLEEP 04/05/17   Carollee Herter, Yvonne R, DO  temazepam (RESTORIL) 30 MG capsule  TAKE 1 CAPSULE BY MOUTH ONCE DAILY AT BEDTIME FOR SLEEP 04/07/17   Carollee Herter, Alferd Apa, DO  thiamine 100 MG tablet Take 1 tablet (100 mg total) daily by mouth. 02/09/17   Oretha Milch D, MD  vitamin C (ASCORBIC ACID) 500 MG tablet Take 500 mg by mouth daily.    [provider]    Physical Exam: Vitals:   08/30/17 1415 08/30/17 1430 08/30/17 1445 08/30/17 1641  BP: 120/69 126/69 126/72   Pulse: 100 99 97 94  Resp: 20 (!) 24 (!) 23   Temp:      TempSrc:      SpO2: 97% 97% 97% 97%  Weight:      Height:        Constitutional: obese, lethargic on BIPAP Vitals:   08/30/17 1415 08/30/17 1430 08/30/17 1445 08/30/17 1641  BP: 120/69 126/69 126/72   Pulse: 100 99 97 94  Resp: 20 (!) 24 (!) 23   Temp:      TempSrc:      SpO2: 97% 97% 97% 97%  Weight:      Height:       Eyes: PERRL, lids and conjunctivae normal ENMT: Mucous membranes are DRY/  Respiratory: On BIPAP, bilateral air movement, ronchus.  Cardiovascular: Regular rate and rhythm, no murmurs / rubs / gallops. LE with cyanosis.  Abdomen: no tenderness, no masses palpated. No hepatosplenomegaly. Bowel sounds positive.  Musculoskeletal: positive cyanosis lower extremity . No joint deformity upper and lower extremities.Skin: no rashes, lesions, ulcers. No induration Neurologic: lethargic, he is following some command, moves LE on command, squeezed my hand on command.  Psychiatric: unable to assess.     Labs on Admission: I have personally reviewed following labs and imaging studies  CBC: Recent Labs  Lab 08/30/17 1051 08/30/17 1152  WBC 19.6*  --   NEUTROABS 17.2*  --   HGB 13.4 13.6  HCT 41.7 40.0  MCV 95.4  --   PLT 150  --    Basic Metabolic Panel: Recent Labs  Lab 08/30/17 1051 08/30/17 1152  NA 145 142  K 3.5 3.4*  CL 101 100*  CO2 29  --   GLUCOSE 167* 166*  BUN 25* 27*  CREATININE 1.18 1.20  CALCIUM 8.8*  --      GFR: Estimated Creatinine Clearance: 46.7 mL/min (by C-G formula based on SCr of 1.2 mg/dL). Liver Function Tests: Recent Labs  Lab 08/30/17 1051  AST 169*  ALT 153*  ALKPHOS 45  BILITOT 0.9  PROT 6.6  ALBUMIN 3.5   No results for input(s): LIPASE, AMYLASE in the last 168 hours. No results for input(s): AMMONIA in the last 168 hours. Coagulation Profile: No results for input(s): INR, PROTIME in the last 168 hours. Cardiac Enzymes: Recent Labs  Lab 08/30/17 1051  TROPONINI 2.51*   BNP (last 3 results) No results for input(s): PROBNP in the last 8760 hours. HbA1C: No results for input(s): HGBA1C in the last 72 hours. CBG: Recent Labs  Lab 08/30/17 1101  GLUCAP 169*   Lipid Profile: No results for input(s): CHOL, HDL, LDLCALC, TRIG, CHOLHDL, LDLDIRECT in the last 72 hours. Thyroid Function Tests: No results for input(s): TSH, T4TOTAL, FREET4, T3FREE, THYROIDAB in the last 72 hours. Anemia Panel: No results for input(s): VITAMINB12, FOLATE, FERRITIN, TIBC, IRON, RETICCTPCT in the last 72 hours. Urine analysis:    Component Value Date/Time   COLORURINE YELLOW 11/16/2014 [place] 11/16/2014 1058   LABSPEC 1.015 11/16/2014 1058  PHURINE 6.5 11/16/2014 1058   GLUCOSEU NEGATIVE 11/16/2014 1058   [place] 11/16/2014 1058   HGBUR negative 11/30/2008 0000   BILIRUBINUR neg 05/09/2015 1106   KETONESUR NEGATIVE 11/16/2014 1058   PROTEINUR neg 05/09/2015 1106   PROTEINUR NEGATIVE 11/16/2014 1058   UROBILINOGEN 0.2 05/09/2015 1106   UROBILINOGEN 0.2 11/16/2014 1058   NITRITE neg 05/09/2015 1106   NITRITE NEGATIVE 11/16/2014 1058   LEUKOCYTESUR Negative 05/09/2015 1106    Radiological Exams on Admission: Dg Chest Portable 1 View  Result Date: 08/30/2017 CLINICAL DATA:  Altered mental status and dyspnea is morning. EXAM: PORTABLE CHEST 1 VIEW COMPARISON:  Single-view of the chest 02/01/2017. CT chest 07/23/2015. FINDINGS: Calcified pleural  plaques are again seen. The lungs are markedly emphysematous with mild basilar atelectasis. No pneumothorax or pleural fluid. Heart size is upper normal. Aortic atherosclerosis is noted. IMPRESSION: No acute disease. Emphysema. Calcified pleural plaques. Atherosclerosis. Electronically Signed   By: Inge Rise M.D.   On: 08/30/2017 11:16    EKG: sinus tachycardia.   Assessment/Plan Active Problems:   Iron deficiency anemia   Essential hypertension   PVD (peripheral vascular disease) (HCC)   S/P TAVR (transcatheter aortic valve replacement)   Chronic respiratory failure with hypoxia and hypercapnia (HCC)   Acute on chronic respiratory failure (HCC)   Paroxysmal atrial fibrillation (HCC)  1-Acute on chronic hypoxic , hypercapnic Respiratory Failure in setting of COPD, Heart failure, aortic valve stenosis, fail TAVR;  Admit to step down for use of BIPAP to allow [place] from out [place].  Unable to contact wife.  IV solumedrol one time dose.  Schedule nebulizer treatment.  IV ativan and pain medication for  comfort.  Patient is under hospice ca, he will be admitted for  Symptoms management.   2-Elevation of troponin. In setting of aortic stenosis, respiratory failure. Heart failure.  Patient is on hospice.  No escalation of care.   3-Lactic acidosis; in setting of Hypoxemia;.  No Fluids, No IV antibiotics. Discussed with son.   4-Chronic combine systolic, diastolic respiratory failure;  On BIPAP.  Chest x ray with no pulmonary edema.   DVT prophylaxis: comfort care  Code Status: DNR  Family Communication: son over phone. Unable to reach wife.  Disposition Plan: admit inpatient, for BIPAP to allow family to arrive to hospital.  and comfort care.  Consults called: palliative care Admission status: inpatient, this a related hospice admission for HOTP.   Elmarie Shiley MD [facility] Pager [phone]  If 7PM-7AM, please contact  night-coverage [email or web address] Password [email or web address]  08/30/2017, 5:13 PM

## 2017-08-30 NOTE — Progress Notes (Signed)
Palliative Medicine RN Note: Consult order noted.  Full note to follow  Active w [facility]; wife wants him to go home. I'm unsure this is safe. Newly on bipap.   Got orders from PMT Dr Hilma Favors for aggressive comfort measures. Pt is very agitated; wife reports he has pain in his hands. She is very comfortable with any aggressive medications.  Per [facility], she is International aid/development worker. Ronald Bell, their liason, will come see Ronald Bell and his wife to clarify [place] and discuss d/c plan. Ronald Bell remains adamant that she wants to take him home, but I'm unsure she would physically be able to provide the needed care, as she is having back pain herself. When I went back to the room to update her, the RN reported that Ronald Bell was up at the registration desk checking herself in for back pain.   I spoke at length with his RN and discussed current goals and plans, and I provided contact information for PMT. Should Ronald Bell and be admitted as GIP for [facility]OP, [facility] would be attending. PMT will continue to monitor symptoms only, as we do not act as attending, and hospices manage discussions re [place] & d/c plans when patients are GIP.   UPDATE: I rec'd a call later in the day that Ronald Bell had gotten upset in the ED and had been escorted out by security. Her children have been contacted by [facility]; plan to take him off the bipap once they arrive from [place] tomorrow. If he survives, d/c plan will be addressed by [facility]. It is likely, however, that he will not survive long off the bipap.  Ronald Skiff [name], RN, BSN, [facility] Palliative Medicine Team 08/30/2017 4:14 PM Office [phone]

## 2017-08-30 NOTE — ED Notes (Signed)
Patient tried climbing out of bed. Said he needed to use the bathroom, given urinal. Unable to use. Changed brief and blankets.

## 2017-08-30 NOTE — ED Notes (Signed)
Pt is able to follow commands at this time.  Pt remains very weak, however he verbalized that he was in no pain and did not want pain medication at this time.  Pt remains on bipap for comfort.

## 2017-08-30 NOTE — ED Notes (Signed)
All pt monitors and frequent VS are ceased at this time.  Pt is comfort care.  Pt remains on bipap for comfort at this time.  Will continue to monitor for any needs or changes.

## 2017-08-30 NOTE — ED Notes (Signed)
Patient increasing agitated trying to pull off mask and get out of bed.

## 2017-08-30 NOTE — ED Triage Notes (Signed)
Pt arrived via [facility] from home on Hospice care.  Pt is a DNR.  Pt was found unresponsive by his wife this AM and called 911.  Pt's wife did not inform EMS that pt was a DNR at the time.  EMS inserted a Nasal trumpet are delivering O2 via NRB.  Pt was given 154ml of D10 for a BG of 66.  Pt is Tachycardic and tachypneic upon arrival to ED.  Pt is unresponsive to verbal or vocal stimuli.  Pt is responsive to pain.

## 2017-08-30 NOTE — ED Notes (Signed)
Pt is more Alert than earlier assessments.  Pt awakens to voice at this time.  Unable to follow commands.

## 2017-08-30 NOTE — ED Notes (Addendum)
Pt's spouse is at bedside, POC is being discussed per Hospice RN, Carmel [place] and spouse. It's recommended that pt be transferred to hospice house.  Spouse immediately declines.  Pt's spouse does not allow Cheri to complete her recommendations before spouse interrupts and states "You haven't done a thing for him."  Pt's spouse has become immediately defensive and verbally abusive to this RN and Carmel Sacramento, Therapist, sports.  This RN states to spouse that a decision regarding end of life care for her spouse is to be made.  Pt's spouse then states to [place], RN "Can I ask you a question? I've hurt my thumb and I've hurt my back, can I check in, to get help with something for pain.?"  Cheri, RN states to patient "you can check in, however I'm with hospice and can't provide you with any information about your emergency room visit."  This RN then asks spouse, "how can we send him home with you on comfort measures if you are not there, you have checked into the Emergency room to be seen already.  Your husband is very sick and end of life treatment is needed and decisions regarding next steps are needed and if you are being evaluated, how will we make decisions regarding his care?"  Pt continues to be verbally abusive and act in a bizarre manor.  Pt goes to the bedside and takes pt's hospice binder that contains his DNR form.  Pt is informed per this RN that she cannot take the DNR form as it is a legal document.  Pt continues to be verbally abusive and security is called to allow this RN to at least obtain a copy of pt's DNR.  Spouse will not talk about a plan of care regarding her husband and has left the Emergency dept at this time.  Hospice RN, Carmel [place] continues to work on Ronald Bell.  Pt is resting in bed.  Remains on bipap at this time.

## 2017-08-30 NOTE — ED Provider Notes (Signed)
[facility] EMERGENCY DEPARTMENT Provider Note   CSN: [id number] Arrival date & time: 08/30/17  1038  LEVEL 5 CAVEAT - ALTERED MENTAL STATUS   History   Chief Complaint Chief Complaint  Patient presents with  . Breathing Problem    HPI Ronald Bell is a 77 y.o. male.  HPI  77 year old male brought in by EMS for somnolence and respiratory distress.  History is limited as the patient is unresponsive.  I am getting history from EMS.  The patient is currently in hospice care.  Wife called EMS due to worsening respiratory status and gave him 2 of his Xanax and a dose of morphine.  She wanted him transported up here.  After the patient arrived I talked to his hospice nurse, Colletta Maryland, who relayed that the patient has been more short of breath and having leg swelling for 3 or 4 days.  Has been doubling the Lasix.  Today was much worse and looks like he is probably dying.  However, because the wife can be difficult to interact with sometimes, they are not able to take him to the hospice house.  Past Medical History:  Diagnosis Date  . Angiodysplasia of intestine with hemorrhage    large and SB, gastric AVMs.   . Anxiety   . Arthritis    "left shoulder" (10/19/2014)  . Carotid artery occlusion   . Carotid artery stenosis 04/22/2012  . COPD GOLD III with min reversibilty  08/11/2006   Followed in Pulmonary clinic/ [facility]/ [place] - PFT's 04/28/2013  FEV1 0.88 (40%) with ratio 44 and 14% better p B2 dlco 45 corrects to 83 - Trial of breo 04/28/2013 > improved symptoms  06/09/2013  - spirometry 06/04/2014 FEV1  0.76 (29%) ratio 45      . Coronary artery disease   . GERD 11/30/2008   Qualifier: Diagnosis of  By: Marijean Niemann CMA, Danielle    . GERD (gastroesophageal reflux disease)   . GI bleed 2010   4 units PRBCs  . History of blood transfusion "couple times"   "related to bleeding in colon and esophagus"  . Hx of adenomatous colonic polyps 2012, 2013.   Marland Kitchen  Hyperlipidemia   . Hypertension   . Iron deficiency anemia 01/25/2009   Qualifier: Diagnosis of  By: Henrene Pastor MD, Docia Chuck   . Irregular heartbeat   . On home oxygen therapy    "2L at night" (07/23/2015)  . Peripheral vascular disease ([place])   . Pleural plaque with presence of asbestos 03/27/2013   Followed in Pulmonary clinic/ [facility]/ [place] - F/u CT 09/08/2013 1. Stable extensive calcified pleural plaque formation consistent with asbestos exposure. 2. Multiple pulmonary nodules are unchanged from the CT of 6 months ago. Given risk factors for lung cancer, continued follow up is recommended with chest CT in 6 months> done 04/20/14 no change >repeat in 12 m in tickle file     . Polyp of nasal cavity   . PVD (peripheral vascular disease) ([place]) 10/18/2012  . Rosacea   . 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 Shingles   . Tobacco abuse     Patient Active Problem List   Diagnosis Date Noted  . Goals of care, counseling/discussion   . Advance care planning   . Palliative care by specialist   . Acute on chronic systolic heart failure ([place])   . Acute pulmonary edema (HCC)   .  Hypoxemia   . Acute congestive heart failure ([place])   . ETOH abuse 01/31/2017  . Hyperglycemia 01/31/2017  . Abnormal EKG 01/31/2017  . Pulmonary fibrosis ([place])   . Acute exacerbation of chronic obstructive pulmonary disease (COPD) ([place])   . Acute renal failure ([place])   . COPD with acute exacerbation ([place]) 07/20/2015  . Paroxysmal atrial fibrillation ([place]) 07/20/2015  . Acute on chronic diastolic HF (heart failure) ([place]) 07/20/2015  . AKI (acute kidney injury) ([place]) 07/20/2015  . COPD with exacerbation ([place]) 07/20/2015  . Mixed hyperlipidemia 05/27/2015  . Depression 05/18/2015  . Acute on chronic respiratory failure ([place]) 05/18/2015  . Elevated lactic acid level 05/18/2015  . Chronic respiratory failure with  hypoxia and hypercapnia ([place]) 01/10/2015  . Pleural effusion, left   . LVH (left ventricular hypertrophy)   . AVM (arteriovenous malformation) of small bowel, acquired with hemorrhage   . History of colonic polyps   . CAD in native artery   . S/P aortic valve replacement   . Irregular heartbeat   . Pleural effusion   . Other emphysema ([place])   . CAD (coronary artery disease) 12/03/2014  . Atrial fibrillation with rapid ventricular response ([place]) 11/23/2014  . 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 Former smoker 11/05/2014  . Unstable angina ([place]) 10/19/2014  . Angiodysplasia of stomach and duodenum   . Angiodysplasia of small intestine, except duodenum without bleeding   . Melena 08/16/2014  . Preop cardiovascular exam 08/16/2014  . Aortic stenosis   . Coronary artery disease involving native coronary artery of native heart without angina pectoris   . Angiodysplasia of colon   . Severe aortic stenosis 06/21/2014  . Shortness of breath   . Aortic stenosis, severe 05/10/2014  . COPD exacerbation ([place]) 04/16/2014  . Multiple pulmonary nodules 03/02/2014  . Iliac artery stenosis, left ([place]) 11/14/2013  . Pain of left thigh 11/14/2013  . Cough 03/27/2013  . Pleural plaque with presence of asbestos 03/27/2013  . PVD (peripheral vascular disease) ([place]) 10/18/2012  . Carotid artery stenosis 04/22/2012  . Postherpetic neuralgia 09/28/2011  . OTHER SPECIFIED DISEASE OF WHITE BLOOD CELLS 01/17/2010  . HEMOCCULT POSITIVE STOOL 01/17/2010  . Iron deficiency anemia 01/25/2009  . [facility] 01/25/2009  . ANGIODYSPLASIA-INTESTINE 01/25/2009  . PERSONAL HX COLONIC POLYPS 01/25/2009  . ANEMIA, SECONDARY TO BLOOD LOSS 12/03/2008  . Hemorrhage of gastrointestinal tract 12/03/2008  . UNSPECIFIED ANEMIA 12/01/2008  . GERD 11/30/2008  . Bilateral carotid bruits 05/25/2008  . POLYP OF NASAL CAVITY 07/25/2007  . OTHER SPECIFIED DISORDERS OF  ARTERIES&ARTERIOLES 06/24/2007  . NECK PAIN, LEFT 06/22/2007  . Benign neoplasm of colon 08/11/2006  . Hyperlipidemia 08/11/2006  . ERECTILE DYSFUNCTION 08/11/2006  . Essential hypertension 08/11/2006  . COPD GOLD III with min reversibilty  08/11/2006  . 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 BARRETT'S ESOPHAGUS, HX OF 08/11/2006  . Cigarette smoker 08/11/2006    Past Surgical History:  Procedure Laterality Date  . CARDIAC CATHETERIZATION  2001; 06/28/2014  . CARDIAC CATHETERIZATION N/A 10/19/2014   Procedure: Coronary Stent Intervention;  Surgeon: Burnell Blanks, MD;  Location: [facility] CV [facility];  Service: Cardiovascular;  Laterality: N/A;  BMS Mid RCA  . COLONOSCOPY  July 2015   Dr. Henrene Pastor  . COLONOSCOPY N/A 08/17/2014   Procedure: COLONOSCOPY;  Surgeon: Jerene Bears, MD;  Location: [facility] ENDOSCOPY;  Service: Endoscopy;  Laterality: N/A;  . COLONOSCOPY N/A 12/05/2014   Procedure: COLONOSCOPY;  Surgeon: Manus Gunning, MD;  Location: [facility] ENDOSCOPY;  Service: Gastroenterology;  Laterality: N/A;  . ENTEROSCOPY N/A 12/05/2014   Procedure: ENTEROSCOPY;  Surgeon: Manus Gunning, MD;  Location: [facility] ENDOSCOPY;  Service: Gastroenterology;  Laterality: N/A;  . ESOPHAGOGASTRODUODENOSCOPY  2012   normal  . ESOPHAGOGASTRODUODENOSCOPY N/A 08/17/2014   Procedure: ESOPHAGOGASTRODUODENOSCOPY (EGD);  Surgeon: Jerene Bears, MD;  Location: [facility] ENDOSCOPY;  Service: Endoscopy;  Laterality: N/A;  . ILIAC ARTERY STENT Left 2005   CIA  . KNEE ARTHROSCOPY WITH MEDIAL MENISECTOMY Left 03/08/2014   Procedure: LEFT KNEE SCOPE WITH MEDIAL MENISECTOMY AND CHONDROPLASTY;  Surgeon: Ninetta Lights, MD;  Location: [facility];  Service: Orthopedics;  Laterality: Left;  . LEFT AND RIGHT HEART CATHETERIZATION WITH CORONARY ANGIOGRAM N/A 06/28/2014   Procedure: LEFT AND RIGHT HEART CATHETERIZATION WITH CORONARY ANGIOGRAM;  Surgeon: Jerline Pain, MD;  Location: [facility] CATH LAB;  Service: Cardiovascular;  Laterality: N/A;  . RIB  PLATING Left 11/20/2014   Procedure: RIB PLATING OF LEFT 8TH RIB;  Surgeon: [name], MD;  Location: [facility];  Service: Open Heart Surgery;  Laterality: Left;  . RIGHT/LEFT HEART CATH AND CORONARY ANGIOGRAPHY N/A 02/05/2017   Procedure: RIGHT/LEFT HEART CATH AND CORONARY ANGIOGRAPHY;  Surgeon: Sherren Mocha, MD;  Location: [facility] CV LAB;  Service: Cardiovascular;  Laterality: N/A;  . TEE WITHOUT CARDIOVERSION N/A 11/20/2014   Procedure: TRANSESOPHAGEAL ECHOCARDIOGRAM (TEE);  Surgeon: [name], MD;  Location: [facility];  Service: Open Heart Surgery;  Laterality: N/A;  . TEE WITHOUT CARDIOVERSION N/A 12/19/2015   Procedure: TRANSESOPHAGEAL ECHOCARDIOGRAM (TEE);  Surgeon: Jerline Pain, MD;  Location: [facility] ENDOSCOPY;  Service: Cardiovascular;  Laterality: N/A;  . TONSILLECTOMY    . TRANSCATHETER AORTIC VALVE REPLACEMENT, TRANSAPICAL N/A 11/20/2014   Procedure: TRANSCATHETER AORTIC VALVE REPLACEMENT, TRANSAPICAL;  Surgeon: [name], MD;  Location: [facility];  Service: Open Heart Surgery;  Laterality: N/A;        Home Medications    Prior to Admission medications   Medication Sig Start Date End Date Taking? Authorizing Provider  albuterol (PROVENTIL HFA;VENTOLIN HFA) 108 (90 Base) MCG/ACT inhaler Inhale 2 puffs into the lungs every 6 (six) hours as needed for wheezing or shortness of breath (Plan B). 12/08/15   Carollee Herter, Alferd Apa, DO  ALPRAZolam Duanne Moron) 0.5 MG tablet TAKE 1 TABLET BY MOUTH THREE TIMES DAILY 03/12/17   Carollee Herter, Alferd Apa, DO  aspirin 81 MG tablet Take 81 mg by mouth every morning.     [provider]  atorvastatin (LIPITOR) 40 MG tablet Take 1 tablet (40 mg total) by mouth daily. 11/24/16   Ann Held, DO  benzonatate (TESSALON) 100 MG capsule Take 1 capsule (100 mg total) 2 (two) times daily by mouth. 02/08/17   Oretha Milch D, MD  bisoprolol (ZEBETA) 5 MG tablet Take 0.5 tablets (2.5 mg total) by mouth daily. 06/16/16   Jerline Pain, MD    budesonide-formoterol (SYMBICORT) 160-4.5 MCG/ACT inhaler Inhale 2 puffs into the lungs 2 (two) times daily for 1 day. 02/15/17 02/16/17  Tanda Rockers, MD  citalopram (CELEXA) 20 MG tablet Take 1 tablet (20 mg total) by mouth daily. 11/24/16   Ann Held, DO  docusate sodium (COLACE) 100 MG capsule Take 100-200 mg by mouth See admin instructions. 1 capsule by mouth in the morning and 2 at bedtime     [provider]  famotidine (PEPCID) 20 MG tablet Take 20 mg by mouth at bedtime.     [provider]  fenofibrate 160 MG tablet Take 1 tablet (160 mg total) by mouth daily. 11/24/16   Roma Schanz R, DO  Ferrous Sulfate (IRON) 325 (65 Fe) MG TABS TAKE ONE TABLET BY MOUTH TWICE DAILY 01/21/17   Irene Shipper, MD  folic acid (FOLVITE) 1 MG tablet Take 1 tablet (1 mg total) daily by mouth. 02/09/17   Oretha Milch D, MD  furosemide (LASIX) 20 MG tablet Take 2 tablets (40 mg total) daily by mouth. 02/08/17   Oretha Milch D, MD  guaiFENesin (MUCINEX) 600 MG 12 hr tablet Take 1 tablet (600 mg total) by mouth 2 (two) times daily. 09/08/16 09/08/17  Dhungel, Nishant, MD  ipratropium-albuterol (DUONEB) 0.5-2.5 (3) MG/3ML SOLN Take 3 mLs by nebulization every 4 (four) hours as needed (shortness of breath and wheezing plan C).    [provider]  morphine 10 MG/5ML solution Take 1.3 mLs (2.6 mg total) every 6 (six) hours as needed by mouth (shortness of breath). 02/08/17   Desiree Hane, MD  Multiple Vitamins-Minerals (CENTRUM SILVER PO) Take 1 tablet by mouth daily.     [provider]  OXYGEN Use 2L at bedtime and as needed for shortness of breath    [provider]  pantoprazole (PROTONIX) 40 MG tablet Take 1 tablet (40 mg total) by mouth daily. 11/24/16   Ann Held, DO  sodium chloride (OCEAN) 0.65 % SOLN nasal spray Place 1-2 sprays into both nostrils as needed for congestion.     [provider]  sodium chloride (OCEAN) 0.65  % SOLN nasal spray Place 1 spray as needed into both nostrils for congestion. 02/08/17   Oretha Milch D, MD  temazepam (RESTORIL) 30 MG capsule TAKE 1 CAPSULE BY MOUTH ONCE DAILY AT BEDTIME FOR SLEEP 04/05/17   Carollee Herter, Yvonne R, DO  temazepam (RESTORIL) 30 MG capsule TAKE 1 CAPSULE BY MOUTH ONCE DAILY AT BEDTIME FOR SLEEP 04/07/17   Ann Held, DO  thiamine 100 MG tablet Take 1 tablet (100 mg total) daily by mouth. 02/09/17   Oretha Milch D, MD  vitamin C (ASCORBIC ACID) 500 MG tablet Take 500 mg by mouth daily.    [provider]    Family History Family History  Problem Relation Age of Onset  . Lung disease Mother        pulm fibrosis  . Colitis Father   . Heart disease Brother   . Hypertension Brother   . Hyperlipidemia Brother   . CAD Daughter        cad  . Hypertension Son   . Colon cancer Neg Hx     Social History Social History   Tobacco Use  . Smoking status: Former Smoker    Packs/day: 1.00    Years: 60.00    Pack years: 60.00    Types: Cigarettes, E-cigarettes    Last attempt to quit: 04/18/2015    Years since quitting: 2.3  . Smokeless tobacco: Never Used  Substance Use Topics  . Alcohol use: Yes    Alcohol/week: 7.2 oz    Types: 12 Cans of beer per week  . Drug use: No     Allergies   Patient has no known allergies.   Review of Systems Review of Systems  Unable to perform ROS: Mental status change     Physical Exam Updated Vital Signs BP 126/72   Pulse 94   Temp 98.4 F (36.9 C) (Axillary)   Resp (!) 23  Ht 5\' 7"  (1.702 m)   Wt 68 kg (150 lb)   SpO2 97%   BMI 23.49 kg/m   Physical Exam  Constitutional: He appears well-developed and well-nourished. He appears lethargic.  HENT:  Head: Normocephalic and atraumatic.  Right Ear: External ear normal.  Left Ear: External ear normal.  Nose: Nose normal.  Eyes: Right eye exhibits no discharge. Left eye exhibits no discharge.  Neck: Neck supple.  Cardiovascular:  Regular rhythm and normal heart sounds. Tachycardia present.  Pulmonary/Chest: Breath sounds normal. Accessory muscle usage present. Tachypnea noted. He is in respiratory distress. He has no wheezes. He has no rales.  Abdominal: Soft. There is no tenderness.  Musculoskeletal: He exhibits edema (BLE, pitting).  Neurological: He appears lethargic.  Opens eyes to voice and physical touch, does not respond or follow commands  Skin: Skin is warm and dry.  Nursing note and vitals reviewed.    ED Treatments / Results  Labs (all labs ordered are listed, but only abnormal results are displayed) Labs Reviewed  COMPREHENSIVE METABOLIC PANEL - Abnormal; Notable for the following components:      Result Value   Glucose, Bld 167 (*)    BUN 25 (*)    Calcium 8.8 (*)    AST 169 (*)    ALT 153 (*)    GFR calc non Af Amer 57 (*)    All other components within normal limits  BRAIN NATRIURETIC PEPTIDE - Abnormal; Notable for the following components:   B Natriuretic Peptide 246.7 (*)    All other components within normal limits  TROPONIN I - Abnormal; Notable for the following components:   Troponin I 2.51 (*)    All other components within normal limits  CBC WITH DIFFERENTIAL/PLATELET - Abnormal; Notable for the following components:   WBC 19.6 (*)    Neutro Abs 17.2 (*)    Lymphs Abs 0.6 (*)    Abs Immature Granulocytes 0.9 (*)    All other components within normal limits  I-STAT ARTERIAL BLOOD GAS, ED - Abnormal; Notable for the following components:   pH, Arterial 7.243 (*)    pCO2 arterial 82.3 (*)    pO2, Arterial 400.0 (*)    Bicarbonate 35.5 (*)    TCO2 38 (*)    Acid-Base Excess 5.0 (*)    All other components within normal limits  I-STAT CHEM 8, ED - Abnormal; Notable for the following components:   Potassium 3.4 (*)    Chloride 100 (*)    BUN 27 (*)    Glucose, Bld 166 (*)    Calcium, Ion 1.10 (*)    All other components within normal limits  I-STAT CG4 LACTIC ACID, ED -  Abnormal; Notable for the following components:   Lactic Acid, Venous 3.00 (*)    All other components within normal limits  I-STAT TROPONIN, ED - Abnormal; Notable for the following components:   Troponin i, poc 3.02 (*)    All other components within normal limits  CBG MONITORING, ED - Abnormal; Notable for the following components:   Glucose-Capillary 169 (*)    All other components within normal limits    EKG EKG Interpretation  Date/Time:  Monday August 30 2017 12:19:30 EDT Ventricular Rate:  113 PR Interval:    QRS Duration: 96 QT Interval:  331 QTC Calculation: 454 R Axis:   13 Text Interpretation:  Sinus tachycardia Probable LVH with secondary repol abnrm No acute changes Nonspecific ST abnormality No significant change since last tracing  Confirmed by Varney Biles [phone]) on 08/30/2017 4:21:13 PM   Radiology Dg Chest Portable 1 View  Result Date: 08/30/2017 CLINICAL DATA:  Altered mental status and dyspnea is morning. EXAM: PORTABLE CHEST 1 VIEW COMPARISON:  Single-view of the chest 02/01/2017. CT chest 07/23/2015. FINDINGS: Calcified pleural plaques are again seen. The lungs are markedly emphysematous with mild basilar atelectasis. No pneumothorax or pleural fluid. Heart size is upper normal. Aortic atherosclerosis is noted. IMPRESSION: No acute disease. Emphysema. Calcified pleural plaques. Atherosclerosis. Electronically Signed   By: Inge Rise M.D.   On: 08/30/2017 11:16    Procedures .Critical Care Performed by: Sherwood Gambler, MD Authorized by: Sherwood Gambler, MD   Critical care provider statement:    Critical care time (minutes):  45   Critical care time was exclusive of:  Separately billable procedures and treating other patients   Critical care was necessary to treat or prevent imminent or life-threatening deterioration of the following conditions:  Respiratory failure   Critical care was time spent personally by me on the following activities:   Development of treatment plan with patient or surrogate, discussions with consultants, evaluation of patient's response to treatment, examination of patient, obtaining history from patient or surrogate, ordering and performing treatments and interventions, ordering and review of laboratory studies, ordering and review of radiographic studies, pulse oximetry, re-evaluation of patient's condition and review of old charts   (including critical care time)  Medications Ordered in ED Medications  HYDROmorphone (DILAUDID) injection 0.5 mg (0.5 mg Intravenous Given 08/30/17 1343)  LORazepam (ATIVAN) injection 1 mg (1 mg Intravenous Not Given 08/30/17 1315)  LORazepam (ATIVAN) injection 1 mg (has no administration in time range)  albuterol (PROVENTIL) (2.5 MG/3ML) 0.083% nebulizer solution 5 mg (5 mg Nebulization Given 08/30/17 1219)  LORazepam (ATIVAN) injection 1 mg (1 mg Intravenous Given 08/30/17 1315)     Initial Impression / Assessment and Plan / ED Course  I have reviewed the triage vital signs and the nursing notes.  Pertinent labs & imaging results that were available during my care of the patient were reviewed by me and considered in my medical decision making (see chart for details).     The patient has significant respiratory distress.  He was placed on BiPAP.  He has a DO NOT RESUSCITATE.  Unfortunately, given his complex history, this appears to be the dying process for him.  His wishes according to both Colletta Maryland, his nurse, and Jaasiel, his son are that he would want comfort measures only.  His wife is not available for the first hour or more and so I was not able to get in contact with her over the phone either.  When she did arrive, she seems to indicate she wants his pain controlled but is hard to understand sometimes and difficult to get a good history from.  The palate of care and hospice nurses have been interacting with her as well.  Unfortunately we are unable to get him into a hospice  house today and so he will be admitted to the hospitalist service for comfort care.  Final Clinical Impressions(s) / ED Diagnoses   Final diagnoses:  Acute on chronic respiratory failure with hypoxia and hypercapnia [facility])    ED Discharge Orders    None       Sherwood Gambler, MD 08/30/17 1717

## 2017-08-30 NOTE — Consult Note (Signed)
[name]:  entered room to find pt on bi-pap minimally reponsive. RN is administering meds to help keep him comfortable.  Wife entered and I introduced myself. She Bell that "he was brought to hospital because Ronald Bell the Nurse with hospice was an hour late." She said "he is dying and needed help" I explained that "I would like to help her and we have the option of taking the pt to our hospice home and doing the exact same care the hospital is doing by giving him meds in his IV to keep him comfortable." She stated "she needed to ask me something" and reports that she "has hurt her thumb and her back and needs to know can she be seen here in hospital." I explained "I wasn't sure because I did not know what kind of insurance she has but that before she did that I needed her help on how to handle the pt situation her husband." She reported "I was not taking him anywhere that he was going home." I explained "he could not go home with that machine helping him breath." She said "he wasn't going anywhere but home." She grabbed the hospice note book which she brought from home and went to leave. The RN at bedside asked about the DNR and that needed to stay with him here in hospital. She become offended and stated" that was her papers she brought them from home and she was leaving with them." The RN called security who come to retrieve the document and make a copy handing the book back to her. The pt wife Ronald Bell felt that the security guard broke her arm during him retrieving the notebook and she began yelling and cussing. It was clear that her arm was not broke.  The pt was escorted out of the ED.  I norified the pt's wife by phone that the pt was going to be admitted over night here in hospital. I explained that we were wanting to help get him home with her. I explained that in order to do this we had to see how he would do off the bi-pap. So we would have to take him off that machine to try and get Him  back home. I explained he may not make it and could pass. She began to say "that she waiting on her friends to come get her and she was coming back up here to take him home with her they have been together 30 years and that she takes care of him and lets him watch whatever he wants on tv." She said "are you the blonde I met earlier?" I stated "I was." She said that she wasn't talking to me anymore and called me many choice names before hanging up on me.   I called to speak to the pt's son Ronald Bell. I did explain to him that our goal was to get the pt back home but he could not go with t he machine. I explained that we will need to take that machine off and see how he does but when we do that it is unknown and the pt may pass. He and his sister will be leaving [place] at 400 pm today and has asked that if we do that could we wait for him to be present so if his dad passes he will have family here with him. I explained that was reasonable.  Spoke to RN and she will notify the MD of plans. Webb Silversmith RN  [phone] °

## 2017-08-31 DIAGNOSIS — N179 Acute kidney failure, unspecified: Secondary | ICD-10-CM

## 2017-08-31 DIAGNOSIS — I214 Non-ST elevation (NSTEMI) myocardial infarction: Secondary | ICD-10-CM

## 2017-08-31 DIAGNOSIS — G9341 Metabolic encephalopathy: Secondary | ICD-10-CM

## 2017-08-31 LAB — URINALYSIS, ROUTINE W REFLEX MICROSCOPIC
BACTERIA UA: NONE SEEN
BILIRUBIN URINE: NEGATIVE
Glucose, UA: NEGATIVE mg/dL
Hgb urine dipstick: NEGATIVE
Ketones, ur: 20 mg/dL — AB
LEUKOCYTES UA: NEGATIVE
Nitrite: NEGATIVE
PH: 5 (ref 5.0–8.0)
Protein, ur: 30 mg/dL — AB
SPECIFIC GRAVITY, URINE: 1.019 (ref 1.005–1.030)

## 2017-08-31 LAB — BASIC METABOLIC PANEL
Anion gap: 15 (ref 5–15)
BUN: 21 mg/dL — AB (ref 6–20)
CHLORIDE: 104 mmol/L (ref 101–111)
CO2: 32 mmol/L (ref 22–32)
Calcium: 9 mg/dL (ref 8.9–10.3)
Creatinine, Ser: 0.91 mg/dL (ref 0.61–1.24)
GFR calc Af Amer: >60 mL/min (ref >60)
GFR calc non Af Amer: >60 mL/min (ref >60)
GLUCOSE: 113 mg/dL — AB (ref 65–99)
POTASSIUM: 3.8 mmol/L (ref 3.5–5.1)
Sodium: 151 mmol/L — ABNORMAL HIGH (ref 135–145)

## 2017-08-31 LAB — I-STAT ARTERIAL BLOOD GAS, ED
Acid-Base Excess: 5 mmol/L — ABNORMAL HIGH (ref 0.0–2.0)
BICARBONATE: 30.7 mmol/L — AB (ref 20.0–28.0)
O2 SAT: 96 %
PCO2 ART: 48.6 mmHg — AB (ref 32.0–48.0)
PO2 ART: 81 mmHg — AB (ref 83.0–108.0)
Patient temperature: 98.6
TCO2: 32 mmol/L (ref 22–32)
pH, Arterial: 7.409 (ref 7.350–7.450)

## 2017-08-31 LAB — MRSA PCR SCREENING: MRSA BY PCR: NEGATIVE

## 2017-08-31 LAB — PROTIME-INR
INR: 1.22
PROTHROMBIN TIME: 15.3 s — AB (ref 11.4–15.2)

## 2017-08-31 LAB — APTT: aPTT: 28 seconds (ref 24–36)

## 2017-08-31 LAB — TROPONIN I: Troponin I: 27.85 ng/mL (ref <0.03)

## 2017-08-31 MED ORDER — SODIUM CHLORIDE 0.9 % IV SOLN
500.0000 mg | INTRAVENOUS | Status: DC
  Administered 2017-08-31 – 2017-09-01 (×2): 500 mg via INTRAVENOUS
  Filled 2017-08-31 (×2): qty 500

## 2017-08-31 MED ORDER — IPRATROPIUM-ALBUTEROL 0.5-2.5 (3) MG/3ML IN SOLN: 3.0000 mL | RESPIRATORY_TRACT | Status: DC | PRN

## 2017-08-31 MED ORDER — HYDROMORPHONE HCL 2 MG/ML IJ SOLN
1.0000 mg | INTRAMUSCULAR | Status: DC
  Administered 2017-08-31: 1 mg via INTRAVENOUS
  Filled 2017-08-31: qty 1

## 2017-08-31 MED ORDER — LORAZEPAM 2 MG/ML IJ SOLN
0.5000 mg | INTRAMUSCULAR | Status: DC | PRN
  Administered 2017-08-31 – 2017-09-01 (×5): 0.5 mg via INTRAVENOUS
  Filled 2017-08-31 (×5): qty 1

## 2017-08-31 MED ORDER — HEPARIN BOLUS VIA INFUSION
2000.0000 [IU] | Freq: Once | INTRAVENOUS | Status: AC
  Administered 2017-08-31: 2000 [IU] via INTRAVENOUS
  Filled 2017-08-31: qty 2000

## 2017-08-31 MED ORDER — METHYLPREDNISOLONE SODIUM SUCC 40 MG IJ SOLR
40.0000 mg | Freq: Three times a day (TID) | INTRAMUSCULAR | Status: DC
  Administered 2017-08-31 – 2017-09-01 (×4): 40 mg via INTRAVENOUS
  Filled 2017-08-31 (×4): qty 1

## 2017-08-31 MED ORDER — IPRATROPIUM-ALBUTEROL 0.5-2.5 (3) MG/3ML IN SOLN: 3.0000 mL | Freq: Four times a day (QID) | RESPIRATORY_TRACT | Status: DC

## 2017-08-31 MED ORDER — ACETAMINOPHEN 325 MG PO TABS: 650.0000 mg | ORAL_TABLET | Freq: Four times a day (QID) | ORAL | Status: DC | PRN

## 2017-08-31 MED ORDER — DEXTROSE-NACL 5-0.45 % IV SOLN
INTRAVENOUS | Status: DC
  Administered 2017-08-31: 50 mL/h via INTRAVENOUS

## 2017-08-31 MED ORDER — ACETAMINOPHEN 650 MG RE SUPP: 650.0000 mg | Freq: Four times a day (QID) | RECTAL | Status: DC | PRN

## 2017-08-31 MED ORDER — ORAL CARE MOUTH RINSE
15.0000 mL | Freq: Two times a day (BID) | OROMUCOSAL | Status: DC
  Administered 2017-09-01: 15 mL via OROMUCOSAL

## 2017-08-31 MED ORDER — ATORVASTATIN CALCIUM 40 MG PO TABS: 40.0000 mg | ORAL_TABLET | Freq: Every day | ORAL | Status: DC

## 2017-08-31 MED ORDER — CARVEDILOL 3.125 MG PO TABS: 3.1250 mg | ORAL_TABLET | Freq: Two times a day (BID) | ORAL | Status: DC

## 2017-08-31 MED ORDER — HYDROMORPHONE HCL 2 MG/ML IJ SOLN: 0.5000 mg | INTRAMUSCULAR | Status: DC | PRN

## 2017-08-31 MED ORDER — HYDROMORPHONE HCL 2 MG/ML IJ SOLN
0.5000 mg | INTRAMUSCULAR | Status: DC
  Administered 2017-08-31 – 2017-09-01 (×7): 0.5 mg via INTRAVENOUS
  Filled 2017-08-31 (×7): qty 1

## 2017-08-31 MED ORDER — ALBUTEROL SULFATE (2.5 MG/3ML) 0.083% IN NEBU: 2.5000 mg | INHALATION_SOLUTION | RESPIRATORY_TRACT | Status: DC | PRN

## 2017-08-31 MED ORDER — IPRATROPIUM-ALBUTEROL 0.5-2.5 (3) MG/3ML IN SOLN
3.0000 mL | Freq: Four times a day (QID) | RESPIRATORY_TRACT | Status: DC
  Administered 2017-08-31 – 2017-09-01 (×4): 3 mL via RESPIRATORY_TRACT
  Filled 2017-08-31 (×5): qty 3

## 2017-08-31 MED ORDER — HEPARIN (PORCINE) IN NACL 100-0.45 UNIT/ML-% IJ SOLN
1000.0000 [IU]/h | INTRAMUSCULAR | Status: DC
  Administered 2017-08-31: 1000 [IU]/h via INTRAVENOUS
  Filled 2017-08-31: qty 250

## 2017-08-31 MED ORDER — ASPIRIN EC 81 MG PO TBEC: 81.0000 mg | DELAYED_RELEASE_TABLET | Freq: Every day | ORAL | Status: DC

## 2017-08-31 MED ORDER — LISINOPRIL 2.5 MG PO TABS: 2.5000 mg | ORAL_TABLET | Freq: Every day | ORAL | Status: DC

## 2017-08-31 NOTE — Progress Notes (Signed)
   08/31/17 1036  Clinical Encounter Type  Visited With Patient;Health care provider  Visit Type Initial;ED (Palliative)  Referral From Palliative care team  Consult/Referral To Chaplain  Spiritual Encounters  Spiritual Needs  (comfort support)   Encountered Melanie from Palliative in hall and I went with her to visit this patient.  Remained at bedside and provided support and comfort to the patient.  Daughter and Son are on the way from [name] and will be here this afternoon.  Patient was pleased to know this.  I asked if he was scared and he said a little.  Appears to have a lot on his mind, but did not articulate particular concerns.  Will follow and support as needed. Chaplain Katherene Ponto

## 2017-08-31 NOTE — ED Notes (Signed)
[facility] called for transport at 1346 by [facility]. Transport will be available at approx [phone].

## 2017-08-31 NOTE — Progress Notes (Addendum)
Follow-up troponin up to 27.8, follow-up EKG with V5 and V6 ST depressions.  Patient will be placed on medical therapy for non-ST elevation myocardial infarction, including IV heparin, beta-blockade, ACE inhibitor, aspirin and statin.  Continue pain control with hydromorphone.  Follow-up echocardiography.  Patient does have a very poor prognosis related to his aortic stenosis, likely not candidate for PCI.  Case was discussed with cardiology service who will follow up during this hospitalization.

## 2017-08-31 NOTE — ED Notes (Signed)
Respiratory at bedside and MD on phone asking if possible to attempt wean from bipap. Pt placed on nasal canula and appears more comfortable. accesory muscles noted but does not appear labored.

## 2017-08-31 NOTE — ED Notes (Signed)
Changed patients brief and redirected back in bed.

## 2017-08-31 NOTE — Progress Notes (Signed)
I spoke with Mrs. [place]g over the phone, she agrees with patient being DNR, at the same time she wants him to be treated for his acute condition, including non invasive mechanical ventilation (Bipap). Will transfer patient to [facility] step down unit.   All questions were addressed.

## 2017-08-31 NOTE — ED Notes (Addendum)
Daughter called and at verbal permission of pt was updated. Pt was cleaned with fresh brief and condom cath applied.Pt is alert and answers questions. Follow command but some confusion to time place and events. Pt reoiriented.

## 2017-08-31 NOTE — Progress Notes (Signed)
Palliative Medicine RN Note: Patient is GIP with [facility]; PMT is seeing for symptoms only.  Patient remains on monitors, did not get prn dilaudid. RN reports it is ED policy that he remain on all monitors regardless of his GIP/hospice status.  Obtained orders to scheduled hydromorphone. Discussed orders with RN & with hospice provider. He has already been taken off bipap and changed to [place] to prevent a move to [facility].  I spoke to his daughter, who had called earlier; she put me on speaker pphone with his son. While both of his children aren't ready for him to die, they state clearly that he would not want aggressive care at EOL, which is in agreement with what his wife (their step mother) said yesterday. All state that he was very independent and wouldn't even want bipap. They understand plan for comfort care, and they want him to go to hospice home.  At that time, Cheri w [facility] arrived. She reports that his kids will pick up his wife (their step mother) on the way to the hospital. They will have further discussions re home with hospice vs hospice home at that time.   PMT is about to have team rounds. I will continue to discuss the case with Cheri with [facility].    UPDATE: Rec'd call from ED at 1225. TRH MD has spoken with Mrs Goswami. Plan now to transfer to [place]. I spoke with Cheri w [facility], who was unaware of this change. Mrs Chrismer will have to sign papers revoking hospice care.  At this time, PMT will step back, as GOC are unclear and comfort orders were in place. We will continue to monitor the situation, and we will be available should the situation change.  Marjie Skiff Karion Cudd, RN, BSN, [facility] Palliative Medicine Team 08/31/2017 12:29 PM Office [phone]

## 2017-08-31 NOTE — Progress Notes (Signed)
RT removed patient from bipap and placed on 3lnc. Vitals stable. Patient is tolerating well. RT will continue to monitor.

## 2017-08-31 NOTE — ED Notes (Signed)
Patient agitated with biPAP, trying to pull off mask and get out of bed.

## 2017-08-31 NOTE — ED Notes (Addendum)
Pt denies pain but c/o some shob.Resp even with some accessory muscle use clear but diminisheds. Covers again replaced on pt

## 2017-08-31 NOTE — Progress Notes (Signed)
This note also relates to the following rows which could not be included: SpO2 - Cannot attach notes to unvalidated device data  O2 monitor not reading accurately due to pt moving continuously.  On arrival, sat was 96%

## 2017-08-31 NOTE — Consult Note (Signed)
[facility]:  Pt remains current hospice pt with our services. We will follow pt to [facility].  Family hoping to visit this afternoon once they arrive. They will be bringing the wife with them. Pt is getting medication for comfort and to help with his SOB and agitation. He continues to show signs of SOB with 3 word dyspnea resting in ED bed.  Let him speak to his wife-Ronald Bell, Son Ronald Bell and Daughter Ronald Bell over phone on speaker. We will continue to follow the pt while in hospital unless goals change at this time. Webb Silversmith RN [phone]

## 2017-08-31 NOTE — Progress Notes (Signed)
PROGRESS NOTE    CASANOVA SCHURMAN  OIN:[id number] DOB: 1940/04/21 DOA: 08/30/2017 PCP: Ann Held, DO    Brief Narrative:  77 year old male who presented with dyspnea.  He does have a significant past medical history for COPD with chronic hypoxic respiratory failure, paroxysmal atrial fibrillation, systolic heart failure, aortic valve stenosis status post T aVR.  Patient was found unresponsive by his wife, EMS found him in significant distress, he was transported to the emergency department where he was placed on noninvasive mechanical ventilation.  On his initial physical examination his blood pressure 120/69, heart rate 100, respiratory 24, oxygen saturation 97% on BiPAP.  He was lethargic, dry mucous membranes, lungs with decreased breath sounds bilaterally, poor air movement, the abdomen was soft nontender, lower extremities with no edema, ecchymosis in his left foot.  Somnolent but able to follow simple commands.  Sodium 145, potassium 3.5, chloride 101, bicarb 29, glucose 167, BUN 25, creatinine 1.18, AST 169, ALT 153, troponin 2.5, arterial blood gas 7.24 /82.3 /400 /38 /oxygen saturation 100%. white cell count 19.6, hemoglobin 13.4, hematocrit 150.  Chest x-ray with hyperinflation, increased lung markings bilaterally no pleural effusions, EKG with sinus tachycardia, normal axis, normal intervals, poor R wave progression, lateral T wave inversions (new).   Patient was admitted to the hospital with working diagnosis of acute on chronic hypercapnic respiratory failure.    Assessment & Plan:   Active Problems:   Iron deficiency anemia   Essential hypertension   PVD (peripheral vascular disease) (HCC)   S/P TAVR (transcatheter aortic valve replacement)   Chronic respiratory failure with hypoxia and hypercapnia (HCC)   Acute on chronic respiratory failure (HCC)   Paroxysmal atrial fibrillation ([place])   Respiratory failure ([place])   1.  Acute hypercapnic respiratory failure due  to COPD exacerbation. Patient will be admitted to the step down unit for close monitoring, will order repeat ABG, add aggressive bronchodilator therapy and systemic steroids. Antibiotic therapy with Azithromycin, will continue to use Bipap as needed. Patient is do not intubate, but patient's wife agrees with non invasive mechanical ventilation.   2. Systolic heart failure in the setting of aortic stenosis sp T aVR/ left ventricle ejection fraction 35 to 40%. Recent cardiovascular evaluation on 01/2017 found recurrent aortic valve stenosis with increased gradient up to 40 mmHg and calculated area of 0.54 cm2 , not candidate for further invasive procedures. Today with no significant signs of volume overload or pulmonary edema, will hold on diuresis for now.   3. Ischemic heart disease. Coronary artery disease sp stent RCA, has severe calcific LAD/ first diagonal. No current chest pain, will continue to trend troponin.  EKG with new lateral t wave inversions, if worsening troponin may need IV heparin.    4. Paroxysmal atrial fibrillation. Will continue telemetry monitoring, no clinical signs of volume overload. Rate has been well controlled. Currently not on anticoagulation.  5. Metabolic encephalopathy. Patient very somnolent, will check abg now and continue neuro checks. Aspiration precautions. Will continue low dose hydromorphone for pain and lorazepam for anxiety, patient is very poor prognosis and patient's wife have expressed desire to keep patient as comfortable as possible.   6. AKI. Likely prerenal, serum cr up to 1,20, K at 3.4 and bicab at 27 with cl at 100. Will continue to follow on renal function and electrolytes, will add gentle IV fluids with D51/2 ns at 50 ml per hour.   Patient is critically ill with acute hypercapnic respiratory failure, will continue medical  therapy and non invasive mechanical ventilation to prevent imminent deterioration. Critical care time: 60 minutes.   DVT  prophylaxis: enoxaparin   Code Status: DNR Family Communication: I spoke with patient's wife over the phone at the bedside and all questions were addressed.  Disposition Plan: Step down at [facility]    Consultants:   Palliative care  Procedures:     Antimicrobials:       Subjective: Patient is somnolent, unable to give detailed history due to encephalopathy. All information from nursing at the bedside. Apparently patient was uncomfortable on bipap    Objective: Vitals:   08/31/17 1030 08/31/17 1100 08/31/17 1130 08/31/17 1145  BP: 128/80 (!) 139/52 (!) 144/57   Pulse: 88 81  87  Resp:   20   Temp:      TempSrc:      SpO2: 95% 92% 92% 94%  Weight:      Height:       No intake or output data in the 24 hours ending 08/31/17 1220 Filed Weights   08/30/17 1108  Weight: 68 kg (150 lb)    Examination:   General: deconditioned and ill looking appearing Neurology: somnolent, not following commands, opens eyes to voice and touch.  E ENT: positive pallor, no icterus, oral mucosa moist/ wide neck Cardiovascular: No JVD. S1-S2 present, rhythmic, no gallops, rubs. Positive systolic murmurs at the base 3/6. Positive pedeal edema. Pulmonary: decreased breath sounds bilaterally, very poor air movement, no wheezing, rhonchi or rales. Gastrointestinal. Abdomen protuberant and distended, no organomegaly, non tender, no rebound or guarding Skin. Left foot echymosis Musculoskeletal: no joint deformities     Data Reviewed: I have personally reviewed following labs and imaging studies  CBC: Recent Labs  Lab 08/30/17 1051 08/30/17 1152  WBC 19.6*  --   NEUTROABS 17.2*  --   HGB 13.4 13.6  HCT 41.7 40.0  MCV 95.4  --   PLT 150  --    Basic Metabolic Panel: Recent Labs  Lab 08/30/17 1051 08/30/17 1152  NA 145 142  K 3.5 3.4*  CL 101 100*  CO2 29  --   GLUCOSE 167* 166*  BUN 25* 27*  CREATININE 1.18 1.20  CALCIUM 8.8*  --    GFR: Estimated Creatinine  Clearance: 46.7 mL/min (by C-G formula based on SCr of 1.2 mg/dL). Liver Function Tests: Recent Labs  Lab 08/30/17 1051  AST 169*  ALT 153*  ALKPHOS 45  BILITOT 0.9  PROT 6.6  ALBUMIN 3.5   No results for input(s): LIPASE, AMYLASE in the last 168 hours. No results for input(s): AMMONIA in the last 168 hours. Coagulation Profile: No results for input(s): INR, PROTIME in the last 168 hours. Cardiac Enzymes: Recent Labs  Lab 08/30/17 1051  TROPONINI 2.51*   BNP (last 3 results) No results for input(s): PROBNP in the last 8760 hours. HbA1C: No results for input(s): HGBA1C in the last 72 hours. CBG: Recent Labs  Lab 08/30/17 1101  GLUCAP 169*   Lipid Profile: No results for input(s): CHOL, HDL, LDLCALC, TRIG, CHOLHDL, LDLDIRECT in the last 72 hours. Thyroid Function Tests: No results for input(s): TSH, T4TOTAL, FREET4, T3FREE, THYROIDAB in the last 72 hours. Anemia Panel: No results for input(s): VITAMINB12, FOLATE, FERRITIN, TIBC, IRON, RETICCTPCT in the last 72 hours.    Radiology Studies: I have reviewed all of the imaging during this hospital visit personally     Scheduled Meds: .  HYDROmorphone (DILAUDID) injection  0.5 mg Intravenous Q4H  .  ipratropium-albuterol  3 mL Nebulization Q6H  . methylPREDNISolone (SOLU-MEDROL) injection  40 mg Intravenous Q8H   Continuous Infusions: . azithromycin       LOS: 1 day        Caroleen Stoermer Gerome Apley, MD [facility] Pager [phone]

## 2017-08-31 NOTE — Progress Notes (Signed)
CRITICAL VALUE ALERT  Critical Value:  Trop 27.85  Date & Time Notied:  08/31/17   1625   Provider Notified:Dr Arrien  Orders Received/Actions taken: Waiting for call back

## 2017-08-31 NOTE — ED Notes (Signed)
Pt sleeping and respirations do not appear labored. Bipap in place.

## 2017-08-31 NOTE — Progress Notes (Signed)
ANTICOAGULATION CONSULT NOTE - Initial Consult  Pharmacy Consult for Heparin Indication: chest pain/ACS  No Known Allergies  Patient Measurements: Height: 5\' 7"  (170.2 cm) Weight: 138 lb 10.7 oz (62.9 kg) IBW/kg (Calculated) : 66.1 Heparin Dosing Weight: 63kg  Vital Signs: Temp: 98.3 F (36.8 C) (06/11 1534) Temp Source: Oral (06/11 1534) BP: 160/73 (06/11 1629) Pulse Rate: 98 (06/11 1629)  Labs: Recent Labs    08/30/17 1051 08/30/17 1152 08/31/17 1258  HGB 13.4 13.6  --   HCT 41.7 40.0  --   PLT 150  --   --   CREATININE 1.18 1.20 0.91  TROPONINI 2.51*  --  27.85*    Estimated Creatinine Clearance: 58.6 mL/min (by C-G formula based on SCr of 0.91 mg/dL).   Medical History: Past Medical History:  Diagnosis Date  . Angiodysplasia of intestine with hemorrhage    large and SB, gastric AVMs.   . Anxiety   . Arthritis    "left shoulder" (10/19/2014)  . Carotid artery occlusion   . Carotid artery stenosis 04/22/2012  . COPD GOLD III with min reversibilty  08/11/2006   Followed in Pulmonary clinic/ [facility]/ [facility] - PFT's 04/28/2013  FEV1 0.88 (40%) with ratio 44 and 14% better p B2 dlco 45 corrects to 83 - Trial of breo 04/28/2013 > improved symptoms  06/09/2013  - spirometry 06/04/2014 FEV1  0.76 (29%) ratio 45      . Coronary artery disease   . GERD 11/30/2008   Qualifier: Diagnosis of  By: Marijean Niemann CMA, Danielle    . GERD (gastroesophageal reflux disease)   . GI bleed 2010   4 units PRBCs  . History of blood transfusion "couple times"   "related to bleeding in colon and esophagus"  . Hx of adenomatous colonic polyps 2012, 2013.   Marland Kitchen Hyperlipidemia   . Hypertension   . Iron deficiency anemia 01/25/2009   Qualifier: Diagnosis of  By: Henrene Pastor MD, Docia Chuck   . Irregular heartbeat   . On home oxygen therapy    "2L at night" (07/23/2015)  . Peripheral vascular disease ([place])   . Pleural plaque with presence of asbestos 03/27/2013   Followed in Pulmonary clinic/ [facility]/ [facility] - F/u CT 09/08/2013 1. Stable extensive calcified pleural plaque formation consistent with asbestos exposure. 2. Multiple pulmonary nodules are unchanged from the CT of 6 months ago. Given risk factors for lung cancer, continued follow up is recommended with chest CT in 6 months> done 04/20/14 no change >repeat in 12 m in tickle file     . Polyp of nasal cavity   . PVD (peripheral vascular disease) ([place]) 10/18/2012  . Rosacea   . 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 Shingles   . Tobacco abuse     Medications:  Scheduled:  . aspirin EC  81 mg Oral Daily  . atorvastatin  40 mg Oral q1800  . carvedilol  3.125 mg Oral BID WC  .  HYDROmorphone (DILAUDID) injection  0.5 mg Intravenous Q4H  . ipratropium-albuterol  3 mL Nebulization Q6H  . lisinopril  2.5 mg Oral Daily  . mouth rinse  15 mL Mouth Rinse BID  . methylPREDNISolone (SOLU-MEDROL) injection  40 mg Intravenous Q8H   Infusions:  . azithromycin Stopped (08/31/17 1408)  . dextrose 5 % and 0.45% NaCl 50 mL/hr (08/31/17 1417)   PRN: acetaminophen **OR** acetaminophen, ipratropium-albuterol, LORazepam, ondansetron **OR**  ondansetron (ZOFRAN) IV  Assessment: 77 yo male with PMH of COPD, PAF not on anticoagulation, heart failure, aortic valve stenosis status post TaVR who was found unresponsive by his wife.  Pharmacy consulted to dose IV heparin for ACS (troponins 2.51 > 27.85).   Baseline PTT, PT/INR pending  CBC: Hgb 13.6, Plts 150  SCr 0.91, CrCl ~58 ml/min  No anticoagulations prior to admission  Goal of Therapy:  Heparin level 0.3-0.7 units/ml Monitor platelets by anticoagulation protocol: Yes   Plan:   Heparin 2000 units IV bolus  Heparin 1000 units/hr IV infusion  Check heparin level in 8hrs  Daily heparin level and CBC  Peggyann Juba, PharmD, BCPS Pager: [phone] 08/31/2017,4:55 PM

## 2017-08-31 NOTE — ED Notes (Signed)
Paliative care nurse at bedsdie

## 2017-08-31 NOTE — ED Notes (Signed)
Pt state he needs to void but is unable to void when helped with urinal.

## 2017-08-31 NOTE — ED Notes (Signed)
Patient moved to hospital bed and brief changed. BiPAP still in place.

## 2017-09-01 ENCOUNTER — Inpatient Hospital Stay (HOSPITAL_COMMUNITY)

## 2017-09-01 DIAGNOSIS — J9621 Acute and chronic respiratory failure with hypoxia: Secondary | ICD-10-CM

## 2017-09-01 DIAGNOSIS — J9612 Chronic respiratory failure with hypercapnia: Secondary | ICD-10-CM

## 2017-09-01 DIAGNOSIS — I10 Essential (primary) hypertension: Secondary | ICD-10-CM

## 2017-09-01 DIAGNOSIS — J9611 Chronic respiratory failure with hypoxia: Secondary | ICD-10-CM

## 2017-09-01 DIAGNOSIS — I480 Paroxysmal atrial fibrillation: Secondary | ICD-10-CM

## 2017-09-01 DIAGNOSIS — R748 Abnormal levels of other serum enzymes: Secondary | ICD-10-CM

## 2017-09-01 DIAGNOSIS — Z952 Presence of prosthetic heart valve: Secondary | ICD-10-CM

## 2017-09-01 DIAGNOSIS — J9622 Acute and chronic respiratory failure with hypercapnia: Principal | ICD-10-CM

## 2017-09-01 DIAGNOSIS — R0600 Dyspnea, unspecified: Secondary | ICD-10-CM

## 2017-09-01 LAB — CBC WITH DIFFERENTIAL/PLATELET
BASOS PCT: 0 %
Basophils Absolute: 0 10*3/uL (ref 0.0–0.1)
EOS ABS: 0 10*3/uL (ref 0.0–0.7)
Eosinophils Relative: 0 %
HEMATOCRIT: 29.5 % — AB (ref 39.0–52.0)
HEMOGLOBIN: 9.8 g/dL — AB (ref 13.0–17.0)
LYMPHS ABS: 0.4 10*3/uL — AB (ref 0.7–4.0)
Lymphocytes Relative: 2 %
MCH: 32.1 pg (ref 26.0–34.0)
MCHC: 33.2 g/dL (ref 30.0–36.0)
MCV: 96.7 fL (ref 78.0–100.0)
Monocytes Absolute: 0.5 10*3/uL (ref 0.1–1.0)
Monocytes Relative: 3 %
NEUTROS ABS: 14.6 10*3/uL — AB (ref 1.7–7.7)
NEUTROS PCT: 95 %
Platelets: 158 10*3/uL (ref 150–400)
RBC: 3.05 MIL/uL — AB (ref 4.22–5.81)
RDW: 15 % (ref 11.5–15.5)
WBC: 15.5 10*3/uL — AB (ref 4.0–10.5)

## 2017-09-01 LAB — CBC
HEMATOCRIT: 25.6 % — AB (ref 39.0–52.0)
HEMOGLOBIN: 8.5 g/dL — AB (ref 13.0–17.0)
MCH: 32.1 pg (ref 26.0–34.0)
MCHC: 33.2 g/dL (ref 30.0–36.0)
MCV: 96.6 fL (ref 78.0–100.0)
Platelets: 153 10*3/uL (ref 150–400)
RBC: 2.65 MIL/uL — ABNORMAL LOW (ref 4.22–5.81)
RDW: 15.1 % (ref 11.5–15.5)
WBC: 15.4 10*3/uL — AB (ref 4.0–10.5)

## 2017-09-01 LAB — BASIC METABOLIC PANEL
Anion gap: 10 (ref 5–15)
BUN: 32 mg/dL — ABNORMAL HIGH (ref 6–20)
CHLORIDE: 109 mmol/L (ref 101–111)
CO2: 32 mmol/L (ref 22–32)
Calcium: 8.1 mg/dL — ABNORMAL LOW (ref 8.9–10.3)
Creatinine, Ser: 0.78 mg/dL (ref 0.61–1.24)
GFR calc non Af Amer: >60 mL/min (ref >60)
Glucose, Bld: 193 mg/dL — ABNORMAL HIGH (ref 65–99)
POTASSIUM: 3.9 mmol/L (ref 3.5–5.1)
Sodium: 151 mmol/L — ABNORMAL HIGH (ref 135–145)

## 2017-09-01 LAB — TROPONIN I: TROPONIN I: 20.85 ng/mL — AB (ref <0.03)

## 2017-09-01 LAB — ECHOCARDIOGRAM COMPLETE
Height: 67 in
WEIGHTICAEL: 2218.71 [oz_av]

## 2017-09-01 LAB — HEPARIN LEVEL (UNFRACTIONATED): Heparin Unfractionated: 0.7 IU/mL (ref 0.30–0.70)

## 2017-09-01 MED ORDER — METOPROLOL TARTRATE 5 MG/5ML IV SOLN
5.0000 mg | Freq: Once | INTRAVENOUS | Status: DC
  Filled 2017-09-01: qty 5

## 2017-09-01 MED ORDER — PANTOPRAZOLE SODIUM 40 MG IV SOLR
40.0000 mg | INTRAVENOUS | Status: DC
  Administered 2017-09-01: 40 mg via INTRAVENOUS
  Filled 2017-09-01: qty 40

## 2017-09-01 NOTE — Consult Note (Signed)
[facility]:  Met with Demitrious Mccannon, daughter Pleas Koch daughter and wife Lukas Pelcher. There is extended family presant as well.  Explained hospice care at our hospice home. All of the family including wife is in agreement with comfort care and transferring him to the [facility]. Elmyra Ricks MSW made aware of the plans.  Jannette Fogo RN

## 2017-09-01 NOTE — Progress Notes (Signed)
  Echocardiogram 2D Echocardiogram has been performed.  Ronald Bell 09/01/2017, 10:57 AM

## 2017-09-01 NOTE — Progress Notes (Signed)
ANTICOAGULATION CONSULT NOTE - f/u Consult  Pharmacy Consult for Heparin Indication: chest pain/ACS  No Known Allergies  Patient Measurements: Height: 5\' 7"  (170.2 cm) Weight: 138 lb 10.7 oz (62.9 kg) IBW/kg (Calculated) : 66.1 Heparin Dosing Weight: 63kg  Vital Signs: Temp: 99.6 F (37.6 C) (06/12 0327) Temp Source: Oral (06/12 0327) BP: 143/65 (06/12 0500) Pulse Rate: 118 (06/12 0500)  Labs: Recent Labs    08/30/17 1051 08/30/17 1152 08/31/17 1258 08/31/17 1730 09/01/17 0509  HGB 13.4 13.6  --   --   --   HCT 41.7 40.0  --   --   --   PLT 150  --   --   --   --   APTT  --   --   --  28  --   LABPROT  --   --   --  15.3*  --   INR  --   --   --  1.22  --   HEPARINUNFRC  --   --   --   --  0.70  CREATININE 1.18 1.20 0.91  --   --   TROPONINI 2.51*  --  27.85*  --   --     Estimated Creatinine Clearance: 58.6 mL/min (by C-G formula based on SCr of 0.91 mg/dL).   Medical History: Past Medical History:  Diagnosis Date  . Angiodysplasia of intestine with hemorrhage    large and SB, gastric AVMs.   . Anxiety   . Arthritis    "left shoulder" (10/19/2014)  . Carotid artery occlusion   . Carotid artery stenosis 04/22/2012  . COPD GOLD III with min reversibilty  08/11/2006   Followed in Pulmonary clinic/ [facility]/ [facility] - PFT's 04/28/2013  FEV1 0.88 (40%) with ratio 44 and 14% better p B2 dlco 45 corrects to 83 - Trial of breo 04/28/2013 > improved symptoms  06/09/2013  - spirometry 06/04/2014 FEV1  0.76 (29%) ratio 45      . Coronary artery disease   . GERD 11/30/2008   Qualifier: Diagnosis of  By: Marijean Niemann CMA, Danielle    . GERD (gastroesophageal reflux disease)   . GI bleed 2010   4 units PRBCs  . History of blood transfusion "couple times"   "related to bleeding in colon and esophagus"  . Hx of adenomatous colonic polyps 2012, 2013.   Marland Kitchen Hyperlipidemia   . Hypertension   . Iron deficiency anemia 01/25/2009   Qualifier: Diagnosis of  By: Henrene Pastor MD, Docia Chuck   .  Irregular heartbeat   . On home oxygen therapy    "2L at night" (07/23/2015)  . Peripheral vascular disease ([place])   . Pleural plaque with presence of asbestos 03/27/2013   Followed in Pulmonary clinic/ [facility]/ [facility] - F/u CT 09/08/2013 1. Stable extensive calcified pleural plaque formation consistent with asbestos exposure. 2. Multiple pulmonary nodules are unchanged from the CT of 6 months ago. Given risk factors for lung cancer, continued follow up is recommended with chest CT in 6 months> done 04/20/14 no change >repeat in 12 m in tickle file     . Polyp of nasal cavity   . PVD (peripheral vascular disease) ([place]) 10/18/2012  . Rosacea   . 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 Shingles   . Tobacco abuse     Medications:  Scheduled:  . aspirin EC  81 mg Oral Daily  .  atorvastatin  40 mg Oral q1800  . carvedilol  3.125 mg Oral BID WC  .  HYDROmorphone (DILAUDID) injection  0.5 mg Intravenous Q4H  . ipratropium-albuterol  3 mL Nebulization Q6H  . lisinopril  2.5 mg Oral Daily  . mouth rinse  15 mL Mouth Rinse BID  . methylPREDNISolone (SOLU-MEDROL) injection  40 mg Intravenous Q8H   Infusions:  . azithromycin Stopped (08/31/17 1408)  . dextrose 5 % and 0.45% NaCl Stopped (08/31/17 1736)  . heparin 1,000 Units/hr (09/01/17 0400)   PRN: acetaminophen **OR** acetaminophen, ipratropium-albuterol, LORazepam, ondansetron **OR** ondansetron (ZOFRAN) IV  Assessment: 77 yo male with PMH of COPD, PAF not on anticoagulation, heart failure, aortic valve stenosis status post TaVR who was found unresponsive by his wife.  Pharmacy consulted to dose IV heparin for ACS (troponins 2.51 > 27.85).   Baseline PTT, PT/INR pending  CBC: Hgb 13.6, Plts 150  SCr 0.91, CrCl ~58 ml/min  No anticoagulations prior to admission Today, 6/12  0509 HL=0.70 at goal, no bleeding or infusion  issues per RN. Patient may become comfort care today, will schedule confirmatory lab later, as may not be necessary.   Goal of Therapy:  Heparin level 0.3-0.7 units/ml Monitor platelets by anticoagulation protocol: Yes   Plan:   Continue heparin drip at 1000 units/hr  Recheck HL in 8 hours  Daily heparin level and CBC   Lawana Pai R 09/01/2017,5:38 AM

## 2017-09-01 NOTE — Progress Notes (Signed)
This nurse provided report to transportation team.  Day nurse previously provided report to receiving facility. Family notified of patients departure.

## 2017-09-01 NOTE — Progress Notes (Signed)
PROGRESS NOTE    Ronald Bell  FTD:[id number] DOB: Jul 20, 1940 DOA: 08/30/2017 PCP: Ann Held, DO    Brief Narrative:  77 year old male who presented with dyspnea.  He does have a significant past medical history for COPD with chronic hypoxic respiratory failure, paroxysmal atrial fibrillation, systolic heart failure, aortic valve stenosis status post T aVR.  Patient was found unresponsive by his wife, EMS found him in significant distress, he was transported to the emergency department where he was placed on noninvasive mechanical ventilation.  On his initial physical examination his blood pressure 120/69, heart rate 100, respiratory 24, oxygen saturation 97% on BiPAP.  He was lethargic, dry mucous membranes, lungs with decreased breath sounds bilaterally, poor air movement, the abdomen was soft nontender, lower extremities with no edema, ecchymosis in his left foot.  Somnolent but able to follow simple commands.  Sodium 145, potassium 3.5, chloride 101, bicarb 29, glucose 167, BUN 25, creatinine 1.18, AST 169, ALT 153, troponin 2.5, arterial blood gas 7.24 /82.3 /400 /38 /oxygen saturation 100%. white cell count 19.6, hemoglobin 13.4, hematocrit 150.  Chest x-ray with hyperinflation, increased lung markings bilaterally no pleural effusions, EKG with sinus tachycardia, normal axis, normal intervals, poor R wave progression, lateral T wave inversions (new).   Patient was admitted to the hospital with working diagnosis of acute on chronic hypercapnic respiratory failure.    Assessment & Plan:   Active Problems:   Iron deficiency anemia   Essential hypertension   PVD (peripheral vascular disease) (HCC)   S/P TAVR (transcatheter aortic valve replacement)   Chronic respiratory failure with hypoxia and hypercapnia (HCC)   Acute on chronic respiratory failure (HCC)   Paroxysmal atrial fibrillation ([name])   Respiratory failure ([name])   1.  Acute hypercapnic respiratory failure due  to COPD exacerbation.  -Goal of care will be defined today.   -Patient was under the hospice team. -Discussed with patient's daughter and her husband, goal of care would definitely if it further care. -Guarded/poor prognosis. -Hospice/comfort care broached with daughter and son-in-law.  2. Systolic heart failure in the setting of aortic stenosis sp T aVR/ left ventricle ejection fraction 35 to 40%.  -Cardiology input is highly appreciated. -Comfort care recommended as well. -Troponin has decreased from 27 to 20.     3. Ischemic heart disease.  See above.   Patient is currently comfortable. No significant history from patient.  4. Paroxysmal atrial fibrillation.  5. Metabolic encephalopathy.  6. AKI/hypernatremia  DVT prophylaxis:    Code Status: DNR Family Communication: Daughter and son-in-law  Consultants:   Hospice team   Procedures:   Echo is pending  Antimicrobials:       Subjective: Patient is somnolent, unable to give detailed history.  Objective: Vitals:   09/01/17 0400 09/01/17 0500 09/01/17 0600 09/01/17 0800  BP: (!) 107/13 (!) 143/65 (!) 120/46   Pulse:  (!) 118 (!) 122   Resp: 20 20 (!) 21   Temp:    98.7 F (37.1 C)  TempSrc:    Axillary  SpO2: 94% 96% 93%   Weight:      Height:        Intake/Output Summary (Last 24 hours) at 09/01/2017 1241 Last data filed at 09/01/2017 0600 Gross per 24 hour  Intake 260.16 ml  Output 900 ml  Net -639.84 ml   Filed Weights   08/30/17 1108 08/31/17 1629  Weight: 68 kg (150 lb) 62.9 kg (138 lb 10.7 oz)    Examination:  General: Somnolent Neurology: somnolent  E ENT: positive pallor, no icterus, oral mucosa moist/ wide neck Cardiovascular: O9-G2, systolic murmur. Positive pedeal edema. Pulmonary: decreased breath sounds bilaterally, very poor air movement, no wheezing, rhonchi or rales. Gastrointestinal. Abdomen protuberant and distended, no organomegaly, non tender, no rebound or guarding Skin.  Left foot echymosis Musculoskeletal: no joint deformities     Data Reviewed: I have personally reviewed following labs and imaging studies  CBC: Recent Labs  Lab 08/30/17 1051 08/30/17 1152 09/01/17 0509  WBC 19.6*  --  15.5*  NEUTROABS 17.2*  --  14.6*  HGB 13.4 13.6 9.8*  HCT 41.7 40.0 29.5*  MCV 95.4  --  96.7  PLT 150  --  952   Basic Metabolic Panel: Recent Labs  Lab 08/30/17 1051 08/30/17 1152 08/31/17 1258 09/01/17 0509  NA 145 142 151* 151*  K 3.5 3.4* 3.8 3.9  CL 101 100* 104 109  CO2 29  --  32 32  GLUCOSE 167* 166* 113* 193*  BUN 25* 27* 21* 32*  CREATININE 1.18 1.20 0.91 0.78  CALCIUM 8.8*  --  9.0 8.1*   GFR: Estimated Creatinine Clearance: 66.6 mL/min (by C-G formula based on SCr of 0.78 mg/dL). Liver Function Tests: Recent Labs  Lab 08/30/17 1051  AST 169*  ALT 153*  ALKPHOS 45  BILITOT 0.9  PROT 6.6  ALBUMIN 3.5   No results for input(s): LIPASE, AMYLASE in the last 168 hours. No results for input(s): AMMONIA in the last 168 hours. Coagulation Profile: Recent Labs  Lab 08/31/17 1730  INR 1.22   Cardiac Enzymes: Recent Labs  Lab 08/30/17 1051 08/31/17 1258 09/01/17 0509  TROPONINI 2.51* 27.85* 20.85*   BNP (last 3 results) No results for input(s): PROBNP in the last 8760 hours. HbA1C: No results for input(s): HGBA1C in the last 72 hours. CBG: Recent Labs  Lab 08/30/17 1101  GLUCAP 169*   Lipid Profile: No results for input(s): CHOL, HDL, LDLCALC, TRIG, CHOLHDL, LDLDIRECT in the last 72 hours. Thyroid Function Tests: No results for input(s): TSH, T4TOTAL, FREET4, T3FREE, THYROIDAB in the last 72 hours. Anemia Panel: No results for input(s): VITAMINB12, FOLATE, FERRITIN, TIBC, IRON, RETICCTPCT in the last 72 hours.    Radiology Studies: I have reviewed all of the imaging during this hospital visit personally     Scheduled Meds: . atorvastatin  40 mg Oral q1800  . carvedilol  3.125 mg Oral BID WC  .   HYDROmorphone (DILAUDID) injection  0.5 mg Intravenous Q4H  . ipratropium-albuterol  3 mL Nebulization Q6H  . lisinopril  2.5 mg Oral Daily  . mouth rinse  15 mL Mouth Rinse BID  . methylPREDNISolone (SOLU-MEDROL) injection  40 mg Intravenous Q8H  . metoprolol tartrate  5 mg Intravenous Once  . pantoprazole (PROTONIX) IV  40 mg Intravenous Q24H   Continuous Infusions: . azithromycin Stopped (08/31/17 1408)  . dextrose 5 % and 0.45% NaCl Stopped (08/31/17 1736)     LOS: 2 days        Bonnell Public, MD [facility] Pager [phone].

## 2017-09-01 NOTE — Consult Note (Signed)
Cardiology Consult    Patient ID: Ronald Bell MRN: [id number], DOB/AGE: Mar 03, 1941   Admit date: 08/30/2017 Date of Consult: 09/01/2017  Primary Physician: Carollee Herter, Alferd Apa, DO Primary Cardiologist: Curt Jews, MD Requesting Provider: Arrien  Patient Profile    Ronald Bell is a 77 y.o. male with a history of severe aortic stenosis and CAD , who is being seen today for the evaluation of elevated troponin at the request of Dr. Cathlean Sauer  Past Medical History   Past Medical History:  Diagnosis Date  . Angiodysplasia of intestine with hemorrhage    large and SB, gastric AVMs.   . Anxiety   . Arthritis    "left shoulder" (10/19/2014)  . Carotid artery occlusion   . Carotid artery stenosis 04/22/2012  . COPD GOLD III with min reversibilty  08/11/2006   Followed in Pulmonary clinic/ [facility]/ [place] - PFT's 04/28/2013  FEV1 0.88 (40%) with ratio 44 and 14% better p B2 dlco 45 corrects to 83 - Trial of breo 04/28/2013 > improved symptoms  06/09/2013  - spirometry 06/04/2014 FEV1  0.76 (29%) ratio 45      . Coronary artery disease   . GERD 11/30/2008   Qualifier: Diagnosis of  By: Marijean Niemann CMA, Danielle    . GERD (gastroesophageal reflux disease)   . GI bleed 2010   4 units PRBCs  . History of blood transfusion "couple times"   "related to bleeding in colon and esophagus"  . Hx of adenomatous colonic polyps 2012, 2013.   Marland Kitchen Hyperlipidemia   . Hypertension   . Iron deficiency anemia 01/25/2009   Qualifier: Diagnosis of  By: Henrene Pastor MD, Docia Chuck   . Irregular heartbeat   . On home oxygen therapy    "2L at night" (07/23/2015)  . Peripheral vascular disease ([place])   . Pleural plaque with presence of asbestos 03/27/2013   Followed in Pulmonary clinic/ [facility]/ [facility] - F/u CT 09/08/2013 1. Stable extensive calcified pleural plaque formation consistent with asbestos exposure. 2. Multiple pulmonary nodules are unchanged from the CT of 6 months ago. Given risk factors for  lung cancer, continued follow up is recommended with chest CT in 6 months> done 04/20/14 no change >repeat in 12 m in tickle file     . Polyp of nasal cavity   . PVD (peripheral vascular disease) ([place]) 10/18/2012  . Rosacea   . 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 Shingles   . Tobacco abuse     Past Surgical History:  Procedure Laterality Date  . CARDIAC CATHETERIZATION  2001; 06/28/2014  . CARDIAC CATHETERIZATION N/A 10/19/2014   Procedure: Coronary Stent Intervention;  Surgeon: Burnell Blanks, MD;  Location: [place] [facility];  Service: Cardiovascular;  Laterality: N/A;  BMS Mid RCA  . COLONOSCOPY  July 2015   Dr. Henrene Pastor  . COLONOSCOPY N/A 08/17/2014   Procedure: COLONOSCOPY;  Surgeon: Jerene Bears, MD;  Location: [facility] ENDOSCOPY;  Service: Endoscopy;  Laterality: N/A;  . COLONOSCOPY N/A 12/05/2014   Procedure: COLONOSCOPY;  Surgeon: Manus Gunning, MD;  Location: [place];  Service: Gastroenterology;  Laterality: N/A;  . ENTEROSCOPY N/A 12/05/2014   Procedure: ENTEROSCOPY;  Surgeon: Manus Gunning, MD;  Location: [facility] ENDOSCOPY;  Service: Gastroenterology;  Laterality: N/A;  . ESOPHAGOGASTRODUODENOSCOPY  2012   normal  . ESOPHAGOGASTRODUODENOSCOPY N/A 08/17/2014   Procedure: ESOPHAGOGASTRODUODENOSCOPY (EGD);  Surgeon: Ulice Dash  Everitt Amber, MD;  Location: [place];  Service: Endoscopy;  Laterality: N/A;  . ILIAC ARTERY STENT Left 2005   CIA  . KNEE ARTHROSCOPY WITH MEDIAL MENISECTOMY Left 03/08/2014   Procedure: LEFT KNEE SCOPE WITH MEDIAL MENISECTOMY AND CHONDROPLASTY;  Surgeon: Ninetta Lights, MD;  Location: [place];  Service: Orthopedics;  Laterality: Left;  . LEFT AND RIGHT HEART CATHETERIZATION WITH CORONARY ANGIOGRAM N/A 06/28/2014   Procedure: LEFT AND RIGHT HEART CATHETERIZATION WITH CORONARY ANGIOGRAM;  Surgeon: Jerline Pain, MD;  Location: [facility] CATH LAB;   Service: Cardiovascular;  Laterality: N/A;  . RIB PLATING Left 11/20/2014   Procedure: RIB PLATING OF LEFT 8TH RIB;  Surgeon: [name], MD;  Location: [place];  Service: Open Heart Surgery;  Laterality: Left;  . RIGHT/LEFT HEART CATH AND CORONARY ANGIOGRAPHY N/A 02/05/2017   Procedure: RIGHT/LEFT HEART CATH AND CORONARY ANGIOGRAPHY;  Surgeon: Sherren Mocha, MD;  Location: [place] [facility];  Service: Cardiovascular;  Laterality: N/A;  . TEE WITHOUT CARDIOVERSION N/A 11/20/2014   Procedure: TRANSESOPHAGEAL ECHOCARDIOGRAM (TEE);  Surgeon: [name], MD;  Location: [place];  Service: Open Heart Surgery;  Laterality: N/A;  . TEE WITHOUT CARDIOVERSION N/A 12/19/2015   Procedure: TRANSESOPHAGEAL ECHOCARDIOGRAM (TEE);  Surgeon: Jerline Pain, MD;  Location: [facility] ENDOSCOPY;  Service: Cardiovascular;  Laterality: N/A;  . TONSILLECTOMY    . TRANSCATHETER AORTIC VALVE REPLACEMENT, TRANSAPICAL N/A 11/20/2014   Procedure: TRANSCATHETER AORTIC VALVE REPLACEMENT, TRANSAPICAL;  Surgeon: [name], MD;  Location: [place];  Service: Open Heart Surgery;  Laterality: N/A;     Allergies  No Known Allergies  History of Present Illness   Pt is a 77 yo who is a pt of Derl Barrow (last seen in March 2018 in clinic)   Hx of Aortic stenossi  (s/p TAVR), PAF, CAD, GI bleeding with melena in past due to AVMs. Pt presented in November 2018 with pulmonary edemam COPD exacerbation and elevated troponin.   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 aspirin EC  81 mg Oral  Daily  . atorvastatin  40 mg Oral q1800  . carvedilol  3.125 mg Oral BID WC  .  HYDROmorphone (DILAUDID) injection  0.5 mg Intravenous Q4H  . ipratropium-albuterol  3 mL Nebulization Q6H  . lisinopril  2.5 mg Oral Daily  . mouth rinse  15 mL Mouth Rinse BID  . methylPREDNISolone (SOLU-MEDROL) injection  40 mg Intravenous Q8H  . metoprolol tartrate  5 mg Intravenous Once    Family History    Family History  Problem Relation Age of Onset  . Lung disease Mother        pulm fibrosis  . Colitis Father   . Heart disease Brother   . Hypertension Brother   . Hyperlipidemia Brother   . CAD Daughter  cad  . Hypertension Son   . Colon cancer Neg Hx    indicated that his mother is deceased. He indicated that his father is deceased. He indicated that his sister is alive. He indicated that his brother is alive. He indicated that his maternal grandmother is deceased. He indicated that his maternal grandfather is deceased. He indicated that his paternal grandmother is deceased. He indicated that his paternal grandfather is deceased. He indicated that his daughter is deceased. He indicated that only one of his two sons is alive. He indicated that the status of his neg hx is unknown.   Social History    Social History   Socioeconomic History  . Marital status: Married    Spouse name: Not on file  . Number of children: 4  . Years of education: Not on file  . Highest education level: Not on file  Occupational History  . Occupation: Oncologist: [employer]  . Financial resource strain: Not on file  . Food insecurity:    Worry: Not on file    Inability: Not on file  . Transportation needs:    Medical: Not on file    Non-medical: Not on file  Tobacco Use  . Smoking status: Former Smoker    Packs/day: 1.00    Years: 60.00    Pack years: 60.00    Types: Cigarettes, E-cigarettes    Last attempt to quit: 04/18/2015    Years since  quitting: 2.3  . Smokeless tobacco: Never Used  Substance and Sexual Activity  . Alcohol use: Yes    Alcohol/week: 7.2 oz    Types: 12 Cans of beer per week  . Drug use: No  . Sexual activity: Never    Partners: Female  Lifestyle  . Physical activity:    Days per week: Not on file    Minutes per session: Not on file  . Stress: Not on file  Relationships  . Social connections:    Talks on phone: Not on file    Gets together: Not on file    Attends religious service: Not on file    Active member of club or organization: Not on file    Attends meetings of clubs or organizations: Not on file    Relationship status: Not on file  . Intimate partner violence:    Fear of current or ex partner: Not on file    Emotionally abused: Not on file    Physically abused: Not on file    Forced sexual activity: Not on file  Other Topics Concern  . Not on file  Social History Narrative  . Not on file     Review of Systems    Not obtainable as pt not responsive   Physical Exam    Blood pressure (!) 120/46, pulse (!) 122, temperature 98.7 F (37.1 C), temperature source Axillary, resp. rate (!) 21, height 5\' 7"  (1.702 m), weight 62.9 kg (138 lb 10.7 oz), SpO2 93 %.  General: Pt sleeping   Wakes with name but does not answer  [place]   Agitated at times  . HEENT: Normal  Neck: Neck is full   Unable to assess JVP   Lungs:  Rhonchi bilaterally   Heart: RRR Tachy   No 3, s4, Gr I/VI systolic murmur  Abdomen: Soft, non-tender,Decreased BS  Extremities  2+ edema in feet/legs   Bruising on top of R foot   Labs    Troponin [facility]  of Care Test) Recent Labs    08/30/17 1124  TROPIPOC 3.02*   Recent Labs    08/30/17 1051 08/31/17 1258 09/01/17 0509  TROPONINI 2.51* 27.85* 20.85*   Lab Results  Component Value Date   WBC 15.5 (H) 09/01/2017   HGB 9.8 (L) 09/01/2017   HCT 29.5 (L) 09/01/2017   MCV 96.7 09/01/2017   PLT 158 09/01/2017    Recent Labs  Lab 08/30/17 1051   09/01/17 0509  NA 145   < > 151*  K 3.5   < > 3.9  CL 101   < > 109  CO2 29   < > 32  BUN 25*   < > 32*  CREATININE 1.18   < > 0.78  CALCIUM 8.8*   < > 8.1*  PROT 6.6  --   --   BILITOT 0.9  --   --   ALKPHOS 45  --   --   ALT 153*  --   --   AST 169*  --   --   GLUCOSE 167*   < > 193*   < > = values in this interval not displayed.   Lab Results  Component Value Date   CHOL 203 (H) 11/24/2016   HDL 59.90 11/24/2016   LDLCALC 89 07/14/2016   TRIG 205.0 (H) 11/24/2016   Lab Results  Component Value Date   DDIMER 0.76 (H) 05/21/2015     Radiology Studies    Dg Chest Portable 1 View  Result Date: 08/30/2017 CLINICAL DATA:  Altered mental status and dyspnea is morning. EXAM: PORTABLE CHEST 1 VIEW COMPARISON:  Single-view of the chest 02/01/2017. CT chest 07/23/2015. FINDINGS: Calcified pleural plaques are again seen. The lungs are markedly emphysematous with mild basilar atelectasis. No pneumothorax or pleural fluid. Heart size is upper normal. Aortic atherosclerosis is noted. IMPRESSION: No acute disease. Emphysema. Calcified pleural plaques. Atherosclerosis. Electronically Signed   By: Inge Rise M.D.   On: 08/30/2017 11:16    ECG & Cardiac Imaging    On 6/10:  ST 113 bpm   ST depression in V5, V6  (20mm)    Assessment & Plan    Unfortunate 77 yo with history of CAD, COPD, AS (s/p TAVR) and GI bleeding Admitted yesterday after being found unresponsive      1.  NSTEMI   Pt with signif elevation of trop (27 so far),   Echo pending   EKG is not diagnostic   Has known CAD    Also severe AS   May represent injury from poor oxygenation and poor perfusion.     Pt was started on ASA and Heparin by hsopitalist   Unfortunately Hgb has dropped  I would d/c   Would not resume anticoaguation     Hold ASA for now   With signif drop in Hgb With medical problems no option for Rx other than comfort care WIll review meds   He is not taking PO   May try low dose meds IV but unclear  on goals (in setting of risks)  Tachycardia probably in respnse to anemai    2  Aortic Stenosis   Pt is s/p TAVR   Last falll found to have high gradients and prosthetic thrombosis   Pt declined anticoaguation at that time    No other options for Rx other than comfort care   His presentation reflects probable end stages of disease process  Echo was done  Will review  3  GI   Pt now with probable melena  Stop plt agents and anticoagulants   4  Palliation.  Pt reportedly under service of hospice/palliative care at home  Service has meeting with family   I have spoken to daughter and her husband at length about the physiology of the medical problems     Support comfort care   They also are in agreement. Wife not present at time.   Signed, Dorris Carnes, MD 09/01/2017, 10:46 AM  For questions or updates, please contact   Please consult [email or web address] for contact info under Cardiology/STEMI.

## 2017-09-01 NOTE — Progress Notes (Signed)
Palliative Medicine RN Note: I have communicated with [facility]. He remains under their care as a GIP admission. When patients are admitted as GIP, the hospice provider addresses goals of care. Due to the very complex family dynamics and the hospice's familiarity with the family, PMT will decline further involvement in that discussion.   At this time, his symptoms appear managed, and he is being monitored by the hospice team. Unfortunately, if his wife remains focused on life-prolonging care, such as continuing bipap and antibiotics, aggressive symptom management is limited due to the potential for impact on respiratory status.  At this time, per my discussion with the hospice liason Cheri, PMT will step back from involvement in Ronald Bell care. Cheri will contact our team should changes arise. If his symptoms become uncontrolled, please call our office at [phone] from 7am-7pm, and we can help with recommendations until we have an available provider.  Marjie Skiff Sheril Hammond, RN, BSN, [facility] Palliative Medicine Team 09/01/2017 1:37 PM Office [phone]

## 2017-09-01 NOTE — Discharge Summary (Addendum)
Physician Discharge Summary  Patient ID: Ronald Bell MRN: [id number] DOB/AGE: 1940/04/27 77 y.o.  Admit date: 08/30/2017 Discharge date: 09/01/2017  Admission Diagnoses:  Discharge Diagnoses:  Active Problems:  Acute on chronic respiratory failure (HCC)  Iron deficiency anemia   Essential hypertension   PVD (peripheral vascular disease) (HCC)   S/P TAVR (transcatheter aortic valve replacement)   Chronic respiratory failure with hypoxia and hypercapnia (HCC)     Paroxysmal atrial fibrillation (HCC)   Respiratory failure (HCC) Elevated troponin NSTEMI   Discharged Condition: serious  Hospital Course: Patient is a 77 year old male, admitted with dyspnea.  He does have past medical history significant for COPD with chronic hypoxic respiratory failure, paroxysmal atrial fibrillation, systolic heart failure, aortic valve stenosis status post T aVR.  Patient was found unresponsive by his wife, EMS found him in significant distress, he was transported to the emergency department where he was placed on noninvasive mechanical ventilation.  On his initial physical examination his blood pressure 120/69, heart rate 100, respiratory 24, oxygen saturation 97% on BiPAP.  He was lethargic, dry mucous membranes, lungs with decreased breath sounds bilaterally, poor air movement, the abdomen was soft nontender, lower extremities with no edema, ecchymosis in his left foot.  Somnolent but able to follow simple commands.  Sodium 145, potassium 3.5, chloride 101, bicarb 29, glucose 167, BUN 25, creatinine 1.18, AST 169, ALT 153, troponin 2.5, arterial blood gas 7.24 /82.3 /400 /38 /oxygen saturation 100%. white cell count 19.6, hemoglobin 13.4, hematocrit 150.  Chest x-ray with hyperinflation, increased lung markings bilaterally no pleural effusions, EKG with sinus tachycardia, normal axis, normal intervals, poor R wave progression, lateral T wave inversions (new).  Troponin peaked at 27.  Patient was seen by  the cardiology team.  Comfort directed care was recommended.  1.  Acute hypercapnic respiratory failure due to COPD exacerbation.  -Goal of care defined.  The patient will be discharged to hospice for comfort measures only.    -Patient was under the hospice team prior to admission. -Discussed with patient's daughter and her husband. -Guarded/poor prognosis. -Hospice/comfort care broached with daughter and son-in-law.  2. Systolic heart failure in the setting of aortic stenosis sp T aVR/ left ventricle ejection fraction 35 to 40%.  -Cardiology input is highly appreciated. -Comfort care recommended as well. -Troponin has decreased from 27 to 20.     3. Ischemic heart disease.  See above.   Patient is currently comfortable. No significant history from patient.  4. Paroxysmal atrial fibrillation.  5. Metabolic encephalopathy.  6. AKI/hypernatremia  Consults: cardiology  Discharge Exam: Blood pressure (!) 120/46, pulse (!) 122, temperature 98.7 F (37.1 C), temperature source Axillary, resp. rate (!) 21, height 5\' 7"  (1.702 m), weight 62.9 kg (138 lb 10.7 oz), SpO2 93 %.   Disposition: Discharge disposition: 50-Hospice/Home       Discharge Instructions    Diet general   Complete by:  As directed      Allergies as of 09/01/2017   No Known Allergies     Medication List    STOP taking these medications   albuterol 108 (90 Base) MCG/ACT inhaler Commonly known as:  PROVENTIL HFA;VENTOLIN HFA   ALPRAZolam 0.5 MG tablet Commonly known as:  XANAX   aspirin 81 MG tablet   atorvastatin 40 MG tablet Commonly known as:  LIPITOR   benzonatate 100 MG capsule Commonly known as:  TESSALON   bisoprolol 5 MG tablet Commonly known as:  ZEBETA   budesonide-formoterol 160-4.5 MCG/ACT inhaler  Commonly known as:  SYMBICORT   CENTRUM SILVER PO   citalopram 20 MG tablet Commonly known as:  CELEXA   docusate sodium 100 MG capsule Commonly known as:  COLACE    famotidine 20 MG tablet Commonly known as:  PEPCID   fenofibrate 532 MG tablet   folic acid 1 MG tablet Commonly known as:  FOLVITE   furosemide 20 MG tablet Commonly known as:  LASIX   guaiFENesin 600 MG 12 hr tablet Commonly known as:  MUCINEX   ipratropium-albuterol 0.5-2.5 (3) MG/3ML Soln Commonly known as:  DUONEB   Iron 325 (65 Fe) MG Tabs   morphine 10 MG/5ML solution   morphine 20 MG/ML concentrated solution Commonly known as:  ROXANOL   OXYGEN   pantoprazole 40 MG tablet Commonly known as:  PROTONIX   sennosides-docusate sodium 8.6-50 MG tablet Commonly known as:  SENOKOT-S   sodium chloride 0.65 % Soln nasal spray Commonly known as:  OCEAN   temazepam 30 MG capsule Commonly known as:  RESTORIL   thiamine 100 MG tablet   vitamin C 500 MG tablet Commonly known as:  ASCORBIC ACID        Signed: Bonnell Public 09/01/2017, 2:53 PM

## 2017-09-02 ENCOUNTER — Telehealth: Payer: Self-pay | Admitting: *Deleted

## 2017-09-02 ENCOUNTER — Telehealth: Payer: Self-pay | Admitting: Family Medicine

## 2017-09-02 LAB — URINE CULTURE
Culture: NO GROWTH
Special Requests: NORMAL

## 2017-09-20 NOTE — Telephone Encounter (Signed)
Received Physician Orders from [place]; forwarded to provider/SLS 06/13

## 2017-09-20 NOTE — Telephone Encounter (Unsigned)
Copied from [place] [id number]. Topic: General - Other >> 2017-09-04 10:19 AM Carolyn Stare wrote:  Cris with [facility] call to report pt pass away this morning 9:20 am   [phone]

## 2017-09-20 DEATH — deceased

## 2017-10-28 IMAGING — CT CT CHEST W/O CM
2 of 3 series · 15 of 36 positions shown, 18 images · non-contrast
Comparison: CT 07/09/2014, 09/08/2013

CLINICAL DATA: Followup pulmonary nodules.

EXAM:
CT CHEST WITHOUT CONTRAST
TECHNIQUE: Multidetector CT imaging of the chest was performed following the
standard protocol without IV contrast.

[Series 2: thorax · axial · 0.76mm/px · z∈[+928,+1178]mm · 12 of 60 slices shown, 15 images]
[im 5/60  mediastinal]
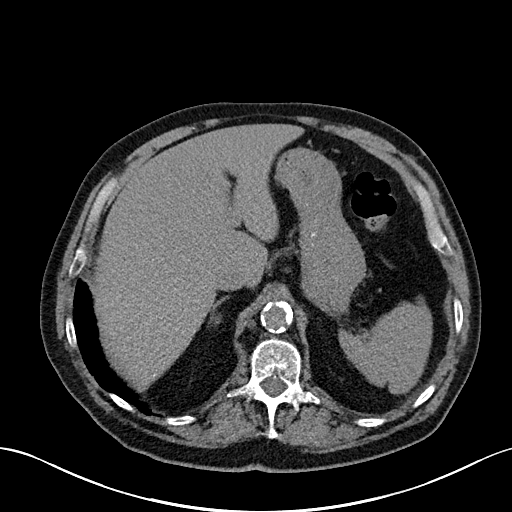
[im 5/60  lung]
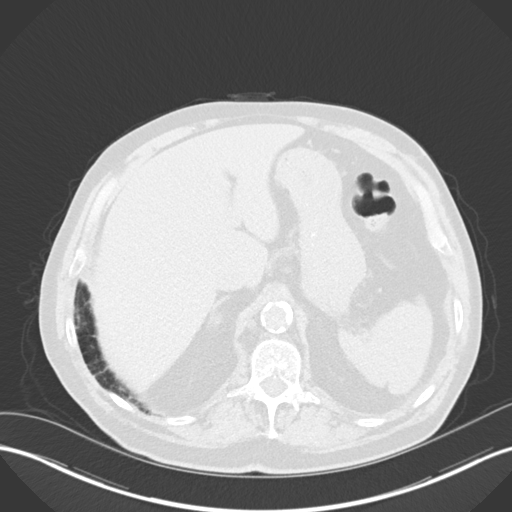
[im 9/60  lung]
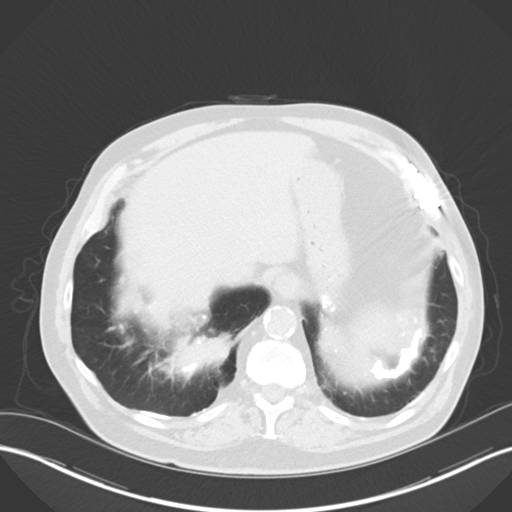
[im 14/60  lung]
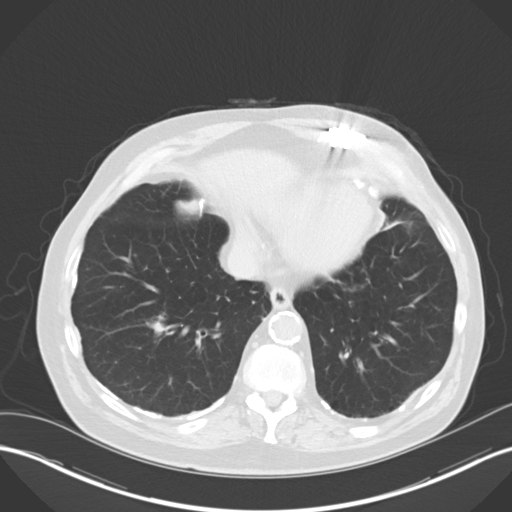
[im 18/60  lung]
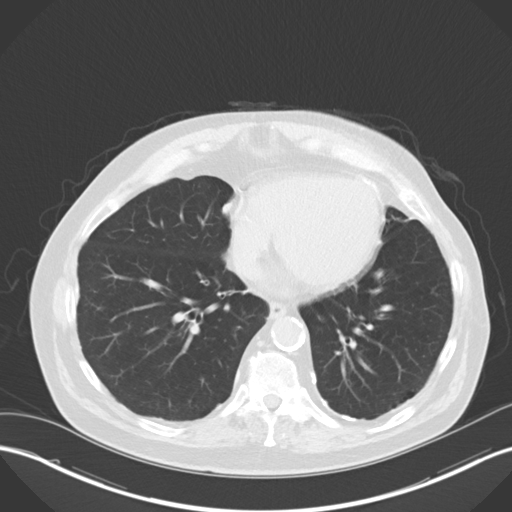
[im 22/60  mediastinal]
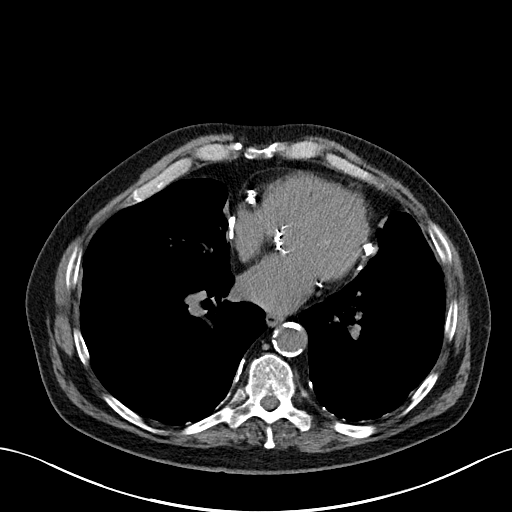
[im 22/60  lung]
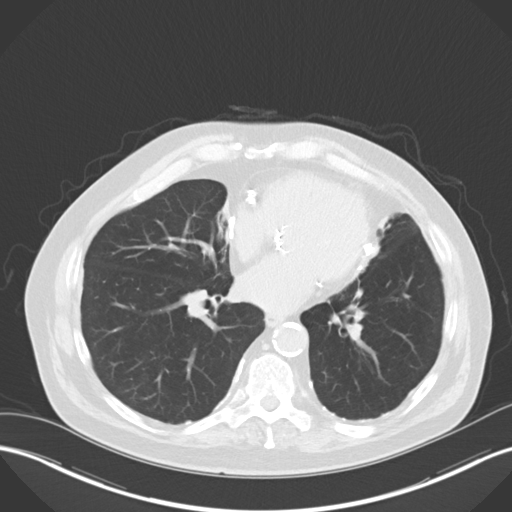
[im 27/60  lung]
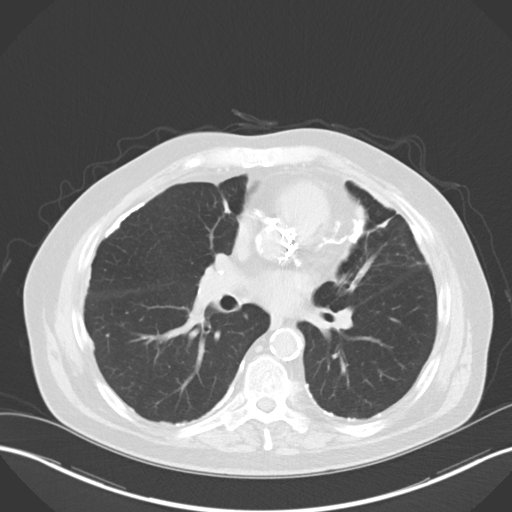
[im 33/60  lung]
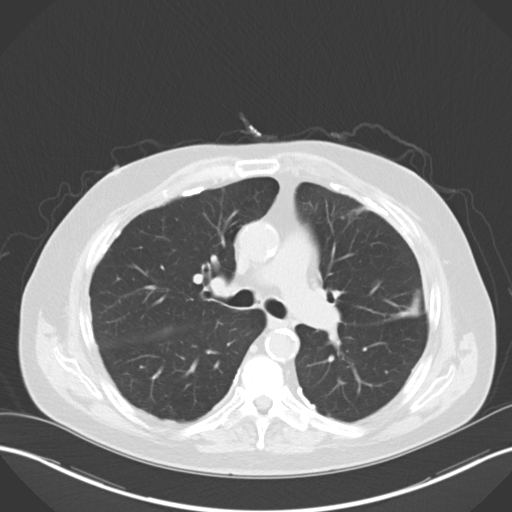
[im 38/60  lung]
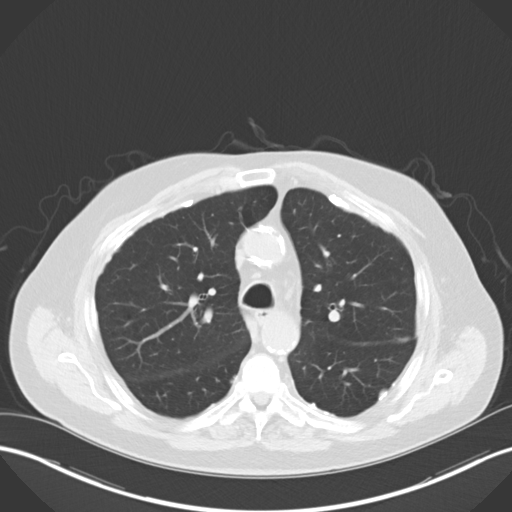
[im 42/60  mediastinal]
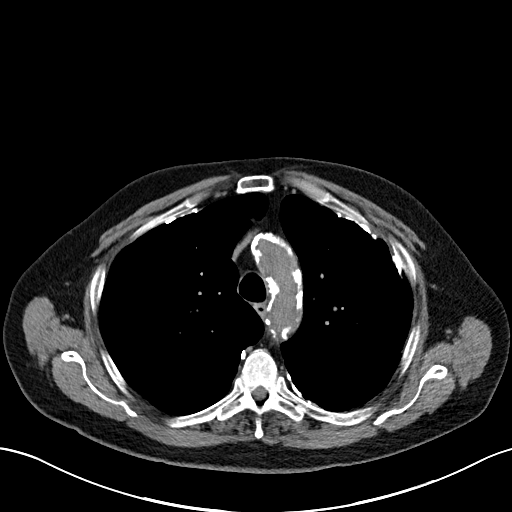
[im 42/60  lung]
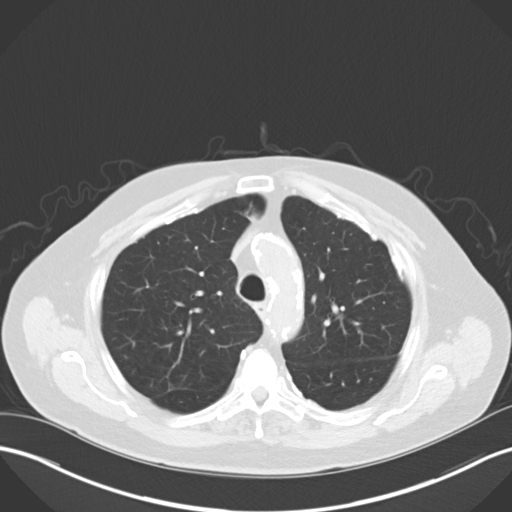
[im 46/60  lung]
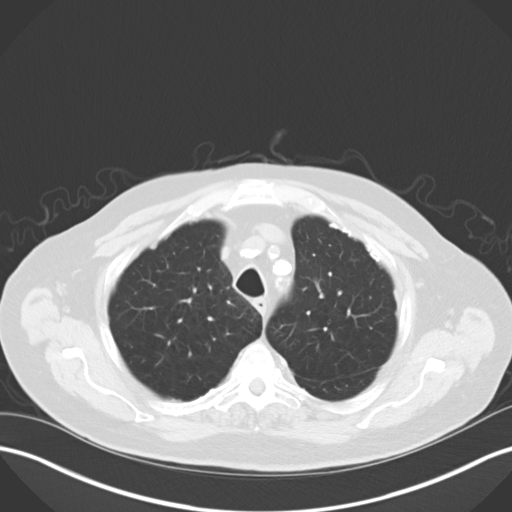
[im 51/60  lung]
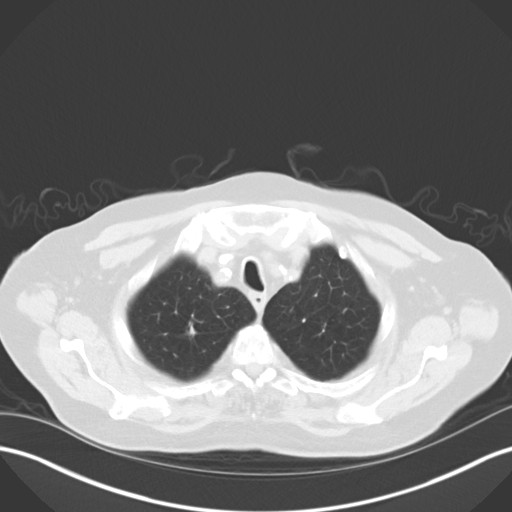
[im 55/60  lung]
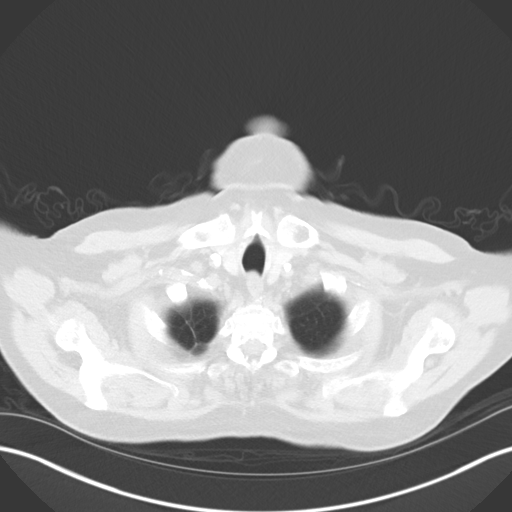

[Series 5: coronal · coronal · 0.59mm/px · 3 of 129 slices shown]
[im 26/129  lung]
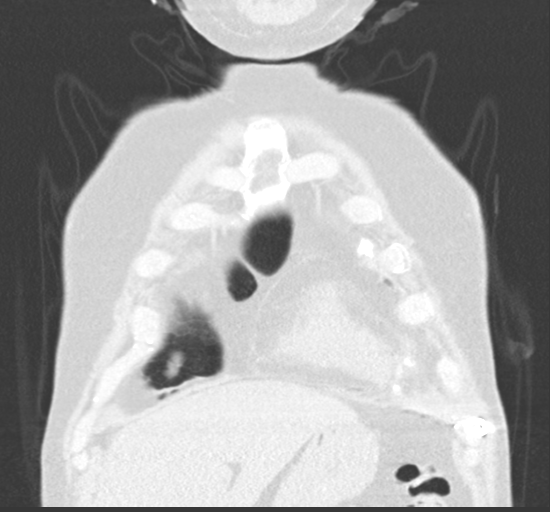
[im 52/129  lung]
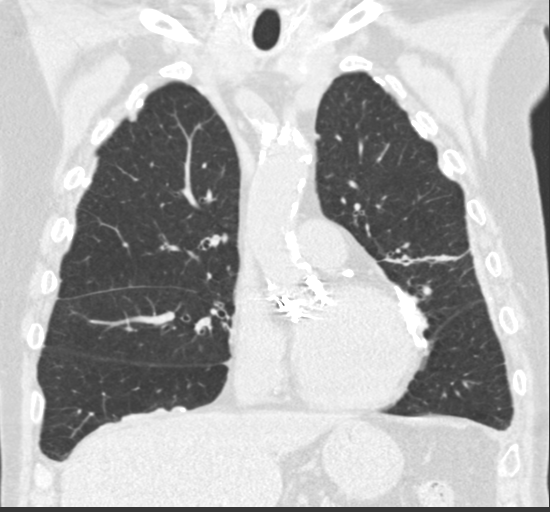
[im 77/129  lung]
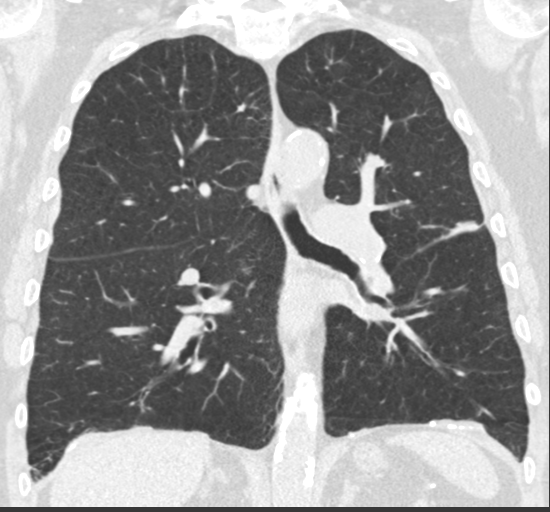

[15 of 36 positions shown; findings below may reference images not displayed]

FINDINGS: Mediastinum/Nodes: No axillary or supraclavicular lymphadenopathy.
No mediastinal hilar adenopathy. No pericardial fluid. Valvular
replacement noted. Esophagus normal.

Lungs/Pleura: Bilateral scattered pulmonary nodular again
demonstrated not changed in size or number. RIGHT upper lobe nodule
measures 7 mm (image 9, series 3) not changed from prior. RIGHT
upper lobe nodule measures 5 mm on image 16, unchanged. Nodules in
the medial LEFT and RIGHT upper lobe on image 14, are unchanged. No
new pulmonary nodules.

There are bilateral pleural plaques which are partially calcified
and not changed from prior.

Upper abdomen: Normal adrenal glands are partially imaged. No Acute
findings

Musculoskeletal: No aggressive osseous lesion.
IMPRESSION: 1. Stable bilateral pulmonary nodules over multiple comparison exams
suggesting benign noncalcified granulomas.
2. Stable pleural plaques which are partially calcified.

## 2022-06-03 NOTE — Telephone Encounter (Signed)
done
